# Patient Record
Sex: Male | Born: 1935 | Race: White | Hispanic: No | Smoking: Former smoker
Health system: Southern US, Community
[De-identification: ages and names within clinical notes are randomized; demographics above are authoritative.]

## PROBLEM LIST (undated history)

## (undated) DIAGNOSIS — F329 Major depressive disorder, single episode, unspecified: Secondary | ICD-10-CM

## (undated) DIAGNOSIS — I69328 Other speech and language deficits following cerebral infarction: Secondary | ICD-10-CM

## (undated) DIAGNOSIS — E785 Hyperlipidemia, unspecified: Secondary | ICD-10-CM

## (undated) DIAGNOSIS — R413 Other amnesia: Secondary | ICD-10-CM

## (undated) DIAGNOSIS — I639 Cerebral infarction, unspecified: Secondary | ICD-10-CM

## (undated) DIAGNOSIS — C44319 Basal cell carcinoma of skin of other parts of face: Secondary | ICD-10-CM

## (undated) DIAGNOSIS — M1990 Unspecified osteoarthritis, unspecified site: Secondary | ICD-10-CM

## (undated) DIAGNOSIS — R942 Abnormal results of pulmonary function studies: Secondary | ICD-10-CM

## (undated) DIAGNOSIS — L299 Pruritus, unspecified: Secondary | ICD-10-CM

## (undated) DIAGNOSIS — F4323 Adjustment disorder with mixed anxiety and depressed mood: Secondary | ICD-10-CM

## (undated) DIAGNOSIS — M4802 Spinal stenosis, cervical region: Secondary | ICD-10-CM

## (undated) DIAGNOSIS — I480 Paroxysmal atrial fibrillation: Secondary | ICD-10-CM

## (undated) DIAGNOSIS — I701 Atherosclerosis of renal artery: Secondary | ICD-10-CM

## (undated) DIAGNOSIS — N529 Male erectile dysfunction, unspecified: Secondary | ICD-10-CM

## (undated) DIAGNOSIS — M25569 Pain in unspecified knee: Secondary | ICD-10-CM

## (undated) DIAGNOSIS — E1122 Type 2 diabetes mellitus with diabetic chronic kidney disease: Secondary | ICD-10-CM

## (undated) DIAGNOSIS — Z9289 Personal history of other medical treatment: Secondary | ICD-10-CM

## (undated) DIAGNOSIS — I4510 Unspecified right bundle-branch block: Secondary | ICD-10-CM

## (undated) DIAGNOSIS — I4891 Unspecified atrial fibrillation: Secondary | ICD-10-CM

## (undated) DIAGNOSIS — I10 Essential (primary) hypertension: Secondary | ICD-10-CM

## (undated) DIAGNOSIS — M6281 Muscle weakness (generalized): Secondary | ICD-10-CM

## (undated) DIAGNOSIS — W19XXXA Unspecified fall, initial encounter: Secondary | ICD-10-CM

## (undated) DIAGNOSIS — N182 Chronic kidney disease, stage 2 (mild): Secondary | ICD-10-CM

## (undated) DIAGNOSIS — E871 Hypo-osmolality and hyponatremia: Secondary | ICD-10-CM

## (undated) DIAGNOSIS — E039 Hypothyroidism, unspecified: Secondary | ICD-10-CM

## (undated) DIAGNOSIS — M109 Gout, unspecified: Secondary | ICD-10-CM

## (undated) DIAGNOSIS — Z7901 Long term (current) use of anticoagulants: Secondary | ICD-10-CM

## (undated) DIAGNOSIS — S81809A Unspecified open wound, unspecified lower leg, initial encounter: Secondary | ICD-10-CM

## (undated) DIAGNOSIS — G459 Transient cerebral ischemic attack, unspecified: Secondary | ICD-10-CM

## (undated) DIAGNOSIS — F419 Anxiety disorder, unspecified: Secondary | ICD-10-CM

## (undated) DIAGNOSIS — E079 Disorder of thyroid, unspecified: Secondary | ICD-10-CM

## (undated) DIAGNOSIS — G4733 Obstructive sleep apnea (adult) (pediatric): Secondary | ICD-10-CM

## (undated) DIAGNOSIS — M170 Bilateral primary osteoarthritis of knee: Secondary | ICD-10-CM

## (undated) DIAGNOSIS — I495 Sick sinus syndrome: Secondary | ICD-10-CM

## (undated) DIAGNOSIS — R5383 Other fatigue: Secondary | ICD-10-CM

## (undated) DIAGNOSIS — E1129 Type 2 diabetes mellitus with other diabetic kidney complication: Secondary | ICD-10-CM

## (undated) DIAGNOSIS — R2689 Other abnormalities of gait and mobility: Secondary | ICD-10-CM

## (undated) DIAGNOSIS — R269 Unspecified abnormalities of gait and mobility: Secondary | ICD-10-CM

## (undated) DIAGNOSIS — K648 Other hemorrhoids: Secondary | ICD-10-CM

## (undated) DIAGNOSIS — I5023 Acute on chronic systolic (congestive) heart failure: Secondary | ICD-10-CM

## (undated) DIAGNOSIS — E559 Vitamin D deficiency, unspecified: Secondary | ICD-10-CM

## (undated) DIAGNOSIS — S91009A Unspecified open wound, unspecified ankle, initial encounter: Secondary | ICD-10-CM

## (undated) DIAGNOSIS — T7840XA Allergy, unspecified, initial encounter: Secondary | ICD-10-CM

## (undated) DIAGNOSIS — R011 Cardiac murmur, unspecified: Secondary | ICD-10-CM

## (undated) DIAGNOSIS — Z8679 Personal history of other diseases of the circulatory system: Secondary | ICD-10-CM

## (undated) DIAGNOSIS — Z950 Presence of cardiac pacemaker: Secondary | ICD-10-CM

## (undated) DIAGNOSIS — R351 Nocturia: Secondary | ICD-10-CM

## (undated) DIAGNOSIS — S81009A Unspecified open wound, unspecified knee, initial encounter: Secondary | ICD-10-CM

## (undated) DIAGNOSIS — N401 Enlarged prostate with lower urinary tract symptoms: Secondary | ICD-10-CM

## (undated) DIAGNOSIS — Z5189 Encounter for other specified aftercare: Secondary | ICD-10-CM

## (undated) DIAGNOSIS — E507 Other ocular manifestations of vitamin A deficiency: Secondary | ICD-10-CM

## (undated) DIAGNOSIS — R5381 Other malaise: Secondary | ICD-10-CM

## (undated) DIAGNOSIS — I719 Aortic aneurysm of unspecified site, without rupture: Secondary | ICD-10-CM

## (undated) DIAGNOSIS — I2510 Atherosclerotic heart disease of native coronary artery without angina pectoris: Secondary | ICD-10-CM

## (undated) DIAGNOSIS — E119 Type 2 diabetes mellitus without complications: Secondary | ICD-10-CM

## (undated) DIAGNOSIS — H269 Unspecified cataract: Secondary | ICD-10-CM

## (undated) HISTORY — DX: Cerebral infarction, unspecified: I63.9

## (undated) HISTORY — DX: Abnormal results of pulmonary function studies: R94.2

## (undated) HISTORY — DX: Unspecified fall, initial encounter: W19.XXXA

## (undated) HISTORY — DX: Unspecified right bundle-branch block: I45.10

## (undated) HISTORY — DX: Unspecified osteoarthritis, unspecified site: M19.90

## (undated) HISTORY — DX: Gout, unspecified: M10.9

## (undated) HISTORY — DX: Nocturia: R35.1

## (undated) HISTORY — DX: Male erectile dysfunction, unspecified: N52.9

## (undated) HISTORY — DX: Type 2 diabetes mellitus without complications: E11.9

## (undated) HISTORY — DX: Type 2 diabetes mellitus with other diabetic kidney complication: E11.29

## (undated) HISTORY — DX: Atherosclerosis of renal artery: I70.1

## (undated) HISTORY — DX: Unspecified open wound, unspecified knee, initial encounter: S81.009A

## (undated) HISTORY — DX: Disorder of thyroid, unspecified: E07.9

## (undated) HISTORY — DX: Pain in unspecified knee: M25.569

## (undated) HISTORY — DX: Obstructive sleep apnea (adult) (pediatric): G47.33

## (undated) HISTORY — DX: Other speech and language deficits following cerebral infarction: I69.328

## (undated) HISTORY — DX: Vitamin D deficiency, unspecified: E55.9

## (undated) HISTORY — DX: Acute on chronic systolic (congestive) heart failure: I50.23

## (undated) HISTORY — PX: JOINT REPLACEMENT: SHX530

## (undated) HISTORY — DX: Spinal stenosis, cervical region: M48.02

## (undated) HISTORY — DX: Other hemorrhoids: K64.8

## (undated) HISTORY — DX: Presence of cardiac pacemaker: Z95.0

## (undated) HISTORY — DX: Other fatigue: R53.83

## (undated) HISTORY — PX: INSERT / REPLACE / REMOVE PACEMAKER: SUR710

## (undated) HISTORY — DX: Long term (current) use of anticoagulants: Z79.01

## (undated) HISTORY — DX: Sick sinus syndrome: I49.5

## (undated) HISTORY — DX: Paroxysmal atrial fibrillation: I48.0

## (undated) HISTORY — DX: Anxiety disorder, unspecified: F41.9

## (undated) HISTORY — PX: EYE SURGERY: SHX253

## (undated) HISTORY — DX: Aortic aneurysm of unspecified site, without rupture: I71.9

## (undated) HISTORY — DX: Unspecified cataract: H26.9

## (undated) HISTORY — DX: Muscle weakness (generalized): M62.81

## (undated) HISTORY — DX: Personal history of other medical treatment: Z92.89

## (undated) HISTORY — PX: HERNIA REPAIR: SHX51

## (undated) HISTORY — DX: Hyperlipidemia, unspecified: E78.5

## (undated) HISTORY — DX: Major depressive disorder, single episode, unspecified: F32.9

## (undated) HISTORY — DX: Unspecified atrial fibrillation: I48.91

## (undated) HISTORY — DX: Unspecified open wound, unspecified lower leg, initial encounter: S81.809A

## (undated) HISTORY — DX: Unspecified open wound, unspecified ankle, initial encounter: S91.009A

## (undated) HISTORY — DX: Other amnesia: R41.3

## (undated) HISTORY — DX: Type 2 diabetes mellitus with diabetic chronic kidney disease: E11.22

## (undated) HISTORY — DX: Adjustment disorder with mixed anxiety and depressed mood: F43.23

## (undated) HISTORY — DX: Essential (primary) hypertension: I10

## (undated) HISTORY — DX: Cardiac murmur, unspecified: R01.1

## (undated) HISTORY — DX: Benign prostatic hyperplasia with lower urinary tract symptoms: N40.1

## (undated) HISTORY — DX: Pruritus, unspecified: L29.9

## (undated) HISTORY — DX: Hypothyroidism, unspecified: E03.9

## (undated) HISTORY — DX: Encounter for other specified aftercare: Z51.89

## (undated) HISTORY — DX: Personal history of other diseases of the circulatory system: Z86.79

## (undated) HISTORY — DX: Chronic kidney disease, stage 2 (mild): N18.2

## (undated) HISTORY — DX: Hypo-osmolality and hyponatremia: E87.1

## (undated) HISTORY — DX: Bilateral primary osteoarthritis of knee: M17.0

## (undated) HISTORY — DX: Other ocular manifestations of vitamin A deficiency: E50.7

## (undated) HISTORY — DX: Allergy, unspecified, initial encounter: T78.40XA

## (undated) HISTORY — DX: Other abnormalities of gait and mobility: R26.89

## (undated) HISTORY — DX: Transient cerebral ischemic attack, unspecified: G45.9

## (undated) HISTORY — DX: Unspecified abnormalities of gait and mobility: R26.9

## (undated) HISTORY — DX: Basal cell carcinoma of skin of other parts of face: C44.319

## (undated) HISTORY — DX: Other malaise: R53.81

---

## 1987-03-08 HISTORY — PX: CORONARY ARTERY BYPASS GRAFT: SHX141

## 1997-07-01 ENCOUNTER — Ambulatory Visit (HOSPITAL_COMMUNITY): Admission: RE | Admit: 1997-07-01 | Discharge: 1997-07-01 | Payer: Self-pay | Admitting: Cardiology

## 1998-08-18 ENCOUNTER — Encounter: Payer: Self-pay | Admitting: Oral Surgery

## 1998-08-18 ENCOUNTER — Ambulatory Visit (HOSPITAL_COMMUNITY): Admission: RE | Admit: 1998-08-18 | Discharge: 1998-08-18 | Payer: Self-pay | Admitting: Oral Surgery

## 1998-12-21 ENCOUNTER — Encounter: Admission: RE | Admit: 1998-12-21 | Discharge: 1998-12-21 | Payer: Self-pay | Admitting: Urology

## 1998-12-22 ENCOUNTER — Ambulatory Visit (HOSPITAL_BASED_OUTPATIENT_CLINIC_OR_DEPARTMENT_OTHER): Admission: RE | Admit: 1998-12-22 | Discharge: 1998-12-22 | Payer: Self-pay | Admitting: Urology

## 2000-07-27 ENCOUNTER — Ambulatory Visit (HOSPITAL_COMMUNITY): Admission: RE | Admit: 2000-07-27 | Discharge: 2000-07-27 | Payer: Self-pay | Admitting: Cardiology

## 2000-07-27 ENCOUNTER — Encounter: Payer: Self-pay | Admitting: Cardiology

## 2000-08-02 ENCOUNTER — Encounter: Payer: Self-pay | Admitting: Cardiology

## 2000-08-02 ENCOUNTER — Encounter: Admission: RE | Admit: 2000-08-02 | Discharge: 2000-08-02 | Payer: Self-pay | Admitting: Cardiology

## 2000-08-08 ENCOUNTER — Ambulatory Visit (HOSPITAL_COMMUNITY): Admission: RE | Admit: 2000-08-08 | Discharge: 2000-08-08 | Payer: Self-pay | Admitting: Cardiology

## 2000-09-04 ENCOUNTER — Inpatient Hospital Stay (HOSPITAL_COMMUNITY): Admission: RE | Admit: 2000-09-04 | Discharge: 2000-09-11 | Payer: Self-pay | Admitting: Orthopedic Surgery

## 2000-09-06 ENCOUNTER — Encounter: Payer: Self-pay | Admitting: Orthopedic Surgery

## 2000-09-10 ENCOUNTER — Encounter: Payer: Self-pay | Admitting: Orthopedic Surgery

## 2000-10-03 DIAGNOSIS — N529 Male erectile dysfunction, unspecified: Secondary | ICD-10-CM

## 2000-10-03 HISTORY — DX: Male erectile dysfunction, unspecified: N52.9

## 2001-05-07 ENCOUNTER — Encounter: Admission: RE | Admit: 2001-05-07 | Discharge: 2001-05-07 | Payer: Self-pay | Admitting: Cardiology

## 2001-05-07 ENCOUNTER — Encounter: Payer: Self-pay | Admitting: Cardiology

## 2001-07-26 ENCOUNTER — Emergency Department (HOSPITAL_COMMUNITY): Admission: EM | Admit: 2001-07-26 | Discharge: 2001-07-26 | Payer: Self-pay | Admitting: Emergency Medicine

## 2003-02-05 DIAGNOSIS — M1990 Unspecified osteoarthritis, unspecified site: Secondary | ICD-10-CM

## 2003-02-05 HISTORY — DX: Unspecified osteoarthritis, unspecified site: M19.90

## 2004-04-28 DIAGNOSIS — N401 Enlarged prostate with lower urinary tract symptoms: Secondary | ICD-10-CM | POA: Insufficient documentation

## 2004-04-28 DIAGNOSIS — N138 Other obstructive and reflux uropathy: Secondary | ICD-10-CM

## 2004-04-28 HISTORY — DX: Other obstructive and reflux uropathy: N13.8

## 2004-11-03 DIAGNOSIS — M109 Gout, unspecified: Secondary | ICD-10-CM

## 2004-11-03 DIAGNOSIS — E785 Hyperlipidemia, unspecified: Secondary | ICD-10-CM

## 2004-11-03 DIAGNOSIS — I2510 Atherosclerotic heart disease of native coronary artery without angina pectoris: Secondary | ICD-10-CM | POA: Insufficient documentation

## 2004-11-03 HISTORY — DX: Hyperlipidemia, unspecified: E78.5

## 2004-11-03 HISTORY — DX: Gout, unspecified: M10.9

## 2004-11-24 DIAGNOSIS — E119 Type 2 diabetes mellitus without complications: Secondary | ICD-10-CM

## 2004-11-24 DIAGNOSIS — E1159 Type 2 diabetes mellitus with other circulatory complications: Secondary | ICD-10-CM | POA: Insufficient documentation

## 2004-11-24 HISTORY — DX: Type 2 diabetes mellitus without complications: E11.9

## 2004-12-15 DIAGNOSIS — M4802 Spinal stenosis, cervical region: Secondary | ICD-10-CM

## 2004-12-15 HISTORY — DX: Spinal stenosis, cervical region: M48.02

## 2006-03-07 HISTORY — PX: CORONARY ANGIOPLASTY WITH STENT PLACEMENT: SHX49

## 2006-03-16 DIAGNOSIS — R350 Frequency of micturition: Secondary | ICD-10-CM

## 2006-03-16 DIAGNOSIS — I5023 Acute on chronic systolic (congestive) heart failure: Secondary | ICD-10-CM

## 2006-03-16 HISTORY — DX: Acute on chronic systolic (congestive) heart failure: I50.23

## 2006-04-19 DIAGNOSIS — I4510 Unspecified right bundle-branch block: Secondary | ICD-10-CM

## 2006-04-19 HISTORY — DX: Unspecified right bundle-branch block: I45.10

## 2006-09-12 ENCOUNTER — Encounter: Admission: RE | Admit: 2006-09-12 | Discharge: 2006-09-12 | Payer: Self-pay | Admitting: Cardiology

## 2006-09-19 ENCOUNTER — Ambulatory Visit (HOSPITAL_COMMUNITY): Admission: RE | Admit: 2006-09-19 | Discharge: 2006-09-20 | Payer: Self-pay | Admitting: Cardiology

## 2006-10-18 DIAGNOSIS — I701 Atherosclerosis of renal artery: Secondary | ICD-10-CM

## 2006-10-18 DIAGNOSIS — E559 Vitamin D deficiency, unspecified: Secondary | ICD-10-CM

## 2006-10-18 DIAGNOSIS — M25519 Pain in unspecified shoulder: Secondary | ICD-10-CM

## 2006-10-18 HISTORY — DX: Atherosclerosis of renal artery: I70.1

## 2006-10-18 HISTORY — DX: Vitamin D deficiency, unspecified: E55.9

## 2006-10-19 ENCOUNTER — Encounter (HOSPITAL_COMMUNITY): Admission: RE | Admit: 2006-10-19 | Discharge: 2007-01-17 | Payer: Self-pay | Admitting: Cardiology

## 2007-01-18 ENCOUNTER — Encounter (HOSPITAL_COMMUNITY): Admission: RE | Admit: 2007-01-18 | Discharge: 2007-03-07 | Payer: Self-pay | Admitting: Cardiology

## 2008-07-23 DIAGNOSIS — G4730 Sleep apnea, unspecified: Secondary | ICD-10-CM

## 2008-07-23 DIAGNOSIS — G4733 Obstructive sleep apnea (adult) (pediatric): Secondary | ICD-10-CM

## 2008-07-23 HISTORY — DX: Obstructive sleep apnea (adult) (pediatric): G47.33

## 2008-12-05 DIAGNOSIS — Z9289 Personal history of other medical treatment: Secondary | ICD-10-CM

## 2008-12-05 HISTORY — DX: Personal history of other medical treatment: Z92.89

## 2009-01-19 DIAGNOSIS — R55 Syncope and collapse: Secondary | ICD-10-CM

## 2009-01-26 ENCOUNTER — Encounter: Admission: RE | Admit: 2009-01-26 | Discharge: 2009-01-26 | Payer: Self-pay | Admitting: Neurology

## 2009-02-28 ENCOUNTER — Inpatient Hospital Stay (HOSPITAL_COMMUNITY): Admission: EM | Admit: 2009-02-28 | Discharge: 2009-03-02 | Payer: Self-pay | Admitting: Emergency Medicine

## 2009-03-05 DIAGNOSIS — F329 Major depressive disorder, single episode, unspecified: Secondary | ICD-10-CM

## 2009-03-05 DIAGNOSIS — F419 Anxiety disorder, unspecified: Secondary | ICD-10-CM | POA: Insufficient documentation

## 2009-03-05 HISTORY — DX: Major depressive disorder, single episode, unspecified: F32.9

## 2009-03-07 HISTORY — PX: PACEMAKER INSERTION: SHX728

## 2009-07-01 DIAGNOSIS — J329 Chronic sinusitis, unspecified: Secondary | ICD-10-CM | POA: Insufficient documentation

## 2009-12-24 ENCOUNTER — Inpatient Hospital Stay (HOSPITAL_COMMUNITY): Admission: EM | Admit: 2009-12-24 | Discharge: 2009-12-26 | Payer: Self-pay | Admitting: Emergency Medicine

## 2009-12-24 DIAGNOSIS — C44319 Basal cell carcinoma of skin of other parts of face: Secondary | ICD-10-CM

## 2009-12-24 HISTORY — DX: Basal cell carcinoma of skin of other parts of face: C44.319

## 2010-03-18 DIAGNOSIS — Z950 Presence of cardiac pacemaker: Secondary | ICD-10-CM | POA: Insufficient documentation

## 2010-03-18 HISTORY — DX: Presence of cardiac pacemaker: Z95.0

## 2010-05-19 LAB — CK TOTAL AND CKMB (NOT AT ARMC)
CK, MB: 2.6 ng/mL (ref 0.3–4.0)
CK, MB: 2.9 ng/mL (ref 0.3–4.0)
CK, MB: 3.2 ng/mL (ref 0.3–4.0)
Relative Index: 2.8 — ABNORMAL HIGH (ref 0.0–2.5)
Relative Index: INVALID (ref 0.0–2.5)
Total CK: 96 U/L (ref 7–232)

## 2010-05-19 LAB — DIFFERENTIAL
Basophils Absolute: 0 10*3/uL (ref 0.0–0.1)
Basophils Relative: 1 % (ref 0–1)
Eosinophils Absolute: 0.2 10*3/uL (ref 0.0–0.7)
Eosinophils Relative: 3 % (ref 0–5)
Monocytes Absolute: 0.9 10*3/uL (ref 0.1–1.0)
Monocytes Relative: 15 % — ABNORMAL HIGH (ref 3–12)

## 2010-05-19 LAB — CBC
HCT: 39.3 % (ref 39.0–52.0)
HCT: 41.7 % (ref 39.0–52.0)
Hemoglobin: 13.8 g/dL (ref 13.0–17.0)
MCH: 31.6 pg (ref 26.0–34.0)
MCH: 32 pg (ref 26.0–34.0)
MCHC: 35.1 g/dL (ref 30.0–36.0)
MCHC: 35.2 g/dL (ref 30.0–36.0)
MCHC: 35.3 g/dL (ref 30.0–36.0)
MCV: 89.7 fL (ref 78.0–100.0)
MCV: 90.9 fL (ref 78.0–100.0)
MCV: 91.2 fL (ref 78.0–100.0)
Platelets: 206 10*3/uL (ref 150–400)
RDW: 13.1 % (ref 11.5–15.5)
RDW: 13.4 % (ref 11.5–15.5)
RDW: 13.4 % (ref 11.5–15.5)
WBC: 6.7 10*3/uL (ref 4.0–10.5)

## 2010-05-19 LAB — PROTIME-INR
INR: 1.85 — ABNORMAL HIGH (ref 0.00–1.49)
INR: 1.95 — ABNORMAL HIGH (ref 0.00–1.49)
Prothrombin Time: 21.5 seconds — ABNORMAL HIGH (ref 11.6–15.2)
Prothrombin Time: 22.4 seconds — ABNORMAL HIGH (ref 11.6–15.2)

## 2010-05-19 LAB — URINALYSIS, ROUTINE W REFLEX MICROSCOPIC
Bilirubin Urine: NEGATIVE
Glucose, UA: NEGATIVE mg/dL
Hgb urine dipstick: NEGATIVE
Ketones, ur: NEGATIVE mg/dL
Protein, ur: NEGATIVE mg/dL
pH: 7 (ref 5.0–8.0)

## 2010-05-19 LAB — COMPREHENSIVE METABOLIC PANEL
Albumin: 3.5 g/dL (ref 3.5–5.2)
Alkaline Phosphatase: 57 U/L (ref 39–117)
BUN: 15 mg/dL (ref 6–23)
Chloride: 97 mEq/L (ref 96–112)
Creatinine, Ser: 1.03 mg/dL (ref 0.4–1.5)
Glucose, Bld: 92 mg/dL (ref 70–99)
Potassium: 4.4 mEq/L (ref 3.5–5.1)
Total Bilirubin: 0.7 mg/dL (ref 0.3–1.2)

## 2010-05-19 LAB — HEMOCCULT GUIAC POC 1CARD (OFFICE): Fecal Occult Bld: NEGATIVE

## 2010-05-19 LAB — T4, FREE: Free T4: 1.47 ng/dL (ref 0.80–1.80)

## 2010-05-19 LAB — MRSA PCR SCREENING: MRSA by PCR: NEGATIVE

## 2010-06-07 LAB — LIPID PANEL
HDL: 43 mg/dL (ref >39)
Total CHOL/HDL Ratio: 3.6 RATIO
Triglycerides: 111 mg/dL (ref <150)
VLDL: 22 mg/dL (ref 0–40)

## 2010-06-07 LAB — CK TOTAL AND CKMB (NOT AT ARMC)
CK, MB: 2.8 ng/mL (ref 0.3–4.0)
CK, MB: 5.2 ng/mL — ABNORMAL HIGH (ref 0.3–4.0)
Total CK: 119 U/L (ref 7–232)
Total CK: 139 U/L (ref 7–232)
Total CK: 92 U/L (ref 7–232)

## 2010-06-07 LAB — PROTIME-INR: Prothrombin Time: 24.5 seconds — ABNORMAL HIGH (ref 11.6–15.2)

## 2010-06-07 LAB — CBC
HCT: 45.5 % (ref 39.0–52.0)
MCHC: 34.4 g/dL (ref 30.0–36.0)
MCHC: 34.5 g/dL (ref 30.0–36.0)
MCHC: 35 g/dL (ref 30.0–36.0)
MCV: 93.4 fL (ref 78.0–100.0)
MCV: 93.7 fL (ref 78.0–100.0)
Platelets: 219 10*3/uL (ref 150–400)
Platelets: 245 10*3/uL (ref 150–400)
RBC: 4.42 MIL/uL (ref 4.22–5.81)
RBC: 4.74 MIL/uL (ref 4.22–5.81)
RDW: 12.4 % (ref 11.5–15.5)
RDW: 13 % (ref 11.5–15.5)
WBC: 5.8 10*3/uL (ref 4.0–10.5)

## 2010-06-07 LAB — BASIC METABOLIC PANEL
BUN: 15 mg/dL (ref 6–23)
CO2: 26 mEq/L (ref 19–32)
CO2: 28 mEq/L (ref 19–32)
Calcium: 8.9 mg/dL (ref 8.4–10.5)
Chloride: 103 mEq/L (ref 96–112)
Creatinine, Ser: 0.9 mg/dL (ref 0.4–1.5)
Creatinine, Ser: 1.03 mg/dL (ref 0.4–1.5)
GFR calc Af Amer: >60 mL/min (ref >60)
Glucose, Bld: 91 mg/dL (ref 70–99)

## 2010-06-07 LAB — POCT I-STAT, CHEM 8
Calcium, Ion: 1.21 mmol/L (ref 1.12–1.32)
Creatinine, Ser: 1.2 mg/dL (ref 0.4–1.5)
Glucose, Bld: 117 mg/dL — ABNORMAL HIGH (ref 70–99)
Hemoglobin: 15.6 g/dL (ref 13.0–17.0)
Potassium: 4.3 mEq/L (ref 3.5–5.1)

## 2010-06-07 LAB — POCT CARDIAC MARKERS: CKMB, poc: 1.9 ng/mL (ref 1.0–8.0)

## 2010-07-20 NOTE — Cardiovascular Report (Signed)
NAMEJAYVIN, Jon [name]                 ACCOUNT NO.:  [id number]   MEDICAL RECORD NO.:  [id number]          PATIENT TYPE:  OIB   LOCATION:  6531                         FACILITY:  [facility]   PHYSICIAN:  Thereasa Solo. Little, M.D. DATE OF BIRTH:  08/16/1921   DATE OF PROCEDURE:  09/19/2006  DATE OF DISCHARGE:                            CARDIAC CATHETERIZATION   INDICATIONS FOR TEST:  This 77 year old male had bypass surgery in 1989.  In 2005, he had a Cardiolite study that showed moderate ischemia but was  asymptomatic and at that time did not want any type of additional follow-  up.  He presented in May of 2008 with mild congestive heart failure.  This was aggressively treated and the failure has resolved.  He is back  to being asymptomatic; however, in view of the new onset of CHF and a  known ischemic nuclear study, the patient was brought in for left heart  catheterization and graft visualization.   PROCEDURE:  After obtaining informed consent, the patient was prepped  and draped in the usual sterile fashion exposing the right groin.  Following local anesthetic with 1% Xylocaine, the Seldinger technique  was employed and a 5-French introducer sheath was placed in the right  femoral artery.  Left and right coronary arteriography, graft  visualization, ventriculography in the RAO projection and aortic root  injection was performed.  A distal aortogram and percutaneous  intervention to the saphenous vein graft to the OM was performed.   COMPLICATIONS:  None   TOTAL CONTRAST USED:  180 cc.   After aspirating the 6-French guide catheter which was used for  intervention, a lot of atheromatous material was noted.   RESULTS:  1. Hemodynamic monitoring:  Central aortic pressure was 138/57.  Left      ventricular pressure was 132/4.  No significant gradient was noted      at the time of pullback.  2. Ventriculography in the RAO projection revealed the posterior      basilar segment to be  severely hypokinetic.  The remainder of the      segments appeared to be normal, and there was mild dilatation of      the LV cavity.  The ejection fraction was 40-45% with no      significant mitral regurgitation being seen.  The end-diastolic      pressure was 16.  3. Aortic root:  Aortic root injection showed no evidence of aortic      insufficiency and only a single vein graft was noted.  4. Distal aortogram:  A distal aortogram was performed because on      fluoroscopy a rim of calcification was noted just above the      bifurcation.  This revealed left proximal renal artery stenosis of      about 70%, no evidence of right renal artery stenosis, and there is      a small (about 3-3.5 cm) infrarenal aneurysm.  This will require a      Doppler study to delineate the exact size.  The iliacs had proximal  dilatation but no high-grade stenosis was noted.  5. Coronary arteriography.  There was dense calcification of the left      main, left anterior descending and proximal circumflex.   1. Left main small without high-grade stenosis.  2. LAD:  The LAD was 100% occluded after a small septal perforator.  3. Circumflex 100% occluded proximally.  4. Right coronary artery.  The right coronary artery was a nondominant      vessel, but there what appeared be an atrial branch that gave      collaterals to both the intermediate and the diagonal, and there      was retrograde filling of the saphenous vein graft to the diagonal,      and there was contrast hang up in the distal portion of this graft.   1. Saphenous vein graft sequentially to OM-1 and OM-2.  The proximal      graft had a 14 mm long 80-90% area of obstruction.  The remainder      of the graft was widely patent.  OM-1 and OM-2 were free of      disease.  OM-2 was the larger of the 2 vessels.  There was      collateral filling of the diagonals.  2. Saphenous vein graft sequentially to the intermediate diagonal was      noted to  be 100% occluded in 2002 and was not reevaluated.  3. Internal mammary artery to LAD.  The internal mammary artery was      widely patent.  It inserted to the midportion of the LAD.  The LAD      itself small with mid 40-50% areas of narrowing.  There was no high-      grade focal area that needed intervention.   Because of the high-grade stenosis noted in the saphenous vein graft to  the OM, arrangements were made for intervention.  The patient was given  Angiomax.  Once an appropriate ACT was reached, intervention was  undertaken.  A 6-French left coronary bypass guide catheter and a short  Luge wire was used.  The wire was easily placed down the distal vessel,  and a 3.5 x 20 Taxus stent was placed in such a manner that both the  proximal and distal portion were well overlapped with stent.  The  initial deployment was 12 atmospheres for 46 seconds with the final  inflation being 15 atmospheres for 47 seconds.   Post dilatation of the stent with a 4.0 x 15 Quantum balloon was  performed using 16 atmospheres for 53 seconds which took the stent to be  4.1 mm.  Both the proximal and distal margins appeared to be nicely  apposed, in fact were slightly overexpanded.  There were minimal  irregularities within the stent, however.   The patient was given 600 mg of oral Plavix.   There was no evidence of any distal embolization of thrombus formation  in the OM system.   The patient will be monitored overnight with careful attention to his  renal functions because of the contrast.   He will need outpatient Dopplers to evaluate his renal artery, but  particularly the abdominal aortic aneurysm.           ______________________________  Thereasa Solo Little, M.D.     ABL/MEDQ  D:  09/19/2006  T:  09/19/2006  Job:  [id number]   cc:   Lenon Curt. Chilton Si, M.D.  Cath Lab

## 2010-07-20 NOTE — Discharge Summary (Signed)
NAMELAWRNCE, REYEZ NO.:  [id number]   MEDICAL RECORD NO.:  [id number]          PATIENT TYPE:  OIB   LOCATION:  6531                         FACILITY:  [facility]   PHYSICIAN:  Lezlie Octave, N.P.     DATE OF BIRTH:  Aug 12, 1921   DATE OF ADMISSION:  09/19/2006  DATE OF DISCHARGE:                               DISCHARGE SUMMARY   Jon [name] is an 77 year old white male patient of Dr. Julieanne Manson who  came into the hospital on September 19, 2006 for elective cardiac  catheterization.  He apparently recently had an episode of congestive  heart failure.  He has had a history of coronary artery disease in 1989,  he underwent coronary bypass grafting.  In 2005, he had a positive  moderate ischemic Cardiolite test.  He had been treated and seen by Dr.  Clarene Duke on Aug 03, 2006 and August 30, 2006.  It was decided he should  undergo cardiac catheterization, thus he was brought in for cath.  This  was performed and it revealed that he had progressive disease in his SVG  to his OM1-2.  On proximal graft portion, he had 80-90%, 14 mm in  length.  The remainder of the graft was okay.  SVG to his IM and  diagonal was 100%.  This was old finding from 2002 cath.  His LIMA to  his LAD was patent.  His LV EF was 40-45%.  He subsequently went on to  have stenting with a 3.5 x 20 Taxus stent to his graft of his SVG to his  OM 1 and 2.  The following day, he was seen by Dr. Clarene Duke.  His labs  were appropriate without any elevation CK-MB or troponin.  He had no  bruise at the cath site.  His blood pressure was stable at 122/57.  He  was considered stable for discharge home.  Dr. Clarene Duke did recommend  cardiac rehab starting in mid August.  He also discussed with him  activity, diet and risk factor reduction.  The decision was made to  discontinue his Demadex.   LABORATORIES:  Hemoglobin was 3.7.  Sodium was 133, BUN was 13,  creatinine 0.88.  CK-MB and troponin were negative.   EKG showed right  bundle branch block which is a normal finding.   He apparently also on his cath was noted to have renal artery stenosis  and abdominal aortic aneurysm and 70% in his left and Dr. Clarene Duke  recommended outpatient abdominal and renal ultrasound.   DISCHARGE MEDICATIONS:  1. Aspirin 325 mg a day.  2. Plavix 75 mg a day.  He was found to stop it for 2 years.  3. Allopurinol 50 mg a day.  4. Synthroid 50 mcg a day.  5. Atenolol 25 mg a day.  6. Norvasc 2.5 mg a day.  7. Crestor 10 mg a day.  8. Lorazepam as needed as he took prior to his hospitalization or no      more than 0.5 mg every 6 hours.  9. CPAP as used before.  10.Nitroglycerin p.r.n. as  needed for chest pain one every 5 minutes      x3.   Followup includes abdominal and renal Dopplers July 31 at 1:00 p.m. and  follow up with Dr. Clarene Duke August 5 at 9:00 a.m.   He should be on a cardiac low-fat diet.   He could start cardiac rehab in mid August and he should increase is  activity slowly and no strenuous activity, pushing, pulling, lifting,  etc. x1 and no driving for 2 days.   DISCHARGE DIAGNOSES:  1. Unstable angina/episode of congestive heart failure.  2. Progressive coronary artery disease status post cardiac      catheterization with stenting to his saphenous vein graft to his      obtuse marginal 1 and 2 with a Taxus stent.  3. Ischemic cardiomyopathy with an ejection fraction of 40-45%.  4. Left atherosclerotic peripheral vascular disease with left renal      artery stenosis and abdominal aortic aneurysm.  5. Hypertension.  6. Hyperlipidemia.  7. Hyperthyroidism.  8. History of gout.      Lezlie Octave, N.P.     BB/MEDQ  D:  09/20/2006  T:  09/20/2006  Job:  [id number]   cc:   Lenon Curt. Chilton Si, M.D.

## 2010-07-23 NOTE — H&P (Signed)
[facility]  Patient:    Jon [name], Jon [name]                       MRN: [id number] Adm. Date:  09/04/00 Attending:  Ollen Gross, M.D. Dictator:   Alexzandrew L. Julien Girt, P.A.-C. CC:         Thereasa Solo. Little, M.D.   History and Physical  CHIEF COMPLAINT:  Bilateral knee pain, right more symptomatic than left.  HISTORY OF PRESENT ILLNESS:  Patient is a 77 year old male with bilateral knee pain who was initially seen and evaluated by Dr. Trellis Paganini.  Was referred over to the care of Dr. Ollen Gross for continued knee pain.  Right knee has been more symptomatic than the left for some time now.  He has been seen and treated conservatively for his knee pain for several years now.  The pain has become progressively worse to the point where it has interfered with his daily activities and also interfered with his golfing.  He presents now for evaluation and possible consideration of a total knee replacement.  Patient does have significant heart disease with coronary artery disease and also previous CABG.  He has been seen and evaluated by Dr. Caprice Kluver and has recently undergone a Persantine cardiac stress test.  His workup has been completed and patient has been cleared for his elective knee surgery by Dr. Clarene Duke.  Risks and benefits discussed and he has elected to proceed with surgery.  ALLERGIES:  No known drug allergies.  CURRENT MEDICATIONS:  1. Synthroid 50 mcg one p.o. q.d.  2. Allopurinol 100 mg q.d.  3. Atenolol 25 mg q.d.  4. Norvasc 5 mg q.d.  5. Zocor 20 mg q.d.  6. Celexa 20 mg one-half tablet q.d.  7. Lasix 40 mg q.d.  8. Mobic 7.5 mg one to two q.d.  9. Coated aspirin 325 mg q.d. 10. Ultracet one q.i.d. p.r.n. 11. Chloridia clidinium capsules one up to q.i.d. p.r.n. 12. Hydroxyzine 25 mg q.d. 13. Topical cream p.r.n.  PAST MEDICAL HISTORY:  Significant for hypertension, coronary arterial disease, carotid arterial disease.  PAST SURGICAL  HISTORY:  Tonsillectomy 1930, right elbow surgery 1944, abdominal hernia repair x 2 right side done in 1960 and the left side in 1984, carotid endarterectomy on the right in 1983, detached retinal procedure in 1994, left cataract surgery with lens in 1997, right cataract surgery with lens in 1999, dental implants x 3 in 2000, hydrocele removal in the scrotum in 2000, right knee arthroscopic procedure in 2000, five vessel heart bypass in 1989.  SOCIAL HISTORY:  He is married.  Has a daughter who is a physical therapist who accompanies him on the visit today.  FAMILY HISTORY:  Mother deceased age 55 with a history of diabetes, peripheral vascular disease.  Father deceased age 3 with history of aspiration pneumonia.  REVIEW OF SYSTEMS:  GENERAL:  No fevers, chills, night sweats.  NEUROLOGIC: No seizures, syncope, paralysis.  RESPIRATORY:  No shortness of breath, productive cough, hemoptysis.  CARDIOVASCULAR:  No chest pain, angina, orthopnea.  GASTROINTESTINAL:  No nausea, vomiting, diarrhea, constipation. GENITOURINARY:  No dysuria, hematuria, or discharge.  Patient does state he has some enlarged prostate or boggy prostate, however, does not give any complaints of urinary problems.  MUSCULOSKELETAL:  Pertinent to the right knee found in the history of present illness.  PHYSICAL EXAMINATION  VITAL SIGNS:  Pulse 56, respirations 16, blood pressure 138/74.  GENERAL:  Patient is a  77 year old white male well-nourished, well-developed, appears to be in no acute distress.  He is alert and oriented, cooperative at time of examination.  He is accompanied by his family.  Appears to be an excellent historian.  HEENT:  Normocephalic, atraumatic.  Pupils round and reactive.  Patient is noted to wear glasses.  EOMs are intact.  Oropharynx is clear.  NECK:  Supple.  Previous carotid endarterectomy scar on the right well healed. Good bilateral carotid upstrokes.  No bruits are  appreciated.  CHEST:  Clear to auscultation anterior and posterior.  HEART:  Regular rhythm.  No rubs, thrills, palpitations, or murmur are appreciated.  There is a split S1, normal S2.  ABDOMEN:  Soft, slightly round, nontender.  Bowel sounds are present.  No rebound or guarding.  RECTAL:  Not done.  Not pertinent to present illness.  BREASTS:  Not done.  Not pertinent to present illness.  GENITALIA:  Not done.  Not pertinent to present illness.  EXTREMITIES:  Right lower extremity:  He has range of motion of 10 degrees to 120 degrees of flexion.  No instability.  Palpable pulses noted.  He has previous vein harvesting bypass scars on the right leg.  He does walk with somewhat of an antalgic gait.  IMPRESSION: 1. Osteoarthritis, right knee. 2. Hypertension. 3. Coronary artery disease. 4. Carotid arterial disease. 5. Status post coronary artery bypass grafting five vessels. 6. Status post carotid endarterectomy. 7. History of gout. 8. Hypothyroidism.  PLAN:  Patient will be admitted to [facility] to undergo right total knee replacement arthroplasty.  Surgery will be performed by Dr. Ollen Gross.  Patient has been cleared by Dr. Caprice Kluver.  Dr. Clarene Duke will be notified of the room number and admission following surgery and will be consulted if we need any medical assistance with this patient throughout the hospital course. DD:  08/30/00 TD:  08/30/00 Job: [id number]

## 2010-07-23 NOTE — Op Note (Signed)
[facility]  Patient:    Jon [name], Jon [name]                        MRN: [id number] Proc. Date: 09/04/00 Adm. Date:  [id number] Disc. Date: [id number] Attending:  Loreli Dollar                           Operative Report  PREOPERATIVE DIAGNOSIS:  Osteoarthritis, right knee.  POSTOPERATIVE DIAGNOSIS:  Osteoarthritis, right knee.  PROCEDURE:  Right total knee arthroplasty.  SURGEON:  Ollen Gross, M.D.  ASSISTANT:  Alexzandrew L. Perkins, P.A.-C.  ANESTHESIA:  General.  ESTIMATED BLOOD LOSS:  Minimal.  DRAINS:  Hemovac x 1.  COMPLICATIONS:  None.  TOURNIQUET TIME:  52 minute at 350 mmHg.  CONDITION:  Stable to recovery.  BRIEF CLINICAL NOTE:  Jon [name] is a 77 year old male with severe osteoarthritis of both knees, right currently more symptomatic than the left. He has failed nonoperative management and presents now for right total knee arthroplasty.  DESCRIPTION OF PROCEDURE:  After the successful administration of general anesthetic, the tourniquet was placed high on the right thigh and the right lower extremity prepped and draped in the usual sterile fashion.  Extremity was wrapped in an Esmarch, the knee flexed, and the tourniquet inflated to 350 mmHg.  Standard midline incision was made, skin cut with a 10 blade, subcutaneous tissue to the level of the extensor mechanism.  A fresh blade was used to make a medial parapatellar arthrotomy.  Soft tissue of the proximal medial tibia subperiosteally elevated to the joint line with a knife into the semimembranosus bursa with a curved osteotome.  Soft tissue of the proximal lateral tibia was also elevated with attention being paid to avoid the patellar tendon on the tibial tubercle.  The lateral patellofemoral ligament is cute, patella everted, and knee flexed 90 degrees.  ACL and PCL were then removed.  A drill was used to create a starting hole in the distal femur, canal is irrigated, and a  5 degree right valgus alignment guide is placed.  Referencing off the posterior condyles, rotation is marked and the block pinned so as to remove 9 mm off the distal femur.  Distal femoral resection is made with an oscillating saw.  A sizing block is placed, and size 5 is most appropriate. Referencing off the posterior condyles, the rotation is marked, and this corresponds with the epicondylar axis.  The size 5 AP cutting block is placed, and the anterior cut and posterior cuts are made.  Tibia subluxed forward and the menisci removed.  The extramedullary tibial alignment guide is placed, referencing proximally at the medial third of the tibial tubercle and distally along the second metatarsal axis and tibial crest.  The block is then pinned to remove 10 mm off the nondeficient lateral side.  Tibial resection is then made with an oscillating saw.  Size 5 was the most appropriate-sized tibia.  Intercondylar and chamfer block is placed on the distal femur and the intercondylar and chamfer cuts made.  A trial size 5 posterior-stabilized femur, size 5 rotating platform tibial tray, and a size 5, 10 mm posterior-stabilized rotating platform insert are placed.  Full extension is achieved with excellent varus and valgus balance down past 100 degrees of flexion.  The patella was then everted, thickness measured to be 25.5, and free-hand resection taken down to 14 mm.  The 41 mm template is  placed on the patella, and the lug holes are drilled.  Trial patella is placed and tracks normally.  The proximal tibia is prepared with the keel punch and modular drill and then osteophytes removed on the posterior femur with a trial in place.  The cut bone surfaces are then prepared with pulsatile lavage and cement mixed.  Once ready for implantation, the size 5 tibial tray, size 5 posterior-stabilized femur, 41 mm patella are cemented into place and the patella is held with a clamp.  The trial 10 mm  posterior-stabilized rotating platform insert is placed, knee held in full extension, all extruded cement removed.  Once the cement is fully hardened, then the permanent posterior-stabilized 10 mm rotating platform is inserted, placed into the tibial tray.  The wound is then copiously irrigated with antibiotic solution and the extensor mechanism closed over a Hemovac drain with interrupted PDS.  Subcutaneous tissue is closed with interrupted 2-0 Vicryl, subcuticular with running 4-0 Monocryl.  The incision is clean and dry and a bulky sterile dressing applied.  The drain is hooked to suction.  The patient awakened and transported to recovery in stable condition. DD:  09/04/00 TD:  09/04/00 Job: [id number]

## 2010-07-23 NOTE — Discharge Summary (Signed)
[facility]  Patient:    Jon [name], Jon [name]                        MRN: [id number] Adm. Date:  [id number] Disc. Date: [id number] Attending:  Loanne Drilling Dictator:   Irena Cords, P.A.-C. CC:         Julieanne Manson, M.D.   Discharge Summary  PRIMARY DIAGNOSES: 1. Osteoarthritis, right knee. 2. Hyponatremia. 3. Mild congestive heart failure.  SECONDARY DIAGNOSES: 1. Hypertension. 2. Coronary artery disease. 3. Carotid arterial disease. 4. Status post coronary artery bypass grafting of five vessels. 5. Status post carotid endarterectomy. 6. History of gout. 7. Hypothyroidism.  SURGICAL PROCEDURES:  Right total knee arthroplasty by Ollen Gross, M.D., with the assistance of Alexzandrew L. Julien Girt, P.A.-C., on September 04, 2000. Please see the operative summary for further details.  CONSULTATIONS:  Cardiology and internal medicine.  LABORATORY DATA:  The preoperative white count was 6.8 with H&H of 14.8 and 42.7.  He stayed fairly stable postoperatively and was 10.6 and 30.0, respectively, on September 07, 2000, for his H&H.  The preoperative PT and INR were 13.7 and 1.1, respectively.  Prior to discharge, he was 23.4 and 2.7 on Coumadin.  BMET preoperatively showed a low sodium of 127, which improved to 131 prior to discharge.  His potassium stayed stable at 4.2, dropping to a low of 3.6.  The BUN and creatinine were also stable, ranging from 12-17 and a creatinine of 0.9-1.  Cardiac enzymes showed a troponin I of 2.46 and a relative index of 2.9 on September 07, 2000, and then a troponin I of 2.03 on September 08, 2000.  His CK-MB was within normal limits at all times, 3.6 and 2.7, respectively.  The TSH was 2.005.  The urinalysis preoperatively was within normal limits, all negative.  He had a venous ultrasound of the lower extremities on September 08, 2000, which showed no evidence of a lower extremity DVT.  The preoperative EKG showed a normal sinus rhythm and  nonspecific ST-T wave abnormalities and a prolonged QT on September 08, 2000.  The chest x-ray on September 06, 2000, showed mild CHF and right lower lobe atelectasis or infiltrate and some bilateral effusions.  On September 10, 2000, there was no evidence of edema and no active cardiopulmonary disease radiographically apparent.  CHIEF COMPLAINT:  Bilateral knee pain, right more symptomatic that left.  HISTORY OF PRESENT ILLNESS:  Jon [name] is a 77 year old male who has had bilateral knee pain for quite some time.  He was initially seen by Remi Deter A. Grant Ruts., M.D., and then referred to Ollen Gross, M.D., for continued care. The patient has had worsening symptoms such that they were starting to interfere with his daily activities.  He had tried conservative measures without improvement.  The patient was seen by Julieanne Manson, M.D., preoperatively and underwent a Persantine Cardiolite stress test which was negative.  The patient was cleared for surgery.  After discussion of the risks, benefits, and alternatives of surgery, the patient elected to undergo the surgical intervention.  Preoperative laboratory studies and signed surgical consent were obtained.  The patient was admitted on September 04, 2000, for surgical intervention.  HOSPITAL COURSE:  Following the surgical procedure, the patient was taken to the PACU in stable condition and transferred to the orthopedic floor in good condition.  He was on PCA morphine for pain management initially.  This was switched over to Percocet p.o. for which he  tolerated well and continued to control his pain well.  He received two days worth of Ancef 1 g IV postoperatively.  His incision was examined on postoperative day #2 and subsequently found to be clean, dry, and intact without signs or symptoms of infection.  Coumadin was started on DVT prophylaxis and dosed by pharmacy.  On postoperative day #1, he was noted to have hyponatremia.  He had some fluid restriction at  that time.  Also, a medicine consult was requested for concurrent medical management and for treatment of his hyponatremia.  Physical therapy was started.  The patient was weightbearing as tolerated with the assistance of a walker.  On postoperative day #2, the patient did develop some shortness of breath.  A chest x-ray was ordered.  It did appear that he had some degree of CHF.  He was transferred to a monitored floor for continued observation and medical management.  He was treated with Lasix for his mild CHF exacerbation and internal medicine adjusted fluids for possible SIADH. Cardiology also was consulted for concurrent cardiac management.  Cardiac enzymes were ordered.  The troponin level was slightly elevated, but his CK-MB was within normal limits.  Cardiology doubted that this was an infarct and felt that this increase in troponin was secondary to pulmonary edema.  He did have a Doppler ordered due to some calf tenderness and this showed no evidence of acute DVT.  The patient had been started on some Cipro for concern about possible infiltrate, but upon repeat chest x-ray there was no evidence of infiltrate and the Cipro was discontinued.  On September 11, 2000, the patient had stabilized, had no further shortness of breath, and was stable from an orthopedic, cardiac, and medicine standpoint.  He was ready for discharge home.  DISPOSITION:  The patient will be discharged home with [facility] for home health physical therapy and nursing.  DIET:  A cardiac, low-sodium diet.  ACTIVITY:  The patient may be weightbearing as tolerated per the total knee protocol.  [facility] physical therapy for total knee protocol.  WOUND CARE:  Once daily dressing changes.  May begin showering once daily.  FOLLOW-UP:  Pro times per protocol.  He is to follow up with Ollen Gross, M.D., the following Thursday or Friday.  He is to call [phone] for an appointment.  CONDITION ON  DISCHARGE:  Good and improved.  DISCHARGE MEDICATIONS:  1. Coumadin per pharmacy protocol.   2. Percocet 5/325 mg one to two p.o. q.4-6h. p.r.n. pain.  3. Robaxin 500 mg p.o. q.8h. p.r.n. spasm.  4. Trinsicon one p.o. t.i.d. p.c.  5. Synthroid 50 mcg p.o. q.d.  6. Allopurinol 100 mg p.o. q.d.  7. Atenolol 25 mg p.o. q.d.  8. Norvasc 5 mg p.o. q.d.  9. Zocor 20 mg p.o. q.d. 10. Celexa 20 mg one half tablet p.o. q.d. 11. Coated aspirin 325 mg p.o. q.d. 12. Hydroxyzine 25 mg q.d. 13. Topical cream p.r.n. 14. Chloridia clidinium capsules up to q.i.d. p.r.n. 15. Lasix 40 mg p.o. q.d. DD:  10/02/00 TD:  10/02/00 Job: [id number]

## 2010-07-23 NOTE — Cardiovascular Report (Signed)
[facility]  Patient:    Jon [name], Jon [name]                       MRN: [id number] Proc. Date: 08/08/00 Attending:  Thereasa Solo. Little, M.D. CC:         Cardiac Catheterization Laboratory  Antony Madura, M.D.  Ollen Gross, M.D.   Cardiac Catheterization  INDICATIONS FOR PROCEDURE:  The patient is a 77 year old male, who had bypass surgery in 1989.  He subsequently was evaluated for preoperative clearance for knee surgery.  A Persantine study showed anterior ischemia and he is brought in for elective outpatient cardiac catheterization.  He has had no anginal complaints.  PROCEDURES: 1. Left heart catheterization. 2. Selective right and left coronary arteriography. 3. Ventriculography. 4. Graft visualization including saphenous vein graft x2 and internal mammary    artery x1.  COMPLICATIONS:  None.  EQUIPMENT:  A 6 French Judkins configuration catheters.  Internal mammary artery was cannulated with right coronary catheter.  DESCRIPTION OF PROCEDURE:  The patient was prepped and draped in the usual sterile fashion exposing the right groin.  Following local anesthetic with 1% Xylocaine, the Seldinger technique was employed and a 6 [place] introducer sheath was placed in the right femoral artery.  Selective right and left coronary arteriography, graft visualization, and ventriculography in the RAO projection was performed.  The patient was premedicated with 50 mg of Benadryl and given 2 mg of IV Versed during the procedure.  RESULTS: 1. Hemodynamic monitoring:  Central aortic pressure 170/76, left    ventricular pressure 170/21 with no aortic valve gradient noted at the    time of pullback. 2. Ventriculography:  Ventriculography in the RAO projection using 25 cc of    contrast at 12 cc/sec. revealed slight dilatation of the left ventricle.    The anterior wall was mildly hypokinetic all the way to the apex.    Ejection fraction was  approximately 50%.  The end-diastolic pressure was    21.  CORONARY ARTERIOGRAPHY:  On fluoroscopy there was calcification noted in the left main, LAD and proximal circumflex. 1. Left main:  Normal. 2. LAD.  The LAD was 100% occluded very proximally to the left main. 3. Optional diagonal (intermediate):  This is 100% occluded proximally. 3. Circumflex:  The circumflex had an area of 90% proximal obstruction and    was total just distal to this. 4. Right coronary artery:  The right coronary artery was a nondominant vessel    supplying the right ventricle.  There was ostial 30-40% narrowing and there    was an area of 40% narrowing in the proximal portion of an RV branch.  A    6 French Judkins configuration catheter did dampen the ostium of the RCA,    but the vessel itself was very small in diameter at about 1.75 mm. 5. Saphenous vein graft sequentially to OM-1 and OM-2.  The graft itself was    widely patent.  There was brisk flow.  All the OMs were visualized and in    fact three OMs were seen.  There was retrograde filling from the last OM    all the way up into the native circumflex.  There was proximal narrowing    retrograde of the OM-3.  This area of about 80% obstruction is not amenable    to any type of intervention. 6. Saphenous vein graft to optional diagonal and diagonal.  This graft was  100% occluded proximally. 7. Internal mammary artery to the LAD:  The internal mammary artery itself was    widely patent.  The LAD itself was a small diameter vessel but was free of    significant narrowing.  From the OM system, there was a large left to left collateral bed supplying what appears to be the anterior and anterolateral wall to be consistent with collaterals to the diagonal, optional diagonal.  CONCLUSIONS: 1. Mild left ventricular dysfunction with 50% ejection fraction, slight    dilatation of the left ventricular cavity and mild anterior wall    hypokinesia. 2. Severe  native disease. 3. Loss of one of the bypass graft to the optional diagonal but left to left    collaterals supplied this area. 4. Mild to moderate disease in a nondominant right coronary artery.  At this point, the patient will require additional medical therapy to help increase the collateral flow, but there is nothing that would make him extremely high risk for any type of elective knee surgery, and I think that he will do well on medical therapy. DD:  08/08/00 TD:  08/08/00 Job: [id number]

## 2010-12-06 DIAGNOSIS — Z9289 Personal history of other medical treatment: Secondary | ICD-10-CM

## 2010-12-06 HISTORY — DX: Personal history of other medical treatment: Z92.89

## 2010-12-20 LAB — BASIC METABOLIC PANEL
BUN: 13
GFR calc non Af Amer: >60
Glucose, Bld: 107 — ABNORMAL HIGH
Potassium: 3.8

## 2010-12-20 LAB — CK TOTAL AND CKMB (NOT AT ARMC)
CK, MB: 2.6
Total CK: 103

## 2010-12-20 LAB — CBC
MCV: 93.1
Platelets: 199
RDW: 13.1
WBC: 6.3

## 2010-12-20 LAB — TROPONIN I: Troponin I: 0.01

## 2011-02-03 DIAGNOSIS — K648 Other hemorrhoids: Secondary | ICD-10-CM

## 2011-02-03 HISTORY — DX: Other hemorrhoids: K64.8

## 2011-02-27 ENCOUNTER — Ambulatory Visit (INDEPENDENT_AMBULATORY_CARE_PROVIDER_SITE_OTHER): Payer: Medicare Other

## 2011-02-27 DIAGNOSIS — J157 Pneumonia due to Mycoplasma pneumoniae: Secondary | ICD-10-CM

## 2011-02-27 DIAGNOSIS — R040 Epistaxis: Secondary | ICD-10-CM

## 2011-03-04 ENCOUNTER — Encounter: Payer: Self-pay | Admitting: Emergency Medicine

## 2011-03-04 ENCOUNTER — Other Ambulatory Visit: Payer: Self-pay

## 2011-03-04 ENCOUNTER — Inpatient Hospital Stay (HOSPITAL_COMMUNITY)
Admission: EM | Admit: 2011-03-04 | Discharge: 2011-03-08 | DRG: 194 | Disposition: A | Discharge status: Nursing Home (Includes ICF) | Payer: Medicare Other | Source: Ambulatory Visit | Attending: Internal Medicine | Admitting: Internal Medicine

## 2011-03-04 ENCOUNTER — Emergency Department (HOSPITAL_COMMUNITY): Payer: Medicare Other

## 2011-03-04 DIAGNOSIS — E039 Hypothyroidism, unspecified: Secondary | ICD-10-CM | POA: Diagnosis present

## 2011-03-04 DIAGNOSIS — J189 Pneumonia, unspecified organism: Secondary | ICD-10-CM | POA: Diagnosis present

## 2011-03-04 DIAGNOSIS — Z950 Presence of cardiac pacemaker: Secondary | ICD-10-CM | POA: Diagnosis not present

## 2011-03-04 DIAGNOSIS — W19XXXA Unspecified fall, initial encounter: Secondary | ICD-10-CM

## 2011-03-04 DIAGNOSIS — R059 Cough, unspecified: Secondary | ICD-10-CM

## 2011-03-04 DIAGNOSIS — Z888 Allergy status to other drugs, medicaments and biological substances status: Secondary | ICD-10-CM | POA: Diagnosis not present

## 2011-03-04 DIAGNOSIS — R531 Weakness: Secondary | ICD-10-CM

## 2011-03-04 DIAGNOSIS — I10 Essential (primary) hypertension: Secondary | ICD-10-CM | POA: Diagnosis present

## 2011-03-04 DIAGNOSIS — Z7901 Long term (current) use of anticoagulants: Secondary | ICD-10-CM

## 2011-03-04 DIAGNOSIS — Z7982 Long term (current) use of aspirin: Secondary | ICD-10-CM | POA: Diagnosis not present

## 2011-03-04 DIAGNOSIS — R000 Tachycardia, unspecified: Secondary | ICD-10-CM | POA: Diagnosis not present

## 2011-03-04 DIAGNOSIS — I4891 Unspecified atrial fibrillation: Secondary | ICD-10-CM

## 2011-03-04 DIAGNOSIS — Z79899 Other long term (current) drug therapy: Secondary | ICD-10-CM

## 2011-03-04 DIAGNOSIS — E871 Hypo-osmolality and hyponatremia: Secondary | ICD-10-CM | POA: Diagnosis present

## 2011-03-04 DIAGNOSIS — Z951 Presence of aortocoronary bypass graft: Secondary | ICD-10-CM | POA: Diagnosis not present

## 2011-03-04 DIAGNOSIS — D696 Thrombocytopenia, unspecified: Secondary | ICD-10-CM | POA: Diagnosis not present

## 2011-03-04 DIAGNOSIS — R52 Pain, unspecified: Secondary | ICD-10-CM

## 2011-03-04 DIAGNOSIS — I2510 Atherosclerotic heart disease of native coronary artery without angina pectoris: Secondary | ICD-10-CM | POA: Diagnosis present

## 2011-03-04 DIAGNOSIS — R05 Cough: Secondary | ICD-10-CM | POA: Diagnosis not present

## 2011-03-04 DIAGNOSIS — J690 Pneumonitis due to inhalation of food and vomit: Secondary | ICD-10-CM | POA: Diagnosis present

## 2011-03-04 HISTORY — DX: Hypo-osmolality and hyponatremia: E87.1

## 2011-03-04 HISTORY — DX: Unspecified atrial fibrillation: I48.91

## 2011-03-04 HISTORY — DX: Essential (primary) hypertension: I10

## 2011-03-04 HISTORY — DX: Unspecified fall, initial encounter: W19.XXXA

## 2011-03-04 HISTORY — DX: Atherosclerotic heart disease of native coronary artery without angina pectoris: I25.10

## 2011-03-04 HISTORY — DX: Hypothyroidism, unspecified: E03.9

## 2011-03-04 LAB — CBC
HCT: 40.9 % (ref 39.0–52.0)
Hemoglobin: 15.4 g/dL (ref 13.0–17.0)
MCH: 32.5 pg (ref 26.0–34.0)
MCHC: 37 g/dL — ABNORMAL HIGH (ref 30.0–36.0)
MCV: 86.3 fL (ref 78.0–100.0)
Platelets: 179 10*3/uL (ref 150–400)
RBC: 4.74 MIL/uL (ref 4.22–5.81)
RDW: 12.3 % (ref 11.5–15.5)
WBC: 11.3 10*3/uL — ABNORMAL HIGH (ref 4.0–10.5)

## 2011-03-04 LAB — SODIUM: Sodium: 112 mEq/L — CL (ref 135–145)

## 2011-03-04 LAB — BASIC METABOLIC PANEL
BUN: 18 mg/dL (ref 6–23)
CO2: 29 mEq/L (ref 19–32)
Calcium: 8.4 mg/dL (ref 8.4–10.5)
Chloride: 70 mEq/L — ABNORMAL LOW (ref 96–112)
Creatinine, Ser: 0.7 mg/dL (ref 0.50–1.35)
GFR calc Af Amer: >90 mL/min (ref >90)
GFR calc non Af Amer: 82 mL/min — ABNORMAL LOW (ref >90)
Glucose, Bld: 134 mg/dL — ABNORMAL HIGH (ref 70–99)
Potassium: 3.4 mEq/L — ABNORMAL LOW (ref 3.5–5.1)
Sodium: 109 mEq/L — CL (ref 135–145)

## 2011-03-04 LAB — URINALYSIS, ROUTINE W REFLEX MICROSCOPIC
Bilirubin Urine: NEGATIVE
Glucose, UA: NEGATIVE mg/dL
Hgb urine dipstick: NEGATIVE
Ketones, ur: NEGATIVE mg/dL
Leukocytes, UA: NEGATIVE
Nitrite: NEGATIVE
Protein, ur: 30 mg/dL — AB
Specific Gravity, Urine: 1.014 (ref 1.005–1.030)
Urobilinogen, UA: 1 mg/dL (ref 0.0–1.0)
pH: 7 (ref 5.0–8.0)

## 2011-03-04 LAB — STREP PNEUMONIAE URINARY ANTIGEN: Strep Pneumo Urinary Antigen: NEGATIVE

## 2011-03-04 LAB — URINE MICROSCOPIC-ADD ON

## 2011-03-04 LAB — RAPID HIV SCREEN (WH-MAU): Rapid HIV Screen: NONREACTIVE

## 2011-03-04 LAB — PROTIME-INR
INR: 1.78 — ABNORMAL HIGH (ref 0.00–1.49)
Prothrombin Time: 21 seconds — ABNORMAL HIGH (ref 11.6–15.2)

## 2011-03-04 LAB — POCT I-STAT TROPONIN I: Troponin i, poc: 0.01 ng/mL (ref 0.00–0.08)

## 2011-03-04 LAB — EXPECTORATED SPUTUM ASSESSMENT W GRAM STAIN, RFLX TO RESP C

## 2011-03-04 LAB — PRO B NATRIURETIC PEPTIDE: Pro B Natriuretic peptide (BNP): 4203 pg/mL — ABNORMAL HIGH (ref 0–450)

## 2011-03-04 LAB — TSH: TSH: 2.102 u[IU]/mL (ref 0.350–4.500)

## 2011-03-04 LAB — SODIUM, URINE, RANDOM: Sodium, Ur: <10 mEq/L

## 2011-03-04 MED ORDER — IPRATROPIUM BROMIDE 0.02 % IN SOLN: 0.5000 mg | Freq: Four times a day (QID) | RESPIRATORY_TRACT | Status: DC | PRN

## 2011-03-04 MED ORDER — NON FORMULARY: 20.0000 mg | Freq: Every day | Status: DC

## 2011-03-04 MED ORDER — LEVALBUTEROL HCL 0.63 MG/3ML IN NEBU
0.6300 mg | INHALATION_SOLUTION | RESPIRATORY_TRACT | Status: DC | PRN
  Filled 2011-03-04: qty 3

## 2011-03-04 MED ORDER — POLYVINYL ALCOHOL 1.4 % OP SOLN
1.0000 [drp] | OPHTHALMIC | Status: DC | PRN
  Administered 2011-03-06 – 2011-03-07 (×3): 1 [drp] via OPHTHALMIC
  Filled 2011-03-04: qty 15

## 2011-03-04 MED ORDER — AMIODARONE HCL 200 MG PO TABS
200.0000 mg | ORAL_TABLET | Freq: Every day | ORAL | Status: DC
  Administered 2011-03-04 – 2011-03-08 (×5): 200 mg via ORAL
  Filled 2011-03-04 (×5): qty 1

## 2011-03-04 MED ORDER — GUAIFENESIN ER 600 MG PO TB12
600.0000 mg | ORAL_TABLET | Freq: Two times a day (BID) | ORAL | Status: DC
  Administered 2011-03-04 – 2011-03-08 (×9): 600 mg via ORAL
  Filled 2011-03-04 (×10): qty 1

## 2011-03-04 MED ORDER — ONDANSETRON HCL 4 MG/2ML IJ SOLN
4.0000 mg | Freq: Three times a day (TID) | INTRAMUSCULAR | Status: DC | PRN
  Filled 2011-03-04: qty 2

## 2011-03-04 MED ORDER — ASPIRIN EC 81 MG PO TBEC
81.0000 mg | DELAYED_RELEASE_TABLET | Freq: Every day | ORAL | Status: DC
  Administered 2011-03-04 – 2011-03-08 (×5): 81 mg via ORAL
  Filled 2011-03-04 (×5): qty 1

## 2011-03-04 MED ORDER — DEXTROSE 5 % IV SOLN
1.0000 g | INTRAVENOUS | Status: DC
  Administered 2011-03-04 – 2011-03-05 (×2): 1 g via INTRAVENOUS
  Filled 2011-03-04 (×3): qty 10

## 2011-03-04 MED ORDER — DEXTROSE 5 % IV SOLN
500.0000 mg | Freq: Once | INTRAVENOUS | Status: DC
  Filled 2011-03-04: qty 500

## 2011-03-04 MED ORDER — ACETAMINOPHEN 325 MG PO TABS
650.0000 mg | ORAL_TABLET | Freq: Four times a day (QID) | ORAL | Status: DC | PRN
  Administered 2011-03-04: 650 mg via ORAL
  Filled 2011-03-04: qty 2

## 2011-03-04 MED ORDER — SODIUM CHLORIDE 0.9 % IV SOLN: INTRAVENOUS | Status: DC

## 2011-03-04 MED ORDER — POTASSIUM CHLORIDE CRYS ER 20 MEQ PO TBCR
40.0000 meq | EXTENDED_RELEASE_TABLET | Freq: Once | ORAL | Status: AC
  Administered 2011-03-04: 40 meq via ORAL
  Filled 2011-03-04: qty 2

## 2011-03-04 MED ORDER — TRIAMCINOLONE ACETONIDE 0.1 % EX CREA
1.0000 "application " | TOPICAL_CREAM | Freq: Every day | CUTANEOUS | Status: DC | PRN
  Filled 2011-03-04: qty 15

## 2011-03-04 MED ORDER — LORAZEPAM 0.5 MG PO TABS: 0.5000 mg | ORAL_TABLET | Freq: Every day | ORAL | Status: DC | PRN

## 2011-03-04 MED ORDER — SODIUM CHLORIDE 0.9 % IJ SOLN: 3.0000 mL | INTRAMUSCULAR | Status: DC | PRN

## 2011-03-04 MED ORDER — NITROGLYCERIN 0.4 MG SL SUBL: 0.4000 mg | SUBLINGUAL_TABLET | SUBLINGUAL | Status: DC | PRN

## 2011-03-04 MED ORDER — SODIUM CHLORIDE 0.9 % IV SOLN
250.0000 mL | INTRAVENOUS | Status: DC | PRN
  Administered 2011-03-04: 250 mL via INTRAVENOUS

## 2011-03-04 MED ORDER — DEXTROSE 5 % IV SOLN
500.0000 mg | INTRAVENOUS | Status: DC
  Administered 2011-03-04 – 2011-03-05 (×2): 500 mg via INTRAVENOUS
  Filled 2011-03-04 (×3): qty 500

## 2011-03-04 MED ORDER — TOLVAPTAN 15 MG PO TABS
15.0000 mg | ORAL_TABLET | Freq: Every day | ORAL | Status: DC
  Administered 2011-03-04: 15 mg via ORAL
  Filled 2011-03-04: qty 1

## 2011-03-04 MED ORDER — CETAPHIL MOISTURIZING EX LOTN
1.0000 "application " | TOPICAL_LOTION | Freq: Every day | CUTANEOUS | Status: DC | PRN
  Filled 2011-03-04: qty 118

## 2011-03-04 MED ORDER — WHITE PETROLATUM GEL
Status: AC
  Administered 2011-03-04: 21:00:00
  Filled 2011-03-04: qty 5

## 2011-03-04 MED ORDER — OLMESARTAN MEDOXOMIL 40 MG PO TABS
40.0000 mg | ORAL_TABLET | Freq: Every day | ORAL | Status: DC
  Administered 2011-03-04 – 2011-03-08 (×5): 40 mg via ORAL
  Filled 2011-03-04 (×5): qty 1

## 2011-03-04 MED ORDER — LEVOTHYROXINE SODIUM 75 MCG PO TABS
75.0000 ug | ORAL_TABLET | Freq: Every day | ORAL | Status: DC
  Administered 2011-03-04 – 2011-03-08 (×5): 75 ug via ORAL
  Filled 2011-03-04 (×5): qty 1

## 2011-03-04 MED ORDER — DEXTROSE 5 % IV SOLN
1.0000 g | Freq: Once | INTRAVENOUS | Status: DC
  Filled 2011-03-04: qty 10

## 2011-03-04 MED ORDER — DEXTROSE 5 % IV SOLN
1.0000 g | INTRAVENOUS | Status: DC
  Filled 2011-03-04: qty 10

## 2011-03-04 MED ORDER — ADULT MULTIVITAMIN W/MINERALS CH
1.0000 | ORAL_TABLET | Freq: Every day | ORAL | Status: DC
  Administered 2011-03-04 – 2011-03-08 (×5): 1 via ORAL
  Filled 2011-03-04 (×5): qty 1

## 2011-03-04 MED ORDER — WARFARIN SODIUM 2.5 MG PO TABS
2.5000 mg | ORAL_TABLET | Freq: Once | ORAL | Status: AC
  Administered 2011-03-04: 2.5 mg via ORAL
  Filled 2011-03-04: qty 1

## 2011-03-04 MED ORDER — METOPROLOL TARTRATE 25 MG PO TABS
75.0000 mg | ORAL_TABLET | Freq: Two times a day (BID) | ORAL | Status: DC
  Administered 2011-03-04 – 2011-03-08 (×9): 75 mg via ORAL
  Filled 2011-03-04 (×10): qty 1

## 2011-03-04 MED ORDER — PRAVASTATIN SODIUM 20 MG PO TABS
20.0000 mg | ORAL_TABLET | Freq: Every day | ORAL | Status: DC
  Administered 2011-03-04 – 2011-03-07 (×4): 20 mg via ORAL
  Filled 2011-03-04 (×5): qty 1

## 2011-03-04 MED ORDER — ONDANSETRON HCL 4 MG/2ML IJ SOLN: 4.0000 mg | Freq: Four times a day (QID) | INTRAMUSCULAR | Status: DC | PRN

## 2011-03-04 MED ORDER — ZOLPIDEM TARTRATE 5 MG PO TABS
5.0000 mg | ORAL_TABLET | Freq: Every evening | ORAL | Status: DC | PRN
  Administered 2011-03-06: 5 mg via ORAL
  Filled 2011-03-04: qty 1

## 2011-03-04 MED ORDER — SODIUM CHLORIDE 0.9 % IJ SOLN
3.0000 mL | Freq: Two times a day (BID) | INTRAMUSCULAR | Status: DC
  Administered 2011-03-04 – 2011-03-07 (×7): 3 mL via INTRAVENOUS

## 2011-03-04 MED ORDER — ALLOPURINOL 100 MG PO TABS
50.0000 mg | ORAL_TABLET | Freq: Every day | ORAL | Status: DC
  Administered 2011-03-04 – 2011-03-08 (×5): 50 mg via ORAL
  Filled 2011-03-04 (×5): qty 0.5

## 2011-03-04 MED ORDER — BROMFENAC SODIUM (ONCE-DAILY) 0.09 % OP SOLN: 1.0000 [drp] | Freq: Every day | OPHTHALMIC | Status: DC | PRN

## 2011-03-04 MED ORDER — SODIUM CHLORIDE 0.9 % IV BOLUS (SEPSIS)
500.0000 mL | Freq: Once | INTRAVENOUS | Status: AC
  Administered 2011-03-04: 500 mL via INTRAVENOUS

## 2011-03-04 NOTE — ED Notes (Signed)
Pt and family reports pt has flu like symptoms, decrease appetite and feels bloated. Pt seen at [facility]D and was told he had pneumonia. Pt reports then he was told he has bronchitis.

## 2011-03-04 NOTE — ED Notes (Signed)
Pt attempted to urinate, but unable to catch any in uninal.  Will try again later.  Urinal at bedside

## 2011-03-04 NOTE — Progress Notes (Signed)
Utilization Review Completed.Jon [name] T12/28/2012   

## 2011-03-04 NOTE — Progress Notes (Signed)
CRITICAL VALUE ALERT  Critical value received: NA 112  Date of notification: 03/04/2011  Time of notification:  2050  Critical value read back:yes  Nurse who received alert:  Timmie Foerster, RN  MD notified (1st page):  Burnadette Peter NP  Time of first page:  2050  MD notified (2nd page):  Time of second page:  Responding MD: Burnadette Peter NP  Time MD responded:  2055

## 2011-03-04 NOTE — ED Notes (Signed)
Explained to patient we need a urine sample.  Patient stated he would need a little time.  Gave Patient a urinal, call light is at bed side.

## 2011-03-04 NOTE — ED Notes (Signed)
Pt assisted in repositioning up in the bed. NAD, no verbal complaints at this time.  Pt deenies pain/discomfort.  Ice water and warm blanket given.  Family at [facility].

## 2011-03-04 NOTE — Progress Notes (Signed)
ANTICOAGULATION CONSULT NOTE - Initial Consult  Pharmacy Consult for Warfarin Indication: atrial fibrillation  Allergies  Allergen Reactions  . Caduet (Amlodipine-Atorvastatin)     Weakness   . Lipitor (Atorvastatin Calcium)     Muscle pain  . Simvastatin     Muscle pain    Patient Measurements: Height: 5\' 8"  (172.7 cm) Weight: 169 lb 1.5 oz (76.7 kg) IBW/kg (Calculated) : 68.4    Vital Signs: Temp: 98.1 F (36.7 C) (12/28 1025) Temp src: Oral (12/28 1310) BP: 151/63 mmHg (12/28 1310) Pulse Rate: 64  (12/28 1310)  Labs:  [facility] 03/04/11 0959  HGB 15.4  HCT 40.9  PLT 179  APTT --  LABPROT 21.0*  INR 1.78*  HEPARINUNFRC --  CREATININE 0.70  CKTOTAL --  CKMB --  TROPONINI --   Estimated Creatinine Clearance: 62.9 ml/min (by C-G formula based on Cr of 0.7).  Medical History: Past Medical History  Diagnosis Date  . Hypertension   . Coronary artery disease     Medications:  Prescriptions prior to admission  Medication Sig Dispense Refill  . allopurinol (ZYLOPRIM) 100 MG tablet Take 50 mg by mouth daily.        Marland Kitchen amiodarone (PACERONE) 200 MG tablet Take 200 mg by mouth daily.        Marland Kitchen aspirin EC 81 MG tablet Take 81 mg by mouth daily.        . Bromfenac Sodium (BROMDAY) 0.09 % SOLN Apply 1 drop to eye daily as needed. In left eye.  For dry eyes       . cetaphil (CETAPHIL) lotion Apply 1 application topically daily as needed. Compounded with triamcinolone cream 1% (1:3 ratio).  For rash       . doxycycline (VIBRA-TABS) 100 MG tablet Take 100 mg by mouth 2 (two) times daily.        . hydrochlorothiazide (HYDRODIURIL) 25 MG tablet Take 25 mg by mouth every morning.        Marland Kitchen levothyroxine (SYNTHROID, LEVOTHROID) 75 MCG tablet Take 75 mcg by mouth daily.        Marland Kitchen LORazepam (ATIVAN) 0.5 MG tablet Take 0.5 mg by mouth daily as needed. For nerves       . metoprolol (LOPRESSOR) 50 MG tablet Take 75 mg by mouth 2 (two) times daily.        . Multiple Vitamin  (MULITIVITAMIN WITH MINERALS) TABS Take 1 tablet by mouth daily.        Marland Kitchen OVER THE COUNTER MEDICATION Take 1 tablet by mouth daily as needed. qc allergy relief multi symptom.  For allergies       . pravastatin (PRAVACHOL) 20 MG tablet Take 10 mg by mouth daily.        Marland Kitchen triamcinolone cream (KENALOG) 0.1 % Apply 1 application topically daily as needed. Compounded with cetaphil lotion (3:1 ratio).  For rash       . valsartan (DIOVAN) 320 MG tablet Take 320 mg by mouth at bedtime.        Marland Kitchen warfarin (COUMADIN) 2.5 MG tablet Take 1.25-2.5 mg by mouth daily. Take 1 tablet daily except take 1/2 tablet on Monday and friday       . zolpidem (AMBIEN) 10 MG tablet Take 5-10 mg by mouth at bedtime as needed. For sleep       . nitroGLYCERIN (NITROSTAT) 0.4 MG SL tablet Place 0.4 mg under the tongue every 5 (five) minutes as needed. For chest pain  Assessment: 77 year old man admitted with shortness of breath to continue on chronic warfarin for AFib.  INR today 1.78 which is below goal. Goal of Therapy:  INR 2-3   Plan:  Warfarin 2.5mg  x 1 dose today. Daily Protimes.  Mickeal Skinner 03/04/2011,1:57 PM

## 2011-03-04 NOTE — ED Notes (Signed)
Family at bedside. Daughter reports, that she noticed weakness last night and change in speech. Daughter reported same information to the ER doctor as well.

## 2011-03-04 NOTE — ED Provider Notes (Addendum)
History    89yM with cough and sob. Onset about 5d ago. Progressively worsening. Nonproductive. No fever or chills. No CP or back pain. Seen as outpt and diagnosed with pneumonia. Took 1d of augmentin but switched to doxycycline because of GI upset. Has taken one dose doxy. No n/v/d. Generally weak and feels off balance. Had fall a few days ago when lost balance. Fell onto L side. Doesn't think hit head. Denies acute HA, neck or back pain. Per daughter pt has not been self. A week ago golfed, drove car and did own shopping. Per daughter seems like has L facial droop and off balance when walks. On coumadin for hx of afib. Denies recent med changes.  CSN: [id number]  Arrival date & time 03/04/11  0908   First MD Initiated Contact with Patient 03/04/11 [phone]      Chief Complaint  Patient presents with  . Cough    (Consider location/radiation/quality/duration/timing/severity/associated sxs/prior treatment) HPI  Past Medical History  Diagnosis Date  . Hypertension     Past Surgical History  Procedure Date  . Coronary artery bypass graft   . Pacemaker insertion     History reviewed. No pertinent family history.  History  Substance Use Topics  . Smoking status: Never Smoker   . Smokeless tobacco: Not on file  . Alcohol Use: No      Review of Systems   Review of symptoms negative unless otherwise noted in HPI.   Allergies  Caduet; Lipitor; and Simvastatin  Home Medications   Current Outpatient Rx  Name Route Sig Dispense Refill  . ALLOPURINOL 100 MG PO TABS Oral Take 50 mg by mouth daily.      . AMIODARONE HCL 200 MG PO TABS Oral Take 200 mg by mouth daily.      . ASPIRIN EC 81 MG PO TBEC Oral Take 81 mg by mouth daily.      Marland Kitchen BROMFENAC SODIUM (ONCE-DAILY) 0.09 % OP SOLN Ophthalmic Apply 1 drop to eye daily as needed. In left eye.  For dry eyes     . CETAPHIL MOISTURIZING EX LOTN Topical Apply 1 application topically daily as needed. Compounded with triamcinolone cream  1% (1:3 ratio).  For rash     . DOXYCYCLINE HYCLATE 100 MG PO TABS Oral Take 100 mg by mouth 2 (two) times daily.      Marland Kitchen HYDROCHLOROTHIAZIDE 25 MG PO TABS Oral Take 25 mg by mouth every morning.      Marland Kitchen LEVOTHYROXINE SODIUM 75 MCG PO TABS Oral Take 75 mcg by mouth daily.      Marland Kitchen LORAZEPAM 0.5 MG PO TABS Oral Take 0.5 mg by mouth daily as needed. For nerves     . METOPROLOL TARTRATE 50 MG PO TABS Oral Take 75 mg by mouth 2 (two) times daily.      . ADULT MULTIVITAMIN W/MINERALS CH Oral Take 1 tablet by mouth daily.      Marland Kitchen OVER THE COUNTER MEDICATION Oral Take 1 tablet by mouth daily as needed. qc allergy relief multi symptom.  For allergies     . PRAVASTATIN SODIUM 20 MG PO TABS Oral Take 10 mg by mouth daily.      . TRIAMCINOLONE ACETONIDE 0.1 % EX CREA Topical Apply 1 application topically daily as needed. Compounded with cetaphil lotion (3:1 ratio).  For rash     . VALSARTAN 320 MG PO TABS Oral Take 320 mg by mouth at bedtime.      . WARFARIN SODIUM 2.5  MG PO TABS Oral Take 1.25-2.5 mg by mouth daily. Take 1 tablet daily except take 1/2 tablet on Monday and friday     . ZOLPIDEM TARTRATE 10 MG PO TABS Oral Take 5-10 mg by mouth at bedtime as needed. For sleep     . NITROGLYCERIN 0.4 MG SL SUBL Sublingual Place 0.4 mg under the tongue every 5 (five) minutes as needed. For chest pain       BP 121/68  Pulse 59  Temp(Src) 98.1 F (36.7 C) (Oral)  Resp 59  SpO2 95%  Physical Exam  Nursing note and vitals reviewed. Constitutional: He appears well-developed. No distress.       Sitting up in bed in NAD.  HENT:  Head: Normocephalic and atraumatic.  Right Ear: External ear normal.  Left Ear: External ear normal.  Mouth/Throat: No oropharyngeal exudate.  Eyes: Conjunctivae are normal. Pupils are equal, round, and reactive to light. Right eye exhibits no discharge. Left eye exhibits no discharge.  Neck: Neck supple.  Cardiovascular: Normal rate.  Exam reveals no gallop and no friction rub.     Murmur heard.      irregular  Pulmonary/Chest: Effort normal. No respiratory distress.       Diffuse faint end expiratory wheezing. Faint L sided rhonchi.  Abdominal: Soft. He exhibits no distension. There is no tenderness.  Musculoskeletal: He exhibits no tenderness.       No midline spinal tenderness. Mild edema RLE as compared to L. Pt reports   Lymphadenopathy:    He has no cervical adenopathy.  Neurological: He is alert. No cranial nerve deficit. Coordination normal.       LLE strength 4/5. Motor exam otherwise nonfocal. Questionable loss of nasolabial fold L side.  Skin: Skin is warm and dry. He is not diaphoretic.  Psychiatric: He has a normal mood and affect. His behavior is normal. Thought content normal.    ED Course  Procedures (including critical care time)  Labs Reviewed  PROTIME-INR - Abnormal; Notable for the following:    Prothrombin Time 21.0 (*)    INR 1.78 (*)    All other components within normal limits  CBC - Abnormal; Notable for the following:    WBC 11.3 (*)    MCHC 37.0 (*)    All other components within normal limits  BASIC METABOLIC PANEL - Abnormal; Notable for the following:    Sodium 109 (*)    Potassium 3.4 (*)    Chloride 70 (*)    Glucose, Bld 134 (*)    GFR calc non Af Amer 82 (*)    All other components within normal limits  POCT I-STAT TROPONIN I  URINALYSIS, ROUTINE W REFLEX MICROSCOPIC  I-STAT TROPONIN I  LEGIONELLA ANTIGEN, URINE   Dg Chest 2 View  03/04/2011  *RADIOLOGY REPORT*  Clinical Data: Cough and wheezing.  CHEST - 2 VIEW  Comparison: Two-view chest 12/26/2009.  Findings: The patient is status post mediasternotomy for CABG. Bilateral perihilar airspace disease is evident within the left upper lobe and right lower lobe.  The lung volumes are low.  Small bilateral pleural effusions are present.  IMPRESSION:  1.  Bilateral perihilar airspace disease involving the left upper lobe right lower lobe is concerning for infection. 2.   Small bilateral pleural effusions.  Original Report Authenticated By: Jamesetta Orleans. MATTERN, M.D.   Ct Head Wo Contrast  03/04/2011  *RADIOLOGY REPORT*  Clinical Data: Cough.  Lethargy.  Loss of balance.  CT HEAD WITHOUT CONTRAST  Technique:  Contiguous axial images were obtained from the base of the skull through the vertex without contrast.  Comparison: CT head without contrast 01/26/2009.  Findings: A lacunar infarct in the right thalamus is likely remote although less well seen on the prior study.  Extensive periventricular and subcortical white matter hypoattenuation is evident bilaterally.  The ventricles are normal size.  Mild generalized atrophy is likely within normal limits for age.  No significant extra-axial fluid collection is present.  A tiny fluid level is present in the right maxillary sinus.  The paranasal sinuses and mastoid air cells are otherwise clear.  The atherosclerotic calcifications are present within the cavernous carotid arteries.  The osseous skull is intact.  IMPRESSION:  1.  Stable atrophy and moderate white matter disease.  This likely reflects the sequelae of chronic microvascular ischemia. 2.  Lacunar infarct of the right thalamus is likely remote. 3.  Minimal fluid in the right maxillary sinus. 4.  Atherosclerosis.  Original Report Authenticated By: Jamesetta Orleans. MATTERN, M.D.    EKG:  Rhythm: afib Rate: 61 Axis: Left Intervals: rbbb ST segments: NS ST changes   1. Pneumonia   2. Cough   3. Generalized weakness   4. Hyponatremia       MDM  89yM with cough, dyspnea and generalized weakness. Consider CVA, infectious, lytes, deconditioning, anemia. CXR with infiltrates. Clinically pneumonia with coughing and wheezing/rhonchi. Marked hyponatremia which suspect reason for mild confusion. No indication for hypertonic saline. 500 NS bolus given prior to labs results. Gentle correction. Will defer further management to admitting physicians. Etiology unclear at  this time, consider legionella with pulmonary findings. Urine antigen sent. Covered for CAP and atypicals.  CT negative for acute pathology. Will need admission for further tx and evaluation.        Raeford Razor, MD 03/04/11 1150  Raeford Razor, MD 03/04/11 [phone]

## 2011-03-04 NOTE — H&P (Addendum)
PCP:  No primary provider on file.   DOA:  03/04/2011  9:10 AM  Chief Complaint:  Cough and shortness of breath  HPI: 77 years old Caucasian pleasant man with multiple comorbidities was brought into the ER today chief complaint cough cough and shortness of breath for about 5 days. He described his cough as dry, associated with shortness of breath however denies any fever or chills. He was seen at urgent care facility and was prescribed amoxicillin, however he was unable to tolerate it because of nausea and his antibiotic was switched to Doxycycline  and by his BCP Dr. Chilton Si. As per family member he was also noted to be off balance and fell on his knees couple of days ago. His speech is noted to be slow and family had some concern about possible left facial droop. In the ED patient had chest x-ray done that showed finding concerning for pneumonia, CT head showed no acute findings and labs showed severe hyponatremia with sodium level of 109. He was given Zithromax and Rocephin and 500 mL of normal saline and we were asked to admit for further management.  Allergies: Allergies  Allergen Reactions  . Caduet (Amlodipine-Atorvastatin)     Weakness   . Lipitor (Atorvastatin Calcium)     Muscle pain  . Simvastatin     Muscle pain    Prior to Admission medications   Medication Sig Start Date End Date Taking? Authorizing Provider  allopurinol (ZYLOPRIM) 100 MG tablet Take 50 mg by mouth daily.     Yes Historical Provider, MD  amiodarone (PACERONE) 200 MG tablet Take 200 mg by mouth daily.     Yes Historical Provider, MD  aspirin EC 81 MG tablet Take 81 mg by mouth daily.     Yes Historical Provider, MD  Bromfenac Sodium (BROMDAY) 0.09 % SOLN Apply 1 drop to eye daily as needed. In left eye.  For dry eyes    Yes Historical Provider, MD  cetaphil (CETAPHIL) lotion Apply 1 application topically daily as needed. Compounded with triamcinolone cream 1% (1:3 ratio).  For rash    Yes Historical Provider,  MD  doxycycline (VIBRA-TABS) 100 MG tablet Take 100 mg by mouth 2 (two) times daily.   03/03/11 03/10/11 Yes Historical Provider, MD  hydrochlorothiazide (HYDRODIURIL) 25 MG tablet Take 25 mg by mouth every morning.     Yes Historical Provider, MD  levothyroxine (SYNTHROID, LEVOTHROID) 75 MCG tablet Take 75 mcg by mouth daily.     Yes Historical Provider, MD  LORazepam (ATIVAN) 0.5 MG tablet Take 0.5 mg by mouth daily as needed. For nerves    Yes Historical Provider, MD  metoprolol (LOPRESSOR) 50 MG tablet Take 75 mg by mouth 2 (two) times daily.     Yes Historical Provider, MD  Multiple Vitamin (MULITIVITAMIN WITH MINERALS) TABS Take 1 tablet by mouth daily.     Yes Historical Provider, MD  OVER THE COUNTER MEDICATION Take 1 tablet by mouth daily as needed. qc allergy relief multi symptom.  For allergies    Yes Historical Provider, MD  pravastatin (PRAVACHOL) 20 MG tablet Take 10 mg by mouth daily.     Yes Historical Provider, MD  triamcinolone cream (KENALOG) 0.1 % Apply 1 application topically daily as needed. Compounded with cetaphil lotion (3:1 ratio).  For rash    Yes Historical Provider, MD  valsartan (DIOVAN) 320 MG tablet Take 320 mg by mouth at bedtime.     Yes Historical Provider, MD  warfarin (COUMADIN) 2.5  MG tablet Take 1.25-2.5 mg by mouth daily. Take 1 tablet daily except take 1/2 tablet on Monday and friday    Yes Historical Provider, MD  zolpidem (AMBIEN) 10 MG tablet Take 5-10 mg by mouth at bedtime as needed. For sleep    Yes Historical Provider, MD  nitroGLYCERIN (NITROSTAT) 0.4 MG SL tablet Place 0.4 mg under the tongue every 5 (five) minutes as needed. For chest pain     Historical Provider, MD    Past Medical History  Diagnosis Date  . Hypertension     Past Surgical History  Procedure Date  . Coronary artery bypass graft   . Pacemaker insertion     Social History:  reports that he has never smoked. He does not have any smokeless tobacco history on file. He reports  that he does not drink alcohol or use illicit drugs.  History reviewed. No pertinent family history.  Review of Systems:  Constitutional: Denies fever, chills, diaphoresis, appetite change and fatigue.  Respiratory:C/O SOB, DOE, cough,   and wheezing.   Cardiovascular: Denies chest pain, palpitations Gastrointestinal: Denies nausea, vomiting, abdominal pain, diarrhea, constipation, blood in stool and abdominal distention.  Genitourinary: Denies dysuria, urgency, frequency, hematuria, flank pain and difficulty urinating.  Musculoskeletal: Denies myalgias, back pain, joint swelling, arthralgias C/Ogait problem.  Neurological: Denies dizziness, seizures, syncope, weakness, light-headedness, numbness and headaches.      Physical Exam:  Filed Vitals:   03/04/11 1025 03/04/11 1142 03/04/11 1145 03/04/11 1215  BP: 121/68 138/77 132/72 148/75  Pulse: 59 64 62 58  Temp: 98.1 F (36.7 C)     TempSrc: Oral     Resp: 59 14    SpO2: 95% 96% 96% 97%    Constitutional: Vital signs reviewed.  Patient is a in no acute distress and cooperative with exam. Alert and oriented x3.  Head: Normocephalic and atraumatic Neck: Supple, Trachea midline normal ROM Cardiovascular: Irregular irregular, S1 normal, S2   Pulmonary/Chest: Diffuse expiratory wheezes, and left base rales. Abdominal: Soft. Non-tender, non-distended, bowel sounds are normal, no masses, organomegaly, or guarding present.   CVA tendernessMusculoskeletal: No joint deformities, erythema, or stiffness, ROM full and no nontender Ext: Trace pedal edema and no cyanosis, pulses palpable bilaterally  Neurological: A&O x3, speech slow but not slurred .no dysarthria or dysphagia noted. Strenght is normal and symmetric , no focal motor deficit, sensory intact to light touch bilaterally.  questionable loss of left  nasolabial fold however was not significant to my assessment.    Labs on Admission:  Results for orders placed during the hospital  encounter of 03/04/11 (from the past 48 hour(s))  PROTIME-INR     Status: Abnormal   Collection Time   03/04/11  9:59 AM      Component Value Range Comment   Prothrombin Time 21.0 (*) 11.6 - 15.2 (seconds)    INR 1.78 (*) 0.00 - 1.49    CBC     Status: Abnormal   Collection Time   03/04/11  9:59 AM      Component Value Range Comment   WBC 11.3 (*) 4.0 - 10.5 (K/uL)    RBC 4.74  4.22 - 5.81 (MIL/uL)    Hemoglobin 15.4  13.0 - 17.0 (g/dL)    HCT 62.1  30.8 - 65.7 (%)    MCV 86.3  78.0 - 100.0 (fL)    MCH 32.5  26.0 - 34.0 (pg)    MCHC 37.0 (*) 30.0 - 36.0 (g/dL)    RDW 12.3  11.5 - 15.5 (%)    Platelets 179  150 - 400 (K/uL)   BASIC METABOLIC PANEL     Status: Abnormal   Collection Time   03/04/11  9:59 AM      Component Value Range Comment   Sodium 109 (*) 135 - 145 (mEq/L)    Potassium 3.4 (*) 3.5 - 5.1 (mEq/L)    Chloride 70 (*) 96 - 112 (mEq/L)    CO2 29  19 - 32 (mEq/L)    Glucose, Bld 134 (*) 70 - 99 (mg/dL)    BUN 18  6 - 23 (mg/dL)    Creatinine, Ser 1.61  0.50 - 1.35 (mg/dL)    Calcium 8.4  8.4 - 10.5 (mg/dL)    GFR calc non Af Amer 82 (*) >90 (mL/min)    GFR calc Af Amer >90  >90 (mL/min)   POCT I-STAT TROPONIN I     Status: Normal   Collection Time   03/04/11 10:22 AM      Component Value Range Comment   Troponin i, poc 0.01  0.00 - 0.08 (ng/mL)    Comment 3              Radiological Exams on Admission: Dg Chest 2 View  03/04/2011  *RADIOLOGY REPORT*  Clinical Data: Cough and wheezing.  CHEST - 2 VIEW  Comparison: Two-view chest 12/26/2009.  Findings: The patient is status post mediasternotomy for CABG. Bilateral perihilar airspace disease is evident within the left upper lobe and right lower lobe.  The lung volumes are low.  Small bilateral pleural effusions are present.  IMPRESSION:  1.  Bilateral perihilar airspace disease involving the left upper lobe right lower lobe is concerning for infection. 2.  Small bilateral pleural effusions.  Original Report  Authenticated By: Jamesetta Orleans. MATTERN, M.D.   Ct Head Wo Contrast  03/04/2011  *RADIOLOGY REPORT*  Clinical Data: Cough.  Lethargy.  Loss of balance.  CT HEAD WITHOUT CONTRAST  Technique:  Contiguous axial images were obtained from the base of the skull through the vertex without contrast.  Comparison: CT head without contrast 01/26/2009.  Findings: A lacunar infarct in the right thalamus is likely remote although less well seen on the prior study.  Extensive periventricular and subcortical white matter hypoattenuation is evident bilaterally.  The ventricles are normal size.  Mild generalized atrophy is likely within normal limits for age.  No significant extra-axial fluid collection is present.  A tiny fluid level is present in the right maxillary sinus.  The paranasal sinuses and mastoid air cells are otherwise clear.  The atherosclerotic calcifications are present within the cavernous carotid arteries.  The osseous skull is intact.  IMPRESSION:  1.  Stable atrophy and moderate white matter disease.  This likely reflects the sequelae of chronic microvascular ischemia. 2.  Lacunar infarct of the right thalamus is likely remote. 3.  Minimal fluid in the right maxillary sinus. 4.  Atherosclerosis.  Original Report Authenticated By: Jamesetta Orleans. MATTERN, M.D.  Assessment/Plan Principal Problem:  *Pneumonia Active Problems:  Hyponatremia  Atrial fibrillation  Fall  HTN (hypertension)  Hypothyroidism  Plan 1/community-acquired pneumonia Admit patient to step down unit for close monitoring We'll continue with Rocephin and Zithromax. Followup Legionella and streptococcal antigens. Continue nebulizer treatment and mucolytics. 2/ severe hyponatremia etiology is probably multifactorial. Patient was on hydrochlorothiazide ,as per family was not eating and drinking well. He could also has SIADH secondary to pneumonia, he also has history of hypothyroidism.He is obviously symptomatic manifested  by gait  imbalance and falls he also have slow speech and generalized weakness which I think is all related to his profound hyponatremia. As per family patient was unable to tolerate hypertonic saline in the past and developed pulmonary edema. Will send urine sodium, urine osmolality, serum osmolality, TSH and random cortisol level. And is hypervolemic manifested by lower extremity edema, increasing abdominal girth as per patient and family, shortness of breath and bilateral pleural effusion on chest x-ray. I will start him on Tolvaptan 15 mg by mouth daily, will insert Foley catheter and place on strict I&O . Check sodium level every 4 hours, goal of corrections not to exceed 10 meg  of sodium rise in 24 hours 3/Atrial fibrillation Continue metoprolol ,amiodarone, and Coumadin (will ask pharmacy to dose) 4/hypothyroidism Continue Synthroid, check TSH 5/Code status  Patient is full code.Patient and family informed with the above plan and in agreement. Time Spent on Admission: 50 minutes  Levada Bowersox 03/04/2011, 12:54 PM

## 2011-03-04 NOTE — ED Notes (Signed)
Called floor to give report and spoke with Bonita Quin, Charity fundraiser. VSS.  Pt prepared for transport.  Pt is A/O x3.

## 2011-03-04 NOTE — Progress Notes (Signed)
Pt transferred from ED via stretcher without problems.  Dr. Cleotis Lema notified of Serum osmo and NA results at 1830hrs.  Also notified pt flushed.  Orders to be entered by Dr. Cleotis Lema.

## 2011-03-04 NOTE — ED Notes (Signed)
[id number] ready bed- msg w/Johnetta NS/for Tia

## 2011-03-04 NOTE — ED Notes (Signed)
Family at bedside. 

## 2011-03-05 LAB — CBC
Hemoglobin: 15.1 g/dL (ref 13.0–17.0)
MCH: 32.2 pg (ref 26.0–34.0)
MCV: 86.6 fL (ref 78.0–100.0)
RBC: 4.69 MIL/uL (ref 4.22–5.81)

## 2011-03-05 LAB — SODIUM
Sodium: 116 mEq/L — CL (ref 135–145)
Sodium: 121 mEq/L — ABNORMAL LOW (ref 135–145)
Sodium: 120 mEq/L — ABNORMAL LOW (ref 135–145)
Sodium: 122 mEq/L — ABNORMAL LOW (ref 135–145)
Sodium: 123 mEq/L — ABNORMAL LOW (ref 135–145)

## 2011-03-05 LAB — BASIC METABOLIC PANEL
CO2: 26 mEq/L (ref 19–32)
Calcium: 8.3 mg/dL — ABNORMAL LOW (ref 8.4–10.5)
Creatinine, Ser: 0.74 mg/dL (ref 0.50–1.35)
Glucose, Bld: 119 mg/dL — ABNORMAL HIGH (ref 70–99)

## 2011-03-05 MED ORDER — DEXTROSE 5 % IV SOLN
INTRAVENOUS | Status: DC
  Administered 2011-03-05: 06:00:00 via INTRAVENOUS

## 2011-03-05 MED ORDER — WARFARIN SODIUM 2.5 MG PO TABS
2.5000 mg | ORAL_TABLET | Freq: Once | ORAL | Status: AC
  Administered 2011-03-05: 2.5 mg via ORAL
  Filled 2011-03-05: qty 1

## 2011-03-05 MED ORDER — MORPHINE SULFATE 2 MG/ML IJ SOLN
2.0000 mg | INTRAMUSCULAR | Status: DC | PRN
  Administered 2011-03-05: 2 mg via INTRAVENOUS
  Filled 2011-03-05: qty 1

## 2011-03-05 NOTE — Progress Notes (Signed)
CRITICAL VALUE ALERT  Critical value received:  Na- 118  Date of notification:  03/05/2011  Time of notification:  0500  Critical value read back:yes  Nurse who received alert:  Timmie Foerster, RN  MD notified (1st page):  Burnadette Peter, NP  Time of first page:  0509  MD notified (2nd page):  Time of second page:  Responding MD:  Burnadette Peter, NP  Time MD responded:  [phone]

## 2011-03-05 NOTE — Progress Notes (Signed)
CRITICAL VALUE ALERT  Critical value received:  NA 116  Date of notification: 03/05/2011  Time of notification:  0135  Critical value read back:yes  Nurse who received alert:  Timmie Foerster, RN  MD notified (1st page):  Burnadette Peter, NP  Time of first page:  0137  MD notified (2nd page):  Time of second page:  Responding MD:  Burnadette Peter, NP  Time MD responded:  [phone]

## 2011-03-05 NOTE — Progress Notes (Signed)
Subjective:  Patient seen and examined, feeling better today denies any shortness of breath or cough. He was complaining of left lower ribs last night.  Objective: Vital signs in last 24 hours: Temp:  [97.1 F (36.2 C)-98.7 F (37.1 C)] 97.1 F (36.2 C) (12/29 1230) Pulse Rate:  [58-74] 60  (12/29 1230) Resp:  [13-21] 18  (12/29 1230) BP: (81-160)/(47-114) 116/82 mmHg (12/29 1230) SpO2:  [94 %-97 %] 95 % (12/29 1230) Weight:  [75.7 kg (166 lb 14.2 oz)] 166 lb 14.2 oz (75.7 kg) (12/29 0047) Weight change:  Last BM Date: 03/03/11  Intake/Output from previous day: 12/28 0701 - 12/29 0700 In: 948 [P.O.:480; I.V.:218; IV Piggyback:250] Out: 3500 [Urine:3500] Total I/O In: -  Out: 820 [Urine:820]   Physical Exam: Alert and oriented  x3 Neck: Supple, Trachea midline normal ROM  Cardiovascular: Irregular irregular, S1 normal, S2  Pulmonary/Chest: Scattered rhonchi, left lower ribs tenderness on palpation. Abdominal: Soft. Non-tender, non-distended, bowel sounds are normal, no masses, organomegaly, or guarding present.  Ext: Trace pedal edema and no cyanosis, pulses palpable bilaterally  Neurological: A&O x3,no dysarthria or dysphagia noted. Strenght is normal and symmetric , no focal motor deficit, sensory intact to light touch bilaterally.     Lab Results: Results for orders placed during the hospital encounter of 03/04/11 (from the past 24 hour(s))  URINALYSIS, ROUTINE W REFLEX MICROSCOPIC     Status: Abnormal   Collection Time   03/04/11  2:17 PM      Component Value Range   Color, Urine YELLOW  YELLOW    APPearance CLEAR  CLEAR    Specific Gravity, Urine 1.014  1.005 - 1.030    pH 7.0  5.0 - 8.0    Glucose, UA NEGATIVE  NEGATIVE (mg/dL)   Hgb urine dipstick NEGATIVE  NEGATIVE    Bilirubin Urine NEGATIVE  NEGATIVE    Ketones, ur NEGATIVE  NEGATIVE (mg/dL)   Protein, ur 30 (*) NEGATIVE (mg/dL)   Urobilinogen, UA 1.0  0.0 - 1.0 (mg/dL)   Nitrite NEGATIVE  NEGATIVE    Leukocytes, UA NEGATIVE  NEGATIVE   LEGIONELLA ANTIGEN, URINE     Status: Normal   Collection Time   03/04/11  2:17 PM      Component Value Range   Specimen Description URINE, CLEAN CATCH     Special Requests NONE     Legionella Antigen, Urine Negative for Legionella pneumophilia serogroup 1     Report Status 03/05/2011 FINAL    STREP PNEUMONIAE URINARY ANTIGEN     Status: Normal   Collection Time   03/04/11  2:17 PM      Component Value Range   Strep Pneumo Urinary Antigen NEGATIVE  NEGATIVE   SODIUM, URINE, RANDOM     Status: Normal   Collection Time   03/04/11  2:17 PM      Component Value Range   Sodium, Ur <10    URINE MICROSCOPIC-ADD ON     Status: Normal   Collection Time   03/04/11  2:17 PM      Component Value Range   Squamous Epithelial / LPF RARE  RARE    WBC, UA 0-2  <3 (WBC/hpf)   RBC / HPF 0-2  <3 (RBC/hpf)   Bacteria, UA RARE  RARE   RAPID HIV SCREEN ([facility])     Status: Normal   Collection Time   03/04/11  3:07 PM      Component Value Range   SUDS Rapid HIV Screen NON REACTIVE  NON REACTIVE   SODIUM     Status: Abnormal   Collection Time   03/04/11  3:07 PM      Component Value Range   Sodium 113 (*) 135 - 145 (mEq/L)  OSMOLALITY     Status: Abnormal   Collection Time   03/04/11  3:07 PM      Component Value Range   Osmolality 233 (*) 275 - 300 (mOsm/kg)  TSH     Status: Normal   Collection Time   03/04/11  3:07 PM      Component Value Range   TSH 2.102  0.350 - 4.500 (uIU/mL)  SODIUM     Status: Abnormal   Collection Time   03/04/11  3:31 PM      Component Value Range   Sodium 112 (*) 135 - 145 (mEq/L)  CULTURE, SPUTUM-ASSESSMENT     Status: Normal   Collection Time   03/04/11  6:44 PM      Component Value Range   Specimen Description SPUTUM     Special Requests NONE     Sputum evaluation       Value: THIS SPECIMEN IS ACCEPTABLE. RESPIRATORY CULTURE REPORT TO FOLLOW.   Report Status 03/04/2011 FINAL    CULTURE, RESPIRATORY     Status:  Normal (Preliminary result)   Collection Time   03/04/11  6:44 PM      Component Value Range   Specimen Description SPUTUM     Special Requests NONE     Gram Stain       Value: RARE WBC PRESENT, PREDOMINANTLY PMN     NO SQUAMOUS EPITHELIAL CELLS SEEN     NO ORGANISMS SEEN   Culture PENDING     Report Status PENDING    SODIUM     Status: Abnormal   Collection Time   03/04/11  7:31 PM      Component Value Range   Sodium 112 (*) 135 - 145 (mEq/L)  PRO B NATRIURETIC PEPTIDE     Status: Abnormal   Collection Time   03/04/11  7:31 PM      Component Value Range   Pro B Natriuretic peptide (BNP) 4203.0 (*) 0 - 450 (pg/mL)  SODIUM     Status: Abnormal   Collection Time   03/04/11 11:29 PM      Component Value Range   Sodium 116 (*) 135 - 145 (mEq/L)  CBC     Status: Abnormal   Collection Time   03/05/11  3:45 AM      Component Value Range   WBC 9.5  4.0 - 10.5 (K/uL)   RBC 4.69  4.22 - 5.81 (MIL/uL)   Hemoglobin 15.1  13.0 - 17.0 (g/dL)   HCT 91.4  78.2 - 95.6 (%)   MCV 86.6  78.0 - 100.0 (fL)   MCH 32.2  26.0 - 34.0 (pg)   MCHC 37.2 (*) 30.0 - 36.0 (g/dL)   RDW 21.3  08.6 - 57.8 (%)   Platelets 181  150 - 400 (K/uL)  BASIC METABOLIC PANEL     Status: Abnormal   Collection Time   03/05/11  3:45 AM      Component Value Range   Sodium 118 (*) 135 - 145 (mEq/L)   Potassium 3.8  3.5 - 5.1 (mEq/L)   Chloride 83 (*) 96 - 112 (mEq/L)   CO2 26  19 - 32 (mEq/L)   Glucose, Bld 119 (*) 70 - 99 (mg/dL)   BUN 15  6 -  23 (mg/dL)   Creatinine, Ser 8.29  0.50 - 1.35 (mg/dL)   Calcium 8.3 (*) 8.4 - 10.5 (mg/dL)   GFR calc non Af Amer 81 (*) >90 (mL/min)   GFR calc Af Amer >90  >90 (mL/min)  MAGNESIUM     Status: Normal   Collection Time   03/05/11  3:45 AM      Component Value Range   Magnesium 2.2  1.5 - 2.5 (mg/dL)  PROTIME-INR     Status: Abnormal   Collection Time   03/05/11  3:45 AM      Component Value Range   Prothrombin Time 21.2 (*) 11.6 - 15.2 (seconds)   INR 1.80 (*)  0.00 - 1.49   CORTISOL     Status: Normal   Collection Time   03/05/11  3:46 AM      Component Value Range   Cortisol, Plasma 21.7    PRO B NATRIURETIC PEPTIDE     Status: Abnormal   Collection Time   03/05/11  3:47 AM      Component Value Range   Pro B Natriuretic peptide (BNP) 4086.0 (*) 0 - 450 (pg/mL)  SODIUM     Status: Abnormal   Collection Time   03/05/11  8:47 AM      Component Value Range   Sodium 121 (*) 135 - 145 (mEq/L)  SODIUM     Status: Abnormal   Collection Time   03/05/11 11:40 AM      Component Value Range   Sodium 120 (*) 135 - 145 (mEq/L)    Studies/Results: Dg Chest 2 View  03/04/2011  *RADIOLOGY REPORT*  Clinical Data: Cough and wheezing.  CHEST - 2 VIEW  Comparison: Two-view chest 12/26/2009.  Findings: The patient is status post mediasternotomy for t.  IMPRESSION:  1.  Bilateral perihilar airspace disease involving the left upper lobe right lower lobe is concerning for infection. 2.  Small bilateral pleural effusions.  Original Report Authenticated By: Jamesetta Orleans. MATTERN, M.D.   Ct Head Wo Contrast  03/04/2011  *RADIOLOGY REPORT*  Clinical Data: Cough.  Lethargy.  Loss of balance.  CT HEAD WITHOUT CONTRAST  Technique:  Contiguous axial images were obtained from the base of the  IMPRESSION:  1.  Stable atrophy and moderate white matter disease.  This likely reflects the sequelae of chronic microvascular ischemia. 2.  Lacunar infarct of the right thalamus is likely remote. 3.  Minimal fluid in the right maxillary sinus. 4.  Atherosclerosis.  Original Report Authenticated By: Jamesetta Orleans. MATTERN, M.D.    Medications:    . allopurinol  50 mg Oral Daily  . amiodarone  200 mg Oral Daily  . aspirin EC  81 mg Oral Daily  . azithromycin  500 mg Intravenous Q24H  . cefTRIAXone (ROCEPHIN) IVPB 1 gram/50 mL D5W  1 g Intravenous Q24H  . guaiFENesin  600 mg Oral BID  . levothyroxine  75 mcg Oral Daily  . metoprolol  75 mg Oral BID  . mulitivitamin with  minerals  1 tablet Oral Daily  . olmesartan  40 mg Oral Daily  . potassium chloride  40 mEq Oral Once  . pravastatin  20 mg Oral q1800  . sodium chloride  3 mL Intravenous Q12H  . warfarin  2.5 mg Oral ONCE-1800  . warfarin  2.5 mg Oral ONCE-1800  . white petrolatum      . DISCONTD: azithromycin (ZITHROMAX) 500 MG IVPB  500 mg Intravenous Once  . DISCONTD: cefTRIAXone (ROCEPHIN)  IV  1 g Intravenous Once  . DISCONTD: cefTRIAXone (ROCEPHIN) IVPB 1 gram/50 mL D5W  1 g Intravenous Q24H  . DISCONTD: NON FORMULARY 20 mg  20 mg Oral Daily  . DISCONTD: tolvaptan  15 mg Oral Daily    acetaminophen, cetaphil, ipratropium, levalbuterol, LORazepam, morphine injection, nitroGLYCERIN, ondansetron (ZOFRAN) IV, polyvinyl alcohol, sodium chloride, triamcinolone cream, zolpidem, DISCONTD: sodium chloride, DISCONTD: Bromfenac Sodium     . dextrose 40 mL/hr at 03/05/11 0550    Assessment/Plan:  Principal Problem:  *Pneumonia Improving, continue Rocephin, Zithromax and nebs when necessary Active Problems:  Hyponatremia Probably multifactorial, clinically patient is volume overloaded, pro BNP is elevated. Urine sodium less than 10 is more consistent with dehydration which does not correlate with clinical exam. Patient given one dose of Samsca 15 mg, serum sodium increased from 109 to 121 in a 24-hour period, slightly above the goal of 10 meq .D5W ,was used gently to slow the rise ,sodium level now stabilized at 120 ,will d/c D5W and continue to monitor Na level q 6 h   Atrial fibrillation  rate controlled, continue metoprolol, amiodarone and Coumadin as per pharmacy.  Status post Fall possibly secondary to hyponatremia PT consult  HTN (hypertension) Controlled continue meds  Hypothyroidism TSH within normal range, continue Synthroid.   LOS: 1 day   Kryslyn Helbig 03/05/2011, 1:20 PM

## 2011-03-05 NOTE — Progress Notes (Signed)
ANTICOAGULATION CONSULT NOTE - Follow Up Consult  Pharmacy Consult for Coumadin Indication: atrial fibrillation  Allergies  Allergen Reactions  . Caduet (Amlodipine-Atorvastatin)     Weakness   . Lipitor (Atorvastatin Calcium)     Muscle pain  . Simvastatin     Muscle pain    Patient Measurements: Height: 5\' 8"  (172.7 cm) Weight: 166 lb 14.2 oz (75.7 kg) IBW/kg (Calculated) : 68.4   Vital Signs: Temp: 98.7 F (37.1 C) (12/29 0800) Temp src: Oral (12/29 0800) BP: 134/93 mmHg (12/29 0942) Pulse Rate: 68  (12/29 0942)  Labs:  Basename 03/05/11 0345 03/04/11 0959  HGB 15.1 15.4  HCT 40.6 40.9  PLT 181 179  APTT -- --  LABPROT 21.2* 21.0*  INR 1.80* 1.78*  HEPARINUNFRC -- --  CREATININE 0.74 0.70  CKTOTAL -- --  CKMB -- --  TROPONINI -- --   Estimated Creatinine Clearance: 62.9 ml/min (by C-G formula based on Cr of 0.74).  Assessment:    Small increase in INR after 2.5 mg Coumadin 12/28 instead of usual Friday dose of 1.25 mg.  On home Amiodarone 200 mg daily & Aspirin 81 mg daily.     Day # 2 Ceftriaxone/Azithromycin for CAP coverage.                   No dose adjustments necessary for renal function.  Goal of Therapy:  INR 2-3   Plan:    Will repeat Coumadin 2.5 mg today.   Continue daily PT/INR.   Will consider increasing Coumadin dose 12/30 if INR not increasing.  Dennie Fetters Pager: [phone] 03/05/2011,10:19 AM

## 2011-03-06 LAB — BASIC METABOLIC PANEL
Calcium: 8.6 mg/dL (ref 8.4–10.5)
Chloride: 89 mEq/L — ABNORMAL LOW (ref 96–112)
Creatinine, Ser: 0.97 mg/dL (ref 0.50–1.35)
GFR calc Af Amer: 84 mL/min — ABNORMAL LOW (ref >90)
GFR calc non Af Amer: 72 mL/min — ABNORMAL LOW (ref >90)

## 2011-03-06 LAB — CBC
MCH: 32 pg (ref 26.0–34.0)
MCHC: 35.6 g/dL (ref 30.0–36.0)
MCV: 89.8 fL (ref 78.0–100.0)
Platelets: 183 10*3/uL (ref 150–400)
RDW: 13.2 % (ref 11.5–15.5)
WBC: 8.4 10*3/uL (ref 4.0–10.5)

## 2011-03-06 LAB — SODIUM
Sodium: 122 mEq/L — ABNORMAL LOW (ref 135–145)
Sodium: 126 mEq/L — ABNORMAL LOW (ref 135–145)

## 2011-03-06 MED ORDER — OXYMETAZOLINE HCL 0.05 % NA SOLN
1.0000 | Freq: Two times a day (BID) | NASAL | Status: DC
  Administered 2011-03-07 (×2): 1 via NASAL
  Filled 2011-03-06: qty 15

## 2011-03-06 MED ORDER — WARFARIN SODIUM 2.5 MG PO TABS
2.5000 mg | ORAL_TABLET | Freq: Once | ORAL | Status: AC
  Administered 2011-03-06: 2.5 mg via ORAL
  Filled 2011-03-06: qty 1

## 2011-03-06 MED ORDER — MOXIFLOXACIN HCL 400 MG PO TABS
400.0000 mg | ORAL_TABLET | Freq: Every day | ORAL | Status: DC
  Administered 2011-03-06 – 2011-03-07 (×2): 400 mg via ORAL
  Filled 2011-03-06 (×3): qty 1

## 2011-03-06 NOTE — Progress Notes (Signed)
Subjective: Patient seen and examined ,feeling better and denies any complaints   Objective: Vital signs in last 24 hours: Temp:  [97.1 F (36.2 C)-98.4 F (36.9 C)] 97.6 F (36.4 C) (12/30 0755) Pulse Rate:  [58-68] 66  (12/30 0755) Resp:  [11-20] 18  (12/30 0755) BP: (98-172)/(56-109) 161/66 mmHg (12/30 0755) SpO2:  [80 %-100 %] 100 % (12/30 0755) Weight:  [75 kg (165 lb 5.5 oz)] 165 lb 5.5 oz (75 kg) (12/30 0600) Weight change: -1.7 kg (-3 lb 12 oz) Last BM Date: 03/05/11  Intake/Output from previous day: 12/29 0701 - 12/30 0700 In: 1440 [P.O.:900; I.V.:240; IV Piggyback:300] Out: 3095 [Urine:3095]     Physical Exam:  Alert and oriented x3  Neck: Supple, Trachea midline normal ROM  Cardiovascular: Irregular irregular, S1 normal, S2  Pulmonary/Chest: Scattered rhonchi, left lower ribs tenderness on palpation.  Abdominal: Soft. Non-tender, non-distended, bowel sounds are normal, no masses, organomegaly, or guarding present.  Ext: Trace pedal edema and no cyanosis, pulses palpable bilaterally  Neurological: A&O x3,no dysarthria noted. Strenght is normal and symmetric , no focal motor deficit, sensory intact to light touch bilaterally    Lab Results: Results for orders placed during the hospital encounter of 03/04/11 (from the past 24 hour(s))  SODIUM     Status: Abnormal   Collection Time   03/05/11  8:47 AM      Component Value Range   Sodium 121 (*) 135 - 145 (mEq/L)  SODIUM     Status: Abnormal   Collection Time   03/05/11 11:40 AM      Component Value Range   Sodium 120 (*) 135 - 145 (mEq/L)  SODIUM, URINE, RANDOM     Status: Normal   Collection Time   03/05/11 11:55 AM      Component Value Range   Sodium, Ur <10    SODIUM     Status: Abnormal   Collection Time   03/05/11  3:19 PM      Component Value Range   Sodium 120 (*) 135 - 145 (mEq/L)  SODIUM     Status: Abnormal   Collection Time   03/05/11  6:17 PM      Component Value Range   Sodium 122 (*)  135 - 145 (mEq/L)  SODIUM     Status: Abnormal   Collection Time   03/05/11  8:01 PM      Component Value Range   Sodium 123 (*) 135 - 145 (mEq/L)  SODIUM     Status: Abnormal   Collection Time   03/05/11 11:40 PM      Component Value Range   Sodium 122 (*) 135 - 145 (mEq/L)  PROTIME-INR     Status: Abnormal   Collection Time   03/06/11  3:50 AM      Component Value Range   Prothrombin Time 22.7 (*) 11.6 - 15.2 (seconds)   INR 1.96 (*) 0.00 - 1.49   CBC     Status: Normal   Collection Time   03/06/11  3:50 AM      Component Value Range   WBC 8.4  4.0 - 10.5 (K/uL)   RBC 4.91  4.22 - 5.81 (MIL/uL)   Hemoglobin 15.7  13.0 - 17.0 (g/dL)   HCT 16.1  09.6 - 04.5 (%)   MCV 89.8  78.0 - 100.0 (fL)   MCH 32.0  26.0 - 34.0 (pg)   MCHC 35.6  30.0 - 36.0 (g/dL)   RDW 40.9  81.1 - 91.4 (%)  Platelets 183  150 - 400 (K/uL)  BASIC METABOLIC PANEL     Status: Abnormal   Collection Time   03/06/11  3:50 AM      Component Value Range   Sodium 126 (*) 135 - 145 (mEq/L)   Potassium 3.9  3.5 - 5.1 (mEq/L)   Chloride 89 (*) 96 - 112 (mEq/L)   CO2 32  19 - 32 (mEq/L)   Glucose, Bld 141 (*) 70 - 99 (mg/dL)   BUN 17  6 - 23 (mg/dL)   Creatinine, Ser 7.82  0.50 - 1.35 (mg/dL)   Calcium 8.6  8.4 - 95.6 (mg/dL)   GFR calc non Af Amer 72 (*) >90 (mL/min)   GFR calc Af Amer 84 (*) >90 (mL/min)  SODIUM     Status: Abnormal   Collection Time   03/06/11  5:57 AM      Component Value Range   Sodium 126 (*) 135 - 145 (mEq/L)    Studies/Results: Dg Chest 2 View  03/04/2011  *RADIOLOGY REPORT*  Clinical Data: Cough and wheezing.  CHEST - 2 VIEW  Comparison: Two-view chest 12/26/2009.  Findings: The patient is status post mediasternotomy for CABG. Bilateral perihilar airspace disease is evident within the left upper lobe and right lower lobe.  The lung volumes are low.  Small bilateral pleural effusions are present.  IMPRESSION:  1.  Bilateral perihilar airspace disease involving the left upper  lobe right lower lobe is concerning for infection. 2.  Small bilateral pleural effusions.  Original Report Authenticated By: Jamesetta Orleans. MATTERN, M.D.   Ct Head Wo Contrast  03/04/2011  *RADIOLOGY REPORT*  Clinical Data: Cough.  Lethargy.  Loss of balance.  CT HEAD WITHOUT CONTRAST  Technique:  Contiguous axial images were obtained from the base of the skull through the vertex without contrast.  Comparison: CT head without contrast 01/26/2009.  Findings: A lacunar infarct in the right thalamus is likely remote although less well seen on the prior study.  Extensive periventricular and subcortical white matter hypoattenuation is evident bilaterally.  The ventricles are normal size.  Mild generalized atrophy is likely within normal limits for age.  No significant extra-axial fluid collection is present.  A tiny fluid level is present in the right maxillary sinus.  The paranasal sinuses and mastoid air cells are otherwise clear.  The atherosclerotic calcifications are present within the cavernous carotid arteries.  The osseous skull is intact.  IMPRESSION:  1.  Stable atrophy and moderate white matter disease.  This likely reflects the sequelae of chronic microvascular ischemia. 2.  Lacunar infarct of the right thalamus is likely remote. 3.  Minimal fluid in the right maxillary sinus. 4.  Atherosclerosis.  Original Report Authenticated By: Jamesetta Orleans. MATTERN, M.D.    Medications:    . allopurinol  50 mg Oral Daily  . amiodarone  200 mg Oral Daily  . aspirin EC  81 mg Oral Daily  . azithromycin  500 mg Intravenous Q24H  . cefTRIAXone (ROCEPHIN) IVPB 1 gram/50 mL D5W  1 g Intravenous Q24H  . guaiFENesin  600 mg Oral BID  . levothyroxine  75 mcg Oral Daily  . metoprolol  75 mg Oral BID  . mulitivitamin with minerals  1 tablet Oral Daily  . olmesartan  40 mg Oral Daily  . pravastatin  20 mg Oral q1800  . sodium chloride  3 mL Intravenous Q12H  . warfarin  2.5 mg Oral ONCE-1800     acetaminophen, cetaphil, ipratropium, levalbuterol, LORazepam,  morphine injection, nitroGLYCERIN, ondansetron (ZOFRAN) IV, polyvinyl alcohol, sodium chloride, triamcinolone cream, zolpidem     . DISCONTD: dextrose 40 mL/hr at 03/05/11 0550    Assessment/Plan: Principal Problem:  *Pneumonia  Improving, change IV antibiotics to po Avelox  Active Problems:  Hyponatremia  Probably multifactorial, clinically patient is volume overloaded, pro BNP is elevated. Urine sodium less than 10 is more consistent with dehydration which does not correlate with clinical exam.  Patient given one dose of Samsca 15 mg on 12/28,serum sodium increased within appropriate target ,will continue to monitor  Atrial fibrillation  rate controlled, continue metoprolol, amiodarone and Coumadin as per pharmacy.  Status post Fall possibly secondary to hyponatremia  PT consult  HTN (hypertension)  Controlled continue meds  Hypothyroidism  TSH within normal range, continue Synthroid.  Transfer to telemetry floor      LOS: 2 days   Michel Hendon 03/06/2011, 8:20 AM

## 2011-03-06 NOTE — Progress Notes (Signed)
ANTICOAGULATION CONSULT NOTE - Follow Up Consult  Pharmacy Consult for Coumadin Indication: atrial fibrillation  Allergies  Allergen Reactions  . Caduet (Amlodipine-Atorvastatin)     Weakness   . Lipitor (Atorvastatin Calcium)     Muscle pain  . Simvastatin     Muscle pain    Patient Measurements: Height: 5\' 8"  (172.7 cm) Weight: 164 lb 11.2 oz (74.707 kg) (scale b) IBW/kg (Calculated) : 68.4   Vital Signs: Temp: 97.4 F (36.3 C) (12/30 1141) Temp src: Oral (12/30 1141) BP: 138/84 mmHg (12/30 1141) Pulse Rate: 62  (12/30 1141)  Labs:  Basename 03/06/11 0350 03/05/11 0345 03/04/11 0959  HGB 15.7 15.1 --  HCT 44.1 40.6 40.9  PLT 183 181 179  APTT -- -- --  LABPROT 22.7* 21.2* 21.0*  INR 1.96* 1.80* 1.78*  HEPARINUNFRC -- -- --  CREATININE 0.97 0.74 0.70  CKTOTAL -- -- --  CKMB -- -- --  TROPONINI -- -- --   Estimated Creatinine Clearance: 51.9 ml/min (by C-G formula based on Cr of 0.97).  Assessment:    INR almost therapeutic today.  On home Amiodarone 200 mg daily & Aspirin 81 mg daily.     Goal of Therapy:  INR 2-3   Plan:  Coumadin 2.5mg  PO x1 today Cont daily PT/INR  [place] Pager: [phone] 03/06/2011,1:20 PM

## 2011-03-07 LAB — BASIC METABOLIC PANEL
BUN: 18 mg/dL (ref 6–23)
CO2: 28 mEq/L (ref 19–32)
Calcium: 8.3 mg/dL — ABNORMAL LOW (ref 8.4–10.5)
Creatinine, Ser: 0.89 mg/dL (ref 0.50–1.35)
GFR calc non Af Amer: 75 mL/min — ABNORMAL LOW (ref >90)
Glucose, Bld: 102 mg/dL — ABNORMAL HIGH (ref 70–99)

## 2011-03-07 LAB — CULTURE, RESPIRATORY W GRAM STAIN

## 2011-03-07 MED ORDER — FUROSEMIDE 10 MG/ML IJ SOLN
40.0000 mg | Freq: Once | INTRAMUSCULAR | Status: AC
  Administered 2011-03-07: 40 mg via INTRAVENOUS
  Filled 2011-03-07: qty 4

## 2011-03-07 MED ORDER — WARFARIN 1.25 MG HALF TABLET
1.2500 mg | ORAL_TABLET | Freq: Once | ORAL | Status: AC
  Administered 2011-03-07: 1.25 mg via ORAL
  Filled 2011-03-07: qty 1

## 2011-03-07 NOTE — Progress Notes (Signed)
ANTICOAGULATION CONSULT NOTE - Follow Up Consult  Pharmacy Consult for Coumadin Indication: atrial fibrillation  Allergies  Allergen Reactions  . Caduet (Amlodipine-Atorvastatin)     Weakness   . Lipitor (Atorvastatin Calcium)     Muscle pain  . Simvastatin     Muscle pain    Patient Measurements: Height: 5\' 8"  (172.7 cm) Weight: 161 lb 14.4 oz (73.437 kg) IBW/kg (Calculated) : 68.4   Vital Signs: Temp: 97.5 F (36.4 C) (12/31 0549) BP: 114/56 mmHg (12/31 1028) Pulse Rate: 60  (12/31 1028)  Labs:  Basename 03/07/11 0500 03/06/11 0350 03/05/11 0345  HGB -- 15.7 15.1  HCT -- 44.1 40.6  PLT -- 183 181  APTT -- -- --  LABPROT 25.6* 22.7* 21.2*  INR 2.29* 1.96* 1.80*  HEPARINUNFRC -- -- --  CREATININE 0.89 0.97 0.74  CKTOTAL -- -- --  CKMB -- -- --  TROPONINI -- -- --   Estimated Creatinine Clearance: 56.6 ml/min (by C-G formula based on Cr of 0.89).  Assessment: INR is therapeutic today. Patient also on day 2 Avelox which may increase INR. On home Amiodarone 200 mg daily & Aspirin 81 mg daily.  Home Coumadin dosing: 2.5 mg daily except 1.25 mg Mon, Fri     Goal of Therapy:  INR 2-3   Plan:  1. Coumadin 1.25mg  PO x1 today 2. Cont daily PT/INR  [place], Jon [name] 03/07/2011,1:23 PM

## 2011-03-07 NOTE — Progress Notes (Signed)
Subjective: Patient seen and examined,denies any complaints and feeling better   Objective: Vital signs in last 24 hours: Temp:  [97.3 F (36.3 C)-98 F (36.7 C)] 97.5 F (36.4 C) (12/31 0549) Pulse Rate:  [61-83] 62  (12/31 0549) Resp:  [15-18] 18  (12/31 0549) BP: (121-157)/(57-84) 157/84 mmHg (12/31 0549) SpO2:  [94 %-96 %] 94 % (12/31 0549) Weight:  [73.437 kg (161 lb 14.4 oz)-74.707 kg (164 lb 11.2 oz)] 161 lb 14.4 oz (73.437 kg) (12/31 0549) Weight change: -0.293 kg (-10.3 oz) Last BM Date: 03/05/11  Intake/Output from previous day: 12/30 0701 - 12/31 0700 In: 480 [P.O.:480] Out: 1075 [Urine:1075] Total I/O In: 120 [P.O.:120] Out: -    Physical Exam: Alert and oriented x3  Neck: Supple, Trachea midline normal ROM  Cardiovascular: Irregular irregular, S1 normal, S2  Pulmonary/Chest: Scattered rhonchi, left lower ribs tenderness on palpation.  Abdominal: Soft. Non-tender, non-distended, bowel sounds are normal, no masses, organomegaly, or guarding present.  Ext: no edema no cyanosis, pulses palpable bilaterally  Neurological: A&O x3,no dysarthria noted. Strenght is normal and symmetric , no focal motor deficit, sensory intact to light touch bilaterally     Lab Results: Results for orders placed during the hospital encounter of 03/04/11 (from the past 24 hour(s))  PROTIME-INR     Status: Abnormal   Collection Time   03/07/11  5:00 AM      Component Value Range   Prothrombin Time 25.6 (*) 11.6 - 15.2 (seconds)   INR 2.29 (*) 0.00 - 1.49   BASIC METABOLIC PANEL     Status: Abnormal   Collection Time   03/07/11  5:00 AM      Component Value Range   Sodium 127 (*) 135 - 145 (mEq/L)   Potassium 3.6  3.5 - 5.1 (mEq/L)   Chloride 89 (*) 96 - 112 (mEq/L)   CO2 28  19 - 32 (mEq/L)   Glucose, Bld 102 (*) 70 - 99 (mg/dL)   BUN 18  6 - 23 (mg/dL)   Creatinine, Ser 1.47  0.50 - 1.35 (mg/dL)   Calcium 8.3 (*) 8.4 - 10.5 (mg/dL)   GFR calc non Af Amer 75 (*) >90 (mL/min)    GFR calc Af Amer 87 (*) >90 (mL/min)    Studies/Results: No results found.  Medications:    . allopurinol  50 mg Oral Daily  . amiodarone  200 mg Oral Daily  . aspirin EC  81 mg Oral Daily  . guaiFENesin  600 mg Oral BID  . levothyroxine  75 mcg Oral Daily  . metoprolol  75 mg Oral BID  . moxifloxacin  400 mg Oral q1800  . mulitivitamin with minerals  1 tablet Oral Daily  . olmesartan  40 mg Oral Daily  . oxymetazoline  1 spray Each Nare BID  . pravastatin  20 mg Oral q1800  . sodium chloride  3 mL Intravenous Q12H  . warfarin  2.5 mg Oral ONCE-1800    acetaminophen, cetaphil, ipratropium, levalbuterol, LORazepam, morphine injection, nitroGLYCERIN, ondansetron (ZOFRAN) IV, polyvinyl alcohol, sodium chloride, triamcinolone cream, zolpidem     Assessment/Plan: Principal Problem:  *Pneumonia  Improving, continue  po Avelox  Active Problems:  Hyponatremia  Probably multifactorial, clinically patient is volume overloaded, pro BNP is elevated. Urine sodium less than 10 is more consistent with dehydration which does not correlate with clinical exam.  Patient given one dose of Samsca 15 mg on 12/28,serum sodium increased within appropriate target ,will continue to monitor .will give one  dose of lasix today ,target Na by tomorrow 130  Atrial fibrillation  rate controlled, continue metoprolol, amiodarone and Coumadin as per pharmacy.  Status post Fall possibly secondary to hyponatremia  PT consulted ,eval pending  HTN (hypertension)  Controlled continue meds  Hypothyroidism  TSH within normal range, continue Synthroid.  Disposition: Pending PT eval,and Na level 130 or above .       LOS: 3 days   Jon [name] 03/07/2011, 10:29 AM

## 2011-03-07 NOTE — Progress Notes (Signed)
Physical Therapy Evaluation Patient Details Name: Jon [name] MRN: [id number] DOB: [birth year] Today's Date: 03/07/2011  Problem List:  Patient Active Problem List  Diagnoses  . Pneumonia  . Hyponatremia  . [place]ath-induced atrial fibrillation  . Fall  . HTN (hypertension)  . Hypothyroidism    Past Medical History:  Past Medical History  Diagnosis Date  . Hypertension   . Coronary artery disease    Past Surgical History:  Past Surgical History  Procedure Date  . Coronary artery bypass graft   . Pacemaker insertion   . Insert / replace / remove pacemaker     PT Assessment/Plan/Recommendation PT Assessment Clinical Impression Statement: Pt is a 77 y/o male admitted for SOB. Pt moving well but needed extra support with using RW.  Pt SaO2 maintained 93% on RA during ambulation.  Pt will benefir from acute PT services to improve overall mobility and endurance to prepare for safe d/c home with spouse and HHPT.  Pt's gait speed ~.820ft/sec increasing pt's fall risk. PT Recommendation/Assessment: Patient will need skilled PT in the acute care venue PT Problem List: Decreased mobility;Decreased balance;Decreased activity tolerance;Decreased knowledge of use of DME PT Therapy Diagnosis : Difficulty walking;Generalized weakness PT Plan PT Frequency: Min 3X/week PT Treatment/Interventions: DME instruction;Gait training;Functional mobility training;Therapeutic activities;Therapeutic exercise;Balance training;Patient/family education PT Recommendation Follow Up Recommendations: Home health PT Equipment Recommended: Rolling walker with 5" wheels PT Goals  Acute Rehab PT Goals PT Goal Formulation: With patient/family Time For Goal Achievement: 7 days Pt will go Sit to Stand: with modified independence PT Goal: Sit to Stand - Progress: Not met Pt will go Stand to Sit: with modified independence PT Goal: Stand to Sit - Progress: Not met Pt will Ambulate: >150 feet;with modified  independence;with least restrictive assistive device;with gait velocity >(comment) ft/second PT Goal: Ambulate - Progress: Not met  PT Evaluation Precautions/Restrictions    Prior Functioning  Home Living Lives With: Spouse Receives Help From: Family Type of Home: Apartment Home Layout: One level Home Access: Level entry Bathroom Shower/Tub: Engineer, manufacturing systems: Standard Bathroom Accessibility: Yes How Accessible: Accessible via walker Home Adaptive Equipment: Walker - rolling;Straight cane Prior Function Level of Independence: Independent with gait;Independent with transfers Able to Take Stairs?: No Driving: Yes Cognition Cognition Arousal/Alertness: Awake/alert Overall Cognitive Status: Appears within functional limits for tasks assessed Orientation Level: Oriented X4 Sensation/Coordination   Extremity Assessment RUE Assessment RUE Assessment: Within Functional Limits LUE Assessment LUE Assessment: Within Functional Limits RLE Assessment RLE Assessment: Within Functional Limits LLE Assessment LLE Assessment: Within Functional Limits Mobility (including Balance) Bed Mobility Bed Mobility: Yes Supine to Sit: 5: Supervision Supine to Sit Details (indicate cue type and reason): Supervision for safety. Transfers Transfers: Yes Sit to Stand: 4: Min assist;From bed (minguard for safety) Sit to Stand Details (indicate cue type and reason): Minguard for safety.  VCs for hand placement. Stand to Sit: 4: Min assist;To chair/3-in-1 (minguard for safety) Stand to Sit Details: minguard for safety with VCs for safety Ambulation/Gait Ambulation/Gait: Yes Ambulation/Gait Assistance: 5: Supervision Ambulation/Gait Assistance Details (indicate cue type and reason): Supervision for safety with cues to stand upright and proper technique with RW during turns Ambulation Distance (Feet): 200 Feet Assistive device: Rolling walker Gait Pattern: Trunk flexed Gait velocity:  .8333 Stairs: No  Balance Balance Assessed: Yes Static Sitting Balance Static Sitting - Balance Support: Feet supported Static Sitting - Level of Assistance: 7: Independent Static Sitting - Comment/# of Minutes: ~ 5 minutes to prepare for transfer Exercise  End of Session PT - End of Session Equipment Utilized During Treatment: Gait belt Activity Tolerance: Patient tolerated treatment well Patient left: in chair;with call bell in reach;with family/visitor present Nurse Communication: Mobility status for transfers;Mobility status for ambulation General Behavior During Session: [facility] for tasks performed  Novalyn Lajara 03/07/2011, 3:20 PM [phone]

## 2011-03-08 LAB — BASIC METABOLIC PANEL
BUN: 20 mg/dL (ref 6–23)
Creatinine, Ser: 1.01 mg/dL (ref 0.50–1.35)
GFR calc Af Amer: 75 mL/min — ABNORMAL LOW (ref >90)
GFR calc non Af Amer: 65 mL/min — ABNORMAL LOW (ref >90)
Glucose, Bld: 116 mg/dL — ABNORMAL HIGH (ref 70–99)

## 2011-03-08 LAB — CBC
HCT: 44.3 % (ref 39.0–52.0)
Hemoglobin: 16.2 g/dL (ref 13.0–17.0)
MCHC: 36.6 g/dL — ABNORMAL HIGH (ref 30.0–36.0)
MCV: 90.8 fL (ref 78.0–100.0)
RDW: 13.5 % (ref 11.5–15.5)

## 2011-03-08 MED ORDER — POTASSIUM CHLORIDE ER 10 MEQ PO TBCR: 10.0000 meq | EXTENDED_RELEASE_TABLET | Freq: Two times a day (BID) | ORAL | Status: DC

## 2011-03-08 MED ORDER — WARFARIN SODIUM 2.5 MG PO TABS
2.5000 mg | ORAL_TABLET | Freq: Once | ORAL | Status: DC
  Filled 2011-03-08: qty 1

## 2011-03-08 MED ORDER — MOXIFLOXACIN HCL 400 MG PO TABS: 400.0000 mg | ORAL_TABLET | Freq: Every day | ORAL | Status: AC

## 2011-03-08 MED ORDER — FUROSEMIDE 40 MG PO TABS: 40.0000 mg | ORAL_TABLET | Freq: Every day | ORAL | Status: DC

## 2011-03-08 NOTE — Progress Notes (Signed)
ANTICOAGULATION CONSULT NOTE - Follow Up Consult  Pharmacy Consult for Coumadin Indication: atrial fibrillation  Assessment: 78 yo male on coumadin PTA for afib. INR remains therapeutic today at 2.31. Patient also on day #3 Avelox which may increase INR. On home Amiodarone 200 mg daily & Aspirin 81 mg daily. No bleeding issues reported. Home Coumadin dosing: 2.5 mg daily except 1.25 mg Mon & Fri  Pharmacist System-Based Medication Review: Admit Complaint: Cough and shortness of breath  Infectious Disease: D#5 Abx, D#3 Avelox for CAP, afebrile, WBC 8.6, RCx unreliable Cardiovascular: Volume overloaded. Hx HLD,Afib, HTN on metoprolol, amio, warfarin, ASA, benicar, pravastatin (intol to other statins). Lasix x1 dose today. VSS Nephrology: Hyponatremia improved. Failed hypertonic saline PTA. SCr stable, UoP (0.2mL/kg/hr) Pulmonary: Admitted with CAP, 96% RA PTA Medication Issues - off home HCTZ (Na 109 on admit!) Best Practices: Coumadin, no GI prophylaxis (none pta)   Goal of Therapy:  INR 2-3   Plan:  Coumadin 2.5mg  PO x1 today. Check INR in AM.  Ival Bible 03/08/2011,11:02 AM     Allergies  Allergen Reactions  . Caduet (Amlodipine-Atorvastatin)     Weakness   . Lipitor (Atorvastatin Calcium)     Muscle pain  . Simvastatin     Muscle pain    Patient Measurements: Height: 5\' 8"  (172.7 cm) Weight: 161 lb 14.4 oz (73.437 kg) IBW/kg (Calculated) : 68.4   Vital Signs: Temp: 98.4 F (36.9 C) (01/01 0608) Temp src: Oral (01/01 0608) BP: 144/58 mmHg (01/01 0608) Pulse Rate: 84  (01/01 0608)  Labs:  Basename 03/08/11 0700 03/07/11 0500 03/06/11 0350  HGB 16.2 -- 15.7  HCT 44.3 -- 44.1  PLT 266 -- 183  APTT -- -- --  LABPROT 25.8* 25.6* 22.7*  INR 2.31* 2.29* 1.96*  HEPARINUNFRC -- -- --  CREATININE 1.01 0.89 0.97  CKTOTAL -- -- --  CKMB -- -- --  TROPONINI -- -- --   Estimated Creatinine Clearance: 49.9 ml/min (by C-G formula based on Cr of  1.01).

## 2011-03-08 NOTE — Progress Notes (Signed)
According to [facility], patient is fine to return to facility. They are aware of the order for PT and will have their therapist evaluate for treatment. Dr. Cleotis Lema has been made aware.

## 2011-03-08 NOTE — Discharge Summary (Signed)
Patient ID: Jon [name] MRN: [id number] DOB/AGE: 1921/10/16 78 y.o.  Admit date: 03/04/2011 Discharge date: 03/08/2011  Primary Care Physician:  No primary provider on file.   Discharge Diagnoses:    Present on Admission:  .Pneumonia .Hyponatremia .Campath-induced atrial fibrillation .HTN (hypertension) .Hypothyroidism  Current Discharge Medication List    START taking these medications   Details  furosemide (LASIX) 40 MG tablet Take 1 tablet (40 mg total) by mouth daily. Qty: 30 tablet, Refills: 0    moxifloxacin (AVELOX) 400 MG tablet Take 1 tablet (400 mg total) by mouth daily. Qty: 3 tablet, Refills: 0    potassium chloride (K-DUR) 10 MEQ tablet Take 1 tablet (10 mEq total) by mouth 2 (two) times daily. Qty: 60 tablet, Refills: 0      CONTINUE these medications which have NOT CHANGED   Details  allopurinol (ZYLOPRIM) 100 MG tablet Take 50 mg by mouth daily.      amiodarone (PACERONE) 200 MG tablet Take 200 mg by mouth daily.      aspirin EC 81 MG tablet Take 81 mg by mouth daily.      Bromfenac Sodium (BROMDAY) 0.09 % SOLN Apply 1 drop to eye daily as needed. In left eye.  For dry eyes     cetaphil (CETAPHIL) lotion Apply 1 application topically daily as needed. Compounded with triamcinolone cream 1% (1:3 ratio).  For rash     levothyroxine (SYNTHROID, LEVOTHROID) 75 MCG tablet Take 75 mcg by mouth daily.      LORazepam (ATIVAN) 0.5 MG tablet Take 0.5 mg by mouth daily as needed. For nerves     metoprolol (LOPRESSOR) 50 MG tablet Take 75 mg by mouth 2 (two) times daily.      Multiple Vitamin (MULITIVITAMIN WITH MINERALS) TABS Take 1 tablet by mouth daily.      OVER THE COUNTER MEDICATION Take 1 tablet by mouth daily as needed. qc allergy relief multi symptom.  For allergies     pravastatin (PRAVACHOL) 20 MG tablet Take 10 mg by mouth daily.      triamcinolone cream (KENALOG) 0.1 % Apply 1 application topically daily as needed. Compounded with cetaphil  lotion (3:1 ratio).  For rash     valsartan (DIOVAN) 320 MG tablet Take 320 mg by mouth at bedtime.      warfarin (COUMADIN) 2.5 MG tablet Take 1.25-2.5 mg by mouth daily. Take 1 tablet daily except take 1/2 tablet on Monday and friday     zolpidem (AMBIEN) 10 MG tablet Take 5-10 mg by mouth at bedtime as needed. For sleep     nitroGLYCERIN (NITROSTAT) 0.4 MG SL tablet Place 0.4 mg under the tongue every 5 (five) minutes as needed. For chest pain       STOP taking these medications     doxycycline (VIBRA-TABS) 100 MG tablet      hydrochlorothiazide (HYDRODIURIL) 25 MG tablet          Consults: None    Significant Diagnostic Studies:  Dg Chest 2 View  03/04/2011  *RADIOLOGY REPORT*  Clinical Data: Cough and wheezing.  CHEST - 2 VIEW  Comparison: Two-view chest 12/26/2009.  Findings: The patient is status post mediasternotomy for CABG. Bilateral perihilar airspace disease is evident within the left upper lobe and right lower lobe.  The lung volumes are low.  Small bilateral pleural effusions are present.  IMPRESSION:  1.  Bilateral perihilar airspace disease involving the left upper lobe right lower lobe is concerning for infection. 2.  Small  bilateral pleural effusions.  Original Report Authenticated By: Jamesetta Orleans. MATTERN, M.D.   Ct Head Wo Contrast  03/04/2011  *RADIOLOGY REPORT*  Clinical Data: Cough.  Lethargy.  Loss of balance.  CT HEAD WITHOUT CONTRAST  Technique:  Contiguous axial images were obtained from the base of the skull through the vertex without contrast.  Comparison: CT head without contrast 01/26/2009.  Findings: A lacunar infarct in the right thalamus is likely remote although less well seen on the prior study.  Extensive periventricular and subcortical white matter hypoattenuation is evident bilaterally.  The ventricles are normal size.  Mild generalized atrophy is likely within normal limits for age.  No significant extra-axial fluid collection is present.  A  tiny fluid level is present in the right maxillary sinus.  The paranasal sinuses and mastoid air cells are otherwise clear.  The atherosclerotic calcifications are present within the cavernous carotid arteries.  The osseous skull is intact.  IMPRESSION:  1.  Stable atrophy and moderate white matter disease.  This likely reflects the sequelae of chronic microvascular ischemia. 2.  Lacunar infarct of the right thalamus is likely remote. 3.  Minimal fluid in the right maxillary sinus. 4.  Atherosclerosis.  Original Report Authenticated By: Jamesetta Orleans. MATTERN, M.D.    Brief H and P: For complete details please refer to admission H and P, but in brief  78 years old Caucasian pleasant man with multiple comorbidities was brought into the ER today chief complaint cough cough and shortness of breath for about 5 days. He described his cough as dry, associated with shortness of breath however denies any fever or chills. He was seen at urgent care facility and was prescribed amoxicillin, however he was unable to tolerate it because of nausea and his antibiotic was switched to Doxycycline and by his BCP Dr. Chilton Si. As per family member he was also noted to be off balance and fell on his knees couple of days ago. His speech is noted to be slow and family had some concern about possible left facial droop. In the ED patient had chest x-ray done that showed finding concerning for pneumonia, CT head showed no acute findings and labs showed severe hyponatremia with sodium level of 109. He was given Zithromax and Rocephin and 500 mL of normal saline and we were asked to admit for further management. Patient was also complaining of increasing abdominal girth and mild lower extremity edema.  Hospital Course:  Principal Problem:  *Pneumonia  Improved, was initially treated with Zithromax and Rocephin then transitioned to by mouth Avelox  continue po Avelox for 3 more days to complete 7 days course. Active Problems:    Hyponatremia  Probably multifactorial, clinically patient is volume overloaded, pro BNP is elevated. Urine sodium less than 10 is more consistent with dehydration but clearly does not correlate with clinical exam and I personally think it was inaccurate. Patient given one dose of Samsca 15 mg on 12/28,serum sodium increased within appropriate target , then given one dose of IV Lasix 40 mg yesterday, sodium level of130 today,  on review of his records sodium level was previously ranging between 131 -134. Patient instructed to restrict his fluid intake to 1.5 L a day. Obviously he should never be on hydrochlorothiazide again, I will discharge him on Lasix. Patient and family advised to follow with PCP and cardiologist ASAP, repeat labs 2 days to reassess his sodium level and also to check on PT/INR. They verbalized understanding, patient has a very supportive and  reliable family . Atrial fibrillation  rate controlled, continue metoprolol, amiodarone and Coumadin was managed by  pharmacy. His INR is currently therapeutic, will DC on Coumadin outpatient dose, patient to follow with PCP/cardiologist for repeat PT/INR as above. Status post Fall possibly secondary to hyponatremia  PT consulted , they recommended home PT, I spoke to case manager who [facility] and [facility] will be switched to the assisted  side of the facility. RX for PT was given to patient. HTN (hypertension)  Controlled continue meds  Hypothyroidism  TSH within normal range, continue Synthroid.   Subjective Patient seen and examined., Denies any complaints and feeling much better.    Filed Vitals:   03/08/11 0608  BP: 144/58  Pulse: 84  Temp: 98.4 F (36.9 C)  Resp: 18     Alert and oriented x3  Neck: Supple, Trachea midline normal ROM  Cardiovascular: Irregular irregular, S1 normal, S2  Pulmonary/Chest: Clear to auscultation bilaterally Abdominal: Soft. Non-tender, non-distended, bowel sounds are normal, no  masses, organomegaly, or guarding present.  Ext: no edema no cyanosis, pulses palpable bilaterally  Neurological: A&O x3,no dysarthria noted. Strenght is normal and symmetric , no focal motor deficit, sensory intact to light touch bilaterally   Disposition and Follow-up:  [facility] Repeat BMP, PT/INR 2 days Follow with PCP/cardiologist ASAP Restrict fluid intake to 1.5 L a day   Time spent on Discharge: 45 minutes   Signed: Kamali Nephew 03/08/2011, 11:15 AM

## 2011-03-08 NOTE — Progress Notes (Signed)
03/08/11 1210 Nursing Note: Pt discharged per MD's order. Pt has received all discharge information and verbalizes understanding. Pt's IV and cardiac monitor has been discontinued per MD's order. Pt has been educated on discharge medications, follow ups. Will escort pt to car safely. Zaveon Gillen Scientist, clinical (histocompatibility and immunogenetics).

## 2011-03-10 DIAGNOSIS — I4891 Unspecified atrial fibrillation: Secondary | ICD-10-CM | POA: Diagnosis not present

## 2011-03-10 DIAGNOSIS — I5022 Chronic systolic (congestive) heart failure: Secondary | ICD-10-CM | POA: Diagnosis not present

## 2011-03-10 DIAGNOSIS — M6281 Muscle weakness (generalized): Secondary | ICD-10-CM

## 2011-03-10 DIAGNOSIS — I2510 Atherosclerotic heart disease of native coronary artery without angina pectoris: Secondary | ICD-10-CM | POA: Diagnosis not present

## 2011-03-10 HISTORY — DX: Muscle weakness (generalized): M62.81

## 2011-03-11 DIAGNOSIS — D649 Anemia, unspecified: Secondary | ICD-10-CM | POA: Diagnosis not present

## 2011-03-11 DIAGNOSIS — J189 Pneumonia, unspecified organism: Secondary | ICD-10-CM | POA: Diagnosis not present

## 2011-03-11 DIAGNOSIS — I10 Essential (primary) hypertension: Secondary | ICD-10-CM | POA: Diagnosis not present

## 2011-03-11 LAB — CULTURE, BLOOD (ROUTINE X 2)
Culture  Setup Time: 201212290137
Culture: NO GROWTH

## 2011-03-15 DIAGNOSIS — M6281 Muscle weakness (generalized): Secondary | ICD-10-CM | POA: Diagnosis not present

## 2011-03-15 DIAGNOSIS — R279 Unspecified lack of coordination: Secondary | ICD-10-CM | POA: Diagnosis not present

## 2011-03-15 DIAGNOSIS — R269 Unspecified abnormalities of gait and mobility: Secondary | ICD-10-CM | POA: Diagnosis not present

## 2011-03-15 DIAGNOSIS — Z9181 History of falling: Secondary | ICD-10-CM | POA: Diagnosis not present

## 2011-03-15 DIAGNOSIS — J13 Pneumonia due to Streptococcus pneumoniae: Secondary | ICD-10-CM | POA: Diagnosis not present

## 2011-03-16 DIAGNOSIS — M6281 Muscle weakness (generalized): Secondary | ICD-10-CM | POA: Diagnosis not present

## 2011-03-16 DIAGNOSIS — J13 Pneumonia due to Streptococcus pneumoniae: Secondary | ICD-10-CM | POA: Diagnosis not present

## 2011-03-16 DIAGNOSIS — R269 Unspecified abnormalities of gait and mobility: Secondary | ICD-10-CM | POA: Diagnosis not present

## 2011-03-16 DIAGNOSIS — R279 Unspecified lack of coordination: Secondary | ICD-10-CM | POA: Diagnosis not present

## 2011-03-16 DIAGNOSIS — Z9181 History of falling: Secondary | ICD-10-CM | POA: Diagnosis not present

## 2011-03-17 DIAGNOSIS — J189 Pneumonia, unspecified organism: Secondary | ICD-10-CM | POA: Diagnosis not present

## 2011-03-17 DIAGNOSIS — R040 Epistaxis: Secondary | ICD-10-CM | POA: Diagnosis not present

## 2011-03-17 DIAGNOSIS — E871 Hypo-osmolality and hyponatremia: Secondary | ICD-10-CM | POA: Diagnosis not present

## 2011-03-17 DIAGNOSIS — Z7901 Long term (current) use of anticoagulants: Secondary | ICD-10-CM

## 2011-03-17 HISTORY — DX: Long term (current) use of anticoagulants: Z79.01

## 2011-03-18 DIAGNOSIS — J13 Pneumonia due to Streptococcus pneumoniae: Secondary | ICD-10-CM | POA: Diagnosis not present

## 2011-03-18 DIAGNOSIS — Z9181 History of falling: Secondary | ICD-10-CM | POA: Diagnosis not present

## 2011-03-18 DIAGNOSIS — R279 Unspecified lack of coordination: Secondary | ICD-10-CM | POA: Diagnosis not present

## 2011-03-18 DIAGNOSIS — R269 Unspecified abnormalities of gait and mobility: Secondary | ICD-10-CM | POA: Diagnosis not present

## 2011-03-18 DIAGNOSIS — M6281 Muscle weakness (generalized): Secondary | ICD-10-CM | POA: Diagnosis not present

## 2011-03-21 DIAGNOSIS — R269 Unspecified abnormalities of gait and mobility: Secondary | ICD-10-CM | POA: Diagnosis not present

## 2011-03-21 DIAGNOSIS — R279 Unspecified lack of coordination: Secondary | ICD-10-CM | POA: Diagnosis not present

## 2011-03-21 DIAGNOSIS — J13 Pneumonia due to Streptococcus pneumoniae: Secondary | ICD-10-CM | POA: Diagnosis not present

## 2011-03-21 DIAGNOSIS — Z9181 History of falling: Secondary | ICD-10-CM | POA: Diagnosis not present

## 2011-03-21 DIAGNOSIS — M6281 Muscle weakness (generalized): Secondary | ICD-10-CM | POA: Diagnosis not present

## 2011-03-24 DIAGNOSIS — Z9181 History of falling: Secondary | ICD-10-CM | POA: Diagnosis not present

## 2011-03-24 DIAGNOSIS — R279 Unspecified lack of coordination: Secondary | ICD-10-CM | POA: Diagnosis not present

## 2011-03-24 DIAGNOSIS — R269 Unspecified abnormalities of gait and mobility: Secondary | ICD-10-CM | POA: Diagnosis not present

## 2011-03-24 DIAGNOSIS — J13 Pneumonia due to Streptococcus pneumoniae: Secondary | ICD-10-CM | POA: Diagnosis not present

## 2011-03-24 DIAGNOSIS — M6281 Muscle weakness (generalized): Secondary | ICD-10-CM | POA: Diagnosis not present

## 2011-03-25 DIAGNOSIS — J13 Pneumonia due to Streptococcus pneumoniae: Secondary | ICD-10-CM | POA: Diagnosis not present

## 2011-03-25 DIAGNOSIS — Z9181 History of falling: Secondary | ICD-10-CM | POA: Diagnosis not present

## 2011-03-25 DIAGNOSIS — I4891 Unspecified atrial fibrillation: Secondary | ICD-10-CM | POA: Diagnosis not present

## 2011-03-25 DIAGNOSIS — M6281 Muscle weakness (generalized): Secondary | ICD-10-CM | POA: Diagnosis not present

## 2011-03-25 DIAGNOSIS — I10 Essential (primary) hypertension: Secondary | ICD-10-CM | POA: Diagnosis not present

## 2011-03-25 DIAGNOSIS — R279 Unspecified lack of coordination: Secondary | ICD-10-CM | POA: Diagnosis not present

## 2011-03-25 DIAGNOSIS — I5022 Chronic systolic (congestive) heart failure: Secondary | ICD-10-CM | POA: Diagnosis not present

## 2011-03-25 DIAGNOSIS — I2510 Atherosclerotic heart disease of native coronary artery without angina pectoris: Secondary | ICD-10-CM | POA: Diagnosis not present

## 2011-03-25 DIAGNOSIS — Z7901 Long term (current) use of anticoagulants: Secondary | ICD-10-CM | POA: Diagnosis not present

## 2011-03-25 DIAGNOSIS — R269 Unspecified abnormalities of gait and mobility: Secondary | ICD-10-CM | POA: Diagnosis not present

## 2011-03-28 DIAGNOSIS — R279 Unspecified lack of coordination: Secondary | ICD-10-CM | POA: Diagnosis not present

## 2011-03-28 DIAGNOSIS — R269 Unspecified abnormalities of gait and mobility: Secondary | ICD-10-CM | POA: Diagnosis not present

## 2011-03-28 DIAGNOSIS — J13 Pneumonia due to Streptococcus pneumoniae: Secondary | ICD-10-CM | POA: Diagnosis not present

## 2011-03-28 DIAGNOSIS — Z9181 History of falling: Secondary | ICD-10-CM | POA: Diagnosis not present

## 2011-03-28 DIAGNOSIS — M6281 Muscle weakness (generalized): Secondary | ICD-10-CM | POA: Diagnosis not present

## 2011-03-30 DIAGNOSIS — M6281 Muscle weakness (generalized): Secondary | ICD-10-CM | POA: Diagnosis not present

## 2011-03-30 DIAGNOSIS — Z9181 History of falling: Secondary | ICD-10-CM | POA: Diagnosis not present

## 2011-03-30 DIAGNOSIS — R279 Unspecified lack of coordination: Secondary | ICD-10-CM | POA: Diagnosis not present

## 2011-03-30 DIAGNOSIS — J13 Pneumonia due to Streptococcus pneumoniae: Secondary | ICD-10-CM | POA: Diagnosis not present

## 2011-03-30 DIAGNOSIS — R269 Unspecified abnormalities of gait and mobility: Secondary | ICD-10-CM | POA: Diagnosis not present

## 2011-03-31 DIAGNOSIS — Z961 Presence of intraocular lens: Secondary | ICD-10-CM | POA: Diagnosis not present

## 2011-04-01 DIAGNOSIS — R269 Unspecified abnormalities of gait and mobility: Secondary | ICD-10-CM | POA: Diagnosis not present

## 2011-04-01 DIAGNOSIS — R279 Unspecified lack of coordination: Secondary | ICD-10-CM | POA: Diagnosis not present

## 2011-04-01 DIAGNOSIS — M6281 Muscle weakness (generalized): Secondary | ICD-10-CM | POA: Diagnosis not present

## 2011-04-01 DIAGNOSIS — Z9181 History of falling: Secondary | ICD-10-CM | POA: Diagnosis not present

## 2011-04-01 DIAGNOSIS — J13 Pneumonia due to Streptococcus pneumoniae: Secondary | ICD-10-CM | POA: Diagnosis not present

## 2011-04-04 DIAGNOSIS — Z9181 History of falling: Secondary | ICD-10-CM | POA: Diagnosis not present

## 2011-04-04 DIAGNOSIS — I701 Atherosclerosis of renal artery: Secondary | ICD-10-CM | POA: Diagnosis not present

## 2011-04-04 DIAGNOSIS — R269 Unspecified abnormalities of gait and mobility: Secondary | ICD-10-CM | POA: Diagnosis not present

## 2011-04-04 DIAGNOSIS — M6281 Muscle weakness (generalized): Secondary | ICD-10-CM | POA: Diagnosis not present

## 2011-04-04 DIAGNOSIS — H11429 Conjunctival edema, unspecified eye: Secondary | ICD-10-CM | POA: Diagnosis not present

## 2011-04-04 DIAGNOSIS — J13 Pneumonia due to Streptococcus pneumoniae: Secondary | ICD-10-CM | POA: Diagnosis not present

## 2011-04-04 DIAGNOSIS — I10 Essential (primary) hypertension: Secondary | ICD-10-CM | POA: Diagnosis not present

## 2011-04-04 DIAGNOSIS — R279 Unspecified lack of coordination: Secondary | ICD-10-CM | POA: Diagnosis not present

## 2011-04-06 DIAGNOSIS — R269 Unspecified abnormalities of gait and mobility: Secondary | ICD-10-CM | POA: Diagnosis not present

## 2011-04-06 DIAGNOSIS — Z9181 History of falling: Secondary | ICD-10-CM | POA: Diagnosis not present

## 2011-04-06 DIAGNOSIS — M6281 Muscle weakness (generalized): Secondary | ICD-10-CM | POA: Diagnosis not present

## 2011-04-06 DIAGNOSIS — R279 Unspecified lack of coordination: Secondary | ICD-10-CM | POA: Diagnosis not present

## 2011-04-06 DIAGNOSIS — J13 Pneumonia due to Streptococcus pneumoniae: Secondary | ICD-10-CM | POA: Diagnosis not present

## 2011-04-19 DIAGNOSIS — E119 Type 2 diabetes mellitus without complications: Secondary | ICD-10-CM | POA: Diagnosis not present

## 2011-04-19 DIAGNOSIS — I10 Essential (primary) hypertension: Secondary | ICD-10-CM | POA: Diagnosis not present

## 2011-04-19 DIAGNOSIS — I4891 Unspecified atrial fibrillation: Secondary | ICD-10-CM | POA: Diagnosis not present

## 2011-04-19 DIAGNOSIS — E039 Hypothyroidism, unspecified: Secondary | ICD-10-CM | POA: Diagnosis not present

## 2011-04-19 DIAGNOSIS — Z7901 Long term (current) use of anticoagulants: Secondary | ICD-10-CM | POA: Diagnosis not present

## 2011-04-22 DIAGNOSIS — I4891 Unspecified atrial fibrillation: Secondary | ICD-10-CM | POA: Diagnosis not present

## 2011-04-22 DIAGNOSIS — Z7901 Long term (current) use of anticoagulants: Secondary | ICD-10-CM | POA: Diagnosis not present

## 2011-04-28 DIAGNOSIS — I509 Heart failure, unspecified: Secondary | ICD-10-CM | POA: Diagnosis not present

## 2011-04-28 DIAGNOSIS — R040 Epistaxis: Secondary | ICD-10-CM | POA: Diagnosis not present

## 2011-04-28 DIAGNOSIS — R350 Frequency of micturition: Secondary | ICD-10-CM | POA: Diagnosis not present

## 2011-04-28 DIAGNOSIS — Z7901 Long term (current) use of anticoagulants: Secondary | ICD-10-CM | POA: Diagnosis not present

## 2011-04-28 DIAGNOSIS — J189 Pneumonia, unspecified organism: Secondary | ICD-10-CM | POA: Diagnosis not present

## 2011-05-11 ENCOUNTER — Encounter (INDEPENDENT_AMBULATORY_CARE_PROVIDER_SITE_OTHER): Payer: Medicare Other | Admitting: Ophthalmology

## 2011-05-11 DIAGNOSIS — H35039 Hypertensive retinopathy, unspecified eye: Secondary | ICD-10-CM

## 2011-05-11 DIAGNOSIS — H43819 Vitreous degeneration, unspecified eye: Secondary | ICD-10-CM

## 2011-05-11 DIAGNOSIS — H33009 Unspecified retinal detachment with retinal break, unspecified eye: Secondary | ICD-10-CM

## 2011-05-19 DIAGNOSIS — Z7901 Long term (current) use of anticoagulants: Secondary | ICD-10-CM | POA: Diagnosis not present

## 2011-05-19 DIAGNOSIS — I4891 Unspecified atrial fibrillation: Secondary | ICD-10-CM | POA: Diagnosis not present

## 2011-06-16 DIAGNOSIS — I4891 Unspecified atrial fibrillation: Secondary | ICD-10-CM | POA: Diagnosis not present

## 2011-06-16 DIAGNOSIS — I10 Essential (primary) hypertension: Secondary | ICD-10-CM | POA: Diagnosis not present

## 2011-06-16 DIAGNOSIS — I2510 Atherosclerotic heart disease of native coronary artery without angina pectoris: Secondary | ICD-10-CM | POA: Diagnosis not present

## 2011-07-07 DIAGNOSIS — Z85828 Personal history of other malignant neoplasm of skin: Secondary | ICD-10-CM | POA: Diagnosis not present

## 2011-07-07 DIAGNOSIS — L570 Actinic keratosis: Secondary | ICD-10-CM | POA: Diagnosis not present

## 2011-07-07 DIAGNOSIS — L821 Other seborrheic keratosis: Secondary | ICD-10-CM | POA: Diagnosis not present

## 2011-07-21 DIAGNOSIS — Z7901 Long term (current) use of anticoagulants: Secondary | ICD-10-CM | POA: Diagnosis not present

## 2011-07-21 DIAGNOSIS — I4891 Unspecified atrial fibrillation: Secondary | ICD-10-CM | POA: Diagnosis not present

## 2011-07-26 DIAGNOSIS — E785 Hyperlipidemia, unspecified: Secondary | ICD-10-CM | POA: Diagnosis not present

## 2011-07-26 DIAGNOSIS — E119 Type 2 diabetes mellitus without complications: Secondary | ICD-10-CM | POA: Diagnosis not present

## 2011-07-26 DIAGNOSIS — E039 Hypothyroidism, unspecified: Secondary | ICD-10-CM | POA: Diagnosis not present

## 2011-08-04 DIAGNOSIS — I10 Essential (primary) hypertension: Secondary | ICD-10-CM | POA: Diagnosis not present

## 2011-08-04 DIAGNOSIS — M25569 Pain in unspecified knee: Secondary | ICD-10-CM

## 2011-08-04 DIAGNOSIS — I4891 Unspecified atrial fibrillation: Secondary | ICD-10-CM | POA: Diagnosis not present

## 2011-08-04 DIAGNOSIS — E785 Hyperlipidemia, unspecified: Secondary | ICD-10-CM | POA: Diagnosis not present

## 2011-08-04 DIAGNOSIS — E039 Hypothyroidism, unspecified: Secondary | ICD-10-CM | POA: Diagnosis not present

## 2011-08-04 HISTORY — DX: Pain in unspecified knee: M25.569

## 2011-08-16 DIAGNOSIS — I4891 Unspecified atrial fibrillation: Secondary | ICD-10-CM | POA: Diagnosis not present

## 2011-08-16 DIAGNOSIS — Z7901 Long term (current) use of anticoagulants: Secondary | ICD-10-CM | POA: Diagnosis not present

## 2011-08-23 DIAGNOSIS — I4891 Unspecified atrial fibrillation: Secondary | ICD-10-CM | POA: Diagnosis not present

## 2011-08-23 DIAGNOSIS — M1710 Unilateral primary osteoarthritis, unspecified knee: Secondary | ICD-10-CM | POA: Diagnosis not present

## 2011-09-13 DIAGNOSIS — Z7901 Long term (current) use of anticoagulants: Secondary | ICD-10-CM | POA: Diagnosis not present

## 2011-09-13 DIAGNOSIS — I4891 Unspecified atrial fibrillation: Secondary | ICD-10-CM | POA: Diagnosis not present

## 2011-10-05 DIAGNOSIS — I4891 Unspecified atrial fibrillation: Secondary | ICD-10-CM | POA: Diagnosis not present

## 2011-10-05 DIAGNOSIS — I509 Heart failure, unspecified: Secondary | ICD-10-CM | POA: Diagnosis not present

## 2011-10-05 DIAGNOSIS — I10 Essential (primary) hypertension: Secondary | ICD-10-CM | POA: Diagnosis not present

## 2011-10-05 DIAGNOSIS — I2510 Atherosclerotic heart disease of native coronary artery without angina pectoris: Secondary | ICD-10-CM | POA: Diagnosis not present

## 2011-10-07 DIAGNOSIS — M1710 Unilateral primary osteoarthritis, unspecified knee: Secondary | ICD-10-CM | POA: Diagnosis not present

## 2011-10-11 DIAGNOSIS — I4891 Unspecified atrial fibrillation: Secondary | ICD-10-CM | POA: Diagnosis not present

## 2011-10-11 DIAGNOSIS — Z7901 Long term (current) use of anticoagulants: Secondary | ICD-10-CM | POA: Diagnosis not present

## 2011-10-14 DIAGNOSIS — IMO0002 Reserved for concepts with insufficient information to code with codable children: Secondary | ICD-10-CM | POA: Diagnosis not present

## 2011-10-14 DIAGNOSIS — M1710 Unilateral primary osteoarthritis, unspecified knee: Secondary | ICD-10-CM | POA: Diagnosis not present

## 2011-10-20 DIAGNOSIS — M1710 Unilateral primary osteoarthritis, unspecified knee: Secondary | ICD-10-CM | POA: Diagnosis not present

## 2011-10-27 DIAGNOSIS — I714 Abdominal aortic aneurysm, without rupture: Secondary | ICD-10-CM | POA: Diagnosis not present

## 2011-10-27 DIAGNOSIS — I719 Aortic aneurysm of unspecified site, without rupture: Secondary | ICD-10-CM

## 2011-10-27 DIAGNOSIS — R351 Nocturia: Secondary | ICD-10-CM | POA: Insufficient documentation

## 2011-10-27 DIAGNOSIS — I10 Essential (primary) hypertension: Secondary | ICD-10-CM | POA: Diagnosis not present

## 2011-10-27 DIAGNOSIS — M2550 Pain in unspecified joint: Secondary | ICD-10-CM | POA: Diagnosis not present

## 2011-10-27 DIAGNOSIS — E785 Hyperlipidemia, unspecified: Secondary | ICD-10-CM | POA: Diagnosis not present

## 2011-10-27 DIAGNOSIS — I4891 Unspecified atrial fibrillation: Secondary | ICD-10-CM | POA: Diagnosis not present

## 2011-10-27 HISTORY — DX: Aortic aneurysm of unspecified site, without rupture: I71.9

## 2011-10-27 HISTORY — DX: Nocturia: R35.1

## 2011-11-01 DIAGNOSIS — I10 Essential (primary) hypertension: Secondary | ICD-10-CM | POA: Diagnosis not present

## 2011-11-01 DIAGNOSIS — E785 Hyperlipidemia, unspecified: Secondary | ICD-10-CM | POA: Diagnosis not present

## 2011-11-01 DIAGNOSIS — E039 Hypothyroidism, unspecified: Secondary | ICD-10-CM | POA: Diagnosis not present

## 2011-11-09 DIAGNOSIS — I4891 Unspecified atrial fibrillation: Secondary | ICD-10-CM | POA: Diagnosis not present

## 2011-11-09 DIAGNOSIS — Z7901 Long term (current) use of anticoagulants: Secondary | ICD-10-CM | POA: Diagnosis not present

## 2011-12-01 DIAGNOSIS — Z7901 Long term (current) use of anticoagulants: Secondary | ICD-10-CM | POA: Diagnosis not present

## 2011-12-01 DIAGNOSIS — I4891 Unspecified atrial fibrillation: Secondary | ICD-10-CM | POA: Diagnosis not present

## 2011-12-06 DIAGNOSIS — M1710 Unilateral primary osteoarthritis, unspecified knee: Secondary | ICD-10-CM | POA: Diagnosis not present

## 2011-12-15 DIAGNOSIS — G4700 Insomnia, unspecified: Secondary | ICD-10-CM | POA: Diagnosis not present

## 2011-12-15 DIAGNOSIS — I10 Essential (primary) hypertension: Secondary | ICD-10-CM | POA: Diagnosis not present

## 2011-12-15 DIAGNOSIS — M818 Other osteoporosis without current pathological fracture: Secondary | ICD-10-CM | POA: Diagnosis not present

## 2011-12-22 DIAGNOSIS — I10 Essential (primary) hypertension: Secondary | ICD-10-CM | POA: Diagnosis not present

## 2011-12-22 DIAGNOSIS — F329 Major depressive disorder, single episode, unspecified: Secondary | ICD-10-CM | POA: Diagnosis not present

## 2011-12-22 DIAGNOSIS — S01502A Unspecified open wound of oral cavity, initial encounter: Secondary | ICD-10-CM | POA: Insufficient documentation

## 2011-12-29 DIAGNOSIS — I4891 Unspecified atrial fibrillation: Secondary | ICD-10-CM | POA: Diagnosis not present

## 2011-12-29 DIAGNOSIS — Z7901 Long term (current) use of anticoagulants: Secondary | ICD-10-CM | POA: Diagnosis not present

## 2012-01-10 DIAGNOSIS — E119 Type 2 diabetes mellitus without complications: Secondary | ICD-10-CM | POA: Diagnosis not present

## 2012-01-10 DIAGNOSIS — E039 Hypothyroidism, unspecified: Secondary | ICD-10-CM | POA: Diagnosis not present

## 2012-01-10 DIAGNOSIS — E785 Hyperlipidemia, unspecified: Secondary | ICD-10-CM | POA: Diagnosis not present

## 2012-01-10 DIAGNOSIS — I10 Essential (primary) hypertension: Secondary | ICD-10-CM | POA: Diagnosis not present

## 2012-01-16 DIAGNOSIS — Z23 Encounter for immunization: Secondary | ICD-10-CM | POA: Diagnosis not present

## 2012-01-19 DIAGNOSIS — E119 Type 2 diabetes mellitus without complications: Secondary | ICD-10-CM | POA: Diagnosis not present

## 2012-01-19 DIAGNOSIS — I10 Essential (primary) hypertension: Secondary | ICD-10-CM | POA: Diagnosis not present

## 2012-01-19 DIAGNOSIS — F329 Major depressive disorder, single episode, unspecified: Secondary | ICD-10-CM | POA: Diagnosis not present

## 2012-01-19 DIAGNOSIS — E039 Hypothyroidism, unspecified: Secondary | ICD-10-CM | POA: Diagnosis not present

## 2012-01-23 DIAGNOSIS — Z7901 Long term (current) use of anticoagulants: Secondary | ICD-10-CM | POA: Diagnosis not present

## 2012-01-23 DIAGNOSIS — I4891 Unspecified atrial fibrillation: Secondary | ICD-10-CM | POA: Diagnosis not present

## 2012-02-10 ENCOUNTER — Ambulatory Visit (INDEPENDENT_AMBULATORY_CARE_PROVIDER_SITE_OTHER): Payer: Medicare Other | Admitting: Ophthalmology

## 2012-02-10 DIAGNOSIS — H43819 Vitreous degeneration, unspecified eye: Secondary | ICD-10-CM

## 2012-02-10 DIAGNOSIS — I10 Essential (primary) hypertension: Secondary | ICD-10-CM

## 2012-02-10 DIAGNOSIS — H35039 Hypertensive retinopathy, unspecified eye: Secondary | ICD-10-CM

## 2012-02-10 DIAGNOSIS — H33009 Unspecified retinal detachment with retinal break, unspecified eye: Secondary | ICD-10-CM

## 2012-02-17 ENCOUNTER — Ambulatory Visit (INDEPENDENT_AMBULATORY_CARE_PROVIDER_SITE_OTHER): Payer: Medicare Other | Admitting: Family Medicine

## 2012-02-17 ENCOUNTER — Ambulatory Visit: Payer: Medicare Other

## 2012-02-17 VITALS — BP 151/77 | HR 71 | Temp 97.7°F | Resp 16 | Ht 68.5 in | Wt 162.0 lb

## 2012-02-17 DIAGNOSIS — M25511 Pain in right shoulder: Secondary | ICD-10-CM

## 2012-02-17 DIAGNOSIS — M7500 Adhesive capsulitis of unspecified shoulder: Secondary | ICD-10-CM

## 2012-02-17 DIAGNOSIS — M25519 Pain in unspecified shoulder: Secondary | ICD-10-CM | POA: Diagnosis not present

## 2012-02-17 DIAGNOSIS — I2510 Atherosclerotic heart disease of native coronary artery without angina pectoris: Secondary | ICD-10-CM

## 2012-02-17 DIAGNOSIS — M19019 Primary osteoarthritis, unspecified shoulder: Secondary | ICD-10-CM | POA: Diagnosis not present

## 2012-02-17 NOTE — Patient Instructions (Signed)
Tylenol as discussed  If pain persists return and I can inject it we continued to an orthopedist if you prefer.

## 2012-02-17 NOTE — Progress Notes (Signed)
Subjective: Patient lives with his son at the office today. He was leaning on his cane some, and developed right shoulder pain. It stayed pretty intense. He has a history of coronary artery disease and a CABG. He took his nitroglycerin on several times since he has did not have any nausea or diaphoresis. The pain was just deep-seated in the right shoulder. He has a history of right shoulder decreased range of motion of the shoulder. 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  Objective: Pleasant alert elderly gentleman in some disc stress, with his shoulder hurting him. Neck is supple. He has almost frozen shoulder on the right. Cannot abduct it. He lifts his scapula 20 raises arm. He has some anterior movement. He has seen an orthopedist in the past. Chest is clear. Heart regular without murmurs. EKG is in a sinus rhythm with first degree AV block and a bundle branch block.  Assessment: Right shoulder pain Frozen shoulder right with adhesive capsulitis probably History of coronary disease  Plan:  [facility] reading (PRIMARY) by  Dr. Alwyn Ren Mild arthritic changes  Arthritis adhesive capsulitis right shoulder with shoulder pain Offered to inject it, but he elected just to take Tylenol as he is due for a little while longer him in the office and help him.  We'll see him back when necessary.Marland Kitchen

## 2012-02-18 ENCOUNTER — Encounter: Payer: Self-pay | Admitting: Family Medicine

## 2012-02-21 DIAGNOSIS — Z7901 Long term (current) use of anticoagulants: Secondary | ICD-10-CM | POA: Diagnosis not present

## 2012-02-21 DIAGNOSIS — I4891 Unspecified atrial fibrillation: Secondary | ICD-10-CM | POA: Diagnosis not present

## 2012-03-06 ENCOUNTER — Encounter: Payer: Self-pay | Admitting: Internal Medicine

## 2012-03-08 ENCOUNTER — Encounter: Payer: Self-pay | Admitting: Internal Medicine

## 2012-03-08 DIAGNOSIS — H903 Sensorineural hearing loss, bilateral: Secondary | ICD-10-CM | POA: Diagnosis not present

## 2012-03-20 ENCOUNTER — Encounter: Payer: Self-pay | Admitting: Internal Medicine

## 2012-03-20 DIAGNOSIS — I4891 Unspecified atrial fibrillation: Secondary | ICD-10-CM | POA: Diagnosis not present

## 2012-03-20 DIAGNOSIS — Z7901 Long term (current) use of anticoagulants: Secondary | ICD-10-CM | POA: Diagnosis not present

## 2012-03-23 ENCOUNTER — Encounter: Payer: Self-pay | Admitting: Geriatric Medicine

## 2012-03-28 ENCOUNTER — Other Ambulatory Visit: Payer: Self-pay | Admitting: Dermatology

## 2012-03-28 DIAGNOSIS — D485 Neoplasm of uncertain behavior of skin: Secondary | ICD-10-CM | POA: Diagnosis not present

## 2012-03-28 DIAGNOSIS — L570 Actinic keratosis: Secondary | ICD-10-CM | POA: Diagnosis not present

## 2012-03-28 DIAGNOSIS — D0460 Carcinoma in situ of skin of unspecified upper limb, including shoulder: Secondary | ICD-10-CM | POA: Diagnosis not present

## 2012-04-02 DIAGNOSIS — Z961 Presence of intraocular lens: Secondary | ICD-10-CM | POA: Diagnosis not present

## 2012-04-17 DIAGNOSIS — I4891 Unspecified atrial fibrillation: Secondary | ICD-10-CM | POA: Diagnosis not present

## 2012-04-17 DIAGNOSIS — Z7901 Long term (current) use of anticoagulants: Secondary | ICD-10-CM | POA: Diagnosis not present

## 2012-04-26 ENCOUNTER — Ambulatory Visit (HOSPITAL_COMMUNITY)
Admission: RE | Admit: 2012-04-26 | Discharge: 2012-04-26 | Disposition: A | Discharge status: Home / Self Care | Payer: Medicare Other | Source: Ambulatory Visit | Attending: Cardiovascular Disease | Admitting: Cardiovascular Disease

## 2012-04-26 DIAGNOSIS — I82409 Acute embolism and thrombosis of unspecified deep veins of unspecified lower extremity: Secondary | ICD-10-CM | POA: Diagnosis not present

## 2012-04-26 DIAGNOSIS — I4891 Unspecified atrial fibrillation: Secondary | ICD-10-CM | POA: Diagnosis not present

## 2012-04-26 DIAGNOSIS — Z951 Presence of aortocoronary bypass graft: Secondary | ICD-10-CM | POA: Diagnosis not present

## 2012-04-26 DIAGNOSIS — M7989 Other specified soft tissue disorders: Secondary | ICD-10-CM | POA: Insufficient documentation

## 2012-04-26 NOTE — Progress Notes (Signed)
Left Lower Ext. Venous Duplex Completed. Negative for DVT. Chauncy Lean

## 2012-04-27 ENCOUNTER — Other Ambulatory Visit (HOSPITAL_COMMUNITY): Payer: Self-pay | Admitting: Cardiology

## 2012-04-27 DIAGNOSIS — I82409 Acute embolism and thrombosis of unspecified deep veins of unspecified lower extremity: Secondary | ICD-10-CM

## 2012-04-27 DIAGNOSIS — M7989 Other specified soft tissue disorders: Secondary | ICD-10-CM

## 2012-05-02 ENCOUNTER — Other Ambulatory Visit (HOSPITAL_COMMUNITY): Payer: Self-pay | Admitting: Cardiology

## 2012-05-03 DIAGNOSIS — I10 Essential (primary) hypertension: Secondary | ICD-10-CM | POA: Diagnosis not present

## 2012-05-09 ENCOUNTER — Ambulatory Visit (INDEPENDENT_AMBULATORY_CARE_PROVIDER_SITE_OTHER): Payer: Medicare Other | Admitting: Emergency Medicine

## 2012-05-09 ENCOUNTER — Ambulatory Visit: Payer: Medicare Other

## 2012-05-09 VITALS — BP 148/80 | HR 77 | Temp 97.7°F | Resp 16 | Ht 69.0 in | Wt 166.2 lb

## 2012-05-09 DIAGNOSIS — R05 Cough: Secondary | ICD-10-CM

## 2012-05-09 MED ORDER — CEFDINIR 300 MG PO CAPS: 300.0000 mg | ORAL_CAPSULE | Freq: Two times a day (BID) | ORAL | Status: DC

## 2012-05-09 NOTE — Progress Notes (Signed)
  Subjective:    Patient ID: Jon [name], male    DOB: [birth year], 79 y.o.   MRN: [id number]  HPI Sinus drainage x 1-2 weeks, progressed to  congestion in chest, denies core throat, fever.  Blowing clear sputum.dec/jan of lat year had pneumonia.    Review of Systems patient has Jon history of cardiac disease atrial fibrillation currently on Coumadin . He is followed at [facility].     Objective:   Physical Exam HEENT exam TMs have bilateral hearing aids nose has Jon clear rhinorrhea. The posterior pharynx is clear. Chest exam is clear to both auscultation and percussion anteriorly but there are Jon few rhonchi basilar bilaterally.  [facility] reading (PRIMARY) by  Dr.Daub is Jon pacemaker in place. There is mild cardiomegaly. No pneumonic infiltrates are seen        Assessment & Plan:  Because of Jon history of community-acquired pneumonia we'll check Jon chest x-ray today. I do not see definite pneumonia. We'll treat with Mucinex and Omnicef because I feel this would have the least effect on his Coumadin. He is to notify the Coumadin clinic we put him on this medication the

## 2012-05-15 DIAGNOSIS — E039 Hypothyroidism, unspecified: Secondary | ICD-10-CM | POA: Diagnosis not present

## 2012-05-15 DIAGNOSIS — E119 Type 2 diabetes mellitus without complications: Secondary | ICD-10-CM | POA: Diagnosis not present

## 2012-05-22 ENCOUNTER — Ambulatory Visit (HOSPITAL_COMMUNITY)
Admission: RE | Admit: 2012-05-22 | Discharge: 2012-05-22 | Disposition: A | Discharge status: Home / Self Care | Payer: Medicare Other | Source: Ambulatory Visit | Attending: Internal Medicine | Admitting: Internal Medicine

## 2012-05-22 DIAGNOSIS — I70219 Atherosclerosis of native arteries of extremities with intermittent claudication, unspecified extremity: Secondary | ICD-10-CM | POA: Insufficient documentation

## 2012-05-22 DIAGNOSIS — Z9289 Personal history of other medical treatment: Secondary | ICD-10-CM

## 2012-05-22 HISTORY — DX: Personal history of other medical treatment: Z92.89

## 2012-05-22 NOTE — Progress Notes (Signed)
Lower Ext Arterial Duplex Completed. Chauncy Lean

## 2012-05-22 NOTE — Progress Notes (Signed)
Aorta Duplex Completed. Jon [name] D  

## 2012-05-29 DIAGNOSIS — I739 Peripheral vascular disease, unspecified: Secondary | ICD-10-CM | POA: Diagnosis not present

## 2012-05-29 DIAGNOSIS — I719 Aortic aneurysm of unspecified site, without rupture: Secondary | ICD-10-CM | POA: Diagnosis not present

## 2012-05-29 DIAGNOSIS — Z7901 Long term (current) use of anticoagulants: Secondary | ICD-10-CM | POA: Diagnosis not present

## 2012-05-29 DIAGNOSIS — I4891 Unspecified atrial fibrillation: Secondary | ICD-10-CM | POA: Diagnosis not present

## 2012-05-31 ENCOUNTER — Non-Acute Institutional Stay: Payer: Medicare Other | Admitting: Nurse Practitioner

## 2012-05-31 ENCOUNTER — Encounter: Payer: Self-pay | Admitting: Nurse Practitioner

## 2012-05-31 VITALS — BP 144/78 | HR 64 | Ht 68.0 in | Wt 162.0 lb

## 2012-05-31 DIAGNOSIS — I10 Essential (primary) hypertension: Secondary | ICD-10-CM | POA: Diagnosis not present

## 2012-05-31 DIAGNOSIS — E039 Hypothyroidism, unspecified: Secondary | ICD-10-CM

## 2012-05-31 DIAGNOSIS — E871 Hypo-osmolality and hyponatremia: Secondary | ICD-10-CM

## 2012-05-31 DIAGNOSIS — I4891 Unspecified atrial fibrillation: Secondary | ICD-10-CM

## 2012-05-31 MED ORDER — LEVOTHYROXINE SODIUM 100 MCG PO TABS: 100.0000 ug | ORAL_TABLET | Freq: Every day | ORAL | Status: DC

## 2012-05-31 NOTE — Progress Notes (Signed)
  Subjective:    Patient ID: Jon [name], male    DOB: 1921-09-29, 79 y.o.   MRN: [id number]  HPI    Hyponatremia: serum Na 130 05/15/12  TSH 6.156 05/15/12  244.9-HYPOTHYROIDISM  takes Synthroid 401.9-HTN UNSPECIFIED  controlled on Metoprolol 75mg  bid and HCTZ 25mg  daily.   Review of Systems  Constitutional: Negative.   HENT: Negative for hearing loss, ear pain, congestion, rhinorrhea, neck pain, neck stiffness, sinus pressure, tinnitus and ear discharge.   Eyes: Negative.   Respiratory: Positive for cough (recently has been treated for acute bronchitis with ABT for 10 days, still has some hacking ocugh  occationslly ). Negative for choking, chest tightness, shortness of breath and wheezing.   Cardiovascular: Negative for palpitations. Leg swelling: sometimes, not apparent today.   Gastrointestinal: Negative.   Endocrine: Negative.        Has been treated for hypothyroidism with Levothyroxine  Musculoskeletal: Positive for arthralgias and gait problem.  Skin: Negative.   Allergic/Immunologic: Negative.   Neurological: Negative.   Hematological: Negative.   Psychiatric/Behavioral: Negative.        Objective:   Physical Exam  Constitutional: He is oriented to person, place, and time.  Cardiovascular: Normal rate, regular rhythm and normal heart sounds.   No murmur heard. Pulmonary/Chest: Effort normal and breath sounds normal. He has no wheezes. He has no rales.  Abdominal: Soft. Bowel sounds are normal.  Musculoskeletal: Normal range of motion. He exhibits no edema and no tenderness.  Neurological: He is alert and oriented to person, place, and time. He displays normal reflexes. No cranial nerve deficit. He exhibits normal muscle tone. Coordination normal.  Skin: Skin is warm and dry.  Psychiatric: He has Jon normal mood and affect. His behavior is normal. Judgment and thought content normal.          Assessment & Plan:   Marland Kitchen Hyponatremia Encourage moderation use  of table salt, none presently, HCTZ also contributed to the problem, will f/u BMP  Hyperlipidemia: the patient has stopped taking cholesterol medication on his own. He desires to check lipid panel to evaluate.   . Atrial fibrillation Rate controlled.  Marland Kitchen HTN (hypertension) Controlled.   . Hypothyroidism Elevated TSH 6.156, increase Levothyroxine to , f/u TSH in 12weeks.   . Long term (current) use of anticoagulation At Cardiology, last checked was therapeutic 2s.  Cardiac pacemaker in situ  . Unspecified venous (peripheral) insufficiency Swelling is not apparent today. Recently has Doppler done at Cardiology and he said it was [facility].   . Osteoarthrosis, unspecified whether generalized or localized, lower leg   . Atherosclerosis of renal artery Blood pressure is consider controlled, 144/78  . Congestive heart failure, unspecified Compensated, sometimes lower leg edema, takes HCTZ  . Urinary frequency No change  . Type II or unspecified type diabetes mellitus without mention of complication, not stated as uncontrolled Hgb A1c 6.1 05/15/12

## 2012-06-07 DIAGNOSIS — E785 Hyperlipidemia, unspecified: Secondary | ICD-10-CM | POA: Diagnosis not present

## 2012-06-07 DIAGNOSIS — I10 Essential (primary) hypertension: Secondary | ICD-10-CM | POA: Diagnosis not present

## 2012-06-08 ENCOUNTER — Encounter: Payer: Self-pay | Admitting: Pharmacist Clinician (PhC)/ Clinical Pharmacy Specialist

## 2012-06-08 DIAGNOSIS — Z7901 Long term (current) use of anticoagulants: Secondary | ICD-10-CM

## 2012-06-08 DIAGNOSIS — I4891 Unspecified atrial fibrillation: Secondary | ICD-10-CM

## 2012-06-26 DIAGNOSIS — Z7901 Long term (current) use of anticoagulants: Secondary | ICD-10-CM | POA: Diagnosis not present

## 2012-06-26 DIAGNOSIS — I4891 Unspecified atrial fibrillation: Secondary | ICD-10-CM | POA: Diagnosis not present

## 2012-07-04 ENCOUNTER — Encounter: Payer: Self-pay | Admitting: *Deleted

## 2012-07-04 ENCOUNTER — Encounter: Payer: Self-pay | Admitting: Internal Medicine

## 2012-07-09 DIAGNOSIS — L820 Inflamed seborrheic keratosis: Secondary | ICD-10-CM | POA: Diagnosis not present

## 2012-07-09 DIAGNOSIS — Z85828 Personal history of other malignant neoplasm of skin: Secondary | ICD-10-CM | POA: Diagnosis not present

## 2012-07-09 DIAGNOSIS — L570 Actinic keratosis: Secondary | ICD-10-CM | POA: Diagnosis not present

## 2012-07-09 DIAGNOSIS — L738 Other specified follicular disorders: Secondary | ICD-10-CM | POA: Diagnosis not present

## 2012-07-09 DIAGNOSIS — L821 Other seborrheic keratosis: Secondary | ICD-10-CM | POA: Diagnosis not present

## 2012-07-17 ENCOUNTER — Other Ambulatory Visit: Payer: Self-pay | Admitting: Geriatric Medicine

## 2012-07-17 ENCOUNTER — Other Ambulatory Visit: Payer: Self-pay | Admitting: *Deleted

## 2012-07-17 MED ORDER — LORAZEPAM 0.5 MG PO TABS: ORAL_TABLET | ORAL | Status: DC

## 2012-07-17 NOTE — Telephone Encounter (Signed)
Notified patient need to contact PCP for refill on Lorazepam.

## 2012-07-25 ENCOUNTER — Ambulatory Visit: Payer: Medicare Other | Admitting: Pharmacist Clinician (PhC)/ Clinical Pharmacy Specialist

## 2012-07-26 ENCOUNTER — Ambulatory Visit (INDEPENDENT_AMBULATORY_CARE_PROVIDER_SITE_OTHER): Payer: Medicare Other | Admitting: Pharmacist Clinician (PhC)/ Clinical Pharmacy Specialist

## 2012-07-26 VITALS — BP 160/80 | HR 72

## 2012-07-26 DIAGNOSIS — I4891 Unspecified atrial fibrillation: Secondary | ICD-10-CM | POA: Diagnosis not present

## 2012-07-26 DIAGNOSIS — Z7901 Long term (current) use of anticoagulants: Secondary | ICD-10-CM | POA: Diagnosis not present

## 2012-08-15 ENCOUNTER — Other Ambulatory Visit: Payer: Self-pay | Admitting: *Deleted

## 2012-08-15 MED ORDER — PRAVASTATIN SODIUM 20 MG PO TABS: 20.0000 mg | ORAL_TABLET | Freq: Every evening | ORAL | Status: DC

## 2012-08-21 ENCOUNTER — Encounter: Payer: Self-pay | Admitting: Cardiovascular Disease

## 2012-08-22 ENCOUNTER — Ambulatory Visit (INDEPENDENT_AMBULATORY_CARE_PROVIDER_SITE_OTHER): Payer: Medicare Other | Admitting: Pharmacist Clinician (PhC)/ Clinical Pharmacy Specialist

## 2012-08-22 VITALS — BP 140/66 | HR 72

## 2012-08-22 DIAGNOSIS — Z7901 Long term (current) use of anticoagulants: Secondary | ICD-10-CM

## 2012-08-22 DIAGNOSIS — I4891 Unspecified atrial fibrillation: Secondary | ICD-10-CM

## 2012-08-23 ENCOUNTER — Ambulatory Visit: Payer: Medicare Other | Admitting: Pharmacist Clinician (PhC)/ Clinical Pharmacy Specialist

## 2012-08-23 DIAGNOSIS — E039 Hypothyroidism, unspecified: Secondary | ICD-10-CM | POA: Diagnosis not present

## 2012-09-03 ENCOUNTER — Other Ambulatory Visit: Payer: Self-pay | Admitting: *Deleted

## 2012-09-03 DIAGNOSIS — E039 Hypothyroidism, unspecified: Secondary | ICD-10-CM

## 2012-09-03 MED ORDER — LEVOTHYROXINE SODIUM 100 MCG PO TABS: ORAL_TABLET | ORAL | Status: DC

## 2012-09-11 ENCOUNTER — Other Ambulatory Visit: Payer: Self-pay | Admitting: *Deleted

## 2012-09-12 ENCOUNTER — Other Ambulatory Visit: Payer: Self-pay | Admitting: Pharmacist Clinician (PhC)/ Clinical Pharmacy Specialist

## 2012-09-12 MED ORDER — WARFARIN SODIUM 2.5 MG PO TABS: ORAL_TABLET | ORAL | Status: DC

## 2012-09-13 ENCOUNTER — Ambulatory Visit: Payer: Medicare Other | Admitting: Internal Medicine

## 2012-09-13 ENCOUNTER — Encounter: Payer: Self-pay | Admitting: Internal Medicine

## 2012-09-13 VITALS — BP 132/68 | HR 68 | Ht 68.0 in | Wt 163.0 lb

## 2012-09-13 DIAGNOSIS — E039 Hypothyroidism, unspecified: Secondary | ICD-10-CM

## 2012-09-13 DIAGNOSIS — M25569 Pain in unspecified knee: Secondary | ICD-10-CM

## 2012-09-13 DIAGNOSIS — E785 Hyperlipidemia, unspecified: Secondary | ICD-10-CM | POA: Diagnosis not present

## 2012-09-13 DIAGNOSIS — M25562 Pain in left knee: Secondary | ICD-10-CM

## 2012-09-13 DIAGNOSIS — I509 Heart failure, unspecified: Secondary | ICD-10-CM

## 2012-09-13 DIAGNOSIS — L299 Pruritus, unspecified: Secondary | ICD-10-CM | POA: Insufficient documentation

## 2012-09-13 DIAGNOSIS — I4891 Unspecified atrial fibrillation: Secondary | ICD-10-CM

## 2012-09-13 DIAGNOSIS — I10 Essential (primary) hypertension: Secondary | ICD-10-CM | POA: Diagnosis not present

## 2012-09-13 DIAGNOSIS — J309 Allergic rhinitis, unspecified: Secondary | ICD-10-CM

## 2012-09-13 DIAGNOSIS — E119 Type 2 diabetes mellitus without complications: Secondary | ICD-10-CM

## 2012-09-13 NOTE — Progress Notes (Signed)
Subjective:    Patient ID: Jon [name], male    DOB: [birth year], 79 y.o.   MRN: [id number]  HPI Atrial fibrillation: stable  HTN (hypertension) : controlled  Hypothyroidism: increased levothyroxine last visit.Marland Kitchen Recent TSH  Was improved and in the normal range.  Osteoarthrosis; left knee. Causes balance to be off. Has been told the knee is worn out. No swelling. Does not want surgery.  Other and unspecified hyperlipidemia: needs recheck  Type II or unspecified type diabetes mellitus without mention of complication, not stated as uncontrolled : controlled  Congestive heart failure, unspecified: controlled  Urinary frequency; unchanged.  Pruritus: mild and generalized. Likely due to dry skin.  Allergic rhinitis: frequently dripping nose.  Unspecified hearing loss: chronic, unchanged  Current Outpatient Prescriptions on File Prior to Visit  Medication Sig Dispense Refill  . allopurinol (ZYLOPRIM) 100 MG tablet Take 50 mg by mouth daily.        Marland Kitchen amiodarone (PACERONE) 200 MG tablet Take 200 mg by mouth daily.        Marland Kitchen amoxicillin (AMOXIL) 500 MG tablet Take 500 mg by mouth. Take four tablets one hour prior to dental procedures.      Marland Kitchen aspirin EC 81 MG tablet Take 81 mg by mouth daily.        . cetaphil (CETAPHIL) lotion Apply 1 application topically daily as needed. Compounded with triamcinolone cream 1% (1:3 ratio).  For rash       . Cholecalciferol (VITAMIN D) 400 UNITS capsule Take 400 Units by mouth daily. Take one capsule twice daily      . hydrochlorothiazide (HYDRODIURIL) 25 MG tablet Take 25 mg by mouth daily.      . irbesartan (AVAPRO) 300 MG tablet Take 300 mg by mouth at bedtime. Take one tablet daily for blood pressure      . levothyroxine (SYNTHROID, LEVOTHROID) 100 MCG tablet Take one tablet by mouth once daily for thryoid  90 tablet  3  . LORazepam (ATIVAN) 0.5 MG tablet Take one tablet twice Jon day as needed.  60 tablet  3  . metoprolol (LOPRESSOR) 50 MG tablet Take  100 mg by mouth 2 (two) times daily.       . nitroGLYCERIN (NITROSTAT) 0.4 MG SL tablet Place 0.4 mg under the tongue every 5 (five) minutes as needed. For chest pain       . OVER THE COUNTER MEDICATION Take 1 tablet by mouth daily as needed. qc allergy relief multi symptom.  For allergies       . potassium chloride (MICRO-K) 10 MEQ CR capsule Take 10 mEq by mouth. Take one capsule twice daily      . pravastatin (PRAVACHOL) 20 MG tablet Take 20 mg by mouth every evening. Patient states he's taking half of 40 mg      . triamcinolone cream (KENALOG) 0.1 % Apply 1 application topically daily as needed. Compounded with cetaphil lotion (3:1 ratio).  For rash       . warfarin (COUMADIN) 2.5 MG tablet Take 1 tablet daily or as directed  30 tablet  6  . zolpidem (AMBIEN) 10 MG tablet Take 5-10 mg by mouth at bedtime as needed. For sleep              Review of Systems  Constitutional: Negative.   HENT: Positive for hearing loss. Negative for ear pain, congestion, rhinorrhea, neck pain, neck stiffness, sinus pressure, tinnitus and ear discharge.   Eyes: Negative.   Respiratory: Negative for cough,  choking, chest tightness, shortness of breath and wheezing.   Cardiovascular: Negative for palpitations and leg swelling (sometimes, not apparent today. ).  Gastrointestinal: Negative.  Negative for abdominal pain, diarrhea and abdominal distention.  Endocrine:       Hypothyroid  Genitourinary: Positive for frequency.  Musculoskeletal: Positive for arthralgias and gait problem.  Skin: Negative.  Negative for pallor and rash.  Allergic/Immunologic: Negative.   Neurological: Negative.   Hematological: Negative.   Psychiatric/Behavioral: Negative.        Objective:   Physical Exam  Nursing note and vitals reviewed. Constitutional: He is oriented to person, place, and time. He appears well-developed and well-nourished. No distress.  HENT:  Severe hearing loss. Bilateral aides.  Eyes:  Corrective  lenses  Neck: No JVD present. No tracheal deviation present. No thyromegaly present.  Cardiovascular: Normal rate, regular rhythm and normal heart sounds.   No murmur heard. Pulmonary/Chest: Effort normal and breath sounds normal. He has no wheezes. He has no rales.  Abdominal: Soft. Bowel sounds are normal. He exhibits no distension and no mass. There is no tenderness.  Musculoskeletal: Normal range of motion. He exhibits no edema and no tenderness.  Unstable gait. Using 4 wheel walker.  Lymphadenopathy:    He has no cervical adenopathy.  Neurological: He is alert and oriented to person, place, and time. He displays normal reflexes. No cranial nerve deficit. He exhibits normal muscle tone. Coordination normal.  Skin: Skin is warm and dry. No rash noted. No erythema. No pallor.  Left great toenail is growing out and is loose at the dista end. Had prior trauma.  Psychiatric: He has Jon normal mood and affect. His behavior is normal. Judgment and thought content normal.   Lab reports 08/23/12 TSH 3.550      Assessment & Plan:   Atrial fibrillation: rate controlled  HTN (hypertension): stable/ controlled   - Plan: CMP  Hypothyroidism: improved   - Plan: TSH  Other and unspecified hyperlipidemia: needs recheck   - Plan: Lipid panel  Type II or unspecified type diabetes mellitus without mention of complication, not stated as uncontrolled: controlled   - Plan: Hemoglobin A1c  Congestive heart failure, unspecified; controlled  Pain in joint, lower leg, left: chronic  Pruritus: mild  Allergic rhinitis: discussed Atrovent nasal spray, but he does not want this at this time.

## 2012-09-13 NOTE — Patient Instructions (Signed)
Continue current medications. 

## 2012-09-19 ENCOUNTER — Ambulatory Visit (INDEPENDENT_AMBULATORY_CARE_PROVIDER_SITE_OTHER): Payer: Medicare Other | Admitting: Pharmacist Clinician (PhC)/ Clinical Pharmacy Specialist

## 2012-09-19 VITALS — BP 140/68 | HR 68

## 2012-09-19 DIAGNOSIS — Z7901 Long term (current) use of anticoagulants: Secondary | ICD-10-CM | POA: Diagnosis not present

## 2012-09-19 DIAGNOSIS — I4891 Unspecified atrial fibrillation: Secondary | ICD-10-CM | POA: Diagnosis not present

## 2012-09-19 LAB — POCT INR: INR: 2.3

## 2012-09-28 ENCOUNTER — Encounter: Payer: Self-pay | Admitting: *Deleted

## 2012-10-01 ENCOUNTER — Ambulatory Visit (INDEPENDENT_AMBULATORY_CARE_PROVIDER_SITE_OTHER): Payer: Medicare Other | Admitting: Internal Medicine

## 2012-10-01 ENCOUNTER — Encounter: Payer: Self-pay | Admitting: Internal Medicine

## 2012-10-01 ENCOUNTER — Other Ambulatory Visit: Payer: Self-pay | Admitting: Cardiovascular Disease

## 2012-10-01 VITALS — BP 130/88 | HR 63 | Ht 68.5 in | Wt 164.6 lb

## 2012-10-01 DIAGNOSIS — I2510 Atherosclerotic heart disease of native coronary artery without angina pectoris: Secondary | ICD-10-CM | POA: Diagnosis not present

## 2012-10-01 DIAGNOSIS — I10 Essential (primary) hypertension: Secondary | ICD-10-CM

## 2012-10-01 DIAGNOSIS — I4891 Unspecified atrial fibrillation: Secondary | ICD-10-CM | POA: Diagnosis not present

## 2012-10-01 DIAGNOSIS — Z950 Presence of cardiac pacemaker: Secondary | ICD-10-CM

## 2012-10-01 DIAGNOSIS — I509 Heart failure, unspecified: Secondary | ICD-10-CM | POA: Diagnosis not present

## 2012-10-01 DIAGNOSIS — Z79899 Other long term (current) drug therapy: Secondary | ICD-10-CM

## 2012-10-01 LAB — PACEMAKER DEVICE OBSERVATION
AL AMPLITUDE: 2.5 mv
AL THRESHOLD: 0.5 V
BAMS-0001: 171 {beats}/min
RV LEAD AMPLITUDE: 11.5 mv
RV LEAD IMPEDENCE PM: 472 Ohm
RV LEAD THRESHOLD: 1 V

## 2012-10-01 NOTE — Progress Notes (Signed)
OFFICE NOTE  Chief Complaint:  Routine followup  Primary Care Physician: Jon Relic, MD  HPI:  [name]  is an 79 yo male formerly followed by Dr. Clarene [name] and recently seen by Jon Boozer, NP, for left lower extremity edema with discoloration of his toes, was beginning to be uncomfortable for him to walk. We did venous Dopplers. He was quite concerned about arterial blood supply. His mother ended up with bilateral amputations. Venous Dopplers showed a ruptured baker cyst, no DVT. He was instructed on this and since that time his symptoms have resolved completely. He is on Coumadin for paroxysmal A-fib which led to the discoloration in his toes. Additionally, he has a history of abdominal aortic aneurysm and because he was concerned about his arterial disease, if he had it, we did Dopplers.  He is here for the results of the Dopplers. Bilateral ABIs were 1.0, though he does have appearance of an occluded right anterior tibial artery and a left posterior tib demonstrated a short segment of occlusive disease with reconstitution at the ankle. His pulses have been 2+. He has no claudication symptoms. Additionally, the duplex of his abdominal aorta was slightly, minimally changed from his last one. Previously it was 3.95 x 3.57, now he is 3.7 x 4.0, essentially the same, very slight increase. He has no abdominal pain and no other complaints. He has really no complaints today, feels quite well. He is not aware of any tachycardias or palpitations. He has a Medtronic pacemaker which was interrogated today. This demonstrates a battery voltage of 3.0V.  His a-fib burden is 1.6% and he is on amiodarone.  Other history includes bypass grafting in 1989; vein graft was stented in 2008. Last stress test was 2012, low risk study. EF was 49%. Pacemaker was placed for paroxysmal A-fib and bradycardia, sick sinus syndrome in 2011. He is also on warfarin.   PMHx:  Past Medical History  Diagnosis Date  .  Hypertension   . Coronary artery disease   . Arthritis   . Cataract   . Anxiety   . Blood transfusion without reported diagnosis   . Hyperlipidemia   . Heart murmur   . Allergy   . Thyroid disease   . PAF (paroxysmal atrial fibrillation)     coumadin  . History of abdominal aortic aneurysm   . History of echocardiogram 12/2008    EF >55%; mild concentric LVH; mild MR; mild-mod TR; mild AV regurg;   . History of nuclear stress test 12/2010    lexiscan; low risk; compared to prior study, perfusion improved  . History of Doppler ultrasound 05/22/2012    LEAs; R anterior tibial artery appeared occluded; L posterior tibial shows short segment of occlusive ds  . History of Doppler ultrasound 05/22/2012    Abdominal Aortic Doppler; slight increase in fusiform aneurysm     Past Surgical History  Procedure Laterality Date  . Coronary artery bypass graft  1989  . Pacemaker insertion  2011  . Insert / replace / remove pacemaker    . Eye surgery    . Joint replacement    . Hernia repair    . Coronary angioplasty with stent placement  2008    stent to SVG to OM    FAMHx:  Family History  Problem Relation Age of Onset  . Diabetes type II Father   . Other Mother     PAD with amputations    SOCHx:   reports that he quit smoking  about 45 years ago. His smoking use included Cigarettes. He smoked 0.00 packs per day for 15 years. He has never used smokeless tobacco. He reports that he does not drink alcohol or use illicit drugs.  ALLERGIES:  Allergies  Allergen Reactions  . Caduet (Amlodipine-Atorvastatin)     Weakness   . Crestor (Rosuvastatin)   . Cymbalta (Duloxetine Hcl) Other (See Comments)    Excessive sedation  . Lipitor (Atorvastatin Calcium)     Muscle pain  . Lisinopril   . Simvastatin     Muscle pain    ROS: A comprehensive review of systems was negative except for: Constitutional: positive for fatigue Respiratory: positive for dyspnea on exertion Cardiovascular:  positive for irregular heart beat  HOME MEDS: Current Outpatient Prescriptions  Medication Sig Dispense Refill  . allopurinol (ZYLOPRIM) 100 MG tablet Take 50 mg by mouth daily.        Marland Kitchen amiodarone (PACERONE) 200 MG tablet Take 200 mg by mouth daily.        Marland Kitchen amoxicillin (AMOXIL) 500 MG tablet Take 500 mg by mouth. Take four tablets one hour prior to dental procedures.      Marland Kitchen aspirin EC 81 MG tablet Take 81 mg by mouth daily.        . cetaphil (CETAPHIL) lotion Apply 1 application topically daily as needed. Compounded with triamcinolone cream 1% (1:3 ratio).  For rash       . Cholecalciferol (VITAMIN D) 400 UNITS capsule Take 400 Units by mouth daily. Take one capsule twice daily      . hydrochlorothiazide (HYDRODIURIL) 25 MG tablet Take 25 mg by mouth daily.      . irbesartan (AVAPRO) 300 MG tablet Take 300 mg by mouth at bedtime. Take one tablet daily for blood pressure      . levothyroxine (SYNTHROID, LEVOTHROID) 100 MCG tablet Take one tablet by mouth once daily for thryoid  90 tablet  3  . LORazepam (ATIVAN) 0.5 MG tablet Take one tablet twice a day as needed.  60 tablet  3  . metoprolol (LOPRESSOR) 50 MG tablet Take 100 mg by mouth 2 (two) times daily.       . Multiple Vitamins-Minerals (MULTIVITAMIN PO) Take by mouth daily.      . nitroGLYCERIN (NITROSTAT) 0.4 MG SL tablet Place 0.4 mg under the tongue every 5 (five) minutes as needed. For chest pain       . OVER THE COUNTER MEDICATION Take 1 tablet by mouth daily as needed. qc allergy relief multi symptom.  For allergies       . potassium chloride (MICRO-K) 10 MEQ CR capsule Take 10 mEq by mouth. Take one capsule twice daily      . pravastatin (PRAVACHOL) 20 MG tablet Take 20 mg by mouth every evening. Patient states he's taking half of 40 mg      . triamcinolone cream (KENALOG) 0.1 % Apply 1 application topically daily as needed. Compounded with cetaphil lotion (3:1 ratio).  For rash       . warfarin (COUMADIN) 2.5 MG tablet Take 1  tablet daily or as directed  30 tablet  6  . zolpidem (AMBIEN) 10 MG tablet Take 5-10 mg by mouth at bedtime as needed. For sleep        No current facility-administered medications for this visit.    LABS/IMAGING: No results found for this or any previous visit (from the past 48 hour(s)). No results found.  VITALS: BP 130/88  Pulse 63  Ht 5' 8.5" (1.74 m)  Wt 164 lb 9.6 oz (74.662 kg)  BMI 24.66 kg/m2  EXAM: General appearance: alert and no distress Neck: no adenopathy, no carotid bruit, no JVD, supple, symmetrical, trachea midline and thyroid not enlarged, symmetric, no tenderness/mass/nodules Lungs: clear to auscultation bilaterally Heart: irregularly irregular rhythm Abdomen: soft, non-tender; bowel sounds normal; no masses,  no organomegaly Extremities: extremities normal, atraumatic, no cyanosis or edema and venous stasis dermatitis noted Pulses: 2+ and symmetric Skin: Skin color, texture, turgor normal. No rashes or lesions Neurologic: Grossly normal  EKG: Normal sinus rhythm at 60  Pacemaker interrogation (Medtronic REVO MRI): Normal device function. 13 monitored ACT/AF episodes, burden of A. fib 1.6%. 99% V. paced and 98% a paced. DDDR. Mode switch at 171. Upper tract sensing at 1:30 beats per minute. Paced AV delay 300 ms. PMT detection on.  ASSESSMENT: 1. Chronic atrial fibrillation 2. Sick sinus syndrome status post pacemaker placement with normal function 3. Chronic amiodarone use 4. History of abdominal aortic aneurysm with mild enlargement 5. Coronary artery disease status post CABG in 1989 with stent to the vein graft in 2008 6. Minimal PAD with normal ABIs bilaterally  PLAN: 1.   Mr. Jon [name] is doing fairly well. His pacemaker check today shows normal function with appropriate battery life. He is due for screening labs regarding his amiodarone use. I see that Dr. Chilton Si has ordered thyroid function and liver function. We'll go ahead and obtain PFTs to look  for any changes there. Would like to keep him on amiodarone, even low-dose to do is very low burden of atrial fibrillation. He is therapeutic on warfarin and not having any issues related to that except for some nosebleeds mostly in the winter. Plan to see him back in 6 months.  Chrystie Nose, MD, [facility] Attending Cardiologist [facility]  Shraga Custard C 10/01/2012, 2:37 PM

## 2012-10-01 NOTE — Progress Notes (Signed)
In office pacemaker interrogation. Normal device function. Minimal changes made this session. 

## 2012-10-01 NOTE — Patient Instructions (Addendum)
Dr. Rennis Golden has ordered a pulmonary function test.  Lung function tests measure:      How much air you can take into your lungs. This amount is compared with that of other people your age, height, and sex. This allows your doctor to see whether you're in the normal range.     How much air you can blow out of your lungs and how fast you can do it.     How well your lungs deliver oxygen to your blood.     The strength of your breathing muscles.  Your physician wants you to follow-up in: 1 year. You will receive a reminder letter in the mail two months in advance. If you don't receive a letter, please call our office to schedule the follow-up appointment.

## 2012-10-12 ENCOUNTER — Encounter: Payer: Self-pay | Admitting: Internal Medicine

## 2012-10-17 ENCOUNTER — Ambulatory Visit (INDEPENDENT_AMBULATORY_CARE_PROVIDER_SITE_OTHER): Payer: Medicare Other | Admitting: Pharmacist Clinician (PhC)/ Clinical Pharmacy Specialist

## 2012-10-17 VITALS — BP 122/78 | HR 80

## 2012-10-17 DIAGNOSIS — Z7901 Long term (current) use of anticoagulants: Secondary | ICD-10-CM

## 2012-10-17 DIAGNOSIS — I4891 Unspecified atrial fibrillation: Secondary | ICD-10-CM

## 2012-10-22 ENCOUNTER — Encounter (INDEPENDENT_AMBULATORY_CARE_PROVIDER_SITE_OTHER): Payer: Medicare Other

## 2012-10-22 DIAGNOSIS — I509 Heart failure, unspecified: Secondary | ICD-10-CM | POA: Diagnosis not present

## 2012-10-22 DIAGNOSIS — I4891 Unspecified atrial fibrillation: Secondary | ICD-10-CM

## 2012-10-22 DIAGNOSIS — R0602 Shortness of breath: Secondary | ICD-10-CM

## 2012-10-22 DIAGNOSIS — Z79899 Other long term (current) drug therapy: Secondary | ICD-10-CM

## 2012-10-24 ENCOUNTER — Telehealth: Payer: Self-pay | Admitting: *Deleted

## 2012-10-24 DIAGNOSIS — R942 Abnormal results of pulmonary function studies: Secondary | ICD-10-CM

## 2012-10-24 NOTE — Telephone Encounter (Signed)
LM with wife for patient to call office regarding test results

## 2012-10-24 NOTE — Telephone Encounter (Signed)
Message copied by Lindell Spar on Wed Oct 24, 2012  3:12 PM ------      Message from: Chrystie Nose      Created: Wed Oct 24, 2012  1:45 PM       Please notify patient that the lung function tests are abnormal, possibly related to amiodarone. I would like him to stop taking amiodarone. He will need a referral to pulmonary for further work-up of his abnormal PFT's.            -Dr. Rennis Golden       ------

## 2012-10-24 NOTE — Telephone Encounter (Signed)
Called patient with PFT results. Informed of results per Dr. Rennis Golden and requested patient stop amiodarone. Patient verbalized understanding. Informed patient that a referral to [facility] will be made related to the test results. Patient agreed with plan. Reminded of OV on 10/30/12

## 2012-10-24 NOTE — Telephone Encounter (Signed)
Message copied by Lindell Spar on Wed Oct 24, 2012  2:57 PM ------      Message from: Chrystie Nose      Created: Wed Oct 24, 2012  1:45 PM       Please notify patient that the lung function tests are abnormal, possibly related to amiodarone. I would like him to stop taking amiodarone. He will need a referral to pulmonary for further work-up of his abnormal PFT's.            -Dr. Rennis Golden       ------

## 2012-10-30 ENCOUNTER — Ambulatory Visit (INDEPENDENT_AMBULATORY_CARE_PROVIDER_SITE_OTHER): Payer: Medicare Other | Admitting: Internal Medicine

## 2012-10-30 ENCOUNTER — Encounter: Payer: Self-pay | Admitting: Internal Medicine

## 2012-10-30 VITALS — BP 150/88 | HR 62 | Ht 68.5 in | Wt 165.6 lb

## 2012-10-30 DIAGNOSIS — R942 Abnormal results of pulmonary function studies: Secondary | ICD-10-CM | POA: Diagnosis not present

## 2012-10-30 DIAGNOSIS — I4891 Unspecified atrial fibrillation: Secondary | ICD-10-CM

## 2012-10-30 HISTORY — DX: Abnormal results of pulmonary function studies: R94.2

## 2012-10-30 NOTE — Patient Instructions (Addendum)
Your physician wants you to follow-up in:  6 months. You will receive a reminder letter in the mail two months in advance. If you don't receive a letter, please call our office to schedule the follow-up appointment.   

## 2012-10-30 NOTE — Progress Notes (Signed)
OFFICE NOTE  Chief Complaint:  Routine followup  Primary Care Physician: Kimber Relic, MD  HPI:  [name]  is an 79 yo male formerly followed by Dr. Clarene Duke and recently seen by Nada Boozer, NP, for left lower extremity edema with discoloration of his toes, was beginning to be uncomfortable for him to walk. We did venous Dopplers. He was quite concerned about arterial blood supply. His mother ended up with bilateral amputations. Venous Dopplers showed a ruptured baker cyst, no DVT. He was instructed on this and since that time his symptoms have resolved completely. He is on Coumadin for paroxysmal A-fib which led to the discoloration in his toes. Additionally, he has a history of abdominal aortic aneurysm and because he was concerned about his arterial disease, if he had it, we did Dopplers.  He is here for the results of the Dopplers. Bilateral ABIs were 1.0, though he does have appearance of an occluded right anterior tibial artery and a left posterior tib demonstrated a short segment of occlusive disease with reconstitution at the ankle. His pulses have been 2+. He has no claudication symptoms. Additionally, the duplex of his abdominal aorta was slightly, minimally changed from his last one. Previously it was 3.95 x 3.57, now he is 3.7 x 4.0, essentially the same, very slight increase. He has no abdominal pain and no other complaints. He has really no complaints today, feels quite well. He is not aware of any tachycardias or palpitations. He has a Medtronic pacemaker which was interrogated today. This demonstrates a battery voltage of 3.0V.  His a-fib burden is 1.6% and he is on amiodarone.  Other history includes bypass grafting in 1989; vein graft was stented in 2008. Last stress test was 2012, low risk study. EF was 49%. Pacemaker was placed for paroxysmal A-fib and bradycardia, sick sinus syndrome in 2011. He is also on warfarin.   [name] underwent PFTs on 10/22/2012. This showed a  moderately severe restrictive limitation as well as moderately reduced diffusion capacity. Based on these findings I recommended discontinuing his amiodarone and he also stopped his pravastatin. Since that time he's noted that he feels quite a bit better including some minor improvement in his knee pain. He still feels like his left knee is bothering him and he is status post right TKR. 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 I did refer him to the [facility], but has not been contacted for appointment so far.  PMHx:  Past Medical History  Diagnosis Date  . Hypertension   . Coronary artery disease   . Arthritis   . Cataract   . Anxiety   . Blood transfusion without reported diagnosis   . Hyperlipidemia   . Heart murmur   . Allergy   . Thyroid disease   . PAF (paroxysmal atrial fibrillation)     coumadin  . History of abdominal aortic aneurysm   . History of echocardiogram 12/2008    EF >55%; mild concentric LVH; mild MR; mild-mod TR; mild AV regurg;   . History of nuclear stress test 12/2010    lexiscan; low risk; compared to prior study, perfusion improved  . History of Doppler ultrasound 05/22/2012    LEAs; R anterior tibial artery appeared occluded; L posterior tibial shows short segment of occlusive ds  . History of Doppler ultrasound 05/22/2012    Abdominal Aortic Doppler; slight increase in fusiform aneurysm     Past Surgical History  Procedure  Laterality Date  . Coronary artery bypass graft  1989  . Pacemaker insertion  2011  . Insert / replace / remove pacemaker    . Eye surgery    . Joint replacement    . Hernia repair    . Coronary angioplasty with stent placement  2008    stent to SVG to OM    FAMHx:  Family History  Problem Relation Age of Onset  . Diabetes type II Father   . Other Mother     PAD with amputations    SOCHx:   reports that he quit smoking about 45 years ago. His smoking use included Cigarettes. He smoked 0.00  packs per day for 15 years. He has never used smokeless tobacco. He reports that he does not drink alcohol or use illicit drugs.  ALLERGIES:  Allergies  Allergen Reactions  . Caduet [Amlodipine-Atorvastatin]     Weakness   . Crestor [Rosuvastatin]   . Cymbalta [Duloxetine Hcl] Other (See Comments)    Excessive sedation  . Lipitor [Atorvastatin Calcium]     Muscle pain  . Lisinopril   . Simvastatin     Muscle pain    ROS: A comprehensive review of systems was negative except for: Constitutional: positive for fatigue Respiratory: positive for dyspnea on exertion Cardiovascular: positive for irregular heart beat  HOME MEDS: Current Outpatient Prescriptions  Medication Sig Dispense Refill  . allopurinol (ZYLOPRIM) 100 MG tablet Take 50 mg by mouth daily.        Marland Kitchen amoxicillin (AMOXIL) 500 MG tablet Take 500 mg by mouth. Take four tablets one hour prior to dental procedures.      Marland Kitchen aspirin EC 81 MG tablet Take 81 mg by mouth daily.        . cetaphil (CETAPHIL) lotion Apply 1 application topically daily as needed. Compounded with triamcinolone cream 1% (1:3 ratio).  For rash       . Cholecalciferol (VITAMIN D) 400 UNITS capsule Take 400 Units by mouth daily. Take one capsule twice daily      . hydrochlorothiazide (HYDRODIURIL) 25 MG tablet Take 25 mg by mouth daily.      . irbesartan (AVAPRO) 300 MG tablet Take 300 mg by mouth at bedtime. Take one tablet daily for blood pressure      . levothyroxine (SYNTHROID, LEVOTHROID) 100 MCG tablet Take one tablet by mouth once daily for thryoid  90 tablet  3  . LORazepam (ATIVAN) 0.5 MG tablet Take one tablet twice a day as needed.  60 tablet  3  . metoprolol (LOPRESSOR) 50 MG tablet Take 100 mg by mouth 2 (two) times daily.       . Multiple Vitamins-Minerals (MULTIVITAMIN PO) Take by mouth daily.      . nitroGLYCERIN (NITROSTAT) 0.4 MG SL tablet Place 0.4 mg under the tongue every 5 (five) minutes as needed. For chest pain       . OVER THE  COUNTER MEDICATION Take 1 tablet by mouth daily as needed. qc allergy relief multi symptom.  For allergies       . potassium chloride (MICRO-K) 10 MEQ CR capsule Take 10 mEq by mouth. Take one capsule twice daily      . triamcinolone cream (KENALOG) 0.1 % Apply 1 application topically daily as needed. Compounded with cetaphil lotion (3:1 ratio).  For rash       . warfarin (COUMADIN) 2.5 MG tablet Take 1 tablet daily or as directed  30 tablet  6  . zolpidem (  AMBIEN) 10 MG tablet Take 5-10 mg by mouth at bedtime as needed. For sleep        No current facility-administered medications for this visit.    LABS/IMAGING: No results found for this or any previous visit (from the past 48 hour(s)). No results found.  VITALS: BP 150/88  Pulse 62  Ht 5' 8.5" (1.74 m)  Wt 165 lb 9.6 oz (75.116 kg)  BMI 24.81 kg/m2  EXAM: deferred  EKG: Paced rhythm  ASSESSMENT: 1. Chronic atrial fibrillation 2. Sick sinus syndrome status post pacemaker placement with normal function 3. Abnormal PFT's - off of amiodarone 4. History of abdominal aortic aneurysm with mild enlargement 5. Coronary artery disease status post CABG in 1989 with stent to the vein graft in 2008 6. Minimal PAD with normal ABIs bilaterally  PLAN: 1.   Mr. Deis had abnormal PFTs and has been taken off of amiodarone. I looked back and could not find clear records of prior pulmonary function testing while he was on amiodarone under the care of Dr. Clarene Duke. He tells me that he has only been on the drug for 2 or 3 years, which would decrease the likelihood of him having significant amiodarone lung, but I would still like to have him be evaluated by pulmonary. Based on the findings we can consider his risk for knee surgery a little better. He will likely need repeat stress testing as his last stress test was in 2012 and he is a history of bypass grafts dating back to 1989.  Chrystie Nose, MD, [facility] Attending Cardiologist [facility]  Arcola Freshour C 10/30/2012, 3:41 PM

## 2012-10-31 ENCOUNTER — Other Ambulatory Visit: Payer: Self-pay | Admitting: *Deleted

## 2012-10-31 MED ORDER — POTASSIUM CHLORIDE ER 10 MEQ PO CPCR: ORAL_CAPSULE | ORAL | Status: DC

## 2012-11-08 ENCOUNTER — Encounter: Payer: Self-pay | Admitting: Internal Medicine

## 2012-11-08 ENCOUNTER — Other Ambulatory Visit (INDEPENDENT_AMBULATORY_CARE_PROVIDER_SITE_OTHER): Payer: Medicare Other

## 2012-11-08 ENCOUNTER — Ambulatory Visit (INDEPENDENT_AMBULATORY_CARE_PROVIDER_SITE_OTHER)
Admission: RE | Admit: 2012-11-08 | Discharge: 2012-11-08 | Disposition: A | Discharge status: Home / Self Care | Payer: Medicare Other | Source: Ambulatory Visit | Attending: Internal Medicine | Admitting: Internal Medicine

## 2012-11-08 ENCOUNTER — Ambulatory Visit (INDEPENDENT_AMBULATORY_CARE_PROVIDER_SITE_OTHER): Payer: Medicare Other | Admitting: Internal Medicine

## 2012-11-08 VITALS — BP 150/66 | HR 62 | Temp 97.8°F | Ht 67.25 in | Wt 161.4 lb

## 2012-11-08 DIAGNOSIS — J309 Allergic rhinitis, unspecified: Secondary | ICD-10-CM

## 2012-11-08 DIAGNOSIS — R0602 Shortness of breath: Secondary | ICD-10-CM | POA: Diagnosis not present

## 2012-11-08 DIAGNOSIS — R942 Abnormal results of pulmonary function studies: Secondary | ICD-10-CM | POA: Diagnosis not present

## 2012-11-08 LAB — CBC WITH DIFFERENTIAL/PLATELET
Basophils Relative: 0.3 % (ref 0.0–3.0)
Eosinophils Absolute: 0.1 10*3/uL (ref 0.0–0.7)
Eosinophils Relative: 1 % (ref 0.0–5.0)
HCT: 47.4 % (ref 39.0–52.0)
Hemoglobin: 16 g/dL (ref 13.0–17.0)
MCHC: 33.9 g/dL (ref 30.0–36.0)
MCV: 95.4 fl (ref 78.0–100.0)
Monocytes Absolute: 0.9 10*3/uL (ref 0.1–1.0)
Neutro Abs: 4.7 10*3/uL (ref 1.4–7.7)
Neutrophils Relative %: 65.6 % (ref 43.0–77.0)
RBC: 4.97 Mil/uL (ref 4.22–5.81)
WBC: 7.2 10*3/uL (ref 4.5–10.5)

## 2012-11-08 NOTE — Progress Notes (Signed)
  Subjective:    Patient ID: Jon [name], male    DOB: July 01, 1921  MRN: [id number]  HPI   24 yowm quit smoking in 1968 referred by Dr Rennis Golden for ? Amiodarone toxicity (on it for AFib)  11/08/2012 1st [place] [facility] office visit/ Fransico Sciandra cc doe heavy exertion like riding Jon stepper and even then not really noting Jon decline in tolerance since starting amiodarone 2 years ago, stopped last week in Aug 2014 due to abn pfts  Min cough attributed to sinus drainage not worse on amiodarone. Worse p choked on avapro.   No obvious daytime variabilty or assoc   cp or chest tightness, subjective wheeze overt sinus or hb symptoms. No unusual exp hx or h/o childhood pna/ asthma or knowledge of premature birth. No h/o significant connective tissue dz or chemo exposure.   Sleeping ok without nocturnal  or early am exacerbation  of respiratory  c/o's or need for noct saba. Also denies any obvious fluctuation of symptoms with weather or environmental changes or other aggravating or alleviating factors except as outlined above   Current Medications, Allergies, Past Medical History, Past Surgical History, Family History, and Social History were reviewed in [employer] record.   .         Review of Systems  Constitutional: Negative for fever, chills, activity change, appetite change and unexpected weight change.  HENT: Positive for sneezing and dental problem. Negative for congestion, sore throat, rhinorrhea, trouble swallowing, voice change and postnasal drip.   Eyes: Negative for visual disturbance.  Respiratory: Positive for shortness of breath. Negative for cough and choking.   Cardiovascular: Negative for chest pain and leg swelling.  Gastrointestinal: Negative for nausea, vomiting and abdominal pain.  Genitourinary: Negative for difficulty urinating.  Musculoskeletal: Positive for arthralgias.  Skin: Negative for rash.  Psychiatric/Behavioral: Negative for behavioral problems  and confusion.       Objective:   Physical Exam  Wt Readings from Last 3 Encounters:  11/08/12 161 lb 6.4 oz (73.211 kg)  10/30/12 165 lb 9.6 oz (75.116 kg)  10/01/12 164 lb 9.6 oz (74.662 kg)    HEENT: nl dentition, turbinates, and orophanx. Nl external ear canals without cough reflex   NECK :  without JVD/Nodes/TM/ nl carotid upstrokes bilaterally   LUNGS: no acc muscle use, clear to Jon and P bilaterally without cough on insp or exp maneuvers   CV:  RRR  no s3 or murmur or increase in P2, no edema   ABD:  soft and nontender with nl excursion in the supine position. No bruits or organomegaly, bowel sounds nl  MS:  warm without deformities, calf tenderness, cyanosis or clubbing  SKIN: warm and dry without lesions    NEURO:  alert, approp, no deficits    cxr 05/09/12  No acute cardiopulmonary disease.  Aortic atherosclerosis.  CXR  11/08/2012 :  No active disease.  ESR 10      Assessment & Plan:

## 2012-11-08 NOTE — Patient Instructions (Addendum)
Please remember to go to the lab and x-ray department downstairs for your tests - we will call you with the results when they are available.  Please schedule a follow up office visit in 6 weeks, call sooner if needed with pfts

## 2012-11-09 ENCOUNTER — Encounter: Payer: Self-pay | Admitting: Internal Medicine

## 2012-11-09 LAB — ALLERGY PROFILE REGION II-DC, DE, MD, ~~LOC~~, VA
Allergen, D pternoyssinus,d7: <0.1 kU/L
Alternaria Alternata: <0.1 kU/L
Aspergillus fumigatus, m3: <0.1 kU/L
Bermuda Grass: <0.1 kU/L
Box Elder IgE: <0.1 kU/L
Cladosporium Herbarum: <0.1 kU/L
Cockroach: <0.1 kU/L
Common Ragweed: <0.1 kU/L
D. farinae: <0.1 kU/L
Oak: <0.1 kU/L

## 2012-11-09 NOTE — Progress Notes (Signed)
Quick Note:  Spoke with pt and notified of results per Dr. Wert. Pt verbalized understanding and denied any questions.  ______ 

## 2012-11-09 NOTE — Assessment & Plan Note (Signed)
rec otc's not to include any sudafed due to tendency to afib

## 2012-11-09 NOTE — Assessment & Plan Note (Signed)
-   PFT's 10/22/12 VC  55% no obst, DLCO 50%  -11/08/2012  Walked RA x 3 laps @ 185 ft each stopped due to  End of study, not desat -11/08/12 esr 10   No definite amio toxicity ? If we have a baseline pft available prior to [facility] but he certainly could have early subclinical effects and the risk benefit for rx of afib is not as defined as vfib in this setting so agree with stop rx and repeat pft's in 3 months  See instructions for specific recommendations which were reviewed directly with the patient who was given a copy with highlighter outlining the key components.

## 2012-11-14 ENCOUNTER — Ambulatory Visit (INDEPENDENT_AMBULATORY_CARE_PROVIDER_SITE_OTHER): Payer: Medicare Other | Admitting: Pharmacist Clinician (PhC)/ Clinical Pharmacy Specialist

## 2012-11-14 VITALS — BP 140/76 | HR 68

## 2012-11-14 DIAGNOSIS — I4891 Unspecified atrial fibrillation: Secondary | ICD-10-CM | POA: Diagnosis not present

## 2012-11-14 DIAGNOSIS — Z7901 Long term (current) use of anticoagulants: Secondary | ICD-10-CM

## 2012-11-14 LAB — POCT INR: INR: 2.3

## 2012-11-26 ENCOUNTER — Other Ambulatory Visit: Payer: Self-pay

## 2012-11-26 MED ORDER — ALLOPURINOL 100 MG PO TABS: 50.0000 mg | ORAL_TABLET | Freq: Every day | ORAL | Status: DC

## 2012-11-26 MED ORDER — ZOLPIDEM TARTRATE 10 MG PO TABS: 5.0000 mg | ORAL_TABLET | Freq: Every evening | ORAL | Status: DC | PRN

## 2012-11-26 NOTE — Telephone Encounter (Signed)
Per Pharmacy patient taking 100 mg once daily, our medication list states 100 mg 1/2 tab daily

## 2012-12-05 ENCOUNTER — Ambulatory Visit (INDEPENDENT_AMBULATORY_CARE_PROVIDER_SITE_OTHER): Payer: Medicare Other | Admitting: Pharmacist Clinician (PhC)/ Clinical Pharmacy Specialist

## 2012-12-05 VITALS — BP 150/76 | HR 72

## 2012-12-05 DIAGNOSIS — I4891 Unspecified atrial fibrillation: Secondary | ICD-10-CM | POA: Diagnosis not present

## 2012-12-05 DIAGNOSIS — Z7901 Long term (current) use of anticoagulants: Secondary | ICD-10-CM

## 2012-12-19 DIAGNOSIS — Z23 Encounter for immunization: Secondary | ICD-10-CM | POA: Diagnosis not present

## 2012-12-20 ENCOUNTER — Ambulatory Visit (INDEPENDENT_AMBULATORY_CARE_PROVIDER_SITE_OTHER): Payer: Medicare Other | Admitting: Internal Medicine

## 2012-12-20 ENCOUNTER — Encounter: Payer: Self-pay | Admitting: Internal Medicine

## 2012-12-20 VITALS — BP 150/70 | HR 67 | Temp 97.5°F | Ht 68.0 in | Wt 162.0 lb

## 2012-12-20 DIAGNOSIS — R942 Abnormal results of pulmonary function studies: Secondary | ICD-10-CM

## 2012-12-20 DIAGNOSIS — I4891 Unspecified atrial fibrillation: Secondary | ICD-10-CM | POA: Diagnosis not present

## 2012-12-20 LAB — PULMONARY FUNCTION TEST

## 2012-12-20 NOTE — Patient Instructions (Signed)
No evidence of significant amiodarone toxicity but if you need to start it back up you need to return 6 weeks later for repeat  pfts

## 2012-12-20 NOTE — Progress Notes (Signed)
  Subjective:    Patient ID: [name], male    DOB: [birth year]  MRN: [id number]     Brief patient profile:  89 yowm quit smoking in 1968 referred by Dr Jon [name] for ? Amiodarone toxicity (on it for AFib)   History of Present Illness  11/08/2012 1st [facility] office visit/ Wert cc doe heavy exertion like riding a stepper and even then not really noting a decline in tolerance since starting amiodarone 2 years ago, stopped last week in Aug 2014 due to abn pfts  Min cough attributed to sinus drainage not worse on amiodarone. Worse p choked on avapro.  rec No change rx   12/20/2012 f/u ov/Wert re: ? amio effect  Chief Complaint  Patient presents with  . Follow-up    PFT done today.    Not limited by breathing but by knees and that was   True before  and after stopping  the amio  No obvious day to day or daytime variabilty or assoc chronic cough or cp or chest tightness, subjective wheeze overt sinus or hb symptoms. No unusual exp hx or h/o childhood pna/ asthma or knowledge of premature birth.  Sleeping ok without nocturnal  or early am exacerbation  of respiratory  c/o's or need for noct saba. Also denies any obvious fluctuation of symptoms with weather or environmental changes or other aggravating or alleviating factors except as outlined above   Current Medications, Allergies, Complete Past Medical History, Past Surgical History, Family History, and Social History were reviewed in [employer] record.  ROS  The following are not active complaints unless bolded sore throat, dysphagia, dental problems, itching, sneezing,  nasal congestion or excess/ purulent secretions, ear ache,   fever, chills, sweats, unintended wt loss, pleuritic or exertional cp, hemoptysis,  orthopnea pnd or leg swelling, presyncope, palpitations, heartburn, abdominal pain, anorexia, nausea, vomiting, diarrhea  or change in bowel or urinary habits, change in stools or urine,  dysuria,hematuria,  rash, arthralgias, visual complaints, headache, numbness weakness or ataxia or problems with walking or coordination,  change in mood/affect or memory.     .   .               Objective:   Physical Exam   Wt Readings from Last 3 Encounters:  12/20/12 162 lb (73.483 kg)  11/08/12 161 lb 6.4 oz (73.211 kg)  10/30/12 165 lb 9.6 oz (75.116 kg)       HEENT: nl dentition, turbinates, and orophanx. Nl external ear canals without cough reflex   NECK :  without JVD/Nodes/TM/ nl carotid upstrokes bilaterally   LUNGS: no acc muscle use, clear to A and P bilaterally without cough on insp or exp maneuvers   CV:  RRR  no s3 or murmur or increase in P2, no edema   ABD:  soft and nontender with nl excursion in the supine position. No bruits or organomegaly, bowel sounds nl  MS:  warm without deformities, calf tenderness, cyanosis or clubbing  SKIN: warm and dry without lesions    NEURO:  alert, approp, no deficits    cxr 05/09/12  No acute cardiopulmonary disease.  Aortic atherosclerosis.  CXR  11/08/2012 :  No active disease.  ESR 10      Assessment & Plan:

## 2012-12-20 NOTE — Progress Notes (Signed)
PFT done today. 

## 2012-12-21 NOTE — Assessment & Plan Note (Signed)
-   PFT's 10/22/12 VC  55% no obst, DLCO 50%  - PFT's 12/20/2012 VC 83% and no obst, dLCO 55%  -11/08/2012  Walked RA x 3 laps @ 185 ft each stopped due to  End of study, not desat -11/08/12 esr 10    I had an extended summary final  discussion with the patient today lasting 15 to 20 minutes of a 25 minute visit on the following issues:  PFt's reviewed from our machine vs the other - his pft's are nl x min decrease in DLCO Doubt he ever had significant amiodarone toxicity though hard to be completely sure so if he needed it in future would restart then return here in 6-8 weeks on it for repeat baseline (the more points on the curve on the same machine the better).  Pulmonary f/u is prn

## 2013-01-02 ENCOUNTER — Ambulatory Visit (INDEPENDENT_AMBULATORY_CARE_PROVIDER_SITE_OTHER): Payer: Medicare Other | Admitting: Pharmacist Clinician (PhC)/ Clinical Pharmacy Specialist

## 2013-01-02 VITALS — BP 166/80 | HR 72

## 2013-01-02 DIAGNOSIS — Z7901 Long term (current) use of anticoagulants: Secondary | ICD-10-CM

## 2013-01-02 DIAGNOSIS — I4891 Unspecified atrial fibrillation: Secondary | ICD-10-CM

## 2013-01-07 ENCOUNTER — Encounter: Payer: Self-pay | Admitting: Cardiovascular Disease

## 2013-01-07 ENCOUNTER — Ambulatory Visit (INDEPENDENT_AMBULATORY_CARE_PROVIDER_SITE_OTHER): Payer: Medicare Other | Admitting: Cardiovascular Disease

## 2013-01-07 VITALS — BP 153/72 | HR 61 | Resp 20 | Ht 68.5 in | Wt 164.9 lb

## 2013-01-07 DIAGNOSIS — Z950 Presence of cardiac pacemaker: Secondary | ICD-10-CM | POA: Diagnosis not present

## 2013-01-07 DIAGNOSIS — I4891 Unspecified atrial fibrillation: Secondary | ICD-10-CM

## 2013-01-07 LAB — PACEMAKER DEVICE OBSERVATION

## 2013-01-07 NOTE — Patient Instructions (Signed)
Remote monitoring is used to monitor your pacemaker  from home. This monitoring reduces the number of office visits required to check your device to one time per year. It allows [name] to keep an eye on the functioning of your device to ensure it is working properly. You are scheduled for a device check from home on 04-10-2013. You may send your transmission at any time that day. If you have a wireless device, the transmission will be sent automatically. After your physician reviews your transmission, you will receive a postcard with your next transmission date.  Your physician recommends that you schedule a follow-up appointment in: 12 months

## 2013-01-08 DIAGNOSIS — E079 Disorder of thyroid, unspecified: Secondary | ICD-10-CM | POA: Diagnosis not present

## 2013-01-08 DIAGNOSIS — E119 Type 2 diabetes mellitus without complications: Secondary | ICD-10-CM | POA: Diagnosis not present

## 2013-01-08 DIAGNOSIS — E785 Hyperlipidemia, unspecified: Secondary | ICD-10-CM | POA: Diagnosis not present

## 2013-01-08 LAB — BASIC METABOLIC PANEL
BUN: 15 mg/dL (ref 4–21)
Creatinine: 1 mg/dL (ref 0.6–1.3)
Glucose: 119 mg/dL
Potassium: 4.3 mmol/L (ref 3.4–5.3)
Sodium: 133 mmol/L — AB (ref 137–147)

## 2013-01-08 LAB — PACEMAKER DEVICE OBSERVATION
AL IMPEDENCE PM: 528 Ohm
AL THRESHOLD: 1 V
ATRIAL PACING PM: 99.1
RV LEAD AMPLITUDE: 12.2 mv
RV LEAD THRESHOLD: 1 V
VENTRICULAR PACING PM: 99.3

## 2013-01-08 LAB — LIPID PANEL
Cholesterol: 212 mg/dL — AB (ref 0–200)
HDL: 43 mg/dL (ref 35–70)
LDL Cholesterol: 141 mg/dL

## 2013-01-08 LAB — HEPATIC FUNCTION PANEL
ALT: 21 U/L (ref 10–40)
AST: 26 U/L (ref 14–40)
Alkaline Phosphatase: 89 U/L (ref 25–125)

## 2013-01-14 ENCOUNTER — Encounter: Payer: Self-pay | Admitting: Internal Medicine

## 2013-01-16 NOTE — Assessment & Plan Note (Signed)
Normal battery and lead parameters. Overall burden of atrial correlation is low. Although not truly pacemaker dependent he has an extremely slow underlying rhythm at about 30 beats per minutes and paces both the atrium and the ventricle almost 100% of the time. No permanent changes made device settings. Continue remote monitoring every 3 months in followup in the office in one year.

## 2013-01-16 NOTE — Progress Notes (Signed)
Patient ID: Jon [name], male   DOB: 12-29-1921, 79 y.o.   MRN: [id number]      Reason for office visit Pacemaker followup  Jon [name] returns for his yearly pacemaker followup. This 79 year old gentleman has Jon history of coronary disease and underwent bypass surgery in 1989 with Jon stent to his saphenous vein graft in 2008. He has borderline left ventricular systolic function with an ejection fraction of 49%. In 2011 he received Jon dual-chamber permanent pacemaker for tachycardia-bradycardia syndrome with paroxysmal atrial fibrillation. He is on warfarin anticoagulation therapy. He has not had any new focal neurological events or bleeding complications. He has not had falls with syncope or palpitations. He has treated hypertension hyperlipidemia and Jon moderate size abdominal aortic aneurysm.   Amiodarone has been stopped for abnormal pulmonary function tests. On pacemaker check today his overall burden of atrial fibrillation is only 0.3%, mostly consisting of 15 hour episode in August.   Allergies  Allergen Reactions  . Caduet [Amlodipine-Atorvastatin]     Weakness   . Crestor [Rosuvastatin]   . Cymbalta [Duloxetine Hcl] Other (See Comments)    Excessive sedation  . Lipitor [Atorvastatin Calcium]     Muscle pain  . Lisinopril   . Simvastatin     Muscle pain    Current Outpatient Prescriptions  Medication Sig Dispense Refill  . allopurinol (ZYLOPRIM) 100 MG tablet Take 0.5 tablets (50 mg total) by mouth daily.  30 tablet  5  . amoxicillin (AMOXIL) 500 MG tablet Take 500 mg by mouth. Take four tablets one hour prior to dental procedures.      Marland Kitchen aspirin EC 81 MG tablet Take 81 mg by mouth daily.        . cetaphil (CETAPHIL) lotion Apply 1 application topically daily as needed. Compounded with triamcinolone cream 1% (1:3 ratio).  For rash       . Cholecalciferol (VITAMIN D) 400 UNITS capsule Take 400 Units by mouth daily. Take one capsule twice daily      . diphenhydrAMINE (BENADRYL) 25  MG tablet Take 25 mg by mouth every 6 (six) hours as needed for itching.      . hydrochlorothiazide (HYDRODIURIL) 25 MG tablet Take 25 mg by mouth daily.      . irbesartan (AVAPRO) 300 MG tablet Take 300 mg by mouth at bedtime. Take one tablet daily for blood pressure      . levothyroxine (SYNTHROID, LEVOTHROID) 100 MCG tablet Take one tablet by mouth once daily for thryoid  90 tablet  3  . LORazepam (ATIVAN) 0.5 MG tablet Take one tablet twice Jon day as needed.  60 tablet  3  . metoprolol (LOPRESSOR) 50 MG tablet Take 100 mg by mouth 2 (two) times daily.       . nitroGLYCERIN (NITROSTAT) 0.4 MG SL tablet Place 0.4 mg under the tongue every 5 (five) minutes as needed. For chest pain       . OVER THE COUNTER MEDICATION Take 1 tablet by mouth daily as needed. qc allergy relief multi symptom.  For allergies       . potassium chloride (MICRO-K) 10 MEQ CR capsule Take one capsule twice daily  60 capsule  6  . warfarin (COUMADIN) 2.5 MG tablet Take 1 tablet daily or as directed  30 tablet  6  . zolpidem (AMBIEN) 10 MG tablet Take 0.5-1 tablets (5-10 mg total) by mouth at bedtime as needed. For sleep  30 tablet  5   No current facility-administered medications for  this visit.    Past Medical History  Diagnosis Date  . Hypertension   . Coronary artery disease   . Arthritis   . Cataract   . Anxiety   . Blood transfusion without reported diagnosis   . Hyperlipidemia   . Heart murmur   . Allergy   . Thyroid disease   . PAF (paroxysmal atrial fibrillation)     coumadin  . History of abdominal aortic aneurysm   . History of echocardiogram 12/2008    EF >55%; mild concentric LVH; mild MR; mild-mod TR; mild AV regurg;   . History of nuclear stress test 12/2010    lexiscan; low risk; compared to prior study, perfusion improved  . History of Doppler ultrasound 05/22/2012    LEAs; R anterior tibial artery appeared occluded; L posterior tibial shows short segment of occlusive ds  . History of Doppler  ultrasound 05/22/2012    Abdominal Aortic Doppler; slight increase in fusiform aneurysm     Past Surgical History  Procedure Laterality Date  . Coronary artery bypass graft  1989  . Pacemaker insertion  2011  . Insert / replace / remove pacemaker    . Eye surgery    . Joint replacement    . Hernia repair    . Coronary angioplasty with stent placement  2008    stent to SVG to OM    Family History  Problem Relation Age of Onset  . Diabetes type II Father   . Other Mother     PAD with amputations    History   Social History  . Marital Status: Married    Spouse Name: N/Jon    Number of Children: 3  . Years of Education: N/Jon   Occupational History  . Retired Psychologist, sport and exercise    Social History Main Topics  . Smoking status: Former Smoker -- 0.75 packs/day for 15 years    Types: Cigarettes    Quit date: 03/04/1967  . Smokeless tobacco: Never Used  . Alcohol Use: No  . Drug Use: No  . Sexual Activity: No   Other Topics Concern  . Not on file   Social History Narrative  . No narrative on file    Review of systems: The patient specifically denies any chest pain at rest or with exertion, dyspnea at rest or with exertion, orthopnea, paroxysmal nocturnal dyspnea, syncope, palpitations, focal neurological deficits, intermittent claudication, lower extremity edema, unexplained weight gain, cough, hemoptysis or wheezing.  The patient also denies abdominal pain, nausea, vomiting, dysphagia, diarrhea, constipation, polyuria, polydipsia, dysuria, hematuria, frequency, urgency, abnormal bleeding or bruising, fever, chills, unexpected weight changes, mood swings, change in skin or hair texture, change in voice quality, auditory or visual problems, allergic reactions or rashes, new musculoskeletal complaints other than usual "aches and pains".   PHYSICAL EXAM BP 153/72  Pulse 61  Resp 20  Ht 5' 8.5" (1.74 m)  Wt 164 lb 14.4 oz (74.798 kg)  BMI 24.71 kg/m2  General: Alert,  oriented x3, no distress Head: no evidence of trauma, PERRL, EOMI, no exophtalmos or lid lag, no myxedema, no xanthelasma; normal ears, nose and oropharynx Neck: normal jugular venous pulsations and no hepatojugular reflux; brisk carotid pulses without delay and no carotid bruits Chest: clear to auscultation, no signs of consolidation by percussion or palpation, normal fremitus, symmetrical and full respiratory excursions Cardiovascular: normal position and quality of the apical impulse, regular rhythm, normal first and second heart sounds, no murmurs, rubs or gallops Abdomen: no tenderness or  distention, no masses by palpation, no abnormal pulsatility or arterial bruits, normal bowel sounds, no hepatosplenomegaly Extremities: no clubbing, cyanosis or edema; 2+ radial, ulnar and brachial pulses bilaterally; 2+ right femoral, posterior tibial and dorsalis pedis pulses; 2+ left femoral, posterior tibial and dorsalis pedis pulses; no subclavian or femoral bruits Neurological: grossly nonfocal   EKG:   Pacemaker check in clinic. Normal device function. Thresholds, sensing, impedances consistent with previous measurements. Device programmed to maximize longevity. 3 AT/AF episodes(0.3%)---max dur. >5 hours (8-11)---Max Jon 238----AFL + Warfarin. No high ventricular rates noted. Device programmed at appropriate safety margins. Histogram distribution appropriate for patient activity level. Device programmed to optimize intrinsic conduction. Patient will follow up remotely on 04-10-2013   Lipid Panel     Component Value Date/Time   CHOL  Value: 153        ATP III CLASSIFICATION:  <200     mg/dL   Desirable  161-096  mg/dL   Borderline High  >=045    mg/dL   High        40/98/1191 0125   TRIG 111 03/01/2009 0125   HDL 43 03/01/2009 0125   CHOLHDL 3.6 03/01/2009 0125   VLDL 22 03/01/2009 0125   LDLCALC  Value: 88        Total Cholesterol/HDL:CHD Risk Coronary Heart Disease Risk Table                     Men    Women  1/2 Average Risk   3.4   3.3  Average Risk       5.0   4.4  2 X Average Risk   9.6   7.1  3 X Average Risk  23.4   11.0        Use the calculated Patient Ratio above and the CHD Risk Table to determine the patient's CHD Risk.        ATP III CLASSIFICATION (LDL):  <100     mg/dL   Optimal  478-295  mg/dL   Near or Above                    Optimal  130-159  mg/dL   Borderline  621-308  mg/dL   High  >657     mg/dL   Very High 84/69/6295 0125    BMET    Component Value Date/Time   NA 130* 03/08/2011 0700   K 3.7 03/08/2011 0700   CL 91* 03/08/2011 0700   CO2 31 03/08/2011 0700   GLUCOSE 116* 03/08/2011 0700   BUN 20 03/08/2011 0700   CREATININE 1.01 03/08/2011 0700   CALCIUM 8.7 03/08/2011 0700   GFRNONAA 65* 03/08/2011 0700   GFRAA 75* 03/08/2011 0700     ASSESSMENT AND PLAN Atrial fibrillation Infrequent, well rate controlled and asymptomatic spells of atrial fibrillation recorded by his pacemaker. The risk of antiarrhythmic therapy does not appear to be justified at this time.  Cardiac pacemaker in situ Normal battery and lead parameters. Overall burden of atrial correlation is low. Although not truly pacemaker dependent he has an extremely slow underlying rhythm at about 30 beats per minutes and paces both the atrium and the ventricle almost 100% of the time. No permanent changes made device settings. Continue remote monitoring every 3 months in followup in the office in one year.   Orders Placed This Encounter  Procedures  . Pacemaker Device Observation   Patient Instructions  Remote monitoring is used to monitor your pacemaker  from home. This monitoring reduces the number of office visits required to check your device to one time per year. It allows [name] to keep an eye on the functioning of your device to ensure it is working properly. You are scheduled for Jon device check from home on 04-10-2013. You may send your transmission at any time that day. If you have Jon wireless device, the transmission  will be sent automatically. After your physician reviews your transmission, you will receive Jon postcard with your next transmission date.  Your physician recommends that you schedule Jon follow-up appointment in: 12 months        Lando Alcalde  Thurmon Fair, MD, [facility] [phone] office [phone] pager

## 2013-01-16 NOTE — Assessment & Plan Note (Signed)
Infrequent, well rate controlled and asymptomatic spells of atrial fibrillation recorded by his pacemaker. The risk of antiarrhythmic therapy does not appear to be justified at this time.

## 2013-01-17 ENCOUNTER — Encounter: Payer: Self-pay | Admitting: Internal Medicine

## 2013-01-24 ENCOUNTER — Encounter: Payer: Self-pay | Admitting: Nurse Practitioner

## 2013-01-24 ENCOUNTER — Non-Acute Institutional Stay: Payer: Medicare Other | Admitting: Nurse Practitioner

## 2013-01-24 ENCOUNTER — Ambulatory Visit (INDEPENDENT_AMBULATORY_CARE_PROVIDER_SITE_OTHER): Payer: Medicare Other | Admitting: Pharmacist Clinician (PhC)/ Clinical Pharmacy Specialist

## 2013-01-24 VITALS — BP 126/80 | HR 72 | Ht 68.5 in | Wt 164.0 lb

## 2013-01-24 VITALS — BP 164/80 | HR 84

## 2013-01-24 DIAGNOSIS — I4891 Unspecified atrial fibrillation: Secondary | ICD-10-CM

## 2013-01-24 DIAGNOSIS — R942 Abnormal results of pulmonary function studies: Secondary | ICD-10-CM | POA: Diagnosis not present

## 2013-01-24 DIAGNOSIS — L299 Pruritus, unspecified: Secondary | ICD-10-CM

## 2013-01-24 DIAGNOSIS — Z7901 Long term (current) use of anticoagulants: Secondary | ICD-10-CM | POA: Diagnosis not present

## 2013-01-24 DIAGNOSIS — M109 Gout, unspecified: Secondary | ICD-10-CM

## 2013-01-24 DIAGNOSIS — E871 Hypo-osmolality and hyponatremia: Secondary | ICD-10-CM | POA: Diagnosis not present

## 2013-01-24 DIAGNOSIS — R413 Other amnesia: Secondary | ICD-10-CM

## 2013-01-24 DIAGNOSIS — E039 Hypothyroidism, unspecified: Secondary | ICD-10-CM

## 2013-01-24 DIAGNOSIS — E785 Hyperlipidemia, unspecified: Secondary | ICD-10-CM

## 2013-01-24 DIAGNOSIS — E119 Type 2 diabetes mellitus without complications: Secondary | ICD-10-CM

## 2013-01-24 DIAGNOSIS — I509 Heart failure, unspecified: Secondary | ICD-10-CM | POA: Diagnosis not present

## 2013-01-24 DIAGNOSIS — I10 Essential (primary) hypertension: Secondary | ICD-10-CM

## 2013-01-24 DIAGNOSIS — F329 Major depressive disorder, single episode, unspecified: Secondary | ICD-10-CM

## 2013-01-24 DIAGNOSIS — R350 Frequency of micturition: Secondary | ICD-10-CM

## 2013-01-24 DIAGNOSIS — Z950 Presence of cardiac pacemaker: Secondary | ICD-10-CM

## 2013-01-24 HISTORY — DX: Other amnesia: R41.3

## 2013-01-24 HISTORY — DX: Pruritus, unspecified: L29.9

## 2013-01-24 LAB — POCT INR: INR: 1.8

## 2013-01-24 NOTE — Assessment & Plan Note (Signed)
Diet controlled, Hgb A1c6.0 01/08/13   

## 2013-01-24 NOTE — Assessment & Plan Note (Signed)
Stated since he was a little kid--seeing Dermatology and takes Benadryl.

## 2013-01-24 NOTE — Assessment & Plan Note (Signed)
Takes Levothyroxine 100mcg, TSH 3.597 01/08/13   

## 2013-01-24 NOTE — Assessment & Plan Note (Signed)
Not new, takes HCTZ last serum Na 133 01/08/13

## 2013-01-24 NOTE — Assessment & Plan Note (Signed)
LDL 141, continue diet control.

## 2013-01-24 NOTE — Assessment & Plan Note (Signed)
Stated he forgets peoples's names which he was used to be good at.   

## 2013-01-24 NOTE — Assessment & Plan Note (Signed)
Rate controlled, long term anticoagulation with Coumadin.

## 2013-01-24 NOTE — Assessment & Plan Note (Signed)
The patient is aware of the result.

## 2013-01-24 NOTE — Progress Notes (Signed)
Patient ID: [name], male   DOB: [birth year], 79 y.o.   MRN: [id number]  Allergies  Allergen Reactions  . Caduet [Amlodipine-Atorvastatin]     Weakness   . Crestor [Rosuvastatin]   . Cymbalta [Duloxetine Hcl] Other (See Comments)    Excessive sedation  . Lipitor [Atorvastatin Calcium]     Muscle pain  . Lisinopril   . Simvastatin     Muscle pain    Chief Complaint  Patient presents with  . Medical Managment of Chronic Issues    blood sugar, blood pressure, thyroid, cholesterol    HPI: Patient is a 79 y.o. male seen in the clinic at [facility] today for urinary leakage/frequency, anxiety/insomnia, pruritus, hypothyroidism, HTN, gout, memory,  and other chronic medical conditions.  Problem List Items Addressed This Visit   Abnormal PFTs     The patient is aware of the result.     Atrial fibrillation (Chronic)     Rate controlled, long term anticoagulation with Coumadin.     Relevant Medications      warfarin (COUMADIN) 2.5 MG tablet   Cardiac pacemaker in situ     F/u device clinic.     Congestive heart failure, unspecified     Compensated, takes HCTZ.     Gout, unspecified     No flare ups. Takes Allopurinol 50mg  daily.     HTN (hypertension) (Chronic)     Controlled on Avapro 300mg , Metoprolol 100mg  bid    Hyponatremia - Primary     Not new, takes HCTZ last serum Na 133 01/08/13    Hypothyroidism (Chronic)     Takes Levothyroxine , TSH 3.597 01/08/13    Major depressive disorder, single episode, unspecified     Difficulty falling asleep--takes Ativan almost routinely-occasional takes Ambien.     Memory change     Stated he forgets peoples's names which he was used to be good at.     Other and unspecified hyperlipidemia (Chronic)     LDL 141, continue diet control.     Pruritic condition   Pruritus     Stated since he was a little kid--seeing Dermatology and takes Benadryl.     Type II or unspecified type diabetes mellitus without  mention of complication, not stated as uncontrolled (Chronic)     Diet controlled, Hgb A1c6.0 01/08/13    Urinary frequency     2-3x/night bathroom trips with urgency and leakage--plan to participate Kegel exercise.         Review of Systems:  Review of Systems  Constitutional: Negative for fever, chills, weight loss, malaise/fatigue and diaphoresis.  HENT: Positive for hearing loss. Negative for congestion, ear discharge, ear pain, nosebleeds, sore throat and tinnitus.   Eyes: Negative for blurred vision, double vision, photophobia, pain, discharge and redness.  Respiratory: Positive for cough. Negative for hemoptysis, sputum production, shortness of breath, wheezing and stridor.   Cardiovascular: Positive for leg swelling and PND. Negative for chest pain, palpitations, orthopnea and claudication.  Gastrointestinal: Negative for heartburn, nausea, abdominal pain, diarrhea, constipation and blood in stool.  Genitourinary: Positive for urgency and frequency. Negative for dysuria, hematuria and flank pain.  Musculoskeletal: Negative for back pain, falls, joint pain, myalgias and neck pain.  Skin: Positive for itching and rash.       Red spots on face.   Neurological: Negative for dizziness, tingling, tremors, sensory change, speech change, focal weakness, seizures, loss of consciousness, weakness and headaches.  Endo/Heme/Allergies: Positive for environmental allergies. Negative for polydipsia.  Does not bruise/bleed easily.  Psychiatric/Behavioral: Positive for depression and memory loss. Negative for suicidal ideas, hallucinations and substance abuse. The patient is nervous/anxious and has insomnia.      Past Medical History  Diagnosis Date  . Hypertension   . Coronary artery disease   . Arthritis   . Cataract   . Anxiety   . Blood transfusion without reported diagnosis   . Hyperlipidemia   . Heart murmur   . Allergy   . Thyroid disease   . PAF (paroxysmal atrial fibrillation)      coumadin  . History of abdominal aortic aneurysm   . History of echocardiogram 12/2008    EF >55%; mild concentric LVH; mild MR; mild-mod TR; mild AV regurg;   . History of nuclear stress test 12/2010    lexiscan; low risk; compared to prior study, perfusion improved  . History of Doppler ultrasound 05/22/2012    LEAs; R anterior tibial artery appeared occluded; L posterior tibial shows short segment of occlusive ds  . History of Doppler ultrasound 05/22/2012    Abdominal Aortic Doppler; slight increase in fusiform aneurysm    Past Surgical History  Procedure Laterality Date  . Coronary artery bypass graft  1989  . Pacemaker insertion  2011  . Insert / replace / remove pacemaker    . Eye surgery    . Joint replacement    . Hernia repair    . Coronary angioplasty with stent placement  2008    stent to SVG to OM   Social History:   reports that he quit smoking about 45 years ago. His smoking use included Cigarettes. He has a 11.25 pack-year smoking history. He has never used smokeless tobacco. He reports that he does not drink alcohol or use illicit drugs.  Family History  Problem Relation Age of Onset  . Diabetes type II Father   . Other Mother     PAD with amputations    Medications: Patient's Medications  New Prescriptions   No medications on file  Previous Medications   ALLOPURINOL (ZYLOPRIM) 100 MG TABLET    Take 0.5 tablets (50 mg total) by mouth daily.   AMOXICILLIN (AMOXIL) 500 MG TABLET    Take 500 mg by mouth. Take four tablets one hour prior to dental procedures.   ASPIRIN EC 81 MG TABLET    Take 81 mg by mouth daily.     CETAPHIL (CETAPHIL) LOTION    Apply 1 application topically daily as needed. Compounded with triamcinolone cream 1% (1:3 ratio).  For rash    CHOLECALCIFEROL (VITAMIN D) 400 UNITS CAPSULE    Take 400 Units by mouth daily. Take one capsule twice daily   DIPHENHYDRAMINE (BENADRYL) 25 MG TABLET    Take 25 mg by mouth every 6 (six) hours as needed  for itching.   HYDROCHLOROTHIAZIDE (HYDRODIURIL) 25 MG TABLET    Take 25 mg by mouth daily.   IRBESARTAN (AVAPRO) 300 MG TABLET    Take 300 mg by mouth at bedtime. Take one tablet daily for blood pressure   LEVOTHYROXINE (SYNTHROID, LEVOTHROID) 100 MCG TABLET    Take one tablet by mouth once daily for thryoid   LORAZEPAM (ATIVAN) 0.5 MG TABLET    Take one tablet twice a day as needed.   METOPROLOL (LOPRESSOR) 50 MG TABLET    Take 100 mg by mouth 2 (two) times daily.    NITROGLYCERIN (NITROSTAT) 0.4 MG SL TABLET    Place 0.4 mg under the tongue every  5 (five) minutes as needed. For chest pain    OVER THE COUNTER MEDICATION    Take 1 tablet by mouth daily as needed. qc allergy relief multi symptom.  For allergies    POTASSIUM CHLORIDE (MICRO-K) 10 MEQ CR CAPSULE    Take one capsule twice daily   ZOLPIDEM (AMBIEN) 10 MG TABLET    Take 0.5-1 tablets (5-10 mg total) by mouth at bedtime as needed. For sleep  Modified Medications   Modified Medication Previous Medication   WARFARIN (COUMADIN) 2.5 MG TABLET warfarin (COUMADIN) 2.5 MG tablet      2.5 mg. Take 1 tablet daily except Monday take 1/2 tablet, or as directed    Take 1 tablet daily or as directed  Discontinued Medications   No medications on file     Physical Exam: Physical Exam  Constitutional: He is oriented to person, place, and time. He appears well-developed and well-nourished. No distress.  HENT:  Head: Normocephalic and atraumatic.  Right Ear: External ear normal.  Left Ear: External ear normal.  Nose: Nose normal.  Mouth/Throat: Oropharynx is clear and moist. No oropharyngeal exudate.  Eyes: Conjunctivae and EOM are normal. Pupils are equal, round, and reactive to light. Right eye exhibits no discharge. Left eye exhibits no discharge. No scleral icterus.  Neck: Normal range of motion. Neck supple. No JVD present. No tracheal deviation present. No thyromegaly present.  Cardiovascular: Normal rate, regular rhythm and normal heart  sounds.   No murmur heard. Pulmonary/Chest: Effort normal and breath sounds normal. No stridor. No respiratory distress. He has no wheezes. He has no rales.  Abdominal: Soft. Bowel sounds are normal. He exhibits no distension. There is no tenderness. There is no rebound and no guarding.  Musculoskeletal: Normal range of motion. He exhibits no edema and no tenderness.  Lymphadenopathy:    He has no cervical adenopathy.  Neurological: He is alert and oriented to person, place, and time. He has normal reflexes. He displays normal reflexes. No cranial nerve deficit. He exhibits normal muscle tone. Coordination normal.  Skin: Skin is warm and dry. Rash noted. He is not diaphoretic. No erythema.  Psychiatric: His mood appears anxious. His affect is not angry, not blunt, not labile and not inappropriate. His speech is not delayed, not tangential and not slurred. He is not agitated, not aggressive, not hyperactive, not slowed, not withdrawn, not actively hallucinating and not combative. Thought content is not paranoid and not delusional. Cognition and memory are impaired. He does not express impulsivity or inappropriate judgment. He exhibits a depressed mood. He expresses no homicidal and no suicidal ideation. He expresses no suicidal plans and no homicidal plans. He is communicative. He exhibits abnormal recent memory. He is attentive.    Filed Vitals:   01/24/13 1644  BP: 126/80  Pulse: 72  Height: 5' 8.5" (1.74 m)  Weight: 164 lb (74.39 kg)      Labs reviewed: Basic Metabolic Panel:  Recent Labs  09/81/19  NA 133*  K 4.3  BUN 15  CREATININE 1.0  TSH 3.60   Liver Function Tests:  Recent Labs  01/08/13  AST 26  ALT 21  ALKPHOS 89   CBC:  Recent Labs  11/08/12 1611  WBC 7.2  NEUTROABS 4.7  HGB 16.0  HCT 47.4  MCV 95.4  PLT 260.0   Lipid Panel:  Recent Labs  01/08/13  CHOL 212*  HDL 43  LDLCALC 141  TRIG 147   Past Procedures:  11/08/12 CXR IMPRESSION:  No  active disease     Assessment/Plan Hyponatremia Not new, takes HCTZ last serum Na 133 01/08/13  Abnormal PFTs The patient is aware of the result.   Congestive heart failure, unspecified Compensated, takes HCTZ.   Cardiac pacemaker in situ F/u device clinic.   Atrial fibrillation Rate controlled, long term anticoagulation with Coumadin.   Gout, unspecified No flare ups. Takes Allopurinol 50mg  daily.   Hypothyroidism Takes Levothyroxine , TSH 3.597 01/08/13  HTN (hypertension) Controlled on Avapro 300mg , Metoprolol 100mg  bid  Type II or unspecified type diabetes mellitus without mention of complication, not stated as uncontrolled Diet controlled, Hgb A1c6.0 01/08/13  Urinary frequency 2-3x/night bathroom trips with urgency and leakage--plan to participate Kegel exercise.    Major depressive disorder, single episode, unspecified Difficulty falling asleep--takes Ativan almost routinely-occasional takes Ambien.   Memory change Stated he forgets peoples's names which he was used to be good at.   Pruritus Stated since he was a little kid--seeing Dermatology and takes Benadryl.   Other and unspecified hyperlipidemia LDL 141, continue diet control.     Family/ Staff Communication: observe the patient.   Goals of Care: IL  Labs/tests ordered: none

## 2013-01-24 NOTE — Assessment & Plan Note (Signed)
Controlled on Avapro 300mg, Metoprolol 100mg bid     

## 2013-01-24 NOTE — Assessment & Plan Note (Signed)
No flare ups. Takes Allopurinol 50mg daily.   

## 2013-01-24 NOTE — Assessment & Plan Note (Signed)
2-3x/night bathroom trips with urgency and leakage--plan to participate Kegel exercise.

## 2013-01-24 NOTE — Assessment & Plan Note (Signed)
Difficulty falling asleep--takes Ativan almost routinely-occasional takes Ambien.

## 2013-01-24 NOTE — Assessment & Plan Note (Signed)
Compensated, takes HCTZ.

## 2013-01-24 NOTE — Assessment & Plan Note (Signed)
F/u device clinic.

## 2013-02-11 ENCOUNTER — Ambulatory Visit (INDEPENDENT_AMBULATORY_CARE_PROVIDER_SITE_OTHER): Payer: Medicare Other | Admitting: Ophthalmology

## 2013-02-11 DIAGNOSIS — I10 Essential (primary) hypertension: Secondary | ICD-10-CM | POA: Diagnosis not present

## 2013-02-11 DIAGNOSIS — H33009 Unspecified retinal detachment with retinal break, unspecified eye: Secondary | ICD-10-CM

## 2013-02-11 DIAGNOSIS — H43819 Vitreous degeneration, unspecified eye: Secondary | ICD-10-CM

## 2013-02-11 DIAGNOSIS — H35039 Hypertensive retinopathy, unspecified eye: Secondary | ICD-10-CM

## 2013-02-15 ENCOUNTER — Ambulatory Visit (INDEPENDENT_AMBULATORY_CARE_PROVIDER_SITE_OTHER): Payer: Medicare Other | Admitting: Pharmacist Clinician (PhC)/ Clinical Pharmacy Specialist

## 2013-02-15 VITALS — BP 140/66 | HR 72

## 2013-02-15 DIAGNOSIS — I4891 Unspecified atrial fibrillation: Secondary | ICD-10-CM

## 2013-02-15 DIAGNOSIS — Z7901 Long term (current) use of anticoagulants: Secondary | ICD-10-CM

## 2013-02-18 ENCOUNTER — Encounter (HOSPITAL_COMMUNITY): Payer: Self-pay | Admitting: Emergency Medicine

## 2013-02-18 ENCOUNTER — Inpatient Hospital Stay (HOSPITAL_COMMUNITY)
Admission: EM | Admit: 2013-02-18 | Discharge: 2013-02-22 | DRG: 065 | Disposition: A | Discharge status: Skilled Nursing Facility | Payer: Medicare Other | Attending: Internal Medicine | Admitting: Internal Medicine

## 2013-02-18 ENCOUNTER — Emergency Department (HOSPITAL_COMMUNITY): Payer: Medicare Other

## 2013-02-18 DIAGNOSIS — I701 Atherosclerosis of renal artery: Secondary | ICD-10-CM

## 2013-02-18 DIAGNOSIS — I10 Essential (primary) hypertension: Secondary | ICD-10-CM

## 2013-02-18 DIAGNOSIS — Z7901 Long term (current) use of anticoagulants: Secondary | ICD-10-CM

## 2013-02-18 DIAGNOSIS — J309 Allergic rhinitis, unspecified: Secondary | ICD-10-CM

## 2013-02-18 DIAGNOSIS — E785 Hyperlipidemia, unspecified: Secondary | ICD-10-CM | POA: Diagnosis present

## 2013-02-18 DIAGNOSIS — R0989 Other specified symptoms and signs involving the circulatory and respiratory systems: Secondary | ICD-10-CM | POA: Diagnosis not present

## 2013-02-18 DIAGNOSIS — Z823 Family history of stroke: Secondary | ICD-10-CM | POA: Diagnosis not present

## 2013-02-18 DIAGNOSIS — R29818 Other symptoms and signs involving the nervous system: Secondary | ICD-10-CM | POA: Diagnosis not present

## 2013-02-18 DIAGNOSIS — Z87891 Personal history of nicotine dependence: Secondary | ICD-10-CM

## 2013-02-18 DIAGNOSIS — F411 Generalized anxiety disorder: Secondary | ICD-10-CM | POA: Diagnosis present

## 2013-02-18 DIAGNOSIS — I639 Cerebral infarction, unspecified: Secondary | ICD-10-CM

## 2013-02-18 DIAGNOSIS — Z950 Presence of cardiac pacemaker: Secondary | ICD-10-CM | POA: Diagnosis not present

## 2013-02-18 DIAGNOSIS — I2510 Atherosclerotic heart disease of native coronary artery without angina pectoris: Secondary | ICD-10-CM | POA: Diagnosis present

## 2013-02-18 DIAGNOSIS — Z7982 Long term (current) use of aspirin: Secondary | ICD-10-CM | POA: Diagnosis not present

## 2013-02-18 DIAGNOSIS — I6340 Cerebral infarction due to embolism of unspecified cerebral artery: Principal | ICD-10-CM | POA: Diagnosis present

## 2013-02-18 DIAGNOSIS — M129 Arthropathy, unspecified: Secondary | ICD-10-CM | POA: Diagnosis present

## 2013-02-18 DIAGNOSIS — R1312 Dysphagia, oropharyngeal phase: Secondary | ICD-10-CM | POA: Diagnosis not present

## 2013-02-18 DIAGNOSIS — I5022 Chronic systolic (congestive) heart failure: Secondary | ICD-10-CM | POA: Diagnosis present

## 2013-02-18 DIAGNOSIS — Z951 Presence of aortocoronary bypass graft: Secondary | ICD-10-CM | POA: Diagnosis not present

## 2013-02-18 DIAGNOSIS — I509 Heart failure, unspecified: Secondary | ICD-10-CM | POA: Diagnosis present

## 2013-02-18 DIAGNOSIS — I69959 Hemiplegia and hemiparesis following unspecified cerebrovascular disease affecting unspecified side: Secondary | ICD-10-CM | POA: Diagnosis not present

## 2013-02-18 DIAGNOSIS — E871 Hypo-osmolality and hyponatremia: Secondary | ICD-10-CM | POA: Diagnosis not present

## 2013-02-18 DIAGNOSIS — E1159 Type 2 diabetes mellitus with other circulatory complications: Secondary | ICD-10-CM | POA: Diagnosis present

## 2013-02-18 DIAGNOSIS — E119 Type 2 diabetes mellitus without complications: Secondary | ICD-10-CM | POA: Diagnosis present

## 2013-02-18 DIAGNOSIS — R942 Abnormal results of pulmonary function studies: Secondary | ICD-10-CM

## 2013-02-18 DIAGNOSIS — I4891 Unspecified atrial fibrillation: Secondary | ICD-10-CM | POA: Diagnosis present

## 2013-02-18 DIAGNOSIS — E039 Hypothyroidism, unspecified: Secondary | ICD-10-CM

## 2013-02-18 DIAGNOSIS — Z9861 Coronary angioplasty status: Secondary | ICD-10-CM

## 2013-02-18 DIAGNOSIS — R488 Other symbolic dysfunctions: Secondary | ICD-10-CM | POA: Diagnosis not present

## 2013-02-18 DIAGNOSIS — R471 Dysarthria and anarthria: Secondary | ICD-10-CM | POA: Diagnosis present

## 2013-02-18 DIAGNOSIS — I719 Aortic aneurysm of unspecified site, without rupture: Secondary | ICD-10-CM

## 2013-02-18 DIAGNOSIS — I5023 Acute on chronic systolic (congestive) heart failure: Secondary | ICD-10-CM | POA: Diagnosis present

## 2013-02-18 DIAGNOSIS — M6281 Muscle weakness (generalized): Secondary | ICD-10-CM | POA: Diagnosis not present

## 2013-02-18 DIAGNOSIS — I359 Nonrheumatic aortic valve disorder, unspecified: Secondary | ICD-10-CM | POA: Diagnosis not present

## 2013-02-18 DIAGNOSIS — R269 Unspecified abnormalities of gait and mobility: Secondary | ICD-10-CM | POA: Diagnosis not present

## 2013-02-18 DIAGNOSIS — I6350 Cerebral infarction due to unspecified occlusion or stenosis of unspecified cerebral artery: Secondary | ICD-10-CM | POA: Diagnosis not present

## 2013-02-18 HISTORY — DX: Cerebral infarction, unspecified: I63.9

## 2013-02-18 LAB — COMPREHENSIVE METABOLIC PANEL
ALT: 16 U/L (ref 0–53)
AST: 23 U/L (ref 0–37)
Albumin: 3.6 g/dL (ref 3.5–5.2)
Alkaline Phosphatase: 90 U/L (ref 39–117)
BUN: 19 mg/dL (ref 6–23)
CO2: 26 mEq/L (ref 19–32)
Chloride: 91 mEq/L — ABNORMAL LOW (ref 96–112)
Creatinine, Ser: 1.12 mg/dL (ref 0.50–1.35)
GFR calc non Af Amer: 56 mL/min — ABNORMAL LOW (ref >90)
Potassium: 3.5 mEq/L (ref 3.5–5.1)
Sodium: 128 mEq/L — ABNORMAL LOW (ref 135–145)
Total Bilirubin: 0.7 mg/dL (ref 0.3–1.2)
Total Protein: 6.7 g/dL (ref 6.0–8.3)

## 2013-02-18 LAB — URINALYSIS, ROUTINE W REFLEX MICROSCOPIC
Bilirubin Urine: NEGATIVE
Leukocytes, UA: NEGATIVE
Protein, ur: NEGATIVE mg/dL
Specific Gravity, Urine: 1.01 (ref 1.005–1.030)
Urobilinogen, UA: 0.2 mg/dL (ref 0.0–1.0)

## 2013-02-18 LAB — CBC
HCT: 43.1 % (ref 39.0–52.0)
Hemoglobin: 15.5 g/dL (ref 13.0–17.0)
RBC: 4.67 MIL/uL (ref 4.22–5.81)
WBC: 6.9 10*3/uL (ref 4.0–10.5)

## 2013-02-18 LAB — POCT I-STAT, CHEM 8
Creatinine, Ser: 1.2 mg/dL (ref 0.50–1.35)
Glucose, Bld: 127 mg/dL — ABNORMAL HIGH (ref 70–99)
HCT: 48 % (ref 39.0–52.0)
Hemoglobin: 16.3 g/dL (ref 13.0–17.0)
Potassium: 3.4 mEq/L — ABNORMAL LOW (ref 3.5–5.1)
Sodium: 129 mEq/L — ABNORMAL LOW (ref 135–145)
TCO2: 26 mmol/L (ref 0–100)

## 2013-02-18 LAB — DIFFERENTIAL
Basophils Relative: 0 % (ref 0–1)
Eosinophils Absolute: 0.2 10*3/uL (ref 0.0–0.7)
Lymphocytes Relative: 31 % (ref 12–46)
Monocytes Absolute: 0.9 10*3/uL (ref 0.1–1.0)
Monocytes Relative: 13 % — ABNORMAL HIGH (ref 3–12)
Neutro Abs: 3.7 10*3/uL (ref 1.7–7.7)
Neutrophils Relative %: 54 % (ref 43–77)

## 2013-02-18 LAB — GLUCOSE, CAPILLARY: Glucose-Capillary: 110 mg/dL — ABNORMAL HIGH (ref 70–99)

## 2013-02-18 LAB — URINE MICROSCOPIC-ADD ON

## 2013-02-18 LAB — POCT I-STAT TROPONIN I: Troponin i, poc: 0.01 ng/mL (ref 0.00–0.08)

## 2013-02-18 LAB — TROPONIN I: Troponin I: <0.3 ng/mL (ref <0.30)

## 2013-02-18 LAB — RAPID URINE DRUG SCREEN, HOSP PERFORMED
Barbiturates: NOT DETECTED
Cocaine: NOT DETECTED
Opiates: NOT DETECTED
Tetrahydrocannabinol: NOT DETECTED

## 2013-02-18 LAB — APTT: aPTT: 42 seconds — ABNORMAL HIGH (ref 24–37)

## 2013-02-18 NOTE — ED Notes (Signed)
To CT via wheelchair

## 2013-02-18 NOTE — Consult Note (Addendum)
Neurology Consultation Reason for Consult: Dysarthria Referring Physician: Abran Duke  CC: Dysarthria  History is obtained from: Patient, daughter  HPI: Jon [name] is Jon 79 y.o. male with Jon history of atrial fibrillation who is on Coumadin who presents with right-sided incoordination and dysarthria that started around 3 PM. He states that he is hoping things or any get better, but instead they got worse and therefore he is now seeking medical attention. He has not missed any doses of his Coumadin. His daughter confirms that the right-sided discoordination as well as the slurred speech are new.   LKW: 3 PM tpa given?: no, outside of what    ROS: Jon 14 point ROS was performed and is negative except as noted in the HPI.  Past Medical History  Diagnosis Date  . Hypertension   . Coronary artery disease   . Arthritis   . Cataract   . Anxiety   . Blood transfusion without reported diagnosis   . Hyperlipidemia   . Heart murmur   . Allergy   . Thyroid disease   . PAF (paroxysmal atrial fibrillation)     coumadin  . History of abdominal aortic aneurysm   . History of echocardiogram 12/2008    EF >55%; mild concentric LVH; mild MR; mild-mod TR; mild AV regurg;   . History of nuclear stress test 12/2010    lexiscan; low risk; compared to prior study, perfusion improved  . History of Doppler ultrasound 05/22/2012    LEAs; R anterior tibial artery appeared occluded; L posterior tibial shows short segment of occlusive ds  . History of Doppler ultrasound 05/22/2012    Abdominal Aortic Doppler; slight increase in fusiform aneurysm     Family History: Mother-stroke  Social History: Tob: None  Exam: Current vital signs: BP 165/81  Pulse 61  Temp(Src) 97.7 F (36.5 C) (Oral)  Resp 11  Ht 5\' 9"  (1.753 m)  Wt 82.464 kg (181 lb 12.8 oz)  BMI 26.83 kg/m2  SpO2 96% Vital signs in last 24 hours: Temp:  [97.4 F (36.3 C)-97.7 F (36.5 C)] 97.7 F (36.5 C) (12/15 2030) Pulse Rate:   [61-63] 61 (12/15 2100) Resp:  [11-22] 11 (12/15 2100) BP: (165-188)/(73-115) 165/81 mmHg (12/15 2100) SpO2:  [96 %-98 %] 96 % (12/15 2100) Weight:  [82.464 kg (181 lb 12.8 oz)] 82.464 kg (181 lb 12.8 oz) (12/15 2008)  General: In bed, NAD CV: Irregular Mental Status: Patient is awake, alert, oriented to person, place, month, year, and situation. Immediate and remote memory are intact. Patient is able to give Jon clear and coherent history. No signs of aphasia or neglect He is dysarthric Cranial Nerves: II: Visual Fields are full. Pupils are equal, round, and reactive to light.  Discs are difficult to visualize. III,IV, VI: EOMI without ptosis or diploplia.  V: Facial sensation is symmetric to temperature VII: Facial movement is notable for lag in right smile VIII: hearing is intact to voice X: Uvula elevates symmetrically XI: Shoulder shrug is symmetric. XII: tongue is midline without atrophy or fasciculations.  Motor: Tone is normal. Bulk is normal. 5/5 strength was present in all four extremities.  Sensory: Sensation is symmetric to light touch and temperature in the arms and legs. Deep Tendon Reflexes: 2+ and symmetric in the biceps and patellae.  Cerebellar: FNF and HKS are intact on left, dysmetria on right Gait: Not tested due to patient safety         I have reviewed labs in epic and  the results pertinent to this consultation are: INR-2.02 Chem-8-mild hyponatremia CBC-unremarkable  I have reviewed the images obtained: CT head-no clear acute finding  Impression: 79 year old male with dysarthria and right-sided dysmetria. This likely represents Jon small subcortical infarct, possibly cerebellar. Given that cerebellar lesions can sometimes be larger than seen on exam, I would favor holding off on continuing anticoagulation for now  Recommendations: 1. HgbA1c, fasting lipid panel 2. repeat head CT in 24-48 hours 3. Frequent neuro checks 4. Echocardiogram 5. CT  angiogram head and neck, therefore no need for Dopplers 6. Prophylactic therapy-ASA-325 mg 7. Risk factor modification 8. Telemetry monitoring 9. PT consult, OT consult, Speech consult    Ritta Slot, MD [facility] [phone]  If 7pm- 7am, please page neurology on call at [phone].

## 2013-02-18 NOTE — ED Notes (Signed)
Transporting patient to new room assignment. 

## 2013-02-18 NOTE — ED Notes (Signed)
Collected Urine

## 2013-02-18 NOTE — ED Notes (Signed)
Patient presents with stroke like symptoms  Speech slurred at times, right side facial droop

## 2013-02-18 NOTE — ED Notes (Signed)
Neurologist at bedside. 

## 2013-02-19 ENCOUNTER — Inpatient Hospital Stay (HOSPITAL_COMMUNITY): Payer: Medicare Other

## 2013-02-19 ENCOUNTER — Encounter (HOSPITAL_COMMUNITY): Payer: Self-pay | Admitting: Internal Medicine

## 2013-02-19 DIAGNOSIS — J309 Allergic rhinitis, unspecified: Secondary | ICD-10-CM

## 2013-02-19 DIAGNOSIS — I10 Essential (primary) hypertension: Secondary | ICD-10-CM | POA: Diagnosis not present

## 2013-02-19 DIAGNOSIS — I701 Atherosclerosis of renal artery: Secondary | ICD-10-CM

## 2013-02-19 DIAGNOSIS — E871 Hypo-osmolality and hyponatremia: Secondary | ICD-10-CM | POA: Diagnosis not present

## 2013-02-19 DIAGNOSIS — R942 Abnormal results of pulmonary function studies: Secondary | ICD-10-CM

## 2013-02-19 DIAGNOSIS — R0989 Other specified symptoms and signs involving the circulatory and respiratory systems: Secondary | ICD-10-CM | POA: Diagnosis not present

## 2013-02-19 DIAGNOSIS — I4891 Unspecified atrial fibrillation: Secondary | ICD-10-CM

## 2013-02-19 DIAGNOSIS — I6350 Cerebral infarction due to unspecified occlusion or stenosis of unspecified cerebral artery: Secondary | ICD-10-CM | POA: Diagnosis not present

## 2013-02-19 LAB — PROTIME-INR
INR: 1.98 — ABNORMAL HIGH (ref 0.00–1.49)
Prothrombin Time: 21.9 seconds — ABNORMAL HIGH (ref 11.6–15.2)

## 2013-02-19 LAB — HEMOGLOBIN A1C
Hgb A1c MFr Bld: 6 % — ABNORMAL HIGH (ref <5.7)
Mean Plasma Glucose: 126 mg/dL — ABNORMAL HIGH (ref <117)

## 2013-02-19 LAB — CREATININE, URINE, RANDOM: Creatinine, Urine: 19.81 mg/dL

## 2013-02-19 LAB — SODIUM, URINE, RANDOM: Sodium, Ur: 78 mEq/L

## 2013-02-19 LAB — LIPID PANEL
Cholesterol: 187 mg/dL (ref 0–200)
HDL: 34 mg/dL — ABNORMAL LOW (ref >39)
LDL Cholesterol: 117 mg/dL — ABNORMAL HIGH (ref 0–99)

## 2013-02-19 MED ORDER — ASPIRIN EC 81 MG PO TBEC: 81.0000 mg | DELAYED_RELEASE_TABLET | Freq: Every day | ORAL | Status: DC

## 2013-02-19 MED ORDER — WARFARIN SODIUM 2.5 MG PO TABS
2.5000 mg | ORAL_TABLET | Freq: Once | ORAL | Status: DC
  Filled 2013-02-19: qty 1

## 2013-02-19 MED ORDER — SENNOSIDES-DOCUSATE SODIUM 8.6-50 MG PO TABS: 1.0000 | ORAL_TABLET | Freq: Every evening | ORAL | Status: DC | PRN

## 2013-02-19 MED ORDER — POTASSIUM CHLORIDE CRYS ER 10 MEQ PO TBCR
10.0000 meq | EXTENDED_RELEASE_TABLET | Freq: Two times a day (BID) | ORAL | Status: DC
  Administered 2013-02-19 – 2013-02-22 (×7): 10 meq via ORAL
  Filled 2013-02-19 (×8): qty 1

## 2013-02-19 MED ORDER — LEVOTHYROXINE SODIUM 75 MCG PO TABS
75.0000 ug | ORAL_TABLET | Freq: Every day | ORAL | Status: DC
  Administered 2013-02-20 – 2013-02-22 (×3): 75 ug via ORAL
  Filled 2013-02-19 (×6): qty 1

## 2013-02-19 MED ORDER — APIXABAN 2.5 MG PO TABS
2.5000 mg | ORAL_TABLET | Freq: Two times a day (BID) | ORAL | Status: DC
  Administered 2013-02-19 – 2013-02-22 (×6): 2.5 mg via ORAL
  Filled 2013-02-19 (×7): qty 1

## 2013-02-19 MED ORDER — ACETAMINOPHEN 650 MG RE SUPP: 650.0000 mg | RECTAL | Status: DC | PRN

## 2013-02-19 MED ORDER — LORAZEPAM 0.5 MG PO TABS
0.5000 mg | ORAL_TABLET | Freq: Every evening | ORAL | Status: DC | PRN
  Administered 2013-02-19 – 2013-02-21 (×3): 0.5 mg via ORAL
  Filled 2013-02-19 (×3): qty 1

## 2013-02-19 MED ORDER — ACETAMINOPHEN 325 MG PO TABS
650.0000 mg | ORAL_TABLET | ORAL | Status: DC | PRN
  Administered 2013-02-20: 650 mg via ORAL
  Filled 2013-02-19: qty 2

## 2013-02-19 MED ORDER — SODIUM CHLORIDE 0.9 % IV SOLN
INTRAVENOUS | Status: DC
  Administered 2013-02-19: 02:00:00 via INTRAVENOUS

## 2013-02-19 MED ORDER — IRBESARTAN 300 MG PO TABS
300.0000 mg | ORAL_TABLET | Freq: Every day | ORAL | Status: DC
Start: 2013-02-19 — End: 2013-02-22
  Administered 2013-02-19 – 2013-02-21 (×3): 300 mg via ORAL
  Filled 2013-02-19 (×5): qty 1

## 2013-02-19 MED ORDER — IOHEXOL 350 MG/ML SOLN
80.0000 mL | Freq: Once | INTRAVENOUS | Status: AC | PRN
  Administered 2013-02-19: 80 mL via INTRAVENOUS

## 2013-02-19 MED ORDER — METOPROLOL TARTRATE 100 MG PO TABS
100.0000 mg | ORAL_TABLET | Freq: Two times a day (BID) | ORAL | Status: DC
  Administered 2013-02-19 – 2013-02-22 (×7): 100 mg via ORAL
  Filled 2013-02-19 (×9): qty 1

## 2013-02-19 MED ORDER — WARFARIN - PHARMACIST DOSING INPATIENT: Freq: Every day | Status: DC

## 2013-02-19 MED ORDER — ALLOPURINOL 100 MG PO TABS
50.0000 mg | ORAL_TABLET | Freq: Every morning | ORAL | Status: DC
Start: 2013-02-19 — End: 2013-02-22
  Administered 2013-02-19 – 2013-02-22 (×4): 50 mg via ORAL
  Filled 2013-02-19 (×5): qty 0.5

## 2013-02-19 MED ORDER — ASPIRIN 300 MG RE SUPP
300.0000 mg | Freq: Every day | RECTAL | Status: DC
  Filled 2013-02-19 (×2): qty 1

## 2013-02-19 MED ORDER — ASPIRIN 325 MG PO TABS
325.0000 mg | ORAL_TABLET | Freq: Every day | ORAL | Status: DC
  Administered 2013-02-19 – 2013-02-20 (×2): 325 mg via ORAL
  Filled 2013-02-19 (×2): qty 1

## 2013-02-19 MED ORDER — ZOLPIDEM TARTRATE 5 MG PO TABS: 2.5000 mg | ORAL_TABLET | Freq: Every evening | ORAL | Status: DC | PRN

## 2013-02-19 MED ORDER — SODIUM CHLORIDE 0.9 % IV BOLUS (SEPSIS)
500.0000 mL | Freq: Once | INTRAVENOUS | Status: AC
  Administered 2013-02-19: 15:00:00 via INTRAVENOUS

## 2013-02-19 NOTE — Progress Notes (Signed)
Physical Therapy Evaluation Patient Details Name: Jon [name] MRN: [id number]   Attempting therapy evaluation patient reports speech and facial expression has worsened compared to this am. Nsg notified, doctor notified by nursing, reaching out to speech pathologist for baseline. Will continue to follow for evaluation.

## 2013-02-19 NOTE — Progress Notes (Signed)
   CARE MANAGEMENT NOTE 02/19/2013  Patient:  ALMUS, WOODHAM   Account Number:  [id number]  Date Initiated:  02/19/2013  Documentation initiated by:  Jiles Crocker  Subjective/Objective Assessment:   ADMITTED WITH CVA     Action/Plan:   CM FOLLOWING FOR DCP   Anticipated DC Date:  02/23/2013   Anticipated DC Plan:  AWAITING ON PT/OT EVALS FOR DISPOSITION     DC Planning Services  CM consult          Status of service:  In process, will continue to follow Medicare Important Message given?  NA - LOS <3 / Initial given by admissions (If response is "NO", the following Medicare IM given date fields will be blank)  Per UR Regulation:  Reviewed for med. necessity/level of care/duration of stay  Comments:  12/16/2014Abelino Derrick RN,BSN,MHA [phone]

## 2013-02-19 NOTE — Progress Notes (Signed)
[facility] PROGRESS NOTE  Jon [name] ZOX:[id number] DOB: 01/19/1922 DOA: 02/18/2013 PCP: Kimber Relic, MD  Assessment/Plan: CVA  -Neurology consulted - unable to obtain MRA/MRI DUE to pacemaker,  - Echo pending - PT/OT evaluation.  - On coumadin - CTA head and neck pending  Atrial fibrillation - continue Coumadin currently rate controlled    Congestive heart failure, unspecified - stable, remains euvolemic    HTN (hypertension) - continue home medication    Hyponatremia - obtain urine electrolytes this is chronic and stable    Coronary atherosclerosis of unspecified type of vessel, native or graft - currently no chest pain continue home medications   Type II or unspecified type diabetes mellitus without mention of complication, not stated as uncontrolled - sensitive scale hold by mouth meds  Code Status: Full Family Communication: Pt in room (indicate person spoken with, relationship, and if by phone, the number) Disposition Plan: Pending  Consultants:  neurology  HPI/Subjective: No acute events noted.  Objective: Filed Vitals:   02/19/13 0100 02/19/13 0204 02/19/13 0437 02/19/13 0635  BP: 179/61 184/59 180/62 175/57  Pulse: 59 60 59 63  Temp:  97.2 F (36.2 C) 97.2 F (36.2 C) 97.8 F (36.6 C)  TempSrc:  Oral Oral Oral  Resp: 20 18 20 18   Height:  5\' 8"  (1.727 m)    Weight:  72.712 kg (160 lb 4.8 oz)    SpO2: 97% 99% 93% 96%    Intake/Output Summary (Last 24 hours) at 02/19/13 0810 Last data filed at 02/19/13 0131  Gross per 24 hour  Intake      0 ml  Output    500 ml  Net   -500 ml   Filed Weights   02/18/13 2008 02/19/13 0204  Weight: 82.464 kg (181 lb 12.8 oz) 72.712 kg (160 lb 4.8 oz)    Exam:  General:  Awake,in nad  Cardiovascular: regular, s1,s2  Respiratory: norma resp effort, no wheezing  Abdomen: soft, nondistended  Musculoskeletal: perfused, no clubbing   Data Reviewed: Basic Metabolic Panel:  Recent  Labs Lab 02/18/13 2025 02/18/13 2032  NA 128* 129*  K 3.5 3.4*  CL 91* 92*  CO2 26  --   GLUCOSE 121* 127*  BUN 19 19  CREATININE 1.12 1.20  CALCIUM 9.0  --    Liver Function Tests:  Recent Labs Lab 02/18/13 2025  AST 23  ALT 16  ALKPHOS 90  BILITOT 0.7  PROT 6.7  ALBUMIN 3.6   No results found for this basename: LIPASE, AMYLASE,  in the last 168 hours No results found for this basename: AMMONIA,  in the last 168 hours CBC:  Recent Labs Lab 02/18/13 2025 02/18/13 2032  WBC 6.9  --   NEUTROABS 3.7  --   HGB 15.5 16.3  HCT 43.1 48.0  MCV 92.3  --   PLT 217  --    Cardiac Enzymes:  Recent Labs Lab 02/18/13 2025  TROPONINI <0.30   BNP (last 3 results) No results found for this basename: PROBNP,  in the last 8760 hours CBG:  Recent Labs Lab 02/18/13 2043  GLUCAP 110*    No results found for this or any previous visit (from the past 240 hour(s)).   Studies: Ct Head (brain) Wo Contrast  02/18/2013   CLINICAL DATA:  Last seen normal at 3 p.m., right-sided weakness, right facial droop, slurred speech, history hypertension, coronary artery disease  EXAM: CT HEAD WITHOUT CONTRAST  TECHNIQUE: Contiguous axial images were  obtained from the base of the skull through the vertex without intravenous contrast.  COMPARISON:  03/04/2011  FINDINGS: Generalized atrophy.  Normal ventricular morphology.  No midline shift or mass effect.  Small vessel chronic ischemic changes of deep cerebral white matter.  Small focus of low attenuation identified right thalamus likely representing a small lacunar infarct, question slightly larger than on previous exam.  No intracranial hemorrhage, mass lesion or evidence of acute cortical infarction.  No extra-axial fluid collections.  Atherosclerotic calcifications of internal carotid arteries and vertebral arteries at skullbase.  No acute bone or sinus abnormalities.  IMPRESSION: Atrophy with small vessel chronic ischemic changes of deep  cerebral white matter.  Small lacunar infarct at right thalamus, question slightly larger than on previous exam.  No evidence of cortical infarction or intracranial hemorrhage.  Critical Value/emergent results were called by telephone at the time of interpretation on 02/18/2013 at 8:34 PM to Dr. Amada Jupiter, who verbally acknowledged these results.   Electronically Signed   By: Ulyses Southward M.D.   On: 02/18/2013 20:34    Scheduled Meds: . allopurinol  50 mg Oral q morning - 10a  . aspirin  300 mg Rectal Daily   Or  . aspirin  325 mg Oral Daily  . irbesartan  300 mg Oral QHS  . levothyroxine  75 mcg Oral QAC breakfast  . metoprolol  100 mg Oral BID  . potassium chloride  10 mEq Oral BID  . Warfarin - Pharmacist Dosing Inpatient   Does not apply q1800   Continuous Infusions: . sodium chloride 75 mL/hr at 02/19/13 0212    Active Problems:   Hyponatremia   Atrial fibrillation   HTN (hypertension)   Congestive heart failure, unspecified   Type II or unspecified type diabetes mellitus without mention of complication, not stated as uncontrolled   Coronary atherosclerosis of unspecified type of vessel, native or graft   CVA (cerebral infarction)  Time spent:  CHIU, STEPHEN K  [facility] Pager [phone]. If 7PM-7AM, please contact night-coverage at [email or web address], password [facility] 02/19/2013, 8:10 AM  LOS: 1 day

## 2013-02-19 NOTE — Plan of Care (Signed)
Dr Clydie Braun schooler is in the room assessing pt.

## 2013-02-19 NOTE — Progress Notes (Signed)
ANTICOAGULATION CONSULT NOTE - Initial Consult  Pharmacy Consult for Apixaban Indication: atrial fibrillation  Allergies  Allergen Reactions  . Caduet [Amlodipine-Atorvastatin]     Weakness   . Crestor [Rosuvastatin]   . Cymbalta [Duloxetine Hcl] Other (See Comments)    Excessive sedation  . Lipitor [Atorvastatin Calcium]     Muscle pain  . Lisinopril   . Simvastatin     Muscle pain    Patient Measurements: Height: 5\' 8"  (172.7 cm) Weight: 160 lb 4.8 oz (72.712 kg) IBW/kg (Calculated) : 68.4  Vital Signs: Temp: 97.6 F (36.4 C) (12/16 1400) Temp src: Oral (12/16 1400) BP: 170/64 mmHg (12/16 1400) Pulse Rate: 55 (12/16 1400)  Labs:  Recent Labs  02/18/13 2025 02/18/13 2032 02/19/13 0355  HGB 15.5 16.3  --   HCT 43.1 48.0  --   PLT 217  --   --   APTT 42*  --   --   LABPROT 22.2*  --  21.9*  INR 2.02*  --  1.98*  CREATININE 1.12 1.20  --   TROPONINI <0.30  --   --     Estimated Creatinine Clearance: 40.4 ml/min (by C-G formula based on Cr of 1.2).   Medical History: Past Medical History  Diagnosis Date  . Hypertension   . Coronary artery disease   . Arthritis   . Cataract   . Anxiety   . Blood transfusion without reported diagnosis   . Hyperlipidemia   . Heart murmur   . Allergy   . Thyroid disease   . PAF (paroxysmal atrial fibrillation)     coumadin  . History of abdominal aortic aneurysm   . History of echocardiogram 12/2008    EF >55%; mild concentric LVH; mild MR; mild-mod TR; mild AV regurg;   . History of nuclear stress test 12/2010    lexiscan; low risk; compared to prior study, perfusion improved  . History of Doppler ultrasound 05/22/2012    LEAs; R anterior tibial artery appeared occluded; L posterior tibial shows short segment of occlusive ds  . History of Doppler ultrasound 05/22/2012    Abdominal Aortic Doppler; slight increase in fusiform aneurysm     Medications:  Scheduled:  . allopurinol  50 mg Oral q morning - 10a  .  aspirin  300 mg Rectal Daily   Or  . aspirin  325 mg Oral Daily  . irbesartan  300 mg Oral QHS  . levothyroxine  75 mcg Oral QAC breakfast  . metoprolol  100 mg Oral BID  . potassium chloride  10 mEq Oral BID  . sodium chloride  500 mL Intravenous Once  . warfarin  2.5 mg Oral ONCE-1800  . Warfarin - Pharmacist Dosing Inpatient   Does not apply q1800    Assessment: 44 yoM with PMH of HTN, CAD, arthritis, CHF (EF>55%, 2010), PAF on coum, s/p CABG s/p PCI, hx AAA, T2DM, presents to ED with RLE weakness during family dinner. Code stroke called. Not a candidate for TPA  On coumadin PTA, pharmacy consulted to start apixaban 2.5mg  BID (per Dr. Marge Duncans note) INR is currently 1.98, last dose 12/14. Will d/c coumadin and start apixaban tonight to avoid INR > 2  Goal of Therapy:   INR 2-3 Monitor platelets by anticoagulation protocol: Yes   Plan:  Start apixaban 2.5 mg BID Monitor Renal function and signs of bleeding  Suriah Peragine B. Artelia Laroche, PharmD Clinical Pharmacist - Resident Pager: [phone] Phone: [phone] 02/19/2013 3:02 PM

## 2013-02-19 NOTE — Evaluation (Signed)
Physical Therapy Evaluation Patient Details Name: Jon [name] MRN: [id number] DOB: 1921-09-11 Today's Date: 02/19/2013 Time: 1410-1453 PT Time Calculation (min): 43 min  PT Assessment / Plan / Recommendation History of Present Illness   Jon [name] is a 79 y.o. male with a history of atrial fibrillation who is on Coumadin who presents with right-sided incoordination and dysarthria that started around 3 PM. He states that he is hoping things or any get better, but instead they got worse and therefore he is now seeking medical attention. He has not missed any doses of his Coumadin. His daughter confirms that the right-sided discoordination as well as the slurred speech are new.  Clinical Impression  Patient demonstrates deficits in functional mobility as indicated below. Patient will benefit from continued skilled PT to address deficits and maximize function. Will continue to see as indicated and progress activity as tolerated. Recommend HHPT for balance upon discharge.  See nsg notes for events related to worsening of patients symptoms during session. Pt assisted back to bed per MD orders for bedrest.     PT Assessment  Patient needs continued PT services    Follow Up Recommendations  Home health PT          Equipment Recommendations  None recommended by PT       Frequency Min 4X/week    Precautions / Restrictions     Pertinent Vitals/Pain No pain at this time      Mobility  Bed Mobility Bed Mobility: Supine to Sit;Sitting - Scoot to Edge of Bed;Sit to Supine Supine to Sit: 5: Supervision Sitting - Scoot to Edge of Bed: 5: Supervision Sit to Supine: 5: Supervision Transfers Transfers: Sit to Stand;Stand to Sit Sit to Stand: 4: Min assist Stand to Sit: 4: Min assist Ambulation/Gait Ambulation/Gait Assistance: 4: Min Environmental consultant (Feet): 16 Feet Assistive device: 1 person hand held assist Ambulation/Gait Assistance Details: instability noted, will  further assess next session    Exercises     PT Diagnosis: Difficulty walking;Abnormality of gait  PT Problem List: Decreased balance;Decreased mobility;Decreased coordination PT Treatment Interventions: DME instruction;Gait training;Functional mobility training;Therapeutic activities;Therapeutic exercise;Balance training;Patient/family education     PT Goals(Current goals can be found in the care plan section) Acute Rehab PT Goals Patient Stated Goal: to be able to get moving PT Goal Formulation: With patient Time For Goal Achievement: 03/05/13 Potential to Achieve Goals: Good  Visit Information  Last PT Received On: 02/19/13 Assistance Needed: +1 History of Present Illness:  Jon [name] is a 79 y.o. male with a history of atrial fibrillation who is on Coumadin who presents with right-sided incoordination and dysarthria that started around 3 PM. He states that he is hoping things or any get better, but instead they got worse and therefore he is now seeking medical attention. He has not missed any doses of his Coumadin. His daughter confirms that the right-sided discoordination as well as the slurred speech are new.       Prior Functioning  Home Living Family/patient expects to be discharged to:: Other (Comment) (Independent Living - [facility]) Living Arrangements: Spouse/significant other Available Help at Discharge: Family;Available PRN/intermittently Type of Home: Independent living facility Home Access: Level entry Home Layout: One level Home Equipment: Walker - 4 wheels;Cane - quad;Bedside commode;Grab bars - toilet;Grab bars - tub/shower  Lives With: Spouse Prior Function Level of Independence: Independent with assistive device(s) Comments: usually cane for community, rollator in ILF Communication Communication:  (slurred speech) Dominant Hand: Right  Cognition  Cognition Arousal/Alertness: Awake/alert Behavior During Therapy: WFL for tasks  assessed/performed Overall Cognitive Status: Within Functional Limits for tasks assessed    Extremity/Trunk Assessment Upper Extremity Assessment Upper Extremity Assessment: Overall WFL for tasks assessed Lower Extremity Assessment Lower Extremity Assessment: Overall WFL for tasks assessed      End of Session PT - End of Session Equipment Utilized During Treatment: Gait belt Activity Tolerance: Patient tolerated treatment well Patient left: in bed;with call bell/phone within reach;with family/visitor present;Other (comment) (flat bedrest post evaluation) Nurse Communication: Mobility status;Other (comment) (change in status, worsening of speech)  GP     Fabio Asa 02/19/2013, 4:21 PM Charlotte Crumb, PT DPT  [phone]

## 2013-02-19 NOTE — H&P (Signed)
PCP: Kimber Relic, MD  Cardiology: [facility]  Chief Complaint:  Right leg weakness  HPI: Jon [name] is a 79 y.o. male   has a past medical history of Hypertension; Coronary artery disease; Arthritis; Cataract; Anxiety; Blood transfusion without reported diagnosis; Hyperlipidemia; Heart murmur; Allergy; Thyroid disease; PAF (paroxysmal atrial fibrillation); History of abdominal aortic aneurysm; History of echocardiogram (12/2008); History of nuclear stress test (12/2010); History of Doppler ultrasound (05/22/2012); and History of Doppler ultrasound (05/22/2012).   Presented with  Around 3 Pm he felt weak with some trouble moving right leg. His family wanted to take him out to dinner and during that time he started to have slurred speech. He was brought to ER and was found to have small right thalamic CVA on CT scan Patient is on coumadin for hx of A.fib. He was not a candidate for tPA. Hospitaltist called for admission   Review of Systems:    Pertinent positives include: fatigue, shortness of breath at rest. Weakness,  slurred speech,  Constitutional:  No weight loss, night sweats, Fevers, chills, weight loss  HEENT:  No headaches, Difficulty swallowing,Tooth/dental problems,Sore throat,  No sneezing, itching, ear ache, nasal congestion, post nasal drip,  Cardio-vascular:  No chest pain, Orthopnea, PND, anasarca, dizziness, palpitations.no Bilateral lower extremity swelling  GI:  No heartburn, indigestion, abdominal pain, nausea, vomiting, diarrhea, change in bowel habits, loss of appetite, melena, blood in stool, hematemesis Resp:  no No dyspnea on exertion, No excess mucus, no productive cough, No non-productive cough, No coughing up of blood.No change in color of mucus. No wheezing. Skin:  no rash or lesions. No jaundice GU:  no dysuria, change in color of urine, no urgency or frequency. No straining to urinate.  No flank pain.  Musculoskeletal:  No joint pain or no joint  swelling. No decreased range of motion. No back pain.  Psych:  No change in mood or affect. No depression or anxiety. No memory loss.  Neuro: no localizing neurological complaints, no tingling,    no double vision, no gait abnormality, no no confusion  Otherwise ROS are negative except for above, 10 systems were reviewed  Past Medical History: Past Medical History  Diagnosis Date  . Hypertension   . Coronary artery disease   . Arthritis   . Cataract   . Anxiety   . Blood transfusion without reported diagnosis   . Hyperlipidemia   . Heart murmur   . Allergy   . Thyroid disease   . PAF (paroxysmal atrial fibrillation)     coumadin  . History of abdominal aortic aneurysm   . History of echocardiogram 12/2008    EF >55%; mild concentric LVH; mild MR; mild-mod TR; mild AV regurg;   . History of nuclear stress test 12/2010    lexiscan; low risk; compared to prior study, perfusion improved  . History of Doppler ultrasound 05/22/2012    LEAs; R anterior tibial artery appeared occluded; L posterior tibial shows short segment of occlusive ds  . History of Doppler ultrasound 05/22/2012    Abdominal Aortic Doppler; slight increase in fusiform aneurysm    Past Surgical History  Procedure Laterality Date  . Coronary artery bypass graft  1989  . Pacemaker insertion  2011  . Insert / replace / remove pacemaker    . Eye surgery    . Joint replacement    . Hernia repair    . Coronary angioplasty with stent placement  2008    stent to SVG to OM  Medications: Prior to Admission medications   Medication Sig Start Date End Date Taking? Authorizing Provider  allopurinol (ZYLOPRIM) 100 MG tablet Take 50 mg by mouth every morning.   Yes Historical Provider, MD  amoxicillin (AMOXIL) 500 MG tablet Take 500 mg by mouth. Take four tablets one hour prior to dental procedures.   Yes Historical Provider, MD  aspirin EC 81 MG tablet Take 81 mg by mouth daily.     Yes Historical Provider, MD   cetaphil (CETAPHIL) lotion Apply 1 application topically daily as needed. Compounded with triamcinolone cream 1% (1:3 ratio).  For rash    Yes Historical Provider, MD  Cholecalciferol (VITAMIN D) 400 UNITS capsule Take 400 Units by mouth 2 (two) times daily.    Yes Historical Provider, MD  diphenhydrAMINE (BENADRYL) 25 MG tablet Take 25 mg by mouth every 6 (six) hours as needed for itching.   Yes Historical Provider, MD  hydrochlorothiazide (HYDRODIURIL) 25 MG tablet Take 25 mg by mouth daily.   Yes Historical Provider, MD  irbesartan (AVAPRO) 300 MG tablet Take 300 mg by mouth at bedtime. Take one tablet daily for blood pressure   Yes Historical Provider, MD  levothyroxine (SYNTHROID, LEVOTHROID) 75 MCG tablet Take 75 mcg by mouth daily before breakfast.   Yes Historical Provider, MD  LORazepam (ATIVAN) 0.5 MG tablet Take 0.5 mg by mouth at bedtime as needed for anxiety or sleep.   Yes Historical Provider, MD  metoprolol (LOPRESSOR) 100 MG tablet Take 100 mg by mouth 2 (two) times daily.    Yes Historical Provider, MD  nitroGLYCERIN (NITROSTAT) 0.4 MG SL tablet Place 0.4 mg under the tongue every 5 (five) minutes as needed. For chest pain    Yes Historical Provider, MD  potassium chloride (MICRO-K) 10 MEQ CR capsule Take 10 mEq by mouth 2 (two) times daily.   Yes Historical Provider, MD  warfarin (COUMADIN) 2.5 MG tablet Take 2.5 mg by mouth daily.  09/12/12  Yes Jon [name], RPH-CPP  zolpidem (AMBIEN) 10 MG tablet Take 2.5-5 mg by mouth at bedtime as needed for sleep.   Yes Historical Provider, MD    Allergies:   Allergies  Allergen Reactions  . Caduet [Amlodipine-Atorvastatin]     Weakness   . Crestor [Rosuvastatin]   . Cymbalta [Duloxetine Hcl] Other (See Comments)    Excessive sedation  . Lipitor [Atorvastatin Calcium]     Muscle pain  . Lisinopril   . Simvastatin     Muscle pain    Social History:  Ambulatory  cane Lives at [facility] Independent living     reports  that he quit smoking about 45 years ago. His smoking use included Cigarettes. He has a 11.25 pack-year smoking history. He has never used smokeless tobacco. He reports that he does not drink alcohol or use illicit drugs.   Family History: family history includes Diabetes type II in his father; Other in his mother.    Physical Exam: Patient Vitals for the past 24 hrs:  BP Temp Temp src Pulse Resp SpO2 Height Weight  02/18/13 2300 152/66 mmHg - - 60 15 95 % - -  02/18/13 2230 144/79 mmHg - - 60 14 95 % - -  02/18/13 2222 - - - 60 15 97 % - -  02/18/13 2215 - - - 59 15 98 % - -  02/18/13 2145 - - - 60 14 95 % - -  02/18/13 2100 165/81 mmHg - - 61 11 96 % - -  02/18/13 2045 - - - 61 22 97 % - -  02/18/13 2030 188/115 mmHg 97.7 F (36.5 C) - 63 12 98 % - -  02/18/13 2008 187/73 mmHg 97.4 F (36.3 C) Oral 61 20 98 % 5\' 9"  (1.753 m) 82.464 kg (181 lb 12.8 oz)    1. General:  in No Acute distress 2. Psychological: Alert and   Oriented 3. Head/ENT:   Moist  Mucous Membranes                          Head Non traumatic, neck supple                          Normal  Dentition 4. SKIN:  decreased Skin turgor,  Skin clean Dry and intact no rash 5. Heart: Regular rate and rhythm systolic Murmur, Rub or gallop 6. Lungs: Clear to auscultation bilaterally, no wheezes or crackles   7. Abdomen: Soft, non-tender, Non distended 8. Lower extremities: no clubbing, cyanosis, or edema 9. Neurologically  Pronounced right facial droop, strength in the upper and lower extremities appear to be intact no pronator drift noted. Slurred speech 10. MSK: Normal range of motion  body mass index is 26.83 kg/(m^2).   Labs on Admission:   Recent Labs  02/18/13 2025 02/18/13 2032  NA 128* 129*  K 3.5 3.4*  CL 91* 92*  CO2 26  --   GLUCOSE 121* 127*  BUN 19 19  CREATININE 1.12 1.20  CALCIUM 9.0  --     Recent Labs  02/18/13 2025  AST 23  ALT 16  ALKPHOS 90  BILITOT 0.7  PROT 6.7  ALBUMIN 3.6    No results found for this basename: LIPASE, AMYLASE,  in the last 72 hours  Recent Labs  02/18/13 2025 02/18/13 2032  WBC 6.9  --   NEUTROABS 3.7  --   HGB 15.5 16.3  HCT 43.1 48.0  MCV 92.3  --   PLT 217  --     Recent Labs  02/18/13 2025  TROPONINI <0.30   No results found for this basename: TSH, T4TOTAL, FREET3, T3FREE, THYROIDAB,  in the last 72 hours No results found for this basename: VITAMINB12, FOLATE, FERRITIN, TIBC, IRON, RETICCTPCT,  in the last 72 hours Lab Results  Component Value Date   HGBA1C 6.0 01/08/2013    Estimated Creatinine Clearance: 41.7 ml/min (by C-G formula based on Cr of 1.2). ABG    Component Value Date/Time   TCO2 26 02/18/2013 2032     No results found for this basename: DDIMER     Other results:  I have pearsonaly reviewed this: ECG REPORT  Rate: 61  Rhythm: Paced   UA no evidence of infection   Cultures:    Component Value Date/Time   SDES SPUTUM 03/04/2011 1844   SDES SPUTUM 03/04/2011 1844   SPECREQUEST NONE 03/04/2011 1844   SPECREQUEST NONE 03/04/2011 1844   CULT NORMAL OROPHARYNGEAL FLORA 03/04/2011 1844   REPTSTATUS 03/04/2011 FINAL 03/04/2011 1844   REPTSTATUS 03/07/2011 FINAL 03/04/2011 1844       Radiological Exams on Admission: Ct Head (brain) Wo Contrast  02/18/2013   CLINICAL DATA:  Last seen normal at 3 p.m., right-sided weakness, right facial droop, slurred speech, history hypertension, coronary artery disease  EXAM: CT HEAD WITHOUT CONTRAST  TECHNIQUE: Contiguous axial images were obtained from the base of the skull through the vertex without intravenous  contrast.  COMPARISON:  03/04/2011  FINDINGS: Generalized atrophy.  Normal ventricular morphology.  No midline shift or mass effect.  Small vessel chronic ischemic changes of deep cerebral white matter.  Small focus of low attenuation identified right thalamus likely representing a small lacunar infarct, question slightly larger than on previous exam.   No intracranial hemorrhage, mass lesion or evidence of acute cortical infarction.  No extra-axial fluid collections.  Atherosclerotic calcifications of internal carotid arteries and vertebral arteries at skullbase.  No acute bone or sinus abnormalities.  IMPRESSION: Atrophy with small vessel chronic ischemic changes of deep cerebral white matter.  Small lacunar infarct at right thalamus, question slightly larger than on previous exam.  No evidence of cortical infarction or intracranial hemorrhage.  Critical Value/emergent results were called by telephone at the time of interpretation on 02/18/2013 at 8:34 PM to Dr. Amada Jupiter, who verbally acknowledged these results.   Electronically Signed   By: Ulyses Southward M.D.   On: 02/18/2013 20:34    Chart has been reviewed  Assessment/Plan  79 year old gentleman with history of atrial fibrillation on Coumadin presents with slurred speech with CT scan worrisome for right thalamic infarct  Present on Admission:  CVA  - will admit based on CVA protocol, unable to obtain MRA/MRI DUE to pacemaker, will get Carotid Doppler and Echo, obtain cardiac enzymes,  ECG,  Lipid panel, TSH. Order PT/OT evaluation. Will make sure patient is on antiplatelet agent.  Neurology consult has been call.     . Atrial fibrillation - continue Coumadin currently rate controlled  . Congestive heart failure, unspecified - stable  euvolemic  . HTN (hypertension) - continue home medication  . Hyponatremia - obtain urine electrolytes this is chronic and stable  . Coronary atherosclerosis of unspecified type of vessel, native or graft - currently no chest pain continue home medications  . Type II or unspecified type diabetes mellitus without mention of complication, not stated as uncontrolled - sensitive scale hold by mouth meds   Prophylaxis: SCD, Protonix  CODE STATUS: full code  Other plan as per orders.  I have spent a total of 55 min on this  admission  Raylyn Carton 02/19/2013, 12:13 AM

## 2013-02-19 NOTE — Progress Notes (Signed)
First call to ED for report at 0054 but no response. Second call at 0105.

## 2013-02-19 NOTE — Progress Notes (Addendum)
ANTICOAGULATION CONSULT NOTE - Initial Consult  Pharmacy Consult for Coumadin Indication: atrial fibrillation  Allergies  Allergen Reactions  . Caduet [Amlodipine-Atorvastatin]     Weakness   . Crestor [Rosuvastatin]   . Cymbalta [Duloxetine Hcl] Other (See Comments)    Excessive sedation  . Lipitor [Atorvastatin Calcium]     Muscle pain  . Lisinopril   . Simvastatin     Muscle pain   Patient Measurements: Height: 5\' 8"  (172.7 cm) Weight: 160 lb 4.8 oz (72.712 kg) IBW/kg (Calculated) : 68.4  Vital Signs: Temp: 97.2 F (36.2 C) (12/16 0204) Temp src: Oral (12/16 0204) BP: 184/59 mmHg (12/16 0204) Pulse Rate: 60 (12/16 0204)  Labs:  Recent Labs  02/18/13 2025 02/18/13 2032  HGB 15.5 16.3  HCT 43.1 48.0  PLT 217  --   APTT 42*  --   LABPROT 22.2*  --   INR 2.02*  --   CREATININE 1.12 1.20  TROPONINI <0.30  --     Estimated Creatinine Clearance: 40.4 ml/min (by C-G formula based on Cr of 1.2).   Medical History: Past Medical History  Diagnosis Date  . Hypertension   . Coronary artery disease   . Arthritis   . Cataract   . Anxiety   . Blood transfusion without reported diagnosis   . Hyperlipidemia   . Heart murmur   . Allergy   . Thyroid disease   . PAF (paroxysmal atrial fibrillation)     coumadin  . History of abdominal aortic aneurysm   . History of echocardiogram 12/2008    EF >55%; mild concentric LVH; mild MR; mild-mod TR; mild AV regurg;   . History of nuclear stress test 12/2010    lexiscan; low risk; compared to prior study, perfusion improved  . History of Doppler ultrasound 05/22/2012    LEAs; R anterior tibial artery appeared occluded; L posterior tibial shows short segment of occlusive ds  . History of Doppler ultrasound 05/22/2012    Abdominal Aortic Doppler; slight increase in fusiform aneurysm     Medications:  Prescriptions prior to admission  Medication Sig Dispense Refill  . allopurinol (ZYLOPRIM) 100 MG tablet Take 50 mg by  mouth every morning.      Marland Kitchen amoxicillin (AMOXIL) 500 MG tablet Take 500 mg by mouth. Take four tablets one hour prior to dental procedures.      Marland Kitchen aspirin EC 81 MG tablet Take 81 mg by mouth daily.        . cetaphil (CETAPHIL) lotion Apply 1 application topically daily as needed. Compounded with triamcinolone cream 1% (1:3 ratio).  For rash       . Cholecalciferol (VITAMIN D) 400 UNITS capsule Take 400 Units by mouth 2 (two) times daily.       . diphenhydrAMINE (BENADRYL) 25 MG tablet Take 25 mg by mouth every 6 (six) hours as needed for itching.      . hydrochlorothiazide (HYDRODIURIL) 25 MG tablet Take 25 mg by mouth daily.      . irbesartan (AVAPRO) 300 MG tablet Take 300 mg by mouth at bedtime. Take one tablet daily for blood pressure      . levothyroxine (SYNTHROID, LEVOTHROID) 75 MCG tablet Take 75 mcg by mouth daily before breakfast.      . LORazepam (ATIVAN) 0.5 MG tablet Take 0.5 mg by mouth at bedtime as needed for anxiety or sleep.      . metoprolol (LOPRESSOR) 100 MG tablet Take 100 mg by mouth 2 (two) times daily.       Marland Kitchen  nitroGLYCERIN (NITROSTAT) 0.4 MG SL tablet Place 0.4 mg under the tongue every 5 (five) minutes as needed. For chest pain       . potassium chloride (MICRO-K) 10 MEQ CR capsule Take 10 mEq by mouth 2 (two) times daily.      Marland Kitchen warfarin (COUMADIN) 2.5 MG tablet Take 2.5 mg by mouth daily.       Marland Kitchen zolpidem (AMBIEN) 10 MG tablet Take 2.5-5 mg by mouth at bedtime as needed for sleep.        Assessment: 79 yo male with h/o Afib, new CVA, for Coumadin  Goal of Therapy:  INR 2-3 Monitor platelets by anticoagulation protocol: Yes   Plan:  Daily INR  Abbott, Gary Fleet 02/19/2013,2:49 AM  I have reviewed patient's meds. INR this am 1.98, CBC, plts wnl Will give coumadin 2.5mg  x 1 tonight  Daily INR, monitor signs of bleeding  Zaccheus Edmister B. Artelia Laroche, PharmD Clinical Pharmacist - Resident Pager: [phone] Phone: [phone] 02/19/2013 9:04 AM

## 2013-02-19 NOTE — Evaluation (Addendum)
Clinical/Bedside Swallow Evaluation Patient Details  Name: Jon [name] MRN: [id number] Date of Birth: [birth year]  Today's Date: 02/19/2013 Time: 4098-1191 SLP Time Calculation (min): 15 min  Past Medical History:  Past Medical History  Diagnosis Date  . Hypertension   . Coronary artery disease   . Arthritis   . Cataract   . Anxiety   . Blood transfusion without reported diagnosis   . Hyperlipidemia   . Heart murmur   . Allergy   . Thyroid disease   . PAF (paroxysmal atrial fibrillation)     coumadin  . History of abdominal aortic aneurysm   . History of echocardiogram 12/2008    EF >55%; mild concentric LVH; mild MR; mild-mod TR; mild AV regurg;   . History of nuclear stress test 12/2010    lexiscan; low risk; compared to prior study, perfusion improved  . History of Doppler ultrasound 05/22/2012    LEAs; R anterior tibial artery appeared occluded; L posterior tibial shows short segment of occlusive ds  . History of Doppler ultrasound 05/22/2012    Abdominal Aortic Doppler; slight increase in fusiform aneurysm    Past Surgical History:  Past Surgical History  Procedure Laterality Date  . Coronary artery bypass graft  1989  . Pacemaker insertion  2011  . Insert / replace / remove pacemaker    . Eye surgery    . Joint replacement    . Hernia repair    . Coronary angioplasty with stent placement  2008    stent to SVG to OM   HPI:  Jon Jon [name] is Jon 79 y.o. male with Jon history of atrial fibrillation who is on Coumadin who presents with right-sided incoordination and dysarthria that started around 3 PM. He states that he is hoping things or any get better, but instead they got worse and therefore he is now seeking medical attention. He has not missed any doses of his Coumadin. His daughter confirms that the right-sided discoordination as well as the slurred speech are new. Pt had an esophagram in 2003 showing hiatal hernia, normal swallowing function, prominent  cricopharyngeus, small left hypopharyngeal diverticulum   Assessment / Plan / Recommendation Clinical Impression  Pt demonstrates adequate swallow function. Suggested precautions regarding mild right labial weakness. No SLP f/u needed. Pt may initiate Jon regular diet and thin liquids.     Aspiration Risk       Diet Recommendation          Other  Recommendations     Follow Up Recommendations       Frequency and Duration        Pertinent Vitals/Pain NA    SLP Swallow Goals     Swallow Study Prior Functional Status       General HPI: Jon [name] is Jon 78 y.o. male with Jon history of atrial fibrillation who is on Coumadin who presents with right-sided incoordination and dysarthria that started around 3 PM. He states that he is hoping things or any get better, but instead they got worse and therefore he is now seeking medical attention. He has not missed any doses of his Coumadin. His daughter confirms that the right-sided discoordination as well as the slurred speech are new. Pt had an esophagram in 2003 showing hiatal hernia, normal swallowing function, prominent cricopharyngeus, small left hypopharyngeal diverticulum Type of Study: Bedside swallow evaluation Previous Swallow Assessment: Esophagram 2003 see HPI Diet Prior to this Study: NPO Temperature Spikes Noted: No Respiratory Status: Room air History  of Recent Intubation: No Behavior/Cognition: Alert;Cooperative;Pleasant mood Oral Cavity - Dentition: Dentures, top;Dentures, bottom Self-Feeding Abilities: Able to feed self Patient Positioning: Upright in bed Baseline Vocal Quality: Clear Volitional Cough: Strong Volitional Swallow: Able to elicit    Oral/Motor/Sensory Function Overall Oral Motor/Sensory Function: Right labial droop, weakness, lingual weakness.   Ice Chips     Thin Liquid Thin Liquid: Within functional limits Presentation: Cup;Straw    Nectar Thick Nectar Thick Liquid: Not tested   Honey Thick Honey  Thick Liquid: Not tested   Puree Puree: Within functional limits   Solid   GO    Solid: Within functional limits      [facility], [place] CCC-SLP [phone]  Edmund Holcomb, Riley Nearing 02/19/2013,9:37 AM

## 2013-02-19 NOTE — Progress Notes (Signed)
Daughter concerned about patient's increased slurred speech and facial droop. MD notified and ordered stat CT. Order carried out.

## 2013-02-19 NOTE — Plan of Care (Signed)
Physical therapy was getting ready to work with pt. Pt just woke up from a nap. Pt noticed that his speech is getting worse. More slurred than this am. Pt is alert and oriented x 3. Visitor at bedside. Denies headache, dizziness or lightheadedness. Vs obtained. 95% on RA. BP is 159/61. Heart rate is 60. Speech therapist Kendal Hymen came to reassess pt. Assessed and confirmed that speech is more slurred and weak than this am. States cont to let him eat regular diet unless pt gets worse. Dr Red Christians and updated MD. Md states to call the Neuro stroke team. Annie Main called and states to call Kristie Cowman. Kristie Cowman paged and waiting for her to call back.

## 2013-02-19 NOTE — ED Provider Notes (Signed)
CSN: [id number]     Arrival date & time 02/18/13  2007 History   First MD Initiated Contact with Patient 02/18/13 2017     Chief Complaint  Patient presents with  . Code Stroke   (Consider location/radiation/quality/duration/timing/severity/associated sxs/prior Treatment) HPI Jon [name] is a 79 y.o. male who presents to the emergency department for emergency department for concern of a stroke.  Patient reports that at around 3 today he felt like he was havving slurred speech and could not annunciate as well.  Then he noticed that his R leg felt funny.  Went out to dinner with family with these symptoms shortly afterwards and was having continued trouble talking and was now not able to swallow food as well.  Family concerned that this could be a stroke and called EMS.  Code stroke activated prehospital.    Past Medical History  Diagnosis Date  . Hypertension   . Coronary artery disease   . Arthritis   . Cataract   . Anxiety   . Blood transfusion without reported diagnosis   . Hyperlipidemia   . Heart murmur   . Allergy   . Thyroid disease   . PAF (paroxysmal atrial fibrillation)     coumadin  . History of abdominal aortic aneurysm   . History of echocardiogram 12/2008    EF >55%; mild concentric LVH; mild MR; mild-mod TR; mild AV regurg;   . History of nuclear stress test 12/2010    lexiscan; low risk; compared to prior study, perfusion improved  . History of Doppler ultrasound 05/22/2012    LEAs; R anterior tibial artery appeared occluded; L posterior tibial shows short segment of occlusive ds  . History of Doppler ultrasound 05/22/2012    Abdominal Aortic Doppler; slight increase in fusiform aneurysm    Past Surgical History  Procedure Laterality Date  . Coronary artery bypass graft  1989  . Pacemaker insertion  2011  . Insert / replace / remove pacemaker    . Eye surgery    . Joint replacement    . Hernia repair    . Coronary angioplasty with stent placement  2008     stent to SVG to OM   Family History  Problem Relation Age of Onset  . Diabetes type II Father   . Other Mother     PAD with amputations   History  Substance Use Topics  . Smoking status: Former Smoker -- 0.75 packs/day for 15 years    Types: Cigarettes    Quit date: 03/04/1967  . Smokeless tobacco: Never Used  . Alcohol Use: No    Review of Systems  Constitutional: Negative for fever and chills.  HENT: Negative for congestion and sore throat.   Respiratory: Negative for cough.   Gastrointestinal: Negative for nausea, vomiting, abdominal pain, diarrhea and constipation.  Endocrine: Negative for polyuria.  Genitourinary: Negative for dysuria and hematuria.  Musculoskeletal: Negative for neck pain.  Skin: Negative for rash.  Neurological: Positive for speech difficulty. Negative for headaches.  Psychiatric/Behavioral: Negative.   All other systems reviewed and are negative.    Allergies  Caduet; Crestor; Cymbalta; Lipitor; Lisinopril; and Simvastatin  Home Medications   Current Outpatient Rx  Name  Route  Sig  Dispense  Refill  . allopurinol (ZYLOPRIM) 100 MG tablet   Oral   Take 50 mg by mouth every morning.         Marland Kitchen amoxicillin (AMOXIL) 500 MG tablet   Oral   Take 500 mg by  mouth. Take four tablets one hour prior to dental procedures.         Marland Kitchen aspirin EC 81 MG tablet   Oral   Take 81 mg by mouth daily.           . cetaphil (CETAPHIL) lotion   Topical   Apply 1 application topically daily as needed. Compounded with triamcinolone cream 1% (1:3 ratio).  For rash          . Cholecalciferol (VITAMIN D) 400 UNITS capsule   Oral   Take 400 Units by mouth 2 (two) times daily.          . diphenhydrAMINE (BENADRYL) 25 MG tablet   Oral   Take 25 mg by mouth every 6 (six) hours as needed for itching.         . hydrochlorothiazide (HYDRODIURIL) 25 MG tablet   Oral   Take 25 mg by mouth daily.         . irbesartan (AVAPRO) 300 MG tablet    Oral   Take 300 mg by mouth at bedtime. Take one tablet daily for blood pressure         . levothyroxine (SYNTHROID, LEVOTHROID) 75 MCG tablet   Oral   Take 75 mcg by mouth daily before breakfast.         . LORazepam (ATIVAN) 0.5 MG tablet   Oral   Take 0.5 mg by mouth at bedtime as needed for anxiety or sleep.         . metoprolol (LOPRESSOR) 100 MG tablet   Oral   Take 100 mg by mouth 2 (two) times daily.          . nitroGLYCERIN (NITROSTAT) 0.4 MG SL tablet   Sublingual   Place 0.4 mg under the tongue every 5 (five) minutes as needed. For chest pain          . potassium chloride (MICRO-K) 10 MEQ CR capsule   Oral   Take 10 mEq by mouth 2 (two) times daily.         Marland Kitchen warfarin (COUMADIN) 2.5 MG tablet   Oral   Take 2.5 mg by mouth daily.          Marland Kitchen zolpidem (AMBIEN) 10 MG tablet   Oral   Take 2.5-5 mg by mouth at bedtime as needed for sleep.          BP 165/61  Pulse 66  Temp(Src) 97.7 F (36.5 C) (Oral)  Resp 13  Ht 5\' 9"  (1.753 m)  Wt 181 lb 12.8 oz (82.464 kg)  BMI 26.83 kg/m2  SpO2 94% Physical Exam  Nursing note and vitals reviewed. Constitutional: He is oriented to person, place, and time. He appears well-developed and well-nourished. No distress.  HENT:  Head: Normocephalic and atraumatic.  Right Ear: External ear normal.  Left Ear: External ear normal.  Mouth/Throat: Oropharynx is clear and moist. No oropharyngeal exudate.  Eyes: Conjunctivae are normal. Pupils are equal, round, and reactive to light. Right eye exhibits no discharge.  Neck: Normal range of motion. Neck supple. No tracheal deviation present.  Cardiovascular: Normal rate and intact distal pulses.  An irregularly irregular rhythm present.  Pulmonary/Chest: Effort normal. No respiratory distress. He has no wheezes. He has no rales.  Abdominal: Soft. He exhibits no distension. There is no tenderness. There is no rebound and no guarding.  Musculoskeletal: Normal range of  motion.  Neurological: He is alert and oriented to person, place, and time. He has  normal strength. No cranial nerve deficit or sensory deficit. He exhibits normal muscle tone. GCS eye subscore is 4. GCS verbal subscore is 5. GCS motor subscore is 6.  Dysmetria with FTN and HTS on R.  Mild slurred speech.  Skin: Skin is warm and dry. No rash noted. He is not diaphoretic.  Psychiatric: He has a normal mood and affect.    ED Course  Procedures (including critical care time) Labs Review Labs Reviewed  PROTIME-INR - Abnormal; Notable for the following:    Prothrombin Time 22.2 (*)    INR 2.02 (*)    All other components within normal limits  APTT - Abnormal; Notable for the following:    aPTT 42 (*)    All other components within normal limits  DIFFERENTIAL - Abnormal; Notable for the following:    Monocytes Relative 13 (*)    All other components within normal limits  COMPREHENSIVE METABOLIC PANEL - Abnormal; Notable for the following:    Sodium 128 (*)    Chloride 91 (*)    Glucose, Bld 121 (*)    GFR calc non Af Amer 56 (*)    GFR calc Af Amer 65 (*)    All other components within normal limits  URINALYSIS, ROUTINE W REFLEX MICROSCOPIC - Abnormal; Notable for the following:    Hgb urine dipstick TRACE (*)    All other components within normal limits  GLUCOSE, CAPILLARY - Abnormal; Notable for the following:    Glucose-Capillary 110 (*)    All other components within normal limits  POCT I-STAT, CHEM 8 - Abnormal; Notable for the following:    Sodium 129 (*)    Potassium 3.4 (*)    Chloride 92 (*)    Glucose, Bld 127 (*)    All other components within normal limits  CBC  TROPONIN I  URINE RAPID DRUG SCREEN (HOSP PERFORMED)  URINE MICROSCOPIC-ADD ON  POCT I-STAT TROPONIN I   Imaging Review Ct Head (brain) Wo Contrast  02/18/2013   CLINICAL DATA:  Last seen normal at 3 p.m., right-sided weakness, right facial droop, slurred speech, history hypertension, coronary artery  disease  EXAM: CT HEAD WITHOUT CONTRAST  TECHNIQUE: Contiguous axial images were obtained from the base of the skull through the vertex without intravenous contrast.  COMPARISON:  03/04/2011  FINDINGS: Generalized atrophy.  Normal ventricular morphology.  No midline shift or mass effect.  Small vessel chronic ischemic changes of deep cerebral white matter.  Small focus of low attenuation identified right thalamus likely representing a small lacunar infarct, question slightly larger than on previous exam.  No intracranial hemorrhage, mass lesion or evidence of acute cortical infarction.  No extra-axial fluid collections.  Atherosclerotic calcifications of internal carotid arteries and vertebral arteries at skullbase.  No acute bone or sinus abnormalities.  IMPRESSION: Atrophy with small vessel chronic ischemic changes of deep cerebral white matter.  Small lacunar infarct at right thalamus, question slightly larger than on previous exam.  No evidence of cortical infarction or intracranial hemorrhage.  Critical Value/emergent results were called by telephone at the time of interpretation on 02/18/2013 at 8:34 PM to Dr. Amada Jupiter, who verbally acknowledged these results.   Electronically Signed   By: Ulyses Southward M.D.   On: 02/18/2013 20:34    EKG Interpretation    Date/Time:  Monday February 18 2013 20:10:48 EST Ventricular Rate:  61 PR Interval:  280 QRS Duration: 128 QT Interval:  432 QTC Calculation: 434 R Axis:   -27 Text Interpretation:  AV dual-paced rhythm with prolonged AV conduction Abnormal ECG ST depression/ T wave inversions Confirmed by ZAVITZ  MD, JOSHUA ([id number]) on 02/18/2013 8:22:21 PM            MDM   1. CVA (cerebral infarction)   2. Atrial fibrillation   3. HTN (hypertension)   4. Hyponatremia    Jon [name] is a 79 y.o. male with history of atrial fibrillation on coumadin who presented to the emergency department as a code stroke.  Stroke team took to scanner on  arrival.  Patient with small cerebellar stroke.  Not a TPA candidate per neurology.  Patient safe for floor admission.  Internal medicine consulted.  No other concurrent acute process.  Patient admitted.    Arloa Koh, MD 02/19/13 [phone]

## 2013-02-19 NOTE — Progress Notes (Signed)
Stroke Team Progress Note  HISTORY  Jon [name] is Jon 79 y.o. male with Jon history of atrial fibrillation who is on Coumadin who presents with right-sided incoordination and dysarthria that started around 3 PM. He states that he is hoping things or any get better, but instead they got worse and therefore he is now seeking medical attention. He has not missed any doses of his Coumadin. His daughter confirms that the right-sided discoordination as well as the slurred speech are new.  Patient was not Jon TPA candidate secondary to outside window. He was admitted to the neuro for further evaluation and treatment.  SUBJECTIVE His daughter is at the bedside.  Overall he feels his condition is completely improved.   OBJECTIVE Most recent Vital Signs: Filed Vitals:   02/19/13 0437 02/19/13 0635 02/19/13 0845 02/19/13 1000  BP: 180/62 175/57 190/75 188/65  Pulse: 59 63 61 62  Temp: 97.2 F (36.2 C) 97.8 F (36.6 C) 97.4 F (36.3 C) 97.2 F (36.2 C)  TempSrc: Oral Oral Oral Oral  Resp: 20 18 18 20   Height:      Weight:      SpO2: 93% 96% 97% 99%   CBG (last 3)   Recent Labs  02/18/13 2043  GLUCAP 110*    IV Fluid Intake:   . sodium chloride 75 mL/hr at 02/19/13 0212    MEDICATIONS  . allopurinol  50 mg Oral q morning - 10a  . aspirin  300 mg Rectal Daily   Or  . aspirin  325 mg Oral Daily  . irbesartan  300 mg Oral QHS  . levothyroxine  75 mcg Oral QAC breakfast  . metoprolol  100 mg Oral BID  . potassium chloride  10 mEq Oral BID  . warfarin  2.5 mg Oral ONCE-1800  . Warfarin - Pharmacist Dosing Inpatient   Does not apply q1800   PRN:  acetaminophen, acetaminophen, LORazepam, senna-docusate, zolpidem  Diet:  Cardiac Thin liquids Activity:  Up with assistance DVT Prophylaxis:  On warfarin  CLINICALLY SIGNIFICANT STUDIES Basic Metabolic Panel:  Recent Labs Lab 02/18/13 2025 02/18/13 2032  NA 128* 129*  K 3.5 3.4*  CL 91* 92*  CO2 26  --   GLUCOSE 121* 127*  BUN  19 19  CREATININE 1.12 1.20  CALCIUM 9.0  --    Liver Function Tests:  Recent Labs Lab 02/18/13 2025  AST 23  ALT 16  ALKPHOS 90  BILITOT 0.7  PROT 6.7  ALBUMIN 3.6   CBC:  Recent Labs Lab 02/18/13 2025 02/18/13 2032  WBC 6.9  --   NEUTROABS 3.7  --   HGB 15.5 16.3  HCT 43.1 48.0  MCV 92.3  --   PLT 217  --    Coagulation:  Recent Labs Lab 02/15/13 1120 02/18/13 2025 02/19/13 0355  LABPROT  --  22.2* 21.9*  INR 1.9 2.02* 1.98*   Cardiac Enzymes:  Recent Labs Lab 02/18/13 2025  TROPONINI <0.30   Urinalysis:  Recent Labs Lab 02/18/13 2046  COLORURINE YELLOW  LABSPEC 1.010  PHURINE 6.5  GLUCOSEU NEGATIVE  HGBUR TRACE*  BILIRUBINUR NEGATIVE  KETONESUR NEGATIVE  PROTEINUR NEGATIVE  UROBILINOGEN 0.2  NITRITE NEGATIVE  LEUKOCYTESUR NEGATIVE   Lipid Panel    Component Value Date/Time   CHOL 187 02/19/2013 0355   TRIG 181* 02/19/2013 0355   HDL 34* 02/19/2013 0355   CHOLHDL 5.5 02/19/2013 0355   VLDL 36 02/19/2013 0355   LDLCALC 117* 02/19/2013 0355  HgbA1C  Lab Results  Component Value Date   HGBA1C 6.0 01/08/2013    Urine Drug Screen:     Component Value Date/Time   LABOPIA NONE DETECTED 02/18/2013 2046   COCAINSCRNUR NONE DETECTED 02/18/2013 2046   LABBENZ NONE DETECTED 02/18/2013 2046   AMPHETMU NONE DETECTED 02/18/2013 2046   THCU NONE DETECTED 02/18/2013 2046   LABBARB NONE DETECTED 02/18/2013 2046    Alcohol Level: No results found for this basename: ETH,  in the last 168 hours  Ct Angio Head W/cm &/or Wo Cm  02/19/2013   CLINICAL DATA:  Slurred speech.  Stroke.  EXAM: CT ANGIOGRAPHY HEAD AND NECK  TECHNIQUE: Multidetector CT imaging of the head and neck was performed using the standard protocol during bolus administration of intravenous contrast. Multiplanar CT image reconstructions including MIPs were obtained to evaluate the vascular anatomy. Carotid stenosis measurements (when applicable) are obtained utilizing NASCET criteria,  using the distal internal carotid diameter as the denominator.  CONTRAST:  80mL OMNIPAQUE IOHEXOL 350 MG/ML SOLN  COMPARISON:  CT head 02/18/2013  FINDINGS: CTA HEAD FINDINGS  Mild to moderate atrophy. Mild to moderate chronic microvascular ischemic change in the white matter. Small well-defined hypodensity right medial thalamus unchanged from 2012 and consistent with chronic infarct. Negative for acute infarct. Negative for hemorrhage or mass lesion.  Extensive intracranial atherosclerotic disease with calcified cavernous carotid and vertebral arteries.  Moderate stenosis right cavernous carotid with extensive atherosclerotic calcification. Fusiform dilatation of the supra clinoid internal carotid artery on the right measuring 5.4 mm in diameter. This is most likely due to atherosclerotic disease. Right anterior and right middle cerebral arteries are patent without significant stenosis.  Diffuse atherosclerotic disease left cavernous carotid which is calcified and mildly dilated diffusely due to atherosclerotic disease. Mild stenosis. Left anterior and middle cerebral arteries are patent bilaterally.  Atherosclerotic calcification in the distal vertebral arteries. The basilar is widely patent. Posterior inferior cerebellar artery is patent bilaterally. Superior cerebellar and posterior cerebral arteries are patent bilaterally. Fetal origin of the right posterior cerebral artery with hypoplastic right P1 segment.  Review of the MIP images confirms the above findings.  CTA NECK FINDINGS  Negative for mass or adenopathy in the neck.  Lung apices are clear.  Extensive atherosclerotic disease. Atherosclerotic aorta. Three vessel aortic arch. Proximal great vessels are patent.  Right common carotid artery is patent. There is atherosclerotic calcified and noncalcified plaque the distal common carotid artery as well as the proximal internal and external carotid arteries. No significant stenosis is identified. The plaque is  irregular and plaque ulceration as Jon source of emboli cannot be excluded.  Left carotid: Left common carotid artery is widely patent. Atherosclerotic thickening of the carotid bifurcation with small areas of calcification. No significant left carotid stenosis. Negative for dissection.  Vertebral arteries: Both vertebral arteries are patent to the basilar. Atherosclerotic calcification and mild to moderate stenosis of the distal vertebral artery bilaterally.  Review of the MIP images confirms the above findings.  IMPRESSION: Diffuse atherosclerotic disease  Moderate stenosis of the right cavernous carotid with heavily calcified plaque. There is fusiform dilatation of the right supra clinoid internal carotid artery compatible with atherosclerotic disease.  Mild to moderate atherosclerotic stenosis of the left cavernous carotid.  Calcified plaque with mild to moderate stenosis distal vertebral artery bilaterally  Diffuse atherosclerotic irregularity and plaque formation of the distal right common carotid artery and proximal internal carotid artery without significant stenosis. Plaque ulceration is present and is Jon source of  emboli.  Atherosclerotic disease in the left carotid bifurcation without hemodynamically significant stenosis.   Electronically Signed   By: Marlan Palau M.D.   On: 02/19/2013 08:40   Dg Chest 2 View  02/19/2013   CLINICAL DATA:  History CVA  EXAM: CHEST  2 VIEW  COMPARISON:  11/08/2012  FINDINGS: Slightly low lung volumes. There is mild elevation right hemidiaphragm. Patient is status post median sternotomy and coronary artery bypass grafting. Jon left-sided chest wall pacing unit is appreciated with lead tips projecting in the region the right atrium and right ventricle. No focal regions of consolidation or focal infiltrates are appreciated. Degenerative changes identified involving the right and left shoulders.  IMPRESSION: Low lung volumes without evidence of acute cardiopulmonary disease.    Electronically Signed   By: Salome Holmes M.D.   On: 02/19/2013 08:30   Ct Head (brain) Wo Contrast  02/18/2013   CLINICAL DATA:  Last seen normal at 3 p.m., right-sided weakness, right facial droop, slurred speech, history hypertension, coronary artery disease  EXAM: CT HEAD WITHOUT CONTRAST  TECHNIQUE: Contiguous axial images were obtained from the base of the skull through the vertex without intravenous contrast.  COMPARISON:  03/04/2011  FINDINGS: Generalized atrophy.  Normal ventricular morphology.  No midline shift or mass effect.  Small vessel chronic ischemic changes of deep cerebral white matter.  Small focus of low attenuation identified right thalamus likely representing Jon small lacunar infarct, question slightly larger than on previous exam.  No intracranial hemorrhage, mass lesion or evidence of acute cortical infarction.  No extra-axial fluid collections.  Atherosclerotic calcifications of internal carotid arteries and vertebral arteries at skullbase.  No acute bone or sinus abnormalities.  IMPRESSION: Atrophy with small vessel chronic ischemic changes of deep cerebral white matter.  Small lacunar infarct at right thalamus, question slightly larger than on previous exam.  No evidence of cortical infarction or intracranial hemorrhage.  Critical Value/emergent results were called by telephone at the time of interpretation on 02/18/2013 at 8:34 PM to Dr. Amada Jupiter, who verbally acknowledged these results.   Electronically Signed   By: Ulyses Southward M.D.   On: 02/18/2013 20:34   Ct Angio Neck W/cm &/or Wo/cm  02/19/2013   CLINICAL DATA:  Slurred speech.  Stroke.  EXAM: CT ANGIOGRAPHY HEAD AND NECK  TECHNIQUE: Multidetector CT imaging of the head and neck was performed using the standard protocol during bolus administration of intravenous contrast. Multiplanar CT image reconstructions including MIPs were obtained to evaluate the vascular anatomy. Carotid stenosis measurements (when applicable) are  obtained utilizing NASCET criteria, using the distal internal carotid diameter as the denominator.  CONTRAST:  80mL OMNIPAQUE IOHEXOL 350 MG/ML SOLN  COMPARISON:  CT head 02/18/2013  FINDINGS: CTA HEAD FINDINGS  Mild to moderate atrophy. Mild to moderate chronic microvascular ischemic change in the white matter. Small well-defined hypodensity right medial thalamus unchanged from 2012 and consistent with chronic infarct. Negative for acute infarct. Negative for hemorrhage or mass lesion.  Extensive intracranial atherosclerotic disease with calcified cavernous carotid and vertebral arteries.  Moderate stenosis right cavernous carotid with extensive atherosclerotic calcification. Fusiform dilatation of the supra clinoid internal carotid artery on the right measuring 5.4 mm in diameter. This is most likely due to atherosclerotic disease. Right anterior and right middle cerebral arteries are patent without significant stenosis.  Diffuse atherosclerotic disease left cavernous carotid which is calcified and mildly dilated diffusely due to atherosclerotic disease. Mild stenosis. Left anterior and middle cerebral arteries are patent bilaterally.  Atherosclerotic  calcification in the distal vertebral arteries. The basilar is widely patent. Posterior inferior cerebellar artery is patent bilaterally. Superior cerebellar and posterior cerebral arteries are patent bilaterally. Fetal origin of the right posterior cerebral artery with hypoplastic right P1 segment.  Review of the MIP images confirms the above findings.  CTA NECK FINDINGS  Negative for mass or adenopathy in the neck.  Lung apices are clear.  Extensive atherosclerotic disease. Atherosclerotic aorta. Three vessel aortic arch. Proximal great vessels are patent.  Right common carotid artery is patent. There is atherosclerotic calcified and noncalcified plaque the distal common carotid artery as well as the proximal internal and external carotid arteries. No significant  stenosis is identified. The plaque is irregular and plaque ulceration as Jon source of emboli cannot be excluded.  Left carotid: Left common carotid artery is widely patent. Atherosclerotic thickening of the carotid bifurcation with small areas of calcification. No significant left carotid stenosis. Negative for dissection.  Vertebral arteries: Both vertebral arteries are patent to the basilar. Atherosclerotic calcification and mild to moderate stenosis of the distal vertebral artery bilaterally.  Review of the MIP images confirms the above findings.  IMPRESSION: Diffuse atherosclerotic disease  Moderate stenosis of the right cavernous carotid with heavily calcified plaque. There is fusiform dilatation of the right supra clinoid internal carotid artery compatible with atherosclerotic disease.  Mild to moderate atherosclerotic stenosis of the left cavernous carotid.  Calcified plaque with mild to moderate stenosis distal vertebral artery bilaterally  Diffuse atherosclerotic irregularity and plaque formation of the distal right common carotid artery and proximal internal carotid artery without significant stenosis. Plaque ulceration is present and is Jon source of emboli.  Atherosclerotic disease in the left carotid bifurcation without hemodynamically significant stenosis.   Electronically Signed   By: Marlan Palau M.D.   On: 02/19/2013 08:40    CT of the brain  02/18/2013 Atrophy with small vessel chronic ischemic changes of deep cerebral  white matter. Small lacunar infarct at right thalamus, question slightly larger  than on previous exam. No evidence of cortical infarction or intracranial hemorrhage.  CTA: 02/19/2013: Diffuse atherosclerotic disease Moderate stenosis of the right cavernous carotid with heavily  calcified plaque. There is fusiform dilatation of the right supra clinoid internal carotid artery compatible with atherosclerotic disease. Mild to moderate atherosclerotic stenosis of the left  cavernous  carotid. Calcified plaque with mild to moderate stenosis distal vertebral artery bilaterally Diffuse atherosclerotic irregularity and plaque formation of the distal right common carotid artery and proximal internal carotid  artery without significant stenosis. Plaque ulceration is present and is Jon source of emboli. Atherosclerotic disease in the left carotid bifurcation without hemodynamically significant stenosis.   MRI of the brain  NA (pacer)  MRA of the brain  NA (pacer)  2D Echocardiogram  pending  Carotid Doppler  pending  CXR    EKG   dual paced.   Therapy Recommendations pending Filed Vitals:   02/19/13 1400  BP: 170/64  Pulse: 55  Temp: 97.6 F (36.4 C)  Resp: 18    Physical Exam   General: Well-developed, well-nourished, elderly White male, in no acute distress; Head: Normocephalic, atraumatic. Eyes: PERRLA, EOMI Lungs: Normal respiratory effort Heart: paced Abdomen: benign Extremities: No pretibial edema Neurologic: alert and oriented x3, appropriate and cooperative throughout examination. Mental Status: Alert, oriented, thought content appropriate.  Speech fluent without evidence of aphasia.  Able to follow 3 step commands without difficulty. Cranial Nerves: II: Discs flat bilaterally; Visual fields grossly normal, pupils equal, round,  reactive to light and accommodation III,IV, VI: ptosis not present, extra-ocular motions intact bilaterally V,VII: mild right facial droop, facial light touch sensation normal bilaterally VIII: hearing grossly normal bilaterally, normally wears hearing aids XII: midline tongue extension without atrophy or fasciculations Motor: Right : Upper extremity   5/5    Left:     Upper extremity   5/5  Lower extremity   5/5     Lower extremity   5/5 Tone and bulk:normal tone throughout; no atrophy noted Sensory: light touch intact throughout, bilaterally Cerebellar:normal finger-to-nose    ASSESSMENT Mr. Jon [name] is Jon 79 y.o. male presenting with dysmetria and dysarthria. Imaging without acute infarct. MRI not obtained secondary to pacemaker. TIA vs brainstem Infarct felt to be embolic secondary to atrial fibrillation.  On warfarin prior to admission. Now on aspirin 325 mg orally every day for secondary stroke prevention. Patient with resolved symptoms for the most part. Work up underway.   TIA vs acute CVA likely embolic from atrial fibrillation  Atrial fibrillation  S/p pace maker  Hypertension  CHF  DM Type 2, well controlled HgbA1c 6  CAD   Hospital day # 1  TREATMENT/PLAN  Add apixiban 2.5 mg bid for secondary stroke prevention.  2D ECHO pending  Cont risk factor modifications, prior muscle pain secondary to statin therapy; LDL above goal <70 at 117 on admission  Kristie Cowman, MD  Internal medicine Resident, PGY 3  02/19/2013 10:54 AM  I have personally obtained Jon history, examined the patient, evaluated imaging results, and formulated the assessment and plan of care. I agree with the above. Delia Heady, MD

## 2013-02-19 NOTE — Progress Notes (Addendum)
RN paged this NP secondary to pt showing more facial droop and worsening slurred speech. Pt here with known CVA and has been seen by neurology and placed on appropriate medications. Pt can not have MRI due to PM. Had CTA head and neck today reviewed by neuro. Due to change, will get a stat CT head to r/o extension of stroke, hemorrhagic conversion of stroke, and/or new stroke. This NP spoke to daughter on phone (in pt's room) and updated her on plan of action. Will follow for results of CT. Neuro is aware of change as well and agrees with CT head.  Jimmye Norman, NP [facility]  CT head neg for new finding or hemorrhage. Spoke to daughter and relayed results. Will continue same plan for now. Will text page neuro to look at CT.  Jimmye Norman, NP

## 2013-02-19 NOTE — Evaluation (Signed)
Speech Language Pathology Evaluation Patient Details Name: Jon [name] MRN: [id number] DOB: [birth year] Today's Date: 02/19/2013 Time: 4098-1191 SLP Time Calculation (min): 44 min  Problem List:  Patient Active Problem List   Diagnosis Date Noted  . CVA (cerebral infarction) 02/18/2013  . Memory change 01/24/2013  . Pruritic condition 01/24/2013  . Abnormal PFTs 10/30/2012  . Pruritus 09/13/2012  . Allergic rhinitis 09/13/2012  . Open wound of tongue and floor of mouth, without mention of complication 12/22/2011  . Unspecified venous (peripheral) insufficiency 12/15/2011  . Aortic aneurysm of unspecified site without mention of rupture 10/27/2011  . Nocturia 10/27/2011  . Pain in joint, lower leg 08/04/2011  . Long term (current) use of anticoagulants 03/17/2011  . Unspecified constipation 03/10/2011  . Muscle weakness (generalized) 03/10/2011  . Pneumonia 03/04/2011  . Hyponatremia 03/04/2011  . Atrial fibrillation 03/04/2011  . Fall 03/04/2011  . HTN (hypertension) 03/04/2011  . Hypothyroidism 03/04/2011  . Internal hemorrhoids without mention of complication 02/03/2011  . Cardiac pacemaker in situ 03/18/2010  . Basal cell carcinoma of skin of other and unspecified parts of face 12/24/2009  . Other specified cardiac dysrhythmias 12/24/2009  . Unspecified sinusitis (chronic) 07/01/2009  . Major depressive disorder, single episode, unspecified 03/05/2009  . Osteoarthrosis, unspecified whether generalized or localized, lower leg 01/19/2009  . Syncope and collapse 01/19/2009  . Obstructive sleep apnea (adult) (pediatric) 07/23/2008  . Disorders of bursae and tendons in shoulder region, unspecified 01/24/2007  . Unspecified vitamin D deficiency 10/18/2006  . Atherosclerosis of renal artery 10/18/2006  . Pain in joint, shoulder region 10/18/2006  . Right bundle branch block 04/19/2006  . Congestive heart failure, unspecified 03/16/2006  . Urinary frequency 03/16/2006  .  Spinal stenosis in cervical region 12/15/2004  . Type II or unspecified type diabetes mellitus without mention of complication, not stated as uncontrolled 11/24/2004  . Other and unspecified hyperlipidemia 11/03/2004  . Gout, unspecified 11/03/2004  . Coronary atherosclerosis of unspecified type of vessel, native or graft 11/03/2004  . Hypertrophy of prostate with urinary obstruction and other lower urinary tract symptoms (LUTS) 04/28/2004  . Osteoarthrosis, unspecified whether generalized or localized, unspecified site 02/05/2003  . Unspecified hearing loss 10/03/2000  . Impotence of organic origin 10/03/2000   Past Medical History:  Past Medical History  Diagnosis Date  . Hypertension   . Coronary artery disease   . Arthritis   . Cataract   . Anxiety   . Blood transfusion without reported diagnosis   . Hyperlipidemia   . Heart murmur   . Allergy   . Thyroid disease   . PAF (paroxysmal atrial fibrillation)     coumadin  . History of abdominal aortic aneurysm   . History of echocardiogram 12/2008    EF >55%; mild concentric LVH; mild MR; mild-mod TR; mild AV regurg;   . History of nuclear stress test 12/2010    lexiscan; low risk; compared to prior study, perfusion improved  . History of Doppler ultrasound 05/22/2012    LEAs; R anterior tibial artery appeared occluded; L posterior tibial shows short segment of occlusive ds  . History of Doppler ultrasound 05/22/2012    Abdominal Aortic Doppler; slight increase in fusiform aneurysm    Past Surgical History:  Past Surgical History  Procedure Laterality Date  . Coronary artery bypass graft  1989  . Pacemaker insertion  2011  . Insert / replace / remove pacemaker    . Eye surgery    . Joint replacement    .  Hernia repair    . Coronary angioplasty with stent placement  2008    stent to SVG to OM   HPI:  [name] is a 79 y.o. male with a history of atrial fibrillation who is on Coumadin who presents with right-sided  incoordination and dysarthria that started around 3 PM. He states that he is hoping things or any get better, but instead they got worse and therefore he is now seeking medical attention. He has not missed any doses of his Coumadin. His daughter confirms that the right-sided discoordination as well as the slurred speech are new. Pt had an esophagram in 2003 showing hiatal hernia, normal swallowing function, prominent cricopharyngeus, small left hypopharyngeal diverticulum   Assessment / Plan / Recommendation Clinical Impression  Pt presents with mild dysarthria due to right labial weakness and decreased speed of lingual movement for articulation. SLP reviewed speech strategies which pt was able to return demonstrate. Pt would benefit from f/u with home health SLP which his facility already provides.     SLP Assessment  All further Speech Lanaguage Pathology  needs can be addressed in the next venue of care    Follow Up Recommendations  Home health SLP    Frequency and Duration        Pertinent Vitals/Pain NA   SLP Goals     SLP Evaluation Prior Functioning  Cognitive/Linguistic Baseline: Within functional limits Type of Home: Independent living facility  Lives With: Spouse Vocation: Retired   IT consultant  Overall Cognitive Status: Within Functional Limits for tasks assessed Orientation Level: Oriented X4    Comprehension  Auditory Comprehension Overall Auditory Comprehension: Appears within functional limits for tasks assessed    Expression Verbal Expression Overall Verbal Expression: Appears within functional limits for tasks assessed   Oral / Motor Oral Motor/Sensory Function Overall Oral Motor/Sensory Function: Impaired Labial ROM: Reduced right Labial Symmetry: Abnormal symmetry right Labial Strength: Reduced Labial Sensation: Within Functional Limits Lingual ROM: Within Functional Limits Lingual Symmetry: Within Functional Limits Lingual Strength: Reduced Lingual  Sensation: Within Functional Limits Facial ROM: Within Functional Limits Facial Symmetry: Within Functional Limits Facial Strength: Within Functional Limits Facial Sensation: Within Functional Limits Velum: Within Functional Limits Mandible: Within Functional Limits Motor Speech Overall Motor Speech: Impaired Respiration: Within functional limits Phonation: Normal Resonance: Within functional limits Articulation: Impaired Level of Impairment: Phrase Intelligibility: Intelligible Motor Planning: Witnin functional limits   GO    [employer], MA CCC-SLP [phone]  Claudine Mouton 02/19/2013, 9:46 AM

## 2013-02-19 NOTE — Progress Notes (Signed)
*  PRELIMINARY RESULTS* Vascular Ultrasound Carotid Duplex (Doppler) has been completed.  Preliminary findings: Bilateral:  1-39% ICA stenosis.  Left vertebral artery flow is antegrade.  Right vertebral flow is demonstrating atypical waveforms. Possible proximal obstruction.    Farrel Demark, RDMS, RVT  02/19/2013, 10:25 AM

## 2013-02-19 NOTE — ED Provider Notes (Signed)
Medical screening examination/treatment/procedure(s) were conducted as a shared visit with non-physician practitioner(s) or resident and myself. I personally evaluated the patient during the encounter and agree with the findings and plan unless otherwise indicated.  I have personally reviewed any xrays and/ or EKG's with the provider and I agree with interpretation.  Stroke like sxs since 3 pm. Speech changes and facial droop with heavy right leg "dragging". Pt examined on arrival with neurology.  Slurred speech mild, mild dysmetria, mild right facial droop, moves all extremities equal bilateral, gross sensation intact, RRR. BP elevated. Pt not TPA candidate based on time and low NIH and coumadin use.  Pt admitted to medicine for further evaluation of likely stroke. No stroke hx. Pt on asa at home. CAD hx.  Stroke, Speech changes   Enid Skeens, MD 02/19/13 [phone]

## 2013-02-20 DIAGNOSIS — I509 Heart failure, unspecified: Secondary | ICD-10-CM

## 2013-02-20 DIAGNOSIS — I6350 Cerebral infarction due to unspecified occlusion or stenosis of unspecified cerebral artery: Secondary | ICD-10-CM | POA: Diagnosis not present

## 2013-02-20 DIAGNOSIS — I719 Aortic aneurysm of unspecified site, without rupture: Secondary | ICD-10-CM | POA: Diagnosis not present

## 2013-02-20 DIAGNOSIS — I359 Nonrheumatic aortic valve disorder, unspecified: Secondary | ICD-10-CM

## 2013-02-20 DIAGNOSIS — R942 Abnormal results of pulmonary function studies: Secondary | ICD-10-CM | POA: Diagnosis not present

## 2013-02-20 DIAGNOSIS — I2510 Atherosclerotic heart disease of native coronary artery without angina pectoris: Secondary | ICD-10-CM

## 2013-02-20 DIAGNOSIS — I701 Atherosclerosis of renal artery: Secondary | ICD-10-CM | POA: Diagnosis not present

## 2013-02-20 MED ORDER — EZETIMIBE 10 MG PO TABS
10.0000 mg | ORAL_TABLET | Freq: Every day | ORAL | Status: DC
  Administered 2013-02-20 – 2013-02-22 (×3): 10 mg via ORAL
  Filled 2013-02-20 (×4): qty 1

## 2013-02-20 NOTE — Progress Notes (Addendum)
Physical Therapy Treatment Patient Details Name: Jon [name] MRN: [id number] DOB: 1921-05-22 Today's Date: 02/20/2013 Time: 2725-3664 PT Time Calculation (min): 45 min  PT Assessment / Plan / Recommendation  History of Present Illness  Jon [name] is a 79 y.o. male with a history of atrial fibrillation who is on Coumadin who presents with right-sided incoordination and dysarthria that started around 3 PM. He states that he is hoping things or any get better, but instead they got worse and therefore he is now seeking medical attention. He has not missed any doses of his Coumadin. His daughter confirms that the right-sided discoordination as well as the slurred speech are new.   PT Comments   Patient continues to demonstrate deficits in functional mobility and balance this session. Patient use of rw helped to improve stability, however, patient continued to occasionally run into objects on right side. patiet also complained of knee pain (left) possibly arthritic in nature. Spoke with patient and daughter at length regarding discharge recommendations. Given patients high risk of fall in conjunction with blood thinners recommend patient discharge with 24/7 supervision and assist as needed in addition to continued PT. Also recommended use of rolling walker instead of Rolator until home PT has cleared him for use of rollator. Will continue to see as indicated and progress activity as tolerated.  Follow Up Recommendations  SNF;Supervision/Assistance - 24 hour           Equipment Recommendations  None recommended by PT    Recommendations for Other Services    Frequency Min 4X/week   Progress towards PT Goals Progress towards PT goals: Progressing toward goals  Plan Current plan remains appropriate    Precautions / Restrictions Precautions Precautions: Fall   Pertinent Vitals/Pain 5/10 left knee pain, some initial dizziness upon standing which resolved quickly    Mobility  Bed  Mobility Bed Mobility: Supine to Sit;Sitting - Scoot to Edge of Bed;Sit to Supine Supine to Sit: 5: Supervision Sitting - Scoot to Edge of Bed: 5: Supervision Sit to Supine: 5: Supervision Transfers Transfers: Sit to Stand;Stand to Sit Sit to Stand: 4: Min assist Stand to Sit: 4: Min assist Ambulation/Gait Ambulation/Gait Assistance: 4: Min Environmental consultant (Feet): 180 Feet Assistive device: Rolling walker (attempted cane, patient unable to use safely ) Ambulation/Gait Assistance Details: increased instability during ambulation, pt running into things on right side often, no significant LOB, increased fall risk Gait Pattern: Step-through pattern;Antalgic;Trunk flexed;Narrow base of support Gait velocity: varying General Gait Details: patient high fall risk secondary to instability    Exercises Other Exercises Other Exercises: patient performed morning routine of ther-ex and stretching to assist with arthritic pain  Other Exercises: mini squats, standing knee flexion, heel raises, weight shifts, side lunges (performed with assist for balance) Other Exercises: educated patient on BEFAST stroke symptoms   PT Diagnosis:    PT Problem List:   PT Treatment Interventions:     PT Goals (current goals can now be found in the care plan section) Acute Rehab PT Goals Patient Stated Goal: to be able to get moving PT Goal Formulation: With patient Time For Goal Achievement: 03/05/13 Potential to Achieve Goals: Good  Visit Information  Last PT Received On: 02/20/13 Assistance Needed: +1 History of Present Illness:  Jon [name] is a 79 y.o. male with a history of atrial fibrillation who is on Coumadin who presents with right-sided incoordination and dysarthria that started around 3 PM. He states that he is hoping things or any get better,  but instead they got worse and therefore he is now seeking medical attention. He has not missed any doses of his Coumadin. His daughter confirms that  the right-sided discoordination as well as the slurred speech are new.    Subjective Data  Subjective: my knee is very sore Patient Stated Goal: to be able to get moving   Cognition  Cognition Arousal/Alertness: Awake/alert Behavior During Therapy: WFL for tasks assessed/performed Overall Cognitive Status: Within Functional Limits for tasks assessed    Balance  Balance Balance Assessed: Yes High Level Balance High Level Balance Activites: Side stepping;Backward walking;Direction changes;Turns;Head turns High Level Balance Comments: instability noted, min guard to min assist  End of Session PT - End of Session Equipment Utilized During Treatment: Gait belt Activity Tolerance: Patient tolerated treatment well Patient left: in chair;with call bell/phone within reach;with family/visitor present Nurse Communication: Mobility status;Other (comment)   GP     Fabio Asa 02/20/2013, 11:29 AM Charlotte Crumb, PT DPT  [phone]

## 2013-02-20 NOTE — Progress Notes (Signed)
[facility] PROGRESS NOTE  Jon [name] RUE:[id number] DOB: Mar 19, 1921 DOA: 02/18/2013 PCP: Kimber Relic, MD  Assessment/Plan: CVA  -Neurology consulted  - unable to obtain MRA/MRI DUE to pacemaker,  - Echo pending  - PT/OT evaluation.  - On apixiban 2.5mg  bid   Atrial fibrillation - continue apixiban, currently rate controlled   Congestive heart failure, unspecified - stable, remains euvolemic   HTN (hypertension) - continue home medication   Hyponatremia - obtain urine electrolytes this is chronic and stable   Coronary atherosclerosis of unspecified type of vessel, native or graft - currently no chest pain continue home medications   Type II or unspecified type diabetes mellitus without mention of complication, not stated as uncontrolled - sensitive scale hold by mouth meds  Code Status: Full Family Communication: Pt in room (indicate person spoken with, relationship, and if by phone, the number) Disposition Plan: Pending  Consultants:  neurology  HPI/Subjective: No acute events noted.  Objective: Filed Vitals:   02/19/13 2230 02/20/13 0147 02/20/13 0635 02/20/13 1104  BP: 170/65 179/63 171/61 178/58  Pulse: 61 62 61 60  Temp: 97.5 F (36.4 C) 97.3 F (36.3 C) 97.4 F (36.3 C) 98 F (36.7 C)  TempSrc: Oral Oral Oral Oral  Resp: 20 18 18 18   Height:      Weight:      SpO2: 95% 96% 98% 100%    Intake/Output Summary (Last 24 hours) at 02/20/13 1153 Last data filed at 02/20/13 0700  Gross per 24 hour  Intake    480 ml  Output   1700 ml  Net  -1220 ml   Filed Weights   02/18/13 2008 02/19/13 0204  Weight: 82.464 kg (181 lb 12.8 oz) 72.712 kg (160 lb 4.8 oz)    Exam:  General:  Awake,in nad  Cardiovascular: regular, s1,s2  Respiratory: norma resp effort, no wheezing  Abdomen: soft, nondistended  Musculoskeletal: perfused, no clubbing   Data Reviewed: Basic Metabolic Panel:  Recent Labs Lab 02/18/13 2025 02/18/13 2032  NA  128* 129*  K 3.5 3.4*  CL 91* 92*  CO2 26  --   GLUCOSE 121* 127*  BUN 19 19  CREATININE 1.12 1.20  CALCIUM 9.0  --    Liver Function Tests:  Recent Labs Lab 02/18/13 2025  AST 23  ALT 16  ALKPHOS 90  BILITOT 0.7  PROT 6.7  ALBUMIN 3.6   No results found for this basename: LIPASE, AMYLASE,  in the last 168 hours No results found for this basename: AMMONIA,  in the last 168 hours CBC:  Recent Labs Lab 02/18/13 2025 02/18/13 2032  WBC 6.9  --   NEUTROABS 3.7  --   HGB 15.5 16.3  HCT 43.1 48.0  MCV 92.3  --   PLT 217  --    Cardiac Enzymes:  Recent Labs Lab 02/18/13 2025  TROPONINI <0.30   BNP (last 3 results) No results found for this basename: PROBNP,  in the last 8760 hours CBG:  Recent Labs Lab 02/18/13 2043  GLUCAP 110*    No results found for this or any previous visit (from the past 240 hour(s)).   Studies: Ct Angio Head W/cm &/or Wo Cm  02/19/2013   CLINICAL DATA:  Slurred speech.  Stroke.  EXAM: CT ANGIOGRAPHY HEAD AND NECK  TECHNIQUE: Multidetector CT imaging of the head and neck was performed using the standard protocol during bolus administration of intravenous contrast. Multiplanar CT image reconstructions including MIPs were obtained to  evaluate the vascular anatomy. Carotid stenosis measurements (when applicable) are obtained utilizing NASCET criteria, using the distal internal carotid diameter as the denominator.  CONTRAST:  80mL OMNIPAQUE IOHEXOL 350 MG/ML SOLN  COMPARISON:  CT head 02/18/2013  FINDINGS: CTA HEAD FINDINGS  Mild to moderate atrophy. Mild to moderate chronic microvascular ischemic change in the white matter. Small well-defined hypodensity right medial thalamus unchanged from 2012 and consistent with chronic infarct. Negative for acute infarct. Negative for hemorrhage or mass lesion.  Extensive intracranial atherosclerotic disease with calcified cavernous carotid and vertebral arteries.  Moderate stenosis right cavernous carotid  with extensive atherosclerotic calcification. Fusiform dilatation of the supra clinoid internal carotid artery on the right measuring 5.4 mm in diameter. This is most likely due to atherosclerotic disease. Right anterior and right middle cerebral arteries are patent without significant stenosis.  Diffuse atherosclerotic disease left cavernous carotid which is calcified and mildly dilated diffusely due to atherosclerotic disease. Mild stenosis. Left anterior and middle cerebral arteries are patent bilaterally.  Atherosclerotic calcification in the distal vertebral arteries. The basilar is widely patent. Posterior inferior cerebellar artery is patent bilaterally. Superior cerebellar and posterior cerebral arteries are patent bilaterally. Fetal origin of the right posterior cerebral artery with hypoplastic right P1 segment.  Review of the MIP images confirms the above findings.  CTA NECK FINDINGS  Negative for mass or adenopathy in the neck.  Lung apices are clear.  Extensive atherosclerotic disease. Atherosclerotic aorta. Three vessel aortic arch. Proximal great vessels are patent.  Right common carotid artery is patent. There is atherosclerotic calcified and noncalcified plaque the distal common carotid artery as well as the proximal internal and external carotid arteries. No significant stenosis is identified. The plaque is irregular and plaque ulceration as a source of emboli cannot be excluded.  Left carotid: Left common carotid artery is widely patent. Atherosclerotic thickening of the carotid bifurcation with small areas of calcification. No significant left carotid stenosis. Negative for dissection.  Vertebral arteries: Both vertebral arteries are patent to the basilar. Atherosclerotic calcification and mild to moderate stenosis of the distal vertebral artery bilaterally.  Review of the MIP images confirms the above findings.  IMPRESSION: Diffuse atherosclerotic disease  Moderate stenosis of the right cavernous  carotid with heavily calcified plaque. There is fusiform dilatation of the right supra clinoid internal carotid artery compatible with atherosclerotic disease.  Mild to moderate atherosclerotic stenosis of the left cavernous carotid.  Calcified plaque with mild to moderate stenosis distal vertebral artery bilaterally  Diffuse atherosclerotic irregularity and plaque formation of the distal right common carotid artery and proximal internal carotid artery without significant stenosis. Plaque ulceration is present and is a source of emboli.  Atherosclerotic disease in the left carotid bifurcation without hemodynamically significant stenosis.   Electronically Signed   By: Marlan Palau M.D.   On: 02/19/2013 08:40   Dg Chest 2 View  02/19/2013   CLINICAL DATA:  History CVA  EXAM: CHEST  2 VIEW  COMPARISON:  11/08/2012  FINDINGS: Slightly low lung volumes. There is mild elevation right hemidiaphragm. Patient is status post median sternotomy and coronary artery bypass grafting. A left-sided chest wall pacing unit is appreciated with lead tips projecting in the region the right atrium and right ventricle. No focal regions of consolidation or focal infiltrates are appreciated. Degenerative changes identified involving the right and left shoulders.  IMPRESSION: Low lung volumes without evidence of acute cardiopulmonary disease.   Electronically Signed   By: Salome Holmes M.D.   On: 02/19/2013  08:30   Ct Head Wo Contrast  02/19/2013   CLINICAL DATA:  Worsening facial droop and slurred speech.  EXAM: CT HEAD WITHOUT CONTRAST  TECHNIQUE: Contiguous axial images were obtained from the base of the skull through the vertex without intravenous contrast.  COMPARISON:  02/18/2013  FINDINGS: There is no evidence of intracranial hemorrhage, brain edema, or other signs of acute infarction. There is no evidence of intracranial mass lesion or mass effect. No abnormal extraaxial fluid collections are identified.  Mild to moderate  chronic small vessel disease is unchanged in appearance. Old right thalamic lacunar infarct is stable in appearance. Small old left posterior cerebellar infarct also unchanged. Ventricles remain normal in size. No skull abnormality identified.  IMPRESSION: No acute intracranial findings.  Stable chronic small vessel disease, old right thalamic lacune, and old left cerebellar infarct.   Electronically Signed   By: Myles Rosenthal M.D.   On: 02/19/2013 21:12   Ct Head (brain) Wo Contrast  02/18/2013   CLINICAL DATA:  Last seen normal at 3 p.m., right-sided weakness, right facial droop, slurred speech, history hypertension, coronary artery disease  EXAM: CT HEAD WITHOUT CONTRAST  TECHNIQUE: Contiguous axial images were obtained from the base of the skull through the vertex without intravenous contrast.  COMPARISON:  03/04/2011  FINDINGS: Generalized atrophy.  Normal ventricular morphology.  No midline shift or mass effect.  Small vessel chronic ischemic changes of deep cerebral white matter.  Small focus of low attenuation identified right thalamus likely representing a small lacunar infarct, question slightly larger than on previous exam.  No intracranial hemorrhage, mass lesion or evidence of acute cortical infarction.  No extra-axial fluid collections.  Atherosclerotic calcifications of internal carotid arteries and vertebral arteries at skullbase.  No acute bone or sinus abnormalities.  IMPRESSION: Atrophy with small vessel chronic ischemic changes of deep cerebral white matter.  Small lacunar infarct at right thalamus, question slightly larger than on previous exam.  No evidence of cortical infarction or intracranial hemorrhage.  Critical Value/emergent results were called by telephone at the time of interpretation on 02/18/2013 at 8:34 PM to Dr. Amada Jupiter, who verbally acknowledged these results.   Electronically Signed   By: Ulyses Southward M.D.   On: 02/18/2013 20:34   Ct Angio Neck W/cm &/or  Wo/cm  02/19/2013   CLINICAL DATA:  Slurred speech.  Stroke.  EXAM: CT ANGIOGRAPHY HEAD AND NECK  TECHNIQUE: Multidetector CT imaging of the head and neck was performed using the standard protocol during bolus administration of intravenous contrast. Multiplanar CT image reconstructions including MIPs were obtained to evaluate the vascular anatomy. Carotid stenosis measurements (when applicable) are obtained utilizing NASCET criteria, using the distal internal carotid diameter as the denominator.  CONTRAST:  80mL OMNIPAQUE IOHEXOL 350 MG/ML SOLN  COMPARISON:  CT head 02/18/2013  FINDINGS: CTA HEAD FINDINGS  Mild to moderate atrophy. Mild to moderate chronic microvascular ischemic change in the white matter. Small well-defined hypodensity right medial thalamus unchanged from 2012 and consistent with chronic infarct. Negative for acute infarct. Negative for hemorrhage or mass lesion.  Extensive intracranial atherosclerotic disease with calcified cavernous carotid and vertebral arteries.  Moderate stenosis right cavernous carotid with extensive atherosclerotic calcification. Fusiform dilatation of the supra clinoid internal carotid artery on the right measuring 5.4 mm in diameter. This is most likely due to atherosclerotic disease. Right anterior and right middle cerebral arteries are patent without significant stenosis.  Diffuse atherosclerotic disease left cavernous carotid which is calcified and mildly dilated diffusely due to  atherosclerotic disease. Mild stenosis. Left anterior and middle cerebral arteries are patent bilaterally.  Atherosclerotic calcification in the distal vertebral arteries. The basilar is widely patent. Posterior inferior cerebellar artery is patent bilaterally. Superior cerebellar and posterior cerebral arteries are patent bilaterally. Fetal origin of the right posterior cerebral artery with hypoplastic right P1 segment.  Review of the MIP images confirms the above findings.  CTA NECK  FINDINGS  Negative for mass or adenopathy in the neck.  Lung apices are clear.  Extensive atherosclerotic disease. Atherosclerotic aorta. Three vessel aortic arch. Proximal great vessels are patent.  Right common carotid artery is patent. There is atherosclerotic calcified and noncalcified plaque the distal common carotid artery as well as the proximal internal and external carotid arteries. No significant stenosis is identified. The plaque is irregular and plaque ulceration as a source of emboli cannot be excluded.  Left carotid: Left common carotid artery is widely patent. Atherosclerotic thickening of the carotid bifurcation with small areas of calcification. No significant left carotid stenosis. Negative for dissection.  Vertebral arteries: Both vertebral arteries are patent to the basilar. Atherosclerotic calcification and mild to moderate stenosis of the distal vertebral artery bilaterally.  Review of the MIP images confirms the above findings.  IMPRESSION: Diffuse atherosclerotic disease  Moderate stenosis of the right cavernous carotid with heavily calcified plaque. There is fusiform dilatation of the right supra clinoid internal carotid artery compatible with atherosclerotic disease.  Mild to moderate atherosclerotic stenosis of the left cavernous carotid.  Calcified plaque with mild to moderate stenosis distal vertebral artery bilaterally  Diffuse atherosclerotic irregularity and plaque formation of the distal right common carotid artery and proximal internal carotid artery without significant stenosis. Plaque ulceration is present and is a source of emboli.  Atherosclerotic disease in the left carotid bifurcation without hemodynamically significant stenosis.   Electronically Signed   By: Marlan Palau M.D.   On: 02/19/2013 08:40    Scheduled Meds: . allopurinol  50 mg Oral q morning - 10a  . apixaban  2.5 mg Oral BID  . ezetimibe  10 mg Oral Daily  . irbesartan  300 mg Oral QHS  . levothyroxine   75 mcg Oral QAC breakfast  . metoprolol  100 mg Oral BID  . potassium chloride  10 mEq Oral BID   Continuous Infusions: . sodium chloride 75 mL/hr at 02/19/13 0212    Active Problems:   Hyponatremia   Atrial fibrillation   HTN (hypertension)   Congestive heart failure, unspecified   Type II or unspecified type diabetes mellitus without mention of complication, not stated as uncontrolled   Coronary atherosclerosis of unspecified type of vessel, native or graft   CVA (cerebral infarction)  Time spent:  CHIU, STEPHEN K  [facility] Pager [phone]. If 7PM-7AM, please contact night-coverage at [email or web address], password [facility] 02/20/2013, 11:53 AM  LOS: 2 days

## 2013-02-20 NOTE — Clinical Social Work Psychosocial (Signed)
Clinical Social Work Department BRIEF PSYCHOSOCIAL ASSESSMENT 02/20/2013  Patient:  Jon [name], Jon [name]     Account Number:  [id number]     Admit date:  02/18/2013  Clinical Social Worker:  Sherre Lain  Date/Time:  02/20/2013 04:03 PM  Referred by:  Physician  Date Referred:  02/20/2013 Referred for  SNF Placement   Other Referral:   none.   Interview type:  Family Other interview type:   CSW spoke to pt's daughter and admissions coordinator via phone regarding pt's care.    PSYCHOSOCIAL DATA Living Status:  FACILITY Admitted from facility:  [facility] Level of care:  Assisted Living Primary support name:  Cranston Neighbor Primary support relationship to patient:  CHILD, ADULT Degree of support available:   Strong support system.    CURRENT CONCERNS Current Concerns  Post-Acute Placement   Other Concerns:   none.    SOCIAL WORK ASSESSMENT / PLAN CSW received call from pt's daughter and admissions coordinator at [facility], stating that pt and his wife are current tenants at their ALF. Pt's daughter informed CSW that her and her family would like for pt to be discharged to the SNF portion of [facility] on 02/21/2013. Admissions coordinator stated that [facility] would have a SNF bed available for pt on 02/21/2013. CSW to send clinical information to [facility] and assist with discharge planning needs.   Assessment/plan status:  Psychosocial Support/Ongoing Assessment of Needs Other assessment/ plan:   none.   Information/referral to community resources:   Pt returning to [facility].    PATIENTS/FAMILYS RESPONSE TO PLAN OF CARE: Pt's family is understanding and agreeable to CSW plan of care.       Darlyn Chamber, LCSWA Clinical Social Worker [phone]

## 2013-02-20 NOTE — Progress Notes (Signed)
Echocardiogram 2D Echocardiogram has been performed.  Jon [name] 02/20/2013, 4:02 PM

## 2013-02-20 NOTE — Evaluation (Signed)
Occupational Therapy Evaluation Patient Details Name: Jon [name] MRN: [id number] DOB: 19-May-1921 Today's Date: 02/20/2013 Time: 4098-1191 OT Time Calculation (min): 33 min  OT Assessment / Plan / Recommendation History of present illness  [name] is a 79 y.o. male with a history of atrial fibrillation who is on Coumadin who presents with right-sided incoordination and dysarthria that started around 3 PM. He states that he is hoping things or any get better, but instead they got worse and therefore he is now seeking medical attention. He has not missed any doses of his Coumadin. His daughter confirms that the right-sided discoordination as well as the slurred speech are new.   Clinical Impression   Pt presents with below problem list. Pt will benefit from acute OT to increase independence prior to d/c. Daughter present during session. Educated to have MD check vision and discuss driving prior to d/c.     OT Assessment  Patient needs continued OT Services    Follow Up Recommendations  SNF;Supervision/Assistance - 24 hour    Barriers to Discharge      Equipment Recommendations  Other (comment) (defer to next venue)    Recommendations for Other Services    Frequency  Min 2X/week    Precautions / Restrictions Precautions Precautions: Fall   Pertinent Vitals/Pain No pain reported.     ADL  Eating/Feeding: Set up;Supervision/safety Where Assessed - Eating/Feeding: Chair Upper Body Dressing: Minimal assistance Where Assessed - Upper Body Dressing: Supported sitting Lower Body Dressing: Minimal assistance Where Assessed - Lower Body Dressing: Supported sit to stand Toilet Transfer: Hydrographic surveyor Method: Sit to Barista: Regular height toilet;Grab bars Tub/Shower Transfer Method: Not assessed Equipment Used: Gait belt;Rolling walker;Other (comment) (theraputty) Transfers/Ambulation Related to ADLs: Min guard for ambulation and Min  guard/Min A for transfers. ADL Comments: Educated to sit to bathe and sit to get LB clothing over feet. Educated to stand in front of bed/chair with walker in front when pulling up LB clothing with walker in front. Educated on fine motor coordination activities as well as theraputty activities for Rt hand.  Pt reports difficulty with buttons. Educated to be using Rt hand for activities.     OT Diagnosis: Generalized weakness;Disturbance of vision  OT Problem List: Decreased strength;Decreased activity tolerance;Decreased coordination;Impaired balance (sitting and/or standing);Impaired vision/perception;Decreased knowledge of use of DME or AE;Decreased knowledge of precautions;Impaired UE functional use OT Treatment Interventions: Self-care/ADL training;DME and/or AE instruction;Therapeutic activities;Visual/perceptual remediation/compensation;Patient/family education;Balance training;Therapeutic exercise;Neuromuscular education   OT Goals(Current goals can be found in the care plan section) Acute Rehab OT Goals Patient Stated Goal: not stated OT Goal Formulation: With patient Time For Goal Achievement: 02/27/13 Potential to Achieve Goals: Good ADL Goals Pt Will Perform Grooming: with modified independence;standing Pt Will Perform Lower Body Bathing: with modified independence;sit to/from stand Pt Will Perform Lower Body Dressing: with modified independence;sit to/from stand Pt Will Transfer to Toilet: with modified independence;ambulating;regular height toilet;grab bars Pt Will Perform Toileting - Clothing Manipulation and hygiene: with modified independence;sit to/from stand Additional ADL Goal #1: Pt will be independent in HEP for RUE to increase strength and coordination.   Visit Information  Last OT Received On: 02/20/13 Assistance Needed: +1 History of Present Illness:  Jon [name] is a 79 y.o. male with a history of atrial fibrillation who is on Coumadin who presents with right-sided  incoordination and dysarthria that started around 3 PM. He states that he is hoping things or any get better, but instead they got worse  and therefore he is now seeking medical attention. He has not missed any doses of his Coumadin. His daughter confirms that the right-sided discoordination as well as the slurred speech are new.       Prior Functioning     Home Living Family/patient expects to be discharged to:: Other (Comment) (Independent Living-[facility]) Living Arrangements: Spouse/significant other Available Help at Discharge: Family;Available PRN/intermittently Type of Home: Independent living facility Home Access: Level entry Home Layout: One level Home Equipment: Walker - 4 wheels;Cane - quad;Bedside commode;Grab bars - toilet;Grab bars - tub/shower  Lives With: Spouse Prior Function Level of Independence: Independent with assistive device(s) Comments: usually cane for community, rollator in ILF Communication Communication: Other (comment) (slurred speech) Dominant Hand: Right         Vision/Perception Vision - History Baseline Vision: Wears glasses all the time Vision - Assessment Vision Assessment: Vision tested Tracking/Visual Pursuits: Other (comment) (diffculty tracking to both Right and Left) Visual Fields: No apparent deficits   Cognition  Cognition Arousal/Alertness: Awake/alert Behavior During Therapy: WFL for tasks assessed/performed Overall Cognitive Status: Within Functional Limits for tasks assessed    Extremity/Trunk Assessment Upper Extremity Assessment Upper Extremity Assessment: RUE deficits/detail RUE Deficits / Details: weakness in RUE RUE Coordination: decreased fine motor Lower Extremity Assessment Lower Extremity Assessment: Defer to PT evaluation     Mobility Bed Mobility Bed Mobility: Not assessed Transfers Transfers: Sit to Stand;Stand to Sit Sit to Stand: 4: Min guard; With upper extremity assist;From chair/3-in-1;From  toilet Stand to Sit: 4: Min assist;4: Min guard;With upper extremity assist;To chair/3-in-1;To toilet Details for Transfer Assistance: Min A to help control descent to recliner chair. Cues for hand placement. Recommended using grab bar in bathroom to help control descent.            End of Session OT - End of Session Equipment Utilized During Treatment: Gait belt;Rolling walker Activity Tolerance: Patient tolerated treatment well Patient left: in chair;with call bell/phone within reach;with family/visitor present  GO     Earlie Raveling OTR/L [phone] 02/20/2013, 1:19 PM

## 2013-02-20 NOTE — Discharge Summary (Addendum)
Physician Discharge Summary  Jon [name] ZOX:[id number] DOB: [birth year] DOA: 02/18/2013  PCP: Kimber Relic, MD  Admit date: 02/18/2013 Discharge date: 02/21/13  Time spent: 35 minutes  Recommendations for Outpatient Follow-up:  1. Follow up with Dr. Pearlean Brownie in 2 months 2. Follow up with PCP in 1-2 weeks  Discharge Diagnoses:  Active Problems:   Hyponatremia   Atrial fibrillation   HTN (hypertension)   Congestive heart failure, unspecified   Type II or unspecified type diabetes mellitus without mention of complication, not stated as uncontrolled   Coronary atherosclerosis of unspecified type of vessel, native or graft   CVA (cerebral infarction)   Discharge Condition: Stable  Diet recommendation: heart healthy  Filed Weights   02/18/13 2008 02/19/13 0204  Weight: 82.464 kg (181 lb 12.8 oz) 72.712 kg (160 lb 4.8 oz)    History of present illness:  Jon [name] is a 79 y.o. male  has a past medical history of Hypertension; Coronary artery disease; Arthritis; Cataract; Anxiety; Blood transfusion without reported diagnosis; Hyperlipidemia; Heart murmur; Allergy; Thyroid disease; PAF (paroxysmal atrial fibrillation); History of abdominal aortic aneurysm; History of echocardiogram (12/2008); History of nuclear stress test (12/2010); History of Doppler ultrasound (05/22/2012); and History of Doppler ultrasound (05/22/2012).  Presented with  Around 3 Pm he felt weak with some trouble moving right leg. His family wanted to take him out to dinner and during that time he started to have slurred speech. He was brought to ER and was found to have small right thalamic CVA on CT scan Patient is on coumadin for hx of A.fib. He was not a candidate for tPA.  Hospitaltist called for admission   Hospital Course:  CVA  -Neurology consulted  - unable to obtain MRA/MRI DUE to pacemaker,  - Echo pending  - PT/OT evaluation.  - On apixiban 2.5mg  bid  - Cleared for discharge by  Neurology, however, pt will continue with hospital stay because medicare requires patient to remain admitted for 3 days even though patient is medically stable for discharge  Atrial fibrillation - continue apixiban, currently rate controlled   Congestive heart failure, unspecified - stable, remains euvolemic   HTN (hypertension) - continue home medication   Hyponatremia - obtain urine electrolytes this is chronic and stable   Coronary atherosclerosis of unspecified type of vessel, native or graft - currently no chest pain continue home medications   Type II or unspecified type diabetes mellitus without mention of complication, not stated as uncontrolled - sensitive scale hold by mouth meds  Procedures:  2D echo   Consultations:  Neurology  Discharge Exam: Filed Vitals:   02/19/13 2230 02/20/13 0147 02/20/13 0635 02/20/13 1104  BP: 170/65 179/63 171/61 178/58  Pulse: 61 62 61 60  Temp: 97.5 F (36.4 C) 97.3 F (36.3 C) 97.4 F (36.3 C) 98 F (36.7 C)  TempSrc: Oral Oral Oral Oral  Resp: 20 18 18 18   Height:      Weight:      SpO2: 95% 96% 98% 100%    General: Awake, in nad Cardiovascular: regular, s1, s2 Respiratory: normal resp effort, no wheezing  Discharge Instructions   Future Appointments Provider Department Dept Phone   03/15/2013 11:00 AM Phillips Hay, RPH-CPP [facility] [phone]   04/10/2013 9:15 AM [facility] [place] Office [phone]   04/11/2013 4:00 PM Kimber Relic, MD [facility] [phone]   02/26/2014 12:45 PM Sherrie George, MD [facility] [phone]       Medication List    ASK your doctor about these medications       allopurinol 100 MG tablet  Commonly known as:  ZYLOPRIM  Take 50 mg by mouth every morning.     amoxicillin 500 MG tablet  Commonly known as:  AMOXIL  Take 500 mg by mouth. Take four tablets one hour prior to dental procedures.      aspirin EC 81 MG tablet  Take 81 mg by mouth daily.     cetaphil lotion  Apply 1 application topically daily as needed. Compounded with triamcinolone cream 1% (1:3 ratio).  For rash     diphenhydrAMINE 25 MG tablet  Commonly known as:  BENADRYL  Take 25 mg by mouth every 6 (six) hours as needed for itching.     hydrochlorothiazide 25 MG tablet  Commonly known as:  HYDRODIURIL  Take 25 mg by mouth daily.     irbesartan 300 MG tablet  Commonly known as:  AVAPRO  Take 300 mg by mouth at bedtime. Take one tablet daily for blood pressure     levothyroxine 75 MCG tablet  Commonly known as:  SYNTHROID, LEVOTHROID  Take 75 mcg by mouth daily before breakfast.     LORazepam 0.5 MG tablet  Commonly known as:  ATIVAN  Take 0.5 mg by mouth at bedtime as needed for anxiety or sleep.     metoprolol 100 MG tablet  Commonly known as:  LOPRESSOR  Take 100 mg by mouth 2 (two) times daily.     nitroGLYCERIN 0.4 MG SL tablet  Commonly known as:  NITROSTAT  Place 0.4 mg under the tongue every 5 (five) minutes as needed. For chest pain     potassium chloride 10 MEQ CR capsule  Commonly known as:  MICRO-K  Take 10 mEq by mouth 2 (two) times daily.     Vitamin D 400 UNITS capsule  Take 400 Units by mouth 2 (two) times daily.     warfarin 2.5 MG tablet  Commonly known as:  COUMADIN  Take 2.5 mg by mouth daily.     zolpidem 10 MG tablet  Commonly known as:  AMBIEN  Take 2.5-5 mg by mouth at bedtime as needed for sleep.       Allergies  Allergen Reactions  . Caduet [Amlodipine-Atorvastatin]     Weakness   . Crestor [Rosuvastatin]   . Cymbalta [Duloxetine Hcl] Other (See Comments)    Excessive sedation  . Lipitor [Atorvastatin Calcium]     Muscle pain  . Lisinopril   . Simvastatin     Muscle pain       Follow-up Information   Follow up with Gates Rigg, MD. Schedule an appointment as soon as possible for a visit in 2 months. (stroke clinic)    Specialties:  Neurology,  Radiology   Contact information:   [address] [place] [address] [phone]        The results of significant diagnostics from this hospitalization (including imaging, microbiology, ancillary and laboratory) are listed below for reference.    Significant Diagnostic Studies: Ct Angio Head W/cm &/or Wo Cm  02/19/2013   CLINICAL DATA:  Slurred speech.  Stroke.  EXAM: CT ANGIOGRAPHY HEAD AND NECK  TECHNIQUE: Multidetector CT imaging of the head and neck was performed using the standard protocol during bolus administration of intravenous contrast. Multiplanar CT image reconstructions including MIPs were obtained to evaluate the vascular anatomy. Carotid stenosis  measurements (when applicable) are obtained utilizing NASCET criteria, using the distal internal carotid diameter as the denominator.  CONTRAST:  80mL OMNIPAQUE IOHEXOL 350 MG/ML SOLN  COMPARISON:  CT head 02/18/2013  FINDINGS: CTA HEAD FINDINGS  Mild to moderate atrophy. Mild to moderate chronic microvascular ischemic change in the white matter. Small well-defined hypodensity right medial thalamus unchanged from 2012 and consistent with chronic infarct. Negative for acute infarct. Negative for hemorrhage or mass lesion.  Extensive intracranial atherosclerotic disease with calcified cavernous carotid and vertebral arteries.  Moderate stenosis right cavernous carotid with extensive atherosclerotic calcification. Fusiform dilatation of the supra clinoid internal carotid artery on the right measuring 5.4 mm in diameter. This is most likely due to atherosclerotic disease. Right anterior and right middle cerebral arteries are patent without significant stenosis.  Diffuse atherosclerotic disease left cavernous carotid which is calcified and mildly dilated diffusely due to atherosclerotic disease. Mild stenosis. Left anterior and middle cerebral arteries are patent bilaterally.  Atherosclerotic calcification in the distal vertebral  arteries. The basilar is widely patent. Posterior inferior cerebellar artery is patent bilaterally. Superior cerebellar and posterior cerebral arteries are patent bilaterally. Fetal origin of the right posterior cerebral artery with hypoplastic right P1 segment.  Review of the MIP images confirms the above findings.  CTA NECK FINDINGS  Negative for mass or adenopathy in the neck.  Lung apices are clear.  Extensive atherosclerotic disease. Atherosclerotic aorta. Three vessel aortic arch. Proximal great vessels are patent.  Right common carotid artery is patent. There is atherosclerotic calcified and noncalcified plaque the distal common carotid artery as well as the proximal internal and external carotid arteries. No significant stenosis is identified. The plaque is irregular and plaque ulceration as a source of emboli cannot be excluded.  Left carotid: Left common carotid artery is widely patent. Atherosclerotic thickening of the carotid bifurcation with small areas of calcification. No significant left carotid stenosis. Negative for dissection.  Vertebral arteries: Both vertebral arteries are patent to the basilar. Atherosclerotic calcification and mild to moderate stenosis of the distal vertebral artery bilaterally.  Review of the MIP images confirms the above findings.  IMPRESSION: Diffuse atherosclerotic disease  Moderate stenosis of the right cavernous carotid with heavily calcified plaque. There is fusiform dilatation of the right supra clinoid internal carotid artery compatible with atherosclerotic disease.  Mild to moderate atherosclerotic stenosis of the left cavernous carotid.  Calcified plaque with mild to moderate stenosis distal vertebral artery bilaterally  Diffuse atherosclerotic irregularity and plaque formation of the distal right common carotid artery and proximal internal carotid artery without significant stenosis. Plaque ulceration is present and is a source of emboli.  Atherosclerotic disease  in the left carotid bifurcation without hemodynamically significant stenosis.   Electronically Signed   By: Marlan Palau M.D.   On: 02/19/2013 08:40   Dg Chest 2 View  02/19/2013   CLINICAL DATA:  History CVA  EXAM: CHEST  2 VIEW  COMPARISON:  11/08/2012  FINDINGS: Slightly low lung volumes. There is mild elevation right hemidiaphragm. Patient is status post median sternotomy and coronary artery bypass grafting. A left-sided chest wall pacing unit is appreciated with lead tips projecting in the region the right atrium and right ventricle. No focal regions of consolidation or focal infiltrates are appreciated. Degenerative changes identified involving the right and left shoulders.  IMPRESSION: Low lung volumes without evidence of acute cardiopulmonary disease.   Electronically Signed   By: Salome Holmes M.D.   On: 02/19/2013 08:30   Ct Head Wo  Contrast  02/19/2013   CLINICAL DATA:  Worsening facial droop and slurred speech.  EXAM: CT HEAD WITHOUT CONTRAST  TECHNIQUE: Contiguous axial images were obtained from the base of the skull through the vertex without intravenous contrast.  COMPARISON:  02/18/2013  FINDINGS: There is no evidence of intracranial hemorrhage, brain edema, or other signs of acute infarction. There is no evidence of intracranial mass lesion or mass effect. No abnormal extraaxial fluid collections are identified.  Mild to moderate chronic small vessel disease is unchanged in appearance. Old right thalamic lacunar infarct is stable in appearance. Small old left posterior cerebellar infarct also unchanged. Ventricles remain normal in size. No skull abnormality identified.  IMPRESSION: No acute intracranial findings.  Stable chronic small vessel disease, old right thalamic lacune, and old left cerebellar infarct.   Electronically Signed   By: Myles Rosenthal M.D.   On: 02/19/2013 21:12   Ct Head (brain) Wo Contrast  02/18/2013   CLINICAL DATA:  Last seen normal at 3 p.m., right-sided  weakness, right facial droop, slurred speech, history hypertension, coronary artery disease  EXAM: CT HEAD WITHOUT CONTRAST  TECHNIQUE: Contiguous axial images were obtained from the base of the skull through the vertex without intravenous contrast.  COMPARISON:  03/04/2011  FINDINGS: Generalized atrophy.  Normal ventricular morphology.  No midline shift or mass effect.  Small vessel chronic ischemic changes of deep cerebral white matter.  Small focus of low attenuation identified right thalamus likely representing a small lacunar infarct, question slightly larger than on previous exam.  No intracranial hemorrhage, mass lesion or evidence of acute cortical infarction.  No extra-axial fluid collections.  Atherosclerotic calcifications of internal carotid arteries and vertebral arteries at skullbase.  No acute bone or sinus abnormalities.  IMPRESSION: Atrophy with small vessel chronic ischemic changes of deep cerebral white matter.  Small lacunar infarct at right thalamus, question slightly larger than on previous exam.  No evidence of cortical infarction or intracranial hemorrhage.  Critical Value/emergent results were called by telephone at the time of interpretation on 02/18/2013 at 8:34 PM to Dr. Amada Jupiter, who verbally acknowledged these results.   Electronically Signed   By: Ulyses Southward M.D.   On: 02/18/2013 20:34   Ct Angio Neck W/cm &/or Wo/cm  02/19/2013   CLINICAL DATA:  Slurred speech.  Stroke.  EXAM: CT ANGIOGRAPHY HEAD AND NECK  TECHNIQUE: Multidetector CT imaging of the head and neck was performed using the standard protocol during bolus administration of intravenous contrast. Multiplanar CT image reconstructions including MIPs were obtained to evaluate the vascular anatomy. Carotid stenosis measurements (when applicable) are obtained utilizing NASCET criteria, using the distal internal carotid diameter as the denominator.  CONTRAST:  80mL OMNIPAQUE IOHEXOL 350 MG/ML SOLN  COMPARISON:  CT head  02/18/2013  FINDINGS: CTA HEAD FINDINGS  Mild to moderate atrophy. Mild to moderate chronic microvascular ischemic change in the white matter. Small well-defined hypodensity right medial thalamus unchanged from 2012 and consistent with chronic infarct. Negative for acute infarct. Negative for hemorrhage or mass lesion.  Extensive intracranial atherosclerotic disease with calcified cavernous carotid and vertebral arteries.  Moderate stenosis right cavernous carotid with extensive atherosclerotic calcification. Fusiform dilatation of the supra clinoid internal carotid artery on the right measuring 5.4 mm in diameter. This is most likely due to atherosclerotic disease. Right anterior and right middle cerebral arteries are patent without significant stenosis.  Diffuse atherosclerotic disease left cavernous carotid which is calcified and mildly dilated diffusely due to atherosclerotic disease. Mild stenosis. Left anterior  and middle cerebral arteries are patent bilaterally.  Atherosclerotic calcification in the distal vertebral arteries. The basilar is widely patent. Posterior inferior cerebellar artery is patent bilaterally. Superior cerebellar and posterior cerebral arteries are patent bilaterally. Fetal origin of the right posterior cerebral artery with hypoplastic right P1 segment.  Review of the MIP images confirms the above findings.  CTA NECK FINDINGS  Negative for mass or adenopathy in the neck.  Lung apices are clear.  Extensive atherosclerotic disease. Atherosclerotic aorta. Three vessel aortic arch. Proximal great vessels are patent.  Right common carotid artery is patent. There is atherosclerotic calcified and noncalcified plaque the distal common carotid artery as well as the proximal internal and external carotid arteries. No significant stenosis is identified. The plaque is irregular and plaque ulceration as a source of emboli cannot be excluded.  Left carotid: Left common carotid artery is widely patent.  Atherosclerotic thickening of the carotid bifurcation with small areas of calcification. No significant left carotid stenosis. Negative for dissection.  Vertebral arteries: Both vertebral arteries are patent to the basilar. Atherosclerotic calcification and mild to moderate stenosis of the distal vertebral artery bilaterally.  Review of the MIP images confirms the above findings.  IMPRESSION: Diffuse atherosclerotic disease  Moderate stenosis of the right cavernous carotid with heavily calcified plaque. There is fusiform dilatation of the right supra clinoid internal carotid artery compatible with atherosclerotic disease.  Mild to moderate atherosclerotic stenosis of the left cavernous carotid.  Calcified plaque with mild to moderate stenosis distal vertebral artery bilaterally  Diffuse atherosclerotic irregularity and plaque formation of the distal right common carotid artery and proximal internal carotid artery without significant stenosis. Plaque ulceration is present and is a source of emboli.  Atherosclerotic disease in the left carotid bifurcation without hemodynamically significant stenosis.   Electronically Signed   By: Marlan Palau M.D.   On: 02/19/2013 08:40    Microbiology: No results found for this or any previous visit (from the past 240 hour(s)).   Labs: Basic Metabolic Panel:  Recent Labs Lab 02/18/13 2025 02/18/13 2032  NA 128* 129*  K 3.5 3.4*  CL 91* 92*  CO2 26  --   GLUCOSE 121* 127*  BUN 19 19  CREATININE 1.12 1.20  CALCIUM 9.0  --    Liver Function Tests:  Recent Labs Lab 02/18/13 2025  AST 23  ALT 16  ALKPHOS 90  BILITOT 0.7  PROT 6.7  ALBUMIN 3.6   No results found for this basename: LIPASE, AMYLASE,  in the last 168 hours No results found for this basename: AMMONIA,  in the last 168 hours CBC:  Recent Labs Lab 02/18/13 2025 02/18/13 2032  WBC 6.9  --   NEUTROABS 3.7  --   HGB 15.5 16.3  HCT 43.1 48.0  MCV 92.3  --   PLT 217  --    Cardiac  Enzymes:  Recent Labs Lab 02/18/13 2025  TROPONINI <0.30   BNP: BNP (last 3 results) No results found for this basename: PROBNP,  in the last 8760 hours CBG:  Recent Labs Lab 02/18/13 2043  GLUCAP 110*   Signed:  Mychelle Kendra K  [facility] 02/20/2013, 11:47 AM

## 2013-02-20 NOTE — Progress Notes (Signed)
Stroke Team Progress Note  HISTORY  [name] is a 79 y.o. male with a history of atrial fibrillation who is on Coumadin who presents with right-sided incoordination and dysarthria that started around 3 PM. He states that he is hoping things or any get better, but instead they got worse and therefore he is now seeking medical attention. He has not missed any doses of his Coumadin. His daughter confirms that the right-sided discoordination as well as the slurred speech are new.  Patient was not a TPA candidate secondary to outside window. He was admitted to the neuro for further evaluation and treatment.  SUBJECTIVE His daughter is at the bedside.  Patient sitting up in gerichair.  OBJECTIVE Most recent Vital Signs: Filed Vitals:   02/19/13 2230 02/20/13 0147 02/20/13 0635 02/20/13 1104  BP: 170/65 179/63 171/61 178/58  Pulse: 61 62 61 60  Temp: 97.5 F (36.4 C) 97.3 F (36.3 C) 97.4 F (36.3 C) 98 F (36.7 C)  TempSrc: Oral Oral Oral Oral  Resp: 20 18 18 18   Height:      Weight:      SpO2: 95% 96% 98% 100%   CBG (last 3)   Recent Labs  02/18/13 2043  GLUCAP 110*    IV Fluid Intake:   . sodium chloride 75 mL/hr at 02/19/13 0212    MEDICATIONS  . allopurinol  50 mg Oral q morning - 10a  . apixaban  2.5 mg Oral BID  . aspirin  300 mg Rectal Daily   Or  . aspirin  325 mg Oral Daily  . irbesartan  300 mg Oral QHS  . levothyroxine  75 mcg Oral QAC breakfast  . metoprolol  100 mg Oral BID  . potassium chloride  10 mEq Oral BID   PRN:  acetaminophen, acetaminophen, LORazepam, senna-docusate  Diet:  Cardiac Thin liquids Activity:  Up with assistance DVT Prophylaxis:  On apixaban  CLINICALLY SIGNIFICANT STUDIES Basic Metabolic Panel:   Recent Labs Lab 02/18/13 2025 02/18/13 2032  NA 128* 129*  K 3.5 3.4*  CL 91* 92*  CO2 26  --   GLUCOSE 121* 127*  BUN 19 19  CREATININE 1.12 1.20  CALCIUM 9.0  --    Liver Function Tests:   Recent Labs Lab  02/18/13 2025  AST 23  ALT 16  ALKPHOS 90  BILITOT 0.7  PROT 6.7  ALBUMIN 3.6   CBC:   Recent Labs Lab 02/18/13 2025 02/18/13 2032  WBC 6.9  --   NEUTROABS 3.7  --   HGB 15.5 16.3  HCT 43.1 48.0  MCV 92.3  --   PLT 217  --    Coagulation:   Recent Labs Lab 02/15/13 1120 02/18/13 2025 02/19/13 0355  LABPROT  --  22.2* 21.9*  INR 1.9 2.02* 1.98*   Cardiac Enzymes:   Recent Labs Lab 02/18/13 2025  TROPONINI <0.30   Urinalysis:   Recent Labs Lab 02/18/13 2046  COLORURINE YELLOW  LABSPEC 1.010  PHURINE 6.5  GLUCOSEU NEGATIVE  HGBUR TRACE*  BILIRUBINUR NEGATIVE  KETONESUR NEGATIVE  PROTEINUR NEGATIVE  UROBILINOGEN 0.2  NITRITE NEGATIVE  LEUKOCYTESUR NEGATIVE   Lipid Panel    Component Value Date/Time   CHOL 187 02/19/2013 0355   TRIG 181* 02/19/2013 0355   HDL 34* 02/19/2013 0355   CHOLHDL 5.5 02/19/2013 0355   VLDL 36 02/19/2013 0355   LDLCALC 117* 02/19/2013 0355   HgbA1C  Lab Results  Component Value Date   HGBA1C 6.0*  02/19/2013    Urine Drug Screen:     Component Value Date/Time   LABOPIA NONE DETECTED 02/18/2013 2046   COCAINSCRNUR NONE DETECTED 02/18/2013 2046   LABBENZ NONE DETECTED 02/18/2013 2046   AMPHETMU NONE DETECTED 02/18/2013 2046   THCU NONE DETECTED 02/18/2013 2046   LABBARB NONE DETECTED 02/18/2013 2046    Alcohol Level: No results found for this basename: ETH,  in the last 168 hours   CT of the brain   02/19/2013  No acute intracranial findings.  Stable chronic small vessel disease, old right thalamic lacune, and old left cerebellar infarct.    02/18/2013    Atrophy with small vessel chronic ischemic changes of deep cerebral white matter.  Small lacunar infarct at right thalamus, question slightly larger than on previous exam.  No evidence of cortical infarction or intracranial hemorrhage.   CT Angio of Head and Neck  02/19/2013    Diffuse atherosclerotic disease  Moderate stenosis of the right cavernous carotid  with heavily calcified plaque. There is fusiform dilatation of the right supra clinoid internal carotid artery compatible with atherosclerotic disease.  Mild to moderate atherosclerotic stenosis of the left cavernous carotid.  Calcified plaque with mild to moderate stenosis distal vertebral artery bilaterally  Diffuse atherosclerotic irregularity and plaque formation of the distal right common carotid artery and proximal internal carotid artery without significant stenosis. Plaque ulceration is present and is a source of emboli.  Atherosclerotic disease in the left carotid bifurcation without hemodynamically significant stenosis.     MRI/A of the brain  NA (pacer)  2D Echocardiogram  pending  Carotid Doppler  Bilateral: 1-39% ICA stenosis. Left vertebral artery flow is antegrade. Right vertebral flow is demonstrating atypical waveforms. Possible proximal obstruction.  CXR  02/19/2013   Low lung volumes without evidence of acute cardiopulmonary disease.    EKG   dual paced.   Therapy Recommendations pending Filed Vitals:   02/20/13 1104  BP: 178/58  Pulse: 60  Temp: 98 F (36.7 C)  Resp: 18    Physical Exam   General: Well-developed, well-nourished, elderly White male, in no acute distress; Head: Normocephalic, atraumatic. Eyes: PERRLA, EOMI Lungs: Normal respiratory effort Heart: paced Abdomen: benign Extremities: No pretibial edema Neurologic: alert and oriented x3, appropriate and cooperative throughout examination. Mental Status: Alert, oriented, thought content appropriate.  Speech fluent without evidence of aphasia.  Able to follow 3 step commands without difficulty. Cranial Nerves: II: Discs flat bilaterally; Visual fields grossly normal, pupils equal, round, reactive to light and accommodation III,IV, VI: ptosis not present, extra-ocular motions intact bilaterally V,VII: mild right facial droop, facial light touch sensation normal bilaterally VIII: hearing grossly normal  bilaterally, normally wears hearing aids XII: midline tongue extension without atrophy or fasciculations Motor: Right : Upper extremity   5/5    Left:     Upper extremity   5/5  Lower extremity   5/5     Lower extremity   5/5 Tone and bulk:normal tone throughout; no atrophy noted Sensory: light touch intact throughout, bilaterally Cerebellar:normal finger-to-nose    ASSESSMENT Mr. Jon [name] is a 79 y.o. male presenting with dysmetria and dysarthria. Imaging without acute infarct. MRI not obtained secondary to pacemaker. Dx:  Brainstem too small to be seen on CT; old right thalamic lacune and old left cerebellar infarct.  Infarct felt to be embolic secondary to atrial fibrillation.  On warfarin prior to admission. Now on apixiban for secondary stroke prevention. Patient with resolved symptoms for the most part.  Work up completed.   TIA vs acute CVA likely embolic from atrial fibrillation  Atrial fibrillation  S/p pace maker  Hypertension  CHF  DM Type 2, well controlled HgbA1c 6  CAD  Hyperlipidemia, LDL 117, on no statin PTA due to muscle pain/joint pain (has tried crestor, lipitior, and additional ones that pt cannot remember), now on no statin, goal LDL < 100 (< 70 for diabetics). Not a statin candidate due to intolerance. Will try zetia.  Hospital day # 2  TREATMENT/PLAN  Continue  apixiban 2.5 mg bid for secondary stroke prevention.  F/u 2D ECHO. No need to stay in the hospital until resulted from neuro standpoint.  zetia 10 mg daily No further stroke workup indicated. Patient has a 10-15% risk of having another stroke over the next year, the highest risk is within 2 weeks of the most recent stroke/TIA (risk of having a stroke following a stroke or TIA is the same). Ongoing risk factor control by Primary Care Physician Stroke Service will sign off. Please call should any needs arise. Follow up with Dr. Pearlean Brownie, Stroke Clinic, in 2 months.   Annie Main, MSN,  RN, ANVP-BC, ANP-BC, [facility] Pager: [phone] 02/20/2013 11:15 AM  I have personally obtained a history, examined the patient, evaluated imaging results, and formulated the assessment and plan of care. I agree with the above. Delia Heady, MD

## 2013-02-20 NOTE — Clinical Social Work Placement (Signed)
Clinical Social Work Department CLINICAL SOCIAL WORK PLACEMENT NOTE 02/20/2013  Patient:  Jon [name], Jon [name]  Account Number:  [id number] Admit date:  02/18/2013  Clinical Social Worker:  Irving Burton [place], LCSWA  Date/time:  02/20/2013 04:20 PM  Clinical Social Work is seeking post-discharge placement for this patient at the following level of care:   SKILLED NURSING   (*CSW will update this form in Epic as items are completed)   02/20/2013  Patient/family provided with [facility] Department of Clinical Social Works list of facilities offering this level of care within the geographic area requested by the patient (or if unable, by the patients family).  02/20/2013  Patient/family informed of their freedom to choose among providers that offer the needed level of care, that participate in Medicare, Medicaid or managed care program needed by the patient, have an available bed and are willing to accept the patient.  02/20/2013  Patient/family informed of [facility] ownership interest in [facility], as well as of the fact that they are under no obligation to receive care at this facility.  PASARR submitted to EDS on 02/20/2013 PASARR number received from EDS on 02/20/2013  FL2 transmitted to all facilities in geographic area requested by pt/family on  02/20/2013 FL2 transmitted to all facilities within larger geographic area on   Patient informed that his/her managed care company has contracts with or will negotiate with  certain facilities, including the following:     Patient/family informed of bed offers received:  02/20/2013 Patient chooses bed at [facility] Physician recommends and patient chooses bed at  [facility] AT [place]  Patient to be transferred to [facility] AT [place] on  02/21/2013 Patient to be transferred to facility by [facility]  The following physician request were entered in Epic:   Additional Comments:  Darlyn Chamber,  Theresia Majors Clinical Social Worker [phone]

## 2013-02-21 DIAGNOSIS — J309 Allergic rhinitis, unspecified: Secondary | ICD-10-CM | POA: Diagnosis not present

## 2013-02-21 DIAGNOSIS — I719 Aortic aneurysm of unspecified site, without rupture: Secondary | ICD-10-CM | POA: Diagnosis not present

## 2013-02-21 DIAGNOSIS — I4891 Unspecified atrial fibrillation: Secondary | ICD-10-CM | POA: Diagnosis not present

## 2013-02-21 DIAGNOSIS — I6350 Cerebral infarction due to unspecified occlusion or stenosis of unspecified cerebral artery: Secondary | ICD-10-CM | POA: Diagnosis not present

## 2013-02-21 NOTE — Clinical Social Work Note (Signed)
Per [facility] admission coordinator Marliss Czar), pt is able to be admitted to [facility] once pt is medically stable for discharge. Pt's family requesting ambulance transportation for pt. CSW to continue to follow and assist with discharge planning needs.  Darlyn Chamber, LCSWA Clinical Social Worker [phone]

## 2013-02-22 DIAGNOSIS — I509 Heart failure, unspecified: Secondary | ICD-10-CM | POA: Diagnosis not present

## 2013-02-22 DIAGNOSIS — R1312 Dysphagia, oropharyngeal phase: Secondary | ICD-10-CM | POA: Diagnosis not present

## 2013-02-22 DIAGNOSIS — I629 Nontraumatic intracranial hemorrhage, unspecified: Secondary | ICD-10-CM | POA: Diagnosis not present

## 2013-02-22 DIAGNOSIS — I6350 Cerebral infarction due to unspecified occlusion or stenosis of unspecified cerebral artery: Secondary | ICD-10-CM | POA: Diagnosis not present

## 2013-02-22 DIAGNOSIS — M6281 Muscle weakness (generalized): Secondary | ICD-10-CM | POA: Diagnosis not present

## 2013-02-22 DIAGNOSIS — I4891 Unspecified atrial fibrillation: Secondary | ICD-10-CM | POA: Diagnosis not present

## 2013-02-22 DIAGNOSIS — E039 Hypothyroidism, unspecified: Secondary | ICD-10-CM

## 2013-02-22 DIAGNOSIS — R269 Unspecified abnormalities of gait and mobility: Secondary | ICD-10-CM | POA: Diagnosis not present

## 2013-02-22 DIAGNOSIS — I10 Essential (primary) hypertension: Secondary | ICD-10-CM | POA: Diagnosis not present

## 2013-02-22 DIAGNOSIS — E871 Hypo-osmolality and hyponatremia: Secondary | ICD-10-CM | POA: Diagnosis not present

## 2013-02-22 DIAGNOSIS — R488 Other symbolic dysfunctions: Secondary | ICD-10-CM | POA: Diagnosis not present

## 2013-02-22 DIAGNOSIS — I69959 Hemiplegia and hemiparesis following unspecified cerebrovascular disease affecting unspecified side: Secondary | ICD-10-CM | POA: Diagnosis not present

## 2013-02-22 DIAGNOSIS — M109 Gout, unspecified: Secondary | ICD-10-CM | POA: Diagnosis not present

## 2013-02-22 DIAGNOSIS — J309 Allergic rhinitis, unspecified: Secondary | ICD-10-CM | POA: Diagnosis not present

## 2013-02-22 DIAGNOSIS — I2510 Atherosclerotic heart disease of native coronary artery without angina pectoris: Secondary | ICD-10-CM | POA: Diagnosis not present

## 2013-02-22 DIAGNOSIS — R29818 Other symptoms and signs involving the nervous system: Secondary | ICD-10-CM | POA: Diagnosis not present

## 2013-02-22 LAB — BASIC METABOLIC PANEL
BUN: 14 mg/dL (ref 6–23)
CO2: 23 mEq/L (ref 19–32)
Chloride: 97 mEq/L (ref 96–112)
Creatinine, Ser: 0.74 mg/dL (ref 0.50–1.35)
GFR calc Af Amer: >90 mL/min (ref >90)
Potassium: 4 mEq/L (ref 3.5–5.1)

## 2013-02-22 MED ORDER — LORAZEPAM 0.5 MG PO TABS: 0.5000 mg | ORAL_TABLET | Freq: Every evening | ORAL | Status: DC | PRN

## 2013-02-22 MED ORDER — APIXABAN 2.5 MG PO TABS: 2.5000 mg | ORAL_TABLET | Freq: Two times a day (BID) | ORAL | Status: DC

## 2013-02-22 MED ORDER — EZETIMIBE 10 MG PO TABS: 10.0000 mg | ORAL_TABLET | Freq: Every day | ORAL | Status: DC

## 2013-02-22 MED ORDER — HYDROCHLOROTHIAZIDE 25 MG PO TABS
25.0000 mg | ORAL_TABLET | Freq: Every day | ORAL | Status: DC
  Administered 2013-02-22: 25 mg via ORAL
  Filled 2013-02-22: qty 1

## 2013-02-22 NOTE — Progress Notes (Signed)
Physical Therapy Treatment Patient Details Name: Jerritt Cardoza MRN: [id number] DOB: 09/29/1921 Today's Date: 02/22/2013 Time: 1034-1100 PT Time Calculation (min): 26 min  PT Assessment / Plan / Recommendation  History of Present Illness  Danuel Felicetti is a 79 y.o. male with a history of atrial fibrillation who is on Coumadin who presents with right-sided incoordination and dysarthria that started around 3 PM. He states that he is hoping things or any get better, but instead they got worse and therefore he is now seeking medical attention. He has not missed any doses of his Coumadin. His daughter confirms that the right-sided discoordination as well as the slurred speech are new.   PT Comments   Patient very agreeable to ambulation this morning. Stated his knee was feeling better and he has no complaints of lightheadedness at this time. Patient stated that he had walked the unit 3 times yesterday with nursing staff and he is eager to DC home today.   Follow Up Recommendations  Home health PT;Supervision/Assistance - 24 hour     Does the patient have the potential to tolerate intense rehabilitation     Barriers to Discharge        Equipment Recommendations  None recommended by PT    Recommendations for Other Services    Frequency Min 4X/week   Progress towards PT Goals Progress towards PT goals: Progressing toward goals  Plan Current plan remains appropriate    Precautions / Restrictions Precautions Precautions: Fall   Pertinent Vitals/Pain Slight pain in knee but improved from previous session    Mobility  Bed Mobility Supine to Sit: 6: Modified independent (Device/Increase time) Sitting - Scoot to Edge of Bed: 6: Modified independent (Device/Increase time) Transfers Sit to Stand: With upper extremity assist;5: Supervision;From bed Stand to Sit: 4: Min assist;To chair/3-in-1;To toilet;5: Supervision Details for Transfer Assistance: Cues for hand  placement. Ambulation/Gait Ambulation/Gait Assistance: 4: Min guard Ambulation Distance (Feet): 400 Feet Assistive device: Rolling walker Ambulation/Gait Assistance Details: No LOB, better control of RW and objects to the right Gait Pattern: Step-through pattern Gait velocity: varying    Exercises     PT Diagnosis:    PT Problem List:   PT Treatment Interventions:     PT Goals (current goals can now be found in the care plan section)    Visit Information  Last PT Received On: 02/22/13 Assistance Needed: +1 History of Present Illness:  Arjay Jaskiewicz is a 79 y.o. male with a history of atrial fibrillation who is on Coumadin who presents with right-sided incoordination and dysarthria that started around 3 PM. He states that he is hoping things or any get better, but instead they got worse and therefore he is now seeking medical attention. He has not missed any doses of his Coumadin. His daughter confirms that the right-sided discoordination as well as the slurred speech are new.    Subjective Data      Cognition  Cognition Arousal/Alertness: Awake/alert Behavior During Therapy: WFL for tasks assessed/performed Overall Cognitive Status: Within Functional Limits for tasks assessed    Balance     End of Session PT - End of Session Equipment Utilized During Treatment: Gait belt Activity Tolerance: Patient tolerated treatment well Patient left: in chair;with call bell/phone within reach;with family/visitor present   GP     Robinette, Adline Potter 02/22/2013, 11:05 AM  02/22/2013 Fredrich Birks PTA [phone] pager [phone] office

## 2013-02-22 NOTE — Progress Notes (Signed)
Report given to [facility] nurse Stevan Born, RN.)

## 2013-02-22 NOTE — Progress Notes (Addendum)
ANTICOAGULATION CONSULT NOTE - Follow Up Consult  Pharmacy Consult for apixaban Indication: atrial fibrillation  Allergies  Allergen Reactions  . Caduet [Amlodipine-Atorvastatin]     Weakness   . Crestor [Rosuvastatin]   . Cymbalta [Duloxetine Hcl] Other (See Comments)    Excessive sedation  . Lipitor [Atorvastatin Calcium]     Muscle pain  . Lisinopril   . Simvastatin     Muscle pain    Patient Measurements: Height: 5\' 8"  (172.7 cm) Weight: 160 lb 4.8 oz (72.712 kg) IBW/kg (Calculated) : 68.4  Vital Signs: Temp: 97.8 F (36.6 C) (12/19 0300) Temp src: Oral (12/19 0300) BP: 170/79 mmHg (12/19 0300) Pulse Rate: 61 (12/19 0300)  Labs: No results found for this basename: HGB, HCT, PLT, APTT, LABPROT, INR, HEPARINUNFRC, CREATININE, CKTOTAL, CKMB, TROPONINI,  in the last 72 hours  Estimated Creatinine Clearance: 40.4 ml/min (by C-G formula based on Cr of 1.2).   Medical History: Past Medical History  Diagnosis Date  . Hypertension   . Coronary artery disease   . Arthritis   . Cataract   . Anxiety   . Blood transfusion without reported diagnosis   . Hyperlipidemia   . Heart murmur   . Allergy   . Thyroid disease   . PAF (paroxysmal atrial fibrillation)     coumadin  . History of abdominal aortic aneurysm   . History of echocardiogram 12/2008    EF >55%; mild concentric LVH; mild MR; mild-mod TR; mild AV regurg;   . History of nuclear stress test 12/2010    lexiscan; low risk; compared to prior study, perfusion improved  . History of Doppler ultrasound 05/22/2012    LEAs; R anterior tibial artery appeared occluded; L posterior tibial shows short segment of occlusive ds  . History of Doppler ultrasound 05/22/2012    Abdominal Aortic Doppler; slight increase in fusiform aneurysm     Medications:  Scheduled:  . allopurinol  50 mg Oral q morning - 10a  . apixaban  2.5 mg Oral BID  . ezetimibe  10 mg Oral Daily  . hydrochlorothiazide  25 mg Oral Daily  .  irbesartan  300 mg Oral QHS  . levothyroxine  75 mcg Oral QAC breakfast  . metoprolol  100 mg Oral BID  . potassium chloride  10 mEq Oral BID    Assessment: 79 yo M presented with RLE weakness during family dinner on 12/16.  Code stroke initiated and imaging revealed small right thalamic CVA. Patient was not a candidate for TPA.  Patient was on Coumadin PTA for afib.  Pharmacy was consulted for initiation and transition to apixaban. Apixaban was started when INR was 1.98 and CrCl ~40 on 12/16.  No new labs have been drawn since. MD requested that patient be on lower dose d/t age despite qualifying for 5mg  BID dosing (>60kg, SCr <1.5, but age is >80).   Goal of Therapy:   INR 2-3 Monitor platelets by anticoagulation protocol: Yes   Plan:  - continue apixaban 2.5 mg BID - if patient not discharged, will schedule BMT in AM to f/u renal fx - monitor for s/s of bleeding  Harrold Donath E. Achilles Dunk, PharmD Clinical Pharmacist - Resident Pager: [phone] Pharmacy: [phone] 02/22/2013 10:10 AM    Addendum ============================================ Confirmed per Anthony Sar, PharmD - Discussion took place with physician regarding want for lower dose apixaban despite qualifying for higher dose.  Shelba Flake Achilles Dunk, PharmD Clinical Pharmacist - Resident Pager: [phone] Pharmacy: [phone] 02/22/2013 1:41 PM

## 2013-02-22 NOTE — Progress Notes (Signed)
[facility] PROGRESS NOTE  Jon [name] YQM:[id number] DOB: May 18, 1921 DOA: 02/18/2013 PCP: Kimber Relic, MD  Assessment/Plan: #1 probable acute CVA likely embolic secondary to A. fib versus TIA Patient with some clinical improvement. Patient ambulating in the hallway with physical therapy. Patient was seen by neurology. Patient was unable to get the MRI MRA of the head secondary to pacemaker. CT angiogram of the head and neck was done and it was felt by neurology that patient likely had a small CVA which was too small to be seen on CT. It was felt likely embolic in nature secondary to atrial fibrillation. Patient was on Coumadin prior to admission which has subsequently been changed and patient is now on apixaban for secondary stroke prevention. 2-D echo was done which was negative for PFO with an EF of 40-45%. Patient is to be continued on zetia daily. Outpatient followup with neurology.  #2 atrial fibrillation Rate control on Lopressor. Apixaban for anticoagulation.  #3 hypertension Continue Avapro and Lopressor. Will resume home regimen of hydrochlorothiazide.  #4 history of chronic systolic CHF 2-D echo with EF of 40-45%. Patient is compensated. Continue beta blocker, ARB, diuretic. Followup as outpatient.  #5 hyponatremia Noted to be chronic and stable. Patient asymptomatic. Follow.  #6 coronary artery disease Stable. Continue beta blocker, ARB, diuretic.  #7 well-controlled diabetes mellitus type 2 A1c was 6.0. CBG of 110. Sliding scale insulin.  #8 prophylaxis Patient on apixaban for anticoagulation.  Code Status: Full. Family Communication: Updated patient and daughter at bedside. Disposition Plan: Back to skilled nursing facility.   Consultants: Neurology: Dr Amada Jupiter 02/18/13   Procedures:  CT head 02/18/2013  CT angiogram of the head and neck 02/19/2013  2-D echo 02/20/2013  Carotid Dopplers 02/19/2013  Chest x-ray  02/19/2013  Antibiotics:  None  HPI/Subjective: Patient with dysarthria which per daughter is slowly improving. Patient states strength is also improving.   Objective: Filed Vitals:   02/22/13 0300  BP: 170/79  Pulse: 61  Temp: 97.8 F (36.6 C)  Resp: 16   No intake or output data in the 24 hours ending 02/22/13 1043 Filed Weights   02/18/13 2008 02/19/13 0204  Weight: 82.464 kg (181 lb 12.8 oz) 72.712 kg (160 lb 4.8 oz)    Exam:   General:  NAD  Cardiovascular: Regular rate rhythm no murmurs rubs or gallops   Respiratory: Clear to auscultation bilaterally   Abdomen: Soft, nontender, nondistended, positive bowel sounds   Musculoskeletal: No clubbing cyanosis or edema.  Data Reviewed: Basic Metabolic Panel:  Recent Labs Lab 02/18/13 2025 02/18/13 2032  NA 128* 129*  K 3.5 3.4*  CL 91* 92*  CO2 26  --   GLUCOSE 121* 127*  BUN 19 19  CREATININE 1.12 1.20  CALCIUM 9.0  --    Liver Function Tests:  Recent Labs Lab 02/18/13 2025  AST 23  ALT 16  ALKPHOS 90  BILITOT 0.7  PROT 6.7  ALBUMIN 3.6   No results found for this basename: LIPASE, AMYLASE,  in the last 168 hours No results found for this basename: AMMONIA,  in the last 168 hours CBC:  Recent Labs Lab 02/18/13 2025 02/18/13 2032  WBC 6.9  --   NEUTROABS 3.7  --   HGB 15.5 16.3  HCT 43.1 48.0  MCV 92.3  --   PLT 217  --    Cardiac Enzymes:  Recent Labs Lab 02/18/13 2025  TROPONINI <0.30   BNP (last 3 results) No results found for  this basename: PROBNP,  in the last 8760 hours CBG:  Recent Labs Lab 02/18/13 2043  GLUCAP 110*    No results found for this or any previous visit (from the past 240 hour(s)).   Studies: No results found.  Scheduled Meds: . allopurinol  50 mg Oral q morning - 10a  . apixaban  2.5 mg Oral BID  . ezetimibe  10 mg Oral Daily  . hydrochlorothiazide  25 mg Oral Daily  . irbesartan  300 mg Oral QHS  . levothyroxine  75 mcg Oral QAC breakfast   . metoprolol  100 mg Oral BID  . potassium chloride  10 mEq Oral BID   Continuous Infusions:   Principal Problem:   CVA (cerebral infarction) Active Problems:   Hyponatremia   Atrial fibrillation   HTN (hypertension)   Congestive heart failure, unspecified   Type II or unspecified type diabetes mellitus without mention of complication, not stated as uncontrolled   Coronary atherosclerosis of unspecified type of vessel, native or graft    Time spent: 35 mins    [facility] MD [facility] Pager [phone]. If 7PM-7AM, please contact night-coverage at [email or web address], password [facility] 02/22/2013, 10:43 AM  LOS: 4 days

## 2013-02-25 ENCOUNTER — Non-Acute Institutional Stay (SKILLED_NURSING_FACILITY): Payer: Medicare Other | Admitting: Nurse Practitioner

## 2013-02-25 ENCOUNTER — Encounter: Payer: Self-pay | Admitting: Nurse Practitioner

## 2013-02-25 DIAGNOSIS — I2510 Atherosclerotic heart disease of native coronary artery without angina pectoris: Secondary | ICD-10-CM

## 2013-02-25 DIAGNOSIS — I4891 Unspecified atrial fibrillation: Secondary | ICD-10-CM | POA: Diagnosis not present

## 2013-02-25 DIAGNOSIS — E507 Other ocular manifestations of vitamin A deficiency: Secondary | ICD-10-CM

## 2013-02-25 DIAGNOSIS — I10 Essential (primary) hypertension: Secondary | ICD-10-CM | POA: Diagnosis not present

## 2013-02-25 DIAGNOSIS — H11149 Conjunctival xerosis, unspecified, unspecified eye: Secondary | ICD-10-CM

## 2013-02-25 DIAGNOSIS — E039 Hypothyroidism, unspecified: Secondary | ICD-10-CM

## 2013-02-25 DIAGNOSIS — I509 Heart failure, unspecified: Secondary | ICD-10-CM | POA: Diagnosis not present

## 2013-02-25 DIAGNOSIS — I6350 Cerebral infarction due to unspecified occlusion or stenosis of unspecified cerebral artery: Secondary | ICD-10-CM | POA: Diagnosis not present

## 2013-02-25 DIAGNOSIS — E871 Hypo-osmolality and hyponatremia: Secondary | ICD-10-CM

## 2013-02-25 DIAGNOSIS — M109 Gout, unspecified: Secondary | ICD-10-CM

## 2013-02-25 DIAGNOSIS — F329 Major depressive disorder, single episode, unspecified: Secondary | ICD-10-CM

## 2013-02-25 DIAGNOSIS — I639 Cerebral infarction, unspecified: Secondary | ICD-10-CM

## 2013-02-25 HISTORY — DX: Other ocular manifestations of vitamin A deficiency: E50.7

## 2013-02-25 NOTE — Assessment & Plan Note (Addendum)
stable, remains eu volemic, takes HCTZ 25mg daily.     

## 2013-02-25 NOTE — Assessment & Plan Note (Signed)
Artificial tears I gtt OU qid while awake.

## 2013-02-25 NOTE — Assessment & Plan Note (Addendum)
Difficulty falling asleep--takes Ativan almost routinely-occasional takes Ambien. Has more antsy and episodes of dizziness associated with hyperventilation. Will have Lorazepam 0.5mg  hs prn and q6hr prn available to him.

## 2013-02-25 NOTE — Assessment & Plan Note (Signed)
No flare ups. Takes Allopurinol 50mg daily.   

## 2013-02-25 NOTE — Assessment & Plan Note (Signed)
continue apixiban, currently rate controlled    

## 2013-02-25 NOTE — Assessment & Plan Note (Signed)
currently no chest pain continue home medications    

## 2013-02-25 NOTE — Assessment & Plan Note (Addendum)
Takes Levothyroxine , TSH 3.597 01/08/13

## 2013-02-25 NOTE — Assessment & Plan Note (Signed)
Controlled on Avapro 300mg, Metoprolol 100mg bid     

## 2013-02-25 NOTE — Assessment & Plan Note (Addendum)
chronic and stable, update BMP

## 2013-02-25 NOTE — Assessment & Plan Note (Addendum)
Diffuse atherosclerotic irregularity and plaque formation of the distal right common carotid artery and proximal internal carotid artery without significant stenosis. Plaque ulceration is present and is a source of emboli-02/19/13 angiography brain 02/19/13. Mild facial paralysis and unsteady gait presents, otherwise he is at his baseline mentation and physical condition. Here for Rehab--[place] or AL is goal of Rehab.

## 2013-02-25 NOTE — Progress Notes (Signed)
Patient ID: Jon [name], male   DOB: 01/31/1922, 79 y.o.   MRN: [id number]   Code Status: DNR  Allergies  Allergen Reactions  . Caduet [Amlodipine-Atorvastatin]     Weakness   . Crestor [Rosuvastatin]   . Cymbalta [Duloxetine Hcl] Other (See Comments)    Excessive sedation  . Lipitor [Atorvastatin Calcium]     Muscle pain  . Lisinopril   . Simvastatin     Muscle pain    Chief Complaint  Patient presents with  . Acute Visit    acute CVA  . Hospitalization Follow-up    HPI: Patient is a 79 y.o. male seen in the SNF at [facility] today for evaluation of s/p hospitalization for acute CVA and other chronic medical conditions. Hospital stay 02/18/2013 -02/21/13 has a past medical history of Hypertension; Coronary artery disease; Arthritis; Cataract; Anxiety; Blood transfusion without reported diagnosis; Hyperlipidemia; Heart murmur; Allergy; Thyroid disease; PAF (paroxysmal atrial fibrillation); History of abdominal aortic aneurysm; History of echocardiogram (12/2008); History of nuclear stress test (12/2010); History of Doppler ultrasound (05/22/2012); and History of Doppler ultrasound (05/22/2012). Presented to ED with feeling weak with some trouble moving right leg, slurred speech. He was brought to ER and was found to have small right thalamic CVA on CT scan Patient is on coumadin for hx of A.fib. He was not a candidate for TPA  Problem List Items Addressed This Visit   Atrial fibrillation - Primary (Chronic)     continue apixiban, currently rate controlled     HTN (hypertension) (Chronic)     Controlled on Avapro 300mg , Metoprolol 100mg  bid      Hypothyroidism (Chronic)     Takes Levothyroxine , TSH 3.597 01/08/13      Hyponatremia     chronic and stable, update BMP    Major depressive disorder, single episode, unspecified     Difficulty falling asleep--takes Ativan almost routinely-occasional takes Ambien. Has more antsy and episodes of dizziness  associated with hyperventilation. Will have Lorazepam 0.5mg  hs prn and q6hr prn available to him.       Congestive heart failure, unspecified     stable, remains eu volemic, takes HCTZ 25mg  daily.      Gout, unspecified     No flare ups. Takes Allopurinol 50mg  daily.       Coronary atherosclerosis of unspecified type of vessel, native or graft     currently no chest pain continue home medications     CVA (cerebral infarction)     Diffuse atherosclerotic irregularity and plaque formation of the distal right common carotid artery and proximal internal carotid artery without significant stenosis. Plaque ulceration is present and is a source of emboli-02/19/13 angiography brain 02/19/13. Mild facial paralysis and unsteady gait presents, otherwise he is at his baseline mentation and physical condition. Here for Rehab--[place] or [place] is goal of Rehab.     Xerophthalmia     Artificial tears I gtt OU qid while awake.        Review of Systems:  Review of Systems  Constitutional: Negative for fever, chills, weight loss, malaise/fatigue and diaphoresis.  HENT: Positive for hearing loss. Negative for congestion, ear discharge, ear pain, nosebleeds, sore throat and tinnitus.   Eyes: Negative for blurred vision, double vision, photophobia, pain, discharge and redness.       Dry eyes  Respiratory: Positive for cough. Negative for hemoptysis, sputum production, shortness of breath, wheezing and stridor.   Cardiovascular: Positive for leg swelling and PND. Negative for  chest pain, palpitations, orthopnea and claudication.  Gastrointestinal: Negative for heartburn, nausea, abdominal pain, diarrhea, constipation and blood in stool.  Genitourinary: Positive for urgency and frequency. Negative for dysuria, hematuria and flank pain.  Musculoskeletal: Negative for back pain, falls, joint pain, myalgias and neck pain.  Skin: Positive for itching and rash.       Red spots on face.   Neurological: Positive for  speech change and focal weakness. Negative for dizziness, tingling, tremors, sensory change, seizures, loss of consciousness, weakness and headaches.       Mild right facial droop and right sided weakness. Grip strength 5/5-but weaker than the left. Unsteady gait.   Endo/Heme/Allergies: Positive for environmental allergies. Negative for polydipsia. Does not bruise/bleed easily.  Psychiatric/Behavioral: Positive for depression and memory loss. Negative for suicidal ideas, hallucinations and substance abuse. The patient is nervous/anxious and has insomnia.      Past Medical History  Diagnosis Date  . Hypertension   . Coronary artery disease   . Arthritis   . Cataract   . Anxiety   . Blood transfusion without reported diagnosis   . Hyperlipidemia   . Heart murmur   . Allergy   . Thyroid disease   . PAF (paroxysmal atrial fibrillation)     coumadin  . History of abdominal aortic aneurysm   . History of echocardiogram 12/2008    EF >55%; mild concentric LVH; mild MR; mild-mod TR; mild AV regurg;   . History of nuclear stress test 12/2010    lexiscan; low risk; compared to prior study, perfusion improved  . History of Doppler ultrasound 05/22/2012    LEAs; R anterior tibial artery appeared occluded; L posterior tibial shows short segment of occlusive ds  . History of Doppler ultrasound 05/22/2012    Abdominal Aortic Doppler; slight increase in fusiform aneurysm    Past Surgical History  Procedure Laterality Date  . Coronary artery bypass graft  1989  . Pacemaker insertion  2011  . Insert / replace / remove pacemaker    . Eye surgery    . Joint replacement    . Hernia repair    . Coronary angioplasty with stent placement  2008    stent to SVG to OM   Social History:   reports that he quit smoking about 46 years ago. His smoking use included Cigarettes. He has a 11.25 pack-year smoking history. He has never used smokeless tobacco. He reports that he does not drink alcohol or use  illicit drugs.  Family History  Problem Relation Age of Onset  . Diabetes type II Father   . Other Mother     PAD with amputations    Medications: Patient's Medications  New Prescriptions   No medications on file  Previous Medications   ALLOPURINOL (ZYLOPRIM) 100 MG TABLET    Take 50 mg by mouth every morning.   AMOXICILLIN (AMOXIL) 500 MG TABLET    Take 500 mg by mouth. Take four tablets one hour prior to dental procedures.   APIXABAN (ELIQUIS) 2.5 MG TABS TABLET    Take 1 tablet (2.5 mg total) by mouth 2 (two) times daily.   ASPIRIN EC 81 MG TABLET    Take 81 mg by mouth daily.     CETAPHIL (CETAPHIL) LOTION    Apply 1 application topically daily as needed. Compounded with triamcinolone cream 1% (1:3 ratio).  For rash    CHOLECALCIFEROL (VITAMIN D) 400 UNITS CAPSULE    Take 400 Units by mouth 2 (two) times  daily.    DIPHENHYDRAMINE (BENADRYL) 25 MG TABLET    Take 25 mg by mouth every 6 (six) hours as needed for itching.   EZETIMIBE (ZETIA) 10 MG TABLET    Take 1 tablet (10 mg total) by mouth daily.   HYDROCHLOROTHIAZIDE (HYDRODIURIL) 25 MG TABLET    Take 25 mg by mouth daily.   IRBESARTAN (AVAPRO) 300 MG TABLET    Take 300 mg by mouth at bedtime. Take one tablet daily for blood pressure   LEVOTHYROXINE (SYNTHROID, LEVOTHROID) 75 MCG TABLET    Take 75 mcg by mouth daily before breakfast.   LORAZEPAM (ATIVAN) 0.5 MG TABLET    Take 1 tablet (0.5 mg total) by mouth at bedtime as needed for anxiety or sleep.   METOPROLOL (LOPRESSOR) 100 MG TABLET    Take 100 mg by mouth 2 (two) times daily.    NITROGLYCERIN (NITROSTAT) 0.4 MG SL TABLET    Place 0.4 mg under the tongue every 5 (five) minutes as needed. For chest pain    POTASSIUM CHLORIDE (MICRO-K) 10 MEQ CR CAPSULE    Take 10 mEq by mouth 2 (two) times daily.   ZOLPIDEM (AMBIEN) 10 MG TABLET    Take 2.5-5 mg by mouth at bedtime as needed for sleep.  Modified Medications   No medications on file  Discontinued Medications   No medications  on file     Physical Exam: Physical Exam  Constitutional: He is oriented to person, place, and time. He appears well-developed and well-nourished. No distress.  HENT:  Head: Normocephalic and atraumatic.  Right Ear: External ear normal.  Left Ear: External ear normal.  Nose: Nose normal.  Mouth/Throat: Oropharynx is clear and moist. No oropharyngeal exudate.  Eyes: Conjunctivae and EOM are normal. Pupils are equal, round, and reactive to light. Right eye exhibits no discharge. Left eye exhibits no discharge. No scleral icterus.  Neck: Normal range of motion. Neck supple. No JVD present. No tracheal deviation present. No thyromegaly present.  Cardiovascular: Normal rate, regular rhythm and normal heart sounds.   No murmur heard. Pulmonary/Chest: Effort normal and breath sounds normal. No stridor. No respiratory distress. He has no wheezes. He has no rales.  Abdominal: Soft. Bowel sounds are normal. He exhibits no distension. There is no tenderness. There is no rebound and no guarding.  Musculoskeletal: Normal range of motion. He exhibits no edema and no tenderness.  Lymphadenopathy:    He has no cervical adenopathy.  Neurological: He is alert and oriented to person, place, and time. He displays abnormal reflex. A cranial nerve deficit is present. He exhibits abnormal muscle tone. Coordination abnormal.  Mild right facial droop, unsteady gait, muscle strength 5/5 of the right limbs but still weaker the the left.   Skin: Skin is warm and dry. Rash noted. He is not diaphoretic. No erythema.  Psychiatric: His mood appears anxious. His affect is not angry, not blunt, not labile and not inappropriate. His speech is not delayed, not tangential and not slurred. He is not agitated, not aggressive, not hyperactive, not slowed, not withdrawn, not actively hallucinating and not combative. Thought content is not paranoid and not delusional. Cognition and memory are impaired. He does not express  impulsivity or inappropriate judgment. He exhibits a depressed mood. He expresses no homicidal and no suicidal ideation. He expresses no suicidal plans and no homicidal plans. He is communicative. He exhibits abnormal recent memory. He is attentive.    Filed Vitals:   02/25/13 1214  BP: 106/64  Pulse: 60  Temp: 97.5 F (36.4 C)  TempSrc: Tympanic  Resp: 20      Labs reviewed: Basic Metabolic Panel:  Recent Labs  16/10/96 02/18/13 2025 02/18/13 2032 02/22/13 0943  NA 133* 128* 129* 130*  K 4.3 3.5 3.4* 4.0  CL  --  91* 92* 97  CO2  --  26  --  23  GLUCOSE  --  121* 127* 131*  BUN 15 19 19 14   CREATININE 1.0 1.12 1.20 0.74  CALCIUM  --  9.0  --  8.7  TSH 3.60  --   --   --    Liver Function Tests:  Recent Labs  01/08/13 02/18/13 2025  AST 26 23  ALT 21 16  ALKPHOS 89 90  BILITOT  --  0.7  PROT  --  6.7  ALBUMIN  --  3.6   CBC:  Recent Labs  11/08/12 1611 02/18/13 2025 02/18/13 2032  WBC 7.2 6.9  --   NEUTROABS 4.7 3.7  --   HGB 16.0 15.5 16.3  HCT 47.4 43.1 48.0  MCV 95.4 92.3  --   PLT 260.0 217  --    Lipid Panel:  Recent Labs  01/08/13 02/19/13 0355  CHOL 212* 187  HDL 43 34*  LDLCALC 141 117*  TRIG 142 181*  CHOLHDL  --  5.5    Past Procedures:  Ct Angio Head W/cm &/or Wo Cm  02/19/2013 CLINICAL DATA: Slurred speech. Stroke. EXAM: CT ANGIOGRAPHY HEAD AND NEC IMPRESSION: Diffuse atherosclerotic disease Moderate stenosis of the right cavernous carotid with heavily calcified plaque. There is fusiform dilatation of the right supra clinoid internal carotid artery compatible with atherosclerotic disease. Mild to moderate atherosclerotic stenosis of the left cavernous carotid. Calcified plaque with mild to moderate stenosis distal vertebral artery bilaterally Diffuse atherosclerotic irregularity and plaque formation of the distal right common carotid artery and proximal internal carotid artery without significant stenosis. Plaque ulceration is  present and is a source of emboli. Atherosclerotic disease in the left carotid bifurcation without hemodynamically significant stenosis.   Dg Chest 2 View  02/19/2013 CLINICAL DATA: History CVA EXAM: CHEST 2 VIEW IMPRESSION: Low lung volumes without evidence of acute cardiopulmonary disease.   Ct Head Wo Contrast  02/19/2013 CLINICAL DATA: Worsening facial droop and slurred speech. EXAM: CT HEAD WITHOUT CONTRAST IMPRESSION: No acute intracranial findings. Stable chronic small vessel disease, old right thalamic lacune, and old left cerebellar infarct.   Ct Head (brain) Wo Contrast  02/18/2013 CLINICAL DATA: Last seen normal at 3 p.m., right-sided weakness, right facial droop, slurred speech, history hypertension, coronary artery disease EXAM: CT HEAD WITHOUT CONTRAST:IMPRESSION: Atrophy with small vessel chronic ischemic changes of deep cerebral white matter. Small lacunar infarct at right thalamus, question slightly larger than on previous exam. No evidence of cortical infarction or intracranial hemorrhage.   Ct Angio Neck W/cm &/or Wo/cm 02/19/2013 CLINICAL DATA: Slurred speech. Stroke. EXAM: CT ANGIOGRAPHY HEAD AND NECK IMPRESSION: Diffuse atherosclerotic disease Moderate stenosis of the right cavernous carotid with heavily calcified plaque. There is fusiform dilatation of the right supra clinoid internal carotid artery compatible with atherosclerotic disease. Mild to moderate atherosclerotic stenosis of the left cavernous carotid. Calcified plaque with mild to moderate stenosis distal vertebral artery bilaterally Diffuse atherosclerotic irregularity and plaque formation of the distal right common carotid artery and proximal internal carotid artery without significant stenosis. Plaque ulceration is present and is a source of emboli. Atherosclerotic disease in the left carotid bifurcation without hemodynamically significant  stenosis.   Assessment/Plan CVA (cerebral infarction) Diffuse  atherosclerotic irregularity and plaque formation of the distal right common carotid artery and proximal internal carotid artery without significant stenosis. Plaque ulceration is present and is a source of emboli-02/19/13 angiography brain 02/19/13. Mild facial paralysis and unsteady gait presents, otherwise he is at his baseline mentation and physical condition. Here for Rehab--[place] or [place] is goal of Rehab.   Atrial fibrillation continue apixiban, currently rate controlled   Congestive heart failure, unspecified stable, remains eu volemic, takes HCTZ 25mg  daily.    HTN (hypertension) Controlled on Avapro 300mg , Metoprolol 100mg  bid    Hyponatremia chronic and stable, update BMP  Coronary atherosclerosis of unspecified type of vessel, native or graft currently no chest pain continue home medications   Hypothyroidism Takes Levothyroxine , TSH 3.597 01/08/13    Gout, unspecified No flare ups. Takes Allopurinol 50mg  daily.     Major depressive disorder, single episode, unspecified Difficulty falling asleep--takes Ativan almost routinely-occasional takes Ambien. Has more antsy and episodes of dizziness associated with hyperventilation. Will have Lorazepam 0.5mg  hs prn and q6hr prn available to him.     Xerophthalmia Artificial tears I gtt OU qid while awake.     Family/ Staff Communication: observe the patient  Goals of Care: [place] or [place]  Labs/tests ordered: BMP

## 2013-03-11 ENCOUNTER — Encounter: Payer: Self-pay | Admitting: Nurse Practitioner

## 2013-03-11 ENCOUNTER — Non-Acute Institutional Stay (SKILLED_NURSING_FACILITY): Payer: Medicare Other | Admitting: Nurse Practitioner

## 2013-03-11 DIAGNOSIS — K5900 Constipation, unspecified: Secondary | ICD-10-CM

## 2013-03-11 DIAGNOSIS — N138 Other obstructive and reflux uropathy: Secondary | ICD-10-CM

## 2013-03-11 DIAGNOSIS — I6350 Cerebral infarction due to unspecified occlusion or stenosis of unspecified cerebral artery: Secondary | ICD-10-CM

## 2013-03-11 DIAGNOSIS — I10 Essential (primary) hypertension: Secondary | ICD-10-CM | POA: Diagnosis not present

## 2013-03-11 DIAGNOSIS — R350 Frequency of micturition: Secondary | ICD-10-CM

## 2013-03-11 DIAGNOSIS — F329 Major depressive disorder, single episode, unspecified: Secondary | ICD-10-CM

## 2013-03-11 DIAGNOSIS — L299 Pruritus, unspecified: Secondary | ICD-10-CM

## 2013-03-11 DIAGNOSIS — J309 Allergic rhinitis, unspecified: Secondary | ICD-10-CM | POA: Diagnosis not present

## 2013-03-11 DIAGNOSIS — I4891 Unspecified atrial fibrillation: Secondary | ICD-10-CM | POA: Diagnosis not present

## 2013-03-11 DIAGNOSIS — N401 Enlarged prostate with lower urinary tract symptoms: Secondary | ICD-10-CM

## 2013-03-11 DIAGNOSIS — E119 Type 2 diabetes mellitus without complications: Secondary | ICD-10-CM

## 2013-03-11 DIAGNOSIS — E871 Hypo-osmolality and hyponatremia: Secondary | ICD-10-CM

## 2013-03-11 DIAGNOSIS — I2510 Atherosclerotic heart disease of native coronary artery without angina pectoris: Secondary | ICD-10-CM

## 2013-03-11 DIAGNOSIS — E039 Hypothyroidism, unspecified: Secondary | ICD-10-CM

## 2013-03-11 DIAGNOSIS — I639 Cerebral infarction, unspecified: Secondary | ICD-10-CM

## 2013-03-11 DIAGNOSIS — I509 Heart failure, unspecified: Secondary | ICD-10-CM

## 2013-03-11 DIAGNOSIS — M109 Gout, unspecified: Secondary | ICD-10-CM

## 2013-03-11 NOTE — Assessment & Plan Note (Signed)
Stable

## 2013-03-11 NOTE — Assessment & Plan Note (Signed)
Vs vasomotor rhinitis since clear nasal drainage worsens at meals: will try Atovent nasal spray I spray each nostril ac meals.

## 2013-03-11 NOTE — Progress Notes (Signed)
Patient ID: Jon [name], male   DOB: 09-Nov-1921, 80 y.o.   MRN: [id number]   Code Status: DNR  Allergies  Allergen Reactions  . Caduet [Amlodipine-Atorvastatin]     Weakness   . Crestor [Rosuvastatin]   . Cymbalta [Duloxetine Hcl] Other (See Comments)    Excessive sedation  . Lipitor [Atorvastatin Calcium]     Muscle pain  . Lisinopril   . Simvastatin     Muscle pain    Chief Complaint  Patient presents with  . Medical Managment of Chronic Issues    itching skin, incontinent urine x1 03/07/13, running nose  . Acute Visit    HPI: Patient is a 80 y.o. male seen in the SNF at [facility] today for evaluation of s/p itching skin, clear running nose-worsens at meals, urinary incontinent x1 03/07/13 night,  and other chronic medical conditions. Hospital stay 02/18/2013 -02/21/13 has a past medical history of Hypertension; Coronary artery disease; Arthritis; Cataract; Anxiety; Blood transfusion without reported diagnosis; Hyperlipidemia; Heart murmur; Allergy; Thyroid disease; PAF (paroxysmal atrial fibrillation); History of abdominal aortic aneurysm; History of echocardiogram (12/2008); History of nuclear stress test (12/2010), and History of Doppler ultrasound (05/22/2012). Presented to ED with feeling weak with some trouble moving right leg, slurred speech. He was brought to ER and was found to have small right thalamic CVA on CT scan Patient is on coumadin for hx of A.fib. He was not a candidate for TPA  Problem List Items Addressed This Visit   Atrial fibrillation - Primary (Chronic)     continue apixiban, currently rate controlled       HTN (hypertension) (Chronic)     Controlled on Avapro 300mg , Metoprolol 100mg  bid        Hypothyroidism (Chronic)     Takes Levothyroxine 112mcg, TSH 3.597 01/08/13      Type II or unspecified type diabetes mellitus without mention of complication, not stated as uncontrolled (Chronic)     Diet controlled, Hgb A1c6.0 01/08/13      Hyponatremia     chronic and stable Na 130s      Unspecified constipation     Stable.     Major depressive disorder, single episode, unspecified     Difficulty falling asleep--takes Ativan almost routinely-occasional takes Ambien. Has more antsy and episodes of dizziness associated with hyperventilation. Lorazepam 0.5mg  hs prn and q6hr prn available to him.         Congestive heart failure, unspecified     stable, remains eu volemic, takes HCTZ 25mg  daily.        Urinary frequency     2-3x/night bathroom trips with urgency and leakage--participated Kegel exercise. Urinary incontinence x1 03/07/13 night w/o dysuria. No further occurrence. Afebrile, denied suprapubic or CVA or lower back tenderness or discomfort. May consider Urology consultation if recurs. Last Urology visit was about 4-5 years ago @ [facility].       Gout, unspecified     No flare ups. Takes Allopurinol 50mg  daily.         Coronary atherosclerosis of unspecified type of vessel, native or graft     currently no chest pain continue home medications       Hypertrophy of prostate with urinary obstruction and other lower urinary tract symptoms (LUTS)     Nocturnal urinary frequency with urgency.     Pruritus     Itching skin w/o apparent rash. Will have Hydrocortisone cream to affected area nightly for symptomatic relief.     Allergic  rhinitis     Vs vasomotor rhinitis since clear nasal drainage worsens at meals: will try Atovent nasal spray I spray each nostril ac meals.     Relevant Medications      ipratropium (ATROVENT) 0.06 % nasal spray      ipratropium (ATROVENT) 0.03 % nasal spray   CVA (cerebral infarction)     Diffuse atherosclerotic irregularity and plaque formation of the distal right common carotid artery and proximal internal carotid artery without significant stenosis. Plaque ulceration is present and is a source of emboli-02/19/13 angiography brain 02/19/13. Mild right acial  paralysis and unsteady gait presents, otherwise he is at his baseline mentation and physical condition. Here for Rehab--[place] or [place] is goal of Rehab.          Review of Systems:  Review of Systems  Constitutional: Negative for fever, chills, weight loss, malaise/fatigue and diaphoresis.  HENT: Positive for hearing loss. Negative for congestion, ear discharge, ear pain, nosebleeds, sore throat and tinnitus.        Clear running nose-worsens with meals.   Eyes: Negative for blurred vision, double vision, photophobia, pain, discharge and redness.       Dry eyes  Respiratory: Positive for cough. Negative for hemoptysis, sputum production, shortness of breath, wheezing and stridor.   Cardiovascular: Positive for leg swelling and PND. Negative for chest pain, palpitations, orthopnea and claudication.  Gastrointestinal: Negative for heartburn, nausea, abdominal pain, diarrhea, constipation and blood in stool.  Genitourinary: Positive for urgency and frequency. Negative for dysuria, hematuria and flank pain.  Musculoskeletal: Negative for back pain, falls, joint pain, myalgias and neck pain.  Skin: Positive for itching and rash.       Red spots on face.   Neurological: Positive for speech change and focal weakness. Negative for dizziness, tingling, tremors, sensory change, seizures, loss of consciousness, weakness and headaches.       Mild right facial droop and right sided weakness. Grip strength 5/5-but weaker than the left. Unsteady gait.   Endo/Heme/Allergies: Positive for environmental allergies. Negative for polydipsia. Does not bruise/bleed easily.  Psychiatric/Behavioral: Positive for depression and memory loss. Negative for suicidal ideas, hallucinations and substance abuse. The patient is nervous/anxious and has insomnia.      Past Medical History  Diagnosis Date  . Hypertension   . Coronary artery disease   . Arthritis   . Cataract   . Anxiety   . Blood transfusion without reported  diagnosis   . Hyperlipidemia   . Heart murmur   . Allergy   . Thyroid disease   . PAF (paroxysmal atrial fibrillation)     coumadin  . History of abdominal aortic aneurysm   . History of echocardiogram 12/2008    EF >55%; mild concentric LVH; mild MR; mild-mod TR; mild AV regurg;   . History of nuclear stress test 12/2010    lexiscan; low risk; compared to prior study, perfusion improved  . History of Doppler ultrasound 05/22/2012    LEAs; R anterior tibial artery appeared occluded; L posterior tibial shows short segment of occlusive ds  . History of Doppler ultrasound 05/22/2012    Abdominal Aortic Doppler; slight increase in fusiform aneurysm    Past Surgical History  Procedure Laterality Date  . Coronary artery bypass graft  1989  . Pacemaker insertion  2011  . Insert / replace / remove pacemaker    . Eye surgery    . Joint replacement    . Hernia repair    . Coronary angioplasty  with stent placement  2008    stent to SVG to OM   Social History:   reports that he quit smoking about 46 years ago. His smoking use included Cigarettes. He has a 11.25 pack-year smoking history. He has never used smokeless tobacco. He reports that he does not drink alcohol or use illicit drugs.  Family History  Problem Relation Age of Onset  . Diabetes type II Father   . Other Mother     PAD with amputations    Medications: Patient's Medications  New Prescriptions   No medications on file  Previous Medications   ALLOPURINOL (ZYLOPRIM) 100 MG TABLET    Take 50 mg by mouth every morning.   AMOXICILLIN (AMOXIL) 500 MG TABLET    Take 500 mg by mouth. Take four tablets one hour prior to dental procedures.   APIXABAN (ELIQUIS) 2.5 MG TABS TABLET    Take 1 tablet (2.5 mg total) by mouth 2 (two) times daily.   ASPIRIN EC 81 MG TABLET    Take 81 mg by mouth daily.     CETAPHIL (CETAPHIL) LOTION    Apply 1 application topically daily as needed. Compounded with triamcinolone cream 1% (1:3 ratio).  For  rash    CHOLECALCIFEROL (VITAMIN D) 400 UNITS CAPSULE    Take 400 Units by mouth 2 (two) times daily.    DIPHENHYDRAMINE (BENADRYL) 25 MG TABLET    Take 25 mg by mouth every 6 (six) hours as needed for itching.   EZETIMIBE (ZETIA) 10 MG TABLET    Take 1 tablet (10 mg total) by mouth daily.   HYDROCHLOROTHIAZIDE (HYDRODIURIL) 25 MG TABLET    Take 25 mg by mouth daily.   IPRATROPIUM (ATROVENT) 0.03 % NASAL SPRAY    Place 2 sprays into both nostrils 3 (three) times daily.   IPRATROPIUM (ATROVENT) 0.06 % NASAL SPRAY    Place 2 sprays into both nostrils 4 (four) times daily.   IRBESARTAN (AVAPRO) 300 MG TABLET    Take 300 mg by mouth at bedtime. Take one tablet daily for blood pressure   LEVOTHYROXINE (SYNTHROID, LEVOTHROID) 75 MCG TABLET    Take 75 mcg by mouth daily before breakfast.   LORAZEPAM (ATIVAN) 0.5 MG TABLET    Take 1 tablet (0.5 mg total) by mouth at bedtime as needed for anxiety or sleep.   METOPROLOL (LOPRESSOR) 100 MG TABLET    Take 100 mg by mouth 2 (two) times daily.    NITROGLYCERIN (NITROSTAT) 0.4 MG SL TABLET    Place 0.4 mg under the tongue every 5 (five) minutes as needed. For chest pain    POTASSIUM CHLORIDE (MICRO-K) 10 MEQ CR CAPSULE    Take 10 mEq by mouth 2 (two) times daily.   ZOLPIDEM (AMBIEN) 10 MG TABLET    Take 2.5-5 mg by mouth at bedtime as needed for sleep.  Modified Medications   No medications on file  Discontinued Medications   No medications on file     Physical Exam: Physical Exam  Constitutional: He is oriented to person, place, and time. He appears well-developed and well-nourished. No distress.  HENT:  Head: Normocephalic and atraumatic.  Right Ear: External ear normal.  Left Ear: External ear normal.  Nose: Nose normal.  Mouth/Throat: Oropharynx is clear and moist. No oropharyngeal exudate.  Eyes: Conjunctivae and EOM are normal. Pupils are equal, round, and reactive to light. Right eye exhibits no discharge. Left eye exhibits no discharge. No  scleral icterus.  Neck: Normal range  of motion. Neck supple. No JVD present. No tracheal deviation present. No thyromegaly present.  Cardiovascular: Normal rate, regular rhythm and normal heart sounds.   No murmur heard. Pulmonary/Chest: Effort normal and breath sounds normal. No stridor. No respiratory distress. He has no wheezes. He has no rales.  Abdominal: Soft. Bowel sounds are normal. He exhibits no distension. There is no tenderness. There is no rebound and no guarding.  Musculoskeletal: Normal range of motion. He exhibits no edema and no tenderness.  Lymphadenopathy:    He has no cervical adenopathy.  Neurological: He is alert and oriented to person, place, and time. He displays abnormal reflex. A cranial nerve deficit is present. He exhibits abnormal muscle tone. Coordination abnormal.  Mild right facial droop, unsteady gait, muscle strength 5/5 of the right limbs but still weaker the the left.   Skin: Skin is warm and dry. Rash noted. He is not diaphoretic. No erythema.  Psychiatric: His mood appears anxious. His affect is not angry, not blunt, not labile and not inappropriate. His speech is not delayed, not tangential and not slurred. He is not agitated, not aggressive, not hyperactive, not slowed, not withdrawn, not actively hallucinating and not combative. Thought content is not paranoid and not delusional. Cognition and memory are impaired. He does not express impulsivity or inappropriate judgment. He exhibits a depressed mood. He expresses no homicidal and no suicidal ideation. He expresses no suicidal plans and no homicidal plans. He is communicative. He exhibits abnormal recent memory. He is attentive.    Filed Vitals:   03/11/13 1611  BP: 146/75  Pulse: 80  Temp: 97.8 F (36.6 C)  TempSrc: Tympanic  Resp: 22      Labs reviewed: Basic Metabolic Panel:  Recent Labs  01/08/13 02/18/13 2025 02/18/13 2032 02/22/13 0943  NA 133* 128* 129* 130*  K 4.3 3.5 3.4* 4.0  CL   --  91* 92* 97  CO2  --  26  --  23  GLUCOSE  --  121* 127* 131*  BUN 15 19 19 14   CREATININE 1.0 1.12 1.20 0.74  CALCIUM  --  9.0  --  8.7  TSH 3.60  --   --   --    Liver Function Tests:  Recent Labs  01/08/13 02/18/13 2025  AST 26 23  ALT 21 16  ALKPHOS 89 90  BILITOT  --  0.7  PROT  --  6.7  ALBUMIN  --  3.6   CBC:  Recent Labs  11/08/12 1611 02/18/13 2025 02/18/13 2032  WBC 7.2 6.9  --   NEUTROABS 4.7 3.7  --   HGB 16.0 15.5 16.3  HCT 47.4 43.1 48.0  MCV 95.4 92.3  --   PLT 260.0 217  --    Lipid Panel:  Recent Labs  01/08/13 02/19/13 0355  CHOL 212* 187  HDL 43 34*  LDLCALC 141 117*  TRIG 142 181*  CHOLHDL  --  5.5    Past Procedures:  Ct Angio Head W/cm &/or Wo Cm  02/19/2013 CLINICAL DATA: Slurred speech. Stroke. EXAM: CT ANGIOGRAPHY HEAD AND NEC IMPRESSION: Diffuse atherosclerotic disease Moderate stenosis of the right cavernous carotid with heavily calcified plaque. There is fusiform dilatation of the right supra clinoid internal carotid artery compatible with atherosclerotic disease. Mild to moderate atherosclerotic stenosis of the left cavernous carotid. Calcified plaque with mild to moderate stenosis distal vertebral artery bilaterally Diffuse atherosclerotic irregularity and plaque formation of the distal right common carotid artery and proximal internal  carotid artery without significant stenosis. Plaque ulceration is present and is a source of emboli. Atherosclerotic disease in the left carotid bifurcation without hemodynamically significant stenosis.   Dg Chest 2 View  02/19/2013 CLINICAL DATA: History CVA EXAM: CHEST 2 VIEW IMPRESSION: Low lung volumes without evidence of acute cardiopulmonary disease.   Ct Head Wo Contrast  02/19/2013 CLINICAL DATA: Worsening facial droop and slurred speech. EXAM: CT HEAD WITHOUT CONTRAST IMPRESSION: No acute intracranial findings. Stable chronic small vessel disease, old right thalamic lacune, and old left  cerebellar infarct.   Ct Head (brain) Wo Contrast  02/18/2013 CLINICAL DATA: Last seen normal at 3 p.m., right-sided weakness, right facial droop, slurred speech, history hypertension, coronary artery disease EXAM: CT HEAD WITHOUT CONTRAST:IMPRESSION: Atrophy with small vessel chronic ischemic changes of deep cerebral white matter. Small lacunar infarct at right thalamus, question slightly larger than on previous exam. No evidence of cortical infarction or intracranial hemorrhage.   Ct Angio Neck W/cm &/or Wo/cm 02/19/2013 CLINICAL DATA: Slurred speech. Stroke. EXAM: CT ANGIOGRAPHY HEAD AND NECK IMPRESSION: Diffuse atherosclerotic disease Moderate stenosis of the right cavernous carotid with heavily calcified plaque. There is fusiform dilatation of the right supra clinoid internal carotid artery compatible with atherosclerotic disease. Mild to moderate atherosclerotic stenosis of the left cavernous carotid. Calcified plaque with mild to moderate stenosis distal vertebral artery bilaterally Diffuse atherosclerotic irregularity and plaque formation of the distal right common carotid artery and proximal internal carotid artery without significant stenosis. Plaque ulceration is present and is a source of emboli. Atherosclerotic disease in the left carotid bifurcation without hemodynamically significant stenosis.   Assessment/Plan Atrial fibrillation continue apixiban, currently rate controlled     HTN (hypertension) Controlled on Avapro 300mg , Metoprolol 100mg  bid      Hypothyroidism Takes Levothyroxine 173mcg, TSH 3.597 01/08/13    Type II or unspecified type diabetes mellitus without mention of complication, not stated as uncontrolled Diet controlled, Hgb A1c6.0 01/08/13    Hyponatremia chronic and stable Na 130s    Unspecified constipation Stable.   Major depressive disorder, single episode, unspecified Difficulty falling asleep--takes Ativan almost routinely-occasional takes  Ambien. Has more antsy and episodes of dizziness associated with hyperventilation. Lorazepam 0.5mg  hs prn and q6hr prn available to him.       Congestive heart failure, unspecified stable, remains eu volemic, takes HCTZ 25mg  daily.      Urinary frequency 2-3x/night bathroom trips with urgency and leakage--participated Kegel exercise. Urinary incontinence x1 03/07/13 night w/o dysuria. No further occurrence. Afebrile, denied suprapubic or CVA or lower back tenderness or discomfort. May consider Urology consultation if recurs. Last Urology visit was about 4-5 years ago @ [facility].     Gout, unspecified No flare ups. Takes Allopurinol 50mg  daily.       Coronary atherosclerosis of unspecified type of vessel, native or graft currently no chest pain continue home medications     Hypertrophy of prostate with urinary obstruction and other lower urinary tract symptoms (LUTS) Nocturnal urinary frequency with urgency.   Pruritus Itching skin w/o apparent rash. Will have Hydrocortisone cream to affected area nightly for symptomatic relief.   Allergic rhinitis Vs vasomotor rhinitis since clear nasal drainage worsens at meals: will try Atovent nasal spray I spray each nostril ac meals.   CVA (cerebral infarction) Diffuse atherosclerotic irregularity and plaque formation of the distal right common carotid artery and proximal internal carotid artery without significant stenosis. Plaque ulceration is present and is a source of emboli-02/19/13 angiography brain 02/19/13. Mild right acial  paralysis and unsteady gait presents, otherwise he is at his baseline mentation and physical condition. Here for Rehab--[place] or [place] is goal of Rehab.       Family/ Staff Communication: observe the patient  Goals of Care: [place] or [place]  Labs/tests ordered: none

## 2013-03-11 NOTE — Assessment & Plan Note (Signed)
Controlled on Avapro 300mg , Metoprolol 100mg  bid

## 2013-03-11 NOTE — Assessment & Plan Note (Signed)
No flare ups. Takes Allopurinol 50mg daily.   

## 2013-03-11 NOTE — Assessment & Plan Note (Signed)
chronic and stable Na 130s

## 2013-03-11 NOTE — Assessment & Plan Note (Signed)
Diet controlled, Hgb A1c6.0 01/08/13

## 2013-03-11 NOTE — Assessment & Plan Note (Signed)
Takes Levothyroxine 178mcg, TSH 3.597 01/08/13

## 2013-03-11 NOTE — Assessment & Plan Note (Signed)
currently no chest pain continue home medications

## 2013-03-11 NOTE — Assessment & Plan Note (Signed)
Difficulty falling asleep--takes Ativan almost routinely-occasional takes Ambien. Has more antsy and episodes of dizziness associated with hyperventilation. Lorazepam 0.5mg hs prn and q6hr prn available to him.   

## 2013-03-11 NOTE — Assessment & Plan Note (Signed)
Nocturnal urinary frequency with urgency.

## 2013-03-11 NOTE — Assessment & Plan Note (Signed)
Diffuse atherosclerotic irregularity and plaque formation of the distal right common carotid artery and proximal internal carotid artery without significant stenosis. Plaque ulceration is present and is a source of emboli-02/19/13 angiography brain 02/19/13. Mild right acial paralysis and unsteady gait presents, otherwise he is at his baseline mentation and physical condition. Here for Rehab--[place] or [place] is goal of Rehab.

## 2013-03-11 NOTE — Assessment & Plan Note (Signed)
Itching skin w/o apparent rash. Will have Hydrocortisone cream to affected area nightly for symptomatic relief.

## 2013-03-11 NOTE — Assessment & Plan Note (Signed)
2-3x/night bathroom trips with urgency and leakage--participated Kegel exercise. Urinary incontinence x1 03/07/13 night w/o dysuria. No further occurrence. Afebrile, denied suprapubic or CVA or lower back tenderness or discomfort. May consider Urology consultation if recurs. Last Urology visit was about 4-5 years ago @ [facility].

## 2013-03-11 NOTE — Assessment & Plan Note (Signed)
continue apixiban, currently rate controlled

## 2013-03-11 NOTE — Assessment & Plan Note (Signed)
stable, remains eu volemic, takes HCTZ 25mg  daily.

## 2013-03-15 ENCOUNTER — Ambulatory Visit: Payer: Medicare Other | Admitting: Pharmacist Clinician (PhC)/ Clinical Pharmacy Specialist

## 2013-03-20 ENCOUNTER — Encounter: Payer: Self-pay | Admitting: Internal Medicine

## 2013-03-20 ENCOUNTER — Ambulatory Visit (INDEPENDENT_AMBULATORY_CARE_PROVIDER_SITE_OTHER): Payer: Medicare Other | Admitting: Internal Medicine

## 2013-03-20 VITALS — BP 126/84 | Ht 68.0 in | Wt 167.8 lb

## 2013-03-20 DIAGNOSIS — I4891 Unspecified atrial fibrillation: Secondary | ICD-10-CM

## 2013-03-20 DIAGNOSIS — Z79899 Other long term (current) drug therapy: Secondary | ICD-10-CM

## 2013-03-20 DIAGNOSIS — E785 Hyperlipidemia, unspecified: Secondary | ICD-10-CM | POA: Diagnosis not present

## 2013-03-20 DIAGNOSIS — R0602 Shortness of breath: Secondary | ICD-10-CM | POA: Diagnosis not present

## 2013-03-20 DIAGNOSIS — I10 Essential (primary) hypertension: Secondary | ICD-10-CM

## 2013-03-20 DIAGNOSIS — I719 Aortic aneurysm of unspecified site, without rupture: Secondary | ICD-10-CM

## 2013-03-20 DIAGNOSIS — I639 Cerebral infarction, unspecified: Secondary | ICD-10-CM

## 2013-03-20 DIAGNOSIS — I2510 Atherosclerotic heart disease of native coronary artery without angina pectoris: Secondary | ICD-10-CM | POA: Diagnosis not present

## 2013-03-20 DIAGNOSIS — I6350 Cerebral infarction due to unspecified occlusion or stenosis of unspecified cerebral artery: Secondary | ICD-10-CM

## 2013-03-20 DIAGNOSIS — Z950 Presence of cardiac pacemaker: Secondary | ICD-10-CM

## 2013-03-20 MED ORDER — FUROSEMIDE 20 MG PO TABS: 20.0000 mg | ORAL_TABLET | Freq: Every day | ORAL | Status: DC

## 2013-03-20 NOTE — Progress Notes (Signed)
OFFICE NOTE  Chief Complaint:  Routine followup  Primary Care Physician: [name], MD  HPI:  Jon [name]  is an 80 yo male formerly followed by Dr. Rex Kras and recently seen by Cecilie Kicks, NP, for left lower extremity edema with discoloration of his toes, was beginning to be uncomfortable for him to walk. We did venous Dopplers. He was quite concerned about arterial blood supply. His mother ended up with bilateral amputations. Venous Dopplers showed a ruptured baker cyst, no DVT. He was instructed on this and since that time his symptoms have resolved completely. He is on Coumadin for paroxysmal A-fib which led to the discoloration in his toes. Additionally, he has a history of abdominal aortic aneurysm and because he was concerned about his arterial disease, if he had it, we did Dopplers.  He is here for the results of the Dopplers. Bilateral ABIs were 1.0, though he does have appearance of an occluded right anterior tibial artery and a left posterior tib demonstrated a short segment of occlusive disease with reconstitution at the ankle. His pulses have been 2+. He has no claudication symptoms. Additionally, the duplex of his abdominal aorta was slightly, minimally changed from his last one. Previously it was 3.95 x 3.57, now he is 3.7 x 4.0, essentially the same, very slight increase. He has no abdominal pain and no other complaints. He has really no complaints today, feels quite well. He is not aware of any tachycardias or palpitations. He has a Medtronic pacemaker which was interrogated today. This demonstrates a battery voltage of 3.0V.  His a-fib burden is 1.6% and he is on amiodarone.  Other history includes bypass grafting in 1989; vein graft was stented in 2008. Last stress test was 2012, low risk study. EF was 49%. Pacemaker was placed for paroxysmal A-fib and bradycardia, sick sinus syndrome in 2011. He is also on warfarin.   Jon [name] underwent PFTs on 10/22/2012. This  showed a moderately severe restrictive limitation as well as moderately reduced diffusion capacity. Based on these findings I recommended discontinuing his amiodarone and he also stopped his pravastatin. Since that time he's noted that he feels quite a bit better including some minor improvement in his knee pain. He still feels like his left knee is bothering him and he is status post right TKR. 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 I did refer him to the [facility], but has not been contacted for appointment so far.  Since his last followup, Jon [name] was seen by Dr. Sallyanne Kuster for a device check in his device appeared to be working properly. He was having atrial fibrillation, but a little burden. He continued on warfarin which was generally therapeutic. Unfortunately, in December he had a thalamic stroke which resulted in some right facial droop and hemiparesis. That is improving , but slowly. He was then changed to Eliquis for anticoagulation. He was also started on Zetia, as he has a history of statin intolerance in the past.  An echo performed in the hospital demonstrated an EF of 40-45%, which is mildly reduced from previous studies. In addition, he has had a small amount of weight gain and lower extremity swelling which is noted over the past several weeks while in recovery.  PMHx:  Past Medical History  Diagnosis Date  . Hypertension   . Coronary artery disease   . Arthritis   . Cataract   . Anxiety   . Blood transfusion without  reported diagnosis   . Hyperlipidemia   . Heart murmur   . Allergy   . Thyroid disease   . PAF (paroxysmal atrial fibrillation)     coumadin  . History of abdominal aortic aneurysm   . History of echocardiogram 12/2008    EF >55%; mild concentric LVH; mild MR; mild-mod TR; mild AV regurg;   . History of nuclear stress test 12/2010    lexiscan; low risk; compared to prior study, perfusion improved  . History of Doppler ultrasound  05/22/2012    LEAs; R anterior tibial artery appeared occluded; L posterior tibial shows short segment of occlusive ds  . History of Doppler ultrasound 05/22/2012    Abdominal Aortic Doppler; slight increase in fusiform aneurysm     Past Surgical History  Procedure Laterality Date  . Coronary artery bypass graft  1989  . Pacemaker insertion  2011  . Insert / replace / remove pacemaker    . Eye surgery    . Joint replacement    . Hernia repair    . Coronary angioplasty with stent placement  2008    stent to SVG to OM    FAMHx:  Family History  Problem Relation Age of Onset  . Diabetes type II Father   . Other Mother     PAD with amputations    SOCHx:   reports that he quit smoking about 46 years ago. His smoking use included Cigarettes. He has a 11.25 pack-year smoking history. He has never used smokeless tobacco. He reports that he does not drink alcohol or use illicit drugs.  ALLERGIES:  Allergies  Allergen Reactions  . Caduet [Amlodipine-Atorvastatin]     Weakness   . Crestor [Rosuvastatin]   . Cymbalta [Duloxetine Hcl] Other (See Comments)    Excessive sedation  . Lipitor [Atorvastatin Calcium]     Muscle pain  . Lisinopril   . Simvastatin     Muscle pain    ROS: A comprehensive review of systems was negative except for: Constitutional: positive for fatigue Respiratory: positive for dyspnea on exertion Cardiovascular: positive for irregular heart beat and lower extremity edema  HOME MEDS: Current Outpatient Prescriptions  Medication Sig Dispense Refill  . allopurinol (ZYLOPRIM) 100 MG tablet Take 50 mg by mouth every morning.      Marland Kitchen amoxicillin (AMOXIL) 500 MG tablet Take 500 mg by mouth. Take four tablets one hour prior to dental procedures.      Marland Kitchen apixaban (ELIQUIS) 2.5 MG TABS tablet Take 1 tablet (2.5 mg total) by mouth 2 (two) times daily.  62 tablet  0  . aspirin EC 81 MG tablet Take 81 mg by mouth daily.        . Cholecalciferol (VITAMIN D) 400 UNITS  capsule Take 400 Units by mouth 2 (two) times daily.       . diphenhydrAMINE (BENADRYL) 25 MG tablet Take 25 mg by mouth every 6 (six) hours as needed for itching.      . ezetimibe (ZETIA) 10 MG tablet Take 1 tablet (10 mg total) by mouth daily.  30 tablet  0  . ipratropium (ATROVENT) 0.03 % nasal spray Place 2 sprays into both nostrils 3 (three) times daily.      Marland Kitchen levothyroxine (SYNTHROID, LEVOTHROID) 75 MCG tablet Take 75 mcg by mouth daily before breakfast.      . LORazepam (ATIVAN) 0.5 MG tablet Take 1 tablet (0.5 mg total) by mouth at bedtime as needed for anxiety or sleep.  15 tablet  0  . losartan (COZAAR) 100 MG tablet Take 100 mg by mouth daily.      . metoprolol (LOPRESSOR) 100 MG tablet Take 100 mg by mouth 2 (two) times daily.       . nitroGLYCERIN (NITROSTAT) 0.4 MG SL tablet Place 0.4 mg under the tongue every 5 (five) minutes as needed. For chest pain       . polyvinyl alcohol (LIQUIFILM TEARS) 1.4 % ophthalmic solution Place 1 drop into both eyes 4 (four) times daily as needed for dry eyes.      . potassium chloride (MICRO-K) 10 MEQ CR capsule Take 10 mEq by mouth 2 (two) times daily.      Marland Kitchen zolpidem (AMBIEN) 10 MG tablet Take 2.5-5 mg by mouth at bedtime as needed for sleep.      . furosemide (LASIX) 20 MG tablet Take 1 tablet (20 mg total) by mouth daily.  90 tablet  3   No current facility-administered medications for this visit.    LABS/IMAGING: No results found for this or any previous visit (from the past 48 hour(s)). No results found.  VITALS: BP 126/84  Ht 5\' 8"  (1.727 m)  Wt 167 lb 12.8 oz (76.114 kg)  BMI 25.52 kg/m2  EXAM: GEN: Awake, NAD HEENT: PERRLA, right lid appears retracted, right eye proptotic PULM: lungs with decreased BS at bases CV: Regular rate and rhythm, s1/s2 GI: Abdomen soft, non-tender, +BS VASCULAR: 2+ pulses, right CEA incision noted NEURO: right facial droop, slightly slurred speech PSYCH: Mood, affect normal, pleasant MSK: good ROM,  strength 5/5 LE bilaterally, right handgrip 4/5 DERM: chronic LE venous stasis changes, bilateral 1+ LE Edema  EKG: Paced rhythm at 92  ASSESSMENT: 1. Chronic atrial fibrillation 2. Sick sinus syndrome status post pacemaker placement with normal function 3. Abnormal PFT's - off of amiodarone 4. History of abdominal aortic aneurysm with mild enlargement 5. Coronary artery disease status post CABG in 1989 with stent to the vein graft in 2008 6. Minimal PAD with normal ABIs bilaterally 7. Recent thalamic stroke - now on Eliquis 8. Acute on chronic, systolic congestive heart failure - EF 40-45%  PLAN: 1.   Jon [name] has a number of active medical problems, including what appears to be some mild acute on chronic systolic congestive heart failure. His EF has worsened slightly to 40-45%. He was on HCTZ, and I would like to discontinue that today and placed him on Lasix 20 mg daily. He is pacemaker appears to be functioning normally. Unfortunately he recently had a stroke and is now on Eliquis instead of warfarin. He seems to tolerate that without any difficulty and has been maintained on aspirin, which I agree with given his history of prior coronary artery disease and bypass surgery. He has denied any further chest pain. Unfortunately, he is intolerant to statins and is only on Zetia at this time. I would like to go ahead and recheck a lipid profile to see if there is still work to be done to lower his LDL cholesterol to a goal less than 70. We may need to try him on Pitavastatin.  Finally will recheck a BMP and BNP next week on Lasix to make sure he is tolerating that.  He is asked to follow his weights at home and we will inquire about those next week.  Plan to see him back in 6 months or sooner as necessary.  Greater than 40 minutes was spent reviewing hospital discharge records personally, along with making medication  adjustments and reviewing his recent device interrogation data.  Pixie Casino, MD, [facility] Attending Cardiologist [facility] 03/20/2013, 12:14 PM

## 2013-03-20 NOTE — Patient Instructions (Signed)
Your physician has recommended you make the following change in your medication: STOP hydrochlorothiazide. START furosemide (Lasix) 20mg  once daily.  Your physician recommends that you return for lab work on Monday or Tuesday - at [place] will need to be fasting (nothing to eat/drink after midnight) Lipid, BMET, BNP  We will call you with the results.   Please monitor your daily weights and let us know what those numbers are when we call you with the results of your blood work.   Your physician wants you to follow-up in: 6 months with Dr. Debara Pickett. You will receive a reminder letter in the mail two months in advance. If you don't receive a letter, please call our office to schedule the follow-up appointment.

## 2013-03-21 ENCOUNTER — Encounter: Payer: Self-pay | Admitting: Internal Medicine

## 2013-03-25 DIAGNOSIS — R488 Other symbolic dysfunctions: Secondary | ICD-10-CM | POA: Diagnosis not present

## 2013-03-25 DIAGNOSIS — R1312 Dysphagia, oropharyngeal phase: Secondary | ICD-10-CM | POA: Diagnosis not present

## 2013-03-26 ENCOUNTER — Other Ambulatory Visit: Payer: Self-pay | Admitting: *Deleted

## 2013-03-26 DIAGNOSIS — Z79899 Other long term (current) drug therapy: Secondary | ICD-10-CM | POA: Diagnosis not present

## 2013-03-26 DIAGNOSIS — R0602 Shortness of breath: Secondary | ICD-10-CM | POA: Diagnosis not present

## 2013-03-26 DIAGNOSIS — E785 Hyperlipidemia, unspecified: Secondary | ICD-10-CM | POA: Diagnosis not present

## 2013-03-26 LAB — LIPID PANEL
Cholesterol: 89 mg/dL (ref 0–200)
HDL: 31 mg/dL — AB (ref 35–70)
LDL Cholesterol: 47 mg/dL
Triglycerides: 54 mg/dL (ref 40–160)

## 2013-03-26 LAB — BASIC METABOLIC PANEL
BUN: 23 mg/dL — AB (ref 4–21)
Creatinine: 1.3 mg/dL (ref 0.6–1.3)
Glucose: 105 mg/dL
Potassium: 4.5 mmol/L (ref 3.4–5.3)
SODIUM: 137 mmol/L (ref 137–147)

## 2013-03-26 MED ORDER — APIXABAN 2.5 MG PO TABS: 2.5000 mg | ORAL_TABLET | Freq: Two times a day (BID) | ORAL | Status: DC

## 2013-03-26 MED ORDER — LOSARTAN POTASSIUM 100 MG PO TABS: 100.0000 mg | ORAL_TABLET | Freq: Every day | ORAL | Status: DC

## 2013-03-26 MED ORDER — EZETIMIBE 10 MG PO TABS: 10.0000 mg | ORAL_TABLET | Freq: Every day | ORAL | Status: DC

## 2013-03-27 ENCOUNTER — Encounter: Payer: Self-pay | Admitting: Cardiology

## 2013-03-27 ENCOUNTER — Ambulatory Visit (INDEPENDENT_AMBULATORY_CARE_PROVIDER_SITE_OTHER): Payer: Medicare Other | Admitting: Cardiology

## 2013-03-27 VITALS — BP 124/82 | HR 67 | Ht 68.0 in | Wt 167.4 lb

## 2013-03-27 DIAGNOSIS — I4891 Unspecified atrial fibrillation: Secondary | ICD-10-CM

## 2013-03-27 DIAGNOSIS — I2510 Atherosclerotic heart disease of native coronary artery without angina pectoris: Secondary | ICD-10-CM

## 2013-03-27 DIAGNOSIS — R5381 Other malaise: Secondary | ICD-10-CM | POA: Diagnosis not present

## 2013-03-27 DIAGNOSIS — G4733 Obstructive sleep apnea (adult) (pediatric): Secondary | ICD-10-CM

## 2013-03-27 DIAGNOSIS — Z79899 Other long term (current) drug therapy: Secondary | ICD-10-CM | POA: Diagnosis not present

## 2013-03-27 DIAGNOSIS — I6350 Cerebral infarction due to unspecified occlusion or stenosis of unspecified cerebral artery: Secondary | ICD-10-CM | POA: Diagnosis not present

## 2013-03-27 DIAGNOSIS — Z950 Presence of cardiac pacemaker: Secondary | ICD-10-CM

## 2013-03-27 DIAGNOSIS — I639 Cerebral infarction, unspecified: Secondary | ICD-10-CM

## 2013-03-27 DIAGNOSIS — R1312 Dysphagia, oropharyngeal phase: Secondary | ICD-10-CM | POA: Diagnosis not present

## 2013-03-27 DIAGNOSIS — Z7901 Long term (current) use of anticoagulants: Secondary | ICD-10-CM

## 2013-03-27 DIAGNOSIS — R5383 Other fatigue: Secondary | ICD-10-CM

## 2013-03-27 DIAGNOSIS — R488 Other symbolic dysfunctions: Secondary | ICD-10-CM | POA: Diagnosis not present

## 2013-03-27 DIAGNOSIS — I480 Paroxysmal atrial fibrillation: Secondary | ICD-10-CM

## 2013-03-27 LAB — CBC
HCT: 47.2 % (ref 39.0–52.0)
Hemoglobin: 16.5 g/dL (ref 13.0–17.0)
MCH: 32.7 pg (ref 26.0–34.0)
MCHC: 35 g/dL (ref 30.0–36.0)
MCV: 93.7 fL (ref 78.0–100.0)
Platelets: 241 10*3/uL (ref 150–400)
RBC: 5.04 MIL/uL (ref 4.22–5.81)
RDW: 13.8 % (ref 11.5–15.5)
WBC: 9 10*3/uL (ref 4.0–10.5)

## 2013-03-27 MED ORDER — METOPROLOL TARTRATE 100 MG PO TABS: 50.0000 mg | ORAL_TABLET | Freq: Two times a day (BID) | ORAL | Status: DC

## 2013-03-27 NOTE — Patient Instructions (Signed)
Decrease Metoprolol to 50 mg twice a day. Check labs today. See Dr Sallyanne Kuster in 4 weeks.

## 2013-03-27 NOTE — Assessment & Plan Note (Signed)
Recurrent. He is in AF today. Amiodarone stopped Aug 2014 secondary to abnormal PFTs.

## 2013-03-27 NOTE — Assessment & Plan Note (Signed)
Main complaint today is fatigue and DOE since discharge in December after CVA

## 2013-03-27 NOTE — Assessment & Plan Note (Addendum)
Coumadin changed to Eloquis after his recent CVA

## 2013-03-27 NOTE — Assessment & Plan Note (Signed)
Medtronic revo implant in October 2011

## 2013-03-27 NOTE — Assessment & Plan Note (Signed)
Rt facial droop and Rt arm and Rt leg weakness 02/19/13 in setting of INR of 2.0

## 2013-03-27 NOTE — Assessment & Plan Note (Signed)
C-pap intol 

## 2013-03-27 NOTE — Assessment & Plan Note (Signed)
No angina 

## 2013-03-27 NOTE — Progress Notes (Signed)
03/27/2013 Jon [name]   12/28/1921  [id number]  Primary Physicia GREEN, Viviann Spare, MD Primary Cardiologist: Dr Hilty/ [facility]  HPI:  80 y/o previously followed by Dr Rex Kras with a history of CAD. He had CABG in '89. In '08 he had a DES to the SVG-OM1/OM2. Myoview in 2012 was low risk. He has PAF and had been in NSR. In Aug 2014 his PFTs came back abnormal with decreased DLCO and it was felt it would be best to take him off Amiodarone. He was in NSR when he saw Dr Sallyanne Kuster in November. He was admitted to the hospital 02/18/13 with a stroke. He had Rt facial droop as well as weakness in his Rt arm and leg. CT was unremarkable for a new stroke but showed chronic thalamic stroke. MRI could not be done secondary to his pacemaker. His INR on admission was 2.0. Echo showed an EF of 40-45%.              Since discharge he has been fatigued. He says he has dyspnea with any exertion. He is not having angina or orthopnea. His EKG shows AF with pacing. Apparently in the hospital his Metoprolol was increased to 100 mg BID.    Current Outpatient Prescriptions  Medication Sig Dispense Refill  . allopurinol (ZYLOPRIM) 100 MG tablet Take 50 mg by mouth every morning.      Marland Kitchen amoxicillin (AMOXIL) 500 MG tablet Take 500 mg by mouth. Take four tablets one hour prior to dental procedures.      Marland Kitchen apixaban (ELIQUIS) 2.5 MG TABS tablet Take 1 tablet (2.5 mg total) by mouth 2 (two) times daily.  62 tablet  5  . aspirin EC 81 MG tablet Take 81 mg by mouth daily.        . Cholecalciferol (VITAMIN D) 400 UNITS capsule Take 400 Units by mouth 2 (two) times daily.       . diphenhydrAMINE (BENADRYL) 25 MG tablet Take 25 mg by mouth every 6 (six) hours as needed for itching.      . ezetimibe (ZETIA) 10 MG tablet Take 1 tablet (10 mg total) by mouth daily.  30 tablet  5  . furosemide (LASIX) 20 MG tablet Take 1 tablet (20 mg total) by mouth daily.  90 tablet  3  . ipratropium (ATROVENT) 0.03 % nasal spray Place 2 sprays  into both nostrils 3 (three) times daily.      Marland Kitchen levothyroxine (SYNTHROID, LEVOTHROID) 75 MCG tablet Take 75 mcg by mouth daily before breakfast.      . LORazepam (ATIVAN) 0.5 MG tablet Take 1 tablet (0.5 mg total) by mouth at bedtime as needed for anxiety or sleep.  15 tablet  0  . losartan (COZAAR) 100 MG tablet Take 1 tablet (100 mg total) by mouth daily.  30 tablet  5  . metoprolol (LOPRESSOR) 100 MG tablet Take 0.5 tablets (50 mg total) by mouth 2 (two) times daily.      . nitroGLYCERIN (NITROSTAT) 0.4 MG SL tablet Place 0.4 mg under the tongue every 5 (five) minutes as needed. For chest pain       . polyvinyl alcohol (LIQUIFILM TEARS) 1.4 % ophthalmic solution Place 1 drop into both eyes 4 (four) times daily as needed for dry eyes.      . potassium chloride (MICRO-K) 10 MEQ CR capsule Take 10 mEq by mouth 2 (two) times daily.      Marland Kitchen zolpidem (AMBIEN) 10 MG tablet Take 2.5-5 mg by  mouth at bedtime as needed for sleep.       No current facility-administered medications for this visit.    Allergies  Allergen Reactions  . Caduet [Amlodipine-Atorvastatin]     Weakness   . Crestor [Rosuvastatin]   . Cymbalta [Duloxetine Hcl] Other (See Comments)    Excessive sedation  . Lipitor [Atorvastatin Calcium]     Muscle pain  . Lisinopril   . Simvastatin     Muscle pain    History   Social History  . Marital Status: Married    Spouse Name: [name]    Number of Children: 3  . Years of Education: [name]   Occupational History  . Retired Armed forces operational officer    Social History Main Topics  . Smoking status: Former Smoker -- 0.75 packs/day for 15 years    Types: Cigarettes    Quit date: 03/04/1967  . Smokeless tobacco: Never Used  . Alcohol Use: No  . Drug Use: No  . Sexual Activity: No   Other Topics Concern  . Not on file   Social History Narrative  . No narrative on file     Review of Systems: General: negative for chills, fever, night sweats or weight changes.  Cardiovascular:  negative for chest pain, dyspnea on exertion, edema, orthopnea, palpitations, paroxysmal nocturnal dyspnea or shortness of breath Dermatological: negative for rash Respiratory: negative for cough or wheezing Urologic: negative for hematuria Abdominal: negative for nausea, vomiting, diarrhea, bright red blood per rectum, melena, or hematemesis Neurologic: negative for visual changes, syncope, or dizziness All other systems reviewed and are otherwise negative except as noted above.    Blood pressure 124/82, pulse 67, height 5\' 8"  (1.727 m), weight 167 lb 6.4 oz (75.932 kg).  General appearance: alert, cooperative, no distress and Rt facial droop and slurred speech. Neck: no carotid bruit and no JVD Lungs: clear to auscultation bilaterally Heart: regular rate and rhythm and soft MR murmur Extremities: no edema  EKG AF, V-paced  ASSESSMENT AND PLAN:   Fatigue Main complaint today is fatigue and DOE since discharge in December after CVA  CVA (cerebral infarction) Rt facial droop and Rt arm and Rt leg weakness 02/19/13 in setting of INR of 2.0  CABG '89, SVG-OM DES '08, low risk Nuc 2012 No angina  Cardiac pacemaker in situ Medtronic revo implant in October 2011  Obstructive sleep apnea (adult) (pediatric) C-pap intol  Atrial fibrillation Recurrent. He is in AF today. Amiodarone stopped Aug 2014 secondary to abnormal PFTs.   Long term (current) use of anticoagulants Coumadin changed to Eloquis after his recent CVA   PLAN  I discussed Mr Licciardi case with Dr Sallyanne Kuster. We offered admission for Sotalol loading and cardioversion, (with TEE), but the pt declined. I did cut his Metoprolol back to 50 mg BID and checked labs today. He will see Dr Sallyanne Kuster in a few weeks.   [facility] 03/27/2013 5:32 PM

## 2013-03-28 LAB — MAGNESIUM: Magnesium: 2.3 mg/dL (ref 1.5–2.5)

## 2013-03-28 LAB — COMPREHENSIVE METABOLIC PANEL
ALT: 19 U/L (ref 0–53)
AST: 22 U/L (ref 0–37)
Albumin: 4.3 g/dL (ref 3.5–5.2)
Alkaline Phosphatase: 81 U/L (ref 39–117)
BUN: 25 mg/dL — ABNORMAL HIGH (ref 6–23)
CO2: 25 mEq/L (ref 19–32)
Calcium: 9.7 mg/dL (ref 8.4–10.5)
Chloride: 99 mEq/L (ref 96–112)
Creat: 1.18 mg/dL (ref 0.50–1.35)
Glucose, Bld: 107 mg/dL — ABNORMAL HIGH (ref 70–99)
Potassium: 4.7 mEq/L (ref 3.5–5.3)
Sodium: 134 mEq/L — ABNORMAL LOW (ref 135–145)
Total Bilirubin: 1.1 mg/dL (ref 0.3–1.2)
Total Protein: 6.8 g/dL (ref 6.0–8.3)

## 2013-03-28 LAB — TSH: TSH: 6.812 u[IU]/mL — ABNORMAL HIGH (ref 0.350–4.500)

## 2013-04-01 DIAGNOSIS — R488 Other symbolic dysfunctions: Secondary | ICD-10-CM | POA: Diagnosis not present

## 2013-04-01 DIAGNOSIS — R1312 Dysphagia, oropharyngeal phase: Secondary | ICD-10-CM | POA: Diagnosis not present

## 2013-04-03 ENCOUNTER — Telehealth: Payer: Self-pay | Admitting: Internal Medicine

## 2013-04-03 NOTE — Telephone Encounter (Signed)
Message forwarded to Medical Records to process ROI

## 2013-04-03 NOTE — Telephone Encounter (Signed)
She wants to know if you could fax his lab results to Dr Jeanmarie Hubert please. [phone]

## 2013-04-03 NOTE — Progress Notes (Signed)
Pt. Informed of lab results.

## 2013-04-04 ENCOUNTER — Telehealth: Payer: Self-pay

## 2013-04-04 DIAGNOSIS — R488 Other symbolic dysfunctions: Secondary | ICD-10-CM | POA: Diagnosis not present

## 2013-04-04 DIAGNOSIS — R1312 Dysphagia, oropharyngeal phase: Secondary | ICD-10-CM | POA: Diagnosis not present

## 2013-04-04 NOTE — Telephone Encounter (Signed)
Patient called asking which Synthroid to take, prior to the hospital he was on 170mcg, after discharged from [facility] they gave him 14mcg. Dr. Lysbeth Penner office called him with TSH results 6.812 done 04/03/13.  Per [facility] Mast take the 157mcg synthroid, repeat TSH in 6 weeks (05/16/13). Called patient back with information. Lab sheet filled out for [facility] clinic nurse to do TSH on 05/16/13.

## 2013-04-10 ENCOUNTER — Encounter: Payer: Medicare Other | Admitting: *Deleted

## 2013-04-10 DIAGNOSIS — R488 Other symbolic dysfunctions: Secondary | ICD-10-CM | POA: Diagnosis not present

## 2013-04-10 DIAGNOSIS — R1312 Dysphagia, oropharyngeal phase: Secondary | ICD-10-CM | POA: Diagnosis not present

## 2013-04-11 ENCOUNTER — Encounter: Payer: Self-pay | Admitting: Internal Medicine

## 2013-04-11 ENCOUNTER — Non-Acute Institutional Stay: Payer: Medicare Other | Admitting: Internal Medicine

## 2013-04-11 VITALS — BP 130/70 | HR 68 | Wt 160.0 lb

## 2013-04-11 DIAGNOSIS — F3289 Other specified depressive episodes: Secondary | ICD-10-CM

## 2013-04-11 DIAGNOSIS — I2510 Atherosclerotic heart disease of native coronary artery without angina pectoris: Secondary | ICD-10-CM | POA: Diagnosis not present

## 2013-04-11 DIAGNOSIS — M6281 Muscle weakness (generalized): Secondary | ICD-10-CM

## 2013-04-11 DIAGNOSIS — IMO0002 Reserved for concepts with insufficient information to code with codable children: Secondary | ICD-10-CM

## 2013-04-11 DIAGNOSIS — I69328 Other speech and language deficits following cerebral infarction: Secondary | ICD-10-CM

## 2013-04-11 DIAGNOSIS — H11149 Conjunctival xerosis, unspecified, unspecified eye: Secondary | ICD-10-CM

## 2013-04-11 DIAGNOSIS — E507 Other ocular manifestations of vitamin A deficiency: Secondary | ICD-10-CM

## 2013-04-11 DIAGNOSIS — I639 Cerebral infarction, unspecified: Secondary | ICD-10-CM

## 2013-04-11 DIAGNOSIS — E119 Type 2 diabetes mellitus without complications: Secondary | ICD-10-CM

## 2013-04-11 DIAGNOSIS — M1710 Unilateral primary osteoarthritis, unspecified knee: Secondary | ICD-10-CM

## 2013-04-11 DIAGNOSIS — L299 Pruritus, unspecified: Secondary | ICD-10-CM

## 2013-04-11 DIAGNOSIS — I6350 Cerebral infarction due to unspecified occlusion or stenosis of unspecified cerebral artery: Secondary | ICD-10-CM

## 2013-04-11 DIAGNOSIS — I4891 Unspecified atrial fibrillation: Secondary | ICD-10-CM | POA: Diagnosis not present

## 2013-04-11 DIAGNOSIS — F330 Major depressive disorder, recurrent, mild: Secondary | ICD-10-CM | POA: Insufficient documentation

## 2013-04-11 DIAGNOSIS — R5381 Other malaise: Secondary | ICD-10-CM

## 2013-04-11 DIAGNOSIS — F419 Anxiety disorder, unspecified: Secondary | ICD-10-CM | POA: Insufficient documentation

## 2013-04-11 DIAGNOSIS — E559 Vitamin D deficiency, unspecified: Secondary | ICD-10-CM

## 2013-04-11 DIAGNOSIS — E039 Hypothyroidism, unspecified: Secondary | ICD-10-CM | POA: Diagnosis not present

## 2013-04-11 DIAGNOSIS — R269 Unspecified abnormalities of gait and mobility: Secondary | ICD-10-CM

## 2013-04-11 DIAGNOSIS — F4323 Adjustment disorder with mixed anxiety and depressed mood: Secondary | ICD-10-CM

## 2013-04-11 DIAGNOSIS — I69928 Other speech and language deficits following unspecified cerebrovascular disease: Secondary | ICD-10-CM

## 2013-04-11 DIAGNOSIS — E785 Hyperlipidemia, unspecified: Secondary | ICD-10-CM

## 2013-04-11 DIAGNOSIS — R413 Other amnesia: Secondary | ICD-10-CM

## 2013-04-11 DIAGNOSIS — I10 Essential (primary) hypertension: Secondary | ICD-10-CM | POA: Diagnosis not present

## 2013-04-11 DIAGNOSIS — F329 Major depressive disorder, single episode, unspecified: Secondary | ICD-10-CM

## 2013-04-11 DIAGNOSIS — R5383 Other fatigue: Secondary | ICD-10-CM

## 2013-04-11 NOTE — Progress Notes (Signed)
Patient ID: Jon [name], male   DOB: 28-Dec-1921, 80 y.o.   MRN: [id number]    Location:  [facility]   Place of Service: Clinic (12)    Allergies  Allergen Reactions  . Caduet [Amlodipine-Atorvastatin]     Weakness   . Crestor [Rosuvastatin]   . Cymbalta [Duloxetine Hcl] Other (See Comments)    Excessive sedation  . Lipitor [Atorvastatin Calcium]     Muscle pain  . Lisinopril   . Simvastatin     Muscle pain    Chief Complaint  Patient presents with  . Medical Managment of Chronic Issues    blood pressure, thyroid, blood sugar  . Cerebrovascular Accident    was in hospital 02/18/13 to 02/21/13, small right thalamic CVA, hponatremia  . Abnormal Lab    TSH done 04/01/13 6.812 done at Dr. Debara Pickett office, patient was confused on which dose of Synthroid to take, $Remove'75mg'[id number]$  or $R'100mg'kN$ . Repeat TSH on 05/16/13    HPI:  HTN (hypertension) -controlled  Atrial fibrillation: chronic. Rate controlled.  Hypothyroidism: compensated  Type II or unspecified type diabetes mellitus without mention of complication, not stated as uncontrolled: controlled  Other and unspecified hyperlipidemia: controlled  CVA (cerebral infarction): Hospitalized 02/18/13 to 02/21/13 for small right thalamic CVA. Continues with residual speech deficit, right side weakness, increased gait instability, depression.  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 Continues to work with [employer].  Abnormality of gait: using walker. Weak and uncomfortable at the right knee. Uses an external brace.  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   AMOXICILLIN (AMOXIL) 500 MG TABLET    Take 500 mg by mouth. Take four tablets one hour prior to dental procedures.   APIXABAN (ELIQUIS) 2.5 MG TABS TABLET    Take 1 tablet (2.5 mg total) by mouth 2 (two) times daily.   ASPIRIN EC 81 MG TABLET    Take 81 mg by mouth daily.     CHOLECALCIFEROL (VITAMIN D) 400 UNITS CAPSULE    Take 400 Units by mouth 2 (two) times daily.    DIPHENHYDRAMINE (BENADRYL) 25 MG TABLET    Take 25 mg by mouth every 6 (six) hours as needed for itching.   FUROSEMIDE (LASIX) 20 MG TABLET    Take 1 tablet (20 mg total) by mouth daily.   IPRATROPIUM (ATROVENT) 0.03 % NASAL SPRAY    Place 2 sprays into both nostrils 3 (three) times daily.   LEVOTHYROXINE (SYNTHROID, LEVOTHROID) 100 MCG TABLET    Take 100 mcg by mouth daily before breakfast.   LORAZEPAM (ATIVAN) 0.5 MG TABLET    Take 1 tablet (0.5 mg total) by mouth at bedtime as needed for anxiety or sleep.   LOSARTAN (COZAAR) 100 MG TABLET    Take 1 tablet (  100 mg total) by mouth daily.   METOPROLOL (LOPRESSOR) 100 MG TABLET    Take 0.5 tablets (50 mg total) by mouth 2 (two) times daily.   NITROGLYCERIN (NITROSTAT) 0.4 MG SL TABLET    Place 0.4 mg under the tongue every 5 (five) minutes as needed. For chest pain    POLYVINYL ALCOHOL (LIQUIFILM TEARS) 1.4 % OPHTHALMIC SOLUTION    Place 1 drop into both eyes 4 (four) times daily as needed for dry eyes.   POTASSIUM CHLORIDE (MICRO-K) 10 MEQ CR CAPSULE    Take 10 mEq by mouth 2 (two) times daily.   ZOLPIDEM (AMBIEN) 10 MG TABLET    Take 2.5-5 mg by mouth at  bedtime as needed for sleep.  Modified Medications   No medications on file  Discontinued Medications   EZETIMIBE (ZETIA) 10 MG TABLET    Take 1 tablet (10 mg total) by mouth daily.   LEVOTHYROXINE (SYNTHROID, LEVOTHROID) 75 MCG TABLET    Take 75 mcg by mouth daily before breakfast.     Review of Systems  Constitutional: Negative.   HENT: Positive for hearing loss. Negative for congestion, ear discharge, ear pain, rhinorrhea, sinus pressure and tinnitus.   Eyes: Negative.   Respiratory: Negative for cough, choking, chest tightness, shortness of breath and wheezing.   Cardiovascular: Negative for palpitations and leg swelling (sometimes, not apparent today. ).  Gastrointestinal: Negative.  Negative for abdominal pain, diarrhea and abdominal distention.  Endocrine:       Hypothyroid  Genitourinary: Positive for frequency.  Musculoskeletal: Positive for arthralgias and gait problem. Negative for neck pain and neck stiffness.  Skin: Negative.  Negative for pallor and rash.  Allergic/Immunologic: Negative.   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  Hematological: Negative.   Psychiatric/Behavioral: Negative.     Filed Vitals:   04/11/13 1641  BP: 130/70  Pulse: 68  Weight: 160 lb (72.576 kg)   Physical Exam  Nursing note and vitals reviewed. Constitutional: He is oriented to person, place, and time. He appears well-developed and well-nourished. No distress.  HENT:  Severe hearing loss. Bilateral aides. Right facial droop.  Eyes:  Corrective lenses. Increased exposure of the right cornea due to post CVA facial droop.  Neck: No JVD present. No tracheal deviation present. No thyromegaly present.  Cardiovascular: Normal rate, regular rhythm and normal heart sounds.   No murmur heard. Pulmonary/Chest: Effort normal and breath sounds normal. He has no wheezes. He has no rales.  Abdominal: Soft. Bowel sounds are  normal. He exhibits no distension and no mass. There is no tenderness.  Musculoskeletal: Normal range of motion. He exhibits no edema and no tenderness.  Unstable gait. Using 4 wheel walker. Weaker on the right side.  Lymphadenopathy:    He has no cervical adenopathy.  Neurological: He is alert and oriented to person, place, and time. He displays normal reflexes. No cranial nerve deficit. He exhibits normal muscle tone. Coordination normal.  Slurred speech. Right side weakness.  Skin: Skin is warm and dry. No rash noted. No erythema. No pallor.  Left great toenail is growing out and is loose at the dista end. Had prior trauma.  Psychiatric: He has a normal mood and affect. His behavior is normal. Judgment and thought content normal.     Labs reviewed: Nursing Home on 04/11/2013  Component Date Value Range Status  . Glucose 03/26/2013 105   Final  . BUN 03/26/2013 23*  4 - 21 mg/dL Final  . Creatinine 03/26/2013 1.3  0.6 - 1.3 mg/dL Final  . Potassium 03/26/2013 4.5  3.4 - 5.3 mmol/L Final  . Sodium 03/26/2013 137  137 - 147 mmol/L Final  . Triglycerides 03/26/2013 54  40 - 160 mg/dL Final  . Cholesterol 03/26/2013 89  0 - 200 mg/dL Final  . HDL 03/26/2013 31* 35 - 70 mg/dL Final  . LDL Cholesterol 03/26/2013 47   Final  Office Visit on 03/27/2013  Component Date Value Range Status  . TSH 03/27/2013 6.812* 0.350 - 4.500 uIU/mL Final  . WBC 03/27/2013 9.0  4.0 - 10.5 K/uL Final  . RBC 03/27/2013 5.04  4.22 - 5.81 MIL/uL Final  . Hemoglobin 03/27/2013 16.5  13.0 - 17.0 g/dL Final  . HCT 03/27/2013 47.2  39.0 - 52.0 % Final  . MCV 03/27/2013 93.7  78.0 - 100.0 fL Final  . MCH 03/27/2013 32.7  26.0 - 34.0 pg Final  . MCHC 03/27/2013 35.0  30.0 - 36.0 g/dL Final  . RDW 03/27/2013 13.8  11.5 - 15.5 % Final  . Platelets 03/27/2013 241  150 - 400 K/uL Final  . Sodium 03/27/2013 134* 135 - 145 mEq/L Final  . Potassium 03/27/2013 4.7  3.5 - 5.3 mEq/L Final  . Chloride 03/27/2013 99  96 -  112 mEq/L Final  . CO2 03/27/2013 25  19 - 32 mEq/L Final  . Glucose, Bld 03/27/2013 107* 70 - 99 mg/dL Final  . BUN 03/27/2013 25* 6 - 23 mg/dL Final  . Creat 03/27/2013 1.18  0.50 - 1.35 mg/dL Final  . Total Bilirubin 03/27/2013 1.1  0.3 - 1.2 mg/dL Final  . Alkaline Phosphatase 03/27/2013 81  39 - 117 U/L Final  . AST 03/27/2013 22  0 - 37 U/L Final  . ALT 03/27/2013 19  0 - 53 U/L Final  . Total Protein 03/27/2013 6.8  6.0 - 8.3 g/dL Final  . Albumin 03/27/2013 4.3  3.5 - 5.2 g/dL Final  . Calcium 03/27/2013 9.7  8.4 - 10.5 mg/dL Final  . Magnesium 03/27/2013 2.3  1.5 - 2.5 mg/dL Final  Admission on 02/18/2013, Discharged on 02/22/2013  Component Date Value Range Status  . Prothrombin Time 02/18/2013 22.2* 11.6 - 15.2 seconds Final  . INR 02/18/2013 2.02* 0.00 - 1.49 Final  . aPTT 02/18/2013 42* 24 - 37 seconds Final   Comment:                                 IF BASELINE aPTT IS ELEVATED,                          SUGGEST PATIENT RISK ASSESSMENT                          BE USED TO DETERMINE APPROPRIATE                          ANTICOAGULANT THERAPY.  . WBC 02/18/2013 6.9  4.0 - 10.5 K/uL Final  . RBC 02/18/2013 4.67  4.22 - 5.81 MIL/uL Final  . Hemoglobin 02/18/2013 15.5  13.0 - 17.0 g/dL Final  . HCT 02/18/2013 43.1  39.0 - 52.0 % Final  . MCV 02/18/2013 92.3  78.0 - 100.0 fL Final  . MCH 02/18/2013 33.2  26.0 - 34.0 pg  Final  . MCHC 02/18/2013 36.0  30.0 - 36.0 g/dL Final  . RDW 02/18/2013 13.1  11.5 - 15.5 % Final  . Platelets 02/18/2013 217  150 - 400 K/uL Final  . Neutrophils Relative % 02/18/2013 54  43 - 77 % Final  . Neutro Abs 02/18/2013 3.7  1.7 - 7.7 K/uL Final  . Lymphocytes Relative 02/18/2013 31  12 - 46 % Final  . Lymphs Abs 02/18/2013 2.1  0.7 - 4.0 K/uL Final  . Monocytes Relative 02/18/2013 13* 3 - 12 % Final  . Monocytes Absolute 02/18/2013 0.9  0.1 - 1.0 K/uL Final  . Eosinophils Relative 02/18/2013 3  0 - 5 % Final  . Eosinophils Absolute 02/18/2013 0.2   0.0 - 0.7 K/uL Final  . Basophils Relative 02/18/2013 0  0 - 1 % Final  . Basophils Absolute 02/18/2013 0.0  0.0 - 0.1 K/uL Final  . Sodium 02/18/2013 128* 135 - 145 mEq/L Final  . Potassium 02/18/2013 3.5  3.5 - 5.1 mEq/L Final  . Chloride 02/18/2013 91* 96 - 112 mEq/L Final  . CO2 02/18/2013 26  19 - 32 mEq/L Final  . Glucose, Bld 02/18/2013 121* 70 - 99 mg/dL Final  . BUN 02/18/2013 19  6 - 23 mg/dL Final  . Creatinine, Ser 02/18/2013 1.12  0.50 - 1.35 mg/dL Final  . Calcium 02/18/2013 9.0  8.4 - 10.5 mg/dL Final  . Total Protein 02/18/2013 6.7  6.0 - 8.3 g/dL Final  . Albumin 02/18/2013 3.6  3.5 - 5.2 g/dL Final  . AST 02/18/2013 23  0 - 37 U/L Final  . ALT 02/18/2013 16  0 - 53 U/L Final  . Alkaline Phosphatase 02/18/2013 90  39 - 117 U/L Final  . Total Bilirubin 02/18/2013 0.7  0.3 - 1.2 mg/dL Final  . GFR calc non Af Amer 02/18/2013 56* >90 mL/min Final  . GFR calc Af Amer 02/18/2013 65* >90 mL/min Final   Comment: (NOTE)                          The eGFR has been calculated using the CKD EPI equation.                          This calculation has not been validated in all clinical situations.                          eGFR's persistently <90 mL/min signify possible Chronic Kidney                          Disease.  . Troponin I 02/18/2013 <0.30  <0.30 ng/mL Final   Comment:                                 Due to the release kinetics of cTnI,                          a negative result within the first hours                          of the onset of symptoms does not rule out  myocardial infarction with certainty.                          If myocardial infarction is still suspected,                          repeat the test at appropriate intervals.  . Opiates 02/18/2013 NONE DETECTED  NONE DETECTED Final  . Cocaine 02/18/2013 NONE DETECTED  NONE DETECTED Final  . Benzodiazepines 02/18/2013 NONE DETECTED  NONE DETECTED Final  . Amphetamines 02/18/2013 NONE  DETECTED  NONE DETECTED Final  . Tetrahydrocannabinol 02/18/2013 NONE DETECTED  NONE DETECTED Final  . Barbiturates 02/18/2013 NONE DETECTED  NONE DETECTED Final   Comment:                                 DRUG SCREEN FOR MEDICAL PURPOSES                          ONLY.  IF CONFIRMATION IS NEEDED                          FOR ANY PURPOSE, NOTIFY LAB                          WITHIN 5 DAYS.                                                          LOWEST DETECTABLE LIMITS                          FOR URINE DRUG SCREEN                          Drug Class       Cutoff (ng/mL)                          Amphetamine      1000                          Barbiturate      200                          Benzodiazepine   200                          Tricyclics       106                          Opiates          300                          Cocaine          300  THC              50  . Color, Urine 02/18/2013 YELLOW  YELLOW Final  . APPearance 02/18/2013 CLEAR  CLEAR Final  . Specific Gravity, Urine 02/18/2013 1.010  1.005 - 1.030 Final  . pH 02/18/2013 6.5  5.0 - 8.0 Final  . Glucose, UA 02/18/2013 NEGATIVE  NEGATIVE mg/dL Final  . Hgb urine dipstick 02/18/2013 TRACE* NEGATIVE Final  . Bilirubin Urine 02/18/2013 NEGATIVE  NEGATIVE Final  . Ketones, ur 02/18/2013 NEGATIVE  NEGATIVE mg/dL Final  . Protein, ur 02/18/2013 NEGATIVE  NEGATIVE mg/dL Final  . Urobilinogen, UA 02/18/2013 0.2  0.0 - 1.0 mg/dL Final  . Nitrite 02/18/2013 NEGATIVE  NEGATIVE Final  . Leukocytes, UA 02/18/2013 NEGATIVE  NEGATIVE Final  . Sodium 02/18/2013 129* 135 - 145 mEq/L Final  . Potassium 02/18/2013 3.4* 3.5 - 5.1 mEq/L Final  . Chloride 02/18/2013 92* 96 - 112 mEq/L Final  . BUN 02/18/2013 19  6 - 23 mg/dL Final  . Creatinine, Ser 02/18/2013 1.20  0.50 - 1.35 mg/dL Final  . Glucose, Bld 02/18/2013 127* 70 - 99 mg/dL Final  . Calcium, Ion 02/18/2013 1.14  1.13 - 1.30 mmol/L Final  . TCO2 02/18/2013 26   0 - 100 mmol/L Final  . Hemoglobin 02/18/2013 16.3  13.0 - 17.0 g/dL Final  . HCT 02/18/2013 48.0  39.0 - 52.0 % Final  . Troponin i, poc 02/18/2013 0.01  0.00 - 0.08 ng/mL Final  . Comment 3 02/18/2013          Final   Comment: Due to the release kinetics of cTnI,                          a negative result within the first hours                          of the onset of symptoms does not rule out                          myocardial infarction with certainty.                          If myocardial infarction is still suspected,                          repeat the test at appropriate intervals.  . Glucose-Capillary 02/18/2013 110* 70 - 99 mg/dL Final  . WBC, UA 02/18/2013 0-2  <3 WBC/hpf Final  . RBC / HPF 02/18/2013 0-2  <3 RBC/hpf Final  . Hemoglobin A1C 02/19/2013 6.0* <5.7 % Final   Comment: (NOTE)  According to the ADA Clinical Practice Recommendations for 2011, when                          HbA1c is used as a screening test:                           >=6.5%   Diagnostic of Diabetes Mellitus                                    (if abnormal result is confirmed)                          5.7-6.4%   Increased risk of developing Diabetes Mellitus                          References:Diagnosis and Classification of Diabetes Mellitus,Diabetes                          [id number](QQVZD 1):S62-S69 and Standards of Medical Care in                                  Diabetes - 2011,Diabetes GLOV,5643,32 (Suppl 1):S11-S61.  . Mean Plasma Glucose 02/19/2013 126* <117 mg/dL Final   Performed at [facility]  . Cholesterol 02/19/2013 187  0 - 200 mg/dL Final  . Triglycerides 02/19/2013 181* <150 mg/dL Final  . HDL 02/19/2013 34* >39 mg/dL Final  . Total CHOL/HDL Ratio 02/19/2013 5.5   Final  . VLDL 02/19/2013 36  0 - 40 mg/dL Final  . LDL Cholesterol 02/19/2013 117* 0 - 99  mg/dL Final   Comment:                                 Total Cholesterol/HDL:CHD Risk                          Coronary Heart Disease Risk Table                                              Men   Women                           1/2 Average Risk   3.4   3.3                           Average Risk       5.0   4.4                           2 X Average Risk   9.6   7.1                           3 X Average Risk  23.4   11.0  Use the calculated Patient Ratio                          above and the CHD Risk Table                          to determine the patient's CHD Risk.                                                          ATP III CLASSIFICATION (LDL):                           <100     mg/dL   Optimal                           100-129  mg/dL   Near or Above                                             Optimal                           130-159  mg/dL   Borderline                           160-189  mg/dL   High                           >190     mg/dL   Very High  . Sodium, Ur 02/19/2013 78   Final  . Creatinine, Urine 02/19/2013 19.81   Final  . Osmolality, Ur 02/19/2013 249* 390 - 1090 mOsm/kg Final   Performed at [facility]  . Prothrombin Time 02/19/2013 21.9* 11.6 - 15.2 seconds Final  . INR 02/19/2013 1.98* 0.00 - 1.49 Final  . Sodium 02/22/2013 130* 135 - 145 mEq/L Final  . Potassium 02/22/2013 4.0  3.5 - 5.1 mEq/L Final  . Chloride 02/22/2013 97  96 - 112 mEq/L Final  . CO2 02/22/2013 23  19 - 32 mEq/L Final  . Glucose, Bld 02/22/2013 131* 70 - 99 mg/dL Final  . BUN 02/22/2013 14  6 - 23 mg/dL Final  . Creatinine, Ser 02/22/2013 0.74  0.50 - 1.35 mg/dL Final  . Calcium 02/22/2013 8.7  8.4 - 10.5 mg/dL Final  . GFR calc non Af Amer 02/22/2013 79* >90 mL/min Final  . GFR calc Af Amer 02/22/2013 >90  >90 mL/min Final   Comment: (NOTE)                          The eGFR has been calculated using the CKD EPI  equation.                          This calculation has not been validated in all clinical situations.  eGFR's persistently <90 mL/min signify possible Chronic Kidney                          Disease.  Anti-coag visit on 02/15/2013  Component Date Value Range Status  . INR 02/15/2013 1.9   Final  Nursing Home on 01/24/2013  Component Date Value Range Status  . Glucose 01/08/2013 119   Final  . BUN 01/08/2013 15  4 - 21 mg/dL Final  . Creatinine 01/08/2013 1.0  0.6 - 1.3 mg/dL Final  . Potassium 01/08/2013 4.3  3.4 - 5.3 mmol/L Final  . Sodium 01/08/2013 133* 137 - 147 mmol/L Final  . Triglycerides 01/08/2013 142  40 - 160 mg/dL Final  . Cholesterol 01/08/2013 212* 0 - 200 mg/dL Final  . HDL 01/08/2013 43  35 - 70 mg/dL Final  . LDL Cholesterol 01/08/2013 141   Final  . Alkaline Phosphatase 01/08/2013 89  25 - 125 U/L Final  . ALT 01/08/2013 21  10 - 40 U/L Final  . AST 01/08/2013 26  14 - 40 U/L Final  . Bilirubin, Total 01/08/2013 1.0   Final  . Hemoglobin A1C 01/08/2013 6.0  4.0 - 6.0 % Final  . TSH 01/08/2013 3.60  0.41 - 5.90 uIU/mL Final  Anti-coag visit on 01/24/2013  Component Date Value Range Status  . INR 01/24/2013 1.8   Final      Assessment/Plan  1. HTN (hypertension) controlled - CMP; Future  2. Atrial fibrillation chronic  3. Hypothyroidism compensated  4. Type II or unspecified type diabetes mellitus without mention of complication, not stated as uncontrolled controlled  5. Other and unspecified hyperlipidemia controlled  6. CVA (cerebral infarction) recovering  7. Pruritus Added hydroxyzine  8. Memory change Continue to follow. MMse next visit  9. Pruritic condition - hydrOXYzine (ATARAX/VISTARIL) 10 MG tablet; One with meals and at bed to help itching  Dispense: 120 tablet; Refill: 5 - CMP; Future  10. Osteoarthrosis, unspecified whether generalized or localized, lower leg See ortho as needed. Continue  brace.  11. Muscle weakness (generalized) Continue exeercises  12. Unspecified vitamin D deficiency Continue suplement  13. Depression due to stroke He does not want antidepressant yeet  14. 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 Abnormality of gait Continue walker  16. Adjustment disorder with mixed anxiety and depressed mood Consider antidepressant when he desires  33. Xerophthalmia To see ophthalmologist in the next couple of weeks  18. Fatigue Multifactorial. No new orders.   Given the overall deterioration and risk for falls, I have recommended that he move to Assisted Living.

## 2013-04-12 DIAGNOSIS — R1312 Dysphagia, oropharyngeal phase: Secondary | ICD-10-CM | POA: Diagnosis not present

## 2013-04-12 DIAGNOSIS — R488 Other symbolic dysfunctions: Secondary | ICD-10-CM | POA: Diagnosis not present

## 2013-04-17 ENCOUNTER — Other Ambulatory Visit: Payer: Self-pay | Admitting: *Deleted

## 2013-04-17 DIAGNOSIS — Z961 Presence of intraocular lens: Secondary | ICD-10-CM | POA: Diagnosis not present

## 2013-04-17 MED ORDER — HYDROXYZINE HCL 10 MG PO TABS: ORAL_TABLET | ORAL | Status: DC

## 2013-04-18 DIAGNOSIS — R5381 Other malaise: Secondary | ICD-10-CM

## 2013-04-18 DIAGNOSIS — R1312 Dysphagia, oropharyngeal phase: Secondary | ICD-10-CM | POA: Diagnosis not present

## 2013-04-18 DIAGNOSIS — R5383 Other fatigue: Secondary | ICD-10-CM

## 2013-04-18 DIAGNOSIS — R269 Unspecified abnormalities of gait and mobility: Secondary | ICD-10-CM | POA: Insufficient documentation

## 2013-04-18 DIAGNOSIS — I69328 Other speech and language deficits following cerebral infarction: Secondary | ICD-10-CM

## 2013-04-18 DIAGNOSIS — F4323 Adjustment disorder with mixed anxiety and depressed mood: Secondary | ICD-10-CM

## 2013-04-18 DIAGNOSIS — R488 Other symbolic dysfunctions: Secondary | ICD-10-CM | POA: Diagnosis not present

## 2013-04-18 HISTORY — DX: Adjustment disorder with mixed anxiety and depressed mood: F43.23

## 2013-04-18 HISTORY — DX: Unspecified abnormalities of gait and mobility: R26.9

## 2013-04-18 HISTORY — DX: Other malaise: R53.81

## 2013-04-18 HISTORY — DX: Other speech and language deficits following cerebral infarction: I69.328

## 2013-04-18 HISTORY — DX: Other fatigue: R53.83

## 2013-04-18 MED ORDER — HYDROXYZINE HCL 10 MG PO TABS: ORAL_TABLET | ORAL | Status: DC

## 2013-04-22 DIAGNOSIS — R488 Other symbolic dysfunctions: Secondary | ICD-10-CM | POA: Diagnosis not present

## 2013-04-22 DIAGNOSIS — R1312 Dysphagia, oropharyngeal phase: Secondary | ICD-10-CM | POA: Diagnosis not present

## 2013-04-24 ENCOUNTER — Encounter: Payer: Self-pay | Admitting: *Deleted

## 2013-04-24 DIAGNOSIS — R1312 Dysphagia, oropharyngeal phase: Secondary | ICD-10-CM | POA: Diagnosis not present

## 2013-04-24 DIAGNOSIS — R488 Other symbolic dysfunctions: Secondary | ICD-10-CM | POA: Diagnosis not present

## 2013-04-26 ENCOUNTER — Encounter: Payer: Self-pay | Admitting: Cardiovascular Disease

## 2013-04-26 ENCOUNTER — Ambulatory Visit (INDEPENDENT_AMBULATORY_CARE_PROVIDER_SITE_OTHER): Payer: Medicare Other | Admitting: Cardiovascular Disease

## 2013-04-26 VITALS — BP 132/88 | HR 65 | Resp 20 | Ht 68.0 in | Wt 157.0 lb

## 2013-04-26 DIAGNOSIS — I4891 Unspecified atrial fibrillation: Secondary | ICD-10-CM | POA: Diagnosis not present

## 2013-04-26 DIAGNOSIS — I2510 Atherosclerotic heart disease of native coronary artery without angina pectoris: Secondary | ICD-10-CM

## 2013-04-26 LAB — MDC_IDC_ENUM_SESS_TYPE_INCLINIC
Battery Voltage: 2.99 V
Brady Statistic AP VP Percent: 42.35 %
Brady Statistic AS VS Percent: 25.48 %
Date Time Interrogation Session: 20150220173528
Lead Channel Impedance Value: 432 Ohm
Lead Channel Pacing Threshold Pulse Width: 0.4 ms
Lead Channel Sensing Intrinsic Amplitude: 0.572 mV
Lead Channel Sensing Intrinsic Amplitude: 11.511 mV
Lead Channel Setting Pacing Amplitude: 2 V
Lead Channel Setting Pacing Amplitude: 2.5 V
Lead Channel Setting Pacing Pulse Width: 0.4 ms
Lead Channel Setting Sensing Sensitivity: 0.9 mV
MDC IDC MSMT LEADCHNL RA IMPEDANCE VALUE: 480 Ohm
MDC IDC MSMT LEADCHNL RV PACING THRESHOLD AMPLITUDE: 1 V
MDC IDC STAT BRADY AP VS PERCENT: 0.22 %
MDC IDC STAT BRADY AS VP PERCENT: 31.94 %
MDC IDC STAT BRADY RA PERCENT PACED: 42.58 %
MDC IDC STAT BRADY RV PERCENT PACED: 74.3 %
Zone Setting Detection Interval: 350 ms
Zone Setting Detection Interval: 400 ms

## 2013-04-26 LAB — PACEMAKER DEVICE OBSERVATION

## 2013-04-26 NOTE — Patient Instructions (Addendum)
You will be notified early March about going into the hospital probably 05/13/13 for the starting of a new drug Tikosyn.  Your physician wants you to follow-up in: 12 months with Dr. Sallyanne Kuster.  You will receive a reminder letter in the mail two months in advance. If you don't receive a letter, please call our office to schedule the follow-up appointment.  Remote monitoring is used to monitor your Pacemaker of ICD from home. This monitoring reduces the number of office visits required to check your device to one time per year. It allows [name] to keep an eye on the functioning of your device to ensure it is working properly. You are scheduled for a device check from home on Jul 26, 2013. You may send your transmission at any time that day. If you have a wireless device, the transmission will be sent automatically. After your physician reviews your transmission, you will receive a postcard with your next transmission date.

## 2013-04-27 ENCOUNTER — Encounter: Payer: Self-pay | Admitting: Cardiovascular Disease

## 2013-04-27 NOTE — Progress Notes (Signed)
Patient ID: Jon [name], male   DOB: 12/21/1921, 80 y.o.   MRN: [id number]     Reason for office visit Pacemaker check, atrial fibrillation  Jon [name] is an 80 year old patient of Dr. Debara Pickett who has a long-standing history of sinus node dysfunction and paroxysmal atrial fibrillation. His pacemaker is a [employer] device implanted in October 2011. It is an MRI conditional device. Does have a history of coronary disease and underwent bypass surgery 1989 and placement of stents in the saphenous vein graft to the oblique marginal artery in 2008. He has mild ischemic cardiomyopathy with a left ventricular ejection fraction of roughly 40-45%. His left atrium is moderately dilated.  He suffered an right thalamic ischemic stroke on December 15, manifesting as slurred speech and weakness in his right leg. He has very mild residual facial asymmetry and slurring of speech but has recovered well.  At the time of presentation with stroke he was warfarin anticoagulated with a therapeutic INR. Warfarin was stopped and he was discharged from the hospital on Eliquis, which he is tolerating well.    Interrogation of his pacemaker shows a roughly 24-hour episode of atrial fibrillation that occurred on December 9-10, a few days before his stroke. He did not have atrial fibrillation throughout his hospitalization but went back into atrial fibrillation on December 20. With the exception of a couple of days in late January he has been in persistent atrial fibrillation ever since. Prior to the vas couple of months he had infrequent episodes of paroxysmal atrial fibrillation, generally lasting less than 24 hours.. Ventricular pacing occurs roughly 80% of the time with clear increased frequency of ventricular pacing while in atrial fibrillation.  There has been a marked reduction in his energy level and activity since the stroke. This overlaps with the presence of atrial fibrillation.    Allergies    Allergen Reactions  . Caduet [Amlodipine-Atorvastatin]     Weakness   . Crestor [Rosuvastatin]   . Cymbalta [Duloxetine Hcl] Other (See Comments)    Excessive sedation  . Lipitor [Atorvastatin Calcium]     Muscle pain  . Lisinopril   . Simvastatin     Muscle pain    Current Outpatient Prescriptions  Medication Sig Dispense Refill  . allopurinol (ZYLOPRIM) 100 MG tablet Take 50 mg by mouth every morning.      Marland Kitchen amoxicillin (AMOXIL) 500 MG tablet Take 500 mg by mouth. Take four tablets one hour prior to dental procedures.      Marland Kitchen apixaban (ELIQUIS) 2.5 MG TABS tablet Take 1 tablet (2.5 mg total) by mouth 2 (two) times daily.  62 tablet  5  . aspirin EC 81 MG tablet Take 81 mg by mouth daily.        . Cholecalciferol (VITAMIN D) 400 UNITS capsule Take 400 Units by mouth 2 (two) times daily.       . furosemide (LASIX) 20 MG tablet Take 1 tablet (20 mg total) by mouth daily.  90 tablet  3  . hydrOXYzine (ATARAX/VISTARIL) 10 MG tablet One with meals and at bed to help itching  120 tablet  5  . ipratropium (ATROVENT) 0.03 % nasal spray Place 2 sprays into both nostrils 3 (three) times daily.      Marland Kitchen LORazepam (ATIVAN) 0.5 MG tablet Take 1 tablet (0.5 mg total) by mouth at bedtime as needed for anxiety or sleep.  15 tablet  0  . losartan (COZAAR) 100 MG tablet Take 1 tablet (100 mg total)  by mouth daily.  30 tablet  5  . metoprolol (LOPRESSOR) 100 MG tablet Take 0.5 tablets (50 mg total) by mouth 2 (two) times daily.      . nitroGLYCERIN (NITROSTAT) 0.4 MG SL tablet Place 0.4 mg under the tongue every 5 (five) minutes as needed. For chest pain       . polyvinyl alcohol (LIQUIFILM TEARS) 1.4 % ophthalmic solution Place 1 drop into both eyes 4 (four) times daily as needed for dry eyes.      . potassium chloride (MICRO-K) 10 MEQ CR capsule Take 10 mEq by mouth 2 (two) times daily.      Marland Kitchen zolpidem (AMBIEN) 10 MG tablet Take 2.5-5 mg by mouth at bedtime as needed for sleep.      Marland Kitchen levothyroxine  (SYNTHROID, LEVOTHROID) 100 MCG tablet Take 100 mcg by mouth daily before breakfast.       No current facility-administered medications for this visit.    Past Medical History  Diagnosis Date  . Hypertension   . Coronary artery disease   . Arthritis   . Cataract   . Anxiety   . Blood transfusion without reported diagnosis   . Hyperlipidemia   . Heart murmur   . Allergy   . Thyroid disease   . PAF (paroxysmal atrial fibrillation)     coumadin  . History of abdominal aortic aneurysm   . History of echocardiogram 12/2008    EF >55%; mild concentric LVH; mild MR; mild-mod TR; mild AV regurg;   . History of nuclear stress test 12/2010    lexiscan; low risk; compared to prior study, perfusion improved  . History of Doppler ultrasound 05/22/2012    LEAs; R anterior tibial artery appeared occluded; L posterior tibial shows short segment of occlusive ds  . History of Doppler ultrasound 05/22/2012    Abdominal Aortic Doppler; slight increase in fusiform aneurysm     Past Surgical History  Procedure Laterality Date  . Coronary artery bypass graft  1989  . Pacemaker insertion  2011  . Insert / replace / remove pacemaker    . Eye surgery    . Joint replacement    . Hernia repair    . Coronary angioplasty with stent placement  2008    stent to SVG to OM    Family History  Problem Relation Age of Onset  . Diabetes type II Father   . Other Mother     PAD with amputations    History   Social History  . Marital Status: Married    Spouse Name: [name]    Number of Children: 3  . Years of Education: [name]   Occupational History  . Retired Armed forces operational officer    Social History Main Topics  . Smoking status: Former Smoker -- 0.75 packs/day for 15 years    Types: Cigarettes    Quit date: 03/04/1967  . Smokeless tobacco: Never Used  . Alcohol Use: No  . Drug Use: No  . Sexual Activity: No   Other Topics Concern  . Not on file   Social History Narrative  . No narrative on file     Review of systems: Mild residual slurred speech and facial asymmetry. He complains of lack of energy, lethargy, denies dyspnea or chest pain. The patient specifically denies orthopnea, paroxysmal nocturnal dyspnea, syncope, palpitations, new focal neurological deficits after the stroke in December, intermittent claudication, lower extremity edema, unexplained weight gain, cough, hemoptysis or wheezing.  The patient also denies abdominal pain,  nausea, vomiting, dysphagia, diarrhea, constipation, polyuria, polydipsia, dysuria, hematuria, frequency, urgency, abnormal bleeding or bruising, fever, chills, unexpected weight changes, mood swings, change in skin or hair texture, change in voice quality, auditory or visual problems, allergic reactions or rashes, new musculoskeletal complaints other than usual "aches and pains".   PHYSICAL EXAM BP 132/88  Pulse 65  Resp 20  Ht 5\' 8"  (1.727 m)  Wt 71.215 kg (157 lb)  BMI 23.88 kg/m2  General: Alert, oriented x3, no distress Head: no evidence of trauma, PERRL, EOMI, no exophtalmos or lid lag, no myxedema, no xanthelasma; normal ears, nose and oropharynx Neck: normal jugular venous pulsations and no hepatojugular reflux; brisk carotid pulses without delay and no carotid bruits Chest: clear to auscultation, no signs of consolidation by percussion or palpation, normal fremitus, symmetrical and full respiratory excursions Cardiovascular: normal position and quality of the apical impulse, regular rhythm, normal first and second heart sounds, no murmurs, rubs or gallops Abdomen: no tenderness or distention, no masses by palpation, no abnormal pulsatility or arterial bruits, normal bowel sounds, no hepatosplenomegaly Extremities: no clubbing, cyanosis or edema; 2+ radial, ulnar and brachial pulses bilaterally; 2+ right femoral, posterior tibial and dorsalis pedis pulses; 2+ left femoral, posterior tibial and dorsalis pedis pulses; no subclavian or femoral  bruits Neurological: Mild dysarthria, subtle facial droop on the left   EKG: Atrial fibrillation with intermittent ventricular pacing in a couple of fusion beats; right bundle branch block with a very tall R waves in leads V1 and V2, T-wave inversion throughout the anterior precordium   Lipid Panel     Component Value Date/Time   CHOL 89 03/26/2013   TRIG 54 03/26/2013   HDL 31* 03/26/2013   CHOLHDL 5.5 02/19/2013 0355   VLDL 36 02/19/2013 0355   LDLCALC 47 03/26/2013    BMET    Component Value Date/Time   NA 134* 03/27/2013 1636   NA 137 03/26/2013   K 4.7 03/27/2013 1636   CL 99 03/27/2013 1636   CO2 25 03/27/2013 1636   GLUCOSE 107* 03/27/2013 1636   BUN 25* 03/27/2013 1636   BUN 23* 03/26/2013   CREATININE 1.18 03/27/2013 1636   CREATININE 1.3 03/26/2013   CREATININE 0.74 02/22/2013 0943   CALCIUM 9.7 03/27/2013 1636   GFRNONAA 79* 02/22/2013 0943   GFRAA >90 02/22/2013 0943     ASSESSMENT AND PLAN  Pacemaker function is normal. No permanent changes are made to device settings today. Notes that this is an MRI condition with advice that does allow MRI scans of the brain with certain precautions. During his hospitalization it was felt that an MRI was contraindicated due to the presence of his pacemaker. He can have most MRI scans  Symptomatic atrial fibrillation. His activity level has gone down since his stroke but he has mostly recovered his neurological deficits. He is lethargic. I am not sure whether this can be blamed entirely on the atrial fibrillation, but this is likely. It is also likely that the increased frequency of ventricular pacing, associated with the atrial fibrillation, is contributing to his deteriorating functional status.  He would benefit from return to normal sinus rhythms or atrial paced rhythm. Since his atrial fibrillation has become quite persistent, it may be wise to start antiarrhythmic therapy before the cardioversion. He is therapeutically anticoagulated  with Eliquis and has been very compliant with his medication. Discussed options with Dr. Debara Pickett who will coordinate hospitalization for antiarrhythmic therapy and cardioversion.   Orders Placed This Encounter  Procedures  .  Implantable device check  . EKG 12-Lead   No orders of the defined types were placed in this encounter.    Holli Humbles, MD, [facility] [phone] office [phone] pager

## 2013-04-29 DIAGNOSIS — R1312 Dysphagia, oropharyngeal phase: Secondary | ICD-10-CM | POA: Diagnosis not present

## 2013-04-29 DIAGNOSIS — R488 Other symbolic dysfunctions: Secondary | ICD-10-CM | POA: Diagnosis not present

## 2013-04-30 ENCOUNTER — Telehealth: Payer: Self-pay | Admitting: Internal Medicine

## 2013-04-30 ENCOUNTER — Inpatient Hospital Stay (HOSPITAL_COMMUNITY): Payer: Medicare Other

## 2013-04-30 ENCOUNTER — Emergency Department (HOSPITAL_COMMUNITY): Payer: Medicare Other

## 2013-04-30 ENCOUNTER — Inpatient Hospital Stay (HOSPITAL_COMMUNITY)
Admission: EM | Admit: 2013-04-30 | Discharge: 2013-05-03 | DRG: 066 | Disposition: A | Discharge status: Skilled Nursing Facility | Payer: Medicare Other | Attending: Internal Medicine | Admitting: Internal Medicine

## 2013-04-30 ENCOUNTER — Encounter (HOSPITAL_COMMUNITY): Payer: Self-pay | Admitting: Emergency Medicine

## 2013-04-30 DIAGNOSIS — I714 Abdominal aortic aneurysm, without rupture, unspecified: Secondary | ICD-10-CM | POA: Diagnosis present

## 2013-04-30 DIAGNOSIS — J9819 Other pulmonary collapse: Secondary | ICD-10-CM | POA: Diagnosis not present

## 2013-04-30 DIAGNOSIS — I10 Essential (primary) hypertension: Secondary | ICD-10-CM | POA: Diagnosis not present

## 2013-04-30 DIAGNOSIS — Z951 Presence of aortocoronary bypass graft: Secondary | ICD-10-CM

## 2013-04-30 DIAGNOSIS — M25519 Pain in unspecified shoulder: Secondary | ICD-10-CM

## 2013-04-30 DIAGNOSIS — Z833 Family history of diabetes mellitus: Secondary | ICD-10-CM

## 2013-04-30 DIAGNOSIS — R942 Abnormal results of pulmonary function studies: Secondary | ICD-10-CM | POA: Diagnosis not present

## 2013-04-30 DIAGNOSIS — I5023 Acute on chronic systolic (congestive) heart failure: Secondary | ICD-10-CM

## 2013-04-30 DIAGNOSIS — F329 Major depressive disorder, single episode, unspecified: Secondary | ICD-10-CM

## 2013-04-30 DIAGNOSIS — Z950 Presence of cardiac pacemaker: Secondary | ICD-10-CM | POA: Diagnosis present

## 2013-04-30 DIAGNOSIS — G4733 Obstructive sleep apnea (adult) (pediatric): Secondary | ICD-10-CM

## 2013-04-30 DIAGNOSIS — I498 Other specified cardiac arrhythmias: Secondary | ICD-10-CM

## 2013-04-30 DIAGNOSIS — R55 Syncope and collapse: Secondary | ICD-10-CM

## 2013-04-30 DIAGNOSIS — R269 Unspecified abnormalities of gait and mobility: Secondary | ICD-10-CM

## 2013-04-30 DIAGNOSIS — Z79899 Other long term (current) drug therapy: Secondary | ICD-10-CM

## 2013-04-30 DIAGNOSIS — Z7901 Long term (current) use of anticoagulants: Secondary | ICD-10-CM | POA: Diagnosis not present

## 2013-04-30 DIAGNOSIS — I6350 Cerebral infarction due to unspecified occlusion or stenosis of unspecified cerebral artery: Principal | ICD-10-CM | POA: Diagnosis present

## 2013-04-30 DIAGNOSIS — E119 Type 2 diabetes mellitus without complications: Secondary | ICD-10-CM

## 2013-04-30 DIAGNOSIS — Z87891 Personal history of nicotine dependence: Secondary | ICD-10-CM | POA: Diagnosis not present

## 2013-04-30 DIAGNOSIS — M6281 Muscle weakness (generalized): Secondary | ICD-10-CM

## 2013-04-30 DIAGNOSIS — I719 Aortic aneurysm of unspecified site, without rupture: Secondary | ICD-10-CM

## 2013-04-30 DIAGNOSIS — E871 Hypo-osmolality and hyponatremia: Secondary | ICD-10-CM

## 2013-04-30 DIAGNOSIS — Z888 Allergy status to other drugs, medicaments and biological substances status: Secondary | ICD-10-CM

## 2013-04-30 DIAGNOSIS — L299 Pruritus, unspecified: Secondary | ICD-10-CM

## 2013-04-30 DIAGNOSIS — R413 Other amnesia: Secondary | ICD-10-CM

## 2013-04-30 DIAGNOSIS — N401 Enlarged prostate with lower urinary tract symptoms: Secondary | ICD-10-CM

## 2013-04-30 DIAGNOSIS — N138 Other obstructive and reflux uropathy: Secondary | ICD-10-CM

## 2013-04-30 DIAGNOSIS — F4323 Adjustment disorder with mixed anxiety and depressed mood: Secondary | ICD-10-CM

## 2013-04-30 DIAGNOSIS — G459 Transient cerebral ischemic attack, unspecified: Secondary | ICD-10-CM | POA: Diagnosis not present

## 2013-04-30 DIAGNOSIS — I701 Atherosclerosis of renal artery: Secondary | ICD-10-CM

## 2013-04-30 DIAGNOSIS — Z8673 Personal history of transient ischemic attack (TIA), and cerebral infarction without residual deficits: Secondary | ICD-10-CM | POA: Insufficient documentation

## 2013-04-30 DIAGNOSIS — I4891 Unspecified atrial fibrillation: Secondary | ICD-10-CM | POA: Diagnosis present

## 2013-04-30 DIAGNOSIS — I639 Cerebral infarction, unspecified: Secondary | ICD-10-CM | POA: Diagnosis present

## 2013-04-30 DIAGNOSIS — I69992 Facial weakness following unspecified cerebrovascular disease: Secondary | ICD-10-CM | POA: Diagnosis not present

## 2013-04-30 DIAGNOSIS — I4510 Unspecified right bundle-branch block: Secondary | ICD-10-CM | POA: Diagnosis present

## 2013-04-30 DIAGNOSIS — I69922 Dysarthria following unspecified cerebrovascular disease: Secondary | ICD-10-CM

## 2013-04-30 DIAGNOSIS — I509 Heart failure, unspecified: Secondary | ICD-10-CM | POA: Diagnosis not present

## 2013-04-30 DIAGNOSIS — M25569 Pain in unspecified knee: Secondary | ICD-10-CM

## 2013-04-30 DIAGNOSIS — K648 Other hemorrhoids: Secondary | ICD-10-CM

## 2013-04-30 DIAGNOSIS — C44319 Basal cell carcinoma of skin of other parts of face: Secondary | ICD-10-CM

## 2013-04-30 DIAGNOSIS — M1710 Unilateral primary osteoarthritis, unspecified knee: Secondary | ICD-10-CM

## 2013-04-30 DIAGNOSIS — W19XXXA Unspecified fall, initial encounter: Secondary | ICD-10-CM

## 2013-04-30 DIAGNOSIS — I2510 Atherosclerotic heart disease of native coronary artery without angina pectoris: Secondary | ICD-10-CM | POA: Diagnosis present

## 2013-04-30 DIAGNOSIS — F411 Generalized anxiety disorder: Secondary | ICD-10-CM | POA: Diagnosis present

## 2013-04-30 DIAGNOSIS — N529 Male erectile dysfunction, unspecified: Secondary | ICD-10-CM

## 2013-04-30 DIAGNOSIS — R0602 Shortness of breath: Secondary | ICD-10-CM | POA: Diagnosis not present

## 2013-04-30 DIAGNOSIS — E507 Other ocular manifestations of vitamin A deficiency: Secondary | ICD-10-CM

## 2013-04-30 DIAGNOSIS — R350 Frequency of micturition: Secondary | ICD-10-CM

## 2013-04-30 DIAGNOSIS — R351 Nocturia: Secondary | ICD-10-CM

## 2013-04-30 DIAGNOSIS — IMO0002 Reserved for concepts with insufficient information to code with codable children: Secondary | ICD-10-CM

## 2013-04-30 DIAGNOSIS — I672 Cerebral atherosclerosis: Secondary | ICD-10-CM | POA: Diagnosis present

## 2013-04-30 DIAGNOSIS — E785 Hyperlipidemia, unspecified: Secondary | ICD-10-CM | POA: Diagnosis present

## 2013-04-30 DIAGNOSIS — J329 Chronic sinusitis, unspecified: Secondary | ICD-10-CM

## 2013-04-30 DIAGNOSIS — M4802 Spinal stenosis, cervical region: Secondary | ICD-10-CM

## 2013-04-30 DIAGNOSIS — I69328 Other speech and language deficits following cerebral infarction: Secondary | ICD-10-CM

## 2013-04-30 DIAGNOSIS — Z9861 Coronary angioplasty status: Secondary | ICD-10-CM | POA: Diagnosis not present

## 2013-04-30 DIAGNOSIS — E039 Hypothyroidism, unspecified: Secondary | ICD-10-CM

## 2013-04-30 DIAGNOSIS — R5383 Other fatigue: Secondary | ICD-10-CM

## 2013-04-30 DIAGNOSIS — J309 Allergic rhinitis, unspecified: Secondary | ICD-10-CM

## 2013-04-30 DIAGNOSIS — R29898 Other symptoms and signs involving the musculoskeletal system: Secondary | ICD-10-CM | POA: Diagnosis not present

## 2013-04-30 DIAGNOSIS — M109 Gout, unspecified: Secondary | ICD-10-CM

## 2013-04-30 DIAGNOSIS — M1990 Unspecified osteoarthritis, unspecified site: Secondary | ICD-10-CM

## 2013-04-30 DIAGNOSIS — E559 Vitamin D deficiency, unspecified: Secondary | ICD-10-CM

## 2013-04-30 DIAGNOSIS — R5381 Other malaise: Secondary | ICD-10-CM

## 2013-04-30 DIAGNOSIS — E1159 Type 2 diabetes mellitus with other circulatory complications: Secondary | ICD-10-CM | POA: Diagnosis present

## 2013-04-30 HISTORY — DX: Cerebral infarction, unspecified: I63.9

## 2013-04-30 HISTORY — DX: Transient cerebral ischemic attack, unspecified: G45.9

## 2013-04-30 LAB — URINALYSIS, ROUTINE W REFLEX MICROSCOPIC
BILIRUBIN URINE: NEGATIVE
Glucose, UA: NEGATIVE mg/dL
Hgb urine dipstick: NEGATIVE
KETONES UR: NEGATIVE mg/dL
Leukocytes, UA: NEGATIVE
NITRITE: NEGATIVE
Protein, ur: NEGATIVE mg/dL
Specific Gravity, Urine: 1.013 (ref 1.005–1.030)
Urobilinogen, UA: 0.2 mg/dL (ref 0.0–1.0)
pH: 6.5 (ref 5.0–8.0)

## 2013-04-30 LAB — DIFFERENTIAL
Basophils Absolute: 0 10*3/uL (ref 0.0–0.1)
Basophils Relative: 0 % (ref 0–1)
Eosinophils Absolute: 0.1 10*3/uL (ref 0.0–0.7)
Eosinophils Relative: 1 % (ref 0–5)
LYMPHS ABS: 1.4 10*3/uL (ref 0.7–4.0)
LYMPHS PCT: 20 % (ref 12–46)
MONOS PCT: 13 % — AB (ref 3–12)
Monocytes Absolute: 0.9 10*3/uL (ref 0.1–1.0)
NEUTROS ABS: 4.5 10*3/uL (ref 1.7–7.7)
NEUTROS PCT: 65 % (ref 43–77)

## 2013-04-30 LAB — COMPREHENSIVE METABOLIC PANEL
ALK PHOS: 81 U/L (ref 39–117)
ALT: 16 U/L (ref 0–53)
AST: 21 U/L (ref 0–37)
Albumin: 3.3 g/dL — ABNORMAL LOW (ref 3.5–5.2)
BILIRUBIN TOTAL: 0.7 mg/dL (ref 0.3–1.2)
BUN: 28 mg/dL — AB (ref 6–23)
CHLORIDE: 104 meq/L (ref 96–112)
CO2: 24 meq/L (ref 19–32)
Calcium: 9 mg/dL (ref 8.4–10.5)
Creatinine, Ser: 1.11 mg/dL (ref 0.50–1.35)
GFR calc non Af Amer: 57 mL/min — ABNORMAL LOW (ref >90)
GFR, EST AFRICAN AMERICAN: 66 mL/min — AB (ref >90)
GLUCOSE: 111 mg/dL — AB (ref 70–99)
Potassium: 4.2 mEq/L (ref 3.7–5.3)
SODIUM: 141 meq/L (ref 137–147)
Total Protein: 6.4 g/dL (ref 6.0–8.3)

## 2013-04-30 LAB — APTT: APTT: 37 s (ref 24–37)

## 2013-04-30 LAB — CBC
HCT: 44.6 % (ref 39.0–52.0)
HEMOGLOBIN: 15.7 g/dL (ref 13.0–17.0)
MCH: 32.7 pg (ref 26.0–34.0)
MCHC: 35.2 g/dL (ref 30.0–36.0)
MCV: 92.9 fL (ref 78.0–100.0)
Platelets: 207 10*3/uL (ref 150–400)
RBC: 4.8 MIL/uL (ref 4.22–5.81)
RDW: 13.4 % (ref 11.5–15.5)
WBC: 6.9 10*3/uL (ref 4.0–10.5)

## 2013-04-30 LAB — RAPID URINE DRUG SCREEN, HOSP PERFORMED
AMPHETAMINES: NOT DETECTED
Barbiturates: NOT DETECTED
Benzodiazepines: NOT DETECTED
COCAINE: NOT DETECTED
Opiates: NOT DETECTED
TETRAHYDROCANNABINOL: NOT DETECTED

## 2013-04-30 LAB — PROTIME-INR
INR: 1.44 (ref 0.00–1.49)
Prothrombin Time: 17.2 seconds — ABNORMAL HIGH (ref 11.6–15.2)

## 2013-04-30 LAB — ETHANOL: Alcohol, Ethyl (B): <11 mg/dL (ref 0–11)

## 2013-04-30 LAB — TROPONIN I: Troponin I: <0.3 ng/mL (ref <0.30)

## 2013-04-30 LAB — MAGNESIUM: Magnesium: 2.2 mg/dL (ref 1.5–2.5)

## 2013-04-30 MED ORDER — LOSARTAN POTASSIUM 50 MG PO TABS
100.0000 mg | ORAL_TABLET | Freq: Every day | ORAL | Status: DC
  Administered 2013-05-01 – 2013-05-03 (×3): 100 mg via ORAL
  Filled 2013-04-30 (×3): qty 2

## 2013-04-30 MED ORDER — POLYVINYL ALCOHOL 1.4 % OP SOLN
1.0000 [drp] | Freq: Four times a day (QID) | OPHTHALMIC | Status: DC | PRN
  Administered 2013-05-01: 1 [drp] via OPHTHALMIC
  Filled 2013-04-30: qty 15

## 2013-04-30 MED ORDER — FUROSEMIDE 20 MG PO TABS
20.0000 mg | ORAL_TABLET | Freq: Every day | ORAL | Status: DC
  Administered 2013-05-01 – 2013-05-03 (×3): 20 mg via ORAL
  Filled 2013-04-30 (×3): qty 1

## 2013-04-30 MED ORDER — HYDROXYZINE HCL 10 MG PO TABS
10.0000 mg | ORAL_TABLET | Freq: Three times a day (TID) | ORAL | Status: DC
  Administered 2013-04-30 – 2013-05-03 (×9): 10 mg via ORAL
  Filled 2013-04-30 (×14): qty 1

## 2013-04-30 MED ORDER — ASPIRIN EC 81 MG PO TBEC
81.0000 mg | DELAYED_RELEASE_TABLET | Freq: Every day | ORAL | Status: DC
  Administered 2013-05-01: 81 mg via ORAL
  Filled 2013-04-30: qty 1

## 2013-04-30 MED ORDER — IPRATROPIUM BROMIDE 0.03 % NA SOLN
1.0000 | Freq: Three times a day (TID) | NASAL | Status: DC | PRN
  Filled 2013-04-30: qty 30

## 2013-04-30 MED ORDER — METOPROLOL TARTRATE 50 MG PO TABS
50.0000 mg | ORAL_TABLET | Freq: Two times a day (BID) | ORAL | Status: DC
  Administered 2013-04-30 – 2013-05-02 (×4): 50 mg via ORAL
  Filled 2013-04-30 (×5): qty 1

## 2013-04-30 MED ORDER — APIXABAN 5 MG PO TABS
5.0000 mg | ORAL_TABLET | Freq: Two times a day (BID) | ORAL | Status: DC
  Administered 2013-04-30 – 2013-05-03 (×6): 5 mg via ORAL
  Filled 2013-04-30 (×7): qty 1

## 2013-04-30 MED ORDER — LEVOTHYROXINE SODIUM 100 MCG PO TABS
100.0000 ug | ORAL_TABLET | Freq: Every day | ORAL | Status: DC
  Administered 2013-05-01 – 2013-05-03 (×3): 100 ug via ORAL
  Filled 2013-04-30 (×4): qty 1

## 2013-04-30 MED ORDER — ALLOPURINOL 100 MG PO TABS
50.0000 mg | ORAL_TABLET | Freq: Every day | ORAL | Status: DC
  Administered 2013-05-01 – 2013-05-03 (×3): 50 mg via ORAL
  Filled 2013-04-30 (×3): qty 0.5

## 2013-04-30 MED ORDER — ZOLPIDEM TARTRATE 5 MG PO TABS: 2.5000 mg | ORAL_TABLET | Freq: Every evening | ORAL | Status: DC | PRN

## 2013-04-30 NOTE — H&P (Signed)
[facility] History and Physical  Jon [name] EGB:[id number] DOB: 1921/05/13 DOA: 04/30/2013  Referring physician: Dr. Karle [name] PCP: Jon Dooms, MD   Chief Complaint: Right leg weakness Presented from home, he has plans to move to ALF on this coming Friday  HPI: Jon [name] is a 80 y.o. male has a list of atrial fibrillation and previous stroke in to the hospital because of right lower leg weakness. Patient woke up this morning about 7 he went to the bathroom about 7:30 he developed right lower leg weakness which lasted for about 30 minutes. He denies any fainting, syncope, chest pain or shortness of breath. Denies any headache. Patient has atrial fibrillation which was being well controlled with amiodarone, he was taken off of a meal in August of 2014 secondary to abnormal PFTs. Cardiology consider starting Tikosyn. In the ED CT scan of the head was done and showed subacute to chronic lacunar infarction of the L MCA distribution patient admitted to the hospital for further evaluation.   Review of Systems:  Constitutional: Generalized weakness Eyes: negative for irritation, redness and visual disturbance Ears, nose, mouth, throat, and face: negative for earaches, epistaxis, nasal congestion and sore throat Respiratory: negative for cough, dyspnea on exertion, sputum and wheezing Cardiovascular: negative for chest pain, dyspnea, lower extremity edema, orthopnea, palpitations and syncope Gastrointestinal: negative for abdominal pain, constipation, diarrhea, melena, nausea and vomiting Genitourinary:negative for dysuria, frequency and hematuria Hematologic/lymphatic: negative for bleeding, easy bruising and lymphadenopathy Musculoskeletal:negative for arthralgias, muscle weakness and stiff joints Neurological: Right lower extremity weakness. Endocrine: negative for diabetic symptoms including polydipsia, polyuria and weight loss Allergic/Immunologic: negative for  anaphylaxis, hay fever and urticaria   Past Medical History  Diagnosis Date  . Hypertension   . Coronary artery disease   . Arthritis   . Cataract   . Anxiety   . Blood transfusion without reported diagnosis   . Hyperlipidemia   . Heart murmur   . Allergy   . Thyroid disease   . PAF (paroxysmal atrial fibrillation)     coumadin  . History of abdominal aortic aneurysm   . History of echocardiogram 12/2008    EF >55%; mild concentric LVH; mild MR; mild-mod TR; mild AV regurg;   . History of nuclear stress test 12/2010    lexiscan; low risk; compared to prior study, perfusion improved  . History of Doppler ultrasound 05/22/2012    LEAs; R anterior tibial artery appeared occluded; L posterior tibial shows short segment of occlusive ds  . History of Doppler ultrasound 05/22/2012    Abdominal Aortic Doppler; slight increase in fusiform aneurysm   . Stroke    Past Surgical History  Procedure Laterality Date  . Coronary artery bypass graft  1989  . Pacemaker insertion  2011  . Insert / replace / remove pacemaker    . Eye surgery    . Joint replacement    . Hernia repair    . Coronary angioplasty with stent placement  2008    stent to SVG to OM   Social History:  reports that he quit smoking about 46 years ago. His smoking use included Cigarettes. He has a 11.25 pack-year smoking history. He has never used smokeless tobacco. He reports that he does not drink alcohol or use illicit drugs.  Allergies  Allergen Reactions  . Caduet [Amlodipine-Atorvastatin] Other (See Comments)    Weakness   . Crestor [Rosuvastatin] Other (See Comments)    Muscle pain/weakness  . Cymbalta [Duloxetine Hcl] Other (  See Comments)    Excessive sedation  . Lipitor [Atorvastatin Calcium] Other (See Comments)    Muscle pain/weakness  . Lisinopril Other (See Comments)    Doesn't remember reaction  . Simvastatin Other (See Comments)    Muscle pain/weakness    Family History  Problem Relation Age of  Onset  . Diabetes Father   . Other Mother     PAD with amputations     Prior to Admission medications   Medication Sig Start Date End Date Taking? Authorizing Provider  allopurinol (ZYLOPRIM) 100 MG tablet Take 50 mg by mouth daily.    Yes Historical Provider, MD  amoxicillin (AMOXIL) 500 MG tablet Take 1,000 mg by mouth See admin instructions. Take four tablets one hour prior to dental procedures.   Yes Historical Provider, MD  apixaban (ELIQUIS) 2.5 MG TABS tablet Take 1 tablet (2.5 mg total) by mouth 2 (two) times daily. 03/26/13  Yes Jon Bubba Camp, MD  aspirin EC 81 MG tablet Take 81 mg by mouth daily.     Yes Historical Provider, MD  Cholecalciferol (VITAMIN D) 400 UNITS capsule Take 400 Units by mouth 2 (two) times daily.    Yes Historical Provider, MD  Emollient (CERAVE EX) Apply 1 application topically daily as needed (itching/ dry skin).   Yes Historical Provider, MD  furosemide (LASIX) 20 MG tablet Take 1 tablet (20 mg total) by mouth daily. 03/20/13  Yes Jon Casino, MD  hydrOXYzine (ATARAX/VISTARIL) 10 MG tablet Take 10 mg by mouth 4 (four) times daily -  with meals and at bedtime.   Yes Historical Provider, MD  ipratropium (ATROVENT) 0.03 % nasal spray Place 1 spray into both nostrils 3 (three) times daily as needed (post nasal drip).    Yes Historical Provider, MD  levothyroxine (SYNTHROID, LEVOTHROID) 100 MCG tablet Take 100 mcg by mouth daily before breakfast.   Yes Historical Provider, MD  LORazepam (ATIVAN) 0.5 MG tablet Take 0.5 mg by mouth at bedtime.   Yes Historical Provider, MD  losartan (COZAAR) 100 MG tablet Take 1 tablet (100 mg total) by mouth daily. 03/26/13  Yes Jon Bubba Camp, MD  metoprolol (LOPRESSOR) 100 MG tablet Take 0.5 tablets (50 mg total) by mouth 2 (two) times daily. 03/27/13  Yes Jon K Kilroy, PA-C  nitroGLYCERIN (NITROSTAT) 0.4 MG SL tablet Place 0.4 mg under the tongue every 5 (five) minutes as needed for chest pain.    Yes Historical Provider, MD    polyvinyl alcohol (LIQUIFILM TEARS) 1.4 % ophthalmic solution Place 1 drop into both eyes 4 (four) times daily as needed for dry eyes.   Yes Historical Provider, MD  potassium chloride (MICRO-K) 10 MEQ CR capsule Take 10 mEq by mouth 2 (two) times daily.   Yes Historical Provider, MD  Skin Protectants, Misc. (EUCERIN) cream Apply 1 application topically daily as needed for dry skin (itching).   Yes Historical Provider, MD  zolpidem (AMBIEN) 10 MG tablet Take 2.5-5 mg by mouth at bedtime as needed for sleep.   Yes Historical Provider, MD   Physical Exam: Filed Vitals:   04/30/13 1615  BP: 142/84  Pulse: 64  Temp:   Resp: 21    BP 142/84  Pulse 64  Temp(Src) 97.7 F (36.5 C) (Oral)  Resp 21  SpO2 94%  General:  Appears calm and comfortable Eyes: PERRL, normal lids, irises & conjunctiva ENT: grossly normal hearing, lips & tongue Neck: no LAD, masses or thyromegaly Cardiovascular: RRR, no m/r/g. No LE edema. Telemetry: SR,  no arrhythmias  Respiratory: CTA bilaterally, no w/r/r. Normal respiratory effort. Abdomen: soft, ntnd Skin: no rash or induration seen on limited exam Musculoskeletal: grossly normal tone BUE/BLE Psychiatric: grossly normal mood and affect, speech fluent and appropriate Neurologic: grossly non-focal.          Labs on Admission:  Basic Metabolic Panel:  Recent Labs Lab 04/30/13 1424  NA 141  K 4.2  CL 104  CO2 24  GLUCOSE 111*  BUN 28*  CREATININE 1.11  CALCIUM 9.0   Liver Function Tests:  Recent Labs Lab 04/30/13 1424  AST 21  ALT 16  ALKPHOS 81  BILITOT 0.7  PROT 6.4  ALBUMIN 3.3*   No results found for this basename: LIPASE, AMYLASE,  in the last 168 hours No results found for this basename: AMMONIA,  in the last 168 hours CBC:  Recent Labs Lab 04/30/13 1424  WBC 6.9  NEUTROABS 4.5  HGB 15.7  HCT 44.6  MCV 92.9  PLT 207   Cardiac Enzymes:  Recent Labs Lab 04/30/13 1424  TROPONINI <0.30    BNP (last 3 results) No  results found for this basename: PROBNP,  in the last 8760 hours CBG: No results found for this basename: GLUCAP,  in the last 168 hours  Radiological Exams on Admission: Ct Head Wo Contrast  04/30/2013   CLINICAL DATA:  TRANSIENT ISCHEMIC ATTACK  EXAM: CT HEAD WITHOUT CONTRAST  TECHNIQUE: Contiguous axial images were obtained from the base of the skull through the vertex without intravenous contrast.  COMPARISON:  CT HEAD W/O CM dated 02/19/2013; CT ANGIO HEAD W/CM &/OR WO/CM dated 02/19/2013  FINDINGS: Focal area of low attenuation projects adjacent to the apex of the left lateral ventricle. This finding has a central well defined focus of low attenuation and has appearance of region of subacute to chronic lacunar infarction. Global atrophy is appreciated. Diffuse areas of low attenuation project within the subcortical, deep, and periventricular white matter regions. There is no evidence of a depressed skull fracture. Visualized paranasal sinuses and mastoid air cells patent. There is no evidence of an extra-axial fluid collection nor acute hemorrhage. There is no evidence mass effect. The visualized paranasal sinuses and mastoid air cells are patent. There is no evidence of a depressed skull fracture.  IMPRESSION: Findings consistent with an area of subacute to chronic lacunar infarction left MCA distribution. Involutional and chronic changes also appreciated. There is no evidence of acute abnormalities.   Electronically Signed   By: Margaree Mackintosh M.D.   On: 04/30/2013 15:06    EKG: Independently reviewed.   Assessment/Plan Principal Problem:   TIA (transient ischemic attack) Active Problems:   Atrial fibrillation   HTN (hypertension)   Long term (current) use of anticoagulants   Cardiac pacemaker in situ- MDT 10/11   Right bundle branch block   Type II or unspecified type diabetes mellitus without mention of complication, not stated as uncontrolled   Dyslipidemia   CABG '89, SVG-OM DES  '08, low risk Nuc 2012   CVA - Rt brain stroke 12/14   Speech and language deficit due to old cerebral infarction   Fatigue    TIA -TIA symptoms with right lower extremity weakness/numbness. -CT scan of the brain showed subacute to chronic lacunar infarct. -MRI to be done, if positive neurology will be consulted. -This is discussed with Dr. Haroldine Laws of cardiology, Eliquis increased to 5 mg twice a day. -Patient is still on low-dose aspirin.  Atrial fibrillation -As mentioned above patient  follows with cardiology, already had plans for cardioversion. -Check MRI if patient has recent stroke cardiology recommended to wait for at least a month. -Recommended to switch Eliquis to 5 mg twice a day. -Continue rate control with metoprolol. Cardiology following.  Type 2 diabetes mellitus -Patient not on hypoglycemic agents, check A1c.  Hypertension -Is on metoprolol and losartan continue medications.  Permanent pacemaker -Patient has permanent pacemaker, reportedly in its MRI compatible. -Pulse Gen Model  [id number] Revo MRI -Pulse Gen Serial Number [id number]   Code Status: Full code Family Communication: Pan discussed with patient in presence of his daughter at bedside. Disposition Plan: Inpatient, neuro telemetry  Time spent: 70 minutes  [facility] Pager [phone]

## 2013-04-30 NOTE — Telephone Encounter (Signed)
Returned call to patient's daughter. She reports that patient appears to be getting worse with atrial fibrillation becoming symptomatic (SOB, fatigue, weakness in right leg, balance issues). Wanted to inform us and ask advice if should go on to ED. Stated that patient was supposed to be set up for tikosyn and possible cardioversion in 2 weeks with Dr. Debara Pickett but that family did not feel comfortable waiting that long, given the patient's presentation. Advised that our office will be closed after noon and will not be open til 10am tomorrow. Daughter reported that they are going to take patient to ED.

## 2013-04-30 NOTE — ED Notes (Signed)
Admitting physician at bedside

## 2013-04-30 NOTE — ED Provider Notes (Signed)
CSN: [id number]     Arrival date & time 04/30/13  1401 History   First MD Initiated Contact with Patient 04/30/13 1416     Chief Complaint  Patient presents with  . Numbness     (Consider location/radiation/quality/duration/timing/severity/associated sxs/prior Treatment) HPI Pt with history of PAF with resultant stroke in Dec 2014 who has residual facial droop and slurred speech has been back in afib for the last several days. Reports general weakness but no CP, SOB. He was seen by cardiology earlier this week and scheduled for Tikosyn/cardioversion in about 2 weeks. He woke up this morning with R leg 'feeling fuzzy'. States he was having trouble walking and leg did not want to do what he wanted it do to but no report of numbness or weakness in arm. The symptoms improved several hours ago and he feels back to baseline now. Family called Cards office who recommended he come to the ED for further eval of ?TIA.   Past Medical History  Diagnosis Date  . Hypertension   . Coronary artery disease   . Arthritis   . Cataract   . Anxiety   . Blood transfusion without reported diagnosis   . Hyperlipidemia   . Heart murmur   . Allergy   . Thyroid disease   . PAF (paroxysmal atrial fibrillation)     coumadin  . History of abdominal aortic aneurysm   . History of echocardiogram 12/2008    EF >55%; mild concentric LVH; mild MR; mild-mod TR; mild AV regurg;   . History of nuclear stress test 12/2010    lexiscan; low risk; compared to prior study, perfusion improved  . History of Doppler ultrasound 05/22/2012    LEAs; R anterior tibial artery appeared occluded; L posterior tibial shows short segment of occlusive ds  . History of Doppler ultrasound 05/22/2012    Abdominal Aortic Doppler; slight increase in fusiform aneurysm    Past Surgical History  Procedure Laterality Date  . Coronary artery bypass graft  1989  . Pacemaker insertion  2011  . Insert / replace / remove pacemaker    . Eye  surgery    . Joint replacement    . Hernia repair    . Coronary angioplasty with stent placement  2008    stent to SVG to OM   Family History  Problem Relation Age of Onset  . Diabetes type II Father   . Other Mother     PAD with amputations   History  Substance Use Topics  . Smoking status: Former Smoker -- 0.75 packs/day for 15 years    Types: Cigarettes    Quit date: 03/04/1967  . Smokeless tobacco: Never Used  . Alcohol Use: No    Review of Systems All other systems reviewed and are negative except as noted in HPI.     Allergies  Caduet; Crestor; Cymbalta; Lipitor; Lisinopril; and Simvastatin  Home Medications   Current Outpatient Rx  Name  Route  Sig  Dispense  Refill  . allopurinol (ZYLOPRIM) 100 MG tablet   Oral   Take 50 mg by mouth every morning.         Marland Kitchen amoxicillin (AMOXIL) 500 MG tablet   Oral   Take 500 mg by mouth. Take four tablets one hour prior to dental procedures.         Marland Kitchen apixaban (ELIQUIS) 2.5 MG TABS tablet   Oral   Take 1 tablet (2.5 mg total) by mouth 2 (two) times daily.  62 tablet   5   . aspirin EC 81 MG tablet   Oral   Take 81 mg by mouth daily.           . Cholecalciferol (VITAMIN D) 400 UNITS capsule   Oral   Take 400 Units by mouth 2 (two) times daily.          . furosemide (LASIX) 20 MG tablet   Oral   Take 1 tablet (20 mg total) by mouth daily.   90 tablet   3   . hydrOXYzine (ATARAX/VISTARIL) 10 MG tablet      One with meals and at bed to help itching   120 tablet   5   . ipratropium (ATROVENT) 0.03 % nasal spray   Each Nare   Place 2 sprays into both nostrils 3 (three) times daily.         Marland Kitchen levothyroxine (SYNTHROID, LEVOTHROID) 100 MCG tablet   Oral   Take 100 mcg by mouth daily before breakfast.         . LORazepam (ATIVAN) 0.5 MG tablet   Oral   Take 1 tablet (0.5 mg total) by mouth at bedtime as needed for anxiety or sleep.   15 tablet   0   . losartan (COZAAR) 100 MG tablet    Oral   Take 1 tablet (100 mg total) by mouth daily.   30 tablet   5   . metoprolol (LOPRESSOR) 100 MG tablet   Oral   Take 0.5 tablets (50 mg total) by mouth 2 (two) times daily.         . nitroGLYCERIN (NITROSTAT) 0.4 MG SL tablet   Sublingual   Place 0.4 mg under the tongue every 5 (five) minutes as needed. For chest pain          . polyvinyl alcohol (LIQUIFILM TEARS) 1.4 % ophthalmic solution   Both Eyes   Place 1 drop into both eyes 4 (four) times daily as needed for dry eyes.         . potassium chloride (MICRO-K) 10 MEQ CR capsule   Oral   Take 10 mEq by mouth 2 (two) times daily.         Marland Kitchen zolpidem (AMBIEN) 10 MG tablet   Oral   Take 2.5-5 mg by mouth at bedtime as needed for sleep.          BP 146/75  Pulse 73  Temp(Src) 97.7 F (36.5 C) (Oral)  Resp 17  SpO2 96% Physical Exam  Nursing note and vitals reviewed. Constitutional: He is oriented to person, place, and time. He appears well-developed and well-nourished.  HENT:  Head: Normocephalic and atraumatic.  Eyes: EOM are normal. Pupils are equal, round, and reactive to light.  Neck: Normal range of motion. Neck supple.  Cardiovascular: Normal rate, normal heart sounds and intact distal pulses.   Pulmonary/Chest: Effort normal and breath sounds normal.  Abdominal: Bowel sounds are normal. He exhibits no distension. There is no tenderness.  Musculoskeletal: Normal range of motion. He exhibits no edema and no tenderness.  Neurological: He is alert and oriented to person, place, and time. He has normal strength. A cranial nerve deficit (L facial droop from prior stroke) is present. No sensory deficit. Coordination normal.  Mild dysarthria from prior stroke  Skin: Skin is warm and dry. No rash noted.  Psychiatric: He has a normal mood and affect.    ED Course  Procedures (including critical care time) Labs Review Labs  Reviewed  PROTIME-INR - Abnormal; Notable for the following:    Prothrombin Time  17.2 (*)    All other components within normal limits  DIFFERENTIAL - Abnormal; Notable for the following:    Monocytes Relative 13 (*)    All other components within normal limits  COMPREHENSIVE METABOLIC PANEL - Abnormal; Notable for the following:    Glucose, Bld 111 (*)    BUN 28 (*)    Albumin 3.3 (*)    GFR calc non Af Amer 57 (*)    GFR calc Af Amer 66 (*)    All other components within normal limits  APTT  CBC  URINALYSIS, ROUTINE W REFLEX MICROSCOPIC  TROPONIN I  ETHANOL  URINE RAPID DRUG SCREEN (HOSP PERFORMED)  MAGNESIUM  HEMOGLOBIN A1C  LIPID PANEL   Imaging Review Ct Head Wo Contrast  04/30/2013   CLINICAL DATA:  TRANSIENT ISCHEMIC ATTACK  EXAM: CT HEAD WITHOUT CONTRAST  TECHNIQUE: Contiguous axial images were obtained from the base of the skull through the vertex without intravenous contrast.  COMPARISON:  CT HEAD W/O CM dated 02/19/2013; CT ANGIO HEAD W/CM &/OR WO/CM dated 02/19/2013  FINDINGS: Focal area of low attenuation projects adjacent to the apex of the left lateral ventricle. This finding has a central well defined focus of low attenuation and has appearance of region of subacute to chronic lacunar infarction. Global atrophy is appreciated. Diffuse areas of low attenuation project within the subcortical, deep, and periventricular white matter regions. There is no evidence of a depressed skull fracture. Visualized paranasal sinuses and mastoid air cells patent. There is no evidence of an extra-axial fluid collection nor acute hemorrhage. There is no evidence mass effect. The visualized paranasal sinuses and mastoid air cells are patent. There is no evidence of a depressed skull fracture.  IMPRESSION: Findings consistent with an area of subacute to chronic lacunar infarction left MCA distribution. Involutional and chronic changes also appreciated. There is no evidence of acute abnormalities.   Electronically Signed   By: Margaree Mackintosh M.D.   On: 04/30/2013 15:06     EKG in MUSE.   MDM   Final diagnoses:  A-fib  TIA (transient ischemic attack)    Screening neuro exam neg for acute symptoms.Pt is in afib. Will check labs and CT Head. ?TIA earlier today. Not a candidate for tPA due to minimal symptoms and unclear time of onset.   Discussed with Cards and PCP. Admit for TIA eval and further management of afib.  Miniya Miguez B. Karle Starch, MD 04/30/13 Pauline Aus

## 2013-04-30 NOTE — Telephone Encounter (Signed)
Pt seems to be getting worse,they thinks he needs to probably go to the hospital. Please call asap.

## 2013-04-30 NOTE — Consult Note (Signed)
Reason for Consult: TIA by history  Requesting Physician: ER  HPI:  80 y/o previously followed by Dr Rex Kras with a history of CAD. He had CABG in '89. In '08 he had a DES to the SVG-OM1/OM2. Myoview in 2012 was low risk. He has PAF and had been in NSR. In Aug 2014 his PFTs came back abnormal with decreased DLCO and it was felt it would be best to take him off Amiodarone. He was in NSR when he saw Dr Sallyanne Kuster in November 2014. He was admitted to the hospital 02/18/13 with a stroke. He had Rt facial droop as well as weakness in his Rt arm and leg. CT was unremarkable for a new stroke but showed chronic thalamic stroke. It was thought at the time that an MRI could not be done secondary to his pacemaker. His INR on admission was 2.0. Echo showed an EF of 40-45%. He was changed to Eliquis.             He was seen in the office on 1/21/5 and had been complaining of fatigue. He was on 1oo mg of Metoprolol BID and this was decreased to 50 mg BID. He was noted to be in AF. We offered the pt admission for Sotalol but he declined.             He followed up with Dr Sallyanne Kuster 04/27/13 continuing to complain of progressive fatigue. Interrogation of his pacemaker showed a roughly 24-hour episode of atrial fibrillation that occurred on December 9-10, a few days before his stroke. He did not have atrial fibrillation throughout his hospitalization but went back into atrial fibrillation on December 20 and was in AF on his Jan 21st office visit.. With the exception of a couple of days in late January he has been in persistent atrial fibrillation ever since. Prior to the last couple of months he had infrequent episodes of paroxysmal atrial fibrillation, generally lasting less than 24 hours, while on Amiodarone. Dr Sallyanne Kuster felt he would benefit from return to NSR and he was supposed to be admitted for Tikosyn in a week or so. He also notes that his pacemaker is an MRI conditional device. His family brought him to the ER today  after he had some tarnsient Rt leg weakness last 30 minutes. The pt's daughter accompanied him and feels he needs "to go ahead and get this atrial fibrillation fixed now".                 PMHx:  Past Medical History  Diagnosis Date  . Hypertension   . Coronary artery disease   . Arthritis   . Cataract   . Anxiety   . Blood transfusion without reported diagnosis   . Hyperlipidemia   . Heart murmur   . Allergy   . Thyroid disease   . PAF (paroxysmal atrial fibrillation)     coumadin  . History of abdominal aortic aneurysm   . History of echocardiogram 12/2008    EF >55%; mild concentric LVH; mild MR; mild-mod TR; mild AV regurg;   . History of nuclear stress test 12/2010    lexiscan; low risk; compared to prior study, perfusion improved  . History of Doppler ultrasound 05/22/2012    LEAs; R anterior tibial artery appeared occluded; L posterior tibial shows short segment of occlusive ds  . History of Doppler ultrasound 05/22/2012    Abdominal Aortic Doppler; slight increase in fusiform aneurysm   . Stroke    Past Surgical History  Procedure Laterality Date  . Coronary artery bypass graft  1989  . Pacemaker insertion  2011  . Insert / replace / remove pacemaker    . Eye surgery    . Joint replacement    . Hernia repair    . Coronary angioplasty with stent placement  2008    stent to SVG to OM    FAMHx: Diabetes.   SOCHx:  reports that he quit smoking about 46 years ago. His smoking use included Cigarettes. He has a 11.25 pack-year smoking history. He has never used smokeless tobacco. He reports that he does not drink alcohol or use illicit drugs. He lives at [facility] in an apartment but was to move to assisted living Friday.  ALLERGIES: Allergies  Allergen Reactions  . Caduet [Amlodipine-Atorvastatin] Other (See Comments)    Weakness   . Crestor [Rosuvastatin] Other (See Comments)    Muscle pain/weakness  . Cymbalta [Duloxetine Hcl] Other (See Comments)     Excessive sedation  . Lipitor [Atorvastatin Calcium] Other (See Comments)    Muscle pain/weakness  . Lisinopril Other (See Comments)    Doesn't remember reaction  . Simvastatin Other (See Comments)    Muscle pain/weakness    ROS: Pertinent items are noted in HPI. he denies chest pain. No unusual SOB, just progressive generalzed fatigue.   HOME MEDICATIONS: See Med Rec  HOSPITAL MEDICATIONS: I have reviewed the patient's current medications.  VITALS: Blood pressure 148/85, pulse 64, temperature 97.7 F (36.5 C), temperature source Oral, resp. rate 20, SpO2 96.00%.  PHYSICAL EXAM: General appearance: alert, cooperative, appears stated age, no distress and walks with a cane, slight rt facial droop  LABS: Results for orders placed during the hospital encounter of 04/30/13 (from the past 48 hour(s))  PROTIME-INR     Status: Abnormal   Collection Time    04/30/13  2:24 PM      Result Value Ref Range   Prothrombin Time 17.2 (*) 11.6 - 15.2 seconds   INR 1.44  0.00 - 1.49  APTT     Status: None   Collection Time    04/30/13  2:24 PM      Result Value Ref Range   aPTT 37  24 - 37 seconds   Comment:            IF BASELINE aPTT IS ELEVATED,     SUGGEST PATIENT RISK ASSESSMENT     BE USED TO DETERMINE APPROPRIATE     ANTICOAGULANT THERAPY.  CBC     Status: None   Collection Time    04/30/13  2:24 PM      Result Value Ref Range   WBC 6.9  4.0 - 10.5 K/uL   RBC 4.80  4.22 - 5.81 MIL/uL   Hemoglobin 15.7  13.0 - 17.0 g/dL   HCT 44.6  39.0 - 52.0 %   MCV 92.9  78.0 - 100.0 fL   MCH 32.7  26.0 - 34.0 pg   MCHC 35.2  30.0 - 36.0 g/dL   RDW 13.4  11.5 - 15.5 %   Platelets 207  150 - 400 K/uL  DIFFERENTIAL     Status: Abnormal   Collection Time    04/30/13  2:24 PM      Result Value Ref Range   Neutrophils Relative % 65  43 - 77 %   Neutro Abs 4.5  1.7 - 7.7 K/uL   Lymphocytes Relative 20  12 - 46 %   Lymphs Abs 1.4  0.7 - 4.0 K/uL   Monocytes Relative 13 (*) 3 - 12 %    Monocytes Absolute 0.9  0.1 - 1.0 K/uL   Eosinophils Relative 1  0 - 5 %   Eosinophils Absolute 0.1  0.0 - 0.7 K/uL   Basophils Relative 0  0 - 1 %   Basophils Absolute 0.0  0.0 - 0.1 K/uL  COMPREHENSIVE METABOLIC PANEL     Status: Abnormal   Collection Time    04/30/13  2:24 PM      Result Value Ref Range   Sodium 141  137 - 147 mEq/L   Potassium 4.2  3.7 - 5.3 mEq/L   Chloride 104  96 - 112 mEq/L   CO2 24  19 - 32 mEq/L   Glucose, Bld 111 (*) 70 - 99 mg/dL   BUN 28 (*) 6 - 23 mg/dL   Creatinine, Ser 1.11  0.50 - 1.35 mg/dL   Calcium 9.0  8.4 - 10.5 mg/dL   Total Protein 6.4  6.0 - 8.3 g/dL   Albumin 3.3 (*) 3.5 - 5.2 g/dL   AST 21  0 - 37 U/L   ALT 16  0 - 53 U/L   Alkaline Phosphatase 81  39 - 117 U/L   Total Bilirubin 0.7  0.3 - 1.2 mg/dL   GFR calc non Af Amer 57 (*) >90 mL/min   GFR calc Af Amer 66 (*) >90 mL/min   Comment: (NOTE)     The eGFR has been calculated using the CKD EPI equation.     This calculation has not been validated in all clinical situations.     eGFR's persistently <90 mL/min signify possible Chronic Kidney     Disease.  TROPONIN I     Status: None   Collection Time    04/30/13  2:24 PM      Result Value Ref Range   Troponin I <0.30  <0.30 ng/mL   Comment:            Due to the release kinetics of cTnI,     a negative result within the first hours     of the onset of symptoms does not rule out     myocardial infarction with certainty.     If myocardial infarction is still suspected,     repeat the test at appropriate intervals.  ETHANOL     Status: None   Collection Time    04/30/13  2:24 PM      Result Value Ref Range   Alcohol, Ethyl (B) <11  0 - 11 mg/dL   Comment:            LOWEST DETECTABLE LIMIT FOR     SERUM ALCOHOL IS 11 mg/dL     FOR MEDICAL PURPOSES ONLY  URINALYSIS, ROUTINE W REFLEX MICROSCOPIC     Status: None   Collection Time    04/30/13  3:07 PM      Result Value Ref Range   Color, Urine YELLOW  YELLOW   APPearance  CLEAR  CLEAR   Specific Gravity, Urine 1.013  1.005 - 1.030   pH 6.5  5.0 - 8.0   Glucose, UA NEGATIVE  NEGATIVE mg/dL   Hgb urine dipstick NEGATIVE  NEGATIVE   Bilirubin Urine NEGATIVE  NEGATIVE   Ketones, ur NEGATIVE  NEGATIVE mg/dL   Protein, ur NEGATIVE  NEGATIVE mg/dL   Urobilinogen, UA 0.2  0.0 - 1.0 mg/dL   Nitrite NEGATIVE  NEGATIVE  Leukocytes, UA NEGATIVE  NEGATIVE   Comment: MICROSCOPIC NOT DONE ON URINES WITH NEGATIVE PROTEIN, BLOOD, LEUKOCYTES, NITRITE, OR GLUCOSE <1000 mg/dL.  URINE RAPID DRUG SCREEN (HOSP PERFORMED)     Status: None   Collection Time    04/30/13  3:07 PM      Result Value Ref Range   Opiates NONE DETECTED  NONE DETECTED   Cocaine NONE DETECTED  NONE DETECTED   Benzodiazepines NONE DETECTED  NONE DETECTED   Amphetamines NONE DETECTED  NONE DETECTED   Tetrahydrocannabinol NONE DETECTED  NONE DETECTED   Barbiturates NONE DETECTED  NONE DETECTED   Comment:            DRUG SCREEN FOR MEDICAL PURPOSES     ONLY.  IF CONFIRMATION IS NEEDED     FOR ANY PURPOSE, NOTIFY LAB     WITHIN 5 DAYS.                LOWEST DETECTABLE LIMITS     FOR URINE DRUG SCREEN     Drug Class       Cutoff (ng/mL)     Amphetamine      1000     Barbiturate      200     Benzodiazepine   888     Tricyclics       280     Opiates          300     Cocaine          300     THC              50    EKG:   IMAGING: Ct Head Wo Contrast  04/30/2013   CLINICAL DATA:  TRANSIENT ISCHEMIC ATTACK  EXAM: CT HEAD WITHOUT CONTRAST  TECHNIQUE: Contiguous axial images were obtained from the base of the skull through the vertex without intravenous contrast.  COMPARISON:  CT HEAD W/O CM dated 02/19/2013; CT ANGIO HEAD W/CM &/OR WO/CM dated 02/19/2013  FINDINGS: Focal area of low attenuation projects adjacent to the apex of the left lateral ventricle. This finding has a central well defined focus of low attenuation and has appearance of region of subacute to chronic lacunar infarction. Global  atrophy is appreciated. Diffuse areas of low attenuation project within the subcortical, deep, and periventricular white matter regions. There is no evidence of a depressed skull fracture. Visualized paranasal sinuses and mastoid air cells patent. There is no evidence of an extra-axial fluid collection nor acute hemorrhage. There is no evidence mass effect. The visualized paranasal sinuses and mastoid air cells are patent. There is no evidence of a depressed skull fracture.  IMPRESSION: Findings consistent with an area of subacute to chronic lacunar infarction left MCA distribution. Involutional and chronic changes also appreciated. There is no evidence of acute abnormalities.   Electronically Signed   By: Margaree Mackintosh M.D.   On: 04/30/2013 15:06    IMPRESSION: Principal Problem:   TIA (transient ischemic attack) Active Problems:   Atrial fibrillation   Type II or unspecified type diabetes mellitus without mention of complication, not stated as uncontrolled   CABG '89, SVG-OM DES '08, low risk Nuc 2012   CVA - Rt brain stroke 12/14   Fatigue   HTN (hypertension)   Long term (current) use of anticoagulants   Cardiac pacemaker in situ- MDT 10/11   Right bundle branch block   Dyslipidemia   Speech and language deficit due to old cerebral infarction  RECOMMENDATION: MD to see- ? Start Tikosyn 250 BID loading with plans for eventual DCCV,  check Mg++.   Time Spent Directly with Patient: 45 minutes  Erlene Quan [phone] beeper 04/30/2013, 4:45 PM   Patient seen and examined with Kerin Ransom, PA-C. We discussed all aspects of the encounter. I agree with the assessment and plan as stated above.   Difficult situation. 80 y/o with recent CVA (12/14) in setting of PAF. Previously on amio but stopped due to ? Lung toxicity with decreased DLCO. On coumadin when he had CVA in 12/14 so switched to Eliquis 2.5 bid. Has had progressive fatigue thought due to AF. Presented today with TIA symptoms -  now resolved. Being admitted to IM. Will get MRI brain (has MRI compatible PM).   In speaking with him and his family, it does seem like he doesn't tolerate AF well due to profound fatigue. Tikosyn has been discussed with him previously. However in the setting of current TIA, I would be very hesitant to proceed with Tikosyn load or DC-CV even with a TEE. I would recommend increasing Eliquis to 5 bid (they are aware of bleeding risks) and then bringing him back in 4 weeks or so for Tikosyn load and potential DC-CV. They agree with this strategy. Volume status seems mildly elevated. Can diurese gently as tolerated.   Benay Spice 5:54 PM

## 2013-04-30 NOTE — ED Notes (Addendum)
Pt arrives via EMS from home with right sided leg weakness that began about 730 this AM. Later in the morning symptoms completely resolved. Stroke scale neg for EMS. Pt scheduled for outpt cardioversion for Afib on March 9th. Sent over by cardiology office for further workup. Pt with 18g IV LAC. Pt awake, alert, oriented x4, VSS.

## 2013-05-01 ENCOUNTER — Encounter (HOSPITAL_COMMUNITY): Payer: Self-pay | Admitting: *Deleted

## 2013-05-01 ENCOUNTER — Inpatient Hospital Stay (HOSPITAL_COMMUNITY): Payer: Medicare Other

## 2013-05-01 DIAGNOSIS — I5023 Acute on chronic systolic (congestive) heart failure: Secondary | ICD-10-CM | POA: Diagnosis not present

## 2013-05-01 DIAGNOSIS — Z7901 Long term (current) use of anticoagulants: Secondary | ICD-10-CM

## 2013-05-01 DIAGNOSIS — I10 Essential (primary) hypertension: Secondary | ICD-10-CM | POA: Diagnosis not present

## 2013-05-01 DIAGNOSIS — E119 Type 2 diabetes mellitus without complications: Secondary | ICD-10-CM | POA: Diagnosis not present

## 2013-05-01 DIAGNOSIS — I6350 Cerebral infarction due to unspecified occlusion or stenosis of unspecified cerebral artery: Principal | ICD-10-CM

## 2013-05-01 DIAGNOSIS — I4891 Unspecified atrial fibrillation: Secondary | ICD-10-CM | POA: Diagnosis not present

## 2013-05-01 DIAGNOSIS — G459 Transient cerebral ischemic attack, unspecified: Secondary | ICD-10-CM | POA: Diagnosis not present

## 2013-05-01 LAB — LIPID PANEL
Cholesterol: 117 mg/dL (ref 0–200)
HDL: 30 mg/dL — ABNORMAL LOW (ref >39)
LDL CALC: 71 mg/dL (ref 0–99)
Total CHOL/HDL Ratio: 3.9 RATIO
Triglycerides: 79 mg/dL (ref <150)
VLDL: 16 mg/dL (ref 0–40)

## 2013-05-01 LAB — HEMOGLOBIN A1C
Hgb A1c MFr Bld: 6.3 % — ABNORMAL HIGH
Mean Plasma Glucose: 134 mg/dL — ABNORMAL HIGH

## 2013-05-01 NOTE — Progress Notes (Signed)
[facility] PROGRESS NOTE  Jon [name] WCH:[id number] DOB: 09-16-1921 DOA: 04/30/2013 PCP: [name], MD  Assessment/Plan: TIA  -TIA symptoms with right lower extremity weakness/numbness.  -CT scan of the brain showed subacute to chronic lacunar infarct.  -MRI to be done, if positive neurology will be consulted.  - Eliquis increased to 5 mg twice a day. Per dr bensimon -Patient is still on low-dose aspirin.   Atrial fibrillation  -As mentioned above patient follows with cardiology, already had plans for cardioversion.  -Check MRI if patient has recent stroke cardiology recommended to wait for at least a month.  -Recommended to switch Eliquis to 5 mg twice a day.  -Continue rate control with metoprolol. Cardiology following.   Type 2 diabetes mellitus  -Patient not on any agents, check A1c.   Hypertension  -Is on metoprolol and losartan continue medications.   Permanent pacemaker  -Patient has permanent pacemaker, reportedly in its MRI compatible.  -Pulse Gen Model [id number] Revo MRI  -Pulse Gen Serial Number [id number]      Code Status: full Family Communication: patient Disposition Plan:    Consultants:  cards  Procedures:    Antibiotics:    HPI/Subjective: Feeling better awaiting card so he can have MRI  Objective: Filed Vitals:   05/01/13 1115  BP: 154/63  Pulse: 72  Temp: 97.8 F (36.6 C)  Resp: 18   No intake or output data in the 24 hours ending 05/01/13 1129 There were no vitals filed for this visit.  Exam:  General: Appears calm and comfortable  Cardiovascular: RRR, no m/r/g. No LE edema.  Respiratory: CTA bilaterally, no w/r/r. Normal respiratory effort.  Abdomen: soft, ntnd  Skin: no rash or induration seen on limited exam  Musculoskeletal: grossly normal tone BUE/BLE  Psychiatric: grossly normal mood and affect, speech fluent and appropriate  Neurologic: grossly non-focal.    Data Reviewed: Basic Metabolic  Panel:  Recent Labs Lab 04/30/13 1424 04/30/13 1425  NA 141  --   K 4.2  --   CL 104  --   CO2 24  --   GLUCOSE 111*  --   BUN 28*  --   CREATININE 1.11  --   CALCIUM 9.0  --   MG  --  2.2   Liver Function Tests:  Recent Labs Lab 04/30/13 1424  AST 21  ALT 16  ALKPHOS 81  BILITOT 0.7  PROT 6.4  ALBUMIN 3.3*   No results found for this basename: LIPASE, AMYLASE,  in the last 168 hours No results found for this basename: AMMONIA,  in the last 168 hours CBC:  Recent Labs Lab 04/30/13 1424  WBC 6.9  NEUTROABS 4.5  HGB 15.7  HCT 44.6  MCV 92.9  PLT 207   Cardiac Enzymes:  Recent Labs Lab 04/30/13 1424  TROPONINI <0.30   BNP (last 3 results) No results found for this basename: PROBNP,  in the last 8760 hours CBG: No results found for this basename: GLUCAP,  in the last 168 hours  No results found for this or any previous visit (from the past 240 hour(s)).   Studies: Dg Chest 2 View  04/30/2013   CLINICAL DATA:  Stroke.  Ex-smoker.  EXAM: CHEST  2 VIEW  COMPARISON:  02/19/2013.  FINDINGS: The cardiac silhouette remains borderline enlarged. Stable post CABG changes and left subclavian pacemaker leads. Clear lungs with normal vascularity. Minimal linear atelectasis at both lung bases. Diffuse osteopenia.  IMPRESSION: Minimal bibasilar linear atelectasis.  Electronically Signed   By: Enrique Sack M.D.   On: 04/30/2013 21:46   Ct Head Wo Contrast  04/30/2013   CLINICAL DATA:  TRANSIENT ISCHEMIC ATTACK  EXAM: CT HEAD WITHOUT CONTRAST  TECHNIQUE: Contiguous axial images were obtained from the base of the skull through the vertex without intravenous contrast.  COMPARISON:  CT HEAD W/O CM dated 02/19/2013; CT ANGIO HEAD W/CM &/OR WO/CM dated 02/19/2013  FINDINGS: Focal area of low attenuation projects adjacent to the apex of the left lateral ventricle. This finding has a central well defined focus of low attenuation and has appearance of region of subacute to chronic  lacunar infarction. Global atrophy is appreciated. Diffuse areas of low attenuation project within the subcortical, deep, and periventricular white matter regions. There is no evidence of a depressed skull fracture. Visualized paranasal sinuses and mastoid air cells patent. There is no evidence of an extra-axial fluid collection nor acute hemorrhage. There is no evidence mass effect. The visualized paranasal sinuses and mastoid air cells are patent. There is no evidence of a depressed skull fracture.  IMPRESSION: Findings consistent with an area of subacute to chronic lacunar infarction left MCA distribution. Involutional and chronic changes also appreciated. There is no evidence of acute abnormalities.   Electronically Signed   By: Margaree Mackintosh M.D.   On: 04/30/2013 15:06    Scheduled Meds: . allopurinol  50 mg Oral Daily  . apixaban  5 mg Oral BID  . aspirin EC  81 mg Oral Daily  . furosemide  20 mg Oral Daily  . hydrOXYzine  10 mg Oral TID WC & HS  . levothyroxine  100 mcg Oral QAC breakfast  . losartan  100 mg Oral Daily  . 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 If 7PM-7AM, please contact night-coverage at [email or web address], password [facility] 05/01/2013, 11:29 AM  LOS: 1 day

## 2013-05-01 NOTE — Progress Notes (Signed)
OT Cancellation Note  Patient Details Name: Jon [name] MRN: [id number] DOB: Mar 15, 1921   Cancelled Treatment:    Reason Eval/Treat Not Completed: Patient at procedure or test/ unavailable - Pt gone for MRI.  Will reattempt  Walker Kehr [phone] 05/01/2013, 12:50 PM

## 2013-05-01 NOTE — Progress Notes (Signed)
    Subjective:  No SOB, his Rt leg weakness resolved in 30 minutes yesterday.  Objective:  Vital Signs in the last 24 hours: Temp:  [97.2 F (36.2 C)-98.9 F (37.2 C)] 98.9 F (37.2 C) (02/25 0559) Pulse Rate:  [63-85] 74 (02/25 0559) Resp:  [13-23] 18 (02/25 0559) BP: (136-175)/(58-95) 162/88 mmHg (02/25 0559) SpO2:  [94 %-98 %] 95 % (02/25 0559)  Intake/Output from previous day: No intake or output data in the 24 hours ending 05/01/13 0909  Physical Exam: General appearance: alert, cooperative and no distress Lungs: clear to auscultation bilaterally Heart: irregularly irregular rhythm   Rate: 74  Rhythm: atrial fibrillation  Lab Results:  Recent Labs  04/30/13 1424  WBC 6.9  HGB 15.7  PLT 207    Recent Labs  04/30/13 1424  NA 141  K 4.2  CL 104  CO2 24  GLUCOSE 111*  BUN 28*  CREATININE 1.11    Recent Labs  04/30/13 1424  TROPONINI <0.30    Recent Labs  04/30/13 1424  INR 1.44    Imaging: Imaging results have been reviewed  Cardiac Studies:  Assessment/Plan:   Principal Problem:   TIA (transient ischemic attack) Active Problems:   Atrial fibrillation   CABG '89, SVG-OM DES '08, low risk Nuc 2012   CVA - Rt brain stroke 12/14   Fatigue   HTN (hypertension)   Long term (current) use of anticoagulants   Cardiac pacemaker in situ- MDT 10/11   Right bundle branch block   Type II or unspecified type diabetes mellitus without mention of complication, not stated as uncontrolled   Dyslipidemia   Speech and language deficit 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 MRI has been ordered (he has an MRI compatible pacemaker).   Kerin Ransom PA-C [facility] [phone] 05/01/2013, 9:09 AM

## 2013-05-01 NOTE — Progress Notes (Signed)
UR complete.  Braxton Vantrease RN, MSN 

## 2013-05-01 NOTE — Progress Notes (Addendum)
We should stop aspirin with him on full dose anticoagulation to decrease risk of bleeding. No weights or I/O recorded.

## 2013-05-01 NOTE — Evaluation (Signed)
Physical Therapy Evaluation Patient Details Name: Jon [name] MRN: [id number] DOB: 10-08-1921 Today's Date: 05/01/2013 Time: 5643-3295 PT Time Calculation (min): 22 min  PT Assessment / Plan / Recommendation History of Present Illness  pt presents with generalized weakness and recent hx of CVA.    Clinical Impression  Pt is at baseline level of mobility and demos good safety with RW.  Pt indicates plan to move to ALF level at [facility] upon D/C from acute.  Pt does not need acute therapy services at this time.  Will sign off.      PT Assessment  Patent does not need any further PT services    Follow Up Recommendations  No PT follow up    Does the patient have the potential to tolerate intense rehabilitation      Barriers to Discharge        Equipment Recommendations  None recommended by PT    Recommendations for Other Services     Frequency      Precautions / Restrictions Precautions Precautions: None Restrictions Weight Bearing Restrictions: No   Pertinent Vitals/Pain Denied pain.        Mobility  Transfers Overall transfer level: Modified independent Equipment used: Rolling walker (2 wheeled) General transfer comment: Demos good technique.   Ambulation/Gait Ambulation/Gait assistance: Modified independent (Device/Increase time) Ambulation Distance (Feet): 300 Feet Assistive device: Rolling walker (2 wheeled) Gait Pattern/deviations: Step-through pattern;Decreased stride length;Trunk flexed General Gait Details: pt demos good use of RW and good safety.   Modified Rankin (Stroke Patients Only) Pre-Morbid Rankin Score: Moderate disability Modified Rankin: Moderate disability    Exercises     PT Diagnosis:    PT Problem List:   PT Treatment Interventions:       PT Goals(Current goals can be found in the care plan section) Acute Rehab PT Goals PT Goal Formulation: No goals set, d/c therapy  Visit Information  Last PT Received On:  05/01/13 Assistance Needed: +1 History of Present Illness: pt presents with generalized weakness and recent hx of CVA.         Prior Functioning  Home Living Family/patient expects to be discharged to:: Assisted living Home Equipment: Walker - 4 wheels;Cane - quad;Bedside commode;Grab bars - toilet;Grab bars - tub/shower;Adaptive equipment Adaptive Equipment: Reacher;Sock aid;Long-handled shoe horn Prior Function Level of Independence: Independent with assistive device(s) Communication Communication: No difficulties Dominant Hand: Right    Cognition  Cognition Arousal/Alertness: Awake/alert Behavior During Therapy: WFL for tasks assessed/performed Overall Cognitive Status: Within Functional Limits for tasks assessed    Extremity/Trunk Assessment Upper Extremity Assessment Upper Extremity Assessment: Overall WFL for tasks assessed Lower Extremity Assessment Lower Extremity Assessment: Overall WFL for tasks assessed Cervical / Trunk Assessment Cervical / Trunk Assessment: Kyphotic   Balance Balance Overall balance assessment: Modified Independent  End of Session PT - End of Session Activity Tolerance: Patient tolerated treatment well Patient left: in chair;with call bell/phone within reach Nurse Communication: Mobility status  GP     Catarina Hartshorn, F[place] 05/01/2013, 3:29 PM

## 2013-05-01 NOTE — Progress Notes (Signed)
OT Cancellation Note  Patient Details Name: Jon [name] MRN: [id number] DOB: Aug 01, 1921   Cancelled Treatment:    Reason Eval/Treat Not Completed: OT screened, no needs identified, will sign off - Per PT, pt and dtrs feels he is at baseline.  He lives at [facility], has AE for LB ADLs and all DME.  He does not drive.  Will sign off.   Lucille Passy M [phone] 05/01/2013, 3:21 PM

## 2013-05-01 NOTE — Evaluation (Signed)
SLP Cancellation Note  Patient Details Name: Jon [name] MRN: [id number] DOB: 04-09-1921   Cancelled treatment:       Reason Eval/Treat Not Completed: Patient at procedure or test/unavailable   Claudie Fisherman, [place] [facility] SLP [phone]

## 2013-05-01 NOTE — Clinical Social Work Psychosocial (Signed)
Clinical Social Work Department BRIEF PSYCHOSOCIAL ASSESSMENT 05/01/2013  Patient:  Jon [name], Jon [name]     Account Number:  [id number]     Admit date:  04/30/2013  Clinical Social Worker:  Daiva Huge  Date/Time:  05/01/2013 02:56 PM  Referred by:  CSW  Date Referred:  05/01/2013 Referred for  ALF Placement   Other Referral:   Interview type:  Patient Other interview type:    PSYCHOSOCIAL DATA Living Status:  FACILITY Admitted from facility:  [facility] Level of care:  Independent Living Primary support name:  WIFE, 3 ADULT CHILDREN Primary support relationship to patient:  FAMILY Degree of support available:   GOOD    CURRENT CONCERNS Current Concerns  Post-Acute Placement   Other Concerns:    SOCIAL WORK ASSESSMENT / PLAN Patient admitted from [place] where he has resided for 4 years- his wife of 86 years  has moved to the ALF unit already and he had plans to move there this Friday prior to this admission- PT is working with him now and feels he will be able to go to ALF at d/c- I will coordiante this with the ALF staff and complete FL2   Assessment/plan status:  Other - See comment Other assessment/ plan:   FL2 and [place] for ALF to be completed and sent to [facility]   Information/referral to community resources:    PATIENT'S/FAMILY'S RESPONSE TO PLAN OF CARE: Patient is looking forward to his move to the ALF unit where he will be 2 doors down from his wife-  Will await callback from ALF staff to confirm this [place], MSW, [place]

## 2013-05-02 DIAGNOSIS — I4891 Unspecified atrial fibrillation: Secondary | ICD-10-CM | POA: Diagnosis not present

## 2013-05-02 DIAGNOSIS — I5023 Acute on chronic systolic (congestive) heart failure: Secondary | ICD-10-CM | POA: Diagnosis not present

## 2013-05-02 DIAGNOSIS — E119 Type 2 diabetes mellitus without complications: Secondary | ICD-10-CM | POA: Diagnosis not present

## 2013-05-02 DIAGNOSIS — I6350 Cerebral infarction due to unspecified occlusion or stenosis of unspecified cerebral artery: Secondary | ICD-10-CM | POA: Diagnosis not present

## 2013-05-02 DIAGNOSIS — I10 Essential (primary) hypertension: Secondary | ICD-10-CM | POA: Diagnosis not present

## 2013-05-02 DIAGNOSIS — I509 Heart failure, unspecified: Secondary | ICD-10-CM | POA: Diagnosis not present

## 2013-05-02 MED ORDER — METOPROLOL TARTRATE 50 MG PO TABS
75.0000 mg | ORAL_TABLET | Freq: Two times a day (BID) | ORAL | Status: DC
  Administered 2013-05-02 – 2013-05-03 (×2): 75 mg via ORAL
  Filled 2013-05-02 (×4): qty 1

## 2013-05-02 NOTE — Progress Notes (Signed)
Subjective: Doing well.  Objective: Vital signs in last 24 hours: Temp:  [97.3 F (36.3 C)-97.8 F (36.6 C)] 97.5 F (36.4 C) (02/26 0833) Pulse Rate:  [64-74] 71 (02/26 0833) Resp:  [18-20] 20 (02/26 0833) BP: (123-190)/(56-96) 186/72 mmHg (02/26 0834) SpO2:  [93 %-99 %] 93 % (02/26 0833) Last BM Date: 05/01/13  Intake/Output from previous day: 02/25 0701 - 02/26 0700 In: 240 [P.O.:240] Out: -  Intake/Output this shift:    Medications Current Facility-Administered Medications  Medication Dose Route Frequency Provider Last Rate Last Dose  . allopurinol (ZYLOPRIM) tablet 50 mg  50 mg Oral Daily Verlee Monte, MD   50 mg at 05/02/13 1015  . apixaban (ELIQUIS) tablet 5 mg  5 mg Oral BID Verlee Monte, MD   5 mg at 05/02/13 1014  . furosemide (LASIX) tablet 20 mg  20 mg Oral Daily Verlee Monte, MD   20 mg at 05/02/13 1014  . hydrOXYzine (ATARAX/VISTARIL) tablet 10 mg  10 mg Oral TID WC & HS Verlee Monte, MD   10 mg at 05/02/13 0751  . ipratropium (ATROVENT) 0.03 % nasal spray 1 spray  1 spray Each Nare TID PRN Verlee Monte, MD      . levothyroxine (SYNTHROID, LEVOTHROID) tablet 100 mcg  100 mcg Oral QAC breakfast Verlee Monte, MD   100 mcg at 05/02/13 0751  . losartan (COZAAR) tablet 100 mg  100 mg Oral Daily Verlee Monte, MD   100 mg at 05/02/13 1014  . metoprolol (LOPRESSOR) tablet 50 mg  50 mg Oral BID Verlee Monte, MD   50 mg at 05/02/13 1014  . polyvinyl alcohol (LIQUIFILM TEARS) 1.4 % ophthalmic solution 1 drop  1 drop Both Eyes QID PRN Verlee Monte, MD   1 drop at 05/01/13 2141  . zolpidem (AMBIEN) tablet 2.5-5 mg  2.5-5 mg Oral QHS PRN Verlee Monte, MD        PE: General appearance: alert, cooperative and no distress Lungs: clear to auscultation bilaterally Heart: irregularly irregular rhythm Extremities: No LEE Pulses: 2+ and symmetric Skin: Warm and dry  Lab Results:   Recent Labs  04/30/13 1424  WBC 6.9  HGB 15.7  HCT 44.6  PLT 207   BMET  Recent  Labs  04/30/13 1424  NA 141  K 4.2  CL 104  CO2 24  GLUCOSE 111*  BUN 28*  CREATININE 1.11  CALCIUM 9.0   PT/INR  Recent Labs  04/30/13 1424  LABPROT 17.2*  INR 1.44   Cholesterol  Recent Labs  05/01/13 0528  CHOL 117   Lipid Panel     Component Value Date/Time   CHOL 117 05/01/2013 0528   TRIG 79 05/01/2013 0528   HDL 30* 05/01/2013 0528   CHOLHDL 3.9 05/01/2013 0528   VLDL 16 05/01/2013 0528   LDLCALC 71 05/01/2013 0528   MRA Head  IMPRESSION: 1. Small acute/subacute infarct left corona radiata. No mass effect or hemorrhage. 2. Three superimposed punctate acute lacunar infarcts (right caudate nucleus, left occipital pole, left left parietal lobe). These could reflect synchronous small vessel ischemia rather than a recent embolic event. 3. Underlying chronic small vessel ischemia. 4. Intracranial atherosclerosis. Fusiform aneurysmal enlargement of the left ICA (cavernous segment) and right ICA terminus. No anterior circulation major branch occlusion. 5. Atherosclerosis in stenosis of the distal right vertebral artery which appears hemodynamically significant. Other posterior circulation atherosclerosis without stenosis.   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 Previously on amio but stopped due to ? Lung toxicity with decreased DLCO. On coumadin when he had CVA in 12/14 so switched to Eliquis 2.5 bid which was increased this  adm to 5mg . Has had progressive fatigue thought due to AF. Presented with TIA symptoms - now resolved.  Has MRI compatible PM.     Per Dr. Clayborne Dana plan:  Load tikosyn in 4 weeks then attempt DCCV.  Rate is currently controlled. BP is running high.  On lopressor 50mg  bid, cozaar 100mg  daily.  Lasix 20mg  daily.  Consider increasing lopressor.   MRA head:  Small acute/subacute infarct left corona radiata. No mass effect or hemorrhage. Three superimposed punctate acute lacunar infarcts (right caudate nucleus, left occipital pole, left left parietal lobe). These could reflect synchronous small vessel ischemia rather than a recent embolic event.  Underlying chronic small vessel ischemia.   LOS: 2 days    Jon [name] 05/02/2013 10:39 AM

## 2013-05-02 NOTE — Progress Notes (Addendum)
[facility] PROGRESS NOTE  Jon [name] [id number] DOB: 1922/01/12 DOA: 04/30/2013 PCP: [name], MD  Assessment/Plan: CVA - right lower extremity weakness/numbness.  -CT scan of the brain showed subacute to chronic lacunar infarct.  -MRI to be done, positive: consult neuro  - Eliquis increased to 5 mg twice a day. Per dr bensimon -Patient is still on low-dose aspirin.   Atrial fibrillation  -As mentioned above patient follows with cardiology, already had plans for cardioversion.  -Check MRI if patient has recent stroke cardiology recommended to wait for at least a month.  -Recommended to switch Eliquis to 5 mg twice a day.  -Continue rate control with metoprolol. Cardiology following.   Type 2 diabetes mellitus  -Patient not on any agents, check A1c.   Hypertension  -Is on metoprolol and losartan continue medications.   Permanent pacemaker  -Patient has permanent pacemaker, reportedly in its MRI compatible.  -Pulse Gen Model [id number] Revo MRI  -Pulse Gen Serial Number [id number]      Code Status: full Family Communication: patient Disposition Plan: Friend's Home   Consultants:  cards  Procedures:    Antibiotics:    HPI/Subjective: +MRI Wanting to go home   Objective: Filed Vitals:   05/02/13 0834  BP: 186/72  Pulse:   Temp:   Resp:    No intake or output data in the 24 hours ending 05/02/13 0906 There were no vitals filed for this visit.  Exam:  General: Appears calm and comfortable  Cardiovascular: RRR, no m/r/g. No LE edema.  Respiratory: CTA bilaterally, no w/r/r. Normal respiratory effort.  Abdomen: soft, ntnd  Skin: no rash or induration seen on limited exam  Musculoskeletal: grossly normal tone BUE/BLE  Psychiatric: grossly normal mood and affect, speech fluent and appropriate  Neurologic: grossly non-focal.    Data Reviewed: Basic Metabolic Panel:  Recent Labs Lab 04/30/13 1424 04/30/13 1425  NA 141  --    K 4.2  --   CL 104  --   CO2 24  --   GLUCOSE 111*  --   BUN 28*  --   CREATININE 1.11  --   CALCIUM 9.0  --   MG  --  2.2   Liver Function Tests:  Recent Labs Lab 04/30/13 1424  AST 21  ALT 16  ALKPHOS 81  BILITOT 0.7  PROT 6.4  ALBUMIN 3.3*   No results found for this basename: LIPASE, AMYLASE,  in the last 168 hours No results found for this basename: AMMONIA,  in the last 168 hours CBC:  Recent Labs Lab 04/30/13 1424  WBC 6.9  NEUTROABS 4.5  HGB 15.7  HCT 44.6  MCV 92.9  PLT 207   Cardiac Enzymes:  Recent Labs Lab 04/30/13 1424  TROPONINI <0.30   BNP (last 3 results) No results found for this basename: PROBNP,  in the last 8760 hours CBG: No results found for this basename: GLUCAP,  in the last 168 hours  No results found for this or any previous visit (from the past 240 hour(s)).   Studies: Dg Chest 2 View  04/30/2013   CLINICAL DATA:  Stroke.  Ex-smoker.  EXAM: CHEST  2 VIEW  COMPARISON:  02/19/2013.  FINDINGS: The cardiac silhouette remains borderline enlarged. Stable post CABG changes and left subclavian pacemaker leads. Clear lungs with normal vascularity. Minimal linear atelectasis at both lung bases. Diffuse osteopenia.  IMPRESSION: Minimal bibasilar linear atelectasis.   Electronically Signed   By: Georg Ruddle.D.  On: 04/30/2013 21:46   Ct Head Wo Contrast  04/30/2013   CLINICAL DATA:  TRANSIENT ISCHEMIC ATTACK  EXAM: CT HEAD WITHOUT CONTRAST  TECHNIQUE: Contiguous axial images were obtained from the base of the skull through the vertex without intravenous contrast.  COMPARISON:  CT HEAD W/O CM dated 02/19/2013; CT ANGIO HEAD W/CM &/OR WO/CM dated 02/19/2013  FINDINGS: Focal area of low attenuation projects adjacent to the apex of the left lateral ventricle. This finding has a central well defined focus of low attenuation and has appearance of region of subacute to chronic lacunar infarction. Global atrophy is appreciated. Diffuse areas of low  attenuation project within the subcortical, deep, and periventricular white matter regions. There is no evidence of a depressed skull fracture. Visualized paranasal sinuses and mastoid air cells patent. There is no evidence of an extra-axial fluid collection nor acute hemorrhage. There is no evidence mass effect. The visualized paranasal sinuses and mastoid air cells are patent. There is no evidence of a depressed skull fracture.  IMPRESSION: Findings consistent with an area of subacute to chronic lacunar infarction left MCA distribution. Involutional and chronic changes also appreciated. There is no evidence of acute abnormalities.   Electronically Signed   By: Margaree Mackintosh M.D.   On: 04/30/2013 15:06   Mr Brain Wo Contrast  05/01/2013   CLINICAL DATA:  80 year old male with previous stroke, right lower extremity weakness. Awoke with increased weakness today. History of atrial fibrillation, with [employer] pacemaker (monitored by [employer] representative and nursing staff for this exam with no complications observed). Initial encounter.  EXAM: MRI HEAD WITHOUT CONTRAST  MRA HEAD WITHOUT CONTRAST  TECHNIQUE: Multiplanar, multiecho pulse sequences of the brain and surrounding structures were obtained without intravenous contrast. Angiographic images of the head were obtained using MRA technique without contrast.  COMPARISON:  Head CTs 04/30/2013 and earlier.  FINDINGS: MRI HEAD FINDINGS  Round/oval 15 mm area of diffusion signal abnormality in the left posterior corona radiata extending toward the insula (series 4, image 16, series 3, image 21) does show mild decreased diffusion on ADC. Associated T2 and FLAIR hyperintensity without mass effect or hemorrhage.  Additionally there are 3 other punctate areas of restricted diffusion identified, 1 in the posterior left parietal lobe, 1 in the left occipital pole, and 1 in the contralateral right caudate nucleus (series 3 images 17 and).  Major intracranial vascular  flow voids are preserved.  Patchy and confluent cerebral white matter T2 and FLAIR hyperintensity superimposed on the above findings. Chronic lacunar infarcts in the deep gray matter nuclei, most notably of the right thalamus. Chronic lacunar infarcts in the left cerebellar hemisphere. Mild T2 and FLAIR hyperintensity in the pons. Occasional chronic micro hemorrhages in the brain (right thalamus series 10, image 13).  Visible internal auditory structures appear normal. Negative pituitary, cervicomedullary junction and visualized cervical spine. Normal bone marrow signal. No ventriculomegaly. No midline shift, mass effect, or evidence of intracranial mass lesion. Stable cerebral volume.  Postoperative changes to the globes. Visualized paranasal sinuses and mastoids are clear. Visualized scalp soft tissues are within normal limits.  MRA HEAD FINDINGS  Antegrade flow in the posterior circulation. Dominant distal left vertebral artery. Irregularity of the distal right vertebral artery suggesting tandem stenosis. Still, the right PICA origin remains patent. Left vertebral primarily supplies the basilar. Patent left PICA origin. Basilar artery irregularity but no definite stenosis. SCA and left PCA origins are normal. Fetal type right PCA origin. Normal left posterior communicating artery. Bilateral PCA branches within  normal limits.  Antegrade flow in both carotid termini. Fusiform enlargement of the cavernous left ICA up to 9 mm diameter. There is also fusiform enlargement of the right ICA terminus, up to 6 mm diameter. Evidence of calcified ICA plaque bilaterally, but no definite ICA stenosis.  Patent carotid termini. Ophthalmic and posterior communicating artery origins are within normal limits. MCA and ACA origins are patent. Dominant left ACA A1 segment. Anterior communicating artery and visualized ACA branches are within normal limits. Left MCA branches are within normal limits.  Irregularity suggesting calcified  plaque at the right ICA origin, but mild if any stenosis. Right MCA M1 segment remains patent. Visualized right MCA branches grossly normal.  IMPRESSION: 1. Small acute/subacute infarct left corona radiata. No mass effect or hemorrhage. 2. Three superimposed punctate acute lacunar infarcts (right caudate nucleus, left occipital pole, left left parietal lobe). These could reflect synchronous small vessel ischemia rather than a recent embolic event. 3. Underlying chronic small vessel ischemia. 4. Intracranial atherosclerosis. Fusiform aneurysmal enlargement of the left ICA (cavernous segment) and right ICA terminus. No anterior circulation major branch occlusion. 5. Atherosclerosis in stenosis of the distal right vertebral artery which appears hemodynamically significant. Other posterior circulation atherosclerosis without stenosis.   Electronically Signed   By: Lars Pinks M.D.   On: 05/01/2013 13:59   Mr Jodene Nam Head/brain Wo Cm  05/01/2013   CLINICAL DATA:  80 year old male with previous stroke, right lower extremity weakness. Awoke with increased weakness today. History of atrial fibrillation, with [employer] pacemaker (monitored by [employer] representative and nursing staff for this exam with no complications observed). Initial encounter.  EXAM: MRI HEAD WITHOUT CONTRAST  MRA HEAD WITHOUT CONTRAST  TECHNIQUE: Multiplanar, multiecho pulse sequences of the brain and surrounding structures were obtained without intravenous contrast. Angiographic images of the head were obtained using MRA technique without contrast.  COMPARISON:  Head CTs 04/30/2013 and earlier.  FINDINGS: MRI HEAD FINDINGS  Round/oval 15 mm area of diffusion signal abnormality in the left posterior corona radiata extending toward the insula (series 4, image 16, series 3, image 21) does show mild decreased diffusion on ADC. Associated T2 and FLAIR hyperintensity without mass effect or hemorrhage.  Additionally there are 3 other punctate areas of  restricted diffusion identified, 1 in the posterior left parietal lobe, 1 in the left occipital pole, and 1 in the contralateral right caudate nucleus (series 3 images 17 and).  Major intracranial vascular flow voids are preserved.  Patchy and confluent cerebral white matter T2 and FLAIR hyperintensity superimposed on the above findings. Chronic lacunar infarcts in the deep gray matter nuclei, most notably of the right thalamus. Chronic lacunar infarcts in the left cerebellar hemisphere. Mild T2 and FLAIR hyperintensity in the pons. Occasional chronic micro hemorrhages in the brain (right thalamus series 10, image 13).  Visible internal auditory structures appear normal. Negative pituitary, cervicomedullary junction and visualized cervical spine. Normal bone marrow signal. No ventriculomegaly. No midline shift, mass effect, or evidence of intracranial mass lesion. Stable cerebral volume.  Postoperative changes to the globes. Visualized paranasal sinuses and mastoids are clear. Visualized scalp soft tissues are within normal limits.  MRA HEAD FINDINGS  Antegrade flow in the posterior circulation. Dominant distal left vertebral artery. Irregularity of the distal right vertebral artery suggesting tandem stenosis. Still, the right PICA origin remains patent. Left vertebral primarily supplies the basilar. Patent left PICA origin. Basilar artery irregularity but no definite stenosis. SCA and left PCA origins are normal. Fetal type right PCA origin. Normal  left posterior communicating artery. Bilateral PCA branches within normal limits.  Antegrade flow in both carotid termini. Fusiform enlargement of the cavernous left ICA up to 9 mm diameter. There is also fusiform enlargement of the right ICA terminus, up to 6 mm diameter. Evidence of calcified ICA plaque bilaterally, but no definite ICA stenosis.  Patent carotid termini. Ophthalmic and posterior communicating artery origins are within normal limits. MCA and ACA origins  are patent. Dominant left ACA A1 segment. Anterior communicating artery and visualized ACA branches are within normal limits. Left MCA branches are within normal limits.  Irregularity suggesting calcified plaque at the right ICA origin, but mild if any stenosis. Right MCA M1 segment remains patent. Visualized right MCA branches grossly normal.  IMPRESSION: 1. Small acute/subacute infarct left corona radiata. No mass effect or hemorrhage. 2. Three superimposed punctate acute lacunar infarcts (right caudate nucleus, left occipital pole, left left parietal lobe). These could reflect synchronous small vessel ischemia rather than a recent embolic event. 3. Underlying chronic small vessel ischemia. 4. Intracranial atherosclerosis. Fusiform aneurysmal enlargement of the left ICA (cavernous segment) and right ICA terminus. No anterior circulation major branch occlusion. 5. Atherosclerosis in stenosis of the distal right vertebral artery which appears hemodynamically significant. Other posterior circulation atherosclerosis without stenosis.   Electronically Signed   By: Lars Pinks M.D.   On: 05/01/2013 13:59    Scheduled Meds: . allopurinol  50 mg Oral Daily  . apixaban  5 mg Oral BID  . furosemide  20 mg Oral Daily  . hydrOXYzine  10 mg Oral TID WC & HS  . levothyroxine  100 mcg Oral QAC breakfast  . losartan  100 mg Oral Daily  . 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 If 7PM-7AM, please contact night-coverage at [email or web address], password [facility] 05/02/2013, 9:06 AM  LOS: 2 days

## 2013-05-02 NOTE — Progress Notes (Signed)
Face asymmetry has improved. BP is high Increase the Metoprolol to 75 mg BID

## 2013-05-02 NOTE — Social Work (Signed)
Updated [facility] that patient will not be ready today but we anticipate tomorrow- this will be fine as they are planning moving his furniture in for admission tomorrow-   Eduard Clos, MSW, [place]

## 2013-05-02 NOTE — Evaluation (Signed)
Speech Language Pathology Evaluation Patient Details Name: Jon [name] MRN: [id number] DOB: 01/18/1922 Today's Date: 05/02/2013 Time: 5732-2025 SLP Time Calculation (min): 21 min  Problem List:  Patient Active Problem List   Diagnosis Date Noted  . TIA (transient ischemic attack) 04/30/2013  . 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 Abnormality of gait 04/18/2013  . Adjustment disorder with mixed anxiety and depressed mood 04/18/2013  . Fatigue 04/18/2013  . 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 CVA - Rt brain stroke 12/14 02/18/2013  . Memory change 01/24/2013  . Pruritic condition 01/24/2013  . Abnormal PFTs 10/30/2012  . Allergic rhinitis 09/13/2012  . Aortic aneurysm of unspecified site without mention of rupture 10/27/2011  . Nocturia 10/27/2011  . Pain in joint, lower leg 08/04/2011  . Long term (current) use of anticoagulants 03/17/2011  . Muscle weakness (generalized) 03/10/2011  . Hyponatremia 03/04/2011  . Atrial fibrillation 03/04/2011  . Fall 03/04/2011  . HTN (hypertension) 03/04/2011  . Hypothyroidism 03/04/2011  . Internal hemorrhoids without mention of complication 42/70/6237  . Cardiac pacemaker in situ- MDT 10/11 03/18/2010  . Basal cell carcinoma of skin of other and unspecified parts of face 12/24/2009  . Other specified cardiac dysrhythmias 12/24/2009  . Unspecified sinusitis (chronic) 07/01/2009  . Major depressive disorder, single episode, unspecified 03/05/2009  . Osteoarthrosis, unspecified whether generalized or localized, lower leg 01/19/2009  . Syncope and collapse 01/19/2009  . Obstructive sleep apnea (adult) (pediatric) 07/23/2008  . Unspecified vitamin D deficiency 10/18/2006  . Atherosclerosis of renal artery 10/18/2006  . Pain in joint, shoulder region 10/18/2006  . Right bundle branch block 04/19/2006  . Acute on chronic systolic congestive heart failure, NYHA class 2  03/16/2006  . Urinary frequency 03/16/2006  . Spinal stenosis in cervical region 12/15/2004  . Type II or unspecified type diabetes mellitus without mention of complication, not stated as uncontrolled 11/24/2004  . Dyslipidemia 11/03/2004  . Gout, unspecified 11/03/2004  . CABG '89, SVG-OM DES '08, low risk Nuc 2012 11/03/2004  . Hypertrophy of prostate with urinary obstruction and other lower urinary tract symptoms (LUTS) 04/28/2004  . Osteoarthrosis, unspecified whether generalized or localized, unspecified site 02/05/2003  . Impotence of organic origin 10/03/2000   Past Medical History:  Past Medical History  Diagnosis Date  . Hypertension   . Coronary artery disease   . Arthritis   . Cataract   . Anxiety   . Blood transfusion without reported diagnosis   . Hyperlipidemia   . Heart murmur   . Allergy   . Thyroid disease   . PAF (paroxysmal atrial fibrillation)     coumadin  . History of abdominal aortic aneurysm   . History of echocardiogram 12/2008    EF >55%; mild concentric LVH; mild MR; mild-mod TR; mild AV regurg;   . History of nuclear stress test 12/2010    lexiscan; low risk; compared to prior study, perfusion improved  . History of Doppler ultrasound 05/22/2012    LEAs; R anterior tibial artery appeared occluded; L posterior tibial shows short segment of occlusive ds  . History of Doppler ultrasound 05/22/2012    Abdominal Aortic Doppler; slight increase in fusiform aneurysm   . Stroke    Past Surgical History:  Past Surgical History  Procedure Laterality Date  . Coronary artery bypass graft  1989  . Pacemaker insertion  2011  . Insert / replace / remove pacemaker    . Eye surgery    .  Joint replacement    . Hernia repair    . Coronary angioplasty with stent placement  2008    stent to SVG to OM   HPI:  80 y.o. male has a list of atrial fibrillation and previous stroke in to the hospital because of right lower leg weakness. Patient woke up this morning  about 7 he went to the bathroom about 7:30 he developed right lower leg weakness which lasted for about 30 minutes. He denies any fainting, syncope, chest pain or shortness of breath. Denies any headache. Patient has atrial fibrillation which was being well controlled with amiodarone, he was taken off of a meal in August of 2014 secondary to abnormal PFTs. Cardiology consider starting Tikosyn.   Assessment / Plan / Recommendation Clinical Impression  Pleasant pt. exhibiting mild baseline dysarthria originating from CVA 02/2013.  He is presently receiving speech therapy at [facility] and feels he is almost at baseline regarding speech intelligibility.  Cognition appears within functional limits at this time.  Recommend he continue ST at SNF and no follow up while in acute care (possible discharge tomorrow).     SLP Assessment  All further Speech Lanaguage Pathology  needs can be addressed in the next venue of care    Follow Up Recommendations  Home health SLP    Frequency and Duration        Pertinent Vitals/Pain DL       SLP Evaluation Prior Functioning  Cognitive/Linguistic Baseline: Within functional limits   Cognition  Overall Cognitive Status: Within Functional Limits for tasks assessed    Comprehension  Auditory Comprehension Overall Auditory Comprehension: Appears within functional limits for tasks assessed Visual Recognition/Discrimination Discrimination: Not tested Reading Comprehension Reading Status: Not tested    Expression Expression Primary Mode of Expression: Verbal Verbal Expression Overall Verbal Expression: Appears within functional limits for tasks assessed Written Expression Dominant Hand: Right   Oral / Motor Oral Motor/Sensory Function Overall Oral Motor/Sensory Function: Impaired at baseline Labial ROM: Reduced right Labial Symmetry: Abnormal symmetry right Labial Strength: Reduced Lingual ROM: Within Functional Limits Lingual Symmetry:  Within Functional Limits Lingual Strength: Reduced Facial ROM: Reduced right Facial Symmetry: Right droop Velum: Within Functional Limits Mandible: Within Functional Limits Motor Speech Overall Motor Speech: Impaired Respiration: Within functional limits Phonation: Normal Resonance:  (questionable trace hyponasality?) Articulation: Within functional limitis Intelligibility: Intelligible (minimal distortion) Motor Planning: Witnin functional limits        Orbie Pyo [employer] [phone] 05/02/2013

## 2013-05-02 NOTE — Consult Note (Addendum)
Referring Physician: Dr. Eliseo Squires    Chief Complaint: transient right LE weakness.  HPI:                                                                                                                                         Jon [name] is an 80 y.o. male, right handed, with a past medical history significant for HTN, hyperlipidemia, CAD s/p CABG, paroxysmal atrial fibrillation s/p pacemaker insertion, s/p right CEA,ischemic stroke Dec 2014 with residual dysarthria and left face droop, and AAA, brought to [facility] 2/24 due to new onset right LE weakness. He is currently living by himself and said that he went to bed the night before feeling well, woke up the next morning doing great and knows that he was well until around 730 am 2/24 when he was preparing breakfast and suddenly noticed that he was not having control of his right leg. He said that he lost control of that leg for approximately 30 minutes. Denies weakness of the right arm, HA, vertigo, double vision, difficulty swallowing, confusion, or visual disturbances. Mr. Cork said that he talked to his daughters and was advised to come to the ED.  He was on coumadin until his stroke last December and was switched to apixaban 5 mg daily which he has been taking faithfully without reported side effects. Of note, he was also taking aspirin and this was discontinued during his current admission. CT brain upon arrival to the ED showed findings consistent with an area of subacute to chronic lacunar infarction left MCA distribution and a subsequent MRI-DWI demonstrated: " small acute/subacute infarct left corona radiata. No mass effect or hemorrhage. 2. Three superimposed punctate acute lacunar infarcts (right caudate  nucleus, left occipital pole, left left parietal lobe). These could reflect synchronous small vessel ischemia rather than a recent embolic event". MRA brain showed intracranial atherosclerosis and fusiform enlargement of the cavernous left ICA  up to 9 mm diameter as well as fusiform enlargement of the right ICA terminus, up to 6 mm diameter. Total cholesterol 117, triglycerides 79, HDL 30, LDL 71 At this moment, he offers no neurological complains.  Date last known well: 04/30/13 Time last known well: 730 am tPA Given: no, symptoms had resolved, patient on apixaban NIHSS: 2 ( but due to residual dysarthria and left face droopiness) MRS: 1  Past Medical History  Diagnosis Date  . Hypertension   . Coronary artery disease   . Arthritis   . Cataract   . Anxiety   . Blood transfusion without reported diagnosis   . Hyperlipidemia   . Heart murmur   . Allergy   . Thyroid disease   . PAF (paroxysmal atrial fibrillation)     coumadin  . History of abdominal aortic aneurysm   . History of echocardiogram 12/2008    EF >55%; mild concentric LVH; mild MR; mild-mod TR; mild AV regurg;   Marland Kitchen  History of nuclear stress test 12/2010    lexiscan; low risk; compared to prior study, perfusion improved  . History of Doppler ultrasound 05/22/2012    LEAs; R anterior tibial artery appeared occluded; L posterior tibial shows short segment of occlusive ds  . History of Doppler ultrasound 05/22/2012    Abdominal Aortic Doppler; slight increase in fusiform aneurysm   . Stroke     Past Surgical History  Procedure Laterality Date  . Coronary artery bypass graft  1989  . Pacemaker insertion  2011  . Insert / replace / remove pacemaker    . Eye surgery    . Joint replacement    . Hernia repair    . Coronary angioplasty with stent placement  2008    stent to SVG to OM    Family History  Problem Relation Age of Onset  . Diabetes Father   . Other Mother     PAD with amputations   Social History:  reports that he quit smoking about 46 years ago. His smoking use included Cigarettes. He has a 11.25 pack-year smoking history. He has never used smokeless tobacco. He reports that he does not drink alcohol or use illicit drugs.  Allergies:   Allergies  Allergen Reactions  . Caduet [Amlodipine-Atorvastatin] Other (See Comments)    Weakness   . Crestor [Rosuvastatin] Other (See Comments)    Muscle pain/weakness  . Cymbalta [Duloxetine Hcl] Other (See Comments)    Excessive sedation  . Lipitor [Atorvastatin Calcium] Other (See Comments)    Muscle pain/weakness  . Lisinopril Other (See Comments)    Doesn't remember reaction  . Simvastatin Other (See Comments)    Muscle pain/weakness    Medications:                                                                                                                           I have reviewed the patient's current medications.  ROS:                                                                                                                                       History obtained from the patient and chart review.  General ROS: negative for - chills, fatigue, fever, night sweats, weight gain or weight loss Psychological ROS: negative for - behavioral disorder, hallucinations, memory difficulties, mood swings or suicidal ideation Ophthalmic ROS: negative for -  blurry vision, double vision, or eye pain ENT ROS: negative for - epistaxis, nasal discharge, oral lesions, sore throat, tinnitus or vertigo Allergy and Immunology ROS: negative for - hives or itchy/watery eyes Hematological and Lymphatic ROS: negative for - bleeding problems, bruising or swollen lymph nodes Endocrine ROS: negative for - galactorrhea, hair pattern changes, polydipsia/polyuria or temperature intolerance Respiratory ROS: negative for - cough, hemoptysis, shortness of breath or wheezing Cardiovascular ROS: negative for - chest pain, dyspnea on exertion, edema Gastrointestinal ROS: negative for - abdominal pain, diarrhea, hematemesis, nausea/vomiting or stool incontinence Genito-Urinary ROS: negative for - dysuria, hematuria, incontinence or urinary frequency/urgency Musculoskeletal ROS: negative for - joint  swelling or muscular weakness Neurological ROS: as noted in HPI Dermatological ROS: negative for rash and skin lesion changes  Physical exam: pleasant male in no apparent distress. Blood pressure 186/72, pulse 71, temperature 97.5 F (36.4 C), temperature source Oral, resp. rate 20, SpO2 93.00%. Head: normocephalic. Neck: supple, no bruits, no JVD. Cardiac: no murmurs. Lungs: clear. Abdomen: soft, no tender, no mass. Extremities: no edema.  Neurologic Examination:                                                                                                      Mental Status: Alert, oriented, thought content appropriate.  Mild dysarthria without evidence of aphasia.  Able to follow 3 step commands without difficulty. Cranial Nerves: II: Discs flat bilaterally; Visual fields grossly normal, pupils asymmetric s/p surgery left eye, reactive to light  III,IV, VI: ptosis not present, extra-ocular motions intact bilaterally V,VII: smile symmetric, facial light touch sensation normal bilaterally VIII: hearing normal bilaterally IX,X: gag reflex present XI: bilateral shoulder shrug XII: tongue slightly deviated to the right without atrophy or fasciculations  Motor: Right : Upper extremity   5/5    Left:     Upper extremity   5/5  Lower extremity   5/5     Lower extremity   5/5 Tone and bulk:normal tone throughout; no atrophy noted Sensory: Pinprick and light touch intact throughout, bilaterally Deep Tendon Reflexes:  Right: Upper Extremity   Left: Upper extremity   biceps (C-5 to C-6) 2/4   biceps (C-5 to C-6) 2/4 tricep (C7) 2/4    triceps (C7) 2/4 Brachioradialis (C6) 2/4  Brachioradialis (C6) 2/4  Lower Extremity Lower Extremity  quadriceps (L-2 to L-4) 2/4   quadriceps (L-2 to L-4) 2/4 Achilles (S1) 2/4   Achilles (S1) 2/4  Plantars: Right: downgoing   Left: downgoing Cerebellar: normal finger-to-nose,  normal heel-to-shin test Gait:  No tested. CV: pulses palpable  throughout    Results for orders placed during the hospital encounter of 04/30/13 (from the past 48 hour(s))  PROTIME-INR     Status: Abnormal   Collection Time    04/30/13  2:24 PM      Result Value Ref Range   Prothrombin Time 17.2 (*) 11.6 - 15.2 seconds   INR 1.44  0.00 - 1.49  APTT     Status: None   Collection Time    04/30/13  2:24 PM  Result Value Ref Range   aPTT 37  24 - 37 seconds   Comment:            IF BASELINE aPTT IS ELEVATED,     SUGGEST PATIENT RISK ASSESSMENT     BE USED TO DETERMINE APPROPRIATE     ANTICOAGULANT THERAPY.  CBC     Status: None   Collection Time    04/30/13  2:24 PM      Result Value Ref Range   WBC 6.9  4.0 - 10.5 K/uL   RBC 4.80  4.22 - 5.81 MIL/uL   Hemoglobin 15.7  13.0 - 17.0 g/dL   HCT 44.6  39.0 - 52.0 %   MCV 92.9  78.0 - 100.0 fL   MCH 32.7  26.0 - 34.0 pg   MCHC 35.2  30.0 - 36.0 g/dL   RDW 13.4  11.5 - 15.5 %   Platelets 207  150 - 400 K/uL  DIFFERENTIAL     Status: Abnormal   Collection Time    04/30/13  2:24 PM      Result Value Ref Range   Neutrophils Relative % 65  43 - 77 %   Neutro Abs 4.5  1.7 - 7.7 K/uL   Lymphocytes Relative 20  12 - 46 %   Lymphs Abs 1.4  0.7 - 4.0 K/uL   Monocytes Relative 13 (*) 3 - 12 %   Monocytes Absolute 0.9  0.1 - 1.0 K/uL   Eosinophils Relative 1  0 - 5 %   Eosinophils Absolute 0.1  0.0 - 0.7 K/uL   Basophils Relative 0  0 - 1 %   Basophils Absolute 0.0  0.0 - 0.1 K/uL  COMPREHENSIVE METABOLIC PANEL     Status: Abnormal   Collection Time    04/30/13  2:24 PM      Result Value Ref Range   Sodium 141  137 - 147 mEq/L   Potassium 4.2  3.7 - 5.3 mEq/L   Chloride 104  96 - 112 mEq/L   CO2 24  19 - 32 mEq/L   Glucose, Bld 111 (*) 70 - 99 mg/dL   BUN 28 (*) 6 - 23 mg/dL   Creatinine, Ser 1.11  0.50 - 1.35 mg/dL   Calcium 9.0  8.4 - 10.5 mg/dL   Total Protein 6.4  6.0 - 8.3 g/dL   Albumin 3.3 (*) 3.5 - 5.2 g/dL   AST 21  0 - 37 U/L   ALT 16  0 - 53 U/L   Alkaline Phosphatase 81   39 - 117 U/L   Total Bilirubin 0.7  0.3 - 1.2 mg/dL   GFR calc non Af Amer 57 (*) >90 mL/min   GFR calc Af Amer 66 (*) >90 mL/min   Comment: (NOTE)     The eGFR has been calculated using the CKD EPI equation.     This calculation has not been validated in all clinical situations.     eGFR's persistently <90 mL/min signify possible Chronic Kidney     Disease.  TROPONIN I     Status: None   Collection Time    04/30/13  2:24 PM      Result Value Ref Range   Troponin I <0.30  <0.30 ng/mL   Comment:            Due to the release kinetics of cTnI,     a negative result within the first hours     of the  onset of symptoms does not rule out     myocardial infarction with certainty.     If myocardial infarction is still suspected,     repeat the test at appropriate intervals.  ETHANOL     Status: None   Collection Time    04/30/13  2:24 PM      Result Value Ref Range   Alcohol, Ethyl (B) <11  0 - 11 mg/dL   Comment:            LOWEST DETECTABLE LIMIT FOR     SERUM ALCOHOL IS 11 mg/dL     FOR MEDICAL PURPOSES ONLY  MAGNESIUM     Status: None   Collection Time    04/30/13  2:25 PM      Result Value Ref Range   Magnesium 2.2  1.5 - 2.5 mg/dL  URINALYSIS, ROUTINE W REFLEX MICROSCOPIC     Status: None   Collection Time    04/30/13  3:07 PM      Result Value Ref Range   Color, Urine YELLOW  YELLOW   APPearance CLEAR  CLEAR   Specific Gravity, Urine 1.013  1.005 - 1.030   pH 6.5  5.0 - 8.0   Glucose, UA NEGATIVE  NEGATIVE mg/dL   Hgb urine dipstick NEGATIVE  NEGATIVE   Bilirubin Urine NEGATIVE  NEGATIVE   Ketones, ur NEGATIVE  NEGATIVE mg/dL   Protein, ur NEGATIVE  NEGATIVE mg/dL   Urobilinogen, UA 0.2  0.0 - 1.0 mg/dL   Nitrite NEGATIVE  NEGATIVE   Leukocytes, UA NEGATIVE  NEGATIVE   Comment: MICROSCOPIC NOT DONE ON URINES WITH NEGATIVE PROTEIN, BLOOD, LEUKOCYTES, NITRITE, OR GLUCOSE <1000 mg/dL.  URINE RAPID DRUG SCREEN (HOSP PERFORMED)     Status: None   Collection Time     04/30/13  3:07 PM      Result Value Ref Range   Opiates NONE DETECTED  NONE DETECTED   Cocaine NONE DETECTED  NONE DETECTED   Benzodiazepines NONE DETECTED  NONE DETECTED   Amphetamines NONE DETECTED  NONE DETECTED   Tetrahydrocannabinol NONE DETECTED  NONE DETECTED   Barbiturates NONE DETECTED  NONE DETECTED   Comment:            DRUG SCREEN FOR MEDICAL PURPOSES     ONLY.  IF CONFIRMATION IS NEEDED     FOR ANY PURPOSE, NOTIFY LAB     WITHIN 5 DAYS.                LOWEST DETECTABLE LIMITS     FOR URINE DRUG SCREEN     Drug Class       Cutoff (ng/mL)     Amphetamine      1000     Barbiturate      200     Benzodiazepine   412     Tricyclics       878     Opiates          300     Cocaine          300     THC              50  HEMOGLOBIN A1C     Status: Abnormal   Collection Time    05/01/13  5:28 AM      Result Value Ref Range   Hemoglobin A1C 6.3 (*) <5.7 %   Comment: (NOTE)  According to the ADA Clinical Practice Recommendations for 2011, when     HbA1c is used as a screening test:      >=6.5%   Diagnostic of Diabetes Mellitus               (if abnormal result is confirmed)     5.7-6.4%   Increased risk of developing Diabetes Mellitus     References:Diagnosis and Classification of Diabetes Mellitus,Diabetes     ZDGU,4403,47(QQVZD 1):S62-S69 and Standards of Medical Care in             Diabetes - 2011,Diabetes GLOV,5643,32 (Suppl 1):S11-S61.   Mean Plasma Glucose 134 (*) <117 mg/dL   Comment: Performed at [facility]     Status: Abnormal   Collection Time    05/01/13  5:28 AM      Result Value Ref Range   Cholesterol 117  0 - 200 mg/dL   Triglycerides 79  <150 mg/dL   HDL 30 (*) >39 mg/dL   Total CHOL/HDL Ratio 3.9     VLDL 16  0 - 40 mg/dL   LDL Cholesterol 71  0 - 99 mg/dL   Comment:            Total Cholesterol/HDL:CHD Risk     Coronary Heart Disease Risk Table                          Men   Women      1/2 Average Risk   3.4   3.3      Average Risk       5.0   4.4      2 X Average Risk   9.6   7.1      3 X Average Risk  23.4   11.0                Use the calculated Patient Ratio     above and the CHD Risk Table     to determine the patient's CHD Risk.                ATP III CLASSIFICATION (LDL):      <100     mg/dL   Optimal      100-129  mg/dL   Near or Above                        Optimal      130-159  mg/dL   Borderline      160-189  mg/dL   High      >190     mg/dL   Very High   Dg Chest 2 View  04/30/2013   CLINICAL DATA:  Stroke.  Ex-smoker.  EXAM: CHEST  2 VIEW  COMPARISON:  02/19/2013.  FINDINGS: The cardiac silhouette remains borderline enlarged. Stable post CABG changes and left subclavian pacemaker leads. Clear lungs with normal vascularity. Minimal linear atelectasis at both lung bases. Diffuse osteopenia.  IMPRESSION: Minimal bibasilar linear atelectasis.   Electronically Signed   By: Enrique Sack M.D.   On: 04/30/2013 21:46   Ct Head Wo Contrast  04/30/2013   CLINICAL DATA:  TRANSIENT ISCHEMIC ATTACK  EXAM: CT HEAD WITHOUT CONTRAST  TECHNIQUE: Contiguous axial images were obtained from the base of the skull through the vertex without intravenous contrast.  COMPARISON:  CT HEAD W/O CM dated 02/19/2013; CT ANGIO HEAD W/CM &/OR WO/CM dated 02/19/2013  FINDINGS: Focal area of low attenuation projects adjacent to the apex of the left lateral ventricle. This finding has a central well defined focus of low attenuation and has appearance of region of subacute to chronic lacunar infarction. Global atrophy is appreciated. Diffuse areas of low attenuation project within the subcortical, deep, and periventricular white matter regions. There is no evidence of a depressed skull fracture. Visualized paranasal sinuses and mastoid air cells patent. There is no evidence of an extra-axial fluid collection nor acute hemorrhage. There is no evidence mass effect.  The visualized paranasal sinuses and mastoid air cells are patent. There is no evidence of a depressed skull fracture.  IMPRESSION: Findings consistent with an area of subacute to chronic lacunar infarction left MCA distribution. Involutional and chronic changes also appreciated. There is no evidence of acute abnormalities.   Electronically Signed   By: Margaree Mackintosh M.D.   On: 04/30/2013 15:06   Mr Brain Wo Contrast  05/01/2013   CLINICAL DATA:  80 year old male with previous stroke, right lower extremity weakness. Awoke with increased weakness today. History of atrial fibrillation, with [employer] pacemaker (monitored by [employer] representative and nursing staff for this exam with no complications observed). Initial encounter.  EXAM: MRI HEAD WITHOUT CONTRAST  MRA HEAD WITHOUT CONTRAST  TECHNIQUE: Multiplanar, multiecho pulse sequences of the brain and surrounding structures were obtained without intravenous contrast. Angiographic images of the head were obtained using MRA technique without contrast.  COMPARISON:  Head CTs 04/30/2013 and earlier.  FINDINGS: MRI HEAD FINDINGS  Round/oval 15 mm area of diffusion signal abnormality in the left posterior corona radiata extending toward the insula (series 4, image 16, series 3, image 21) does show mild decreased diffusion on ADC. Associated T2 and FLAIR hyperintensity without mass effect or hemorrhage.  Additionally there are 3 other punctate areas of restricted diffusion identified, 1 in the posterior left parietal lobe, 1 in the left occipital pole, and 1 in the contralateral right caudate nucleus (series 3 images 17 and).  Major intracranial vascular flow voids are preserved.  Patchy and confluent cerebral white matter T2 and FLAIR hyperintensity superimposed on the above findings. Chronic lacunar infarcts in the deep gray matter nuclei, most notably of the right thalamus. Chronic lacunar infarcts in the left cerebellar hemisphere. Mild T2 and FLAIR  hyperintensity in the pons. Occasional chronic micro hemorrhages in the brain (right thalamus series 10, image 13).  Visible internal auditory structures appear normal. Negative pituitary, cervicomedullary junction and visualized cervical spine. Normal bone marrow signal. No ventriculomegaly. No midline shift, mass effect, or evidence of intracranial mass lesion. Stable cerebral volume.  Postoperative changes to the globes. Visualized paranasal sinuses and mastoids are clear. Visualized scalp soft tissues are within normal limits.  MRA HEAD FINDINGS  Antegrade flow in the posterior circulation. Dominant distal left vertebral artery. Irregularity of the distal right vertebral artery suggesting tandem stenosis. Still, the right PICA origin remains patent. Left vertebral primarily supplies the basilar. Patent left PICA origin. Basilar artery irregularity but no definite stenosis. SCA and left PCA origins are normal. Fetal type right PCA origin. Normal left posterior communicating artery. Bilateral PCA branches within normal limits.  Antegrade flow in both carotid termini. Fusiform enlargement of the cavernous left ICA up to 9 mm diameter. There is also fusiform enlargement of the right ICA terminus, up to 6 mm diameter. Evidence of calcified ICA plaque bilaterally, but no definite ICA stenosis.  Patent carotid termini. Ophthalmic and posterior communicating artery origins are within normal limits. MCA  and ACA origins are patent. Dominant left ACA A1 segment. Anterior communicating artery and visualized ACA branches are within normal limits. Left MCA branches are within normal limits.  Irregularity suggesting calcified plaque at the right ICA origin, but mild if any stenosis. Right MCA M1 segment remains patent. Visualized right MCA branches grossly normal.  IMPRESSION: 1. Small acute/subacute infarct left corona radiata. No mass effect or hemorrhage. 2. Three superimposed punctate acute lacunar infarcts (right caudate  nucleus, left occipital pole, left left parietal lobe). These could reflect synchronous small vessel ischemia rather than a recent embolic event. 3. Underlying chronic small vessel ischemia. 4. Intracranial atherosclerosis. Fusiform aneurysmal enlargement of the left ICA (cavernous segment) and right ICA terminus. No anterior circulation major branch occlusion. 5. Atherosclerosis in stenosis of the distal right vertebral artery which appears hemodynamically significant. Other posterior circulation atherosclerosis without stenosis.   Electronically Signed   By: Lars Pinks M.D.   On: 05/01/2013 13:59   Mr Jodene Nam Head/brain Wo Cm  05/01/2013   CLINICAL DATA:  80 year old male with previous stroke, right lower extremity weakness. Awoke with increased weakness today. History of atrial fibrillation, with [employer] pacemaker (monitored by [employer] representative and nursing staff for this exam with no complications observed). Initial encounter.  EXAM: MRI HEAD WITHOUT CONTRAST  MRA HEAD WITHOUT CONTRAST  TECHNIQUE: Multiplanar, multiecho pulse sequences of the brain and surrounding structures were obtained without intravenous contrast. Angiographic images of the head were obtained using MRA technique without contrast.  COMPARISON:  Head CTs 04/30/2013 and earlier.  FINDINGS: MRI HEAD FINDINGS  Round/oval 15 mm area of diffusion signal abnormality in the left posterior corona radiata extending toward the insula (series 4, image 16, series 3, image 21) does show mild decreased diffusion on ADC. Associated T2 and FLAIR hyperintensity without mass effect or hemorrhage.  Additionally there are 3 other punctate areas of restricted diffusion identified, 1 in the posterior left parietal lobe, 1 in the left occipital pole, and 1 in the contralateral right caudate nucleus (series 3 images 17 and).  Major intracranial vascular flow voids are preserved.  Patchy and confluent cerebral white matter T2 and FLAIR hyperintensity  superimposed on the above findings. Chronic lacunar infarcts in the deep gray matter nuclei, most notably of the right thalamus. Chronic lacunar infarcts in the left cerebellar hemisphere. Mild T2 and FLAIR hyperintensity in the pons. Occasional chronic micro hemorrhages in the brain (right thalamus series 10, image 13).  Visible internal auditory structures appear normal. Negative pituitary, cervicomedullary junction and visualized cervical spine. Normal bone marrow signal. No ventriculomegaly. No midline shift, mass effect, or evidence of intracranial mass lesion. Stable cerebral volume.  Postoperative changes to the globes. Visualized paranasal sinuses and mastoids are clear. Visualized scalp soft tissues are within normal limits.  MRA HEAD FINDINGS  Antegrade flow in the posterior circulation. Dominant distal left vertebral artery. Irregularity of the distal right vertebral artery suggesting tandem stenosis. Still, the right PICA origin remains patent. Left vertebral primarily supplies the basilar. Patent left PICA origin. Basilar artery irregularity but no definite stenosis. SCA and left PCA origins are normal. Fetal type right PCA origin. Normal left posterior communicating artery. Bilateral PCA branches within normal limits.  Antegrade flow in both carotid termini. Fusiform enlargement of the cavernous left ICA up to 9 mm diameter. There is also fusiform enlargement of the right ICA terminus, up to 6 mm diameter. Evidence of calcified ICA plaque bilaterally, but no definite ICA stenosis.  Patent carotid termini. Ophthalmic and posterior  communicating artery origins are within normal limits. MCA and ACA origins are patent. Dominant left ACA A1 segment. Anterior communicating artery and visualized ACA branches are within normal limits. Left MCA branches are within normal limits.  Irregularity suggesting calcified plaque at the right ICA origin, but mild if any stenosis. Right MCA M1 segment remains patent.  Visualized right MCA branches grossly normal.  IMPRESSION: 1. Small acute/subacute infarct left corona radiata. No mass effect or hemorrhage. 2. Three superimposed punctate acute lacunar infarcts (right caudate nucleus, left occipital pole, left left parietal lobe). These could reflect synchronous small vessel ischemia rather than a recent embolic event. 3. Underlying chronic small vessel ischemia. 4. Intracranial atherosclerosis. Fusiform aneurysmal enlargement of the left ICA (cavernous segment) and right ICA terminus. No anterior circulation major branch occlusion. 5. Atherosclerosis in stenosis of the distal right vertebral artery which appears hemodynamically significant. Other posterior circulation atherosclerosis without stenosis.   Electronically Signed   By: Lars Pinks M.D.   On: 05/01/2013 13:59     Assessment: 80 y.o. male with several risk factors for stroke, admitted with new onset transient right LE weakness. MRI brain: small acute/subacute infarct left corona radiata and possible punctuate acute lacunar infarcts (right caudate  nucleus, left occipital pole, left left parietal lobe). No a candidate for thrombolysis due to resolution of his symptoms and being on apixaban. Patient cerebrovascular insult occurred while on low dose 5 mg apixaban and this dose was already increased to 5 mg BID at the time of admission to the hospital. He had comprehensive stroke work up Dec/2014. Suspect this stroke is most likely due to cardio-embolism in the context of atrial fibrillation. Continue apixaban 5 mg BID. Stroke team will resume care in the morning and make further recommendations.     Stroke Risk Factors - age, HTN, hyperlipidemia, CAD, paroxysmal atrial fibrillation, prior stroke   Plan: 1. HgbA1c, fasting lipid panel (done) 2. MRI, MRA  of the brain without contrast (done) 3. Echocardiogram (done 12/14) 4. Carotid dopplers (done 12/14) 5. Prophylactic therapy-apixaban 5 mg BID 6. Risk  factor modification 7. Telemetry monitoring 8. Frequent neuro checks 9. PT/OT SLP (no need at this moment)   Dorian Pod, MD [facility] [phone]  05/02/2013, 11:19 AM

## 2013-05-03 DIAGNOSIS — I6350 Cerebral infarction due to unspecified occlusion or stenosis of unspecified cerebral artery: Secondary | ICD-10-CM | POA: Diagnosis not present

## 2013-05-03 DIAGNOSIS — I4891 Unspecified atrial fibrillation: Secondary | ICD-10-CM | POA: Diagnosis not present

## 2013-05-03 DIAGNOSIS — I509 Heart failure, unspecified: Secondary | ICD-10-CM | POA: Diagnosis not present

## 2013-05-03 DIAGNOSIS — I10 Essential (primary) hypertension: Secondary | ICD-10-CM | POA: Diagnosis not present

## 2013-05-03 DIAGNOSIS — Z7901 Long term (current) use of anticoagulants: Secondary | ICD-10-CM | POA: Diagnosis not present

## 2013-05-03 DIAGNOSIS — I5023 Acute on chronic systolic (congestive) heart failure: Secondary | ICD-10-CM | POA: Diagnosis not present

## 2013-05-03 DIAGNOSIS — R269 Unspecified abnormalities of gait and mobility: Secondary | ICD-10-CM | POA: Diagnosis not present

## 2013-05-03 MED ORDER — LORAZEPAM 0.5 MG PO TABS: ORAL_TABLET | ORAL | Status: DC

## 2013-05-03 MED ORDER — METOPROLOL TARTRATE 25 MG PO TABS: 75.0000 mg | ORAL_TABLET | Freq: Two times a day (BID) | ORAL | Status: DC

## 2013-05-03 MED ORDER — APIXABAN 5 MG PO TABS: 5.0000 mg | ORAL_TABLET | Freq: Two times a day (BID) | ORAL | Status: DC

## 2013-05-03 NOTE — Social Work (Signed)
Patient for d/c today to ALF bed at [facility]. Family and patient agreeable to this plan- son to transport.Eduard Clos, MSW, [place]

## 2013-05-03 NOTE — Discharge Summary (Addendum)
Physician Discharge Summary  Jon [name] [id number] DOB: April 14, 1921 DOA: 04/30/2013  PCP: [name], MD  Admit date: 04/30/2013 Discharge date: 05/03/2013  Time spent: greater than 30 minutes  Discharge Diagnoses:  Principal Problem:   Acute ischemic stroke Active Problems:   Atrial fibrillation   HTN (hypertension)   Long term (current) use of anticoagulants   Cardiac pacemaker in situ- MDT 10/11   Right bundle branch block   Type II or unspecified type diabetes mellitus without mention of complication, not stated as uncontrolled   Dyslipidemia   CABG '89, SVG-OM DES '08, low risk Nuc 2012   CVA - Rt brain stroke 12/14   Discharge Condition: stable  History of present illness/hospital course: 80 y.o. male, right handed, with a past medical history significant for HTN, hyperlipidemia, CAD s/p CABG, paroxysmal atrial fibrillation s/p pacemaker insertion, s/p right CEA,ischemic stroke Dec 2014 with residual dysarthria and left face droop, and AAA, brought to [facility] 2/24 due to new onset right LE weakness.  He is currently living by himself and said that he went to bed the night before feeling well, woke up the next morning doing great and knows that he was well until around 730 am 2/24 when he was preparing breakfast and suddenly noticed that he was not having control of his right leg. He said that he lost control of that leg for approximately 30 minutes. Denies weakness of the right arm, HA, vertigo, double vision, difficulty swallowing, confusion, or visual disturbances.  [name] said that he talked to his daughters and was advised to come to the ED.  He was on coumadin until his stroke last December and was switched to apixaban 2.5 mg daily which he has been taking faithfully without reported side effects. CT brain upon arrival to the ED showed findings consistent with an area of subacute to chronic lacunar infarction left MCA distribution and a subsequent MRI-DWI  demonstrated: " small acute/subacute infarct left corona radiata. Jon mass effect or hemorrhage. 2. Three superimposed punctate acute lacunar infarcts (right caudate  nucleus, left occipital pole, left left parietal lobe). These could reflect synchronous small vessel ischemia rather than a recent embolic event".  MRA brain showed intracranial atherosclerosis and fusiform enlargement of the cavernous left ICA up to 9 mm diameter as well as fusiform enlargement of the right ICA terminus, up to 6 mm diameter.  Total cholesterol 117, triglycerides 79, HDL 30, LDL 71  Echo and carotids done at last hospitalization.  Neurology and cardiology consulted. Liquids was increased to 5 mg twice daily. Aspirin was stopped. Metoprolol was increased from 50 mg twice a day to 75 mg twice a day for better blood pressure control. Patient had resolution of his symptoms. Cardiology plans to admit patient in 4-6 weeks for a Tikosyn load. He is being transferred to assisted-living facility.  Procedures:  None  Consultations:  Neurology  Cardiology  Discharge Exam: Filed Vitals:   05/03/13 0953  BP: 150/80  Pulse: 67  Temp: 97.8 F (36.6 C)  Resp: 20    General: Alert, oriented. Walking around the unit with a walker on his and Jon difficulty. Cardiovascular: Irregularly irregular Respiratory: Clear to auscultation bilaterally without wheezes rhonchi or rales extremities: Jon clubbing cyanosis or edema Neurologic: Cranial nerves motor strength intact  Discharge Instructions  Discharge Orders   Future Appointments Provider Department Dept Phone   05/07/2013 2:15 PM Garvin Fila, MD [facility] [phone]   07/26/2013 8:00 AM [facility]  [place] Office [phone]   08/15/2013 3:45 PM [name], MD [place] [phone]   02/26/2014 12:45 PM [name], MD [place] [phone]   Future Orders Complete By  Expires   Diet - low sodium heart healthy  As directed    Walker   As directed        Medication List    STOP taking these medications       amoxicillin 500 MG tablet  Commonly known as:  AMOXIL     aspirin EC 81 MG tablet     CERAVE EX      TAKE these medications       allopurinol 100 MG tablet  Commonly known as:  ZYLOPRIM  Take 50 mg by mouth daily.     apixaban 5 MG Tabs tablet  Commonly known as:  ELIQUIS  Take 1 tablet (5 mg total) by mouth 2 (two) times daily.     eucerin cream  Apply 1 application topically daily as needed for dry skin (itching).     furosemide 20 MG tablet  Commonly known as:  LASIX  Take 1 tablet (20 mg total) by mouth daily.     hydrOXYzine 10 MG tablet  Commonly known as:  ATARAX/VISTARIL  Take 10 mg by mouth 4 (four) times daily -  with meals and at bedtime.     ipratropium 0.03 % nasal spray  Commonly known as:  ATROVENT  Place 1 spray into both nostrils 3 (three) times daily as needed (post nasal drip).     levothyroxine 100 MCG tablet  Commonly known as:  SYNTHROID, LEVOTHROID  Take 100 mcg by mouth daily before breakfast.     LORazepam 0.5 MG tablet  Commonly known as:  ATIVAN  qhs prn sleep     losartan 100 MG tablet  Commonly known as:  COZAAR  Take 1 tablet (100 mg total) by mouth daily.     metoprolol tartrate 25 MG tablet  Commonly known as:  LOPRESSOR  Take 3 tablets (75 mg total) by mouth 2 (two) times daily.     nitroGLYCERIN 0.4 MG SL tablet  Commonly known as:  NITROSTAT  Place 0.4 mg under the tongue every 5 (five) minutes as needed for chest pain.     polyvinyl alcohol 1.4 % ophthalmic solution  Commonly known as:  LIQUIFILM TEARS  Place 1 drop into both eyes 4 (four) times daily as needed for dry eyes.     potassium chloride 10 MEQ CR capsule  Commonly known as:  MICRO-K  Take 10 mEq by mouth 2 (two) times daily.     Vitamin D 400 UNITS capsule  Take 400 Units by mouth 2 (two) times daily.      zolpidem 10 MG tablet  Commonly known as:  AMBIEN  Take 2.5-5 mg by mouth at bedtime as needed for sleep.       Allergies  Allergen Reactions  . Caduet [Amlodipine-Atorvastatin] Other (See Comments)    Weakness   . Crestor [Rosuvastatin] Other (See Comments)    Muscle pain/weakness  . Cymbalta [Duloxetine Hcl] Other (See Comments)    Excessive sedation  . Lipitor [Atorvastatin Calcium] Other (See Comments)    Muscle pain/weakness  . Lisinopril Other (See Comments)    Doesn't remember reaction  . Simvastatin Other (See Comments)    Muscle pain/weakness       Follow-up Information   Follow up with Sanda Klein, MD.  Specialty:  Cardiology   Contact information:   [address] [place] [address] [phone]       Follow up with Pixie Casino, MD. (office will call you)    Specialty:  Cardiology   Contact information:   [place] [address] [phone]       Follow up with GREEN, Viviann Spare, MD In 1 week.   Specialty:  Internal Medicine   Contact information:   [place] [address] [phone]       Follow up with Forbes Cellar, MD In 2 months.   Specialties:  Neurology, Radiology   Contact information:   [address] [place] [address] [phone]        The results of significant diagnostics from this hospitalization (including imaging, microbiology, ancillary and laboratory) are listed below for reference.    Significant Diagnostic Studies: Dg Chest 2 View  04/30/2013   CLINICAL DATA:  Stroke.  Ex-smoker.  EXAM: CHEST  2 VIEW  COMPARISON:  02/19/2013.  FINDINGS: The cardiac silhouette remains borderline enlarged. Stable post CABG changes and left subclavian pacemaker leads. Clear lungs with normal vascularity. Minimal linear atelectasis at both lung bases. Diffuse osteopenia.  IMPRESSION: Minimal bibasilar linear atelectasis.   Electronically Signed   By: Enrique Sack M.D.   On: 04/30/2013 21:46   Ct Head Wo Contrast  04/30/2013   CLINICAL DATA:  TRANSIENT ISCHEMIC ATTACK  EXAM: CT HEAD WITHOUT CONTRAST  TECHNIQUE: Contiguous axial images were obtained from the base of the skull through the vertex without intravenous contrast.  COMPARISON:  CT HEAD W/O CM dated 02/19/2013; CT ANGIO HEAD W/CM &/OR WO/CM dated 02/19/2013  FINDINGS: Focal area of low attenuation projects adjacent to the apex of the left lateral ventricle. This finding has a central well defined focus of low attenuation and has appearance of region of subacute to chronic lacunar infarction. Global atrophy is appreciated. Diffuse areas of low attenuation project within the subcortical, deep, and periventricular white matter regions. There is Jon evidence of a depressed skull fracture. Visualized paranasal sinuses and mastoid air cells patent. There is Jon evidence of an extra-axial fluid collection nor acute hemorrhage. There is Jon evidence mass effect. The visualized paranasal sinuses and mastoid air cells are patent. There is Jon evidence of a depressed skull fracture.  IMPRESSION: Findings consistent with an area of subacute to chronic lacunar infarction left MCA distribution. Involutional and chronic changes also appreciated. There is Jon evidence of acute abnormalities.   Electronically Signed   By: Margaree Mackintosh M.D.   On: 04/30/2013 15:06   Mr Brain Wo Contrast  05/01/2013   CLINICAL DATA:  80 year old male with previous stroke, right lower extremity weakness. Awoke with increased weakness today. History of atrial fibrillation, with [employer] pacemaker (monitored by [employer] representative and nursing staff for this exam with Jon complications observed). Initial encounter.  EXAM: MRI HEAD WITHOUT CONTRAST  MRA HEAD WITHOUT CONTRAST  TECHNIQUE: Multiplanar, multiecho pulse sequences of the brain and surrounding structures were obtained without intravenous contrast. Angiographic images of the head were  obtained using MRA technique without contrast.  COMPARISON:  Head CTs 04/30/2013 and earlier.  FINDINGS: MRI HEAD FINDINGS  Round/oval 15 mm area of diffusion signal abnormality in the left posterior corona radiata extending toward the insula (series 4, image 16, series 3, image 21) does show mild decreased diffusion on ADC. Associated T2 and FLAIR hyperintensity without mass effect or hemorrhage.  Additionally there are 3 other punctate areas of restricted diffusion identified, 1 in the posterior left parietal lobe, 1 in the left occipital pole, and 1 in the contralateral right caudate nucleus (series 3 images 17 and).  Major intracranial vascular flow voids are preserved.  Patchy and confluent cerebral white matter T2 and FLAIR hyperintensity superimposed on the above findings. Chronic lacunar infarcts in the deep gray matter nuclei, most notably of the right thalamus. Chronic lacunar infarcts in the left cerebellar hemisphere. Mild T2 and FLAIR hyperintensity in the pons. Occasional chronic micro hemorrhages in the brain (right thalamus series 10, image 13).  Visible internal auditory structures appear normal. Negative pituitary, cervicomedullary junction and visualized cervical spine. Normal bone marrow signal. Jon ventriculomegaly. Jon midline shift, mass effect, or evidence of intracranial mass lesion. Stable cerebral volume.  Postoperative changes to the globes. Visualized paranasal sinuses and mastoids are clear. Visualized scalp soft tissues are within normal limits.  MRA HEAD FINDINGS  Antegrade flow in the posterior circulation. Dominant distal left vertebral artery. Irregularity of the distal right vertebral artery suggesting tandem stenosis. Still, the right PICA origin remains patent. Left vertebral primarily supplies the basilar. Patent left PICA origin. Basilar artery irregularity but Jon definite stenosis. SCA and left PCA origins are normal. Fetal type right PCA origin. Normal left posterior  communicating artery. Bilateral PCA branches within normal limits.  Antegrade flow in both carotid termini. Fusiform enlargement of the cavernous left ICA up to 9 mm diameter. There is also fusiform enlargement of the right ICA terminus, up to 6 mm diameter. Evidence of calcified ICA plaque bilaterally, but Jon definite ICA stenosis.  Patent carotid termini. Ophthalmic and posterior communicating artery origins are within normal limits. MCA and ACA origins are patent. Dominant left ACA A1 segment. Anterior communicating artery and visualized ACA branches are within normal limits. Left MCA branches are within normal limits.  Irregularity suggesting calcified plaque at the right ICA origin, but mild if any stenosis. Right MCA M1 segment remains patent. Visualized right MCA branches grossly normal.  IMPRESSION: 1. Small acute/subacute infarct left corona radiata. Jon mass effect or hemorrhage. 2. Three superimposed punctate acute lacunar infarcts (right caudate nucleus, left occipital pole, left left parietal lobe). These could reflect synchronous small vessel ischemia rather than a recent embolic event. 3. Underlying chronic small vessel ischemia. 4. Intracranial atherosclerosis. Fusiform aneurysmal enlargement of the left ICA (cavernous segment) and right ICA terminus. Jon anterior circulation major branch occlusion. 5. Atherosclerosis in stenosis of the distal right vertebral artery which appears hemodynamically significant. Other posterior circulation atherosclerosis without stenosis.   Electronically Signed   By: Lars Pinks M.D.   On: 05/01/2013 13:59   Mr Jodene Nam Head/brain Wo Cm  05/01/2013   CLINICAL DATA:  80 year old male with previous stroke, right lower extremity weakness. Awoke with increased weakness today. History of atrial fibrillation, with [employer] pacemaker (monitored by [employer] representative and nursing staff for this exam with Jon complications observed). Initial encounter.  EXAM: MRI HEAD WITHOUT  CONTRAST  MRA HEAD WITHOUT CONTRAST  TECHNIQUE: Multiplanar, multiecho pulse sequences of the brain and surrounding structures were obtained without intravenous contrast. Angiographic images of the head were obtained using MRA technique without contrast.  COMPARISON:  Head CTs 04/30/2013 and earlier.  FINDINGS: MRI HEAD FINDINGS  Round/oval 15 mm area of diffusion signal abnormality in the left posterior corona radiata extending toward the insula (series 4, image 16, series 3, image 21) does show mild decreased diffusion on ADC. Associated T2 and  FLAIR hyperintensity without mass effect or hemorrhage.  Additionally there are 3 other punctate areas of restricted diffusion identified, 1 in the posterior left parietal lobe, 1 in the left occipital pole, and 1 in the contralateral right caudate nucleus (series 3 images 17 and).  Major intracranial vascular flow voids are preserved.  Patchy and confluent cerebral white matter T2 and FLAIR hyperintensity superimposed on the above findings. Chronic lacunar infarcts in the deep gray matter nuclei, most notably of the right thalamus. Chronic lacunar infarcts in the left cerebellar hemisphere. Mild T2 and FLAIR hyperintensity in the pons. Occasional chronic micro hemorrhages in the brain (right thalamus series 10, image 13).  Visible internal auditory structures appear normal. Negative pituitary, cervicomedullary junction and visualized cervical spine. Normal bone marrow signal. Jon ventriculomegaly. Jon midline shift, mass effect, or evidence of intracranial mass lesion. Stable cerebral volume.  Postoperative changes to the globes. Visualized paranasal sinuses and mastoids are clear. Visualized scalp soft tissues are within normal limits.  MRA HEAD FINDINGS  Antegrade flow in the posterior circulation. Dominant distal left vertebral artery. Irregularity of the distal right vertebral artery suggesting tandem stenosis. Still, the right PICA origin remains patent. Left vertebral  primarily supplies the basilar. Patent left PICA origin. Basilar artery irregularity but Jon definite stenosis. SCA and left PCA origins are normal. Fetal type right PCA origin. Normal left posterior communicating artery. Bilateral PCA branches within normal limits.  Antegrade flow in both carotid termini. Fusiform enlargement of the cavernous left ICA up to 9 mm diameter. There is also fusiform enlargement of the right ICA terminus, up to 6 mm diameter. Evidence of calcified ICA plaque bilaterally, but Jon definite ICA stenosis.  Patent carotid termini. Ophthalmic and posterior communicating artery origins are within normal limits. MCA and ACA origins are patent. Dominant left ACA A1 segment. Anterior communicating artery and visualized ACA branches are within normal limits. Left MCA branches are within normal limits.  Irregularity suggesting calcified plaque at the right ICA origin, but mild if any stenosis. Right MCA M1 segment remains patent. Visualized right MCA branches grossly normal.  IMPRESSION: 1. Small acute/subacute infarct left corona radiata. Jon mass effect or hemorrhage. 2. Three superimposed punctate acute lacunar infarcts (right caudate nucleus, left occipital pole, left left parietal lobe). These could reflect synchronous small vessel ischemia rather than a recent embolic event. 3. Underlying chronic small vessel ischemia. 4. Intracranial atherosclerosis. Fusiform aneurysmal enlargement of the left ICA (cavernous segment) and right ICA terminus. Jon anterior circulation major branch occlusion. 5. Atherosclerosis in stenosis of the distal right vertebral artery which appears hemodynamically significant. Other posterior circulation atherosclerosis without stenosis.   Electronically Signed   By: Lars Pinks M.D.   On: 05/01/2013 13:59   EKG Atrial fibrillation Right bundle branch block ST depr, consider ischemia, anterolateral lds Baseline wander in lead(s) II III aVR aVL aVF V3  Microbiology: Jon  results found for this or any previous visit (from the past 240 hour(s)).   Labs: Basic Metabolic Panel:  Recent Labs Lab 04/30/13 1424 04/30/13 1425  NA 141  --   K 4.2  --   CL 104  --   CO2 24  --   GLUCOSE 111*  --   BUN 28*  --   CREATININE 1.11  --   CALCIUM 9.0  --   MG  --  2.2   Liver Function Tests:  Recent Labs Lab 04/30/13 1424  AST 21  ALT 16  ALKPHOS 81  BILITOT 0.7  PROT 6.4  ALBUMIN 3.3*   Jon results found for this basename: LIPASE, AMYLASE,  in the last 168 hours Jon results found for this basename: AMMONIA,  in the last 168 hours CBC:  Recent Labs Lab 04/30/13 1424  WBC 6.9  NEUTROABS 4.5  HGB 15.7  HCT 44.6  MCV 92.9  PLT 207   Cardiac Enzymes:  Recent Labs Lab 04/30/13 1424  TROPONINI <0.30   BNP: BNP (last 3 results) Jon results found for this basename: PROBNP,  in the last 8760 hours CBG: Jon results found for this basename: GLUCAP,  in the last 168 hours     Signed:  Malin L  [facility] 05/03/2013, 12:04 PM

## 2013-05-03 NOTE — Progress Notes (Signed)
D/C orders received. Pt and family notified. Verbalized understanding. Pt taken downstairs by staff via wheelchair. Pt being transported to [facility] by son.

## 2013-05-03 NOTE — Progress Notes (Signed)
    Subjective:  No complaints  Objective:  Vital Signs in the last 24 hours: Temp:  [97.5 F (36.4 C)-98.4 F (36.9 C)] 97.7 F (36.5 C) (02/27 0603) Pulse Rate:  [64-71] 64 (02/27 0603) Resp:  [18-20] 20 (02/27 0603) BP: (125-190)/(65-96) 149/89 mmHg (02/27 0603) SpO2:  [93 %-100 %] 97 % (02/27 0603)  Intake/Output from previous day:  Intake/Output Summary (Last 24 hours) at 05/03/13 0830 Last data filed at 05/02/13 1800  Gross per 24 hour  Intake    240 ml  Output      0 ml  Net    240 ml    Physical Exam: General appearance: alert, cooperative and no distress Lungs: few crackles Rt base Heart: irregularly irregular rhythm   Rate: 65  Rhythm: atrial fibrillation  Lab Results:  Recent Labs  04/30/13 1424  WBC 6.9  HGB 15.7  PLT 207    Recent Labs  04/30/13 1424  NA 141  K 4.2  CL 104  CO2 24  GLUCOSE 111*  BUN 28*  CREATININE 1.11    Recent Labs  04/30/13 1424  TROPONINI <0.30    Recent Labs  04/30/13 1424  INR 1.44    Imaging: Imaging results have been reviewed  Cardiac Studies:  Assessment/Plan:   Principal Problem:   TIA (transient ischemic attack) Active Problems:   Atrial fibrillation   CABG '89, SVG-OM DES '08, low risk Nuc 2012   CVA - Rt brain stroke 12/14   Fatigue   HTN (hypertension)   Long term (current) use of anticoagulants   Cardiac pacemaker in situ- MDT 10/11   Right bundle branch block   Type II or unspecified type diabetes mellitus without mention of complication, not stated as uncontrolled   Dyslipidemia   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 Plan is for admission for Tikosyn load in 4 weeks, then DCCV, His Lopressor was increased secondary to HTN and his B/P is better this am. He is now in the assisted living section of [facility]. I will arrange follow up with Dr Debara Pickett as an OP.   Kerin Ransom PA-C [facility]  [phone] 05/03/2013, 8:30 AM

## 2013-05-03 NOTE — Progress Notes (Signed)
Stroke Team Progress Note  HISTORY Jon [name] is an 80 y.o. male, right handed, with a past medical history significant for HTN, hyperlipidemia, CAD s/p CABG, paroxysmal atrial fibrillation s/p pacemaker insertion, s/p right CEA,ischemic stroke Dec 2014 with residual dysarthria and left face droop, and AAA, brought to [facility] 2/24 due to new onset right LE weakness.   He is currently living by himself and said that he went to bed the night before feeling well, woke up the next morning doing great and knows that he was well until around 730 am 2/24 when he was preparing breakfast and suddenly noticed that he was not having control of his right leg. He said that he lost control of that leg for approximately 30 minutes. Denies weakness of the right arm, HA, vertigo, double vision, difficulty swallowing, confusion, or visual disturbances.  [name] said that he talked to his daughters and was advised to come to the ED.   He was on coumadin until his stroke last December and was switched to apixaban 5 mg daily which he has been taking faithfully without reported side effects. Of note, he was also taking aspirin and this was discontinued during his current admission.   CT brain upon arrival to the ED showed findings consistent with an area of subacute to chronic lacunar infarction left MCA distribution and a subsequent MRI-DWI demonstrated: " small acute/subacute infarct left corona radiata. No mass effect or hemorrhage. 2. Three superimposed punctate acute lacunar infarcts (right caudate  nucleus, left occipital pole, left left parietal lobe). These could reflect synchronous small vessel ischemia rather than a recent embolic event".  MRA brain showed intracranial atherosclerosis and fusiform enlargement of the cavernous left ICA up to 9 mm diameter as well as fusiform enlargement of the right ICA terminus, up to 6 mm diameter.  Total cholesterol 117, triglycerides 79, HDL 30, LDL 71  At this moment, he  offers no neurological complains.   NIHSS: 2 ( but due to residual dysarthria and left face droopiness)   Patient was not administerd TPA secondary to symptoms had resolved, patient on apixaban. He was admitted to the neuro floor for further evaluation and treatment.  SUBJECTIVE His family is not  at the bedside.  Overall he feels his condition is rapidly improving.    OBJECTIVE Most recent Vital Signs: Filed Vitals:   05/02/13 1404 05/02/13 2100 05/03/13 0223 05/03/13 0603  BP: 125/65 134/76 173/76 149/89  Pulse: 66 66 65 64  Temp: 98 F (36.7 C) 98.4 F (36.9 C) 98.3 F (36.8 C) 97.7 F (36.5 C)  TempSrc: Oral Oral Oral Oral  Resp: 18 20 20 20   SpO2: 100% 94% 94% 97%   CBG (last 3)  No results found for this basename: GLUCAP,  in the last 72 hours  IV Fluid Intake:     MEDICATIONS  . allopurinol  50 mg Oral Daily  . apixaban  5 mg Oral BID  . furosemide  20 mg Oral Daily  . hydrOXYzine  10 mg Oral TID WC & HS  . levothyroxine  100 mcg Oral QAC breakfast  . losartan  100 mg Oral Daily  . metoprolol  75 mg Oral BID   PRN:  ipratropium, polyvinyl alcohol, zolpidem  Diet:  Cardiac thin liquids Activity:   DVT Prophylaxis:  apixaban  CLINICALLY SIGNIFICANT STUDIES Basic Metabolic Panel:  Recent Labs Lab 04/30/13 1424 04/30/13 1425  NA 141  --   K 4.2  --   CL 104  --  CO2 24  --   GLUCOSE 111*  --   BUN 28*  --   CREATININE 1.11  --   CALCIUM 9.0  --   MG  --  2.2   Liver Function Tests:  Recent Labs Lab 04/30/13 1424  AST 21  ALT 16  ALKPHOS 81  BILITOT 0.7  PROT 6.4  ALBUMIN 3.3*   CBC:  Recent Labs Lab 04/30/13 1424  WBC 6.9  NEUTROABS 4.5  HGB 15.7  HCT 44.6  MCV 92.9  PLT 207   Coagulation:  Recent Labs Lab 04/30/13 1424  LABPROT 17.2*  INR 1.44   Cardiac Enzymes:  Recent Labs Lab 04/30/13 1424  TROPONINI <0.30   Urinalysis:  Recent Labs Lab 04/30/13 1507  COLORURINE YELLOW  LABSPEC 1.013  PHURINE 6.5  GLUCOSEU  NEGATIVE  HGBUR NEGATIVE  BILIRUBINUR NEGATIVE  KETONESUR NEGATIVE  PROTEINUR NEGATIVE  UROBILINOGEN 0.2  NITRITE NEGATIVE  LEUKOCYTESUR NEGATIVE   Lipid Panel    Component Value Date/Time   CHOL 117 05/01/2013 0528   TRIG 79 05/01/2013 0528   HDL 30* 05/01/2013 0528   CHOLHDL 3.9 05/01/2013 0528   VLDL 16 05/01/2013 0528   LDLCALC 71 05/01/2013 0528   HgbA1C  Lab Results  Component Value Date   HGBA1C 6.3* 05/01/2013    Urine Drug Screen:     Component Value Date/Time   LABOPIA NONE DETECTED 04/30/2013 1507   COCAINSCRNUR NONE DETECTED 04/30/2013 1507   LABBENZ NONE DETECTED 04/30/2013 1507   AMPHETMU NONE DETECTED 04/30/2013 1507   THCU NONE DETECTED 04/30/2013 1507   LABBARB NONE DETECTED 04/30/2013 1507    Alcohol Level:  Recent Labs Lab 04/30/13 1424  ETH <11    CT of the brain  04/30/2013 Findings consistent with an area of subacute to chronic lacunar infarction left MCA distribution. Involutional and chronic changes also appreciated. There is no evidence of acute abnormalities.   MRI of the brain  05/01/2013   1. Small acute/subacute infarct left corona radiata. No mass effect or hemorrhage. 2. Three superimposed punctate acute lacunar infarcts (right caudate nucleus, left occipital pole, left left parietal lobe). These could reflect synchronous small vessel ischemia rather than a recent embolic event. 3. Underlying chronic small vessel ischemia.   MRA of the brain  05/01/2013   1. Intracranial atherosclerosis. Fusiform aneurysmal enlargement of the left ICA (cavernous segment) and right ICA terminus. No anterior circulation major branch occlusion. 2. Atherosclerosis in stenosis of the distal right vertebral artery which appears hemodynamically significant. Other posterior circulation atherosclerosis without stenosis.     2D Echocardiogram  02/20/2013 EF 40-45% with no source of embolus. Moderate LVH. Mild AR.   Carotid Doppler  02/19/2013 No evidence of hemodynamically  significant internal carotid artery stenosis. Vertebral artery flow is antegrade.   CXR  04/30/2013 Minimal bibasilar linear atelectasis.  EKG  atrial fibrillation, rate 69, RBBB. For complete results please see formal report.   Therapy Recommendations no PT, OP ST,    Physical Exam  Pleasant elderly Caucasian male not in distress.Awake alert. Afebrile. Head is nontraumatic. Neck is supple without bruit. Hearing is normal. Cardiac exam no murmur or gallop. Lungs are clear to auscultation. Distal pulses are well felt. Neurological Exam ;  Awake  Alert oriented x 3. Normal speech and language.eye movements full without nystagmus.fundi were not visualized. Vision acuity and fields appear normal. Hearing is normal. Palatal movements are normal. Face symmetric. Tongue midline. Normal strength, tone, reflexes and coordination. Normal sensation. Gait uses  a walker with slight dragging of right leg. Diminished foot tapping on right side.ASSESSMENT  Mr. Polo Torchia is a 80 y.o. male presenting with transient right LE weakness. Imaging confirms a small left corona radiata infarct and three punctate lacunar infarcts (right caudate nucleus, left occipital pole, left left parietal lobe). Infarcts felt to be  embolic secondary to atrial fibrillation despite anticoagulation with Eliquis  On apixaban 2.5 mg twice daily prior to admission. Now on apixaban 5 mg twice daily for secondary stroke prevention. Patient with resultant mild gait difficulty. Work up underway.  atrial fibrillation  hypertension Hyperlipidemia, LDL 71, on no statn PTA, now on no statin, goal LDL < 70 for diabetics Diabetes, HgbA1c 6.3, goal < 7.0 Hx stroke - right brain stroke [id number], embolic secondary to atrial fibrillation, on coumadin at time of stroke, changed to apixaban 5 mg twice daily CAD - CABG 1989, angioplasty w/ stent 2008 [facility] day # 3  TREATMENT/PLAN  Continue apixaban for secondary stroke  prevention.I had a long d/w patient about lack of data supporting changing over to Pradaxa or Xarelto being superior to staying on apixaban and he agrees with current plan.  Mobilize out of bed as tolerated  Home health ST/PT/OT  Plan discharge to [facility]  D/w patient and Dr Conley Canal

## 2013-05-03 NOTE — Progress Notes (Signed)
Agree with the note and plans as outlined. BP has responded to med adjustments. Facial droop is better.

## 2013-05-06 ENCOUNTER — Telehealth: Payer: Self-pay | Admitting: Neurology

## 2013-05-06 DIAGNOSIS — M6281 Muscle weakness (generalized): Secondary | ICD-10-CM | POA: Diagnosis not present

## 2013-05-06 DIAGNOSIS — R1312 Dysphagia, oropharyngeal phase: Secondary | ICD-10-CM | POA: Diagnosis not present

## 2013-05-06 DIAGNOSIS — R29818 Other symptoms and signs involving the nervous system: Secondary | ICD-10-CM | POA: Diagnosis not present

## 2013-05-06 DIAGNOSIS — R269 Unspecified abnormalities of gait and mobility: Secondary | ICD-10-CM | POA: Diagnosis not present

## 2013-05-06 NOTE — Telephone Encounter (Signed)
Pt called states he saw Dr. Leonie Man in the hospital this weekend, pt wants to know if he still needs to come in 05/07/13. Pt wants Dr. Leonie Man or his nurse to call him back on his cell # or his wife's cell # [phone], pt is in the middle of moving from the hospital to a facility today. I didn't cancel this apt due to I didn't feel comfortable with not knowing his condition.

## 2013-05-06 NOTE — Telephone Encounter (Signed)
Patient said that he saw Dr Leonie Man on 02/24, should he still keep his appointment on tomorrow or schedule further out?

## 2013-05-06 NOTE — Telephone Encounter (Signed)
High spoke to the patient's wife. Since I recently saw him in the hospital I would like to postpone his followup visit with me to first week of April.

## 2013-05-07 ENCOUNTER — Ambulatory Visit: Payer: Medicare Other | Admitting: Neurology

## 2013-05-07 ENCOUNTER — Encounter: Payer: Self-pay | Admitting: Internal Medicine

## 2013-05-07 ENCOUNTER — Telehealth: Payer: Self-pay | Admitting: Neurology

## 2013-05-07 DIAGNOSIS — R269 Unspecified abnormalities of gait and mobility: Secondary | ICD-10-CM | POA: Diagnosis not present

## 2013-05-07 DIAGNOSIS — M6281 Muscle weakness (generalized): Secondary | ICD-10-CM | POA: Diagnosis not present

## 2013-05-07 DIAGNOSIS — R1312 Dysphagia, oropharyngeal phase: Secondary | ICD-10-CM | POA: Diagnosis not present

## 2013-05-07 DIAGNOSIS — R29818 Other symptoms and signs involving the nervous system: Secondary | ICD-10-CM | POA: Diagnosis not present

## 2013-05-07 NOTE — Telephone Encounter (Signed)
Called patient, no answer and left VM message per Dr Leonie Man that he does not have to come in today for his appt, to call back to reschedule in April

## 2013-05-07 NOTE — Telephone Encounter (Signed)
Left message for patient to call back and reschedule today's appointment per Dr. Leonie Man.

## 2013-05-08 DIAGNOSIS — M6281 Muscle weakness (generalized): Secondary | ICD-10-CM | POA: Diagnosis not present

## 2013-05-08 DIAGNOSIS — R29818 Other symptoms and signs involving the nervous system: Secondary | ICD-10-CM | POA: Diagnosis not present

## 2013-05-08 DIAGNOSIS — R1312 Dysphagia, oropharyngeal phase: Secondary | ICD-10-CM | POA: Diagnosis not present

## 2013-05-08 DIAGNOSIS — R269 Unspecified abnormalities of gait and mobility: Secondary | ICD-10-CM | POA: Diagnosis not present

## 2013-05-09 ENCOUNTER — Telehealth: Payer: Self-pay | Admitting: Pharmacist

## 2013-05-09 ENCOUNTER — Non-Acute Institutional Stay: Payer: Medicare Other | Admitting: Nurse Practitioner

## 2013-05-09 ENCOUNTER — Encounter: Payer: Self-pay | Admitting: Nurse Practitioner

## 2013-05-09 DIAGNOSIS — M109 Gout, unspecified: Secondary | ICD-10-CM

## 2013-05-09 DIAGNOSIS — I6350 Cerebral infarction due to unspecified occlusion or stenosis of unspecified cerebral artery: Secondary | ICD-10-CM

## 2013-05-09 DIAGNOSIS — F329 Major depressive disorder, single episode, unspecified: Secondary | ICD-10-CM

## 2013-05-09 DIAGNOSIS — I639 Cerebral infarction, unspecified: Secondary | ICD-10-CM

## 2013-05-09 DIAGNOSIS — E039 Hypothyroidism, unspecified: Secondary | ICD-10-CM

## 2013-05-09 DIAGNOSIS — N401 Enlarged prostate with lower urinary tract symptoms: Secondary | ICD-10-CM

## 2013-05-09 DIAGNOSIS — I5023 Acute on chronic systolic (congestive) heart failure: Secondary | ICD-10-CM

## 2013-05-09 DIAGNOSIS — G459 Transient cerebral ischemic attack, unspecified: Secondary | ICD-10-CM | POA: Diagnosis not present

## 2013-05-09 DIAGNOSIS — I4891 Unspecified atrial fibrillation: Secondary | ICD-10-CM | POA: Diagnosis not present

## 2013-05-09 DIAGNOSIS — I10 Essential (primary) hypertension: Secondary | ICD-10-CM | POA: Diagnosis not present

## 2013-05-09 DIAGNOSIS — I509 Heart failure, unspecified: Secondary | ICD-10-CM

## 2013-05-09 NOTE — Assessment & Plan Note (Signed)
C/o cannot make it to commode-incontinent of bladder, denied dysuria, hematuria, or suprapubic/CVA tenderness. May consider Myrbetriq if worse.

## 2013-05-09 NOTE — Telephone Encounter (Signed)
Spoke with patient regarding need to post-pone drug load for 4 weeks.  Patient now on Eliquis 5 mg bid.  Patient scheduled to get Tikosyn load on 06/05/13.  Appointment made to see me in [facility] 06/05/13 and will plan to admit for loading later that day assuming EKG and blood work is appropriate.  Patient shows verbal understanding of plan.   FYI to Dr. Debara Pickett

## 2013-05-09 NOTE — Assessment & Plan Note (Signed)
Metoprolol was increased from 50 mg twice a day to 75 mg twice a day for better blood pressure control. Patient had resolution of his symptoms.

## 2013-05-09 NOTE — Assessment & Plan Note (Signed)
No flare ups. Takes Allopurinol 50mg daily.   

## 2013-05-09 NOTE — Assessment & Plan Note (Signed)
table, remains eu volemic, takes Furosemide 20mg  daily, update BMP

## 2013-05-09 NOTE — Telephone Encounter (Signed)
Message copied by Bishop Limbo on Thu May 09, 2013  1:51 PM ------      Message from: Vikki Gains, Maralyn Sago      Created: Mon May 06, 2013 12:53 PM      Regarding: RE: Jon [name]       Patient had CVA late 04/2013 on Eliquis 2.5 mg bid, therefore tikosyn load will need to be rescheduled for at least 1 month.  His Eliquis was increased to 5 mg bid by Dr. Haroldine Laws and told to wait 4 more weeks before tikosyn load.  Patient is in assisted living currently per hospital note.            ----- Message -----         From: Aris Georgia, [place]: 04/29/2013   9:25 AM           To: Maralyn Sago Janes [place], [place]      Subject: FW: Jon [name]                                                          ----- Message -----         From: Fidel Levy, RN         Sent: 04/26/2013   5:34 PM           To: Aris Georgia, RPH      Subject: Jon [name]!            Dr Debara Pickett wants to admit this patient on 3/9 to start him on Tikosyn in anticipation of a cardioversion on 3/11 after he's been on this antiarrhythmic therapy for 3 days. I was told to contact you for information on how to coordinate this. The patient was seen in the office on 2/20 and is aware of this.        ------

## 2013-05-09 NOTE — Assessment & Plan Note (Addendum)
s/p right CVA,ischemic stroke Dec 2014 with residual dysarthria and  face droop,

## 2013-05-09 NOTE — Assessment & Plan Note (Addendum)
Cardiology plans to admit patient in 4-6 weeks for a Tikosyn load. Presently his heart rate is controlled. Neurology and cardiology consulted. Liquids was increased to 5 mg twice daily. He was on coumadin until his stroke last December and was switched to apixaban 2.5 mg daily which he has been taking faithfully without reported side effects.

## 2013-05-09 NOTE — Assessment & Plan Note (Addendum)
Hospitalized 04/2013 for c/o he lost control of that leg for approximately 30 minutes. Neurology and cardiology consulted. Liquids was increased to 5 mg twice daily. Aspirin was stopped. Symptoms resolved. CT brain upon arrival to the ED showed findings consistent with an area of subacute to chronic lacunar infarction left MCA distribution and a subsequent MRI-DWI demonstrated: " small acute/subacute infarct left corona radiata. No mass effect or hemorrhage. 2. Three superimposed punctate acute lacunar infarcts (right caudate  nucleus, left occipital pole, left left parietal lobe). These could reflect synchronous small vessel ischemia rather than a recent embolic event".   MRA brain showed intracranial atherosclerosis and fusiform enlargement of the cavernous left ICA up to 9 mm diameter as well as fusiform enlargement of the right ICA terminus, up to 6 mm diameter.

## 2013-05-09 NOTE — Assessment & Plan Note (Signed)
Difficulty falling asleep--takes Ativan almost routinely-occasional takes Ambien. Lorazepam 0.5mg  hs prn and q6hr prn available to him.

## 2013-05-09 NOTE — Assessment & Plan Note (Signed)
akes Levothyroxine 43mcg, TSH 3.597 01/08/13. Update TSH

## 2013-05-09 NOTE — Progress Notes (Signed)
Patient ID: Jon [name], male   DOB: 05/08/1921, 80 y.o.   MRN: [id number]   Code Status: DNR  Allergies  Allergen Reactions  . Caduet [Amlodipine-Atorvastatin] Other (See Comments)    Weakness   . Crestor [Rosuvastatin] Other (See Comments)    Muscle pain/weakness  . Cymbalta [Duloxetine Hcl] Other (See Comments)    Excessive sedation  . Lipitor [Atorvastatin Calcium] Other (See Comments)    Muscle pain/weakness  . Lisinopril Other (See Comments)    Doesn't remember reaction  . Simvastatin Other (See Comments)    Muscle pain/weakness    Chief Complaint  Patient presents with  . Medical Managment of Chronic Issues    CVA, urinary incontience.   Marland Kitchen Hospitalization Follow-up    HPI: Patient is a 80 y.o. male seen in the [facility] at [facility] today for evaluation of hospitalization for acute CVA and other chronic medical conditions.   Hospitalized from 04/30/2013 to 05/03/2013 for RLE weakness which lasted about 30 minutes. TIA was considered given nature of the problem and CT brain findings/MRI and MRA brain. He was discharged with increased Eliquis and adjusting of antihypertensive medications.   Problem List Items Addressed This Visit   TIA (transient ischemic attack) (Chronic)     Hospitalized 04/2013 for c/o he lost control of that leg for approximately 30 minutes. Neurology and cardiology consulted. Liquids was increased to 5 mg twice daily. Aspirin was stopped. Symptoms resolved. CT brain upon arrival to the ED showed findings consistent with an area of subacute to chronic lacunar infarction left MCA distribution and a subsequent MRI-DWI demonstrated: " small acute/subacute infarct left corona radiata. No mass effect or hemorrhage. 2. Three superimposed punctate acute lacunar infarcts (right caudate  nucleus, left occipital pole, left left parietal lobe). These could reflect synchronous small vessel ischemia rather than a recent embolic event".   MRA brain showed  intracranial atherosclerosis and fusiform enlargement of the cavernous left ICA up to 9 mm diameter as well as fusiform enlargement of the right ICA terminus, up to 6 mm diameter.       Major depressive disorder, single episode, unspecified     Difficulty falling asleep--takes Ativan almost routinely-occasional takes Ambien. Lorazepam 0.5mg  hs prn and q6hr prn available to him.      Relevant Medications      LORazepam (ATIVAN) 0.5 MG tablet   Hypothyroidism (Chronic)     akes Levothyroxine 26mcg, TSH 3.597 01/08/13. Update TSH      Hypertrophy of prostate with urinary obstruction and other lower urinary tract symptoms (LUTS)     C/o cannot make it to commode-incontinent of bladder, denied dysuria, hematuria, or suprapubic/CVA tenderness. May consider Myrbetriq if worse.     HTN (hypertension) (Chronic)     Metoprolol was increased from 50 mg twice a day to 75 mg twice a day for better blood pressure control. Patient had resolution of his symptoms.     Gout, unspecified     No flare ups. Takes Allopurinol 50mg  daily.      CVA - Rt brain stroke 12/14 (Chronic)     s/p right CVA,ischemic stroke Dec 2014 with residual dysarthria and  face droop,    Atrial fibrillation - Primary (Chronic)     Cardiology plans to admit patient in 4-6 weeks for a Tikosyn load. Presently his heart rate is controlled. Neurology and cardiology consulted. Liquids was increased to 5 mg twice daily. He was on coumadin until his stroke last December and was switched to  apixaban 2.5 mg daily which he has been taking faithfully without reported side effects.    Acute on chronic systolic congestive heart failure, NYHA class 2     table, remains eu volemic, takes Furosemide 20mg  daily, update BMP          Review of Systems:  Review of Systems  Constitutional: Negative for fever, chills, weight loss, malaise/fatigue and diaphoresis.  HENT: Positive for hearing loss. Negative for congestion, ear discharge, ear  pain, nosebleeds, sore throat and tinnitus.        Clear running nose-worsens with meals.   Eyes: Negative for blurred vision, double vision, photophobia, pain, discharge and redness.       Dry eyes  Respiratory: Negative for cough, hemoptysis, sputum production, shortness of breath, wheezing and stridor.   Cardiovascular: Positive for leg swelling and PND. Negative for chest pain, palpitations, orthopnea and claudication.  Gastrointestinal: Negative for heartburn, nausea, abdominal pain, diarrhea, constipation and blood in stool.  Genitourinary: Positive for urgency and frequency. Negative for dysuria, hematuria and flank pain.       Cannot make it to commode sometimes.   Musculoskeletal: Negative for back pain, falls, joint pain, myalgias and neck pain.  Skin: Positive for itching. Negative for rash.       Chronic, Benadryl available to him.   Neurological: Positive for speech change and focal weakness. Negative for dizziness, tingling, tremors, sensory change, seizures, loss of consciousness, weakness and headaches.       Mild R facial droop and right sided weakness. Grip strength 5/5-but weaker than the left. Unsteady gait.   Endo/Heme/Allergies: Positive for environmental allergies. Negative for polydipsia. Does not bruise/bleed easily.  Psychiatric/Behavioral: Positive for depression and memory loss. Negative for suicidal ideas, hallucinations and substance abuse. The patient is nervous/anxious and has insomnia.      Past Medical History  Diagnosis Date  . Hypertension   . Coronary artery disease   . Arthritis   . Cataract   . Anxiety   . Blood transfusion without reported diagnosis   . Hyperlipidemia   . Heart murmur   . Allergy   . Thyroid disease   . PAF (paroxysmal atrial fibrillation)     coumadin  . History of abdominal aortic aneurysm   . History of echocardiogram 12/2008    EF >55%; mild concentric LVH; mild MR; mild-mod TR; mild AV regurg;   . History of nuclear  stress test 12/2010    lexiscan; low risk; compared to prior study, perfusion improved  . History of Doppler ultrasound 05/22/2012    LEAs; R anterior tibial artery appeared occluded; L posterior tibial shows short segment of occlusive ds  . History of Doppler ultrasound 05/22/2012    Abdominal Aortic Doppler; slight increase in fusiform aneurysm   . Stroke    Past Surgical History  Procedure Laterality Date  . Coronary artery bypass graft  1989  . Pacemaker insertion  2011  . Insert / replace / remove pacemaker    . Eye surgery    . Joint replacement    . Hernia repair    . Coronary angioplasty with stent placement  2008    stent to SVG to OM   Social History:   reports that he quit smoking about 46 years ago. His smoking use included Cigarettes. He has a 11.25 pack-year smoking history. He has never used smokeless tobacco. He reports that he does not drink alcohol or use illicit drugs.  Family History  Problem Relation Age of  Onset  . Diabetes Father   . Other Mother     PAD with amputations    Medications: Patient's Medications  New Prescriptions   No medications on file  Previous Medications   ALLOPURINOL (ZYLOPRIM) 100 MG TABLET    Take 50 mg by mouth daily.    APIXABAN (ELIQUIS) 5 MG TABS TABLET    Take 1 tablet (5 mg total) by mouth 2 (two) times daily.   CHOLECALCIFEROL (VITAMIN D) 400 UNITS CAPSULE    Take 400 Units by mouth 2 (two) times daily.    FUROSEMIDE (LASIX) 20 MG TABLET    Take 1 tablet (20 mg total) by mouth daily.   HYDROXYZINE (ATARAX/VISTARIL) 10 MG TABLET    Take 10 mg by mouth 4 (four) times daily -  with meals and at bedtime.   IPRATROPIUM (ATROVENT) 0.03 % NASAL SPRAY    Place 1 spray into both nostrils 3 (three) times daily as needed (post nasal drip).    LEVOTHYROXINE (SYNTHROID, LEVOTHROID) 100 MCG TABLET    Take 100 mcg by mouth daily before breakfast.   LOSARTAN (COZAAR) 100 MG TABLET    Take 1 tablet (100 mg total) by mouth daily.   METOPROLOL  TARTRATE (LOPRESSOR) 25 MG TABLET    Take 3 tablets (75 mg total) by mouth 2 (two) times daily.   NITROGLYCERIN (NITROSTAT) 0.4 MG SL TABLET    Place 0.4 mg under the tongue every 5 (five) minutes as needed for chest pain.    POLYVINYL ALCOHOL (LIQUIFILM TEARS) 1.4 % OPHTHALMIC SOLUTION    Place 1 drop into both eyes 4 (four) times daily as needed for dry eyes.   POTASSIUM CHLORIDE (MICRO-K) 10 MEQ CR CAPSULE    Take 10 mEq by mouth 2 (two) times daily.   SKIN PROTECTANTS, MISC. (EUCERIN) CREAM    Apply 1 application topically daily as needed for dry skin (itching).   ZOLPIDEM (AMBIEN) 10 MG TABLET    Take 2.5-5 mg by mouth at bedtime as needed for sleep.  Modified Medications   Modified Medication Previous Medication   LORAZEPAM (ATIVAN) 0.5 MG TABLET LORazepam (ATIVAN) 0.5 MG tablet      0.5 mg at bedtime. And q6hr prn.    qhs prn sleep  Discontinued Medications   No medications on file     Physical Exam: Physical Exam  Nursing note and vitals reviewed. Constitutional: He is oriented to person, place, and time. He appears well-developed and well-nourished. No distress.  HENT:  Severe hearing loss. Bilateral aides. Right facial droop.  Eyes:  Corrective lenses. Increased exposure of the right cornea due to post CVA facial droop.  Neck: No JVD present. No tracheal deviation present. No thyromegaly present.  Cardiovascular: Normal rate, regular rhythm and normal heart sounds.   No murmur heard. Pulmonary/Chest: Effort normal and breath sounds normal. He has no wheezes. He has no rales.  Abdominal: Soft. Bowel sounds are normal. He exhibits no distension and no mass. There is no tenderness.  Musculoskeletal: Normal range of motion. He exhibits no edema and no tenderness.  Unstable gait. Using 4 wheel walker. Weaker on the right side.  Lymphadenopathy:    He has no cervical adenopathy.  Neurological: He is alert and oriented to person, place, and time. He displays normal reflexes. No  cranial nerve deficit. He exhibits normal muscle tone. Coordination normal.  Slurred speech. Right side weakness.  Skin: Skin is warm and dry. No rash noted. No erythema. No pallor.  Left great  toenail is growing out and is loose at the dista end. Had prior trauma.  Psychiatric: He has a normal mood and affect. His behavior is normal. Judgment and thought content normal.    Filed Vitals:   05/09/13 1224  BP: 100/80  Pulse: 76  Temp: 97 F (36.1 C)  TempSrc: Tympanic  Resp: 18      Labs reviewed: Basic Metabolic Panel:  Recent Labs  01/08/13  02/22/13 0943 03/26/13 03/27/13 1636 04/30/13 1424 04/30/13 1425  NA 133*  < > 130* 137 134* 141  --   K 4.3  < > 4.0 4.5 4.7 4.2  --   CL  --   < > 97  --  99 104  --   CO2  --   < > 23  --  25 24  --   GLUCOSE  --   < > 131*  --  107* 111*  --   BUN 15  < > 14 23* 25* 28*  --   CREATININE 1.0  < > 0.74 1.3 1.18 1.11  --   CALCIUM  --   < > 8.7  --  9.7 9.0  --   MG  --   --   --   --  2.3  --  2.2  TSH 3.60  --   --   --  6.812*  --   --   < > = values in this interval not displayed. Liver Function Tests:  Recent Labs  02/18/13 2025 03/27/13 1636 04/30/13 1424  AST 23 22 21   ALT 16 19 16   ALKPHOS 90 81 81  BILITOT 0.7 1.1 0.7  PROT 6.7 6.8 6.4  ALBUMIN 3.6 4.3 3.3*   CBC:  Recent Labs  11/08/12 1611 02/18/13 2025 02/18/13 2032 03/27/13 1636 04/30/13 1424  WBC 7.2 6.9  --  9.0 6.9  NEUTROABS 4.7 3.7  --   --  4.5  HGB 16.0 15.5 16.3 16.5 15.7  HCT 47.4 43.1 48.0 47.2 44.6  MCV 95.4 92.3  --  93.7 92.9  PLT 260.0 217  --  241 207   Lipid Panel:  Recent Labs  02/19/13 0355 03/26/13 05/01/13 0528  CHOL 187 89 117  HDL 34* 31* 30*  LDLCALC 117* 47 71  TRIG 181* 54 79  CHOLHDL 5.5  --  3.9     Past Procedures:   04/30/2013   CLINICAL DATA:  Stroke.  Ex-smoker.  EXAM: CHEST  2 VIEW  .  IMPRESSION: Minimal bibasilar linear atelectasis.     04/30/2013   CLINICAL DATA:  TRANSIENT ISCHEMIC ATTACK  EXAM:  CT HEAD WITHOUT CONTRAST  IMPRESSION: Findings consistent with an area of subacute to chronic lacunar infarction left MCA distribution. Involutional and chronic changes also appreciated. There is no evidence of acute abnormalities.     05/01/2013   CLINICAL DATA:  80 year old male with previous stroke, right lower extremity weakness. Awoke with increased weakness today. History of atrial fibrillation, with [employer] pacemaker (monitored by [employer] representative and nursing staff for this exam with no complications observed). Initial encounter.  EXAM: MRI HEAD WITHOUT CONTRAST  MRA HEAD WITHOUT CONTRAST  TECHNIQUE: Multiplanar, multiecho pulse sequences of the brain and surrounding structures were obtained without intravenous contrast. Angiographic images of the head were obtained using MRA technique without contrast.  COMPARISON:  Head CTs 04/30/2013 and earlier.  FINDINGS: MRI HEAD FINDINGS  Round/oval 15 mm area of diffusion signal abnormality in the left posterior corona radiata extending  toward the insula (series 4, image 16, series 3, image 21) does show mild decreased diffusion on ADC. Associated T2 and FLAIR hyperintensity without mass effect or hemorrhage.  Additionally there are 3 other punctate areas of restricted diffusion identified, 1 in the posterior left parietal lobe, 1 in the left occipital pole, and 1 in the contralateral right caudate nucleus (series 3 images 17 and).  Major intracranial vascular flow voids are preserved.  Patchy and confluent cerebral white matter T2 and FLAIR hyperintensity superimposed on the above findings. Chronic lacunar infarcts in the deep gray matter nuclei, most notably of the right thalamus. Chronic lacunar infarcts in the left cerebellar hemisphere. Mild T2 and FLAIR hyperintensity in the pons. Occasional chronic micro hemorrhages in the brain (right thalamus series 10, image 13).  Visible internal auditory structures appear normal. Negative pituitary,  cervicomedullary junction and visualized cervical spine. Normal bone marrow signal. No ventriculomegaly. No midline shift, mass effect, or evidence of intracranial mass lesion. Stable cerebral volume.  Postoperative changes to the globes. Visualized paranasal sinuses and mastoids are clear. Visualized scalp soft tissues are within normal limits.  MRA HEAD FINDINGS  Antegrade flow in the posterior circulation. Dominant distal left vertebral artery. Irregularity of the distal right vertebral artery suggesting tandem stenosis. Still, the right PICA origin remains patent. Left vertebral primarily supplies the basilar. Patent left PICA origin. Basilar artery irregularity but no definite stenosis. SCA and left PCA origins are normal. Fetal type right PCA origin. Normal left posterior communicating artery. Bilateral PCA branches within normal limits.  Antegrade flow in both carotid termini. Fusiform enlargement of the cavernous left ICA up to 9 mm diameter. There is also fusiform enlargement of the right ICA terminus, up to 6 mm diameter. Evidence of calcified ICA plaque bilaterally, but no definite ICA stenosis.  Patent carotid termini. Ophthalmic and posterior communicating artery origins are within normal limits. MCA and ACA origins are patent. Dominant left ACA A1 segment. Anterior communicating artery and visualized ACA branches are within normal limits. Left MCA branches are within normal limits.  Irregularity suggesting calcified plaque at the right ICA origin, but mild if any stenosis. Right MCA M1 segment remains patent. Visualized right MCA branches grossly normal.  IMPRESSION: 1. Small acute/subacute infarct left corona radiata. No mass effect or hemorrhage. 2. Three superimposed punctate acute lacunar infarcts (right caudate nucleus, left occipital pole, left left parietal lobe). These could reflect synchronous small vessel ischemia rather than a recent embolic event. 3. Underlying chronic small vessel  ischemia. 4. Intracranial atherosclerosis. Fusiform aneurysmal enlargement of the left ICA (cavernous segment) and right ICA terminus. No anterior circulation major branch occlusion. 5. Atherosclerosis in stenosis of the distal right vertebral artery which appears hemodynamically significant. Other posterior circulation atherosclerosis without stenosis.   Electronically Signed   By: Lars Pinks M.D.   On: 05/01/2013 13:59   Mr Jodene Nam Head/brain Wo Cm  05/01/2013   CLINICAL DATA:  80 year old male with previous stroke, right lower extremity weakness. Awoke with increased weakness today. History of atrial fibrillation, with [employer] pacemaker (monitored by [employer] representative and nursing staff for this exam with no complications observed). Initial encounter.  EXAM: MRI HEAD WITHOUT CONTRAST  MRA HEAD WITHOUT CONTRAST   IMPRESSION: 1. Small acute/subacute infarct left corona radiata. No mass effect or hemorrhage. 2. Three superimposed punctate acute lacunar infarcts (right caudate nucleus, left occipital pole, left left parietal lobe). These could reflect synchronous small vessel ischemia rather than a recent embolic event. 3. Underlying chronic small vessel  ischemia. 4. Intracranial atherosclerosis. Fusiform aneurysmal enlargement of the left ICA (cavernous segment) and right ICA terminus. No anterior circulation major branch occlusion. 5. Atherosclerosis in stenosis of the distal right vertebral artery which appears hemodynamically significant. Other posterior circulation atherosclerosis without stenosis.     EKG Atrial fibrillation Right bundle branch block ST depr, consider ischemia, anterolateral lds Baseline wander in lead(s) II III aVR aVL aVF V3   Assessment/Plan Atrial fibrillation Cardiology plans to admit patient in 4-6 weeks for a Tikosyn load. Presently his heart rate is controlled. Neurology and cardiology consulted. Liquids was increased to 5 mg twice daily. He was on coumadin until his  stroke last December and was switched to apixaban 2.5 mg daily which he has been taking faithfully without reported side effects.  TIA (transient ischemic attack) Hospitalized 04/2013 for c/o he lost control of that leg for approximately 30 minutes. Neurology and cardiology consulted. Liquids was increased to 5 mg twice daily. Aspirin was stopped. Symptoms resolved. CT brain upon arrival to the ED showed findings consistent with an area of subacute to chronic lacunar infarction left MCA distribution and a subsequent MRI-DWI demonstrated: " small acute/subacute infarct left corona radiata. No mass effect or hemorrhage. 2. Three superimposed punctate acute lacunar infarcts (right caudate  nucleus, left occipital pole, left left parietal lobe). These could reflect synchronous small vessel ischemia rather than a recent embolic event".   MRA brain showed intracranial atherosclerosis and fusiform enlargement of the cavernous left ICA up to 9 mm diameter as well as fusiform enlargement of the right ICA terminus, up to 6 mm diameter.     HTN (hypertension) Metoprolol was increased from 50 mg twice a day to 75 mg twice a day for better blood pressure control. Patient had resolution of his symptoms.   CVA - Rt brain stroke 12/14 s/p right CVA,ischemic stroke Dec 2014 with residual dysarthria and  face droop,  Major depressive disorder, single episode, unspecified Difficulty falling asleep--takes Ativan almost routinely-occasional takes Ambien. Lorazepam 0.5mg  hs prn and q6hr prn available to him.    Hypothyroidism akes Levothyroxine 74mcg, TSH 3.597 01/08/13. Update TSH    Gout, unspecified No flare ups. Takes Allopurinol 50mg  daily.    Acute on chronic systolic congestive heart failure, NYHA class 2 table, remains eu volemic, takes Furosemide 20mg  daily, update BMP     Hypertrophy of prostate with urinary obstruction and other lower urinary tract symptoms (LUTS) C/o cannot make it to  commode-incontinent of bladder, denied dysuria, hematuria, or suprapubic/CVA tenderness. May consider Myrbetriq if worse.     Family/ Staff Communication: observe the patient.   Goals of Care: AL  Labs/tests ordered:  BMP and TSH

## 2013-05-10 ENCOUNTER — Ambulatory Visit: Payer: Self-pay | Admitting: Pharmacist Clinician (PhC)/ Clinical Pharmacy Specialist

## 2013-05-10 ENCOUNTER — Non-Acute Institutional Stay: Payer: Medicare Other | Admitting: Internal Medicine

## 2013-05-10 DIAGNOSIS — I4891 Unspecified atrial fibrillation: Secondary | ICD-10-CM

## 2013-05-10 DIAGNOSIS — I63512 Cerebral infarction due to unspecified occlusion or stenosis of left middle cerebral artery: Secondary | ICD-10-CM

## 2013-05-10 DIAGNOSIS — I5023 Acute on chronic systolic (congestive) heart failure: Secondary | ICD-10-CM | POA: Diagnosis not present

## 2013-05-10 DIAGNOSIS — I10 Essential (primary) hypertension: Secondary | ICD-10-CM

## 2013-05-10 DIAGNOSIS — I69328 Other speech and language deficits following cerebral infarction: Secondary | ICD-10-CM

## 2013-05-10 DIAGNOSIS — E119 Type 2 diabetes mellitus without complications: Secondary | ICD-10-CM

## 2013-05-10 DIAGNOSIS — I6350 Cerebral infarction due to unspecified occlusion or stenosis of unspecified cerebral artery: Secondary | ICD-10-CM

## 2013-05-10 DIAGNOSIS — Z7901 Long term (current) use of anticoagulants: Secondary | ICD-10-CM

## 2013-05-10 DIAGNOSIS — I509 Heart failure, unspecified: Secondary | ICD-10-CM

## 2013-05-10 DIAGNOSIS — I69928 Other speech and language deficits following unspecified cerebrovascular disease: Secondary | ICD-10-CM

## 2013-05-13 DIAGNOSIS — R29818 Other symptoms and signs involving the nervous system: Secondary | ICD-10-CM | POA: Diagnosis not present

## 2013-05-13 DIAGNOSIS — R269 Unspecified abnormalities of gait and mobility: Secondary | ICD-10-CM | POA: Diagnosis not present

## 2013-05-13 DIAGNOSIS — M6281 Muscle weakness (generalized): Secondary | ICD-10-CM | POA: Diagnosis not present

## 2013-05-13 DIAGNOSIS — R1312 Dysphagia, oropharyngeal phase: Secondary | ICD-10-CM | POA: Diagnosis not present

## 2013-05-14 DIAGNOSIS — E039 Hypothyroidism, unspecified: Secondary | ICD-10-CM | POA: Diagnosis not present

## 2013-05-14 DIAGNOSIS — E559 Vitamin D deficiency, unspecified: Secondary | ICD-10-CM | POA: Diagnosis not present

## 2013-05-14 DIAGNOSIS — I10 Essential (primary) hypertension: Secondary | ICD-10-CM | POA: Diagnosis not present

## 2013-05-14 LAB — TSH: TSH: 3.68 u[IU]/mL (ref 0.41–5.90)

## 2013-05-14 LAB — BASIC METABOLIC PANEL
BUN: 19 mg/dL (ref 4–21)
CREATININE: 1.1 mg/dL (ref 0.6–1.3)
Glucose: 119 mg/dL
Potassium: 4.5 mmol/L (ref 3.4–5.3)
SODIUM: 139 mmol/L (ref 137–147)

## 2013-05-14 LAB — HEPATIC FUNCTION PANEL
ALK PHOS: 86 U/L (ref 25–125)
ALT: 16 U/L (ref 10–40)
AST: 18 U/L (ref 14–40)
Bilirubin, Total: 1.4 mg/dL

## 2013-05-16 DIAGNOSIS — M6281 Muscle weakness (generalized): Secondary | ICD-10-CM | POA: Diagnosis not present

## 2013-05-16 DIAGNOSIS — E039 Hypothyroidism, unspecified: Secondary | ICD-10-CM | POA: Diagnosis not present

## 2013-05-16 DIAGNOSIS — R29818 Other symptoms and signs involving the nervous system: Secondary | ICD-10-CM | POA: Diagnosis not present

## 2013-05-16 DIAGNOSIS — R1312 Dysphagia, oropharyngeal phase: Secondary | ICD-10-CM | POA: Diagnosis not present

## 2013-05-16 DIAGNOSIS — R269 Unspecified abnormalities of gait and mobility: Secondary | ICD-10-CM | POA: Diagnosis not present

## 2013-05-16 LAB — TSH: TSH: 3.15 u[IU]/mL (ref 0.41–5.90)

## 2013-05-20 DIAGNOSIS — R29818 Other symptoms and signs involving the nervous system: Secondary | ICD-10-CM | POA: Diagnosis not present

## 2013-05-20 DIAGNOSIS — R269 Unspecified abnormalities of gait and mobility: Secondary | ICD-10-CM | POA: Diagnosis not present

## 2013-05-20 DIAGNOSIS — R1312 Dysphagia, oropharyngeal phase: Secondary | ICD-10-CM | POA: Diagnosis not present

## 2013-05-20 DIAGNOSIS — M6281 Muscle weakness (generalized): Secondary | ICD-10-CM | POA: Diagnosis not present

## 2013-05-22 DIAGNOSIS — M6281 Muscle weakness (generalized): Secondary | ICD-10-CM | POA: Diagnosis not present

## 2013-05-22 DIAGNOSIS — R269 Unspecified abnormalities of gait and mobility: Secondary | ICD-10-CM | POA: Diagnosis not present

## 2013-05-22 DIAGNOSIS — R29818 Other symptoms and signs involving the nervous system: Secondary | ICD-10-CM | POA: Diagnosis not present

## 2013-05-22 DIAGNOSIS — R1312 Dysphagia, oropharyngeal phase: Secondary | ICD-10-CM | POA: Diagnosis not present

## 2013-05-23 ENCOUNTER — Non-Acute Institutional Stay: Payer: Medicare Other | Admitting: Nurse Practitioner

## 2013-05-23 ENCOUNTER — Encounter: Payer: Self-pay | Admitting: Internal Medicine

## 2013-05-23 ENCOUNTER — Encounter: Payer: Self-pay | Admitting: Nurse Practitioner

## 2013-05-23 ENCOUNTER — Ambulatory Visit (INDEPENDENT_AMBULATORY_CARE_PROVIDER_SITE_OTHER): Payer: Medicare Other | Admitting: Internal Medicine

## 2013-05-23 VITALS — BP 122/86 | HR 68 | Ht 68.0 in | Wt 163.8 lb

## 2013-05-23 DIAGNOSIS — I2510 Atherosclerotic heart disease of native coronary artery without angina pectoris: Secondary | ICD-10-CM

## 2013-05-23 DIAGNOSIS — J309 Allergic rhinitis, unspecified: Secondary | ICD-10-CM

## 2013-05-23 DIAGNOSIS — I4891 Unspecified atrial fibrillation: Secondary | ICD-10-CM | POA: Diagnosis not present

## 2013-05-23 DIAGNOSIS — I509 Heart failure, unspecified: Secondary | ICD-10-CM

## 2013-05-23 DIAGNOSIS — Z7901 Long term (current) use of anticoagulants: Secondary | ICD-10-CM

## 2013-05-23 DIAGNOSIS — E039 Hypothyroidism, unspecified: Secondary | ICD-10-CM

## 2013-05-23 DIAGNOSIS — R351 Nocturia: Secondary | ICD-10-CM

## 2013-05-23 DIAGNOSIS — Z950 Presence of cardiac pacemaker: Secondary | ICD-10-CM

## 2013-05-23 DIAGNOSIS — I5023 Acute on chronic systolic (congestive) heart failure: Secondary | ICD-10-CM

## 2013-05-23 DIAGNOSIS — Z79899 Other long term (current) drug therapy: Secondary | ICD-10-CM | POA: Diagnosis not present

## 2013-05-23 DIAGNOSIS — I10 Essential (primary) hypertension: Secondary | ICD-10-CM | POA: Diagnosis not present

## 2013-05-23 DIAGNOSIS — M109 Gout, unspecified: Secondary | ICD-10-CM

## 2013-05-23 DIAGNOSIS — R0602 Shortness of breath: Secondary | ICD-10-CM

## 2013-05-23 DIAGNOSIS — F329 Major depressive disorder, single episode, unspecified: Secondary | ICD-10-CM

## 2013-05-23 DIAGNOSIS — H6120 Impacted cerumen, unspecified ear: Secondary | ICD-10-CM

## 2013-05-23 DIAGNOSIS — H6121 Impacted cerumen, right ear: Secondary | ICD-10-CM | POA: Insufficient documentation

## 2013-05-23 DIAGNOSIS — G459 Transient cerebral ischemic attack, unspecified: Secondary | ICD-10-CM

## 2013-05-23 DIAGNOSIS — M25569 Pain in unspecified knee: Secondary | ICD-10-CM

## 2013-05-23 MED ORDER — FUROSEMIDE 20 MG PO TABS: 20.0000 mg | ORAL_TABLET | Freq: Two times a day (BID) | ORAL | Status: DC

## 2013-05-23 MED ORDER — ACETAMINOPHEN 325 MG PO TABS: 650.0000 mg | ORAL_TABLET | Freq: Two times a day (BID) | ORAL | Status: DC

## 2013-05-23 NOTE — Assessment & Plan Note (Signed)
No flare ups. Takes Allopurinol 50mg daily.   

## 2013-05-23 NOTE — Assessment & Plan Note (Signed)
Left knee-chronic-worse-no erythema or swelling-Tylenol 650mg  tid. Hx of the total right knee replacement.

## 2013-05-23 NOTE — Assessment & Plan Note (Signed)
Na 139 05/14/13

## 2013-05-23 NOTE — Assessment & Plan Note (Signed)
Cardiology plans to admit patient in 4-6 weeks for a Tikosyn load. Presently his heart rate is controlled. Neurology and cardiology consulted. Liquids was increased to 5 mg twice daily. He was on coumadin until his stroke last December and was switched to apixaban 2.5 mg daily which he has been taking faithfully without reported side effects. Continue Metoprolol

## 2013-05-23 NOTE — Assessment & Plan Note (Signed)
Difficulty falling asleep--takes Ativan almost routinely-occasional takes Ambien. Has more antsy and episodes of dizziness associated with hyperventilation. Lorazepam 0.5mg  hs prn and q6hr prn available to him.

## 2013-05-23 NOTE — Patient Instructions (Addendum)
Your physician has recommended you make the following change in your medication: INCREASE furosemide to 20mg  TWICE DAILY  Please decrease your tylenol intake to 650mg  twice daily.  Please have labs on Monday or Tuesday  Your physician wants you to follow-up in: 2-4 weeks.

## 2013-05-23 NOTE — Assessment & Plan Note (Signed)
able, remains eu volemic, takes Furosemide 20mg  daily, Bun/creat 19/1.11 05/14/13

## 2013-05-23 NOTE — Assessment & Plan Note (Signed)
SO to remove impacted the right ear wax.

## 2013-05-23 NOTE — Assessment & Plan Note (Signed)
Vs vasomotor rhinitis since clear nasal drainage worsens at meals: prn Atovent nasal spray I spray each nostril ac meals. The left TM-small fluid, the right impacted cerumen. No pain during examination. Normal external ear canal. May consider Claritin.

## 2013-05-23 NOTE — Assessment & Plan Note (Signed)
takes Levothyroxine 38mcg, TSH 3.597 01/08/13 and 3.153 05/16/13

## 2013-05-23 NOTE — Progress Notes (Signed)
Patient ID: Jon [name], male   DOB: October 23, 1921, 80 y.o.   MRN: [id number]   Code Status: DNR  Allergies  Allergen Reactions  . Caduet [Amlodipine-Atorvastatin] Other (See Comments)    Weakness   . Crestor [Rosuvastatin] Other (See Comments)    Muscle pain/weakness  . Cymbalta [Duloxetine Hcl] Other (See Comments)    Excessive sedation  . Lipitor [Atorvastatin Calcium] Other (See Comments)    Muscle pain/weakness  . Lisinopril Other (See Comments)    Doesn't remember reaction  . Simvastatin Other (See Comments)    Muscle pain/weakness    Chief Complaint  Patient presents with  . Medical Managment of Chronic Issues  . Acute Visit    ? head congestion/ear infection, left knee pain    HPI: Patient is a 80 y.o. male seen in the [facility] at [facility] today for evaluation of head congestion, late effect CVA and other chronic medical conditions.   Hospitalized from 04/30/2013 to 05/03/2013 for RLE weakness which lasted about 30 minutes. TIA was considered given nature of the problem and CT brain findings/MRI and MRA brain. He was discharged with increased Eliquis and adjusting of antihypertensive medications.   Problem List Items Addressed This Visit   Atrial fibrillation (Chronic)     Cardiology plans to admit patient in 4-6 weeks for a Tikosyn load. Presently his heart rate is controlled. Neurology and cardiology consulted. Liquids was increased to 5 mg twice daily. He was on coumadin until his stroke last December and was switched to apixaban 2.5 mg daily which he has been taking faithfully without reported side effects. Continue Metoprolol      Hypothyroidism - Primary (Chronic)     takes Levothyroxine 25mcg, TSH 3.597 01/08/13 and 3.153 05/16/13     Long term (current) use of anticoagulants (Chronic)     Takes Eliquis 5mg  bid.     Nocturia     Na 139 05/14/13    Pain in joint, lower leg     Left knee-chronic-worse-no erythema or swelling-Tylenol 650mg  tid. Hx of  the total right knee replacement.     Major depressive disorder, single episode, unspecified     Difficulty falling asleep--takes Ativan almost routinely-occasional takes Ambien. Has more antsy and episodes of dizziness associated with hyperventilation. Lorazepam 0.5mg  hs prn and q6hr prn available to him.      Acute on chronic systolic congestive heart failure, NYHA class 2     able, remains eu volemic, takes Furosemide 20mg  daily, Bun/creat 19/1.11 05/14/13    Gout, unspecified     No flare ups. Takes Allopurinol 50mg  daily.       Allergic rhinitis     Vs vasomotor rhinitis since clear nasal drainage worsens at meals: prn Atovent nasal spray I spray each nostril ac meals. The left TM-small fluid, the right impacted cerumen. No pain during examination. Normal external ear canal. May consider Claritin.      Impacted cerumen of right ear     SO to remove impacted the right ear wax.        Review of Systems:  Review of Systems  Constitutional: Negative for fever, chills, weight loss, malaise/fatigue and diaphoresis.  HENT: Positive for hearing loss. Negative for congestion, ear discharge, ear pain, nosebleeds, sore throat and tinnitus.        Clear running nose-worsens with meals. C/o head congestion.   Eyes: Negative for blurred vision, double vision, photophobia, pain, discharge and redness.       Dry eyes  Respiratory: Negative for cough, hemoptysis, sputum production, shortness of breath, wheezing and stridor.   Cardiovascular: Positive for leg swelling and PND. Negative for chest pain, palpitations, orthopnea and claudication.  Gastrointestinal: Negative for heartburn, nausea, abdominal pain, diarrhea, constipation and blood in stool.  Genitourinary: Positive for urgency and frequency. Negative for dysuria, hematuria and flank pain.       Cannot make it to commode sometimes.   Musculoskeletal: Positive for joint pain. Negative for back pain, falls, myalgias and neck pain.        Left knee pain.   Skin: Positive for itching. Negative for rash.       Chronic, Benadryl available to him.   Neurological: Positive for speech change and focal weakness. Negative for dizziness, tingling, tremors, sensory change, seizures, loss of consciousness, weakness and headaches.       Mild R facial droop and right sided weakness. Grip strength 5/5-but weaker than the left. Unsteady gait.   Endo/Heme/Allergies: Positive for environmental allergies. Negative for polydipsia. Does not bruise/bleed easily.  Psychiatric/Behavioral: Positive for depression and memory loss. Negative for suicidal ideas, hallucinations and substance abuse. The patient is nervous/anxious and has insomnia.      Past Medical History  Diagnosis Date  . Hypertension   . Coronary artery disease   . Arthritis   . Cataract   . Anxiety   . Blood transfusion without reported diagnosis   . Hyperlipidemia   . Heart murmur   . Allergy   . Thyroid disease   . PAF (paroxysmal atrial fibrillation)     coumadin  . History of abdominal aortic aneurysm   . History of echocardiogram 12/2008    EF >55%; mild concentric LVH; mild MR; mild-mod TR; mild AV regurg;   . History of nuclear stress test 12/2010    lexiscan; low risk; compared to prior study, perfusion improved  . History of Doppler ultrasound 05/22/2012    LEAs; R anterior tibial artery appeared occluded; L posterior tibial shows short segment of occlusive ds  . History of Doppler ultrasound 05/22/2012    Abdominal Aortic Doppler; slight increase in fusiform aneurysm   . Stroke    Past Surgical History  Procedure Laterality Date  . Coronary artery bypass graft  1989  . Pacemaker insertion  2011  . Insert / replace / remove pacemaker    . Eye surgery    . Joint replacement    . Hernia repair    . Coronary angioplasty with stent placement  2008    stent to SVG to OM   Social History:   reports that he quit smoking about 46 years ago. His smoking use  included Cigarettes. He has a 11.25 pack-year smoking history. He has never used smokeless tobacco. He reports that he does not drink alcohol or use illicit drugs.  Family History  Problem Relation Age of Onset  . Diabetes Father   . Other Mother     PAD with amputations    Medications: Patient's Medications  New Prescriptions   No medications on file  Previous Medications   ACETAMINOPHEN (TYLENOL) 325 MG TABLET    Take 650 mg by mouth 3 (three) times daily.   ALLOPURINOL (ZYLOPRIM) 100 MG TABLET    Take 50 mg by mouth daily.    APIXABAN (ELIQUIS) 5 MG TABS TABLET    Take 1 tablet (5 mg total) by mouth 2 (two) times daily.   CHOLECALCIFEROL (VITAMIN D) 400 UNITS CAPSULE    Take 400 Units by  mouth 2 (two) times daily.    FUROSEMIDE (LASIX) 20 MG TABLET    Take 1 tablet (20 mg total) by mouth daily.   HYDROXYZINE (ATARAX/VISTARIL) 10 MG TABLET    Take 10 mg by mouth 4 (four) times daily -  with meals and at bedtime.   IPRATROPIUM (ATROVENT) 0.03 % NASAL SPRAY    Place 1 spray into both nostrils 3 (three) times daily as needed (post nasal drip).    LEVOTHYROXINE (SYNTHROID, LEVOTHROID) 100 MCG TABLET    Take 100 mcg by mouth daily before breakfast.   LORAZEPAM (ATIVAN) 0.5 MG TABLET    0.5 mg at bedtime. And q6hr prn.   LOSARTAN (COZAAR) 100 MG TABLET    Take 1 tablet (100 mg total) by mouth daily.   METOPROLOL TARTRATE (LOPRESSOR) 25 MG TABLET    Take 3 tablets (75 mg total) by mouth 2 (two) times daily.   NITROGLYCERIN (NITROSTAT) 0.4 MG SL TABLET    Place 0.4 mg under the tongue every 5 (five) minutes as needed for chest pain.    POLYVINYL ALCOHOL (LIQUIFILM TEARS) 1.4 % OPHTHALMIC SOLUTION    Place 1 drop into both eyes 4 (four) times daily as needed for dry eyes.   POTASSIUM CHLORIDE (MICRO-K) 10 MEQ CR CAPSULE    Take 10 mEq by mouth 2 (two) times daily.   SKIN PROTECTANTS, MISC. (EUCERIN) CREAM    Apply 1 application topically daily as needed for dry skin (itching).   ZOLPIDEM  (AMBIEN) 10 MG TABLET    Take 2.5-5 mg by mouth at bedtime as needed for sleep.  Modified Medications   No medications on file  Discontinued Medications   No medications on file     Physical Exam: Physical Exam  Nursing note and vitals reviewed. Constitutional: He is oriented to person, place, and time. He appears well-developed and well-nourished. No distress.  HENT:  Severe hearing loss. Bilateral aides.Right facial droop. No pain during otoscope examination. R TM-small fluid. L impacted cerumen.   Eyes:  Corrective lenses. Increased exposure of the right cornea due to post CVA facial droop.  Neck: No JVD present. No tracheal deviation present. No thyromegaly present.  Cardiovascular: Normal rate, regular rhythm and normal heart sounds.   No murmur heard. Pulmonary/Chest: Effort normal and breath sounds normal. He has no wheezes. He has no rales.  Abdominal: Soft. Bowel sounds are normal. He exhibits no distension and no mass. There is no tenderness.  Musculoskeletal: Normal range of motion. He exhibits tenderness. He exhibits no edema.  Unstable gait. Using 4 wheel walker. Weaker on the right side. Left knee pain w/o swelling or redness or heat. Hx of the right total knee replacement.   Lymphadenopathy:    He has no cervical adenopathy.  Neurological: He is alert and oriented to person, place, and time. He displays normal reflexes. No cranial nerve deficit. He exhibits normal muscle tone. Coordination normal.  Slurred speech. Right side weakness.  Skin: Skin is warm and dry. No rash noted. No erythema. No pallor.  Left great toenail is growing out and is loose at the dista end. Had prior trauma.  Psychiatric: He has a normal mood and affect. His behavior is normal. Judgment and thought content normal.    Filed Vitals:   05/23/13 1401  BP: 148/82  Pulse: 64  Temp: 98 F (36.7 C)  TempSrc: Tympanic  Resp: 20      Labs reviewed: Basic Metabolic Panel:  Recent Labs   02/22/13 0943  03/27/13 1636 04/30/13 1424 04/30/13 1425 05/14/13 05/16/13  NA 130*  < > 134* 141  --  139  --   K 4.0  < > 4.7 4.2  --  4.5  --   CL 97  --  99 104  --   --   --   CO2 23  --  25 24  --   --   --   GLUCOSE 131*  --  107* 111*  --   --   --   BUN 14  < > 25* 28*  --  19  --   CREATININE 0.74  < > 1.18 1.11  --  1.1  --   CALCIUM 8.7  --  9.7 9.0  --   --   --   MG  --   --  2.3  --  2.2  --   --   TSH  --   --  6.812*  --   --  3.68 3.15  < > = values in this interval not displayed. Liver Function Tests:  Recent Labs  02/18/13 2025 03/27/13 1636 04/30/13 1424 05/14/13  AST 23 22 21 18   ALT 16 19 16 16   ALKPHOS 90 81 81 86  BILITOT 0.7 1.1 0.7  --   PROT 6.7 6.8 6.4  --   ALBUMIN 3.6 4.3 3.3*  --    CBC:  Recent Labs  11/08/12 1611 02/18/13 2025 02/18/13 2032 03/27/13 1636 04/30/13 1424  WBC 7.2 6.9  --  9.0 6.9  NEUTROABS 4.7 3.7  --   --  4.5  HGB 16.0 15.5 16.3 16.5 15.7  HCT 47.4 43.1 48.0 47.2 44.6  MCV 95.4 92.3  --  93.7 92.9  PLT 260.0 217  --  241 207   Lipid Panel:  Recent Labs  02/19/13 0355 03/26/13 05/01/13 0528  CHOL 187 89 117  HDL 34* 31* 30*  LDLCALC 117* 47 71  TRIG 181* 54 79  CHOLHDL 5.5  --  3.9     Past Procedures:   04/30/2013   CLINICAL DATA:  Stroke.  Ex-smoker.  EXAM: CHEST  2 VIEW  .  IMPRESSION: Minimal bibasilar linear atelectasis.     04/30/2013   CLINICAL DATA:  TRANSIENT ISCHEMIC ATTACK  EXAM: CT HEAD WITHOUT CONTRAST  IMPRESSION: Findings consistent with an area of subacute to chronic lacunar infarction left MCA distribution. Involutional and chronic changes also appreciated. There is no evidence of acute abnormalities.     05/01/2013   CLINICAL DATA:  80 year old male with previous stroke, right lower extremity weakness. Awoke with increased weakness today. History of atrial fibrillation, with [employer] pacemaker (monitored by [employer] representative and nursing staff for this exam with no complications  observed). Initial encounter.  EXAM: MRI HEAD WITHOUT CONTRAST  MRA HEAD WITHOUT CONTRAST  TECHNIQUE: Multiplanar, multiecho pulse sequences of the brain and surrounding structures were obtained without intravenous contrast. Angiographic images of the head were obtained using MRA technique without contrast.  COMPARISON:  Head CTs 04/30/2013 and earlier.  FINDINGS: MRI HEAD FINDINGS  Round/oval 15 mm area of diffusion signal abnormality in the left posterior corona radiata extending toward the insula (series 4, image 16, series 3, image 21) does show mild decreased diffusion on ADC. Associated T2 and FLAIR hyperintensity without mass effect or hemorrhage.  Additionally there are 3 other punctate areas of restricted diffusion identified, 1 in the posterior left parietal lobe, 1 in the left occipital pole, and 1 in the  contralateral right caudate nucleus (series 3 images 17 and).  Major intracranial vascular flow voids are preserved.  Patchy and confluent cerebral white matter T2 and FLAIR hyperintensity superimposed on the above findings. Chronic lacunar infarcts in the deep gray matter nuclei, most notably of the right thalamus. Chronic lacunar infarcts in the left cerebellar hemisphere. Mild T2 and FLAIR hyperintensity in the pons. Occasional chronic micro hemorrhages in the brain (right thalamus series 10, image 13).  Visible internal auditory structures appear normal. Negative pituitary, cervicomedullary junction and visualized cervical spine. Normal bone marrow signal. No ventriculomegaly. No midline shift, mass effect, or evidence of intracranial mass lesion. Stable cerebral volume.  Postoperative changes to the globes. Visualized paranasal sinuses and mastoids are clear. Visualized scalp soft tissues are within normal limits.  MRA HEAD FINDINGS  Antegrade flow in the posterior circulation. Dominant distal left vertebral artery. Irregularity of the distal right vertebral artery suggesting tandem stenosis. Still,  the right PICA origin remains patent. Left vertebral primarily supplies the basilar. Patent left PICA origin. Basilar artery irregularity but no definite stenosis. SCA and left PCA origins are normal. Fetal type right PCA origin. Normal left posterior communicating artery. Bilateral PCA branches within normal limits.  Antegrade flow in both carotid termini. Fusiform enlargement of the cavernous left ICA up to 9 mm diameter. There is also fusiform enlargement of the right ICA terminus, up to 6 mm diameter. Evidence of calcified ICA plaque bilaterally, but no definite ICA stenosis.  Patent carotid termini. Ophthalmic and posterior communicating artery origins are within normal limits. MCA and ACA origins are patent. Dominant left ACA A1 segment. Anterior communicating artery and visualized ACA branches are within normal limits. Left MCA branches are within normal limits.  Irregularity suggesting calcified plaque at the right ICA origin, but mild if any stenosis. Right MCA M1 segment remains patent. Visualized right MCA branches grossly normal.  IMPRESSION: 1. Small acute/subacute infarct left corona radiata. No mass effect or hemorrhage. 2. Three superimposed punctate acute lacunar infarcts (right caudate nucleus, left occipital pole, left left parietal lobe). These could reflect synchronous small vessel ischemia rather than a recent embolic event. 3. Underlying chronic small vessel ischemia. 4. Intracranial atherosclerosis. Fusiform aneurysmal enlargement of the left ICA (cavernous segment) and right ICA terminus. No anterior circulation major branch occlusion. 5. Atherosclerosis in stenosis of the distal right vertebral artery which appears hemodynamically significant. Other posterior circulation atherosclerosis without stenosis.   Electronically Signed   By: Lars Pinks M.D.   On: 05/01/2013 13:59   Mr Jodene Nam Head/brain Wo Cm  05/01/2013   CLINICAL DATA:  80 year old male with previous stroke, right lower extremity  weakness. Awoke with increased weakness today. History of atrial fibrillation, with [employer] pacemaker (monitored by [employer] representative and nursing staff for this exam with no complications observed). Initial encounter.  EXAM: MRI HEAD WITHOUT CONTRAST  MRA HEAD WITHOUT CONTRAST   IMPRESSION: 1. Small acute/subacute infarct left corona radiata. No mass effect or hemorrhage. 2. Three superimposed punctate acute lacunar infarcts (right caudate nucleus, left occipital pole, left left parietal lobe). These could reflect synchronous small vessel ischemia rather than a recent embolic event. 3. Underlying chronic small vessel ischemia. 4. Intracranial atherosclerosis. Fusiform aneurysmal enlargement of the left ICA (cavernous segment) and right ICA terminus. No anterior circulation major branch occlusion. 5. Atherosclerosis in stenosis of the distal right vertebral artery which appears hemodynamically significant. Other posterior circulation atherosclerosis without stenosis.     EKG Atrial fibrillation Right bundle branch block ST depr, consider  ischemia, anterolateral lds Baseline wander in lead(s) II III aVR aVL aVF V3   Assessment/Plan Hypothyroidism takes Levothyroxine 44mcg, TSH 3.597 01/08/13 and 3.153 05/16/13   Long term (current) use of anticoagulants Takes Eliquis 5mg  bid.   Nocturia Na 139 05/14/13  Pain in joint, lower leg Left knee-chronic-worse-no erythema or swelling-Tylenol 650mg  tid. Hx of the total right knee replacement.   Gout, unspecified No flare ups. Takes Allopurinol 50mg  daily.     Allergic rhinitis Vs vasomotor rhinitis since clear nasal drainage worsens at meals: prn Atovent nasal spray I spray each nostril ac meals. The left TM-small fluid, the right impacted cerumen. No pain during examination. Normal external ear canal. May consider Claritin.    Impacted cerumen of right ear SO to remove impacted the right ear wax.   Atrial fibrillation Cardiology plans  to admit patient in 4-6 weeks for a Tikosyn load. Presently his heart rate is controlled. Neurology and cardiology consulted. Liquids was increased to 5 mg twice daily. He was on coumadin until his stroke last December and was switched to apixaban 2.5 mg daily which he has been taking faithfully without reported side effects. Continue Metoprolol    Major depressive disorder, single episode, unspecified Difficulty falling asleep--takes Ativan almost routinely-occasional takes Ambien. Has more antsy and episodes of dizziness associated with hyperventilation. Lorazepam 0.5mg  hs prn and q6hr prn available to him.    Acute on chronic systolic congestive heart failure, NYHA class 2 able, remains eu volemic, takes Furosemide 20mg  daily, Bun/creat 19/1.11 05/14/13    Family/ Staff Communication: observe the patient.   Goals of Care: AL  Labs/tests ordered: none

## 2013-05-23 NOTE — Assessment & Plan Note (Signed)
Takes Eliquis 5mg  bid.

## 2013-05-24 ENCOUNTER — Encounter: Payer: Self-pay | Admitting: Internal Medicine

## 2013-05-24 DIAGNOSIS — R1312 Dysphagia, oropharyngeal phase: Secondary | ICD-10-CM | POA: Diagnosis not present

## 2013-05-24 DIAGNOSIS — M6281 Muscle weakness (generalized): Secondary | ICD-10-CM | POA: Diagnosis not present

## 2013-05-24 DIAGNOSIS — R269 Unspecified abnormalities of gait and mobility: Secondary | ICD-10-CM | POA: Diagnosis not present

## 2013-05-24 DIAGNOSIS — R29818 Other symptoms and signs involving the nervous system: Secondary | ICD-10-CM | POA: Diagnosis not present

## 2013-05-24 NOTE — Progress Notes (Signed)
OFFICE NOTE  Chief Complaint:  Routine followup  Primary Care Physician: [name], MD  HPI:  Jon [name]  is an 80 yo male formerly followed by Dr. Rex [name] and recently seen by Jon Kicks, NP, for left lower extremity edema with discoloration of his toes, was beginning to be uncomfortable for him to walk. We did venous Dopplers. He was quite concerned about arterial blood supply. His mother ended up with bilateral amputations. Venous Dopplers showed a ruptured baker cyst, no DVT. He was instructed on this and since that time his symptoms have resolved completely. He is on Coumadin for paroxysmal A-fib which led to the discoloration in his toes. Additionally, he has a history of abdominal aortic aneurysm and because he was concerned about his arterial disease, if he had it, we did Dopplers.  He is here for the results of the Dopplers. Bilateral ABIs were 1.0, though he does have appearance of an occluded right anterior tibial artery and a left posterior tib demonstrated a short segment of occlusive disease with reconstitution at the ankle. His pulses have been 2+. He has no claudication symptoms. Additionally, the duplex of his abdominal aorta was slightly, minimally changed from his last one. Previously it was 3.95 x 3.57, now he is 3.7 x 4.0, essentially the same, very slight increase. He has no abdominal pain and no other complaints. He has really no complaints today, feels quite well. He is not aware of any tachycardias or palpitations. He has a [employer] pacemaker which was interrogated today. This demonstrates a battery voltage of 3.0V.  His a-fib burden is 1.6% and he is on amiodarone.  Other history includes bypass grafting in 1989; vein graft was stented in 2008. Last stress test was 2012, low risk study. EF was 49%. Pacemaker was placed for paroxysmal A-fib and bradycardia, sick sinus syndrome in 2011. He is also on warfarin.   Jon [name] underwent PFTs on 10/22/2012. This  showed a moderately severe restrictive limitation as well as moderately reduced diffusion capacity. Based on these findings I recommended discontinuing his amiodarone and he also stopped his pravastatin. Since that time he's noted that he feels quite a bit better including some minor improvement in his knee pain. He still feels like his left knee is bothering him and he is status post right TKR. 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 I did refer him to the [facility], but has not been contacted for appointment so far.  Since his last followup, Jon [name] was seen by Dr. Sallyanne [name] for a device check in his device appeared to be working properly. He was having atrial fibrillation, but a little burden. He continued on warfarin which was generally therapeutic. Unfortunately, in December he had a thalamic stroke which resulted in some right facial droop and hemiparesis. That is improving , but slowly. He was then changed to Eliquis for anticoagulation. He was also started on Zetia, as he has a history of statin intolerance in the past.  An echo performed in the hospital demonstrated an EF of 40-45%, which is mildly reduced from previous studies. In addition, he has had a small amount of weight gain and lower extremity swelling which is noted over the past several weeks while in recovery.  Unfortunately, he suffered another stroke last month around the time that we plan to admit him for tikosyn induction. He is still trying to recover from that. Today he reports he's had some increasing  shortness of breath and worsening lower extremity swelling. His EKG shows persistent atrial fibrillation with intermittent pacing.  PMHx:  Past Medical History  Diagnosis Date  . Hypertension   . Coronary artery disease   . Arthritis   . Cataract   . Anxiety   . Blood transfusion without reported diagnosis   . Hyperlipidemia   . Heart murmur   . Allergy   . Thyroid disease   . PAF (paroxysmal  atrial fibrillation)     coumadin  . History of abdominal aortic aneurysm   . History of echocardiogram 12/2008    EF >55%; mild concentric LVH; mild MR; mild-mod TR; mild AV regurg;   . History of nuclear stress test 12/2010    lexiscan; low risk; compared to prior study, perfusion improved  . History of Doppler ultrasound 05/22/2012    LEAs; R anterior tibial artery appeared occluded; L posterior tibial shows short segment of occlusive ds  . History of Doppler ultrasound 05/22/2012    Abdominal Aortic Doppler; slight increase in fusiform aneurysm   . Stroke     Past Surgical History  Procedure Laterality Date  . Coronary artery bypass graft  1989  . Pacemaker insertion  2011  . Insert / replace / remove pacemaker    . Eye surgery    . Joint replacement    . Hernia repair    . Coronary angioplasty with stent placement  2008    stent to SVG to OM    FAMHx:  Family History  Problem Relation Age of Onset  . Diabetes Father   . Other Mother     PAD with amputations    SOCHx:   reports that he quit smoking about 46 years ago. His smoking use included Cigarettes. He has a 11.25 pack-year smoking history. He has never used smokeless tobacco. He reports that he does not drink alcohol or use illicit drugs.  ALLERGIES:  Allergies  Allergen Reactions  . Caduet [Amlodipine-Atorvastatin] Other (See Comments)    Weakness   . Crestor [Rosuvastatin] Other (See Comments)    Muscle pain/weakness  . Cymbalta [Duloxetine Hcl] Other (See Comments)    Excessive sedation  . Lipitor [Atorvastatin Calcium] Other (See Comments)    Muscle pain/weakness  . Lisinopril Other (See Comments)    Doesn't remember reaction  . Simvastatin Other (See Comments)    Muscle pain/weakness    ROS: A comprehensive review of systems was negative except for: Constitutional: positive for fatigue Respiratory: positive for dyspnea on exertion Cardiovascular: positive for irregular heart beat and lower  extremity edema  HOME MEDS: Current Outpatient Prescriptions  Medication Sig Dispense Refill  . acetaminophen (TYLENOL) 325 MG tablet Take 2 tablets (650 mg total) by mouth 2 (two) times daily. DO NOT EXCEED 4 TABLETS DAILY  360 tablet  1  . allopurinol (ZYLOPRIM) 100 MG tablet Take 50 mg by mouth daily.       Marland Kitchen apixaban (ELIQUIS) 5 MG TABS tablet Take 1 tablet (5 mg total) by mouth 2 (two) times daily.  60 tablet  1  . Cholecalciferol (VITAMIN D) 400 UNITS capsule Take 400 Units by mouth 2 (two) times daily.       . furosemide (LASIX) 20 MG tablet Take 1 tablet (20 mg total) by mouth 2 (two) times daily.  180 tablet  1  . hydrOXYzine (ATARAX/VISTARIL) 10 MG tablet Take 10 mg by mouth 4 (four) times daily -  with meals and at bedtime.      Marland Kitchen  ipratropium (ATROVENT) 0.03 % nasal spray Place 1 spray into both nostrils 3 (three) times daily as needed (post nasal drip).       Marland Kitchen levothyroxine (SYNTHROID, LEVOTHROID) 100 MCG tablet Take 100 mcg by mouth daily before breakfast.      . LORazepam (ATIVAN) 0.5 MG tablet 0.5 mg at bedtime. And q6hr prn.      . losartan (COZAAR) 100 MG tablet Take 1 tablet (100 mg total) by mouth daily.  30 tablet  5  . metoprolol tartrate (LOPRESSOR) 25 MG tablet Take 3 tablets (75 mg total) by mouth 2 (two) times daily.  120 tablet  0  . nitroGLYCERIN (NITROSTAT) 0.4 MG SL tablet Place 0.4 mg under the tongue every 5 (five) minutes as needed for chest pain.       . polyvinyl alcohol (LIQUIFILM TEARS) 1.4 % ophthalmic solution Place 1 drop into both eyes 4 (four) times daily as needed for dry eyes.      . potassium chloride (MICRO-K) 10 MEQ CR capsule Take 10 mEq by mouth 2 (two) times daily.      . Skin Protectants, Misc. (EUCERIN) cream Apply 1 application topically daily as needed for dry skin (itching).      . zolpidem (AMBIEN) 10 MG tablet Take 2.5-5 mg by mouth at bedtime as needed for sleep.       No current facility-administered medications for this visit.     LABS/IMAGING: No results found for this or any previous visit (from the past 48 hour(s)). No results found.  VITALS: BP 122/86  Pulse 68  Ht 5\' 8"  (1.727 m)  Wt 163 lb 12.8 oz (74.299 kg)  BMI 24.91 kg/m2  EXAM: GEN: Awake, NAD HEENT: PERRLA, right lid appears retracted, right eye proptotic PULM: lungs with decreased BS at bases CV: Regular rate and rhythm, s1/s2 GI: Abdomen soft, non-tender, +BS VASCULAR: 2+ pulses, right CEA incision noted NEURO: right facial droop, slightly slurred speech PSYCH: Mood, affect normal, pleasant MSK: good ROM, strength 5/5 LE bilaterally, right handgrip 4/5 DERM: chronic LE venous stasis changes, bilateral 1+ LE Edema  EKG: A-fib, intermittent pacing at 68  ASSESSMENT: 1. Acute on chronic systolic, congestive heart failure, NYHA Class III symptoms 2. Persistent atrial fibrillation 3. Sick sinus syndrome status post pacemaker placement with normal function 4. Abnormal PFT's - off of amiodarone 5. History of abdominal aortic aneurysm with mild enlargement 6. Coronary artery disease status post CABG in 1989 with stent to the vein graft in 2008 7. Minimal PAD with normal ABIs bilaterally 8. Recent thalamic stroke - now on Eliquis 9. Acute on chronic, systolic congestive heart failure - EF 40-45%  PLAN: 1.   Jon [name] has a number of active medical problems, including what appears to be some mild acute on chronic systolic congestive heart failure. His EF has worsened slightly to 40-45%. Unfortunately he has had 2 recent strokes. The first occurred and he was switched to Eliquis 2.5 mg BID. He had a second stroke and his Eliquis dose was increased to 5 mg twice a day. So far he has not had a reoccurrence but is in persistent atrial fibrillation. His QTC interval today is 523 ms, which precludes the use of Tikosyn Is not clear that being in sinus rhythm may actually be beneficial for him although it may help his heart failure somewhat. He was  intolerant in the past to amiodarone. At this point I think the risk of trying to obtain a sinus rhythm is greater  than maintaining atrial fibrillation.  I would recommend an increase in his diuretics to Lasix 20 mg twice daily. We will go ahead and check a BMP and BNP in plan to see him back next week to follow his weights closely. If his weight continues to go up and does not respond to diuretics, he may need admission for IV diuretics.  Pixie Casino, MD, [facility] Attending Cardiologist [facility] 05/24/2013, 7:52 AM

## 2013-05-27 DIAGNOSIS — R269 Unspecified abnormalities of gait and mobility: Secondary | ICD-10-CM | POA: Diagnosis not present

## 2013-05-27 DIAGNOSIS — R29818 Other symptoms and signs involving the nervous system: Secondary | ICD-10-CM | POA: Diagnosis not present

## 2013-05-27 DIAGNOSIS — M6281 Muscle weakness (generalized): Secondary | ICD-10-CM | POA: Diagnosis not present

## 2013-05-27 DIAGNOSIS — R1312 Dysphagia, oropharyngeal phase: Secondary | ICD-10-CM | POA: Diagnosis not present

## 2013-05-28 DIAGNOSIS — R0602 Shortness of breath: Secondary | ICD-10-CM | POA: Diagnosis not present

## 2013-05-28 DIAGNOSIS — Z79899 Other long term (current) drug therapy: Secondary | ICD-10-CM | POA: Diagnosis not present

## 2013-05-28 LAB — BASIC METABOLIC PANEL
BUN: 25 mg/dL — AB (ref 4–21)
Creatinine: 1.1 mg/dL (ref 0.6–1.3)
Glucose: 101 mg/dL
Potassium: 4 mmol/L (ref 3.4–5.3)
SODIUM: 140 mmol/L (ref 137–147)

## 2013-05-30 ENCOUNTER — Encounter: Payer: Self-pay | Admitting: Nurse Practitioner

## 2013-05-30 ENCOUNTER — Non-Acute Institutional Stay: Payer: Medicare Other | Admitting: Nurse Practitioner

## 2013-05-30 VITALS — BP 130/68 | HR 60 | Wt 160.0 lb

## 2013-05-30 DIAGNOSIS — N401 Enlarged prostate with lower urinary tract symptoms: Secondary | ICD-10-CM

## 2013-05-30 DIAGNOSIS — IMO0002 Reserved for concepts with insufficient information to code with codable children: Secondary | ICD-10-CM

## 2013-05-30 DIAGNOSIS — I2510 Atherosclerotic heart disease of native coronary artery without angina pectoris: Secondary | ICD-10-CM

## 2013-05-30 DIAGNOSIS — F329 Major depressive disorder, single episode, unspecified: Secondary | ICD-10-CM

## 2013-05-30 DIAGNOSIS — I5023 Acute on chronic systolic (congestive) heart failure: Secondary | ICD-10-CM

## 2013-05-30 DIAGNOSIS — M25569 Pain in unspecified knee: Secondary | ICD-10-CM

## 2013-05-30 DIAGNOSIS — E039 Hypothyroidism, unspecified: Secondary | ICD-10-CM

## 2013-05-30 DIAGNOSIS — R609 Edema, unspecified: Secondary | ICD-10-CM | POA: Diagnosis not present

## 2013-05-30 DIAGNOSIS — R1312 Dysphagia, oropharyngeal phase: Secondary | ICD-10-CM | POA: Diagnosis not present

## 2013-05-30 DIAGNOSIS — I4891 Unspecified atrial fibrillation: Secondary | ICD-10-CM

## 2013-05-30 DIAGNOSIS — I10 Essential (primary) hypertension: Secondary | ICD-10-CM

## 2013-05-30 DIAGNOSIS — I6350 Cerebral infarction due to unspecified occlusion or stenosis of unspecified cerebral artery: Secondary | ICD-10-CM

## 2013-05-30 DIAGNOSIS — I639 Cerebral infarction, unspecified: Secondary | ICD-10-CM

## 2013-05-30 DIAGNOSIS — I509 Heart failure, unspecified: Secondary | ICD-10-CM

## 2013-05-30 DIAGNOSIS — M6281 Muscle weakness (generalized): Secondary | ICD-10-CM

## 2013-05-30 DIAGNOSIS — Z7901 Long term (current) use of anticoagulants: Secondary | ICD-10-CM

## 2013-05-30 DIAGNOSIS — F3289 Other specified depressive episodes: Secondary | ICD-10-CM

## 2013-05-30 DIAGNOSIS — R29818 Other symptoms and signs involving the nervous system: Secondary | ICD-10-CM | POA: Diagnosis not present

## 2013-05-30 DIAGNOSIS — R269 Unspecified abnormalities of gait and mobility: Secondary | ICD-10-CM | POA: Diagnosis not present

## 2013-05-30 DIAGNOSIS — N138 Other obstructive and reflux uropathy: Secondary | ICD-10-CM

## 2013-05-31 ENCOUNTER — Encounter: Payer: Self-pay | Admitting: Internal Medicine

## 2013-06-01 DIAGNOSIS — R609 Edema, unspecified: Secondary | ICD-10-CM | POA: Insufficient documentation

## 2013-06-01 NOTE — Assessment & Plan Note (Signed)
Generalized weakness and poor balance since his recent CVA x2-w/c for mobility most of time.

## 2013-06-01 NOTE — Assessment & Plan Note (Signed)
Left knee-chronic-worse-no erythema or swelling-Tylenol 650mg  tid. Hx of the total right knee replacement. W/c for mobility.

## 2013-06-01 NOTE — Progress Notes (Signed)
Patient ID: Jon [name], male   DOB: 04-May-1921, 80 y.o.   MRN: [id number]   Code Status: DNR  Allergies  Allergen Reactions  . Caduet [Amlodipine-Atorvastatin] Other (See Comments)    Weakness   . Crestor [Rosuvastatin] Other (See Comments)    Muscle pain/weakness  . Cymbalta [Duloxetine Hcl] Other (See Comments)    Excessive sedation  . Lipitor [Atorvastatin Calcium] Other (See Comments)    Muscle pain/weakness  . Lisinopril Other (See Comments)    Doesn't remember reaction  . Simvastatin Other (See Comments)    Muscle pain/weakness    Chief Complaint  Patient presents with  . multiple concerns    balance off, left knee hurt the other day had to get wheel chair, better today; right ankle swollen, SOB most of the time. Last night nurse woke him up, because he was making sounds in his sleep (Hx of CPAP), sleeply most of the time    HPI: Patient is a 80 y.o. male seen in the clinic  at [facility] today for evaluation of SOB, right ankle edema, left knee pain, poor balance, late effect CVA and other chronic medical conditions.   Hospitalized from 04/30/2013 to 05/03/2013 for RLE weakness which lasted about 30 minutes. TIA was considered given nature of the problem and CT brain findings/MRI and MRA brain. He was discharged with increased Eliquis and adjusting of antihypertensive medications.   Problem List Items Addressed This Visit   Atrial fibrillation (Chronic)     Takes Tikosyn since 05/23/13. Presently his heart rate is controlled.       HTN (hypertension) (Chronic)     Metoprolol 75 mg twice a day for better blood pressure control.      Hypothyroidism (Chronic)     takes Levothyroxine 45mcg, TSH 3.597 01/08/13 and 3.153 05/16/13      Long term (current) use of anticoagulants (Chronic)     Eliquis     CVA - Rt brain stroke 12/14 (Chronic)     s/p right CVA,ischemic stroke Dec 2014 with residual dysarthria and the right  face droop,    Pain in joint,  lower leg - Primary     Left knee-chronic-worse-no erythema or swelling-Tylenol 650mg  tid. Hx of the total right knee replacement. W/c for mobility.      Muscle weakness (generalized)     Generalized weakness and poor balance since his recent CVA x2-w/c for mobility most of time.     Acute on chronic systolic congestive heart failure, NYHA class 2     Hx of CABG, pacemaker, A-fibable, c/o SOB-wants to know if O2 will help-SatO2 99% RA today, but he stated he was yelling in his dream last night in setting of CAPA use in the past-will check O2Sat q shift to determine his needs of O2.  Also his right ankle edema is more prominent today. Furosemide was increased to 20mg  bid now, Bun/creat 19/1.11 05/14/13. F/u Cardiology 06/05/13. Observe the patient.      Hypertrophy of prostate with urinary obstruction and other lower urinary tract symptoms (LUTS)     Have noticed more incontinent episodes especially after Furosemide was increased. He participates Kegel excise and uses adult pad.     Depression due to stroke     difficulty falling asleep--takes Ativan almost routinely-occasional takes Ambien. Has more antsy and episodes of dizziness associated with hyperventilation. Lorazepam 0.5mg  hs prn and q6hr prn available to him.       Edema     Most of  swelling is localized in the lateral right ankle area.        Review of Systems:  Review of Systems  Constitutional: Positive for malaise/fatigue. Negative for fever, chills, weight loss and diaphoresis.  HENT: Positive for hearing loss. Negative for congestion, ear discharge, ear pain, nosebleeds, sore throat and tinnitus.   Eyes: Negative for blurred vision, double vision, photophobia, pain, discharge and redness.       Dry eyes. Especially the right eye since its incomplete closure since the CVA  Respiratory: Positive for shortness of breath. Negative for cough, hemoptysis, sputum production, wheezing and stridor.        C/o more SOB-desires O2 Sat  check.   Cardiovascular: Positive for leg swelling and PND. Negative for chest pain, palpitations, orthopnea and claudication.       Most prominent the right lateral ankle.   Gastrointestinal: Negative for heartburn, nausea, abdominal pain, diarrhea, constipation and blood in stool.  Genitourinary: Positive for urgency and frequency. Negative for dysuria, hematuria and flank pain.       Cannot make it to commode sometimes. Kegel exercise and adult depends.   Musculoskeletal: Positive for joint pain. Negative for back pain, falls, myalgias and neck pain.       Left knee pain.   Skin: Positive for itching. Negative for rash.       Chronic, Benadryl available to him.   Neurological: Positive for speech change and focal weakness. Negative for dizziness, tingling, tremors, sensory change, seizures, loss of consciousness, weakness and headaches.       Mild R facial droop and right sided weakness. Grip strength 5/5-but weaker than the left. Unsteady gait. Aphasia is better.   Endo/Heme/Allergies: Positive for environmental allergies. Negative for polydipsia. Does not bruise/bleed easily.  Psychiatric/Behavioral: Positive for depression and memory loss. Negative for suicidal ideas, hallucinations and substance abuse. The patient is nervous/anxious and has insomnia.      Past Medical History  Diagnosis Date  . Hypertension   . Coronary artery disease   . Arthritis   . Cataract   . Anxiety   . Blood transfusion without reported diagnosis   . Hyperlipidemia   . Heart murmur   . Allergy   . Thyroid disease   . PAF (paroxysmal atrial fibrillation)     coumadin  . History of abdominal aortic aneurysm   . History of echocardiogram 12/2008    EF >55%; mild concentric LVH; mild MR; mild-mod TR; mild AV regurg;   . History of nuclear stress test 12/2010    lexiscan; low risk; compared to prior study, perfusion improved  . History of Doppler ultrasound 05/22/2012    LEAs; R anterior tibial artery  appeared occluded; L posterior tibial shows short segment of occlusive ds  . History of Doppler ultrasound 05/22/2012    Abdominal Aortic Doppler; slight increase in fusiform aneurysm   . Stroke    Past Surgical History  Procedure Laterality Date  . Coronary artery bypass graft  1989  . Pacemaker insertion  2011  . Insert / replace / remove pacemaker    . Eye surgery    . Joint replacement    . Hernia repair    . Coronary angioplasty with stent placement  2008    stent to SVG to OM   Social History:   reports that he quit smoking about 46 years ago. His smoking use included Cigarettes. He has a 11.25 pack-year smoking history. He has never used smokeless tobacco. He reports that he does  not drink alcohol or use illicit drugs.  Family History  Problem Relation Age of Onset  . Diabetes Father   . Other Mother     PAD with amputations    Medications: Patient's Medications  New Prescriptions   No medications on file  Previous Medications   ACETAMINOPHEN (TYLENOL) 325 MG TABLET    Take 2 tablets (650 mg total) by mouth 2 (two) times daily. DO NOT EXCEED 4 TABLETS DAILY   ALLOPURINOL (ZYLOPRIM) 100 MG TABLET    Take 50 mg by mouth daily.    AMOXICILLIN (AMOXIL) 500 MG CAPSULE    Take 500 mg by mouth. Take 4 caps prior to dential   APIXABAN (ELIQUIS) 5 MG TABS TABLET    Take 1 tablet (5 mg total) by mouth 2 (two) times daily.   CHOLECALCIFEROL (VITAMIN D) 400 UNITS CAPSULE    Take 400 Units by mouth 2 (two) times daily.    FUROSEMIDE (LASIX) 20 MG TABLET    Take 1 tablet (20 mg total) by mouth 2 (two) times daily.   HYDROXYZINE (ATARAX/VISTARIL) 10 MG TABLET    Take 10 mg by mouth 4 (four) times daily -  with meals and at bedtime.   IPRATROPIUM (ATROVENT) 0.03 % NASAL SPRAY    Place 1 spray into both nostrils 3 (three) times daily as needed (post nasal drip).    LEVOTHYROXINE (SYNTHROID, LEVOTHROID) 100 MCG TABLET    Take 100 mcg by mouth daily before breakfast.   LORAZEPAM (ATIVAN)  0.5 MG TABLET    0.5 mg at bedtime. And q6hr prn.   LOSARTAN (COZAAR) 100 MG TABLET    Take 1 tablet (100 mg total) by mouth daily.   METOPROLOL TARTRATE (LOPRESSOR) 25 MG TABLET    Take 3 tablets (75 mg total) by mouth 2 (two) times daily.   NITROGLYCERIN (NITROSTAT) 0.4 MG SL TABLET    Place 0.4 mg under the tongue every 5 (five) minutes as needed for chest pain.    POLYVINYL ALCOHOL (LIQUIFILM TEARS) 1.4 % OPHTHALMIC SOLUTION    Place 1 drop into both eyes 4 (four) times daily as needed for dry eyes.   POTASSIUM CHLORIDE (MICRO-K) 10 MEQ CR CAPSULE    Take 10 mEq by mouth 2 (two) times daily.   SKIN PROTECTANTS, MISC. (EUCERIN) CREAM    Apply 1 application topically daily as needed for dry skin (itching).   ZOLPIDEM (AMBIEN) 10 MG TABLET    Take 2.5-5 mg by mouth at bedtime as needed for sleep.  Modified Medications   No medications on file  Discontinued Medications   No medications on file   Physical Exam: Physical Exam  Nursing note and vitals reviewed. Constitutional: He is oriented to person, place, and time. He appears well-developed and well-nourished. No distress.  W/c for generalized weakness and left knee pain   HENT:  Severe hearing loss. Bilateral aides.Right facial droop. No pain during otoscope examination.   Eyes:  Corrective lenses. Increased exposure of the right cornea due to post CVA facial droop.  Neck: No JVD present. No tracheal deviation present. No thyromegaly present.  Cardiovascular: Normal rate, regular rhythm and normal heart sounds.   No murmur heard. Pulmonary/Chest: Effort normal. He has no wheezes. He has rales.  Moist rales posterior left mid-lower lung and the right base.   Abdominal: Soft. Bowel sounds are normal. He exhibits no distension and no mass. There is no tenderness.  Musculoskeletal: Normal range of motion. He exhibits edema and tenderness.  Unstable gait. Using 4 wheel walker and w/c. Weaker on the right side. Left knee pain w/o swelling or  redness or heat. Hx of the right total knee replacement. Edema noted at the lateral right ankle.   Lymphadenopathy:    He has no cervical adenopathy.  Neurological: He is alert and oriented to person, place, and time. He displays normal reflexes. No cranial nerve deficit. He exhibits normal muscle tone. Coordination normal.  Slurred speech. Right side weakness.  Skin: Skin is warm and dry. No rash noted. No erythema. No pallor.  Left great toenail is growing out and is loose at the dista end. Had prior trauma.  Psychiatric: He has a normal mood and affect. His behavior is normal. Judgment and thought content normal.   Filed Vitals:   05/30/13 1649  BP: 130/68  Pulse: 60  Weight: 160 lb (72.576 kg)  SpO2: 99%   Labs reviewed: Basic Metabolic Panel:  Recent Labs  02/22/13 0943  03/27/13 1636 04/30/13 1424 04/30/13 1425 05/14/13 05/16/13  NA 130*  < > 134* 141  --  139  --   K 4.0  < > 4.7 4.2  --  4.5  --   CL 97  --  99 104  --   --   --   CO2 23  --  25 24  --   --   --   GLUCOSE 131*  --  107* 111*  --   --   --   BUN 14  < > 25* 28*  --  19  --   CREATININE 0.74  < > 1.18 1.11  --  1.1  --   CALCIUM 8.7  --  9.7 9.0  --   --   --   MG  --   --  2.3  --  2.2  --   --   TSH  --   --  6.812*  --   --  3.68 3.15  < > = values in this interval not displayed. Liver Function Tests:  Recent Labs  02/18/13 2025 03/27/13 1636 04/30/13 1424 05/14/13  AST 23 22 21 18   ALT 16 19 16 16   ALKPHOS 90 81 81 86  BILITOT 0.7 1.1 0.7  --   PROT 6.7 6.8 6.4  --   ALBUMIN 3.6 4.3 3.3*  --    CBC:  Recent Labs  11/08/12 1611 02/18/13 2025 02/18/13 2032 03/27/13 1636 04/30/13 1424  WBC 7.2 6.9  --  9.0 6.9  NEUTROABS 4.7 3.7  --   --  4.5  HGB 16.0 15.5 16.3 16.5 15.7  HCT 47.4 43.1 48.0 47.2 44.6  MCV 95.4 92.3  --  93.7 92.9  PLT 260.0 217  --  241 207   Lipid Panel:  Recent Labs  02/19/13 0355 03/26/13 05/01/13 0528  CHOL 187 89 117  HDL 34* 31* 30*  LDLCALC 117*  47 71  TRIG 181* 54 79  CHOLHDL 5.5  --  3.9   Past Procedures:   04/30/2013   CLINICAL DATA:  Stroke.  Ex-smoker.  EXAM: CHEST  2 VIEW  .  IMPRESSION: Minimal bibasilar linear atelectasis.     04/30/2013   CLINICAL DATA:  TRANSIENT ISCHEMIC ATTACK  EXAM: CT HEAD WITHOUT CONTRAST  IMPRESSION: Findings consistent with an area of subacute to chronic lacunar infarction left MCA distribution. Involutional and chronic changes also appreciated. There is no evidence of acute abnormalities.     05/01/2013   CLINICAL  DATA:  80 year old male with previous stroke, right lower extremity weakness. Awoke with increased weakness today. History of atrial fibrillation, with [employer] pacemaker (monitored by [employer] representative and nursing staff for this exam with no complications observed). Initial encounter.  EXAM: MRI HEAD WITHOUT CONTRAST  MRA HEAD WITHOUT CONTRAST  TECHNIQUE: Multiplanar, multiecho pulse sequences of the brain and surrounding structures were obtained without intravenous contrast. Angiographic images of the head were obtained using MRA technique without contrast.  COMPARISON:  Head CTs 04/30/2013 and earlier.  FINDINGS: MRI HEAD FINDINGS  Round/oval 15 mm area of diffusion signal abnormality in the left posterior corona radiata extending toward the insula (series 4, image 16, series 3, image 21) does show mild decreased diffusion on ADC. Associated T2 and FLAIR hyperintensity without mass effect or hemorrhage.  Additionally there are 3 other punctate areas of restricted diffusion identified, 1 in the posterior left parietal lobe, 1 in the left occipital pole, and 1 in the contralateral right caudate nucleus (series 3 images 17 and).  Major intracranial vascular flow voids are preserved.  Patchy and confluent cerebral white matter T2 and FLAIR hyperintensity superimposed on the above findings. Chronic lacunar infarcts in the deep gray matter nuclei, most notably of the right thalamus. Chronic lacunar  infarcts in the left cerebellar hemisphere. Mild T2 and FLAIR hyperintensity in the pons. Occasional chronic micro hemorrhages in the brain (right thalamus series 10, image 13).  Visible internal auditory structures appear normal. Negative pituitary, cervicomedullary junction and visualized cervical spine. Normal bone marrow signal. No ventriculomegaly. No midline shift, mass effect, or evidence of intracranial mass lesion. Stable cerebral volume.  Postoperative changes to the globes. Visualized paranasal sinuses and mastoids are clear. Visualized scalp soft tissues are within normal limits.  MRA HEAD FINDINGS  Antegrade flow in the posterior circulation. Dominant distal left vertebral artery. Irregularity of the distal right vertebral artery suggesting tandem stenosis. Still, the right PICA origin remains patent. Left vertebral primarily supplies the basilar. Patent left PICA origin. Basilar artery irregularity but no definite stenosis. SCA and left PCA origins are normal. Fetal type right PCA origin. Normal left posterior communicating artery. Bilateral PCA branches within normal limits.  Antegrade flow in both carotid termini. Fusiform enlargement of the cavernous left ICA up to 9 mm diameter. There is also fusiform enlargement of the right ICA terminus, up to 6 mm diameter. Evidence of calcified ICA plaque bilaterally, but no definite ICA stenosis.  Patent carotid termini. Ophthalmic and posterior communicating artery origins are within normal limits. MCA and ACA origins are patent. Dominant left ACA A1 segment. Anterior communicating artery and visualized ACA branches are within normal limits. Left MCA branches are within normal limits.  Irregularity suggesting calcified plaque at the right ICA origin, but mild if any stenosis. Right MCA M1 segment remains patent. Visualized right MCA branches grossly normal.  IMPRESSION: 1. Small acute/subacute infarct left corona radiata. No mass effect or hemorrhage. 2.  Three superimposed punctate acute lacunar infarcts (right caudate nucleus, left occipital pole, left left parietal lobe). These could reflect synchronous small vessel ischemia rather than a recent embolic event. 3. Underlying chronic small vessel ischemia. 4. Intracranial atherosclerosis. Fusiform aneurysmal enlargement of the left ICA (cavernous segment) and right ICA terminus. No anterior circulation major branch occlusion. 5. Atherosclerosis in stenosis of the distal right vertebral artery which appears hemodynamically significant. Other posterior circulation atherosclerosis without stenosis.   Electronically Signed   By: Lars Pinks M.D.   On: 05/01/2013 13:59   Mr Jodene Nam  Head/brain Wo Cm  05/01/2013   CLINICAL DATA:  80 year old male with previous stroke, right lower extremity weakness. Awoke with increased weakness today. History of atrial fibrillation, with [employer] pacemaker (monitored by [employer] representative and nursing staff for this exam with no complications observed). Initial encounter.  EXAM: MRI HEAD WITHOUT CONTRAST  MRA HEAD WITHOUT CONTRAST   IMPRESSION: 1. Small acute/subacute infarct left corona radiata. No mass effect or hemorrhage. 2. Three superimposed punctate acute lacunar infarcts (right caudate nucleus, left occipital pole, left left parietal lobe). These could reflect synchronous small vessel ischemia rather than a recent embolic event. 3. Underlying chronic small vessel ischemia. 4. Intracranial atherosclerosis. Fusiform aneurysmal enlargement of the left ICA (cavernous segment) and right ICA terminus. No anterior circulation major branch occlusion. 5. Atherosclerosis in stenosis of the distal right vertebral artery which appears hemodynamically significant. Other posterior circulation atherosclerosis without stenosis.     EKG Atrial fibrillation Right bundle branch block ST depr, consider ischemia, anterolateral lds Baseline wander in lead(s) II III aVR aVL aVF  V3   Assessment/Plan Pain in joint, lower leg Left knee-chronic-worse-no erythema or swelling-Tylenol 650mg  tid. Hx of the total right knee replacement. W/c for mobility.    Acute on chronic systolic congestive heart failure, NYHA class 2 Hx of CABG, pacemaker, A-fibable, c/o SOB-wants to know if O2 will help-SatO2 99% RA today, but he stated he was yelling in his dream last night in setting of CAPA use in the past-will check O2Sat q shift to determine his needs of O2.  Also his right ankle edema is more prominent today. Furosemide was increased to 20mg  bid now, Bun/creat 19/1.11 05/14/13. F/u Cardiology 06/05/13. Observe the patient.    CVA - Rt brain stroke 12/14 s/p right CVA,ischemic stroke Dec 2014 with residual dysarthria and the right  face droop,  HTN (hypertension) Metoprolol 75 mg twice a day for better blood pressure control.    Long term (current) use of anticoagulants Eliquis   Atrial fibrillation Takes Tikosyn since 05/23/13. Presently his heart rate is controlled.     Edema Most of swelling is localized in the lateral right ankle area.   Hypertrophy of prostate with urinary obstruction and other lower urinary tract symptoms (LUTS) Have noticed more incontinent episodes especially after Furosemide was increased. He participates Kegel excise and uses adult pad.   Muscle weakness (generalized) Generalized weakness and poor balance since his recent CVA x2-w/c for mobility most of time.   Hypothyroidism takes Levothyroxine 62mcg, TSH 3.597 01/08/13 and 3.153 05/16/13    Depression due to stroke difficulty falling asleep--takes Ativan almost routinely-occasional takes Ambien. Has more antsy and episodes of dizziness associated with hyperventilation. Lorazepam 0.5mg  hs prn and q6hr prn available to him.       Family/ Staff Communication: observe the patient.   Goals of Care: AL  Labs/tests ordered: none

## 2013-06-01 NOTE — Assessment & Plan Note (Signed)
Metoprolol 75 mg twice a day for better blood pressure control.

## 2013-06-01 NOTE — Assessment & Plan Note (Signed)
takes Levothyroxine 75mcg, TSH 3.597 01/08/13 and 3.153 05/16/13  

## 2013-06-01 NOTE — Assessment & Plan Note (Addendum)
Hx of CABG, pacemaker, A-fibable, c/o SOB-wants to know if O2 will help-SatO2 99% RA today, but he stated he was yelling in his dream last night in setting of CAPA use in the past-will check O2Sat q shift to determine his needs of O2.  Also his right ankle edema is more prominent today. Furosemide was increased to 20mg  bid now, Bun/creat 19/1.11 05/14/13. F/u Cardiology 06/05/13. Observe the patient.

## 2013-06-01 NOTE — Assessment & Plan Note (Signed)
Have noticed more incontinent episodes especially after Furosemide was increased. He participates Kegel excise and uses adult pad.   

## 2013-06-01 NOTE — Assessment & Plan Note (Signed)
difficulty falling asleep--takes Ativan almost routinely-occasional takes Ambien. Has more antsy and episodes of dizziness associated with hyperventilation. Lorazepam 0.5mg  hs prn and q6hr prn available to him.

## 2013-06-01 NOTE — Assessment & Plan Note (Signed)
s/p right CVA,ischemic stroke Dec 2014 with residual dysarthria and the right  face droop,

## 2013-06-01 NOTE — Assessment & Plan Note (Signed)
Eliquis 

## 2013-06-01 NOTE — Assessment & Plan Note (Signed)
Takes Tikosyn since 05/23/13. Presently his heart rate is controlled.

## 2013-06-01 NOTE — Assessment & Plan Note (Signed)
Most of swelling is localized in the lateral right ankle area.

## 2013-06-04 DIAGNOSIS — R1312 Dysphagia, oropharyngeal phase: Secondary | ICD-10-CM | POA: Diagnosis not present

## 2013-06-04 DIAGNOSIS — R269 Unspecified abnormalities of gait and mobility: Secondary | ICD-10-CM | POA: Diagnosis not present

## 2013-06-04 DIAGNOSIS — M6281 Muscle weakness (generalized): Secondary | ICD-10-CM | POA: Diagnosis not present

## 2013-06-04 DIAGNOSIS — R29818 Other symptoms and signs involving the nervous system: Secondary | ICD-10-CM | POA: Diagnosis not present

## 2013-06-05 ENCOUNTER — Ambulatory Visit: Payer: Medicare Other | Admitting: Pharmacist

## 2013-06-06 ENCOUNTER — Non-Acute Institutional Stay: Payer: Medicare Other | Admitting: Nurse Practitioner

## 2013-06-06 ENCOUNTER — Encounter: Payer: Self-pay | Admitting: Internal Medicine

## 2013-06-06 ENCOUNTER — Encounter: Payer: Self-pay | Admitting: Nurse Practitioner

## 2013-06-06 DIAGNOSIS — R609 Edema, unspecified: Secondary | ICD-10-CM

## 2013-06-06 DIAGNOSIS — R269 Unspecified abnormalities of gait and mobility: Secondary | ICD-10-CM

## 2013-06-06 DIAGNOSIS — I5023 Acute on chronic systolic (congestive) heart failure: Secondary | ICD-10-CM | POA: Diagnosis not present

## 2013-06-06 DIAGNOSIS — R29818 Other symptoms and signs involving the nervous system: Secondary | ICD-10-CM | POA: Diagnosis not present

## 2013-06-06 DIAGNOSIS — E039 Hypothyroidism, unspecified: Secondary | ICD-10-CM

## 2013-06-06 DIAGNOSIS — F4323 Adjustment disorder with mixed anxiety and depressed mood: Secondary | ICD-10-CM

## 2013-06-06 DIAGNOSIS — I10 Essential (primary) hypertension: Secondary | ICD-10-CM

## 2013-06-06 DIAGNOSIS — L299 Pruritus, unspecified: Secondary | ICD-10-CM

## 2013-06-06 DIAGNOSIS — M6281 Muscle weakness (generalized): Secondary | ICD-10-CM | POA: Diagnosis not present

## 2013-06-06 DIAGNOSIS — Z7901 Long term (current) use of anticoagulants: Secondary | ICD-10-CM

## 2013-06-06 DIAGNOSIS — I509 Heart failure, unspecified: Secondary | ICD-10-CM

## 2013-06-06 DIAGNOSIS — J309 Allergic rhinitis, unspecified: Secondary | ICD-10-CM

## 2013-06-06 DIAGNOSIS — I4891 Unspecified atrial fibrillation: Secondary | ICD-10-CM

## 2013-06-06 NOTE — Assessment & Plan Note (Signed)
Most of swelling is localized in the lateral right ankle area-improved since Furosemide 20mg  bid.

## 2013-06-06 NOTE — Assessment & Plan Note (Signed)
Rate controlled, takes Metoprolol 75mg bid.    

## 2013-06-06 NOTE — Assessment & Plan Note (Signed)
Difficulty falling asleep--takes Ativan 0.5mg  qhs, occasional takes Ambien. Lorazepam 0.5mg   q6hr prn available to him.

## 2013-06-06 NOTE — Assessment & Plan Note (Signed)
x of CABG, pacemaker, A-fibable, better with  SOB and right ankle edema since Furosemide 20mg  bid. BNP 376.9 05/28/13. Bun/creat 19/1.11 05/14/13. F/u Cardiology. Observe the patient.

## 2013-06-06 NOTE — Assessment & Plan Note (Signed)
takes Levothyroxine 19mcg, TSH 3.597 01/08/13 and 3.153 05/16/13

## 2013-06-06 NOTE — Assessment & Plan Note (Signed)
Eliquis 5 mg bid

## 2013-06-06 NOTE — Progress Notes (Signed)
Patient ID: Jon [name], male   DOB: 1922/01/18, 80 y.o.   MRN: [id number]   Code Status: DNR  Allergies  Allergen Reactions  . Caduet [Amlodipine-Atorvastatin] Other (See Comments)    Weakness   . Crestor [Rosuvastatin] Other (See Comments)    Muscle pain/weakness  . Cymbalta [Duloxetine Hcl] Other (See Comments)    Excessive sedation  . Lipitor [Atorvastatin Calcium] Other (See Comments)    Muscle pain/weakness  . Lisinopril Other (See Comments)    Doesn't remember reaction  . Simvastatin Other (See Comments)    Muscle pain/weakness    Chief Complaint  Patient presents with  . Medical Managment of Chronic Issues  . Acute Visit    sleepiness    HPI: Patient is a 80 y.o. male seen in the [facility]  at [facility] today for evaluation of excessive sleepiness and other chronic medical conditions.   Hospitalized from 04/30/2013 to 05/03/2013 for RLE weakness which lasted about 30 minutes. TIA was considered given nature of the problem and CT brain findings/MRI and MRA brain. He was discharged with increased Eliquis and adjusting of antihypertensive medications.   Problem List Items Addressed This Visit   Abnormality of gait - Primary     Slight weakness of the right side since CVA with muscle strength 5/5. Ambulates with walker and w/c to go further.     Acute on chronic systolic congestive heart failure, NYHA class 2     x of CABG, pacemaker, A-fibable, better with  SOB and right ankle edema since Furosemide 20mg  bid. BNP 376.9 05/28/13. Bun/creat 19/1.11 05/14/13. F/u Cardiology. Observe the patient.       Adjustment disorder with mixed anxiety and depressed mood     Difficulty falling asleep--takes Ativan 0.5mg  qhs, occasional takes Ambien. Lorazepam 0.5mg   q6hr prn available to him.     Allergic rhinitis     Vs vasomotor rhinitis since clear nasal drainage worsens at meals: prn Atovent nasal spray I spray each nostril ac meals. The left TM-small fluid, the right  impacted cerumen. No pain during examination. Continue Claritin 10mg  daily.       Atrial fibrillation (Chronic)     Rate controlled, takes Metoprolol 75mg  bid.       Edema     Most of swelling is localized in the lateral right ankle area-improved since Furosemide 20mg  bid.      HTN (hypertension) (Chronic)     Metoprolol 75 mg twice a day and Losartan 100mg  daily and Furosemide 20mg  bid-mild elevated SBP in 150s  Average.       Hypothyroidism (Chronic)     takes Levothyroxine 69mcg, TSH 3.597 01/08/13 and 3.153 05/16/13      Long term (current) use of anticoagulants (Chronic)     Eliquis 5mg  bid.      Pruritic condition     Chronic itching and noted mild scratch injury Bil shins-takes Claritin 10mg  daily and Hydroxyzine 10mg  qid-c/o too sleepy-will change Hydroxyzine to 10mg  qid prn-observe the patient.        Review of Systems:  Review of Systems  Constitutional: Positive for malaise/fatigue. Negative for fever, chills, weight loss and diaphoresis.       C/o excessive sleepiness  HENT: Positive for hearing loss. Negative for congestion, ear discharge, ear pain, nosebleeds, sore throat and tinnitus.   Eyes: Negative for blurred vision, double vision, photophobia, pain, discharge and redness.       Dry eyes. Especially the right eye since its incomplete closure since  the CVA  Respiratory: Positive for shortness of breath. Negative for cough, hemoptysis, sputum production, wheezing and stridor.        C/o more SOB-desires O2 Sat check >905 RA so far-no further complaints  Cardiovascular: Positive for leg swelling and PND. Negative for chest pain, palpitations, orthopnea and claudication.       Most prominent the right lateral ankle-better since Furosemide 20mg  bid.   Gastrointestinal: Negative for heartburn, nausea, abdominal pain, diarrhea, constipation and blood in stool.  Genitourinary: Positive for urgency and frequency. Negative for dysuria, hematuria and flank pain.        Cannot make it to commode sometimes. Kegel exercise and adult depends.   Musculoskeletal: Positive for joint pain. Negative for back pain, falls, myalgias and neck pain.       Left knee pain.   Skin: Positive for itching. Negative for rash.       Chronic, takes Claritin daily, has Hydroxyzine 10mg  qid prn available to him. Mild shin scratch injuries seen.   Neurological: Positive for speech change and focal weakness. Negative for dizziness, tingling, tremors, sensory change, seizures, loss of consciousness, weakness and headaches.       Mild R facial droop and right sided weakness. Grip strength 5/5-but weaker than the left. Unsteady gait. Aphasia is better.   Endo/Heme/Allergies: Positive for environmental allergies. Negative for polydipsia. Does not bruise/bleed easily.  Psychiatric/Behavioral: Positive for depression and memory loss. Negative for suicidal ideas, hallucinations and substance abuse. The patient is nervous/anxious and has insomnia.      Past Medical History  Diagnosis Date  . Hypertension   . Coronary artery disease   . Arthritis   . Cataract   . Anxiety   . Blood transfusion without reported diagnosis   . Hyperlipidemia   . Heart murmur   . Allergy   . Thyroid disease   . PAF (paroxysmal atrial fibrillation)     coumadin  . History of abdominal aortic aneurysm   . History of echocardiogram 12/2008    EF >55%; mild concentric LVH; mild MR; mild-mod TR; mild AV regurg;   . History of nuclear stress test 12/2010    lexiscan; low risk; compared to prior study, perfusion improved  . History of Doppler ultrasound 05/22/2012    LEAs; R anterior tibial artery appeared occluded; L posterior tibial shows short segment of occlusive ds  . History of Doppler ultrasound 05/22/2012    Abdominal Aortic Doppler; slight increase in fusiform aneurysm   . Stroke    Past Surgical History  Procedure Laterality Date  . Coronary artery bypass graft  1989  . Pacemaker insertion   2011  . Insert / replace / remove pacemaker    . Eye surgery    . Joint replacement    . Hernia repair    . Coronary angioplasty with stent placement  2008    stent to SVG to OM   Social History:   reports that he quit smoking about 46 years ago. His smoking use included Cigarettes. He has a 11.25 pack-year smoking history. He has never used smokeless tobacco. He reports that he does not drink alcohol or use illicit drugs.  Family History  Problem Relation Age of Onset  . Diabetes Father   . Other Mother     PAD with amputations    Medications: Patient's Medications  New Prescriptions   No medications on file  Previous Medications   ACETAMINOPHEN (TYLENOL) 325 MG TABLET    Take 2 tablets (650  mg total) by mouth 2 (two) times daily. DO NOT EXCEED 4 TABLETS DAILY   ALLOPURINOL (ZYLOPRIM) 100 MG TABLET    Take 50 mg by mouth daily.    AMOXICILLIN (AMOXIL) 500 MG CAPSULE    Take 500 mg by mouth. Take 4 caps prior to dential   APIXABAN (ELIQUIS) 5 MG TABS TABLET    Take 1 tablet (5 mg total) by mouth 2 (two) times daily.   CHOLECALCIFEROL (VITAMIN D) 400 UNITS CAPSULE    Take 400 Units by mouth 2 (two) times daily.    FUROSEMIDE (LASIX) 20 MG TABLET    Take 1 tablet (20 mg total) by mouth 2 (two) times daily.   HYDROXYZINE (ATARAX/VISTARIL) 10 MG TABLET    Take 10 mg by mouth 4 (four) times daily as needed.    IPRATROPIUM (ATROVENT) 0.03 % NASAL SPRAY    Place 1 spray into both nostrils 3 (three) times daily as needed (post nasal drip).    LEVOTHYROXINE (SYNTHROID, LEVOTHROID) 100 MCG TABLET    Take 100 mcg by mouth daily before breakfast.   LORATADINE (CLARITIN) 10 MG TABLET    Take 10 mg by mouth daily.   LORAZEPAM (ATIVAN) 0.5 MG TABLET    0.5 mg at bedtime. And q6hr prn.   LOSARTAN (COZAAR) 100 MG TABLET    Take 1 tablet (100 mg total) by mouth daily.   METOPROLOL TARTRATE (LOPRESSOR) 25 MG TABLET    Take 3 tablets (75 mg total) by mouth 2 (two) times daily.   NITROGLYCERIN  (NITROSTAT) 0.4 MG SL TABLET    Place 0.4 mg under the tongue every 5 (five) minutes as needed for chest pain.    POLYVINYL ALCOHOL (LIQUIFILM TEARS) 1.4 % OPHTHALMIC SOLUTION    Place 1 drop into both eyes 4 (four) times daily as needed for dry eyes.   POTASSIUM CHLORIDE (MICRO-K) 10 MEQ CR CAPSULE    Take 10 mEq by mouth 2 (two) times daily.   SKIN PROTECTANTS, MISC. (EUCERIN) CREAM    Apply 1 application topically daily as needed for dry skin (itching).   ZOLPIDEM (AMBIEN) 10 MG TABLET    Take 2.5-5 mg by mouth at bedtime as needed for sleep.  Modified Medications   No medications on file  Discontinued Medications   No medications on file   Physical Exam: Physical Exam  Nursing note and vitals reviewed. Constitutional: He is oriented to person, place, and time. He appears well-developed and well-nourished. No distress.  W/c for generalized weakness and left knee pain. Ambulates with walker today.   HENT:  Severe hearing loss. Bilateral aides.Right facial droop. No pain complained today.   Eyes:  Corrective lenses. Increased exposure of the right cornea due to post CVA facial droop.  Neck: No JVD present. No tracheal deviation present. No thyromegaly present.  Cardiovascular: Normal rate and normal heart sounds.  Frequent extrasystoles are present.  No murmur heard. Pulmonary/Chest: Effort normal. He has no wheezes. He has rales.  Moist rales posterior left mid-lower lung and the right base.   Abdominal: Soft. Bowel sounds are normal. He exhibits no distension and no mass. There is no tenderness.  Musculoskeletal: Normal range of motion. He exhibits edema and tenderness.  Unstable gait. Using 4 wheel walker and w/c. Weaker on the right side. Left knee pain w/o swelling or redness or heat. Hx of the right total knee replacement. Edema noted at the lateral right ankle-better since Furosemide 20mg  bid.   Lymphadenopathy:    He  has no cervical adenopathy.  Neurological: He is alert and  oriented to person, place, and time. He displays normal reflexes. No cranial nerve deficit. He exhibits normal muscle tone. Coordination normal.  Slurred speech. Right side weakness.  Skin: Skin is warm and dry. No rash noted. No erythema. No pallor.  Mild scratch injury shins.   Psychiatric: He has a normal mood and affect. His behavior is normal. Judgment and thought content normal.   Filed Vitals:   06/06/13 1157  BP: 148/84  Pulse: 66  Temp: 97.9 F (36.6 C)  TempSrc: Tympanic  Resp: 20   Labs reviewed: Basic Metabolic Panel:  Recent Labs  02/22/13 0943  03/27/13 1636 04/30/13 1424 04/30/13 1425 05/14/13 05/16/13 05/28/13  NA 130*  < > 134* 141  --  139  --  140  K 4.0  < > 4.7 4.2  --  4.5  --  4.0  CL 97  --  99 104  --   --   --   --   CO2 23  --  25 24  --   --   --   --   GLUCOSE 131*  --  107* 111*  --   --   --   --   BUN 14  < > 25* 28*  --  19  --  25*  CREATININE 0.74  < > 1.18 1.11  --  1.1  --  1.1  CALCIUM 8.7  --  9.7 9.0  --   --   --   --   MG  --   --  2.3  --  2.2  --   --   --   TSH  --   --  6.812*  --   --  3.68 3.15  --   < > = values in this interval not displayed. Liver Function Tests:  Recent Labs  02/18/13 2025 03/27/13 1636 04/30/13 1424 05/14/13  AST 23 22 21 18   ALT 16 19 16 16   ALKPHOS 90 81 81 86  BILITOT 0.7 1.1 0.7  --   PROT 6.7 6.8 6.4  --   ALBUMIN 3.6 4.3 3.3*  --    CBC:  Recent Labs  11/08/12 1611 02/18/13 2025 02/18/13 2032 03/27/13 1636 04/30/13 1424  WBC 7.2 6.9  --  9.0 6.9  NEUTROABS 4.7 3.7  --   --  4.5  HGB 16.0 15.5 16.3 16.5 15.7  HCT 47.4 43.1 48.0 47.2 44.6  MCV 95.4 92.3  --  93.7 92.9  PLT 260.0 217  --  241 207   Lipid Panel:  Recent Labs  02/19/13 0355 03/26/13 05/01/13 0528  CHOL 187 89 117  HDL 34* 31* 30*  LDLCALC 117* 47 71  TRIG 181* 54 79  CHOLHDL 5.5  --  3.9   Past Procedures:   04/30/2013   CLINICAL DATA:  Stroke.  Ex-smoker.  EXAM: CHEST  2 VIEW  .  IMPRESSION: Minimal  bibasilar linear atelectasis.     04/30/2013   CLINICAL DATA:  TRANSIENT ISCHEMIC ATTACK  EXAM: CT HEAD WITHOUT CONTRAST  IMPRESSION: Findings consistent with an area of subacute to chronic lacunar infarction left MCA distribution. Involutional and chronic changes also appreciated. There is no evidence of acute abnormalities.     05/01/2013   CLINICAL DATA:  80 year old male with previous stroke, right lower extremity weakness. Awoke with increased weakness today. History of atrial fibrillation, with [employer] pacemaker (monitored by [employer] representative and  nursing staff for this exam with no complications observed). Initial encounter.  EXAM: MRI HEAD WITHOUT CONTRAST  MRA HEAD WITHOUT CONTRAST  TECHNIQUE: Multiplanar, multiecho pulse sequences of the brain and surrounding structures were obtained without intravenous contrast. Angiographic images of the head were obtained using MRA technique without contrast.  COMPARISON:  Head CTs 04/30/2013 and earlier.  FINDINGS: MRI HEAD FINDINGS  Round/oval 15 mm area of diffusion signal abnormality in the left posterior corona radiata extending toward the insula (series 4, image 16, series 3, image 21) does show mild decreased diffusion on ADC. Associated T2 and FLAIR hyperintensity without mass effect or hemorrhage.  Additionally there are 3 other punctate areas of restricted diffusion identified, 1 in the posterior left parietal lobe, 1 in the left occipital pole, and 1 in the contralateral right caudate nucleus (series 3 images 17 and).  Major intracranial vascular flow voids are preserved.  Patchy and confluent cerebral white matter T2 and FLAIR hyperintensity superimposed on the above findings. Chronic lacunar infarcts in the deep gray matter nuclei, most notably of the right thalamus. Chronic lacunar infarcts in the left cerebellar hemisphere. Mild T2 and FLAIR hyperintensity in the pons. Occasional chronic micro hemorrhages in the brain (right thalamus series 10,  image 13).  Visible internal auditory structures appear normal. Negative pituitary, cervicomedullary junction and visualized cervical spine. Normal bone marrow signal. No ventriculomegaly. No midline shift, mass effect, or evidence of intracranial mass lesion. Stable cerebral volume.  Postoperative changes to the globes. Visualized paranasal sinuses and mastoids are clear. Visualized scalp soft tissues are within normal limits.  MRA HEAD FINDINGS  Antegrade flow in the posterior circulation. Dominant distal left vertebral artery. Irregularity of the distal right vertebral artery suggesting tandem stenosis. Still, the right PICA origin remains patent. Left vertebral primarily supplies the basilar. Patent left PICA origin. Basilar artery irregularity but no definite stenosis. SCA and left PCA origins are normal. Fetal type right PCA origin. Normal left posterior communicating artery. Bilateral PCA branches within normal limits.  Antegrade flow in both carotid termini. Fusiform enlargement of the cavernous left ICA up to 9 mm diameter. There is also fusiform enlargement of the right ICA terminus, up to 6 mm diameter. Evidence of calcified ICA plaque bilaterally, but no definite ICA stenosis.  Patent carotid termini. Ophthalmic and posterior communicating artery origins are within normal limits. MCA and ACA origins are patent. Dominant left ACA A1 segment. Anterior communicating artery and visualized ACA branches are within normal limits. Left MCA branches are within normal limits.  Irregularity suggesting calcified plaque at the right ICA origin, but mild if any stenosis. Right MCA M1 segment remains patent. Visualized right MCA branches grossly normal.  IMPRESSION: 1. Small acute/subacute infarct left corona radiata. No mass effect or hemorrhage. 2. Three superimposed punctate acute lacunar infarcts (right caudate nucleus, left occipital pole, left left parietal lobe). These could reflect synchronous small vessel  ischemia rather than a recent embolic event. 3. Underlying chronic small vessel ischemia. 4. Intracranial atherosclerosis. Fusiform aneurysmal enlargement of the left ICA (cavernous segment) and right ICA terminus. No anterior circulation major branch occlusion. 5. Atherosclerosis in stenosis of the distal right vertebral artery which appears hemodynamically significant. Other posterior circulation atherosclerosis without stenosis.   Electronically Signed   By: Lars Pinks M.D.   On: 05/01/2013 13:59   Mr Jodene Nam Head/brain Wo Cm  05/01/2013   CLINICAL DATA:  80 year old male with previous stroke, right lower extremity weakness. Awoke with increased weakness today. History of atrial fibrillation,  with [employer] pacemaker (monitored by [employer] representative and nursing staff for this exam with no complications observed). Initial encounter.  EXAM: MRI HEAD WITHOUT CONTRAST  MRA HEAD WITHOUT CONTRAST   IMPRESSION: 1. Small acute/subacute infarct left corona radiata. No mass effect or hemorrhage. 2. Three superimposed punctate acute lacunar infarcts (right caudate nucleus, left occipital pole, left left parietal lobe). These could reflect synchronous small vessel ischemia rather than a recent embolic event. 3. Underlying chronic small vessel ischemia. 4. Intracranial atherosclerosis. Fusiform aneurysmal enlargement of the left ICA (cavernous segment) and right ICA terminus. No anterior circulation major branch occlusion. 5. Atherosclerosis in stenosis of the distal right vertebral artery which appears hemodynamically significant. Other posterior circulation atherosclerosis without stenosis.     EKG Atrial fibrillation Right bundle branch block ST depr, consider ischemia, anterolateral lds Baseline wander in lead(s) II III aVR aVL aVF V3   Assessment/Plan Abnormality of gait Slight weakness of the right side since CVA with muscle strength 5/5. Ambulates with walker and w/c to go further.   Acute on chronic  systolic congestive heart failure, NYHA class 2 x of CABG, pacemaker, A-fibable, better with  SOB and right ankle edema since Furosemide 20mg  bid. BNP 376.9 05/28/13. Bun/creat 19/1.11 05/14/13. F/u Cardiology. Observe the patient.     Adjustment disorder with mixed anxiety and depressed mood Difficulty falling asleep--takes Ativan 0.5mg  qhs, occasional takes Ambien. Lorazepam 0.5mg   q6hr prn available to him.   Allergic rhinitis Vs vasomotor rhinitis since clear nasal drainage worsens at meals: prn Atovent nasal spray I spray each nostril ac meals. The left TM-small fluid, the right impacted cerumen. No pain during examination. Continue Claritin 10mg  daily.     Atrial fibrillation Rate controlled, takes Metoprolol 75mg  bid.     Edema Most of swelling is localized in the lateral right ankle area-improved since Furosemide 20mg  bid.    Long term (current) use of anticoagulants Eliquis 5mg  bid.    Pruritic condition Chronic itching and noted mild scratch injury Bil shins-takes Claritin 10mg  daily and Hydroxyzine 10mg  qid-c/o too sleepy-will change Hydroxyzine to 10mg  qid prn-observe the patient.   Hypothyroidism takes Levothyroxine 67mcg, TSH 3.597 01/08/13 and 3.153 05/16/13    HTN (hypertension) Metoprolol 75 mg twice a day and Losartan 100mg  daily and Furosemide 20mg  bid-mild elevated SBP in 150s  Average.       Family/ Staff Communication: observe the patient.   Goals of Care: AL  Labs/tests ordered: none

## 2013-06-06 NOTE — Assessment & Plan Note (Signed)
Vs vasomotor rhinitis since clear nasal drainage worsens at meals: prn Atovent nasal spray I spray each nostril ac meals. The left TM-small fluid, the right impacted cerumen. No pain during examination. Continue Claritin 10mg  daily.

## 2013-06-06 NOTE — Assessment & Plan Note (Signed)
Chronic itching and noted mild scratch injury Bil shins-takes Claritin 10mg  daily and Hydroxyzine 10mg  qid-c/o too sleepy-will change Hydroxyzine to 10mg  qid prn-observe the patient.

## 2013-06-06 NOTE — Assessment & Plan Note (Signed)
Metoprolol 75 mg twice a day and Losartan 100mg daily and Furosemide 20mg bid-mild elevated SBP in 150s  Average.   

## 2013-06-06 NOTE — Assessment & Plan Note (Signed)
Slight weakness of the right side since CVA with muscle strength 5/5. Ambulates with walker and w/c to go further.

## 2013-06-11 DIAGNOSIS — R29818 Other symptoms and signs involving the nervous system: Secondary | ICD-10-CM | POA: Diagnosis not present

## 2013-06-11 DIAGNOSIS — M6281 Muscle weakness (generalized): Secondary | ICD-10-CM | POA: Diagnosis not present

## 2013-06-14 DIAGNOSIS — R29818 Other symptoms and signs involving the nervous system: Secondary | ICD-10-CM | POA: Diagnosis not present

## 2013-06-14 DIAGNOSIS — M6281 Muscle weakness (generalized): Secondary | ICD-10-CM | POA: Diagnosis not present

## 2013-06-17 ENCOUNTER — Encounter: Payer: Self-pay | Admitting: Internal Medicine

## 2013-06-17 NOTE — Progress Notes (Signed)
Patient ID: Jon [name], male   DOB: 1922/02/24, 80 y.o.   MRN: [id number]    Location:  [facility]   Place of Service: ALF (13)  PCP: [name], MD  Code Status: LIVING WILL  Extended Emergency Contact Information Primary Emergency Contact: Mckey,Betty C Address: [address]          [place], [place] [address] [place] Phone: [phone] Mobile Phone: [phone] Relation: Spouse Secondary Emergency Contact: Andreas Blower [place] Phone: [phone] Relation: Daughter  Allergies  Allergen Reactions  . Caduet [Amlodipine-Atorvastatin] Other (See Comments)    Weakness   . Crestor [Rosuvastatin] Other (See Comments)    Muscle pain/weakness  . Cymbalta [Duloxetine Hcl] Other (See Comments)    Excessive sedation  . Lipitor [Atorvastatin Calcium] Other (See Comments)    Muscle pain/weakness  . Lisinopril Other (See Comments)    Doesn't remember reaction  . Simvastatin Other (See Comments)    Muscle pain/weakness    Chief Complaint  Patient presents with  . New Evaluation    admit to [facility] AL following hospitalization    HPI:  Admit to AL/RCB at [facility] on 05/03/13 following hospitalization from 04/30/13 through 05/03/13 for acute ischemic stroke in the left MCA area. This is a recurrent stroke with his last CVA in dec 2014. He initially lost control of his right leg, but has recovered from this. He continues to have a reisual dysarthria.  Chronic AF is rate controlled and anticoagulated with Eliquis.  HTN is controlled  Dm is controlled    Past Medical History  Diagnosis Date  . Hypertension   . Coronary artery disease   . Arthritis   . Cataract   . Anxiety   . Blood transfusion without reported diagnosis   . Hyperlipidemia   . Heart murmur   . Allergy   . Thyroid disease   . PAF (paroxysmal atrial fibrillation)     coumadin  . History of abdominal aortic aneurysm   . History of  echocardiogram 12/2008    EF >55%; mild concentric LVH; mild MR; mild-mod TR; mild AV regurg;   . History of nuclear stress test 12/2010    lexiscan; low risk; compared to prior study, perfusion improved  . History of Doppler ultrasound 05/22/2012    LEAs; R anterior tibial artery appeared occluded; L posterior tibial shows short segment of occlusive ds  . History of Doppler ultrasound 05/22/2012    Abdominal Aortic Doppler; slight increase in fusiform aneurysm   . Stroke   . Adjustment disorder with mixed anxiety and depressed mood 04/18/2013    Post CVA depression and anxiety   . Abnormality of gait 04/18/2013  . 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 Increased exposure of the right cornea.   . Pruritic condition 01/24/2013  . Memory change 01/24/2013  . Abnormal PFTs 10/30/2012    Followed in Pulmonary clinic/ [facility]/ [facility]  - PFT's 10/22/12 VC  55% no obst, DLCO 50%  - PFT's 12/20/2012 VC 83% and no obst, dLCO 55%  -11/08/2012  Walked RA x 3 laps @ 185 ft each stopped due to  End of study, not desat -11/08/12 esr 10    . Impotence of organic origin 10/03/2000  . Osteoarthrosis, unspecified whether generalized or localized, unspecified site 02/05/2003  . Hypertrophy of prostate with urinary obstruction and other lower urinary tract symptoms (LUTS) 04/28/2004  . Gout, unspecified 11/03/2004  .  Acute on chronic systolic congestive heart failure, NYHA class 2 03/16/2006    BNP 376.9 05/28/13   . Atherosclerosis of renal artery 10/18/2006  . Unspecified vitamin D deficiency 10/18/2006  . Obstructive sleep apnea (adult) (pediatric) 07/23/2008  . Major depressive disorder, single episode, unspecified 03/05/2009  . Basal cell carcinoma of skin of other and unspecified parts of face 12/24/2009  . Internal hemorrhoids without mention of complication 81/03/7508  . Muscle weakness (generalized) 03/10/2011  . Pain in joint, lower leg 08/04/2011  . Nocturia 10/27/2011  . Aortic  aneurysm of unspecified site without mention of rupture 10/27/2011  . Fall 03/04/2011  . Hyponatremia 03/04/2011  . TIA (transient ischemic attack) 04/30/2013  . Fatigue 04/18/2013  . 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 CVA - Rt brain stroke 12/14 02/18/2013    02/19/13 angiography CT head:  Diffuse atherosclerotic irregularity and plaque formation of the distal right common carotid artery and proximal internal carotid artery without significant stenosis. Plaque ulceration is present and is a source of emboli. A small right thalamic CVA    . Dyslipidemia 11/03/2004  . Type II or unspecified type diabetes mellitus without mention of complication, not stated as uncontrolled 11/24/2004  . Spinal stenosis in cervical region 12/15/2004  . Right bundle branch block 04/19/2006  . Cardiac pacemaker in situ- MDT 10/11 03/18/2010    Medtronic revo implant in October 2011. Severe sinus bradycardia in the 30s, but not truly pacemaker dependent   . Long term (current) use of anticoagulants 03/17/2011  . Hypothyroidism 03/04/2011  . HTN (hypertension) 03/04/2011  . Atrial fibrillation 03/04/2011     He was on coumadin until his stroke last December and was switched to apixaban 2.$RemoveBefor'5mg'xKnfRhldJCEw$  daily which he has been taking faithfully without reported side effects.      Past Surgical History  Procedure Laterality Date  . Coronary artery bypass graft  1989  . Pacemaker insertion  2011  . Insert / replace / remove pacemaker    . Eye surgery    . Joint replacement    . Hernia repair    . Coronary angioplasty with stent placement  2008    stent to SVG to OM     PAST PROCEDURES 02/19/13 CT head:  No acute intracranial findings.  Stable chronic small vessel disease, old right thalamic lacune, and  old left cerebellar infarct.   04/30/13 MR brain: 1. Small acute/subacute infarct left corona radiata. No mass effect  or hemorrhage.  2. Three superimposed  punctate acute lacunar infarcts (right caudate  nucleus, left occipital pole, left left parietal lobe). These could  reflect synchronous small vessel ischemia rather than a recent  embolic event.  3. Underlying chronic small vessel ischemia.  4. Intracranial atherosclerosis. Fusiform aneurysmal enlargement of  the left ICA (cavernous segment) and right ICA terminus. No anterior  circulation major branch occlusion.  5. Atherosclerosis in stenosis of the distal right vertebral artery  which appears hemodynamically significant. Other posterior  circulation atherosclerosis without stenosis.   Social History: History   Social History  . Marital Status: Married    Spouse Name: [name]    Number of Children: 3  . Years of Education: [name]   Occupational History  . Retired Armed forces operational officer    Social History Main Topics  . Smoking status: Former Smoker -- 0.75 packs/day for 15 years    Types: Cigarettes    Quit date: 03/04/1967  . Smokeless  tobacco: Never Used  . Alcohol Use: No  . Drug Use: No  . Sexual Activity: No   Other Topics Concern  . Not on file   Social History Narrative  . No narrative on file    Family History Family Status  Relation Status Death Age  . Father Deceased 83  . Mother Deceased 88    PAD  . Sister Alive   . Brother Alive   . Daughter Alive   . Son Alive   . Sister Alive   . Brother Alive   . Brother Alive   . Brother Alive   . Daughter Alive    Family History  Problem Relation Age of Onset  . Diabetes Father   . Other Mother     PAD with amputations     Medications: Patient's Medications  New Prescriptions   No medications on file  Previous Medications   ACETAMINOPHEN (TYLENOL) 325 MG TABLET    Take 2 tablets (650 mg total) by mouth 2 (two) times daily. DO NOT EXCEED 4 TABLETS DAILY   ALLOPURINOL (ZYLOPRIM) 100 MG TABLET    Take 50 mg by mouth daily.    AMOXICILLIN (AMOXIL) 500 MG CAPSULE    Take 500 mg by mouth. Take 4 caps prior to  dential   APIXABAN (ELIQUIS) 5 MG TABS TABLET    Take 1 tablet (5 mg total) by mouth 2 (two) times daily.   CHOLECALCIFEROL (VITAMIN D) 400 UNITS CAPSULE    Take 400 Units by mouth 2 (two) times daily.    FUROSEMIDE (LASIX) 20 MG TABLET    Take 1 tablet (20 mg total) by mouth 2 (two) times daily.   HYDROXYZINE (ATARAX/VISTARIL) 10 MG TABLET    Take 10 mg by mouth 4 (four) times daily as needed.    IPRATROPIUM (ATROVENT) 0.03 % NASAL SPRAY    Place 1 spray into both nostrils 3 (three) times daily as needed (post nasal drip).    LEVOTHYROXINE (SYNTHROID, LEVOTHROID) 100 MCG TABLET    Take 100 mcg by mouth daily before breakfast.   LORATADINE (CLARITIN) 10 MG TABLET    Take 10 mg by mouth daily.   LORAZEPAM (ATIVAN) 0.5 MG TABLET    0.5 mg at bedtime. And q6hr prn.   LOSARTAN (COZAAR) 100 MG TABLET    Take 1 tablet (100 mg total) by mouth daily.   METOPROLOL TARTRATE (LOPRESSOR) 25 MG TABLET    Take 3 tablets (75 mg total) by mouth 2 (two) times daily.   NITROGLYCERIN (NITROSTAT) 0.4 MG SL TABLET    Place 0.4 mg under the tongue every 5 (five) minutes as needed for chest pain.    POLYVINYL ALCOHOL (LIQUIFILM TEARS) 1.4 % OPHTHALMIC SOLUTION    Place 1 drop into both eyes 4 (four) times daily as needed for dry eyes.   POTASSIUM CHLORIDE (MICRO-K) 10 MEQ CR CAPSULE    Take 10 mEq by mouth 2 (two) times daily.   SKIN PROTECTANTS, MISC. (EUCERIN) CREAM    Apply 1 application topically daily as needed for dry skin (itching).   ZOLPIDEM (AMBIEN) 10 MG TABLET    Take 2.5-5 mg by mouth at bedtime as needed for sleep.  Modified Medications   No medications on file  Discontinued Medications   No medications on file    Immunization History  Administered Date(s) Administered  . Influenza Whole 12/06/2011, 12/19/2012  . Pneumococcal Polysaccharide-23 03/07/1986  . Td 04/20/2005     Review of Systems  Constitutional:  Negative.   HENT: Positive for hearing loss. Negative for congestion, ear discharge,  ear pain, rhinorrhea, sinus pressure and tinnitus.   Eyes: Negative.   Respiratory: Negative for cough, choking, chest tightness, shortness of breath and wheezing.   Cardiovascular: Negative for palpitations and leg swelling (sometimes, not apparent today. ).  Gastrointestinal: Negative.  Negative for abdominal pain, diarrhea and abdominal distention.  Endocrine:       Hypothyroid  Genitourinary: Positive for frequency.  Musculoskeletal: Positive for arthralgias and gait problem. Negative for neck pain and neck stiffness.  Skin: Negative.  Negative for pallor and rash.  Allergic/Immunologic: Negative.   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 Recurrent CVA on 04/30/13 of the left MCA area  Hematological: Negative.   Psychiatric/Behavioral: Negative.       Filed Vitals:   05/10/13 0912  BP: 146/80  Pulse: 67  Temp: 97.6 F (36.4 C)  TempSrc: Oral  Resp: 19  Height: $Remove'5\' 8"'cIvhdHo$  (1.727 m)  Weight: 160 lb (72.576 kg)   Physical Exam  Constitutional: He appears well-developed and well-nourished. He appears distressed.  HENT:  Bilateral loss of hearing.  Eyes:  Corrective lenses  Neck: No JVD present. No tracheal deviation present. No thyromegaly present.  Cardiovascular:  AF.  Respiratory: No respiratory distress. He has no wheezes. He has no rales.  GI: He exhibits no distension and no mass. There is no tenderness.  Musculoskeletal: Normal range of motion. He exhibits edema. He exhibits no tenderness.  Lymphadenopathy:    He has no cervical adenopathy.  Neurological:  Right hemiparesis is mild. Right facial droop.Marland Kitchen Slurred speech.  Skin: No rash noted. No erythema. No pallor.  Psychiatric: His behavior is normal. Judgment and thought content normal.  Dysphoric move.       Labs reviewed: Admission on 04/30/2013, Discharged on 05/03/2013  Component Date Value Ref Range Status  . Prothrombin Time 04/30/2013  17.2* 11.6 - 15.2 seconds Final  . INR 04/30/2013 1.44  0.00 - 1.49 Final  . aPTT 04/30/2013 37  24 - 37 seconds Final   Comment:                                 IF BASELINE aPTT IS ELEVATED,                          SUGGEST PATIENT RISK ASSESSMENT                          BE USED TO DETERMINE APPROPRIATE                          ANTICOAGULANT THERAPY.  . WBC 04/30/2013 6.9  4.0 - 10.5 K/uL Final  . RBC 04/30/2013 4.80  4.22 - 5.81 MIL/uL Final  . Hemoglobin 04/30/2013 15.7  13.0 - 17.0 g/dL Final  . HCT 04/30/2013 44.6  39.0 - 52.0 % Final  . MCV 04/30/2013 92.9  78.0 - 100.0 fL Final  . MCH 04/30/2013 32.7  26.0 - 34.0 pg Final  . MCHC 04/30/2013 35.2  30.0 - 36.0 g/dL Final  . RDW 04/30/2013 13.4  11.5 - 15.5 % Final  . Platelets 04/30/2013 207  150 - 400 K/uL Final  . Neutrophils Relative % 04/30/2013 65  43 - 77 %  Final  . Neutro Abs 04/30/2013 4.5  1.7 - 7.7 K/uL Final  . Lymphocytes Relative 04/30/2013 20  12 - 46 % Final  . Lymphs Abs 04/30/2013 1.4  0.7 - 4.0 K/uL Final  . Monocytes Relative 04/30/2013 13* 3 - 12 % Final  . Monocytes Absolute 04/30/2013 0.9  0.1 - 1.0 K/uL Final  . Eosinophils Relative 04/30/2013 1  0 - 5 % Final  . Eosinophils Absolute 04/30/2013 0.1  0.0 - 0.7 K/uL Final  . Basophils Relative 04/30/2013 0  0 - 1 % Final  . Basophils Absolute 04/30/2013 0.0  0.0 - 0.1 K/uL Final  . Sodium 04/30/2013 141  137 - 147 mEq/L Final  . Potassium 04/30/2013 4.2  3.7 - 5.3 mEq/L Final  . Chloride 04/30/2013 104  96 - 112 mEq/L Final  . CO2 04/30/2013 24  19 - 32 mEq/L Final  . Glucose, Bld 04/30/2013 111* 70 - 99 mg/dL Final  . BUN 04/30/2013 28* 6 - 23 mg/dL Final  . Creatinine, Ser 04/30/2013 1.11  0.50 - 1.35 mg/dL Final  . Calcium 04/30/2013 9.0  8.4 - 10.5 mg/dL Final  . Total Protein 04/30/2013 6.4  6.0 - 8.3 g/dL Final  . Albumin 04/30/2013 3.3* 3.5 - 5.2 g/dL Final  . AST 04/30/2013 21  0 - 37 U/L Final  . ALT 04/30/2013 16  0 - 53 U/L Final  .  Alkaline Phosphatase 04/30/2013 81  39 - 117 U/L Final  . Total Bilirubin 04/30/2013 0.7  0.3 - 1.2 mg/dL Final  . GFR calc non Af Amer 04/30/2013 57* >90 mL/min Final  . GFR calc Af Amer 04/30/2013 66* >90 mL/min Final   Comment: (NOTE)                          The eGFR has been calculated using the CKD EPI equation.                          This calculation has not been validated in all clinical situations.                          eGFR's persistently <90 mL/min signify possible Chronic Kidney                          Disease.  . Color, Urine 04/30/2013 YELLOW  YELLOW Final  . APPearance 04/30/2013 CLEAR  CLEAR Final  . Specific Gravity, Urine 04/30/2013 1.013  1.005 - 1.030 Final  . pH 04/30/2013 6.5  5.0 - 8.0 Final  . Glucose, UA 04/30/2013 NEGATIVE  NEGATIVE mg/dL Final  . Hgb urine dipstick 04/30/2013 NEGATIVE  NEGATIVE Final  . Bilirubin Urine 04/30/2013 NEGATIVE  NEGATIVE Final  . Ketones, ur 04/30/2013 NEGATIVE  NEGATIVE mg/dL Final  . Protein, ur 04/30/2013 NEGATIVE  NEGATIVE mg/dL Final  . Urobilinogen, UA 04/30/2013 0.2  0.0 - 1.0 mg/dL Final  . Nitrite 04/30/2013 NEGATIVE  NEGATIVE Final  . Leukocytes, UA 04/30/2013 NEGATIVE  NEGATIVE Final   MICROSCOPIC NOT DONE ON URINES WITH NEGATIVE PROTEIN, BLOOD, LEUKOCYTES, NITRITE, OR GLUCOSE <1000 mg/dL.  . Troponin I 04/30/2013 <0.30  <0.30 ng/mL Final   Comment:  Due to the release kinetics of cTnI,                          a negative result within the first hours                          of the onset of symptoms does not rule out                          myocardial infarction with certainty.                          If myocardial infarction is still suspected,                          repeat the test at appropriate intervals.  . Alcohol, Ethyl (B) 04/30/2013 <11  0 - 11 mg/dL Final   Comment:                                 LOWEST DETECTABLE LIMIT FOR                          SERUM ALCOHOL IS  11 mg/dL                          FOR MEDICAL PURPOSES ONLY  . Opiates 04/30/2013 NONE DETECTED  NONE DETECTED Final  . Cocaine 04/30/2013 NONE DETECTED  NONE DETECTED Final  . Benzodiazepines 04/30/2013 NONE DETECTED  NONE DETECTED Final  . Amphetamines 04/30/2013 NONE DETECTED  NONE DETECTED Final  . Tetrahydrocannabinol 04/30/2013 NONE DETECTED  NONE DETECTED Final  . Barbiturates 04/30/2013 NONE DETECTED  NONE DETECTED Final   Comment:                                 DRUG SCREEN FOR MEDICAL PURPOSES                          ONLY.  IF CONFIRMATION IS NEEDED                          FOR ANY PURPOSE, NOTIFY LAB                          WITHIN 5 DAYS.                                                          LOWEST DETECTABLE LIMITS                          FOR URINE DRUG SCREEN                          Drug Class       Cutoff (ng/mL)  Amphetamine      1000                          Barbiturate      200                          Benzodiazepine   200                          Tricyclics       712                          Opiates          300                          Cocaine          300                          THC              50  . Magnesium 04/30/2013 2.2  1.5 - 2.5 mg/dL Final  . Hemoglobin A1C 05/01/2013 6.3* <5.7 % Final   Comment: (NOTE)                                                                                                                         According to the ADA Clinical Practice Recommendations for 2011, when                          HbA1c is used as a screening test:                           >=6.5%   Diagnostic of Diabetes Mellitus                                    (if abnormal result is confirmed)                          5.7-6.4%   Increased risk of developing Diabetes Mellitus                          References:Diagnosis and Classification of Diabetes Mellitus,Diabetes                          [id number],9983,38(SNKNL 1):S62-S69 and  Standards of Medical Care in  Diabetes - 2011,Diabetes Care,2011,34 (Suppl 1):S11-S61.  . Mean Plasma Glucose 05/01/2013 134* <117 mg/dL Final   Performed at [facility]  . Cholesterol 05/01/2013 117  0 - 200 mg/dL Final  . Triglycerides 05/01/2013 79  <150 mg/dL Final  . HDL 05/01/2013 30* >39 mg/dL Final  . Total CHOL/HDL Ratio 05/01/2013 3.9   Final  . VLDL 05/01/2013 16  0 - 40 mg/dL Final  . LDL Cholesterol 05/01/2013 71  0 - 99 mg/dL Final   Comment:                                 Total Cholesterol/HDL:CHD Risk                          Coronary Heart Disease Risk Table                                              Men   Women                           1/2 Average Risk   3.4   3.3                           Average Risk       5.0   4.4                           2 X Average Risk   9.6   7.1                           3 X Average Risk  23.4   11.0                                                          Use the calculated Patient Ratio                          above and the CHD Risk Table                          to determine the patient's CHD Risk.                                                          ATP III CLASSIFICATION (LDL):                           <100     mg/dL   Optimal                           100-129  mg/dL   Near or Above  Optimal                           130-159  mg/dL   Borderline                           160-189  mg/dL   High                           >190     mg/dL   Very High  Office Visit on 04/26/2013  Component Date Value Ref Range Status  . Date Time Interrogation Session 04/26/2013 [id number]   Final  . Pulse Generator Manufacturer 04/26/2013 Medtronic   Final  . Pulse Gen Model 04/26/2013 RVDR01 Revo MRI   Final  . Pulse Gen Serial Number 04/26/2013 [id number]   Final  . RV Sense Sensitivity 04/26/2013 0.9   Final  . RA Pace Amplitude 04/26/2013 2   Final  .  RV Pace PulseWidth 04/26/2013 0.4   Final  . RV Pace Amplitude 04/26/2013 2.5   Final  . Zone Setting Type Category 04/26/2013 VF   Final  . Zone Setting Type Category 04/26/2013 VT   Final  . Zone Setting Type Category 04/26/2013 VENTRICULAR_TACHYCARDIA_1   Final  . Zone Setting Type Category 04/26/2013 VENTRICULAR_TACHYCARDIA_2   Final  . Zone Detect Interval 04/26/2013 400   Final  . Zone Setting Type Category 04/26/2013 ATRIAL_FIBRILLATION   Final  . Zone Setting Type Category 04/26/2013 ATAF   Final  . Zone Detect Interval 04/26/2013 350   Final  . RA Impedance 04/26/2013 480   Final  . RA Amplitude 04/26/2013 0.572   Final  . RV IMPEDANCE 04/26/2013 432   Final  . RV Amplitude 04/26/2013 11.511   Final  . RV Pacing Amplitude 04/26/2013 1.0   Final  . RV Pacing PulseWidth 04/26/2013 0.4   Final  . Battery Status 04/26/2013 OK   Final  . Battery Voltage 04/26/2013 2.99   Final  . Loletha Grayer RA Perc Paced 04/26/2013 42.58   Final  . Loletha Grayer RV Perc Paced 04/26/2013 74.30   Final  . Loletha Grayer AP VP Percent 04/26/2013 42.35   Final  . Loletha Grayer AS VP Percent 04/26/2013 31.94   Final  . Loletha Grayer AP VS Percent 04/26/2013 0.22   Final  . Brady AS VS Percent 04/26/2013 25.48   Final  . Eval Rhythm 04/26/2013 AF/VS   Final  . Miscellaneous Comment 04/26/2013    Final                   Value:Pacemaker check in clinic. Normal device function. Thresholds, sensing, impedances consistent with previous measurements. Device programmed to maximize longevity. 5 mode switches (56.2%)---Max dur. 30 days + Eliquis. No high ventricular rates noted.                          Device programmed at appropriate safety margins. Histogram distribution appropriate for patient activity level. Device programmed to optimize intrinsic conduction. Batt voltage 2.99V (ERI 2.81V). Patient will follow up via [facility] on 07-26-2013 and with                          [place] in 12 months.  Nursing Home on 04/11/2013  Component Date Value Ref  Range Status  . Glucose 03/26/2013 105  Final  . BUN 03/26/2013 23* 4 - 21 mg/dL Final  . Creatinine 03/26/2013 1.3  0.6 - 1.3 mg/dL Final  . Potassium 03/26/2013 4.5  3.4 - 5.3 mmol/L Final  . Sodium 03/26/2013 137  137 - 147 mmol/L Final  . Triglycerides 03/26/2013 54  40 - 160 mg/dL Final  . Cholesterol 03/26/2013 89  0 - 200 mg/dL Final  . HDL 03/26/2013 31* 35 - 70 mg/dL Final  . LDL Cholesterol 03/26/2013 47   Final  Office Visit on 03/27/2013  Component Date Value Ref Range Status  . TSH 03/27/2013 6.812* 0.350 - 4.500 uIU/mL Final  . WBC 03/27/2013 9.0  4.0 - 10.5 K/uL Final  . RBC 03/27/2013 5.04  4.22 - 5.81 MIL/uL Final  . Hemoglobin 03/27/2013 16.5  13.0 - 17.0 g/dL Final  . HCT 03/27/2013 47.2  39.0 - 52.0 % Final  . MCV 03/27/2013 93.7  78.0 - 100.0 fL Final  . MCH 03/27/2013 32.7  26.0 - 34.0 pg Final  . MCHC 03/27/2013 35.0  30.0 - 36.0 g/dL Final  . RDW 03/27/2013 13.8  11.5 - 15.5 % Final  . Platelets 03/27/2013 241  150 - 400 K/uL Final  . Sodium 03/27/2013 134* 135 - 145 mEq/L Final  . Potassium 03/27/2013 4.7  3.5 - 5.3 mEq/L Final  . Chloride 03/27/2013 99  96 - 112 mEq/L Final  . CO2 03/27/2013 25  19 - 32 mEq/L Final  . Glucose, Bld 03/27/2013 107* 70 - 99 mg/dL Final  . BUN 03/27/2013 25* 6 - 23 mg/dL Final  . Creat 03/27/2013 1.18  0.50 - 1.35 mg/dL Final  . Total Bilirubin 03/27/2013 1.1  0.3 - 1.2 mg/dL Final  . Alkaline Phosphatase 03/27/2013 81  39 - 117 U/L Final  . AST 03/27/2013 22  0 - 37 U/L Final  . ALT 03/27/2013 19  0 - 53 U/L Final  . Total Protein 03/27/2013 6.8  6.0 - 8.3 g/dL Final  . Albumin 03/27/2013 4.3  3.5 - 5.2 g/dL Final  . Calcium 03/27/2013 9.7  8.4 - 10.5 mg/dL Final  . Magnesium 03/27/2013 2.3  1.5 - 2.5 mg/dL Final  Admission on 02/18/2013, Discharged on 02/22/2013  Component Date Value Ref Range Status  . Prothrombin Time 02/18/2013 22.2* 11.6 - 15.2 seconds Final  . INR 02/18/2013 2.02* 0.00 - 1.49 Final  . aPTT  02/18/2013 42* 24 - 37 seconds Final   Comment:                                 IF BASELINE aPTT IS ELEVATED,                          SUGGEST PATIENT RISK ASSESSMENT                          BE USED TO DETERMINE APPROPRIATE                          ANTICOAGULANT THERAPY.  . WBC 02/18/2013 6.9  4.0 - 10.5 K/uL Final  . RBC 02/18/2013 4.67  4.22 - 5.81 MIL/uL Final  . Hemoglobin 02/18/2013 15.5  13.0 - 17.0 g/dL Final  . HCT 02/18/2013 43.1  39.0 - 52.0 % Final  . MCV 02/18/2013 92.3  78.0 - 100.0 fL Final  .  [place] 02/18/2013 33.2  26.0 - 34.0 pg Final  . MCHC 02/18/2013 36.0  30.0 - 36.0 g/dL Final  . RDW 02/18/2013 13.1  11.5 - 15.5 % Final  . Platelets 02/18/2013 217  150 - 400 K/uL Final  . Neutrophils Relative % 02/18/2013 54  43 - 77 % Final  . Neutro Abs 02/18/2013 3.7  1.7 - 7.7 K/uL Final  . Lymphocytes Relative 02/18/2013 31  12 - 46 % Final  . Lymphs Abs 02/18/2013 2.1  0.7 - 4.0 K/uL Final  . Monocytes Relative 02/18/2013 13* 3 - 12 % Final  . Monocytes Absolute 02/18/2013 0.9  0.1 - 1.0 K/uL Final  . Eosinophils Relative 02/18/2013 3  0 - 5 % Final  . Eosinophils Absolute 02/18/2013 0.2  0.0 - 0.7 K/uL Final  . Basophils Relative 02/18/2013 0  0 - 1 % Final  . Basophils Absolute 02/18/2013 0.0  0.0 - 0.1 K/uL Final  . Sodium 02/18/2013 128* 135 - 145 mEq/L Final  . Potassium 02/18/2013 3.5  3.5 - 5.1 mEq/L Final  . Chloride 02/18/2013 91* 96 - 112 mEq/L Final  . CO2 02/18/2013 26  19 - 32 mEq/L Final  . Glucose, Bld 02/18/2013 121* 70 - 99 mg/dL Final  . BUN 02/18/2013 19  6 - 23 mg/dL Final  . Creatinine, Ser 02/18/2013 1.12  0.50 - 1.35 mg/dL Final  . Calcium 02/18/2013 9.0  8.4 - 10.5 mg/dL Final  . Total Protein 02/18/2013 6.7  6.0 - 8.3 g/dL Final  . Albumin 02/18/2013 3.6  3.5 - 5.2 g/dL Final  . AST 02/18/2013 23  0 - 37 U/L Final  . ALT 02/18/2013 16  0 - 53 U/L Final  . Alkaline Phosphatase 02/18/2013 90  39 - 117 U/L Final  . Total Bilirubin 02/18/2013 0.7   0.3 - 1.2 mg/dL Final  . GFR calc non Af Amer 02/18/2013 56* >90 mL/min Final  . GFR calc Af Amer 02/18/2013 65* >90 mL/min Final   Comment: (NOTE)                          The eGFR has been calculated using the CKD EPI equation.                          This calculation has not been validated in all clinical situations.                          eGFR's persistently <90 mL/min signify possible Chronic Kidney                          Disease.  . Troponin I 02/18/2013 <0.30  <0.30 ng/mL Final   Comment:                                 Due to the release kinetics of cTnI,                          a negative result within the first hours                          of the onset of symptoms does not rule out  myocardial infarction with certainty.                          If myocardial infarction is still suspected,                          repeat the test at appropriate intervals.  . Opiates 02/18/2013 NONE DETECTED  NONE DETECTED Final  . Cocaine 02/18/2013 NONE DETECTED  NONE DETECTED Final  . Benzodiazepines 02/18/2013 NONE DETECTED  NONE DETECTED Final  . Amphetamines 02/18/2013 NONE DETECTED  NONE DETECTED Final  . Tetrahydrocannabinol 02/18/2013 NONE DETECTED  NONE DETECTED Final  . Barbiturates 02/18/2013 NONE DETECTED  NONE DETECTED Final   Comment:                                 DRUG SCREEN FOR MEDICAL PURPOSES                          ONLY.  IF CONFIRMATION IS NEEDED                          FOR ANY PURPOSE, NOTIFY LAB                          WITHIN 5 DAYS.                                                          LOWEST DETECTABLE LIMITS                          FOR URINE DRUG SCREEN                          Drug Class       Cutoff (ng/mL)                          Amphetamine      1000                          Barbiturate      200                          Benzodiazepine   200                          Tricyclics       357                          Opiates           300                          Cocaine          300  THC              50  . Color, Urine 02/18/2013 YELLOW  YELLOW Final  . APPearance 02/18/2013 CLEAR  CLEAR Final  . Specific Gravity, Urine 02/18/2013 1.010  1.005 - 1.030 Final  . pH 02/18/2013 6.5  5.0 - 8.0 Final  . Glucose, UA 02/18/2013 NEGATIVE  NEGATIVE mg/dL Final  . Hgb urine dipstick 02/18/2013 TRACE* NEGATIVE Final  . Bilirubin Urine 02/18/2013 NEGATIVE  NEGATIVE Final  . Ketones, ur 02/18/2013 NEGATIVE  NEGATIVE mg/dL Final  . Protein, ur 02/18/2013 NEGATIVE  NEGATIVE mg/dL Final  . Urobilinogen, UA 02/18/2013 0.2  0.0 - 1.0 mg/dL Final  . Nitrite 02/18/2013 NEGATIVE  NEGATIVE Final  . Leukocytes, UA 02/18/2013 NEGATIVE  NEGATIVE Final  . Sodium 02/18/2013 129* 135 - 145 mEq/L Final  . Potassium 02/18/2013 3.4* 3.5 - 5.1 mEq/L Final  . Chloride 02/18/2013 92* 96 - 112 mEq/L Final  . BUN 02/18/2013 19  6 - 23 mg/dL Final  . Creatinine, Ser 02/18/2013 1.20  0.50 - 1.35 mg/dL Final  . Glucose, Bld 02/18/2013 127* 70 - 99 mg/dL Final  . Calcium, Ion 02/18/2013 1.14  1.13 - 1.30 mmol/L Final  . TCO2 02/18/2013 26  0 - 100 mmol/L Final  . Hemoglobin 02/18/2013 16.3  13.0 - 17.0 g/dL Final  . HCT 02/18/2013 48.0  39.0 - 52.0 % Final  . Troponin i, poc 02/18/2013 0.01  0.00 - 0.08 ng/mL Final  . Comment 3 02/18/2013          Final   Comment: Due to the release kinetics of cTnI,                          a negative result within the first hours                          of the onset of symptoms does not rule out                          myocardial infarction with certainty.                          If myocardial infarction is still suspected,                          repeat the test at appropriate intervals.  . Glucose-Capillary 02/18/2013 110* 70 - 99 mg/dL Final  . WBC, UA 02/18/2013 0-2  <3 WBC/hpf Final  . RBC / HPF 02/18/2013 0-2  <3 RBC/hpf Final  . Hemoglobin A1C 02/19/2013 6.0* <5.7 % Final     Comment: (NOTE)  According to the ADA Clinical Practice Recommendations for 2011, when                          HbA1c is used as a screening test:                           >=6.5%   Diagnostic of Diabetes Mellitus                                    (if abnormal result is confirmed)                          5.7-6.4%   Increased risk of developing Diabetes Mellitus                          References:Diagnosis and Classification of Diabetes Mellitus,Diabetes                          [id number],5038,88(KCMKL 1):S62-S69 and Standards of Medical Care in                                  Diabetes - 2011,Diabetes [id number],9150,56 (Suppl 1):S11-S61.  . Mean Plasma Glucose 02/19/2013 126* <117 mg/dL Final   Performed at [facility]  . Cholesterol 02/19/2013 187  0 - 200 mg/dL Final  . Triglycerides 02/19/2013 181* <150 mg/dL Final  . HDL 02/19/2013 34* >39 mg/dL Final  . Total CHOL/HDL Ratio 02/19/2013 5.5   Final  . VLDL 02/19/2013 36  0 - 40 mg/dL Final  . LDL Cholesterol 02/19/2013 117* 0 - 99 mg/dL Final   Comment:                                 Total Cholesterol/HDL:CHD Risk                          Coronary Heart Disease Risk Table                                              Men   Women                           1/2 Average Risk   3.4   3.3                           Average Risk       5.0   4.4                           2 X Average Risk   9.6   7.1                           3 X Average Risk  23.4   11.0  Use the calculated Patient Ratio                          above and the CHD Risk Table                          to determine the patient's CHD Risk.                                                          ATP III CLASSIFICATION (LDL):                           <100     mg/dL   Optimal                            100-129  mg/dL   Near or Above                                             Optimal                           130-159  mg/dL   Borderline                           160-189  mg/dL   High                           >190     mg/dL   Very High  . Sodium, Ur 02/19/2013 78   Final  . Creatinine, Urine 02/19/2013 19.81   Final  . Osmolality, Ur 02/19/2013 249* 390 - 1090 mOsm/kg Final   Performed at [facility]  . Prothrombin Time 02/19/2013 21.9* 11.6 - 15.2 seconds Final  . INR 02/19/2013 1.98* 0.00 - 1.49 Final  . Sodium 02/22/2013 130* 135 - 145 mEq/L Final  . Potassium 02/22/2013 4.0  3.5 - 5.1 mEq/L Final  . Chloride 02/22/2013 97  96 - 112 mEq/L Final  . CO2 02/22/2013 23  19 - 32 mEq/L Final  . Glucose, Bld 02/22/2013 131* 70 - 99 mg/dL Final  . BUN 02/22/2013 14  6 - 23 mg/dL Final  . Creatinine, Ser 02/22/2013 0.74  0.50 - 1.35 mg/dL Final  . Calcium 02/22/2013 8.7  8.4 - 10.5 mg/dL Final  . GFR calc non Af Amer 02/22/2013 79* >90 mL/min Final  . GFR calc Af Amer 02/22/2013 >90  >90 mL/min Final   Comment: (NOTE)                          The eGFR has been calculated using the CKD EPI equation.                          This calculation has not been validated in all clinical situations.  eGFR's persistently <90 mL/min signify possible Chronic Kidney                          Disease.  Anti-coag visit on 02/15/2013  Component Date Value Ref Range Status  . INR 02/15/2013 1.9   Final     Assessment/Plan 1. Acute ischemic left MCA stroke Engaged in PT and OT. I think this is likely to be LTC placement.  2. HTN (hypertension) controlled  3. Acute on chronic systolic congestive heart failure, NYHA class 2 compensated  4. Atrial fibrillation Rate controlled and anticoagulated with Eliquis  5. Type II or unspecified type diabetes mellitus without mention of complication, not stated as uncontrolled controlled  6. 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

## 2013-06-18 DIAGNOSIS — R29818 Other symptoms and signs involving the nervous system: Secondary | ICD-10-CM | POA: Diagnosis not present

## 2013-06-18 DIAGNOSIS — M6281 Muscle weakness (generalized): Secondary | ICD-10-CM | POA: Diagnosis not present

## 2013-06-20 DIAGNOSIS — R29818 Other symptoms and signs involving the nervous system: Secondary | ICD-10-CM | POA: Diagnosis not present

## 2013-06-20 DIAGNOSIS — M6281 Muscle weakness (generalized): Secondary | ICD-10-CM | POA: Diagnosis not present

## 2013-06-25 DIAGNOSIS — M6281 Muscle weakness (generalized): Secondary | ICD-10-CM | POA: Diagnosis not present

## 2013-06-25 DIAGNOSIS — R29818 Other symptoms and signs involving the nervous system: Secondary | ICD-10-CM | POA: Diagnosis not present

## 2013-06-27 DIAGNOSIS — M6281 Muscle weakness (generalized): Secondary | ICD-10-CM | POA: Diagnosis not present

## 2013-06-27 DIAGNOSIS — R29818 Other symptoms and signs involving the nervous system: Secondary | ICD-10-CM | POA: Diagnosis not present

## 2013-07-02 DIAGNOSIS — R29818 Other symptoms and signs involving the nervous system: Secondary | ICD-10-CM | POA: Diagnosis not present

## 2013-07-02 DIAGNOSIS — M6281 Muscle weakness (generalized): Secondary | ICD-10-CM | POA: Diagnosis not present

## 2013-07-04 ENCOUNTER — Encounter: Payer: Self-pay | Admitting: Internal Medicine

## 2013-07-04 ENCOUNTER — Ambulatory Visit (INDEPENDENT_AMBULATORY_CARE_PROVIDER_SITE_OTHER): Payer: Medicare Other | Admitting: Internal Medicine

## 2013-07-04 VITALS — BP 124/68 | HR 66 | Ht 68.0 in | Wt 159.4 lb

## 2013-07-04 DIAGNOSIS — I6350 Cerebral infarction due to unspecified occlusion or stenosis of unspecified cerebral artery: Secondary | ICD-10-CM

## 2013-07-04 DIAGNOSIS — I63512 Cerebral infarction due to unspecified occlusion or stenosis of left middle cerebral artery: Secondary | ICD-10-CM

## 2013-07-04 DIAGNOSIS — I4891 Unspecified atrial fibrillation: Secondary | ICD-10-CM | POA: Diagnosis not present

## 2013-07-04 DIAGNOSIS — I5023 Acute on chronic systolic (congestive) heart failure: Secondary | ICD-10-CM | POA: Diagnosis not present

## 2013-07-04 DIAGNOSIS — I2510 Atherosclerotic heart disease of native coronary artery without angina pectoris: Secondary | ICD-10-CM | POA: Diagnosis not present

## 2013-07-04 DIAGNOSIS — I509 Heart failure, unspecified: Secondary | ICD-10-CM

## 2013-07-04 DIAGNOSIS — I4510 Unspecified right bundle-branch block: Secondary | ICD-10-CM | POA: Diagnosis not present

## 2013-07-04 NOTE — Patient Instructions (Addendum)
Your physician wants you to follow-up in: 6 months. You will receive a reminder letter in the mail two months in advance. If you don't receive a letter, please call our office to schedule the follow-up appointment.  Monitor your weight daily. Please contact us if your gain 3lbs in 1-2 days.

## 2013-07-04 NOTE — Progress Notes (Signed)
OFFICE NOTE  Chief Complaint:  Routine followup  Primary Care Physician: [name], MD  HPI:  Jon [name]  is an 80 yo male formerly followed by Dr. Rex Kras and recently seen by Cecilie Kicks, NP, for left lower extremity edema with discoloration of his toes, was beginning to be uncomfortable for him to walk. We did venous Dopplers. He was quite concerned about arterial blood supply. His mother ended up with bilateral amputations. Venous Dopplers showed a ruptured baker cyst, no DVT. He was instructed on this and since that time his symptoms have resolved completely. He is on Coumadin for paroxysmal A-fib which led to the discoloration in his toes. Additionally, he has a history of abdominal aortic aneurysm and because he was concerned about his arterial disease, if he had it, we did Dopplers.  He is here for the results of the Dopplers. Bilateral ABIs were 1.0, though he does have appearance of an occluded right anterior tibial artery and a left posterior tib demonstrated a short segment of occlusive disease with reconstitution at the ankle. His pulses have been 2+. He has no claudication symptoms. Additionally, the duplex of his abdominal aorta was slightly, minimally changed from his last one. Previously it was 3.95 x 3.57, now he is 3.7 x 4.0, essentially the same, very slight increase. He has no abdominal pain and no other complaints. He has really no complaints today, feels quite well. He is not aware of any tachycardias or palpitations. He has a Medtronic pacemaker which was interrogated today. This demonstrates a battery voltage of 3.0V.  His a-fib burden is 1.6% and he is on amiodarone.  Other history includes bypass grafting in 1989; vein graft was stented in 2008. Last stress test was 2012, low risk study. EF was 49%. Pacemaker was placed for paroxysmal A-fib and bradycardia, sick sinus syndrome in 2011. He is also on warfarin.   Jon [name] underwent PFTs on 10/22/2012. This  showed a moderately severe restrictive limitation as well as moderately reduced diffusion capacity. Based on these findings I recommended discontinuing his amiodarone and he also stopped his pravastatin. Since that time he's noted that he feels quite a bit better including some minor improvement in his knee pain. He still feels like his left knee is bothering him and he is status post right TKR. 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 I did refer him to the [facility], but has not been contacted for appointment so far.  Since his last followup, Jon [name] was seen by Dr. Sallyanne [name] for a device check in his device appeared to be working properly. He was having atrial fibrillation, but a little burden. He continued on warfarin which was generally therapeutic. Unfortunately, in December he had a thalamic stroke which resulted in some right facial droop and hemiparesis. That is improving , but slowly. He was then changed to Eliquis for anticoagulation. He was also started on Zetia, as he has a history of statin intolerance in the past.  An echo performed in the hospital demonstrated an EF of 40-45%, which is mildly reduced from previous studies. In addition, he has had a small amount of weight gain and lower extremity swelling which is noted over the past several weeks while in recovery.  Unfortunately, he suffered another stroke last month around the time that we plan to admit him for tikosyn induction. He is still trying to recover from that.   At his last office  visit, he had some increasing shortness of breath and worsening lower extremity swelling. His EKG shows persistent atrial fibrillation with intermittent pacing.  I checked laboratory work which indicated an elevated BNP of 381. He was also felt by exam to be in heart failure. I recommended increasing his Lasix to 20 mg twice daily. This is resulted in good diuresis and his weight now is down 5 pounds since his last office  visit. He reports being less short of breath with exertion. He complains of only minimal leg edema.  PMHx:  Past Medical History  Diagnosis Date  . Hypertension   . Coronary artery disease   . Arthritis   . Cataract   . Anxiety   . Blood transfusion without reported diagnosis   . Hyperlipidemia   . Heart murmur   . Allergy   . Thyroid disease   . PAF (paroxysmal atrial fibrillation)     coumadin  . History of abdominal aortic aneurysm   . History of echocardiogram 12/2008    EF >55%; mild concentric LVH; mild MR; mild-mod TR; mild AV regurg;   . History of nuclear stress test 12/2010    lexiscan; low risk; compared to prior study, perfusion improved  . History of Doppler ultrasound 05/22/2012    LEAs; R anterior tibial artery appeared occluded; L posterior tibial shows short segment of occlusive ds  . History of Doppler ultrasound 05/22/2012    Abdominal Aortic Doppler; slight increase in fusiform aneurysm   . Stroke   . Adjustment disorder with mixed anxiety and depressed mood 04/18/2013    Post CVA depression and anxiety   . Abnormality of gait 04/18/2013  . 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 Increased exposure of the right cornea.   . Pruritic condition 01/24/2013  . Memory change 01/24/2013  . Abnormal PFTs 10/30/2012    Followed in Pulmonary clinic/ [facility]/ [place]  - PFT's 10/22/12 VC  55% no obst, DLCO 50%  - PFT's 12/20/2012 VC 83% and no obst, dLCO 55%  -11/08/2012  Walked RA x 3 laps @ 185 ft each stopped due to  End of study, not desat -11/08/12 esr 10    . Impotence of organic origin 10/03/2000  . Osteoarthrosis, unspecified whether generalized or localized, unspecified site 02/05/2003  . Hypertrophy of prostate with urinary obstruction and other lower urinary tract symptoms (LUTS) 04/28/2004  . Gout, unspecified 11/03/2004  . Acute on chronic systolic congestive heart failure, NYHA class 2 03/16/2006    BNP 376.9 05/28/13   . Atherosclerosis  of renal artery 10/18/2006  . Unspecified vitamin D deficiency 10/18/2006  . Obstructive sleep apnea (adult) (pediatric) 07/23/2008  . Major depressive disorder, single episode, unspecified 03/05/2009  . Basal cell carcinoma of skin of other and unspecified parts of face 12/24/2009  . Internal hemorrhoids without mention of complication 80/88/1103  . Muscle weakness (generalized) 03/10/2011  . Pain in joint, lower leg 08/04/2011  . Nocturia 10/27/2011  . Aortic aneurysm of unspecified site without mention of rupture 10/27/2011  . Fall 03/04/2011  . Hyponatremia 03/04/2011  . TIA (transient ischemic attack) 04/30/2013  . Fatigue 04/18/2013  . 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 CVA - Rt brain stroke 12/14 02/18/2013    02/19/13 angiography CT head:  Diffuse atherosclerotic irregularity and plaque formation of the distal right common carotid artery and proximal internal carotid artery without significant stenosis. Plaque ulceration is  present and is a source of emboli. A small right thalamic CVA    . Dyslipidemia 11/03/2004  . Type II or unspecified type diabetes mellitus without mention of complication, not stated as uncontrolled 11/24/2004  . Spinal stenosis in cervical region 12/15/2004  . Right bundle branch block 04/19/2006  . Cardiac pacemaker in situ- MDT 10/11 03/18/2010    Medtronic revo implant in October 2011. Severe sinus bradycardia in the 30s, but not truly pacemaker dependent   . Long term (current) use of anticoagulants 03/17/2011  . Hypothyroidism 03/04/2011  . HTN (hypertension) 03/04/2011  . Atrial fibrillation 03/04/2011     He was on coumadin until his stroke last December and was switched to apixaban 2.22m daily which he has been taking faithfully without reported side effects.      Past Surgical History  Procedure Laterality Date  . Coronary artery bypass graft  1989  . Pacemaker insertion  2011  . Insert / replace /  remove pacemaker    . Eye surgery    . Joint replacement    . Hernia repair    . Coronary angioplasty with stent placement  2008    stent to SVG to OM    FAMHx:  Family History  Problem Relation Age of Onset  . Diabetes Father   . Other Mother     PAD with amputations    SOCHx:   reports that he quit smoking about 46 years ago. His smoking use included Cigarettes. He has a 11.25 pack-year smoking history. He has never used smokeless tobacco. He reports that he does not drink alcohol or use illicit drugs.  ALLERGIES:  Allergies  Allergen Reactions  . Caduet [Amlodipine-Atorvastatin] Other (See Comments)    Weakness   . Crestor [Rosuvastatin] Other (See Comments)    Muscle pain/weakness  . Cymbalta [Duloxetine Hcl] Other (See Comments)    Excessive sedation  . Lipitor [Atorvastatin Calcium] Other (See Comments)    Muscle pain/weakness  . Lisinopril Other (See Comments)    Doesn't remember reaction  . Simvastatin Other (See Comments)    Muscle pain/weakness    ROS: A comprehensive review of systems was negative except for: Respiratory: positive for dyspnea on exertion Cardiovascular: positive for irregular heart beat and lower extremity edema  HOME MEDS: Current Outpatient Prescriptions  Medication Sig Dispense Refill  . acetaminophen (TYLENOL) 325 MG tablet Take 2 tablets (650 mg total) by mouth 2 (two) times daily. DO NOT EXCEED 4 TABLETS DAILY  360 tablet  1  . allopurinol (ZYLOPRIM) 100 MG tablet Take 50 mg by mouth daily.       .Marland Kitchenamoxicillin (AMOXIL) 500 MG capsule Take 500 mg by mouth. Take 4 caps prior to dential      . apixaban (ELIQUIS) 5 MG TABS tablet Take 1 tablet (5 mg total) by mouth 2 (two) times daily.  60 tablet  1  . Cholecalciferol (VITAMIN D) 400 UNITS capsule Take 400 Units by mouth 2 (two) times daily.       . furosemide (LASIX) 20 MG tablet Take 1 tablet (20 mg total) by mouth 2 (two) times daily.  180 tablet  1  . hydrOXYzine (ATARAX/VISTARIL) 10  MG tablet Take 10 mg by mouth 4 (four) times daily as needed.       .Marland Kitchenipratropium (ATROVENT) 0.03 % nasal spray Place 1 spray into both nostrils 3 (three) times daily as needed (post nasal drip).       .Marland Kitchenlevothyroxine (SYNTHROID, LEVOTHROID) 100 MCG  tablet Take 100 mcg by mouth daily before breakfast.      . loratadine (CLARITIN) 10 MG tablet Take 10 mg by mouth daily.      Marland Kitchen LORazepam (ATIVAN) 0.5 MG tablet 0.5 mg at bedtime. And q6hr prn.      . losartan (COZAAR) 100 MG tablet Take 1 tablet (100 mg total) by mouth daily.  30 tablet  5  . metoprolol tartrate (LOPRESSOR) 25 MG tablet Take 3 tablets (75 mg total) by mouth 2 (two) times daily.  120 tablet  0  . nitroGLYCERIN (NITROSTAT) 0.4 MG SL tablet Place 0.4 mg under the tongue every 5 (five) minutes as needed for chest pain.       . polyvinyl alcohol (LIQUIFILM TEARS) 1.4 % ophthalmic solution Place 1 drop into both eyes 4 (four) times daily as needed for dry eyes.      . potassium chloride (MICRO-K) 10 MEQ CR capsule Take 10 mEq by mouth 2 (two) times daily.      . Skin Protectants, Misc. (EUCERIN) cream Apply 1 application topically daily as needed for dry skin (itching).      . zolpidem (AMBIEN) 10 MG tablet Take 2.5-5 mg by mouth at bedtime as needed for sleep.       No current facility-administered medications for this visit.    LABS/IMAGING: No results found for this or any previous visit (from the past 48 hour(s)). No results found.  VITALS: BP 124/68  Pulse 66  Ht 5' 8" (1.727 m)  Wt 159 lb 6.4 oz (72.303 kg)  BMI 24.24 kg/m2  EXAM: GEN: Awake, NAD HEENT: PERRLA, right lid appears retracted, right eye proptotic PULM: lungs with decreased BS at bases CV: Regular rate and rhythm, s1/s2 GI: Abdomen soft, non-tender, +BS VASCULAR: 2+ pulses, right CEA incision noted NEURO: right facial droop, slightly slurred speech PSYCH: Mood, affect normal, pleasant MSK: good ROM, strength 5/5 LE bilaterally, right handgrip 4/5 DERM:  chronic LE venous stasis changes, bilateral trace edema  EKG: V-paced at 66, underlying a-fib  ASSESSMENT: 1. Acute on chronic systolic, congestive heart failure, NYHA Class II symptoms 2. Persistent atrial fibrillation 3. Sick sinus syndrome status post pacemaker placement with normal function 4. Abnormal PFT's - off of amiodarone 5. History of abdominal aortic aneurysm with mild enlargement 6. Coronary artery disease status post CABG in 1989 with stent to the vein graft in 2008 7. Minimal PAD with normal ABIs bilaterally 8. Recent thalamic stroke - now on Eliquis 9. Acute on chronic, systolic congestive heart failure - EF 40-45%  PLAN: 1.   Jon [name] appears to be more euvolemic with the increased dose of his diuretics. His weight is now down 5 pounds since his last office visit. I provided orders for his assisted-living facility to check daily weights and contact us if his weight goes up by more than 3 pounds over one to 2 days. He may need dosing changes in his Lasix due to this. Overall he seems to be doing pretty well. I would recommend continuing his current dose of medications and we'll see him back in 6 months.  Pixie Casino, MD, [facility] Attending Cardiologist [facility] [place] 07/04/2013, 5:32 PM

## 2013-07-05 DIAGNOSIS — M6281 Muscle weakness (generalized): Secondary | ICD-10-CM | POA: Diagnosis not present

## 2013-07-05 DIAGNOSIS — R29818 Other symptoms and signs involving the nervous system: Secondary | ICD-10-CM | POA: Diagnosis not present

## 2013-07-08 DIAGNOSIS — D1801 Hemangioma of skin and subcutaneous tissue: Secondary | ICD-10-CM | POA: Diagnosis not present

## 2013-07-08 DIAGNOSIS — Q828 Other specified congenital malformations of skin: Secondary | ICD-10-CM | POA: Diagnosis not present

## 2013-07-08 DIAGNOSIS — L538 Other specified erythematous conditions: Secondary | ICD-10-CM | POA: Diagnosis not present

## 2013-07-08 DIAGNOSIS — Z85828 Personal history of other malignant neoplasm of skin: Secondary | ICD-10-CM | POA: Diagnosis not present

## 2013-07-08 DIAGNOSIS — L821 Other seborrheic keratosis: Secondary | ICD-10-CM | POA: Diagnosis not present

## 2013-07-08 DIAGNOSIS — L570 Actinic keratosis: Secondary | ICD-10-CM | POA: Diagnosis not present

## 2013-07-09 DIAGNOSIS — R29818 Other symptoms and signs involving the nervous system: Secondary | ICD-10-CM | POA: Diagnosis not present

## 2013-07-09 DIAGNOSIS — M6281 Muscle weakness (generalized): Secondary | ICD-10-CM | POA: Diagnosis not present

## 2013-07-10 DIAGNOSIS — M6281 Muscle weakness (generalized): Secondary | ICD-10-CM | POA: Diagnosis not present

## 2013-07-10 DIAGNOSIS — R29818 Other symptoms and signs involving the nervous system: Secondary | ICD-10-CM | POA: Diagnosis not present

## 2013-07-11 ENCOUNTER — Non-Acute Institutional Stay: Payer: Medicare Other | Admitting: Nurse Practitioner

## 2013-07-11 ENCOUNTER — Encounter: Payer: Self-pay | Admitting: Nurse Practitioner

## 2013-07-11 DIAGNOSIS — M6281 Muscle weakness (generalized): Secondary | ICD-10-CM | POA: Diagnosis not present

## 2013-07-11 DIAGNOSIS — I4891 Unspecified atrial fibrillation: Secondary | ICD-10-CM | POA: Diagnosis not present

## 2013-07-11 DIAGNOSIS — I10 Essential (primary) hypertension: Secondary | ICD-10-CM | POA: Diagnosis not present

## 2013-07-11 DIAGNOSIS — Z7901 Long term (current) use of anticoagulants: Secondary | ICD-10-CM

## 2013-07-11 DIAGNOSIS — L299 Pruritus, unspecified: Secondary | ICD-10-CM

## 2013-07-11 DIAGNOSIS — R040 Epistaxis: Secondary | ICD-10-CM | POA: Diagnosis not present

## 2013-07-11 DIAGNOSIS — E039 Hypothyroidism, unspecified: Secondary | ICD-10-CM

## 2013-07-11 DIAGNOSIS — F329 Major depressive disorder, single episode, unspecified: Secondary | ICD-10-CM

## 2013-07-11 DIAGNOSIS — R29818 Other symptoms and signs involving the nervous system: Secondary | ICD-10-CM | POA: Diagnosis not present

## 2013-07-11 DIAGNOSIS — R609 Edema, unspecified: Secondary | ICD-10-CM

## 2013-07-11 DIAGNOSIS — M1990 Unspecified osteoarthritis, unspecified site: Secondary | ICD-10-CM

## 2013-07-11 DIAGNOSIS — M109 Gout, unspecified: Secondary | ICD-10-CM

## 2013-07-11 NOTE — Assessment & Plan Note (Signed)
Metoprolol 75 mg twice a day and Losartan 100mg  daily and Furosemide 20mg  bid-mild elevated SBP in 150s  Average.

## 2013-07-11 NOTE — Assessment & Plan Note (Signed)
takes Levothyroxine 75mcg, TSH 3.597 01/08/13 and 3.153 05/16/13  

## 2013-07-11 NOTE — Assessment & Plan Note (Signed)
Rate controlled, takes Metoprolol 75mg bid.    

## 2013-07-11 NOTE — Assessment & Plan Note (Signed)
Left knee-chronic-worse-no erythema or swelling-Tylenol 650mg  bid. Hx of the total right knee replacement. W/c for mobility.

## 2013-07-11 NOTE — Assessment & Plan Note (Signed)
Dc prn Atrovent nasal spray-no apparent nasal drainage seen. Will apply trip ABT ointment to nasal linings. Flonase nasal spray daily. Observe.

## 2013-07-11 NOTE — Assessment & Plan Note (Signed)
difficulty falling asleep--better, takes Ativan 0.5mg  nightly-not taking prn Ambien this month. Lorazepam 0.5mg  hs prn and q6hr prn available to him for anxiety.

## 2013-07-11 NOTE — Assessment & Plan Note (Signed)
Chronic, better-takes Claritin 10mg  daily and Hydroxyzine to 10mg  qid prn-observe the patient. No longer c/o drowsiness.

## 2013-07-11 NOTE — Assessment & Plan Note (Signed)
Eliquis 5 mg bid

## 2013-07-11 NOTE — Progress Notes (Signed)
Patient ID: Jon [name], male   DOB: 09-30-1921, 80 y.o.   MRN: [id number]   Code Status: DNR  Allergies  Allergen Reactions  . Caduet [Amlodipine-Atorvastatin] Other (See Comments)    Weakness   . Crestor [Rosuvastatin] Other (See Comments)    Muscle pain/weakness  . Cymbalta [Duloxetine Hcl] Other (See Comments)    Excessive sedation  . Lipitor [Atorvastatin Calcium] Other (See Comments)    Muscle pain/weakness  . Lisinopril Other (See Comments)    Doesn't remember reaction  . Simvastatin Other (See Comments)    Muscle pain/weakness    Chief Complaint  Patient presents with  . Medical Management of Chronic Issues  . Acute Visit    nose bleed left.     HPI: Patient is a 80 y.o. male seen in the [facility]  at [facility] today for evaluation of nose bleed and other chronic medical conditions.   Hospitalized from 04/30/2013 to 05/03/2013 for RLE weakness which lasted about 30 minutes. TIA was considered given nature of the problem and CT brain findings/MRI and MRA brain. He was discharged with increased Eliquis and adjusting of antihypertensive medications.   Problem List Items Addressed This Visit   Atrial fibrillation (Chronic)     Rate controlled, takes Metoprolol 35m bid.      HTN (hypertension) (Chronic)     Metoprolol 75 mg twice a day and Losartan 1065mdaily and Furosemide 2046mid-mild elevated SBP in 150s  Average.      Hypothyroidism (Chronic)     takes Levothyroxine 32m12mTSH 3.597 01/08/13 and 3.153 05/16/13       Long term (current) use of anticoagulants (Chronic)     Eliquis 5mg 39m.       Major depressive disorder, single episode, unspecified     difficulty falling asleep--better, takes Ativan 0.5mg n43mtly-not taking prn Ambien this month. Lorazepam 0.5mg hs69mn and q6hr prn available to him for anxiety.       Gout, unspecified     No flare ups. Takes Allopurinol 50mg da58m     Osteoarthrosis, unspecified whether generalized or  localized, unspecified site     Left knee-chronic-worse-no erythema or swelling-Tylenol 650mg bid29m of the total right knee replacement. W/c for mobility.       Pruritic condition     Chronic, better-takes Claritin 10mg dail27md Hydroxyzine to 10mg qid p86mbserve the patient. No longer c/o drowsiness.      Edema     Most of swelling is localized in the lateral right ankle area-improved since Furosemide 20mg bid.  66m Epistaxis - Primary     Dc prn Atrovent nasal spray-no apparent nasal drainage seen. Will apply trip ABT ointment to nasal linings. Flonase nasal spray daily. Observe.        Review of Systems:  Review of Systems  Constitutional: Negative for fever, chills, weight loss, malaise/fatigue and diaphoresis.  HENT: Positive for hearing loss and nosebleeds. Negative for congestion, ear discharge, ear pain, sore throat and tinnitus.        Left nostril-not new.  Eyes: Negative for blurred vision, double vision, photophobia, pain, discharge and redness.       Dry eyes. Especially the right eye since its incomplete closure since the CVA  Respiratory: Positive for shortness of breath. Negative for cough, hemoptysis, sputum production, wheezing and stridor.        C/o more SOB-desires O2 Sat check >905 RA so far-no further complaints  Cardiovascular: Positive for leg  swelling and PND. Negative for chest pain, palpitations, orthopnea and claudication.       Most prominent the right lateral ankle-better since Furosemide 67m bid.   Gastrointestinal: Negative for heartburn, nausea, abdominal pain, diarrhea, constipation and blood in stool.  Genitourinary: Positive for urgency and frequency. Negative for dysuria, hematuria and flank pain.       Cannot make it to commode sometimes. Kegel exercise and adult depends.   Musculoskeletal: Positive for joint pain. Negative for back pain, falls, myalgias and neck pain.       Left knee pain.   Skin: Positive for itching. Negative for  rash.       Chronic, takes Claritin daily, has Hydroxyzine 173mqid prn available to him.   Neurological: Positive for speech change and focal weakness. Negative for dizziness, tingling, tremors, sensory change, seizures, loss of consciousness, weakness and headaches.       Mild R facial droop and right sided weakness. Grip strength 5/5-but weaker than the left. Unsteady gait. Aphasia is better.   Endo/Heme/Allergies: Positive for environmental allergies. Negative for polydipsia. Does not bruise/bleed easily.  Psychiatric/Behavioral: Positive for depression and memory loss. Negative for suicidal ideas, hallucinations and substance abuse. The patient is nervous/anxious and has insomnia.        Sleeps better-no prn Ambien required this month.      Past Medical History  Diagnosis Date  . Hypertension   . Coronary artery disease   . Arthritis   . Cataract   . Anxiety   . Blood transfusion without reported diagnosis   . Hyperlipidemia   . Heart murmur   . Allergy   . Thyroid disease   . PAF (paroxysmal atrial fibrillation)     coumadin  . History of abdominal aortic aneurysm   . History of echocardiogram 12/2008    EF >55%; mild concentric LVH; mild MR; mild-mod TR; mild AV regurg;   . History of nuclear stress test 12/2010    lexiscan; low risk; compared to prior study, perfusion improved  . History of Doppler ultrasound 05/22/2012    LEAs; R anterior tibial artery appeared occluded; L posterior tibial shows short segment of occlusive ds  . History of Doppler ultrasound 05/22/2012    Abdominal Aortic Doppler; slight increase in fusiform aneurysm   . Stroke   . Adjustment disorder with mixed anxiety and depressed mood 04/18/2013    Post CVA depression and anxiety   . Abnormality of gait 04/18/2013  . 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 Increased exposure of the right cornea.   . Pruritic condition 01/24/2013  . Memory change 01/24/2013  . Abnormal PFTs  10/30/2012    Followed in Pulmonary clinic/ [facility]/ [place]  - PFT's 10/22/12 VC  55% no obst, DLCO 50%  - PFT's 12/20/2012 VC 83% and no obst, dLCO 55%  -11/08/2012  Walked RA x 3 laps @ 185 ft each stopped due to  End of study, not desat -11/08/12 esr 10    . Impotence of organic origin 10/03/2000  . Osteoarthrosis, unspecified whether generalized or localized, unspecified site 02/05/2003  . Hypertrophy of prostate with urinary obstruction and other lower urinary tract symptoms (LUTS) 04/28/2004  . Gout, unspecified 11/03/2004  . Acute on chronic systolic congestive heart failure, NYHA class 2 03/16/2006    BNP 376.9 05/28/13   . Atherosclerosis of renal artery 10/18/2006  . Unspecified vitamin D deficiency 10/18/2006  . Obstructive sleep apnea (adult) (pediatric) 07/23/2008  . Major  depressive disorder, single episode, unspecified 03/05/2009  . Basal cell carcinoma of skin of other and unspecified parts of face 12/24/2009  . Internal hemorrhoids without mention of complication 57/84/6962  . Muscle weakness (generalized) 03/10/2011  . Pain in joint, lower leg 08/04/2011  . Nocturia 10/27/2011  . Aortic aneurysm of unspecified site without mention of rupture 10/27/2011  . Fall 03/04/2011  . Hyponatremia 03/04/2011  . TIA (transient ischemic attack) 04/30/2013  . Fatigue 04/18/2013  . 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 CVA - Rt brain stroke 12/14 02/18/2013    02/19/13 angiography CT head:  Diffuse atherosclerotic irregularity and plaque formation of the distal right common carotid artery and proximal internal carotid artery without significant stenosis. Plaque ulceration is present and is a source of emboli. A small right thalamic CVA    . Dyslipidemia 11/03/2004  . Type II or unspecified type diabetes mellitus without mention of complication, not stated as uncontrolled 11/24/2004  . Spinal stenosis in cervical region 12/15/2004  . Right bundle  branch block 04/19/2006  . Cardiac pacemaker in situ- MDT 10/11 03/18/2010    Medtronic revo implant in October 2011. Severe sinus bradycardia in the 30s, but not truly pacemaker dependent   . Long term (current) use of anticoagulants 03/17/2011  . Hypothyroidism 03/04/2011  . HTN (hypertension) 03/04/2011  . Atrial fibrillation 03/04/2011     He was on coumadin until his stroke last December and was switched to apixaban 2.59m daily which he has been taking faithfully without reported side effects.     Past Surgical History  Procedure Laterality Date  . Coronary artery bypass graft  1989  . Pacemaker insertion  2011  . Insert / replace / remove pacemaker    . Eye surgery    . Joint replacement    . Hernia repair    . Coronary angioplasty with stent placement  2008    stent to SVG to OM   Social History:   reports that he quit smoking about 46 years ago. His smoking use included Cigarettes. He has a 11.25 pack-year smoking history. He has never used smokeless tobacco. He reports that he does not drink alcohol or use illicit drugs.  Family History  Problem Relation Age of Onset  . Diabetes Father   . Other Mother     PAD with amputations    Medications: Patient's Medications  New Prescriptions   No medications on file  Previous Medications   ACETAMINOPHEN (TYLENOL) 325 MG TABLET    Take 2 tablets (650 mg total) by mouth 2 (two) times daily. DO NOT EXCEED 4 TABLETS DAILY   ALLOPURINOL (ZYLOPRIM) 100 MG TABLET    Take 50 mg by mouth daily.    AMOXICILLIN (AMOXIL) 500 MG CAPSULE    Take 500 mg by mouth. Take 4 caps prior to dential   APIXABAN (ELIQUIS) 5 MG TABS TABLET    Take 1 tablet (5 mg total) by mouth 2 (two) times daily.   CHOLECALCIFEROL (VITAMIN D) 400 UNITS CAPSULE    Take 400 Units by mouth 2 (two) times daily.    FUROSEMIDE (LASIX) 20 MG TABLET    Take 1 tablet (20 mg total) by mouth 2 (two) times daily.   HYDROXYZINE (ATARAX/VISTARIL) 10 MG TABLET    Take 10 mg by mouth  4 (four) times daily as needed.    LEVOTHYROXINE (SYNTHROID, LEVOTHROID) 100 MCG TABLET    Take 100 mcg  by mouth daily before breakfast.   LORATADINE (CLARITIN) 10 MG TABLET    Take 10 mg by mouth daily.   LORAZEPAM (ATIVAN) 0.5 MG TABLET    0.5 mg at bedtime. And q6hr prn.   LOSARTAN (COZAAR) 100 MG TABLET    Take 1 tablet (100 mg total) by mouth daily.   METOPROLOL TARTRATE (LOPRESSOR) 25 MG TABLET    Take 3 tablets (75 mg total) by mouth 2 (two) times daily.   NITROGLYCERIN (NITROSTAT) 0.4 MG SL TABLET    Place 0.4 mg under the tongue every 5 (five) minutes as needed for chest pain.    POLYVINYL ALCOHOL (LIQUIFILM TEARS) 1.4 % OPHTHALMIC SOLUTION    Place 1 drop into both eyes 4 (four) times daily as needed for dry eyes.   POTASSIUM CHLORIDE (MICRO-K) 10 MEQ CR CAPSULE    Take 10 mEq by mouth 2 (two) times daily.   SKIN PROTECTANTS, MISC. (EUCERIN) CREAM    Apply 1 application topically daily as needed for dry skin (itching).   ZOLPIDEM (AMBIEN) 10 MG TABLET    Take 2.5-5 mg by mouth at bedtime as needed for sleep.  Modified Medications   No medications on file  Discontinued Medications   IPRATROPIUM (ATROVENT) 0.03 % NASAL SPRAY    Place 1 spray into both nostrils 3 (three) times daily as needed (post nasal drip).    Physical Exam: Physical Exam  Nursing note and vitals reviewed. Constitutional: He is oriented to person, place, and time. He appears well-developed and well-nourished. No distress.  W/c for generalized weakness and left knee pain. Ambulates with walker today.   HENT:  Severe hearing loss. Bilateral aides.Right facial droop. Left nostril bleed.   Eyes:  Corrective lenses. Increased exposure of the right cornea due to post CVA facial droop.  Neck: No JVD present. No tracheal deviation present. No thyromegaly present.  Cardiovascular: Normal rate and normal heart sounds.  Frequent extrasystoles are present.  No murmur heard. Pacemaker left upper chest  Pulmonary/Chest:  Effort normal. He has no wheezes. He has rales.  dry rales posterior lung bases.   Abdominal: Soft. Bowel sounds are normal. He exhibits no distension and no mass. There is no tenderness.  Musculoskeletal: Normal range of motion. He exhibits edema and tenderness.  Unstable gait. Using 4 wheel walker and w/c. Weaker on the right side. Left knee pain w/o swelling or redness or heat. Hx of the right total knee replacement. Edema noted at the lateral right ankle-better since Furosemide 25m bid.   Lymphadenopathy:    He has no cervical adenopathy.  Neurological: He is alert and oriented to person, place, and time. He displays normal reflexes. No cranial nerve deficit. He exhibits normal muscle tone. Coordination normal.  Slurred speech. Right side weakness.  Skin: Skin is warm and dry. No rash noted. No erythema. No pallor.  Psychiatric: He has a normal mood and affect. His behavior is normal. Judgment and thought content normal.   Filed Vitals:   07/11/13 1024  BP: 148/80  Pulse: 66  Temp: 98.4 F (36.9 C)  TempSrc: Tympanic  Resp: 20   Labs reviewed: Basic Metabolic Panel:  Recent Labs  02/22/13 0943  03/27/13 1636 04/30/13 1424 04/30/13 1425 05/14/13 05/16/13 05/28/13  NA 130*  < > 134* 141  --  139  --  140  K 4.0  < > 4.7 4.2  --  4.5  --  4.0  CL 97  --  99 104  --   --   --   --  CO2 23  --  25 24  --   --   --   --   GLUCOSE 131*  --  107* 111*  --   --   --   --   BUN 14  < > 25* 28*  --  19  --  25*  CREATININE 0.74  < > 1.18 1.11  --  1.1  --  1.1  CALCIUM 8.7  --  9.7 9.0  --   --   --   --   MG  --   --  2.3  --  2.2  --   --   --   TSH  --   --  6.812*  --   --  3.68 3.15  --   < > = values in this interval not displayed. Liver Function Tests:  Recent Labs  02/18/13 2025 03/27/13 1636 04/30/13 1424 05/14/13  AST _0 ALT _1 ALKPHOS 90 81 81 86  BILITOT 0.7 1.1 0.7  --   PROT 6.7 6.8 6.4  --   ALBUMIN 3.6 4.3 3.3*  --     CBC:  Recent Labs  11/08/12 1611 02/18/13 2025 02/18/13 2032 03/27/13 1636 04/30/13 1424  WBC 7.2 6.9  --  9.0 6.9  NEUTROABS 4.7 3.7  --   --  4.5  HGB 16.0 15.5 16.3 16.5 15.7  HCT 47.4 43.1 48.0 47.2 44.6  MCV 95.4 92.3  --  93.7 92.9  PLT 260.0 217  --  241 207   Lipid Panel:  Recent Labs  02/19/13 0355 03/26/13 05/01/13 0528  CHOL 187 89 117  HDL 34* 31* 30*  LDLCALC 117* 47 71  TRIG 181* 54 79  CHOLHDL 5.5  --  3.9   Past Procedures:   04/30/2013   CLINICAL DATA:  Stroke.  Ex-smoker.  EXAM: CHEST  2 VIEW  .  IMPRESSION: Minimal bibasilar linear atelectasis.     04/30/2013   CLINICAL DATA:  TRANSIENT ISCHEMIC ATTACK  EXAM: CT HEAD WITHOUT CONTRAST  IMPRESSION: Findings consistent with an area of subacute to chronic lacunar infarction left MCA distribution. Involutional and chronic changes also appreciated. There is no evidence of acute abnormalities.     05/01/2013    EXAM: MRI HEAD WITHOUT CONTRAST  MRA HEAD WITHOUT CONTRAST    IMPRESSION: 1. Small acute/subacute infarct left corona radiata. No mass effect or hemorrhage. 2. Three superimposed punctate acute lacunar infarcts (right caudate nucleus, left occipital pole, left left parietal lobe). These could reflect synchronous small vessel ischemia rather than a recent embolic event. 3. Underlying chronic small vessel ischemia. 4. Intracranial atherosclerosis. Fusiform aneurysmal enlargement of the left ICA (cavernous segment) and right ICA terminus. No anterior circulation major branch occlusion. 5. Atherosclerosis in stenosis of the distal right vertebral artery which appears hemodynamically significant. Other posterior circulation atherosclerosis without stenosis.     Mr Jodene Nam Head/brain Wo Cm  05/01/2013  EXAM: MRI HEAD WITHOUT CONTRAST  MRA HEAD WITHOUT CONTRAST   IMPRESSION: 1. Small acute/subacute infarct left corona radiata. No mass effect or hemorrhage. 2. Three superimposed punctate acute lacunar infarcts (right  caudate nucleus, left occipital pole, left left parietal lobe). These could reflect synchronous small vessel ischemia rather than a recent embolic event. 3. Underlying chronic small vessel ischemia. 4. Intracranial atherosclerosis. Fusiform aneurysmal enlargement of the left ICA (cavernous segment) and right ICA terminus. No anterior circulation major branch occlusion. 5. Atherosclerosis in stenosis of the  distal right vertebral artery which appears hemodynamically significant. Other posterior circulation atherosclerosis without stenosis.     EKG Atrial fibrillation Right bundle branch block ST depr, consider ischemia, anterolateral lds Baseline wander in lead(s) II III aVR aVL aVF V3   Assessment/Plan Epistaxis Dc prn Atrovent nasal spray-no apparent nasal drainage seen. Will apply trip ABT ointment to nasal linings. Flonase nasal spray daily. Observe.   Atrial fibrillation Rate controlled, takes Metoprolol 64m bid.    HTN (hypertension) Metoprolol 75 mg twice a day and Losartan 108mdaily and Furosemide 2048mid-mild elevated SBP in 150s  Average.    Hypothyroidism takes Levothyroxine 33m60mTSH 3.597 01/08/13 and 3.153 05/16/13     Long term (current) use of anticoagulants Eliquis 5mg 34m.     Gout, unspecified No flare ups. Takes Allopurinol 50mg 58my.   Edema Most of swelling is localized in the lateral right ankle area-improved since Furosemide 20mg b33m    Pruritic condition Chronic, better-takes Claritin 10mg da71mand Hydroxyzine to 10mg qid39m-observe the patient. No longer c/o drowsiness.    Osteoarthrosis, unspecified whether generalized or localized, unspecified site Left knee-chronic-worse-no erythema or swelling-Tylenol 650mg bid.56mof the total right knee replacement. W/c for mobility.     Major depressive disorder, single episode, unspecified difficulty falling asleep--better, takes Ativan 0.5mg nightl77mot taking prn Ambien this month. Lorazepam  0.5mg hs prn 61m q6hr prn available to him for anxiety.       Family/ Staff Communication: observe the patient.   Goals of Care: AL  Labs/tests ordered: none

## 2013-07-11 NOTE — Assessment & Plan Note (Signed)
No flare ups. Takes Allopurinol 50mg daily.   

## 2013-07-11 NOTE — Assessment & Plan Note (Signed)
Most of swelling is localized in the lateral right ankle area-improved since Furosemide 20mg bid.   

## 2013-07-12 ENCOUNTER — Telehealth: Payer: Self-pay | Admitting: *Deleted

## 2013-07-12 NOTE — Telephone Encounter (Signed)
Faxed recommendation for using nasal saline spray 1-2x daily in each nostril

## 2013-07-16 DIAGNOSIS — R29818 Other symptoms and signs involving the nervous system: Secondary | ICD-10-CM | POA: Diagnosis not present

## 2013-07-16 DIAGNOSIS — M6281 Muscle weakness (generalized): Secondary | ICD-10-CM | POA: Diagnosis not present

## 2013-07-19 DIAGNOSIS — M6281 Muscle weakness (generalized): Secondary | ICD-10-CM | POA: Diagnosis not present

## 2013-07-19 DIAGNOSIS — R29818 Other symptoms and signs involving the nervous system: Secondary | ICD-10-CM | POA: Diagnosis not present

## 2013-07-23 ENCOUNTER — Ambulatory Visit: Payer: Medicare Other | Admitting: Neurology

## 2013-07-26 ENCOUNTER — Telehealth: Payer: Self-pay | Admitting: Cardiology

## 2013-07-26 ENCOUNTER — Ambulatory Visit (INDEPENDENT_AMBULATORY_CARE_PROVIDER_SITE_OTHER): Payer: Medicare Other | Admitting: *Deleted

## 2013-07-26 DIAGNOSIS — I4891 Unspecified atrial fibrillation: Secondary | ICD-10-CM

## 2013-07-26 DIAGNOSIS — I498 Other specified cardiac arrhythmias: Secondary | ICD-10-CM

## 2013-07-26 NOTE — Telephone Encounter (Signed)
LMOVM reminding pt to send remote transmission.   

## 2013-07-30 ENCOUNTER — Telehealth: Payer: Self-pay | Admitting: Internal Medicine

## 2013-07-30 DIAGNOSIS — I4891 Unspecified atrial fibrillation: Secondary | ICD-10-CM | POA: Diagnosis not present

## 2013-07-30 DIAGNOSIS — I498 Other specified cardiac arrhythmias: Secondary | ICD-10-CM

## 2013-07-30 NOTE — Progress Notes (Signed)
Remote ICD transmission.   

## 2013-07-30 NOTE — Telephone Encounter (Signed)
RN spoke to patient. Patient states he is returning phone call RN states that Pamala Hurry from the  [facility] called him about sending a transmission. Patient asked what to do about the transmission. RN informed patient that a message will be sent to device clinic to assist him. He states he will be in and about  Today but please call and leave a message and phone number  Forward to [facility], and device clinic

## 2013-07-30 NOTE — Telephone Encounter (Signed)
Pt says he was returning [facility] call from Friday.

## 2013-07-30 NOTE — Telephone Encounter (Signed)
Pt informed on how to send remote transmission from home pt verbalized understanding and started to send remote transmission at that time.

## 2013-08-07 LAB — MDC_IDC_ENUM_SESS_TYPE_REMOTE
Brady Statistic AP VP Percent: 0.63 %
Brady Statistic AP VS Percent: 0.31 %
Brady Statistic AS VS Percent: 54.28 %
Date Time Interrogation Session: 20150526142005
Lead Channel Impedance Value: 464 Ohm
Lead Channel Setting Pacing Amplitude: 2 V
Lead Channel Setting Pacing Amplitude: 2.5 V
Lead Channel Setting Sensing Sensitivity: 0.9 mV
MDC IDC MSMT BATTERY VOLTAGE: 2.99 V
MDC IDC MSMT LEADCHNL RV IMPEDANCE VALUE: 456 Ohm
MDC IDC MSMT LEADCHNL RV SENSING INTR AMPL: 8.464 mV
MDC IDC SET LEADCHNL RV PACING PULSEWIDTH: 0.4 ms
MDC IDC STAT BRADY AS VP PERCENT: 44.78 %
MDC IDC STAT BRADY RA PERCENT PACED: 0.95 %
MDC IDC STAT BRADY RV PERCENT PACED: 45.41 %
Zone Setting Detection Interval: 350 ms
Zone Setting Detection Interval: 400 ms

## 2013-08-13 ENCOUNTER — Encounter: Payer: Self-pay | Admitting: Neurology

## 2013-08-14 ENCOUNTER — Encounter: Payer: Self-pay | Admitting: Cardiology

## 2013-08-15 ENCOUNTER — Encounter: Payer: Self-pay | Admitting: Internal Medicine

## 2013-08-15 ENCOUNTER — Other Ambulatory Visit: Payer: Self-pay | Admitting: Nurse Practitioner

## 2013-08-15 ENCOUNTER — Non-Acute Institutional Stay: Payer: Medicare Other | Admitting: Internal Medicine

## 2013-08-15 VITALS — BP 162/78 | HR 68 | Wt 162.0 lb

## 2013-08-15 DIAGNOSIS — I10 Essential (primary) hypertension: Secondary | ICD-10-CM

## 2013-08-15 DIAGNOSIS — I639 Cerebral infarction, unspecified: Secondary | ICD-10-CM

## 2013-08-15 DIAGNOSIS — J3489 Other specified disorders of nose and nasal sinuses: Secondary | ICD-10-CM | POA: Insufficient documentation

## 2013-08-15 DIAGNOSIS — H109 Unspecified conjunctivitis: Secondary | ICD-10-CM

## 2013-08-15 DIAGNOSIS — R609 Edema, unspecified: Secondary | ICD-10-CM | POA: Diagnosis not present

## 2013-08-15 DIAGNOSIS — I4891 Unspecified atrial fibrillation: Secondary | ICD-10-CM

## 2013-08-15 DIAGNOSIS — I2510 Atherosclerotic heart disease of native coronary artery without angina pectoris: Secondary | ICD-10-CM | POA: Diagnosis not present

## 2013-08-15 DIAGNOSIS — L299 Pruritus, unspecified: Secondary | ICD-10-CM

## 2013-08-15 DIAGNOSIS — I6350 Cerebral infarction due to unspecified occlusion or stenosis of unspecified cerebral artery: Secondary | ICD-10-CM

## 2013-08-15 MED ORDER — OLOPATADINE HCL 0.1 % OP SOLN: OPHTHALMIC | Status: DC

## 2013-08-15 MED ORDER — IPRATROPIUM BROMIDE 0.06 % NA SOLN: NASAL | Status: DC

## 2013-08-15 NOTE — Progress Notes (Signed)
Patient ID: Jon [name], male   DOB: 17-Mar-1921, 80 y.o.   MRN: [id number]    Location:  [facility]   Place of Service: Clinic (12)    Allergies  Allergen Reactions  . Caduet [Amlodipine-Atorvastatin] Other (See Comments)    Weakness   . Crestor [Rosuvastatin] Other (See Comments)    Muscle pain/weakness  . Cymbalta [Duloxetine Hcl] Other (See Comments)    Excessive sedation  . Lipitor [Atorvastatin Calcium] Other (See Comments)    Muscle pain/weakness  . Lisinopril Other (See Comments)    Doesn't remember reaction  . Simvastatin Other (See Comments)    Muscle pain/weakness    Chief Complaint  Patient presents with  . Medical Management of Chronic Issues    A-Fib, blood pressure, depression, blood sugar    HPI:   Atrial fibrillation: chronic. Rate controlled.  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 Prior weakness is improved. Speech is still affected, but is improved  HTN (hypertension): mild elevation in SBP at times  Edema: improved, but still present  Pruritic condition: improved  Rhinorrhea: "my worst problem. Starts when I sit down to eat."  Clear. Mucus runs "like water".  Conjunctivitis: Eyes burn every time he goes outdoors. Liquid tears are not helping.    Medications: Patient's Medications  New Prescriptions   No medications on file  Previous Medications   ACETAMINOPHEN (TYLENOL) 325 MG TABLET    Take 2 tablets (650 mg total) by mouth 2 (two) times daily. DO NOT EXCEED 4 TABLETS DAILY   ALLOPURINOL (ZYLOPRIM) 100 MG TABLET    Take 50 mg by mouth daily.    AMOXICILLIN (AMOXIL) 500 MG CAPSULE    Take 500 mg by mouth. Take 4 caps prior to dential   APIXABAN (ELIQUIS) 5 MG TABS TABLET    Take 1 tablet (5 mg total) by mouth 2 (two) times daily.   CHOLECALCIFEROL (VITAMIN D) 400 UNITS CAPSULE    Take 400 Units by mouth 2 (two) times daily.    FLUTICASONE (FLONASE) 50 MCG/ACT NASAL SPRAY    Place into both nostrils daily.   FUROSEMIDE  (LASIX) 20 MG TABLET    Take 1 tablet (20 mg total) by mouth 2 (two) times daily.   LEVOTHYROXINE (SYNTHROID, LEVOTHROID) 100 MCG TABLET    Take 100 mcg by mouth daily before breakfast.   LORATADINE (CLARITIN) 10 MG TABLET    Take 10 mg by mouth daily.   LOSARTAN (COZAAR) 100 MG TABLET    Take 1 tablet (100 mg total) by mouth daily.   METOPROLOL TARTRATE (LOPRESSOR) 25 MG TABLET    Take 3 tablets (75 mg total) by mouth 2 (two) times daily.   NITROGLYCERIN (NITROSTAT) 0.4 MG SL TABLET    Place 0.4 mg under the tongue every 5 (five) minutes as needed for chest pain.    POLYVINYL ALCOHOL (LIQUIFILM TEARS) 1.4 % OPHTHALMIC SOLUTION    Place 1 drop into both eyes 4 (four) times daily as needed for dry eyes.   POTASSIUM CHLORIDE (MICRO-K) 10 MEQ CR CAPSULE    Take 10 mEq by mouth 2 (two) times daily.   SKIN PROTECTANTS, MISC. (EUCERIN) CREAM    Apply 1 application topically daily as needed for dry skin (itching).   ZOLPIDEM (AMBIEN) 10 MG TABLET    Take 2.5-5 mg by mouth at bedtime as needed for sleep.  Modified Medications   No medications on file  Discontinued Medications   No medications on file  Review of Systems  Constitutional: Negative.   HENT: Positive for hearing loss. Negative for congestion, ear discharge, ear pain, rhinorrhea, sinus pressure and tinnitus.   Eyes: Negative.   Respiratory: Negative for cough, choking, chest tightness, shortness of breath and wheezing.   Cardiovascular: Negative for palpitations and leg swelling (sometimes, not apparent today. ).  Gastrointestinal: Negative.  Negative for abdominal pain, diarrhea and abdominal distention.  Endocrine:       Hypothyroid  Genitourinary: Positive for frequency.  Musculoskeletal: Positive for arthralgias and gait problem. Negative for neck pain and neck stiffness.  Skin: Negative.  Negative for pallor and rash.  Allergic/Immunologic: Negative.   Neurological:       Thalamic CVA 02/18/2013. Residual right side  weakness, slurred speech, and depression. Recurrent CVA on 04/30/13 of the left MCA area  Hematological: Negative.   Psychiatric/Behavioral: Negative.     Filed Vitals:   08/15/13 1604  BP: 162/78  Pulse: 68  Weight: 162 lb (73.483 kg)  SpO2: 97%   Body mass index is 24.64 kg/(m^2).  Physical Exam  Nursing note and vitals reviewed. Constitutional: He is oriented to person, place, and time. He appears well-developed and well-nourished. No distress.  W/c for generalized weakness and left knee pain. Ambulates with walker today.   HENT:  Severe hearing loss. Bilateral aides.Right facial droop. Left nostril bleed.   Eyes:  Corrective lenses. Increased exposure of the right cornea due to post CVA facial droop.  Neck: No JVD present. No tracheal deviation present. No thyromegaly present.  Cardiovascular: Normal rate and normal heart sounds.  Frequent extrasystoles are present.  No murmur heard. Pacemaker left upper chest  Pulmonary/Chest: Effort normal. He has no wheezes. He has rales.  dry rales posterior lung bases.   Abdominal: Soft. Bowel sounds are normal. He exhibits no distension and no mass. There is no tenderness.  Musculoskeletal: Normal range of motion. He exhibits edema and tenderness.  Unstable gait. Using 4 wheel walker and w/c. Weaker on the right side. Left knee pain w/o swelling or redness or heat. Hx of the right total knee replacement. Edema noted at the lateral right ankle-better since Furosemide 20mg  bid.   Lymphadenopathy:    He has no cervical adenopathy.  Neurological: He is alert and oriented to person, place, and time. He displays normal reflexes. No cranial nerve deficit. He exhibits normal muscle tone. Coordination normal.  Slurred speech. Right side weakness.  Skin: Skin is warm and dry. No rash noted. No erythema. No pallor.  Psychiatric: He has a normal mood and affect. His behavior is normal. Judgment and thought content normal.     Labs  reviewed: Clinical Support on 07/26/2013  Component Date Value Ref Range Status  . Date Time Interrogation Session 08/07/2013 [id number]   Final  . Pulse Generator Manufacturer 08/07/2013 [employer]   Final  . Pulse Gen Model 08/07/2013 RVDR01 Revo MRI   Final  . Pulse Gen Serial Number 08/07/2013 [id number]   Final  . RV Sense Sensitivity 08/07/2013 0.9   Final  . RA Pace Amplitude 08/07/2013 2   Final  . RV Pace PulseWidth 08/07/2013 0.4   Final  . RV Pace Amplitude 08/07/2013 2.5   Final  . Zone Setting Type Category 08/07/2013 VF   Final  . Zone Setting Type Category 08/07/2013 VT   Final  . Zone Setting Type Category 08/07/2013 VENTRICULAR_TACHYCARDIA_1   Final  . Zone Setting Type Category 08/07/2013 VENTRICULAR_TACHYCARDIA_2   Final  . Zone Detect Interval 08/07/2013 400  Final  . Zone Setting Type Category 08/07/2013 ATRIAL_FIBRILLATION   Final  . Zone Setting Type Category 08/07/2013 ATAF   Final  . Zone Detect Interval 08/07/2013 350   Final  . RA Impedance 08/07/2013 464   Final  . RV IMPEDANCE 08/07/2013 456   Final  . RV Amplitude 08/07/2013 8.464   Final  . Battery Status 08/07/2013 OK   Final  . Battery Voltage 08/07/2013 2.99   Final  . Loletha Grayer RA Perc Paced 08/07/2013 0.95   Final  . Loletha Grayer RV Perc Paced 08/07/2013 45.41   Final  . Loletha Grayer AP VP Percent 08/07/2013 0.63   Final  . Loletha Grayer AS VP Percent 08/07/2013 44.78   Final  . Loletha Grayer AP VS Percent 08/07/2013 0.31   Final  . Loletha Grayer AS VS Percent 08/07/2013 54.28   Final  . Eval Rhythm 08/07/2013 AF w/VS   Final  . Miscellaneous Comment 08/07/2013    Final                   Value:Pacemaker remote check. Device function reviewed. Impedance, sensing, auto capture thresholds consistent with previous measurements. Histograms appropriate for patient and level of activity. All other diagnostic data reviewed and is appropriate and                          stable for patient. Real time/magnet EGM shows appropriate sensing and  capture. Pt in AF 100% of time. + Eliquis. No ventricular high rate episodes. Battery voltage 2.99 V. [facility] 11-04-13 and OV in February with Leonard on 06/06/2013  Component Date Value Ref Range Status  . Glucose 05/28/2013 101   Final  . BUN 05/28/2013 25* 4 - 21 mg/dL Final  . Creatinine 05/28/2013 1.1  0.6 - 1.3 mg/dL Final  . Potassium 05/28/2013 4.0  3.4 - 5.3 mmol/L Final  . Sodium 05/28/2013 140  137 - 147 mmol/L Final  [facility]ursing Home on 05/23/2013  Component Date Value Ref Range Status  . TSH 05/16/2013 3.15  0.41 - 5.90 uIU/mL Final  . Glucose 05/14/2013 119   Final  . BUN 05/14/2013 19  4 - 21 mg/dL Final  . Creatinine 05/14/2013 1.1  0.6 - 1.3 mg/dL Final  . Potassium 05/14/2013 4.5  3.4 - 5.3 mmol/L Final  . Sodium 05/14/2013 139  137 - 147 mmol/L Final  . Alkaline Phosphatase 05/14/2013 86  25 - 125 U/L Final  . ALT 05/14/2013 16  10 - 40 U/L Final  . AST 05/14/2013 18  14 - 40 U/L Final  . Bilirubin, Total 05/14/2013 1.4   Final  . TSH 05/14/2013 3.68  0.41 - 5.90 uIU/mL Final      Assessment/Plan  1. Atrial fibrillation Controlled rate  2. CVA - Rt brain stroke 12/14 Recovering. Still some speech deficits  3. HTN (hypertension) Mild elevation in SBP  4. Edema Increase furosemide to 80 mg qam  5. Pruritic condition imptoved  6. Rhinorrhea Try Atrovent NS  7. Conjunctivitis Try Patanol

## 2013-08-23 ENCOUNTER — Encounter: Payer: Self-pay | Admitting: Cardiovascular Disease

## 2013-08-26 ENCOUNTER — Encounter: Payer: Self-pay | Admitting: Cardiovascular Disease

## 2013-08-29 DIAGNOSIS — J3489 Other specified disorders of nose and nasal sinuses: Secondary | ICD-10-CM | POA: Diagnosis not present

## 2013-08-29 DIAGNOSIS — H109 Unspecified conjunctivitis: Secondary | ICD-10-CM | POA: Diagnosis not present

## 2013-08-29 DIAGNOSIS — I10 Essential (primary) hypertension: Secondary | ICD-10-CM | POA: Diagnosis not present

## 2013-08-29 DIAGNOSIS — R609 Edema, unspecified: Secondary | ICD-10-CM | POA: Diagnosis not present

## 2013-08-29 DIAGNOSIS — I110 Hypertensive heart disease with heart failure: Secondary | ICD-10-CM | POA: Diagnosis not present

## 2013-08-29 DIAGNOSIS — I6350 Cerebral infarction due to unspecified occlusion or stenosis of unspecified cerebral artery: Secondary | ICD-10-CM | POA: Diagnosis not present

## 2013-08-29 DIAGNOSIS — I4891 Unspecified atrial fibrillation: Secondary | ICD-10-CM | POA: Diagnosis not present

## 2013-08-29 DIAGNOSIS — L299 Pruritus, unspecified: Secondary | ICD-10-CM | POA: Diagnosis not present

## 2013-08-29 DIAGNOSIS — E119 Type 2 diabetes mellitus without complications: Secondary | ICD-10-CM | POA: Diagnosis not present

## 2013-08-29 LAB — HEMOGLOBIN A1C: HEMOGLOBIN A1C: 6.5 % — AB (ref 4.0–6.0)

## 2013-08-29 LAB — TSH: TSH: 0.62 u[IU]/mL (ref 0.41–5.90)

## 2013-08-29 LAB — BASIC METABOLIC PANEL
BUN: 23 mg/dL — AB (ref 4–21)
Creatinine: 1.1 mg/dL (ref 0.6–1.3)
GLUCOSE: 104 mg/dL
Potassium: 4.6 mmol/L (ref 3.4–5.3)
Sodium: 137 mmol/L (ref 137–147)

## 2013-09-05 ENCOUNTER — Other Ambulatory Visit: Payer: Self-pay | Admitting: Nurse Practitioner

## 2013-09-05 DIAGNOSIS — E039 Hypothyroidism, unspecified: Secondary | ICD-10-CM

## 2013-09-05 DIAGNOSIS — E119 Type 2 diabetes mellitus without complications: Secondary | ICD-10-CM

## 2013-09-05 DIAGNOSIS — I5023 Acute on chronic systolic (congestive) heart failure: Secondary | ICD-10-CM

## 2013-09-12 ENCOUNTER — Encounter: Payer: Self-pay | Admitting: Nurse Practitioner

## 2013-09-12 ENCOUNTER — Non-Acute Institutional Stay: Payer: Medicare Other | Admitting: Nurse Practitioner

## 2013-09-12 DIAGNOSIS — J3489 Other specified disorders of nose and nasal sinuses: Secondary | ICD-10-CM

## 2013-09-12 DIAGNOSIS — R059 Cough, unspecified: Secondary | ICD-10-CM | POA: Insufficient documentation

## 2013-09-12 DIAGNOSIS — I4891 Unspecified atrial fibrillation: Secondary | ICD-10-CM | POA: Diagnosis not present

## 2013-09-12 DIAGNOSIS — H11149 Conjunctival xerosis, unspecified, unspecified eye: Secondary | ICD-10-CM

## 2013-09-12 DIAGNOSIS — I5023 Acute on chronic systolic (congestive) heart failure: Secondary | ICD-10-CM

## 2013-09-12 DIAGNOSIS — F4323 Adjustment disorder with mixed anxiety and depressed mood: Secondary | ICD-10-CM

## 2013-09-12 DIAGNOSIS — E039 Hypothyroidism, unspecified: Secondary | ICD-10-CM | POA: Diagnosis not present

## 2013-09-12 DIAGNOSIS — I10 Essential (primary) hypertension: Secondary | ICD-10-CM

## 2013-09-12 DIAGNOSIS — I509 Heart failure, unspecified: Secondary | ICD-10-CM

## 2013-09-12 DIAGNOSIS — I480 Paroxysmal atrial fibrillation: Secondary | ICD-10-CM

## 2013-09-12 DIAGNOSIS — R259 Unspecified abnormal involuntary movements: Secondary | ICD-10-CM

## 2013-09-12 DIAGNOSIS — R252 Cramp and spasm: Secondary | ICD-10-CM

## 2013-09-12 DIAGNOSIS — R05 Cough: Secondary | ICD-10-CM

## 2013-09-12 DIAGNOSIS — E507 Other ocular manifestations of vitamin A deficiency: Secondary | ICD-10-CM

## 2013-09-12 DIAGNOSIS — N138 Other obstructive and reflux uropathy: Secondary | ICD-10-CM

## 2013-09-12 DIAGNOSIS — Z7901 Long term (current) use of anticoagulants: Secondary | ICD-10-CM

## 2013-09-12 DIAGNOSIS — N401 Enlarged prostate with lower urinary tract symptoms: Secondary | ICD-10-CM

## 2013-09-12 DIAGNOSIS — E119 Type 2 diabetes mellitus without complications: Secondary | ICD-10-CM

## 2013-09-12 DIAGNOSIS — R609 Edema, unspecified: Secondary | ICD-10-CM

## 2013-09-12 DIAGNOSIS — M109 Gout, unspecified: Secondary | ICD-10-CM

## 2013-09-12 NOTE — Progress Notes (Signed)
Patient ID: Jon [name], male   DOB: 08-Feb-1922, 80 y.o.   MRN: [id number]   Code Status: DNR  Allergies  Allergen Reactions  . Caduet [Amlodipine-Atorvastatin] Other (See Comments)    Weakness   . Crestor [Rosuvastatin] Other (See Comments)    Muscle pain/weakness  . Cymbalta [Duloxetine Hcl] Other (See Comments)    Excessive sedation  . Lipitor [Atorvastatin Calcium] Other (See Comments)    Muscle pain/weakness  . Lisinopril Other (See Comments)    Doesn't remember reaction  . Simvastatin Other (See Comments)    Muscle pain/weakness    Chief Complaint  Patient presents with  . Medical Management of Chronic Issues  . Acute Visit    f/u BLE edema, weight, right foot cramp, irritated eyes, hacking cough.     HPI: Patient is a 80 y.o. male seen in the [facility]  at [facility] today for evaluation of irritated eyes, hacking cough, BLE edema, weight, R foot cramp, and other chronic medical conditions.   Hospitalized from 04/30/2013 to 05/03/2013 for RLE weakness which lasted about 30 minutes. TIA was considered given nature of the problem and CT brain findings/MRI and MRA brain. He was discharged with increased Eliquis and adjusting of antihypertensive medications.   Problem List Items Addressed This Visit   Atrial fibrillation - Primary (Chronic)     Rate controlled, takes Metoprolol 71m bid.       HTN (hypertension) (Chronic)     Metoprolol 75 mg twice a day and Losartan 1060mdaily and Furosemide 8061maily-controlled      Hypothyroidism (Chronic)     takes Levothyroxine 92m66mTSH 0.617 08/29/13      Long term (current) use of anticoagulants (Chronic)     Eliquis 5mg 28m.       Type II or unspecified type diabetes mellitus without mention of complication, not stated as uncontrolled (Chronic)     08/29/13 Hgb A1c 6.5. Continue diet control.      Acute on chronic systolic congestive heart failure, NYHA class 2     Compensated. Continue Furosemide 80mg 84mly. BNP 284.1 08/29/13    Gout, unspecified     No flare ups. Takes Allopurinol 50mg d28m.      Hypertrophy of prostate with urinary obstruction and other lower urinary tract symptoms (LUTS)     Have noticed more incontinent episodes especially after Furosemide was increased. He participates Kegel excise and uses adult pad.      Adjustment disorder with mixed anxiety and depressed mood     Takes Lorazepam nightly and prn Ambien for rest at night.     Edema     Much improved since Furosemide 80mg 6/87m5.. Trace RLE with mild heat. S/p bypass surgery.     Rhinorrhea     Better managed with Atrovent nasal spray. Off Claritin and Flonase.     Xerophthalmus     Irritation in nature-especially the right eye since it doesn't close fully after CVA-the patient prefers OTC dry eye gel and may f/u Ophthalmology.     Cough     Chronic, seems more noticeable since off Claritin 08/15/13. Denied chest pain, SOB, paroxysmal orthopnea, phlegm production, association with swallowing, or awakening from cough. Will continue to observe.     Foot spasms     Mainly in his left foot. Will check Mg and BMP       Review of Systems:  Review of Systems  Constitutional: Negative for fever, chills, weight loss, malaise/fatigue and diaphoresis.  HENT: Positive for hearing loss. Negative for congestion, ear discharge, ear pain, nosebleeds, sore throat and tinnitus.        Left nostril-no recent occurrence. Dry irritated eyes  Eyes: Negative for blurred vision, double vision, photophobia, pain, discharge and redness.       Dry eyes. Especially the right eye since its incomplete closure since the CVA  Respiratory: Positive for cough. Negative for hemoptysis, sputum production, shortness of breath, wheezing and stridor.        Hacking  Cardiovascular: Positive for leg swelling. Negative for chest pain, palpitations, orthopnea, claudication and PND.       Most prominent the right lateral ankle-better since  Furosemide 35m daily  Gastrointestinal: Negative for heartburn, nausea, abdominal pain, diarrhea, constipation and blood in stool.  Genitourinary: Positive for urgency and frequency. Negative for dysuria, hematuria and flank pain.       Cannot make it to commode sometimes. Kegel exercise and adult depends.   Musculoskeletal: Positive for joint pain. Negative for back pain, falls, myalgias and neck pain.       Left knee pain. Left foot spasm.   Skin: Positive for itching. Negative for rash.       Chronic and mild.  Neurological: Positive for speech change and focal weakness. Negative for dizziness, tingling, tremors, sensory change, seizures, loss of consciousness, weakness and headaches.       Mild R facial droop and right sided weakness. Grip strength 5/5-but weaker than the left. Unsteady gait. Aphasia is better.   Endo/Heme/Allergies: Positive for environmental allergies. Negative for polydipsia. Does not bruise/bleed easily.  Psychiatric/Behavioral: Positive for depression and memory loss. Negative for suicidal ideas, hallucinations and substance abuse. The patient is nervous/anxious and has insomnia.        Sleeps better     Past Medical History  Diagnosis Date  . Hypertension   . Coronary artery disease   . Arthritis   . Cataract   . Anxiety   . Blood transfusion without reported diagnosis   . Hyperlipidemia   . Heart murmur   . Allergy   . Thyroid disease   . PAF (paroxysmal atrial fibrillation)     coumadin  . History of abdominal aortic aneurysm   . History of echocardiogram 12/2008    EF >55%; mild concentric LVH; mild MR; mild-mod TR; mild AV regurg;   . History of nuclear stress test 12/2010    lexiscan; low risk; compared to prior study, perfusion improved  . History of Doppler ultrasound 05/22/2012    LEAs; R anterior tibial artery appeared occluded; L posterior tibial shows short segment of occlusive ds  . History of Doppler ultrasound 05/22/2012    Abdominal  Aortic Doppler; slight increase in fusiform aneurysm   . Stroke   . Adjustment disorder with mixed anxiety and depressed mood 04/18/2013    Post CVA depression and anxiety   . Abnormality of gait 04/18/2013  . 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 Increased exposure of the right cornea.   . Pruritic condition 01/24/2013  . Memory change 01/24/2013  . Abnormal PFTs 10/30/2012    Followed in Pulmonary clinic/ [facility]/ [place]  - PFT's 10/22/12 VC  55% no obst, DLCO 50%  - PFT's 12/20/2012 VC 83% and no obst, dLCO 55%  -11/08/2012  Walked RA x 3 laps @ 185 ft each stopped due to  End of study, not desat -11/08/12 esr 10    . Impotence of organic origin 10/03/2000  .  Osteoarthrosis, unspecified whether generalized or localized, unspecified site 02/05/2003  . Hypertrophy of prostate with urinary obstruction and other lower urinary tract symptoms (LUTS) 04/28/2004  . Gout, unspecified 11/03/2004  . Acute on chronic systolic congestive heart failure, NYHA class 2 03/16/2006    BNP 376.9 05/28/13   . Atherosclerosis of renal artery 10/18/2006  . Unspecified vitamin D deficiency 10/18/2006  . Obstructive sleep apnea (adult) (pediatric) 07/23/2008  . Major depressive disorder, single episode, unspecified 03/05/2009  . Basal cell carcinoma of skin of other and unspecified parts of face 12/24/2009  . Internal hemorrhoids without mention of complication 35/57/3220  . Muscle weakness (generalized) 03/10/2011  . Pain in joint, lower leg 08/04/2011  . Nocturia 10/27/2011  . Aortic aneurysm of unspecified site without mention of rupture 10/27/2011  . Fall 03/04/2011  . Hyponatremia 03/04/2011  . TIA (transient ischemic attack) 04/30/2013  . Fatigue 04/18/2013  . 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 CVA - Rt brain stroke 12/14 02/18/2013    02/19/13 angiography CT head:  Diffuse atherosclerotic irregularity and plaque formation of the  distal right common carotid artery and proximal internal carotid artery without significant stenosis. Plaque ulceration is present and is a source of emboli. A small right thalamic CVA    . Dyslipidemia 11/03/2004  . Type II or unspecified type diabetes mellitus without mention of complication, not stated as uncontrolled 11/24/2004  . Spinal stenosis in cervical region 12/15/2004  . Right bundle branch block 04/19/2006  . Cardiac pacemaker in situ- MDT 10/11 03/18/2010    Medtronic revo implant in October 2011. Severe sinus bradycardia in the 30s, but not truly pacemaker dependent   . Long term (current) use of anticoagulants 03/17/2011  . Hypothyroidism 03/04/2011  . HTN (hypertension) 03/04/2011  . Atrial fibrillation 03/04/2011     He was on coumadin until his stroke last December and was switched to apixaban 2.18m daily which he has been taking faithfully without reported side effects.     Past Surgical History  Procedure Laterality Date  . Coronary artery bypass graft  1989  . Pacemaker insertion  2011  . Insert / replace / remove pacemaker    . Eye surgery    . Joint replacement    . Hernia repair    . Coronary angioplasty with stent placement  2008    stent to SVG to OM   Social History:   reports that he quit smoking about 46 years ago. His smoking use included Cigarettes. He has a 11.25 pack-year smoking history. He has never used smokeless tobacco. He reports that he does not drink alcohol or use illicit drugs.  Family History  Problem Relation Age of Onset  . Diabetes Father   . Other Mother     PAD with amputations    Medications: Patient's Medications  New Prescriptions   No medications on file  Previous Medications   ACETAMINOPHEN (TYLENOL) 325 MG TABLET    Take 2 tablets (650 mg total) by mouth 2 (two) times daily. DO NOT EXCEED 4 TABLETS DAILY   ALLOPURINOL (ZYLOPRIM) 100 MG TABLET    Take 50 mg by mouth daily.    AMOXICILLIN (AMOXIL) 500 MG CAPSULE    Take 500 mg  by mouth. Take 4 caps prior to dential   APIXABAN (ELIQUIS) 5 MG TABS TABLET    Take 1 tablet (5 mg total) by mouth 2 (two) times daily.   CHOLECALCIFEROL (VITAMIN  D) 400 UNITS CAPSULE    Take 400 Units by mouth 2 (two) times daily.    IPRATROPIUM (ATROVENT) 0.06 % NASAL SPRAY    One puff in each nostril before each meal   LEVOTHYROXINE (SYNTHROID, LEVOTHROID) 100 MCG TABLET    Take 100 mcg by mouth daily before breakfast.   LOSARTAN (COZAAR) 100 MG TABLET    Take 1 tablet (100 mg total) by mouth daily.   METOPROLOL TARTRATE (LOPRESSOR) 25 MG TABLET    Take 3 tablets (75 mg total) by mouth 2 (two) times daily.   NITROGLYCERIN (NITROSTAT) 0.4 MG SL TABLET    Place 0.4 mg under the tongue every 5 (five) minutes as needed for chest pain.    OLOPATADINE (PATANOL) 0.1 % OPHTHALMIC SOLUTION    One drop in each eye twice daily to help conjunctival irritation   POLYVINYL ALCOHOL (LIQUIFILM TEARS) 1.4 % OPHTHALMIC SOLUTION    Place 1 drop into both eyes 4 (four) times daily as needed for dry eyes.   POTASSIUM CHLORIDE (MICRO-K) 10 MEQ CR CAPSULE    Take 10 mEq by mouth 2 (two) times daily.   SKIN PROTECTANTS, MISC. (EUCERIN) CREAM    Apply 1 application topically daily as needed for dry skin (itching).   ZOLPIDEM (AMBIEN) 10 MG TABLET    Take 2.5-5 mg by mouth at bedtime as needed for sleep.  Modified Medications   No medications on file  Discontinued Medications   No medications on file   Physical Exam: Physical Exam  Nursing note and vitals reviewed. Constitutional: He is oriented to person, place, and time. He appears well-developed and well-nourished. No distress.  W/c for generalized weakness and left knee pain. Ambulates with walker today.   HENT:  Severe hearing loss. Bilateral aides.Right facial droop.   Eyes:  Corrective lenses. Increased exposure of the right cornea due to post CVA facial droop.  Neck: No JVD present. No tracheal deviation present. No thyromegaly present.    Cardiovascular: Normal rate and normal heart sounds.  Frequent extrasystoles are present.  No murmur heard. Pacemaker left upper chest  Pulmonary/Chest: Effort normal. He has no wheezes. He has rales.  dry rales posterior lung bases.   Abdominal: Soft. Bowel sounds are normal. He exhibits no distension and no mass. There is no tenderness.  Musculoskeletal: Normal range of motion. He exhibits edema and tenderness.  Unstable gait. Using 4 wheel walker and w/c. Weaker on the right side. Left knee pain w/o swelling or redness or heat. Hx of the right total knee replacement. Edema noted trace RLE  Lymphadenopathy:    He has no cervical adenopathy.  Neurological: He is alert and oriented to person, place, and time. He displays normal reflexes. No cranial nerve deficit. He exhibits normal muscle tone. Coordination normal.  Slurred speech. Right side weakness.  Skin: Skin is warm and dry. No rash noted. No erythema. No pallor.  Psychiatric: He has a normal mood and affect. His behavior is normal. Judgment and thought content normal.   Filed Vitals:   09/12/13 1116  BP: 126/70  Pulse: 68  Temp: 98 F (36.7 C)  TempSrc: Tympanic  Resp: 18   Labs reviewed: Basic Metabolic Panel:  Recent Labs  02/22/13 0943  03/27/13 1636 04/30/13 1424 04/30/13 1425 05/14/13 05/16/13 05/28/13 08/29/13  NA 130*  < > 134* 141  --  139  --  140 137  K 4.0  < > 4.7 4.2  --  4.5  --  4.0 4.6  CL 97  --  99 104  --   --   --   --   --   CO2 23  --  25 24  --   --   --   --   --   GLUCOSE 131*  --  107* 111*  --   --   --   --   --   BUN 14  < > 25* 28*  --  19  --  25* 23*  CREATININE 0.74  < > 1.18 1.11  --  1.1  --  1.1 1.1  CALCIUM 8.7  --  9.7 9.0  --   --   --   --   --   MG  --   --  2.3  --  2.2  --   --   --   --   TSH  --   --  6.812*  --   --  3.68 3.15  --  0.62  < > = values in this interval not displayed. Liver Function Tests:  Recent Labs  02/18/13 2025 03/27/13 1636 04/30/13 1424  05/14/13  AST _0 ALT _1 ALKPHOS 90 81 81 86  BILITOT 0.7 1.1 0.7  --   PROT 6.7 6.8 6.4  --   ALBUMIN 3.6 4.3 3.3*  --    CBC:  Recent Labs  11/08/12 1611 02/18/13 2025 02/18/13 2032 03/27/13 1636 04/30/13 1424  WBC 7.2 6.9  --  9.0 6.9  NEUTROABS 4.7 3.7  --   --  4.5  HGB 16.0 15.5 16.3 16.5 15.7  HCT 47.4 43.1 48.0 47.2 44.6  MCV 95.4 92.3  --  93.7 92.9  PLT 260.0 217  --  241 207   Lipid Panel:  Recent Labs  02/19/13 0355 03/26/13 05/01/13 0528  CHOL 187 89 117  HDL 34* 31* 30*  LDLCALC 117* 47 71  TRIG 181* 54 79  CHOLHDL 5.5  --  3.9   Past Procedures:   04/30/2013   CLINICAL DATA:  Stroke.  Ex-smoker.  EXAM: CHEST  2 VIEW  .  IMPRESSION: Minimal bibasilar linear atelectasis.     04/30/2013   CLINICAL DATA:  TRANSIENT ISCHEMIC ATTACK  EXAM: CT HEAD WITHOUT CONTRAST  IMPRESSION: Findings consistent with an area of subacute to chronic lacunar infarction left MCA distribution. Involutional and chronic changes also appreciated. There is no evidence of acute abnormalities.     05/01/2013    EXAM: MRI HEAD WITHOUT CONTRAST  MRA HEAD WITHOUT CONTRAST    IMPRESSION: 1. Small acute/subacute infarct left corona radiata. No mass effect or hemorrhage. 2. Three superimposed punctate acute lacunar infarcts (right caudate nucleus, left occipital pole, left left parietal lobe). These could reflect synchronous small vessel ischemia rather than a recent embolic event. 3. Underlying chronic small vessel ischemia. 4. Intracranial atherosclerosis. Fusiform aneurysmal enlargement of the left ICA (cavernous segment) and right ICA terminus. No anterior circulation major branch occlusion. 5. Atherosclerosis in stenosis of the distal right vertebral artery which appears hemodynamically significant. Other posterior circulation atherosclerosis without stenosis.     Mr Jodene Nam Head/brain Wo Cm  05/01/2013  EXAM: MRI HEAD WITHOUT CONTRAST  MRA HEAD WITHOUT CONTRAST   IMPRESSION:  1. Small acute/subacute infarct left corona radiata. No mass effect or hemorrhage. 2. Three superimposed punctate acute lacunar infarcts (right caudate nucleus, left occipital pole, left left parietal lobe). These could reflect synchronous small vessel ischemia rather than  a recent embolic event. 3. Underlying chronic small vessel ischemia. 4. Intracranial atherosclerosis. Fusiform aneurysmal enlargement of the left ICA (cavernous segment) and right ICA terminus. No anterior circulation major branch occlusion. 5. Atherosclerosis in stenosis of the distal right vertebral artery which appears hemodynamically significant. Other posterior circulation atherosclerosis without stenosis.     EKG Atrial fibrillation Right bundle branch block ST depr, consider ischemia, anterolateral lds Baseline wander in lead(s) II III aVR aVL aVF V3   Assessment/Plan Atrial fibrillation Rate controlled, takes Metoprolol 73m bid.     HTN (hypertension) Metoprolol 75 mg twice a day and Losartan 1050mdaily and Furosemide 8041maily-controlled    Hypothyroidism takes Levothyroxine 52m30mTSH 0.617 08/29/13    Long term (current) use of anticoagulants Eliquis 5mg 39m.     Type II or unspecified type diabetes mellitus without mention of complication, not stated as uncontrolled 08/29/13 Hgb A1c 6.5. Continue diet control.    Acute on chronic systolic congestive heart failure, NYHA class 2 Compensated. Continue Furosemide 80mg 77my. BNP 284.1 08/29/13  Hypertrophy of prostate with urinary obstruction and other lower urinary tract symptoms (LUTS) Have noticed more incontinent episodes especially after Furosemide was increased. He participates Kegel excise and uses adult pad.    Adjustment disorder with mixed anxiety and depressed mood Takes Lorazepam nightly and prn Ambien for rest at night.   Edema Much improved since Furosemide 80mg 645m15.. Trace RLE with mild heat. S/p bypass surgery.    Rhinorrhea Better managed with Atrovent nasal spray. Off Claritin and Flonase.   Xerophthalmus Irritation in nature-especially the right eye since it doesn't close fully after CVA-the patient prefers OTC dry eye gel and may f/u Ophthalmology.   Cough Chronic, seems more noticeable since off Claritin 08/15/13. Denied chest pain, SOB, paroxysmal orthopnea, phlegm production, association with swallowing, or awakening from cough. Will continue to observe.   Gout, unspecified No flare ups. Takes Allopurinol 50mg da34m    Foot spasms Mainly in his left foot. Will check Mg and BMP    Family/ Staff Communication: observe the patient.   Goals of Care: AL  Labs/tests ordered: Magnesium level and BMP

## 2013-09-12 NOTE — Assessment & Plan Note (Signed)
08/29/13 Hgb A1c 6.5. Continue diet control.

## 2013-09-12 NOTE — Assessment & Plan Note (Signed)
Eliquis 5 mg bid

## 2013-09-12 NOTE — Assessment & Plan Note (Signed)
Irritation in nature-especially the right eye since it doesn't close fully after CVA-the patient prefers OTC dry eye gel and may f/u Ophthalmology.

## 2013-09-12 NOTE — Assessment & Plan Note (Signed)
Rate controlled, takes Metoprolol 75mg  bid.

## 2013-09-12 NOTE — Assessment & Plan Note (Signed)
Takes Lorazepam nightly and prn Ambien for rest at night.

## 2013-09-12 NOTE — Assessment & Plan Note (Signed)
Compensated. Continue Furosemide 80mg  daily. BNP 284.1 08/29/13

## 2013-09-12 NOTE — Assessment & Plan Note (Signed)
takes Levothyroxine 59mcg, TSH 0.617 08/29/13

## 2013-09-12 NOTE — Assessment & Plan Note (Signed)
Have noticed more incontinent episodes especially after Furosemide was increased. He participates Kegel excise and uses adult pad.

## 2013-09-12 NOTE — Assessment & Plan Note (Signed)
Metoprolol 75 mg twice a day and Losartan 100mg daily and Furosemide 80mg daily-controlled  

## 2013-09-12 NOTE — Assessment & Plan Note (Signed)
Mainly in his left foot. Will check Mg and BMP

## 2013-09-12 NOTE — Assessment & Plan Note (Addendum)
Chronic, seems more noticeable since off Claritin 08/15/13. Denied chest pain, SOB, paroxysmal orthopnea, phlegm production, association with swallowing, or awakening from cough. Will continue to observe.

## 2013-09-12 NOTE — Assessment & Plan Note (Addendum)
Much improved since Furosemide 80mg  08/15/13.. Trace RLE with mild heat. S/p bypass surgery.

## 2013-09-12 NOTE — Assessment & Plan Note (Signed)
Better managed with Atrovent nasal spray. Off Claritin and Flonase.

## 2013-09-12 NOTE — Assessment & Plan Note (Signed)
No flare ups. Takes Allopurinol 50mg daily.   

## 2013-09-17 DIAGNOSIS — I10 Essential (primary) hypertension: Secondary | ICD-10-CM | POA: Diagnosis not present

## 2013-09-17 LAB — BASIC METABOLIC PANEL
BUN: 20 mg/dL (ref 4–21)
Creatinine: 1 mg/dL (ref 0.6–1.3)
GLUCOSE: 110 mg/dL
Potassium: 4.4 mmol/L (ref 3.4–5.3)
Sodium: 138 mmol/L (ref 137–147)

## 2013-09-19 ENCOUNTER — Encounter: Payer: Self-pay | Admitting: Nurse Practitioner

## 2013-09-19 ENCOUNTER — Non-Acute Institutional Stay: Payer: Medicare Other | Admitting: Nurse Practitioner

## 2013-09-19 ENCOUNTER — Ambulatory Visit: Payer: Self-pay | Admitting: Neurology

## 2013-09-19 VITALS — BP 142/82 | HR 64 | Wt 159.0 lb

## 2013-09-19 DIAGNOSIS — R55 Syncope and collapse: Secondary | ICD-10-CM | POA: Diagnosis not present

## 2013-09-19 DIAGNOSIS — H11149 Conjunctival xerosis, unspecified, unspecified eye: Secondary | ICD-10-CM

## 2013-09-19 DIAGNOSIS — R252 Cramp and spasm: Secondary | ICD-10-CM

## 2013-09-19 DIAGNOSIS — I2510 Atherosclerotic heart disease of native coronary artery without angina pectoris: Secondary | ICD-10-CM | POA: Diagnosis not present

## 2013-09-19 DIAGNOSIS — I6350 Cerebral infarction due to unspecified occlusion or stenosis of unspecified cerebral artery: Secondary | ICD-10-CM

## 2013-09-19 DIAGNOSIS — IMO0002 Reserved for concepts with insufficient information to code with codable children: Secondary | ICD-10-CM

## 2013-09-19 DIAGNOSIS — J3489 Other specified disorders of nose and nasal sinuses: Secondary | ICD-10-CM

## 2013-09-19 DIAGNOSIS — I480 Paroxysmal atrial fibrillation: Secondary | ICD-10-CM

## 2013-09-19 DIAGNOSIS — F3289 Other specified depressive episodes: Secondary | ICD-10-CM

## 2013-09-19 DIAGNOSIS — E039 Hypothyroidism, unspecified: Secondary | ICD-10-CM | POA: Diagnosis not present

## 2013-09-19 DIAGNOSIS — R259 Unspecified abnormal involuntary movements: Secondary | ICD-10-CM | POA: Diagnosis not present

## 2013-09-19 DIAGNOSIS — R609 Edema, unspecified: Secondary | ICD-10-CM | POA: Diagnosis not present

## 2013-09-19 DIAGNOSIS — E507 Other ocular manifestations of vitamin A deficiency: Secondary | ICD-10-CM

## 2013-09-19 DIAGNOSIS — F329 Major depressive disorder, single episode, unspecified: Secondary | ICD-10-CM

## 2013-09-19 DIAGNOSIS — I10 Essential (primary) hypertension: Secondary | ICD-10-CM

## 2013-09-19 DIAGNOSIS — M109 Gout, unspecified: Secondary | ICD-10-CM

## 2013-09-19 DIAGNOSIS — I4891 Unspecified atrial fibrillation: Secondary | ICD-10-CM

## 2013-09-19 NOTE — Assessment & Plan Note (Signed)
Much improved since Furosemide 80mg 08/15/13.. Trace RLE with mild heat-better today. S/p bypass surgery.    

## 2013-09-19 NOTE — Assessment & Plan Note (Signed)
takes Levothyroxine 58mcg, TSH 0.617 08/29/13

## 2013-09-19 NOTE — Progress Notes (Signed)
This encounter was created in error - please disregard.

## 2013-09-19 NOTE — Assessment & Plan Note (Signed)
Metoprolol 75 mg twice a day and Losartan 100mg daily and Furosemide 80mg daily-controlled  

## 2013-09-19 NOTE — Assessment & Plan Note (Signed)
Mainly in his left foot. 09/17/13 Mg 2.4 and Ca 9.5. The patient stated it only occurs 2-3x/year and declined muscle relaxant. I did recommend OTC MyoFlex.

## 2013-09-19 NOTE — Assessment & Plan Note (Signed)
C/o dizziness when sitting up from lying, resolved w/o intervention, denied chest pain, palpitation, SOB, headaches, vision changes, or focal weakness associated with the event. Will monitor Ortho Bp-lying and standing

## 2013-09-19 NOTE — Assessment & Plan Note (Signed)
Takes Lorazepam nightly and prn Ambien for rest at night.

## 2013-09-19 NOTE — Assessment & Plan Note (Signed)
Rate controlled, takes Metoprolol 75mg  bid.

## 2013-09-19 NOTE — Assessment & Plan Note (Signed)
Artificial tears

## 2013-09-19 NOTE — Assessment & Plan Note (Signed)
No flare ups. Takes Allopurinol 50mg daily.   

## 2013-09-19 NOTE — Assessment & Plan Note (Signed)
Nasal spray?

## 2013-09-19 NOTE — Progress Notes (Signed)
Patient ID: Jon [name], male   DOB: 03/01/1922, 80 y.o.   MRN: [id number]   Code Status: DNR  Allergies  Allergen Reactions  . Caduet [Amlodipine-Atorvastatin] Other (See Comments)    Weakness   . Crestor [Rosuvastatin] Other (See Comments)    Muscle pain/weakness  . Cymbalta [Duloxetine Hcl] Other (See Comments)    Excessive sedation  . Lipitor [Atorvastatin Calcium] Other (See Comments)    Muscle pain/weakness  . Lisinopril Other (See Comments)    Doesn't remember reaction  . Simvastatin Other (See Comments)    Muscle pain/weakness    Chief Complaint  Patient presents with  . Dizziness    2 mornings when he set up in bed got dizzy. Didn't happened this morning.  . Magnesium    wants results done 09/17/13    HPI: Patient is a 80 y.o. male seen in the clinic at [facility] today for evaluation of dizziness,  L foot cramp, and other chronic medical conditions.    Hospitalized from 04/30/2013 to 05/03/2013 for RLE weakness which lasted about 30 minutes. TIA was considered given nature of the problem and CT brain findings/MRI and MRA brain. He was discharged with increased Eliquis and adjusting of antihypertensive medications.   Problem List Items Addressed This Visit   Atrial fibrillation (Chronic)     Rate controlled, takes Metoprolol $RemoveBeforeDE'75mg'xPmCcLEFrUnoEca$  bid.       Relevant Medications      furosemide (LASIX) 80 MG tablet   HTN (hypertension) (Chronic)     Metoprolol 75 mg twice a day and Losartan $RemoveBefo'100mg'CzSpZFTkYmq$  daily and Furosemide $RemoveBefore'80mg'OeubZEdHnsXDq$  daily-controlled       Relevant Medications      furosemide (LASIX) 80 MG tablet   Hypothyroidism (Chronic)     takes Levothyroxine 49mcg, TSH 0.617 08/29/13       Syncope and collapse     C/o dizziness when sitting up from lying, resolved w/o intervention, denied chest pain, palpitation, SOB, headaches, vision changes, or focal weakness associated with the event. Will monitor Ortho Bp-lying and standing     Relevant Medications   furosemide (LASIX) 80 MG tablet   Gout, unspecified     No flare ups. Takes Allopurinol $RemoveBeforeDEI'50mg'LCIJRHoPlnAEjKwz$  daily.       Depression due to stroke     Takes Lorazepam nightly and prn Ambien for rest at night.      Relevant Medications      LORazepam (ATIVAN) 0.5 MG tablet   Edema     Much improved since Furosemide $RemoveBeforeDE'80mg'zIgSDkXrMuiDJBY$  08/15/13.. Trace RLE with mild heat-better today. S/p bypass surgery.      Rhinorrhea     Nasal spray    Xerophthalmus     Artificial tears     Foot spasms - Primary     Mainly in his left foot. 09/17/13 Mg 2.4 and Ca 9.5. The patient stated it only occurs 2-3x/year and declined muscle relaxant. I did recommend OTC MyoFlex.         Review of Systems:  Review of Systems  Constitutional: Negative for fever, chills, weight loss, malaise/fatigue and diaphoresis.  HENT: Positive for hearing loss. Negative for congestion, ear discharge, ear pain, nosebleeds, sore throat and tinnitus.        Left nostril-no recent occurrence. Dry irritated eyes  Eyes: Negative for blurred vision, double vision, photophobia, pain, discharge and redness.       Dry eyes. Especially the right eye since its incomplete closure since the CVA  Respiratory: Positive for cough. Negative  for hemoptysis, sputum production, shortness of breath, wheezing and stridor.        Hacking  Cardiovascular: Positive for leg swelling. Negative for chest pain, palpitations, orthopnea, claudication and PND.       Edema is not apparent today since Furosemide $RemoveBeforeDE'80mg'IgCwzUpXyrokdeO$  daily  Gastrointestinal: Negative for heartburn, nausea, abdominal pain, diarrhea, constipation and blood in stool.  Genitourinary: Positive for frequency. Negative for dysuria, urgency, hematuria and flank pain.       Cannot make it to commode sometimes. Kegel exercise and adult depends.   Musculoskeletal: Positive for joint pain. Negative for back pain, falls, myalgias and neck pain.       Left knee pain. Left foot spasm occurs 2-3x/year  Skin: Positive for  itching. Negative for rash.       Chronic and mild.  Neurological: Positive for dizziness, speech change and focal weakness. Negative for tingling, tremors, sensory change, seizures, loss of consciousness, weakness and headaches.       Mild R facial droop and right sided weakness. Grip strength 5/5-but weaker than the left. Unsteady gait. Aphasia is better. Dizziness occurred when changed his position from lying to sitting up x2-resolved w/o intervention.   Endo/Heme/Allergies: Positive for environmental allergies. Negative for polydipsia. Does not bruise/bleed easily.  Psychiatric/Behavioral: Positive for depression and memory loss. Negative for suicidal ideas, hallucinations and substance abuse. The patient is nervous/anxious and has insomnia.        Sleeps better     Past Medical History  Diagnosis Date  . Hypertension   . Coronary artery disease   . Arthritis   . Cataract   . Anxiety   . Blood transfusion without reported diagnosis   . Hyperlipidemia   . Heart murmur   . Allergy   . Thyroid disease   . PAF (paroxysmal atrial fibrillation)     coumadin  . History of abdominal aortic aneurysm   . History of echocardiogram 12/2008    EF >55%; mild concentric LVH; mild MR; mild-mod TR; mild AV regurg;   . History of nuclear stress test 12/2010    lexiscan; low risk; compared to prior study, perfusion improved  . History of Doppler ultrasound 05/22/2012    LEAs; R anterior tibial artery appeared occluded; L posterior tibial shows short segment of occlusive ds  . History of Doppler ultrasound 05/22/2012    Abdominal Aortic Doppler; slight increase in fusiform aneurysm   . Stroke   . Adjustment disorder with mixed anxiety and depressed mood 04/18/2013    Post CVA depression and anxiety   . Abnormality of gait 04/18/2013  . 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 Increased exposure of the right cornea.   . Pruritic condition 01/24/2013  . Memory change 01/24/2013    . Abnormal PFTs 10/30/2012    Followed in Pulmonary clinic/ [facility]/ [place]  - PFT's 10/22/12 VC  55% no obst, DLCO 50%  - PFT's 12/20/2012 VC 83% and no obst, dLCO 55%  -11/08/2012  Walked RA x 3 laps @ 185 ft each stopped due to  End of study, not desat -11/08/12 esr 10    . Impotence of organic origin 10/03/2000  . Osteoarthrosis, unspecified whether generalized or localized, unspecified site 02/05/2003  . Hypertrophy of prostate with urinary obstruction and other lower urinary tract symptoms (LUTS) 04/28/2004  . Gout, unspecified 11/03/2004  . Acute on chronic systolic congestive heart failure, NYHA class 2 03/16/2006    BNP 376.9 05/28/13   . Atherosclerosis  of renal artery 10/18/2006  . Unspecified vitamin D deficiency 10/18/2006  . Obstructive sleep apnea (adult) (pediatric) 07/23/2008  . Major depressive disorder, single episode, unspecified 03/05/2009  . Basal cell carcinoma of skin of other and unspecified parts of face 12/24/2009  . Internal hemorrhoids without mention of complication 28/78/6767  . Muscle weakness (generalized) 03/10/2011  . Pain in joint, lower leg 08/04/2011  . Nocturia 10/27/2011  . Aortic aneurysm of unspecified site without mention of rupture 10/27/2011  . Fall 03/04/2011  . Hyponatremia 03/04/2011  . TIA (transient ischemic attack) 04/30/2013  . Fatigue 04/18/2013  . 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 CVA - Rt brain stroke 12/14 02/18/2013    02/19/13 angiography CT head:  Diffuse atherosclerotic irregularity and plaque formation of the distal right common carotid artery and proximal internal carotid artery without significant stenosis. Plaque ulceration is present and is a source of emboli. A small right thalamic CVA    . Dyslipidemia 11/03/2004  . Type II or unspecified type diabetes mellitus without mention of complication, not stated as uncontrolled 11/24/2004  . Spinal stenosis in cervical region 12/15/2004   . Right bundle branch block 04/19/2006  . Cardiac pacemaker in situ- MDT 10/11 03/18/2010    Medtronic revo implant in October 2011. Severe sinus bradycardia in the 30s, but not truly pacemaker dependent   . Long term (current) use of anticoagulants 03/17/2011  . Hypothyroidism 03/04/2011  . HTN (hypertension) 03/04/2011  . Atrial fibrillation 03/04/2011     He was on coumadin until his stroke last December and was switched to apixaban 2.$RemoveBefor'5mg'LOitqnfPYGpf$  daily which he has been taking faithfully without reported side effects.     Past Surgical History  Procedure Laterality Date  . Coronary artery bypass graft  1989  . Pacemaker insertion  2011  . Insert / replace / remove pacemaker    . Eye surgery    . Joint replacement    . Hernia repair    . Coronary angioplasty with stent placement  2008    stent to SVG to OM   Social History:   reports that he quit smoking about 46 years ago. His smoking use included Cigarettes. He has a 11.25 pack-year smoking history. He has never used smokeless tobacco. He reports that he does not drink alcohol or use illicit drugs.  Family History  Problem Relation Age of Onset  . Diabetes Father   . Other Mother     PAD with amputations    Medications: Patient's Medications  New Prescriptions   No medications on file  Previous Medications   ACETAMINOPHEN (TYLENOL) 325 MG TABLET    Take 2 tablets (650 mg total) by mouth 2 (two) times daily. DO NOT EXCEED 4 TABLETS DAILY   ALLOPURINOL (ZYLOPRIM) 100 MG TABLET    Take 50 mg by mouth daily.    AMOXICILLIN (AMOXIL) 500 MG CAPSULE    Take 500 mg by mouth. Take 4 caps prior to dential   APIXABAN (ELIQUIS) 5 MG TABS TABLET    Take 1 tablet (5 mg total) by mouth 2 (two) times daily.   CHOLECALCIFEROL (VITAMIN D) 400 UNITS CAPSULE    Take 400 Units by mouth 2 (two) times daily.    FLUTICASONE (FLONASE) 50 MCG/ACT NASAL SPRAY    Place into both nostrils daily. Spray into each nostril daily at noon   FUROSEMIDE (LASIX) 80 MG  TABLET    Take 80 mg  by mouth. Take one one daily   IPRATROPIUM (ATROVENT) 0.06 % NASAL SPRAY    One puff in each nostril before each meal   LEVOTHYROXINE (SYNTHROID, LEVOTHROID) 100 MCG TABLET    Take 100 mcg by mouth daily before breakfast.   LORAZEPAM (ATIVAN) 0.5 MG TABLET    Take 0.5 mg by mouth. Take one tablet at bedtime for anxiety   LOSARTAN (COZAAR) 100 MG TABLET    Take 1 tablet (100 mg total) by mouth daily.   METOPROLOL TARTRATE (LOPRESSOR) 25 MG TABLET    Take 3 tablets (75 mg total) by mouth 2 (two) times daily.   NITROGLYCERIN (NITROSTAT) 0.4 MG SL TABLET    Place 0.4 mg under the tongue every 5 (five) minutes as needed for chest pain.    OLOPATADINE (PATANOL) 0.1 % OPHTHALMIC SOLUTION    One drop in each eye twice daily to help conjunctival irritation   POLYVINYL ALCOHOL (LIQUIFILM TEARS) 1.4 % OPHTHALMIC SOLUTION    Place 1 drop into both eyes. One drop in both eyes twice daily as needed for dry eyes   POTASSIUM CHLORIDE (MICRO-K) 10 MEQ CR CAPSULE    Take 10 mEq by mouth 2 (two) times daily.   ZOLPIDEM (AMBIEN) 10 MG TABLET    Take 5 mg by mouth at bedtime as needed for sleep.   Modified Medications   No medications on file  Discontinued Medications   SKIN PROTECTANTS, MISC. (EUCERIN) CREAM    Apply 1 application topically daily as needed for dry skin (itching).   Physical Exam: Physical Exam  Nursing note and vitals reviewed. Constitutional: He is oriented to person, place, and time. He appears well-developed and well-nourished. No distress.  HENT:  Severe hearing loss. Bilateral aides.Right facial droop.   Eyes:  Corrective lenses. Increased exposure of the right cornea due to post CVA facial droop.  Neck: No JVD present. No tracheal deviation present. No thyromegaly present.  Cardiovascular: Normal rate and normal heart sounds.  Frequent extrasystoles are present.  No murmur heard. Pacemaker left upper chest  Pulmonary/Chest: Effort normal. He has no wheezes. He has  rales.  dry rales posterior lung bases.   Abdominal: Soft. Bowel sounds are normal. He exhibits no distension and no mass. There is no tenderness.  Musculoskeletal: Normal range of motion. He exhibits edema. He exhibits no tenderness.  Unstable gait. Using 4 wheel walker and w/c. Weaker on the right side. Hx of the right total knee replacement. Edema is not apparent today.   Lymphadenopathy:    He has no cervical adenopathy.  Neurological: He is alert and oriented to person, place, and time. He displays normal reflexes. No cranial nerve deficit. He exhibits normal muscle tone. Coordination normal.  Slurred speech. Right side weakness. C/o left foot spasm   Skin: Skin is warm and dry. No rash noted. No erythema. No pallor.  Psychiatric: He has a normal mood and affect. His behavior is normal. Judgment and thought content normal.   Filed Vitals:   09/19/13 1057  BP: 142/82  Pulse: 64  Weight: 159 lb (72.122 kg)  SpO2: 98%   Labs reviewed: Basic Metabolic Panel:  Recent Labs  02/22/13 0943  03/27/13 1636 04/30/13 1424 04/30/13 1425 05/14/13 05/16/13 05/28/13 08/29/13 09/17/13  NA 130*  < > 134* 141  --  139  --  140 137 138  K 4.0  < > 4.7 4.2  --  4.5  --  4.0 4.6 4.4  CL 97  --  99 104  --   --   --   --   --   --   CO2 23  --  25 24  --   --   --   --   --   --   GLUCOSE 131*  --  107* 111*  --   --   --   --   --   --   BUN 14  < > 25* 28*  --  19  --  25* 23* 20  CREATININE 0.74  < > 1.18 1.11  --  1.1  --  1.1 1.1 1.0  CALCIUM 8.7  --  9.7 9.0  --   --   --   --   --   --   MG  --   --  2.3  --  2.2  --   --   --   --   --   TSH  --   --  6.812*  --   --  3.68 3.15  --  0.62  --   < > = values in this interval not displayed. Liver Function Tests:  Recent Labs  02/18/13 2025 03/27/13 1636 04/30/13 1424 05/14/13  AST $Re'23 22 21 18  'dHu$ ALT $R'16 19 16 16  'uD$ ALKPHOS 90 81 81 86  BILITOT 0.7 1.1 0.7  --   PROT 6.7 6.8 6.4  --   ALBUMIN 3.6 4.3 3.3*  --    CBC:  Recent  Labs  11/08/12 1611 02/18/13 2025 02/18/13 2032 03/27/13 1636 04/30/13 1424  WBC 7.2 6.9  --  9.0 6.9  NEUTROABS 4.7 3.7  --   --  4.5  HGB 16.0 15.5 16.3 16.5 15.7  HCT 47.4 43.1 48.0 47.2 44.6  MCV 95.4 92.3  --  93.7 92.9  PLT 260.0 217  --  241 207   Lipid Panel:  Recent Labs  02/19/13 0355 03/26/13 05/01/13 0528  CHOL 187 89 117  HDL 34* 31* 30*  LDLCALC 117* 47 71  TRIG 181* 54 79  CHOLHDL 5.5  --  3.9   Past Procedures:   04/30/2013   CLINICAL DATA:  Stroke.  Ex-smoker.  EXAM: CHEST  2 VIEW  .  IMPRESSION: Minimal bibasilar linear atelectasis.     04/30/2013   CLINICAL DATA:  TRANSIENT ISCHEMIC ATTACK  EXAM: CT HEAD WITHOUT CONTRAST  IMPRESSION: Findings consistent with an area of subacute to chronic lacunar infarction left MCA distribution. Involutional and chronic changes also appreciated. There is no evidence of acute abnormalities.     05/01/2013    EXAM: MRI HEAD WITHOUT CONTRAST  MRA HEAD WITHOUT CONTRAST    IMPRESSION: 1. Small acute/subacute infarct left corona radiata. No mass effect or hemorrhage. 2. Three superimposed punctate acute lacunar infarcts (right caudate nucleus, left occipital pole, left left parietal lobe). These could reflect synchronous small vessel ischemia rather than a recent embolic event. 3. Underlying chronic small vessel ischemia. 4. Intracranial atherosclerosis. Fusiform aneurysmal enlargement of the left ICA (cavernous segment) and right ICA terminus. No anterior circulation major branch occlusion. 5. Atherosclerosis in stenosis of the distal right vertebral artery which appears hemodynamically significant. Other posterior circulation atherosclerosis without stenosis.     Mr Jodene Nam Head/brain Wo Cm  05/01/2013  EXAM: MRI HEAD WITHOUT CONTRAST  MRA HEAD WITHOUT CONTRAST   IMPRESSION: 1. Small acute/subacute infarct left corona radiata. No mass effect or hemorrhage. 2. Three superimposed punctate acute lacunar infarcts (right caudate nucleus, left  occipital pole, left left parietal lobe). These could reflect synchronous small vessel ischemia rather than a recent embolic event. 3. Underlying chronic small vessel ischemia. 4. Intracranial atherosclerosis. Fusiform aneurysmal enlargement of the left ICA (cavernous segment) and right ICA terminus. No anterior circulation major branch occlusion. 5. Atherosclerosis in stenosis of the distal right vertebral artery which appears hemodynamically significant. Other posterior circulation atherosclerosis without stenosis.     EKG Atrial fibrillation Right bundle branch block ST depr, consider ischemia, anterolateral lds Baseline wander in lead(s) II III aVR aVL aVF V3   Assessment/Plan Foot spasms Mainly in his left foot. 09/17/13 Mg 2.4 and Ca 9.5. The patient stated it only occurs 2-3x/year and declined muscle relaxant. I did recommend OTC MyoFlex.    Edema Much improved since Furosemide $RemoveBeforeDE'80mg'xOgfXKXHAWNSjKD$  08/15/13.. Trace RLE with mild heat-better today. S/p bypass surgery.    Syncope and collapse C/o dizziness when sitting up from lying, resolved w/o intervention, denied chest pain, palpitation, SOB, headaches, vision changes, or focal weakness associated with the event. Will monitor Ortho Bp-lying and standing   Hypothyroidism takes Levothyroxine 35mcg, TSH 0.617 08/29/13     Gout, unspecified No flare ups. Takes Allopurinol $RemoveBeforeDEI'50mg'GQRziRDPUZqTrtap$  daily.     Atrial fibrillation Rate controlled, takes Metoprolol $RemoveBeforeDE'75mg'OCovqeDicILzYDJ$  bid.     HTN (hypertension) Metoprolol 75 mg twice a day and Losartan $RemoveBefo'100mg'BuCPQbmfVoa$  daily and Furosemide $RemoveBefore'80mg'eFDgQDSvRyaxd$  daily-controlled     Xerophthalmus Artificial tears   Rhinorrhea Nasal spray  Depression due to stroke Takes Lorazepam nightly and prn Ambien for rest at night.      Family/ Staff Communication: observe the patient.   Goals of Care: AL  Labs/tests ordered: none

## 2013-10-01 ENCOUNTER — Ambulatory Visit (INDEPENDENT_AMBULATORY_CARE_PROVIDER_SITE_OTHER): Payer: Medicare Other | Admitting: Neurology

## 2013-10-01 ENCOUNTER — Encounter: Payer: Self-pay | Admitting: Neurology

## 2013-10-01 VITALS — BP 119/65 | HR 68 | Ht 68.0 in | Wt 159.8 lb

## 2013-10-01 DIAGNOSIS — I2510 Atherosclerotic heart disease of native coronary artery without angina pectoris: Secondary | ICD-10-CM

## 2013-10-01 DIAGNOSIS — I6340 Cerebral infarction due to embolism of unspecified cerebral artery: Secondary | ICD-10-CM | POA: Diagnosis not present

## 2013-10-01 DIAGNOSIS — I482 Chronic atrial fibrillation, unspecified: Secondary | ICD-10-CM

## 2013-10-01 DIAGNOSIS — I4891 Unspecified atrial fibrillation: Secondary | ICD-10-CM | POA: Diagnosis not present

## 2013-10-01 NOTE — Progress Notes (Signed)
STROKE NEUROLOGY FOLLOW UP NOTE  NAME: Jon [name] DOB: 07-20-1921  REASON FOR VISIT: stroke follow up HISTORY FROM: pt and daughter and chart  Today we had the pleasure of seeing Jon [name] in follow-up at our Neurology Clinic. Pt was accompanied by daughter.   History Summary Jon [name] is a 80 y.o. right handed white male with a PMH significant for HTN, HLD, CAD s/p CABG, paroxysmal afib s/p pacemaker, s/p right CEA, recurrent ischemic strokes and AAA followed up in clinic for stroke. He had stroke in Dec 2014 with dysarthria and right face droop. Prior to the stroke, pt was on coumadin, and INR was 2.02 at that time. Nonetheless, he was switched to eliquis 2.5mg  bid for stroke prevention.   In 04/2013, pt was again admitted due to acute onset RLE weakness. He said that he lost control of that leg for approximately 30 minutes. Denies weakness of the right arm, HA, vertigo, double vision, difficulty swallowing, confusion, or visual disturbances. He talked to his daughters and was advised to come to the ED. He stated that has been taking faithfully without reported side effects. CT brain upon arrival to the ED showed findings consistent with an area of subacute to chronic lacunar infarction left MCA distribution and a subsequent MRI-DWI demonstrated: " small acute/subacute infarct left corona radiata. No mass effect or hemorrhage. 2. Three superimposed punctate acute lacunar infarcts (right caudate  nucleus, left occipital pole, left left parietal lobe). These could reflect synchronous small vessel ischemia rather than a recent embolic event". MRA brain showed intracranial atherosclerosis and fusiform enlargement of the cavernous left ICA up to 9 mm diameter as well as fusiform enlargement of the right ICA terminus, up to 6 mm diameter. Total cholesterol 117, triglycerides 79, HDL 30, LDL 71. Eliquis was increased to 5 mg twice daily. Metoprolol was increased from 50 mg twice a  day to 75 mg twice a day for better blood pressure control. Patient had resolution of his symptoms. He was discharged back to ALF.  Interval History During the interval time, the patient has been doing well. He stated that he did not have another episode of weakness, numbness, however, he still has some dysarthria and slurry speech. He continued to take eliquis 5mg  bid and no missing doses. He also complained that he is on lasix and had urinary and bowel urgency. If he did not go to bathroom quick, he will have accident.   REVIEW OF SYSTEMS: Full 14 system review of systems performed and notable only for those listed below and in HPI above, all others are negative:  Constitutional: [name]  Cardiovascular: [name]  Ear/Nose/Throat: hearing loss, running nose, drooling  Skin: itching  Eyes: eye itching, light sensitivity  Respiratory: [name]  Gastroitestinal: urinary urgency and frequent  Hematology/Lymphatic: bruising easily  Endocrine: [name]  Musculoskeletal: [name]  Allergy/Immunology: [name]  Neurological: slurry speech  Psychiatric: agitation  The following represents the patient's updated allergies and side effects list: Allergies  Allergen Reactions  . Caduet [Amlodipine-Atorvastatin] Other (See Comments)    Weakness   . Crestor [Rosuvastatin] Other (See Comments)    Muscle pain/weakness  . Cymbalta [Duloxetine Hcl] Other (See Comments)    Excessive sedation  . Lipitor [Atorvastatin Calcium] Other (See Comments)    Muscle pain/weakness  . Lisinopril Other (See Comments)    Doesn't remember reaction  . Simvastatin Other (See Comments)    Muscle pain/weakness    Labs since last visit of relevance include the following:  Results for orders placed in visit on 66/44/03  BASIC METABOLIC PANEL      Result Value Ref Range   Glucose 110     BUN 20  4 - 21 mg/dL   Creatinine 1.0  0.6 - 1.3 mg/dL   Potassium 4.4  3.4 - 5.3 mmol/L   Sodium 138  137 - 147 mmol/L    The neurologically relevant  items on the patient's problem list were reviewed on today's visit.  Neurologic Examination  A problem focused neurological exam (12 or more points of the single system neurologic examination, vital signs counts as 1 point, cranial nerves count for 8 points) was performed.  Blood pressure 119/65, pulse 68, height 5\' 8"  (1.727 m), weight 159 lb 12.8 oz (72.485 kg).  General - Well nourished, well developed, in no apparent distress, in wheelchair.  Ophthalmologic - not able to see throught.  Cardiovascular - irregularly irregular heart rate and rhythm.  Mental Status -  Level of arousal and orientation to time, place, and person were intact. Language including expression, naming, repetition, comprehension, reading was assessed and found intact. Mild dysarthria  Cranial Nerves II - XII - II - Vision intact OU. III, IV, VI - Extraocular movements intact. V - Facial sensation intact bilaterally. VII - Facial movement intact bilaterally. VIII - Hearing & vestibular intact bilaterally. X - Palate elevates symmetrically, mild dysarthria. XI - Chin turning & shoulder shrug intact bilaterally. XII - Tongue protrusion intact.  Motor Strength - The patient's strength was 4+/5 in all extremities and pronator drift was absent.  Bulk was normal and fasciculations were absent.   Motor Tone - Muscle tone was assessed at the neck and appendages and was normal.  Reflexes - The patient's reflexes were normal in all extremities and he had no pathological reflexes. No rigidity.  Sensory - Light touch, temperature/pinprick were assessed and were normal.    Coordination - The patient had normal movements in the hands with no ataxia or dysmetria.  Tremor was absent.  Gait and Station - walk with walker, slow but no hemiparetic gait.  Data reviewed: I personally reviewed the images and agree with the radiology interpretations.  MRI and MRA 05/01/13 1. Small acute/subacute infarct left corona radiata.  No mass effect or hemorrhage. 2. Three superimposed punctate acute lacunar infarcts (right caudate nucleus, left occipital pole, left left parietal lobe). These could reflect synchronous small vessel ischemia rather than a recent embolic event. 3. Underlying chronic small vessel ischemia. 4. Intracranial atherosclerosis. Fusiform aneurysmal enlargement of the left ICA (cavernous segment) and right ICA terminus. No anterior circulation major branch occlusion. 5. Atherosclerosis in stenosis of the distal right vertebral artery which appears hemodynamically significant. Other posterior circulation atherosclerosis without stenosis. CT 04/2013 Findings consistent with an area of subacute to chronic lacunar infarction left MCA distribution. Involutional and chronic changes also appreciated. There is no evidence of acute abnormalities. Diffuse atherosclerotic disease  CTA head 02/2013 Moderate stenosis of the right cavernous carotid with heavily calcified plaque. There is fusiform dilatation of the right supra clinoid internal carotid artery compatible with atherosclerotic disease. Mild to moderate atherosclerotic stenosis of the left cavernous carotid.  Calcified plaque with mild to moderate stenosis distal vertebral artery bilaterally Diffuse atherosclerotic irregularity and plaque formation of the distal right common carotid artery and proximal internal carotid artery without significant stenosis. Plaque ulceration is present and is a source of emboli. Atherosclerotic disease in the left carotid bifurcation without hemodynamically significant stenosis. 2D ehco 02/2013 -  Left ventricle: The cavity size was normal. Wall thickness   was increased in a pattern of moderate LVH. There was   focal basal hypertrophy. Systolic function was mildly to   moderately reduced. The estimated ejection fraction was in   the range of 40% to 45%. - Aortic valve: Mild regurgitation. Valve area: 2.29cm^2   (Vmax). - Left  atrium: The atrium was moderately dilated. - Right ventricle: The cavity size was mildly dilated. - Right atrium: The atrium was mildly dilated. CUS 02/2013 - Findings consistent with1- 39 percent stenosis involving the right internal carotid artery and the left internal carotid artery. - The right vertebral demonstrates atypical waveforms.   Possible proximal obstruction.   The left vertebral flow is antegrade. - ICA/CCA Ratio. right = 0.33. left= 0.84  Component     Latest Ref Rng 05/01/2013 05/14/2013 05/16/2013 05/28/2013  Cholesterol     0 - 200 mg/dL 117     Triglycerides     <150 mg/dL 79     HDL     >39 mg/dL 30 (L)     Total CHOL/HDL Ratio      3.9     VLDL     0 - 40 mg/dL 16     LDL (calc)     0 - 99 mg/dL 71     Glucose         101  BUN     4 - 21 mg/dL    25 (A)  Creatinine     0.6 - 1.3 mg/dL    1.1  Potassium     3.4 - 5.3 mmol/L    4.0  Sodium     137 - 147 mmol/L    140  Hemoglobin A1C     4.0 - 6.0 % 6.3 (H)     Mean Plasma Glucose     <117 mg/dL 134 (H)     TSH     0.41 - 5.90 uIU/mL  3.68 3.15    Component     Latest Ref Rng 08/29/2013 09/17/2013  Cholesterol     0 - 200 mg/dL    Triglycerides     <150 mg/dL    HDL     >39 mg/dL    Total CHOL/HDL Ratio         VLDL     0 - 40 mg/dL    LDL (calc)     0 - 99 mg/dL    Glucose       110  BUN     4 - 21 mg/dL  20  Creatinine     0.6 - 1.3 mg/dL  1.0  Potassium     3.4 - 5.3 mmol/L  4.4  Sodium     137 - 147 mmol/L  138  Hemoglobin A1C     4.0 - 6.0 % 6.5 (A)   Mean Plasma Glucose     <117 mg/dL    TSH     0.41 - 5.90 uIU/mL 0.62     Assessment: As you may recall, he is a 80 y.o. Caucasian male with PMH significant for HTN, HLD, CAD s/p CABG, paroxysmal afib s/p pacemaker, s/p right CEA, recurrent ischemic strokes and AAA followed up in clinic for stroke. He had first stroke in 02/2013, changed from coumadin to eliquis but on low dose. Had residue of slurry speech. In 04/2013 he had  another stroke complaining of RLE weakness for 70min and resolved. MRI showed multiple punctate or small  infarcts bilatearlly. He was put on eliquis 5mg  bid. Since then, he has not have any stroke like symptoms. He complains of urinary urgency.  Plan:  - continue coumadin 5mg  twice a day. - would like small dose of statin for stroke prevention but he has had side effects of intolerance of lipitor, zocor and crestor), so please try pravastain once pt agrees to start. - continue other home meds. - follow up with PCP for risk factor control.  - RTC in 3 months.  Diagnoses from this visit: No diagnosis found.  No orders of the defined types were placed in this encounter.   No orders of the defined types were placed in this encounter.   There are no Patient Instructions on file for this visit.  Rosalin Hawking, MD PhD [facility] [address] [place], [place] [address] [phone]

## 2013-10-03 ENCOUNTER — Encounter: Payer: Self-pay | Admitting: Nurse Practitioner

## 2013-10-10 ENCOUNTER — Non-Acute Institutional Stay: Payer: Medicare Other | Admitting: Internal Medicine

## 2013-10-10 ENCOUNTER — Encounter: Payer: Self-pay | Admitting: Internal Medicine

## 2013-10-10 VITALS — BP 144/68 | HR 68 | Wt 160.0 lb

## 2013-10-10 DIAGNOSIS — H109 Unspecified conjunctivitis: Secondary | ICD-10-CM

## 2013-10-10 DIAGNOSIS — I63039 Cerebral infarction due to thrombosis of unspecified carotid artery: Secondary | ICD-10-CM

## 2013-10-10 DIAGNOSIS — Z7901 Long term (current) use of anticoagulants: Secondary | ICD-10-CM | POA: Diagnosis not present

## 2013-10-10 DIAGNOSIS — I4891 Unspecified atrial fibrillation: Secondary | ICD-10-CM | POA: Diagnosis not present

## 2013-10-10 DIAGNOSIS — E039 Hypothyroidism, unspecified: Secondary | ICD-10-CM

## 2013-10-10 DIAGNOSIS — F329 Major depressive disorder, single episode, unspecified: Secondary | ICD-10-CM

## 2013-10-10 DIAGNOSIS — I69328 Other speech and language deficits following cerebral infarction: Secondary | ICD-10-CM

## 2013-10-10 DIAGNOSIS — I10 Essential (primary) hypertension: Secondary | ICD-10-CM

## 2013-10-10 DIAGNOSIS — I69928 Other speech and language deficits following unspecified cerebrovascular disease: Secondary | ICD-10-CM

## 2013-10-10 DIAGNOSIS — I2510 Atherosclerotic heart disease of native coronary artery without angina pectoris: Secondary | ICD-10-CM | POA: Diagnosis not present

## 2013-10-10 DIAGNOSIS — I63239 Cerebral infarction due to unspecified occlusion or stenosis of unspecified carotid arteries: Secondary | ICD-10-CM

## 2013-10-10 DIAGNOSIS — R1310 Dysphagia, unspecified: Secondary | ICD-10-CM

## 2013-10-10 DIAGNOSIS — L299 Pruritus, unspecified: Secondary | ICD-10-CM

## 2013-10-10 DIAGNOSIS — F3289 Other specified depressive episodes: Secondary | ICD-10-CM

## 2013-10-10 DIAGNOSIS — IMO0002 Reserved for concepts with insufficient information to code with codable children: Secondary | ICD-10-CM

## 2013-10-10 DIAGNOSIS — E119 Type 2 diabetes mellitus without complications: Secondary | ICD-10-CM

## 2013-10-10 DIAGNOSIS — J3489 Other specified disorders of nose and nasal sinuses: Secondary | ICD-10-CM

## 2013-10-10 DIAGNOSIS — I482 Chronic atrial fibrillation, unspecified: Secondary | ICD-10-CM

## 2013-10-10 DIAGNOSIS — I6350 Cerebral infarction due to unspecified occlusion or stenosis of unspecified cerebral artery: Secondary | ICD-10-CM

## 2013-10-10 NOTE — Progress Notes (Signed)
Patient ID: Jon [name], male   DOB: 1921/07/15, 80 y.o.   MRN: [id number]    Location:  [facility]   Place of Service: Clinic (12)    Allergies  Allergen Reactions  . Caduet [Amlodipine-Atorvastatin] Other (See Comments)    Weakness   . Crestor [Rosuvastatin] Other (See Comments)    Muscle pain/weakness  . Cymbalta [Duloxetine Hcl] Other (See Comments)    Excessive sedation  . Lipitor [Atorvastatin Calcium] Other (See Comments)    Muscle pain/weakness  . Lisinopril Other (See Comments)    Doesn't remember reaction  . Simvastatin Other (See Comments)    Muscle pain/weakness    Chief Complaint  Patient presents with  . Medical Management of Chronic Issues    blood pressure, blood sugar, CHF, anxiety, depression, edema. Here with daughter Izora Gala    HPI:  Chronic atrial fibrillation:ratedcontrolled  Essential hypertension: controlled  Hypothyroidism, unspecified hypothyroidism type: compensated  Long term (current) use of anticoagulants: therapeutic. Warfarin for AF.  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 Residual left hemiparesis. Speech deficits. Mild dysphagia. He saw neurologist, Dr. Erlinda Hong, who encouraged him to use statin, but this patient is statin intolerant. Has has trials of several statins. All led to symptoms of weakness and muscular discomfort.  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 Coughs and chokes at meals sometimes.  Rhinorrhea: not helped by Atrovent nasal. Did not use long because he had episode of epistaxis from left nostril.  Bilateral conjunctivitis: not helped much by Patanol. He switched to Genteal.  Depression due to stroke: unchanged  Pruritic condition: improved    Medications: Patient's  Medications  New Prescriptions   No medications on file  Previous Medications   ACETAMINOPHEN (TYLENOL) 325 MG TABLET    Take 2 tablets (650 mg total) by mouth 2 (two) times daily. DO NOT EXCEED 4 TABLETS DAILY   ALLOPURINOL (ZYLOPRIM) 100 MG TABLET    Take 50 mg by mouth daily.    AMOXICILLIN (AMOXIL) 500 MG CAPSULE    Take 500 mg by mouth. Take 4 caps prior to dential   APIXABAN (ELIQUIS) 5 MG TABS TABLET    Take 1 tablet (5 mg total) by mouth 2 (two) times daily.   CHOLECALCIFEROL (VITAMIN D) 400 UNITS CAPSULE    Take 400 Units by mouth 2 (two) times daily.    FLUTICASONE (FLONASE) 50 MCG/ACT NASAL SPRAY    Place into both nostrils daily. Spray into each nostril daily at noon   FUROSEMIDE (LASIX) 80 MG TABLET    Take 80 mg by mouth. Take one one daily   IPRATROPIUM (ATROVENT) 0.06 % NASAL SPRAY    One puff in each nostril before each meal   LEVOTHYROXINE (SYNTHROID, LEVOTHROID) 100 MCG TABLET    Take 100 mcg by mouth daily before breakfast.   LORAZEPAM (ATIVAN) 0.5 MG TABLET    Take 0.5 mg by mouth. Take one tablet at bedtime for anxiety   LOSARTAN (COZAAR) 100 MG TABLET    Take 1 tablet (100 mg total) by mouth daily.   METOPROLOL TARTRATE (LOPRESSOR) 25 MG TABLET    Take 3 tablets (75 mg total) by mouth 2 (two) times daily.   NITROGLYCERIN (NITROSTAT) 0.4 MG SL TABLET    Place 0.4 mg under the tongue every 5 (five) minutes as needed for  chest pain.    OLOPATADINE (PATANOL) 0.1 % OPHTHALMIC SOLUTION    One drop in each eye twice daily to help conjunctival irritation   POLYVINYL ALCOHOL (LIQUIFILM TEARS) 1.4 % OPHTHALMIC SOLUTION    Place 1 drop into both eyes. One drop in both eyes twice daily as needed for dry eyes   POTASSIUM CHLORIDE (MICRO-K) 10 MEQ CR CAPSULE    Take 10 mEq by mouth 2 (two) times daily.   ZOLPIDEM (AMBIEN) 10 MG TABLET    Take 5 mg by mouth at bedtime as needed for sleep.   Modified Medications   No medications on file  Discontinued Medications   No medications on  file     Review of Systems  Constitutional: Negative.   HENT: Positive for hearing loss. Negative for congestion, ear discharge, ear pain, rhinorrhea, sinus pressure and tinnitus.   Eyes: Negative.   Respiratory: Negative for cough, choking, chest tightness, shortness of breath and wheezing.   Cardiovascular: Negative for palpitations and leg swelling (sometimes, not apparent today. ).  Gastrointestinal: Negative for abdominal pain, diarrhea and abdominal distention.       Chokes on swallowing since his CVA.  Endocrine:       Hypothyroid  Genitourinary: Positive for frequency.  Musculoskeletal: Positive for arthralgias and gait problem. Negative for neck pain and neck stiffness.  Skin: Negative.  Negative for pallor and rash.  Allergic/Immunologic: Negative.   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 Recurrent CVA on 04/30/13 of the left MCA area  Hematological: Negative.   Psychiatric/Behavioral: Negative.     Filed Vitals:   10/10/13 1626  BP: 144/68  Pulse: 68  Weight: 160 lb (72.576 kg)  SpO2: 94%   Body mass index is 24.33 kg/(m^2).  Physical Exam  Nursing note and vitals reviewed. Constitutional: He is oriented to person, place, and time. He appears well-developed and well-nourished. No distress.  HENT:  Severe hearing loss. Bilateral aides.Right facial droop.   Eyes:  Corrective lenses. Increased exposure of the right cornea due to post CVA facial droop.  Neck: No JVD present. No tracheal deviation present. No thyromegaly present.  Cardiovascular: Normal rate and normal heart sounds.  Frequent extrasystoles are present.  No murmur heard. Pacemaker left upper chest  Pulmonary/Chest: Effort normal. He has no wheezes. He has rales.  dry rales posterior lung bases.   Abdominal: Soft. Bowel sounds are normal. He exhibits no distension and no mass. There is no tenderness.  Musculoskeletal: Normal range  of motion. He exhibits edema. He exhibits no tenderness.  Unstable gait. Using 4 wheel walker and w/c. Weaker on the right side. Hx of the right total knee replacement. Edema is not apparent today.   Lymphadenopathy:    He has no cervical adenopathy.  Neurological: He is alert and oriented to person, place, and time. He displays normal reflexes. No cranial nerve deficit. He exhibits normal muscle tone. Coordination normal.  Slurred speech. Right side weakness. C/o left foot spasm   Skin: Skin is warm and dry. No rash noted. No erythema. No pallor.  Psychiatric: He has a normal mood and affect. His behavior is normal. Judgment and thought content normal.     Labs reviewed: Nursing Home on 09/19/2013  Component Date Value Ref Range Status  . Glucose 09/17/2013 110   Final  . BUN 09/17/2013 20  4 - 21 mg/dL Final  . Creatinine 09/17/2013 1.0  0.6 - 1.3 mg/dL  Final  . Potassium 09/17/2013 4.4  3.4 - 5.3 mmol/L Final  . Sodium 09/17/2013 138  137 - 147 mmol/L Final  Lab on 09/05/2013  Component Date Value Ref Range Status  . Glucose 08/29/2013 104   Final  . BUN 08/29/2013 23* 4 - 21 mg/dL Final  . Creatinine 08/29/2013 1.1  0.6 - 1.3 mg/dL Final  . Potassium 08/29/2013 4.6  3.4 - 5.3 mmol/L Final  . Sodium 08/29/2013 137  137 - 147 mmol/L Final  . Hemoglobin A1C 08/29/2013 6.5* 4.0 - 6.0 % Final  . TSH 08/29/2013 0.62  0.41 - 5.90 uIU/mL Final  Clinical Support on 07/26/2013  Component Date Value Ref Range Status  . Date Time Interrogation Session 08/07/2013 [id number]   Final  . Pulse Generator Manufacturer 08/07/2013 Med[employer]onic   Final  . Pulse Gen Model 08/07/2013 RVDR01 Revo MRI   Final  . Pulse Gen Serial Number 08/07/2013 [id number] H   Final  . RV Sense Sensitivity 08/07/2013 0.9   Final  . RA Pace Amplitude 08/07/2013 2   Final  . RV Pace PulseWidth 08/07/2013 0.4   Final  . RV Pace Amplitude 08/07/2013 2.5   Final  . Zone Setting Type Category 08/07/2013 VF   Final  .  Zone Setting Type Category 08/07/2013 VT   Final  . Zone Setting Type Category 08/07/2013 VENTRICULAR_TACHYCARDIA_1   Final  . Zone Setting Type Category 08/07/2013 VENTRICULAR_TACHYCARDIA_2   Final  . Zone Detect Interval 08/07/2013 400   Final  . Zone Setting Type Category 08/07/2013 ATRIAL_FIBRILLATION   Final  . Zone Setting Type Category 08/07/2013 ATAF   Final  . Zone Detect Interval 08/07/2013 350   Final  . RA Impedance 08/07/2013 464   Final  . RV IMPEDANCE 08/07/2013 456   Final  . RV Amplitude 08/07/2013 8.464   Final  . Battery Status 08/07/2013 OK   Final  . Battery Voltage 08/07/2013 2.99   Final  . Loletha Grayer RA Perc Paced 08/07/2013 0.95   Final  . Loletha Grayer RV Perc Paced 08/07/2013 45.41   Final  . Loletha Grayer AP VP Percent 08/07/2013 0.63   Final  . Loletha Grayer AS VP Percent 08/07/2013 44.78   Final  . Loletha Grayer AP VS Percent 08/07/2013 0.31   Final  . Brady AS VS Percent 08/07/2013 54.28   Final  . Eval Rhythm 08/07/2013 AF w/VS   Final  . Miscellaneous Comment 08/07/2013    Final                   Value:Pacemaker remote check. Device function reviewed. Impedance, sensing, auto capture thresholds consistent with previous measurements. Histograms appropriate for patient and level of activity. All other diagnostic data reviewed and is appropriate and                          stable for patient. Real time/magnet EGM shows appropriate sensing and capture. Pt in AF 100% of time. + Eliquis. No ventricular high rate episodes. Battery voltage 2.99 V. Carelink 11-04-13 and OV in February with [facility].      Assessment/Plan  1. Chronic atrial fibrillation Rate controlled  2. Essential hypertension controlled  3. Hypothyroidism, unspecified hypothyroidism type compensated  4. Long term (current) use of anticoagulants Warfarin for chronic AF  5. Type II or unspecified type diabetes mellitus without mention of complication, not stated as uncontrolled controlled  6. Cerebral infarction due to  thrombosis of carotid artery Unchanged.  7. 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 Dysphagia, unspecified(787.20) Persistent since his CVA.  9. Rhinorrhea perennial  10. Bilateral conjunctivitis Chronic. Possibly related to xerophthalmia.  11. Depression due to stroke He prefers not to take antidepressant yet.  12. Pruritic condition improved

## 2013-10-16 DIAGNOSIS — R1310 Dysphagia, unspecified: Secondary | ICD-10-CM | POA: Insufficient documentation

## 2013-10-21 DIAGNOSIS — N390 Urinary tract infection, site not specified: Secondary | ICD-10-CM | POA: Diagnosis not present

## 2013-10-22 ENCOUNTER — Encounter: Payer: Self-pay | Admitting: Internal Medicine

## 2013-10-22 ENCOUNTER — Ambulatory Visit (INDEPENDENT_AMBULATORY_CARE_PROVIDER_SITE_OTHER): Payer: Medicare Other | Admitting: Internal Medicine

## 2013-10-22 VITALS — BP 110/66 | HR 63 | Ht 68.0 in | Wt 161.5 lb

## 2013-10-22 DIAGNOSIS — I10 Essential (primary) hypertension: Secondary | ICD-10-CM

## 2013-10-22 DIAGNOSIS — R32 Unspecified urinary incontinence: Secondary | ICD-10-CM | POA: Diagnosis not present

## 2013-10-22 DIAGNOSIS — I2510 Atherosclerotic heart disease of native coronary artery without angina pectoris: Secondary | ICD-10-CM | POA: Diagnosis not present

## 2013-10-22 DIAGNOSIS — I6350 Cerebral infarction due to unspecified occlusion or stenosis of unspecified cerebral artery: Secondary | ICD-10-CM

## 2013-10-22 DIAGNOSIS — I4891 Unspecified atrial fibrillation: Secondary | ICD-10-CM | POA: Diagnosis not present

## 2013-10-22 DIAGNOSIS — I482 Chronic atrial fibrillation, unspecified: Secondary | ICD-10-CM

## 2013-10-22 DIAGNOSIS — I509 Heart failure, unspecified: Secondary | ICD-10-CM

## 2013-10-22 DIAGNOSIS — I63512 Cerebral infarction due to unspecified occlusion or stenosis of left middle cerebral artery: Secondary | ICD-10-CM

## 2013-10-22 DIAGNOSIS — I5023 Acute on chronic systolic (congestive) heart failure: Secondary | ICD-10-CM

## 2013-10-22 DIAGNOSIS — R040 Epistaxis: Secondary | ICD-10-CM

## 2013-10-22 DIAGNOSIS — I4510 Unspecified right bundle-branch block: Secondary | ICD-10-CM | POA: Diagnosis not present

## 2013-10-22 MED ORDER — APIXABAN 2.5 MG PO TABS: 2.5000 mg | ORAL_TABLET | Freq: Two times a day (BID) | ORAL | Status: DC

## 2013-10-22 MED ORDER — APIXABAN 2.5 MG PO TABS: 2.5000 mg | ORAL_TABLET | Freq: Two times a day (BID) | ORAL | Status: AC

## 2013-10-22 NOTE — Progress Notes (Deleted)
   Patient ID: Jon [name], male    DOB: 03-10-1921, 80 y.o.   MRN: [id number]  HPI    Review of Systems    Physical Exam

## 2013-10-22 NOTE — Patient Instructions (Signed)
Your physician has recommended you make the following change in your medication: DECREASE eliquis to 2.5mg  twice daily  Dr. Debara Pickett has referred you to Dr. Burman Nieves (urologist)  Your physician wants you to follow-up in: 6 months with Dr. Debara Pickett (Feb. 2016) You will receive a reminder letter in the mail two months in advance. If you don't receive a letter, please call our office to schedule the follow-up appointment.

## 2013-10-22 NOTE — Progress Notes (Signed)
OFFICE NOTE  Chief Complaint:  Routine followup  Primary Care Physician: [name], MD  HPI:  Jon [name]  is an 80 yo male formerly followed by Dr. Rex Kras and recently seen by Cecilie Kicks, NP, for left lower extremity edema with discoloration of his toes, was beginning to be uncomfortable for him to walk. We did venous Dopplers. He was quite concerned about arterial blood supply. His mother ended up with bilateral amputations. Venous Dopplers showed a ruptured baker cyst, no DVT. He was instructed on this and since that time his symptoms have resolved completely. He is on Coumadin for paroxysmal A-fib which led to the discoloration in his toes. Additionally, he has a history of abdominal aortic aneurysm and because he was concerned about his arterial disease, if he had it, we did Dopplers.  He is here for the results of the Dopplers. Bilateral ABIs were 1.0, though he does have appearance of an occluded right anterior tibial artery and a left posterior tib demonstrated a short segment of occlusive disease with reconstitution at the ankle. His pulses have been 2+. He has no claudication symptoms. Additionally, the duplex of his abdominal aorta was slightly, minimally changed from his last one. Previously it was 3.95 x 3.57, now he is 3.7 x 4.0, essentially the same, very slight increase. He has no abdominal pain and no other complaints. He has really no complaints today, feels quite well. He is not aware of any tachycardias or palpitations. He has a Medtronic pacemaker which was interrogated today. This demonstrates a battery voltage of 3.0V.  His a-fib burden is 1.6% and he is on amiodarone.  Other history includes bypass grafting in 1989; vein graft was stented in 2008. Last stress test was 2012, low risk study. EF was 49%. Pacemaker was placed for paroxysmal A-fib and bradycardia, sick sinus syndrome in 2011. He is also on warfarin.   Jon [name] underwent PFTs on 10/22/2012. This  showed a moderately severe restrictive limitation as well as moderately reduced diffusion capacity. Based on these findings I recommended discontinuing his amiodarone and he also stopped his pravastatin. Since that time he's noted that he feels quite a bit better including some minor improvement in his knee pain. He still feels like his left knee is bothering him and he is status post right TKR. 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 I did refer him to the [facility], but has not been contacted for appointment so far.  Since his last followup, Jon [name] was seen by Dr. Sallyanne Kuster for a device check in his device appeared to be working properly. He was having atrial fibrillation, but a little burden. He continued on warfarin which was generally therapeutic. Unfortunately, in December he had a thalamic stroke which resulted in some right facial droop and hemiparesis. That is improving , but slowly. He was then changed to Eliquis for anticoagulation. He was also started on Zetia, as he has a history of statin intolerance in the past.  An echo performed in the hospital demonstrated an EF of 40-45%, which is mildly reduced from previous studies. In addition, he has had a small amount of weight gain and lower extremity swelling which is noted over the past several weeks while in recovery.  Unfortunately, he suffered another stroke last month around the time that we plan to admit him for tikosyn induction. He is still trying to recover from that.   Jon [name] appears euvolemic  today. He does have a small amount of lower extremity swelling. His weight has been stable. He is reporting some epistaxis. He is concerned about the dose of his Eliquis. He is also reporting some urinary incontinence which is new. There is some flank pain and there is some possible concern for urinary tract infection however he is awaiting the results of a urinalysis.  PMHx:  Past Medical History  Diagnosis Date    . Hypertension   . Coronary artery disease   . Arthritis   . Cataract   . Anxiety   . Blood transfusion without reported diagnosis   . Hyperlipidemia   . Heart murmur   . Allergy   . Thyroid disease   . PAF (paroxysmal atrial fibrillation)     coumadin  . History of abdominal aortic aneurysm   . History of echocardiogram 12/2008    EF >55%; mild concentric LVH; mild MR; mild-mod TR; mild AV regurg;   . History of nuclear stress test 12/2010    lexiscan; low risk; compared to prior study, perfusion improved  . History of Doppler ultrasound 05/22/2012    LEAs; R anterior tibial artery appeared occluded; L posterior tibial shows short segment of occlusive ds  . History of Doppler ultrasound 05/22/2012    Abdominal Aortic Doppler; slight increase in fusiform aneurysm   . Stroke   . Adjustment disorder with mixed anxiety and depressed mood 04/18/2013    Post CVA depression and anxiety   . Abnormality of gait 04/18/2013  . 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 Increased exposure of the right cornea.   . Pruritic condition 01/24/2013  . Memory change 01/24/2013  . Abnormal PFTs 10/30/2012    Followed in Pulmonary clinic/ [facility]/ [place]  - PFT's 10/22/12 VC  55% no obst, DLCO 50%  - PFT's 12/20/2012 VC 83% and no obst, dLCO 55%  -11/08/2012  Walked RA x 3 laps @ 185 ft each stopped due to  End of study, not desat -11/08/12 esr 10    . Impotence of organic origin 10/03/2000  . Osteoarthrosis, unspecified whether generalized or localized, unspecified site 02/05/2003  . Hypertrophy of prostate with urinary obstruction and other lower urinary tract symptoms (LUTS) 04/28/2004  . Gout, unspecified 11/03/2004  . Acute on chronic systolic congestive heart failure, NYHA class 2 03/16/2006    BNP 376.9 05/28/13   . Atherosclerosis of renal artery 10/18/2006  . Unspecified vitamin D deficiency 10/18/2006  . Obstructive sleep apnea (adult) (pediatric) 07/23/2008  . Major  depressive disorder, single episode, unspecified 03/05/2009  . Basal cell carcinoma of skin of other and unspecified parts of face 12/24/2009  . Internal hemorrhoids without mention of complication 88/82/8003  . Muscle weakness (generalized) 03/10/2011  . Pain in joint, lower leg 08/04/2011  . Nocturia 10/27/2011  . Aortic aneurysm of unspecified site without mention of rupture 10/27/2011  . Fall 03/04/2011  . Hyponatremia 03/04/2011  . TIA (transient ischemic attack) 04/30/2013  . Fatigue 04/18/2013  . 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 CVA - Rt brain stroke 12/14 02/18/2013    02/19/13 angiography CT head:  Diffuse atherosclerotic irregularity and plaque formation of the distal right common carotid artery and proximal internal carotid artery without significant stenosis. Plaque ulceration is present and is a source of emboli. A small right thalamic CVA    . Dyslipidemia 11/03/2004  . Type II or unspecified type  diabetes mellitus without mention of complication, not stated as uncontrolled 11/24/2004  . Spinal stenosis in cervical region 12/15/2004  . Right bundle branch block 04/19/2006  . Cardiac pacemaker in situ- MDT 10/11 03/18/2010    Medtronic revo implant in October 2011. Severe sinus bradycardia in the 30s, but not truly pacemaker dependent   . Long term (current) use of anticoagulants 03/17/2011  . Hypothyroidism 03/04/2011  . HTN (hypertension) 03/04/2011  . Atrial fibrillation 03/04/2011     He was on coumadin until his stroke last December and was switched to apixaban 2.78m daily which he has been taking faithfully without reported side effects.      Past Surgical History  Procedure Laterality Date  . Coronary artery bypass graft  1989  . Pacemaker insertion  2011  . Insert / replace / remove pacemaker    . Eye surgery    . Joint replacement    . Hernia repair    . Coronary angioplasty with stent placement  2008    stent to  SVG to OM    FAMHx:  Family History  Problem Relation Age of Onset  . Diabetes Father   . Other Mother     PAD with amputations    SOCHx:   reports that he quit smoking about 46 years ago. His smoking use included Cigarettes. He has a 11.25 pack-year smoking history. He has never used smokeless tobacco. He reports that he does not drink alcohol or use illicit drugs.  ALLERGIES:  Allergies  Allergen Reactions  . Caduet [Amlodipine-Atorvastatin] Other (See Comments)    Weakness   . Crestor [Rosuvastatin] Other (See Comments)    Muscle pain/weakness  . Cymbalta [Duloxetine Hcl] Other (See Comments)    Excessive sedation  . Lipitor [Atorvastatin Calcium] Other (See Comments)    Muscle pain/weakness  . Lisinopril Other (See Comments)    Doesn't remember reaction  . Simvastatin Other (See Comments)    Muscle pain/weakness    ROS: A comprehensive review of systems was negative except for: Respiratory: positive for dyspnea on exertion Cardiovascular: positive for irregular heart beat and lower extremity edema  HOME MEDS: Current Outpatient Prescriptions  Medication Sig Dispense Refill  . acetaminophen (TYLENOL) 325 MG tablet Take 2 tablets (650 mg total) by mouth 2 (two) times daily. DO NOT EXCEED 4 TABLETS DAILY  360 tablet  1  . allopurinol (ZYLOPRIM) 100 MG tablet Take 50 mg by mouth daily.       .Marland Kitchenamoxicillin (AMOXIL) 500 MG capsule Take 500 mg by mouth. Take 4 caps prior to dential      . Cholecalciferol (VITAMIN D) 400 UNITS capsule Take 400 Units by mouth 2 (two) times daily.       . furosemide (LASIX) 80 MG tablet Take 40 mg by mouth daily.       .Marland Kitchenipratropium (ATROVENT) 0.06 % nasal spray One puff in each nostril before each meal  15 mL  12  . levothyroxine (SYNTHROID, LEVOTHROID) 100 MCG tablet Take 100 mcg by mouth daily before breakfast.      . LORazepam (ATIVAN) 0.5 MG tablet Take 0.5 mg by mouth. Take one tablet at bedtime for anxiety      . losartan (COZAAR) 100  MG tablet Take 1 tablet (100 mg total) by mouth daily.  30 tablet  5  . metoprolol tartrate (LOPRESSOR) 25 MG tablet Take 3 tablets (75 mg total) by mouth 2 (two) times daily.  120 tablet  0  . nitroGLYCERIN (NITROSTAT)  0.4 MG SL tablet Place 0.4 mg under the tongue every 5 (five) minutes as needed for chest pain.       Marland Kitchen olopatadine (PATANOL) 0.1 % ophthalmic solution One drop in each eye twice daily to help conjunctival irritation  5 mL  12  . polyvinyl alcohol (LIQUIFILM TEARS) 1.4 % ophthalmic solution Place 1 drop into both eyes. One drop in both eyes twice daily as needed for dry eyes      . potassium chloride (MICRO-K) 10 MEQ CR capsule Take 10 mEq by mouth 2 (two) times daily.      Marland Kitchen zolpidem (AMBIEN) 10 MG tablet Take 5 mg by mouth at bedtime as needed for sleep.       Marland Kitchen apixaban (ELIQUIS) 2.5 MG TABS tablet Take 1 tablet (2.5 mg total) by mouth 2 (two) times daily.  60 tablet  11   No current facility-administered medications for this visit.    LABS/IMAGING: No results found for this or any previous visit (from the past 48 hour(s)). No results found.  VITALS: BP 110/66  Pulse 63  Ht _0  (1.727 m)  Wt 161 lb 8 oz (73.256 kg)  BMI 24.56 kg/m2  EXAM: GEN: Awake, NAD HEENT: PERRLA, right lid appears retracted, right eye proptotic PULM: lungs with decreased BS at bases CV: Regular rate and rhythm, s1/s2 GI: Abdomen soft, non-tender, +BS VASCULAR: 2+ pulses, right CEA incision noted NEURO: right facial droop, slightly slurred speech PSYCH: Mood, affect normal, pleasant MSK: good ROM, strength 5/5 LE bilaterally, right handgrip 4/5 DERM: chronic LE venous stasis changes, bilateral trace edema  EKG: V-paced at 66, underlying a-fib  ASSESSMENT: 1. Acute on chronic systolic, congestive heart failure, NYHA Class II symptoms 2. Persistent atrial fibrillation 3. Sick sinus syndrome status post pacemaker placement with normal function 4. Abnormal PFT's - off of  amiodarone 5. History of abdominal aortic aneurysm with mild enlargement 6. Coronary artery disease status post CABG in 1989 with stent to the vein graft in 2008 7. Minimal PAD with normal ABIs bilaterally 8. Recent thalamic stroke - now on Eliquis 9. Acute on chronic, systolic congestive heart failure - EF 40-45% 10. Epistaxis 11. Urinary incontinence  PLAN: 1.   Jon [name] appears to be more euvolemic with the increased dose of his diuretics. Weight appears to be stable and he has lower extremity swelling. I've recommended low compression TED hose to both legs. In addition he is having problems with incontinence. A urinalysis is pending to rule out urinary tract infection as he does have some left flank pain. If this is negative I would recommend a urology consult which they have asked me to provide. Finally, he is having some epistaxis. This could be related to his chronic vasomotor rhinitis and use of ipratropium nasal spray. I recommended decreasing his outlook was to 2.5 mg daily given his age, borderline weight and mildly reduced GFR for his age. Hopefully this will decrease his risk of bleeding. Plan to see him back in 6 months.  Pixie Casino, MD, [facility] Attending Cardiologist [facility] 10/22/2013, 10:27 AM

## 2013-10-24 ENCOUNTER — Encounter: Payer: Self-pay | Admitting: Nurse Practitioner

## 2013-10-24 ENCOUNTER — Non-Acute Institutional Stay: Payer: Medicare Other | Admitting: Nurse Practitioner

## 2013-10-24 DIAGNOSIS — I509 Heart failure, unspecified: Secondary | ICD-10-CM

## 2013-10-24 DIAGNOSIS — E039 Hypothyroidism, unspecified: Secondary | ICD-10-CM

## 2013-10-24 DIAGNOSIS — K219 Gastro-esophageal reflux disease without esophagitis: Secondary | ICD-10-CM

## 2013-10-24 DIAGNOSIS — M109 Gout, unspecified: Secondary | ICD-10-CM

## 2013-10-24 DIAGNOSIS — R040 Epistaxis: Secondary | ICD-10-CM

## 2013-10-24 DIAGNOSIS — I5023 Acute on chronic systolic (congestive) heart failure: Secondary | ICD-10-CM

## 2013-10-24 DIAGNOSIS — I480 Paroxysmal atrial fibrillation: Secondary | ICD-10-CM

## 2013-10-24 DIAGNOSIS — I10 Essential (primary) hypertension: Secondary | ICD-10-CM

## 2013-10-24 DIAGNOSIS — F4323 Adjustment disorder with mixed anxiety and depressed mood: Secondary | ICD-10-CM

## 2013-10-24 DIAGNOSIS — I4891 Unspecified atrial fibrillation: Secondary | ICD-10-CM

## 2013-10-24 DIAGNOSIS — N401 Enlarged prostate with lower urinary tract symptoms: Secondary | ICD-10-CM | POA: Diagnosis not present

## 2013-10-24 NOTE — Assessment & Plan Note (Signed)
C/o on and off abd discomfort-he attributed to his orange juice consumption-stopped now-observe-may consider PPI if no better.

## 2013-10-24 NOTE — Assessment & Plan Note (Signed)
No flare ups. Takes Allopurinol 50mg daily.   

## 2013-10-24 NOTE — Assessment & Plan Note (Signed)
takes Levothyroxine 78mcg, TSH 0.617 08/29/13

## 2013-10-24 NOTE — Assessment & Plan Note (Signed)
Rate controlled, takes Metoprolol 75mg  bid.

## 2013-10-24 NOTE — Assessment & Plan Note (Signed)
Worse urinary incontinence-pending Urology consult.

## 2013-10-24 NOTE — Assessment & Plan Note (Signed)
Hx of CABG, pacemaker, A-fib-compensated. Takes Furosemide 80mg  now. F/u Cardiology 10/22/13.

## 2013-10-24 NOTE — Progress Notes (Signed)
Patient ID: Jon [name], male   DOB: October 28, 1921, 80 y.o.   MRN: [id number]   Code Status: DNR  Allergies  Allergen Reactions  . Caduet [Amlodipine-Atorvastatin] Other (See Comments)    Weakness   . Crestor [Rosuvastatin] Other (See Comments)    Muscle pain/weakness  . Cymbalta [Duloxetine Hcl] Other (See Comments)    Excessive sedation  . Lipitor [Atorvastatin Calcium] Other (See Comments)    Muscle pain/weakness  . Lisinopril Other (See Comments)    Doesn't remember reaction  . Simvastatin Other (See Comments)    Muscle pain/weakness    Chief Complaint  Patient presents with  . Medical Management of Chronic Issues  . Acute Visit    frequent nose bleed, Afib, hx of CVA, abd discomfort on and off, urinary incontinentce    HPI: Patient is a 80 y.o. male seen in the clinic at [facility] today for evaluation of dizziness,  L foot cramp, and other chronic medical conditions.    Hospitalized from 04/30/2013 to 05/03/2013 for RLE weakness which lasted about 30 minutes. TIA was considered given nature of the problem and CT brain findings/MRI and MRA brain. He was discharged with increased Eliquis and adjusting of antihypertensive medications.   Problem List Items Addressed This Visit   Acute on chronic systolic congestive heart failure, NYHA class 2     Hx of CABG, pacemaker, A-fib-compensated. Takes Furosemide $RemoveBeforeDE'80mg'UvCVddhupiamGYO$  now. F/u Cardiology 10/22/13.       Adjustment disorder with mixed anxiety and depressed mood     Takes Lorazepam nightly and prn Ambien for rest at night.       Atrial fibrillation (Chronic)     Rate controlled, takes Metoprolol $RemoveBeforeDE'75mg'MhlSjoZMvEKvDqY$  bid.      Epistaxis - Primary     Several episodes in the recent past. Cardiology reduced Eliquis 2.$RemoveBeforeDEI'5mg'PgoUFGdhXTZEkjlu$  bid 10/22/13. May consider ENT if no better. Update CBC    GERD (gastroesophageal reflux disease)     C/o on and off abd discomfort-he attributed to his orange juice consumption-stopped now-observe-may consider PPI if  no better.     Gout, unspecified     No flare ups. Takes Allopurinol $RemoveBeforeDEI'50mg'VxbEmrLUiVbCjCgf$  daily.       HTN (hypertension) (Chronic)     Metoprolol 75 mg twice a day and Losartan $RemoveBefo'100mg'PamboXJJrtq$  daily and Furosemide $RemoveBefore'80mg'nsuDgcJlneMOp$  daily-controlled      Hypertrophy of prostate with urinary obstruction and other lower urinary tract symptoms (LUTS)     Worse urinary incontinence-pending Urology consult.     Hypothyroidism (Chronic)     takes Levothyroxine 76mcg, TSH 0.617 08/29/13         Review of Systems:  Review of Systems  Constitutional: Negative for fever, chills, weight loss, malaise/fatigue and diaphoresis.  HENT: Positive for hearing loss. Negative for congestion, ear discharge, ear pain, nosebleeds, sore throat and tinnitus.        Recent several nose bleed-Cardiology reduced Eliquis to 2.$RemoveBefo'5mg'UorcWqtECHA$  bid 10/22/13  Eyes: Negative for blurred vision, double vision, photophobia, pain, discharge and redness.       Dry eyes. Especially the right eye since its incomplete closure since the CVA  Respiratory: Positive for cough. Negative for hemoptysis, sputum production, shortness of breath, wheezing and stridor.        Hacking  Cardiovascular: Positive for leg swelling. Negative for chest pain, palpitations, orthopnea, claudication and PND.       Edema is not apparent today since Furosemide $RemoveBeforeDE'80mg'YSJhufEogvNdaAE$  daily  Gastrointestinal: Positive for heartburn. Negative for nausea, abdominal pain, diarrhea,  constipation and blood in stool.       On and off abd/upigastric area discomfort-stopped orange juice.   Genitourinary: Positive for frequency. Negative for dysuria, urgency, hematuria and flank pain.       Cannot make it to commode sometimes. Kegel exercise and adult depends. Pending Urology appointment  Musculoskeletal: Positive for joint pain. Negative for back pain, falls, myalgias and neck pain.       Left knee pain. Left mid/lower back pain-resolved-urine culture negative for UTI.   Skin: Positive for itching. Negative for rash.        Chronic and mild.  Neurological: Positive for speech change and focal weakness. Negative for dizziness, tingling, tremors, sensory change, seizures, loss of consciousness, weakness and headaches.       Mild R facial droop and right sided weakness. Grip strength 5/5-but weaker than the left. Unsteady gait. Aphasia is better.   Endo/Heme/Allergies: Positive for environmental allergies. Negative for polydipsia. Does not bruise/bleed easily.  Psychiatric/Behavioral: Positive for depression and memory loss. Negative for suicidal ideas, hallucinations and substance abuse. The patient is nervous/anxious and has insomnia.        Sleeps better     Past Medical History  Diagnosis Date  . Hypertension   . Coronary artery disease   . Arthritis   . Cataract   . Anxiety   . Blood transfusion without reported diagnosis   . Hyperlipidemia   . Heart murmur   . Allergy   . Thyroid disease   . PAF (paroxysmal atrial fibrillation)     coumadin  . History of abdominal aortic aneurysm   . History of echocardiogram 12/2008    EF >55%; mild concentric LVH; mild MR; mild-mod TR; mild AV regurg;   . History of nuclear stress test 12/2010    lexiscan; low risk; compared to prior study, perfusion improved  . History of Doppler ultrasound 05/22/2012    LEAs; R anterior tibial artery appeared occluded; L posterior tibial shows short segment of occlusive ds  . History of Doppler ultrasound 05/22/2012    Abdominal Aortic Doppler; slight increase in fusiform aneurysm   . Stroke   . Adjustment disorder with mixed anxiety and depressed mood 04/18/2013    Post CVA depression and anxiety   . Abnormality of gait 04/18/2013  . 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 Increased exposure of the right cornea.   . Pruritic condition 01/24/2013  . Memory change 01/24/2013  . Abnormal PFTs 10/30/2012    Followed in Pulmonary clinic/ [facility]/ [place]  - PFT's 10/22/12 VC  55% no obst, DLCO 50%   - PFT's 12/20/2012 VC 83% and no obst, dLCO 55%  -11/08/2012  Walked RA x 3 laps @ 185 ft each stopped due to  End of study, not desat -11/08/12 esr 10    . Impotence of organic origin 10/03/2000  . Osteoarthrosis, unspecified whether generalized or localized, unspecified site 02/05/2003  . Hypertrophy of prostate with urinary obstruction and other lower urinary tract symptoms (LUTS) 04/28/2004  . Gout, unspecified 11/03/2004  . Acute on chronic systolic congestive heart failure, NYHA class 2 03/16/2006    BNP 376.9 05/28/13   . Atherosclerosis of renal artery 10/18/2006  . Unspecified vitamin D deficiency 10/18/2006  . Obstructive sleep apnea (adult) (pediatric) 07/23/2008  . Major depressive disorder, single episode, unspecified 03/05/2009  . Basal cell carcinoma of skin of other and unspecified parts of face 12/24/2009  . Internal hemorrhoids without mention of complication  02/03/2011  . Muscle weakness (generalized) 03/10/2011  . Pain in joint, lower leg 08/04/2011  . Nocturia 10/27/2011  . Aortic aneurysm of unspecified site without mention of rupture 10/27/2011  . Fall 03/04/2011  . Hyponatremia 03/04/2011  . TIA (transient ischemic attack) 04/30/2013  . Fatigue 04/18/2013  . 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 CVA - Rt brain stroke 12/14 02/18/2013    02/19/13 angiography CT head:  Diffuse atherosclerotic irregularity and plaque formation of the distal right common carotid artery and proximal internal carotid artery without significant stenosis. Plaque ulceration is present and is a source of emboli. A small right thalamic CVA    . Dyslipidemia 11/03/2004  . Type II or unspecified type diabetes mellitus without mention of complication, not stated as uncontrolled 11/24/2004  . Spinal stenosis in cervical region 12/15/2004  . Right bundle branch block 04/19/2006  . Cardiac pacemaker in situ- MDT 10/11 03/18/2010    Medtronic revo implant in October  2011. Severe sinus bradycardia in the 30s, but not truly pacemaker dependent   . Long term (current) use of anticoagulants 03/17/2011  . Hypothyroidism 03/04/2011  . HTN (hypertension) 03/04/2011  . Atrial fibrillation 03/04/2011     He was on coumadin until his stroke last December and was switched to apixaban 2.$RemoveBefor'5mg'HnbTepQoKUYn$  daily which he has been taking faithfully without reported side effects.     Past Surgical History  Procedure Laterality Date  . Coronary artery bypass graft  1989  . Pacemaker insertion  2011  . Insert / replace / remove pacemaker    . Eye surgery    . Joint replacement    . Hernia repair    . Coronary angioplasty with stent placement  2008    stent to SVG to OM   Social History:   reports that he quit smoking about 46 years ago. His smoking use included Cigarettes. He has a 11.25 pack-year smoking history. He has never used smokeless tobacco. He reports that he does not drink alcohol or use illicit drugs.  Family History  Problem Relation Age of Onset  . Diabetes Father   . Other Mother     PAD with amputations    Medications: Patient's Medications  New Prescriptions   No medications on file  Previous Medications   ACETAMINOPHEN (TYLENOL) 325 MG TABLET    Take 2 tablets (650 mg total) by mouth 2 (two) times daily. DO NOT EXCEED 4 TABLETS DAILY   ALLOPURINOL (ZYLOPRIM) 100 MG TABLET    Take 50 mg by mouth daily.    AMOXICILLIN (AMOXIL) 500 MG CAPSULE    Take 500 mg by mouth. Take 4 caps prior to dential   APIXABAN (ELIQUIS) 2.5 MG TABS TABLET    Take 1 tablet (2.5 mg total) by mouth 2 (two) times daily.   CHOLECALCIFEROL (VITAMIN D) 400 UNITS CAPSULE    Take 400 Units by mouth 2 (two) times daily.    FUROSEMIDE (LASIX) 80 MG TABLET    Take 40 mg by mouth daily.    IPRATROPIUM (ATROVENT) 0.06 % NASAL SPRAY    One puff in each nostril before each meal   LEVOTHYROXINE (SYNTHROID, LEVOTHROID) 100 MCG TABLET    Take 100 mcg by mouth daily before breakfast.   LORAZEPAM  (ATIVAN) 0.5 MG TABLET    Take 0.5 mg by mouth. Take one tablet at bedtime for anxiety   LOSARTAN (COZAAR) 100 MG TABLET  Take 1 tablet (100 mg total) by mouth daily.   METOPROLOL TARTRATE (LOPRESSOR) 25 MG TABLET    Take 3 tablets (75 mg total) by mouth 2 (two) times daily.   NITROGLYCERIN (NITROSTAT) 0.4 MG SL TABLET    Place 0.4 mg under the tongue every 5 (five) minutes as needed for chest pain.    OLOPATADINE (PATANOL) 0.1 % OPHTHALMIC SOLUTION    One drop in each eye twice daily to help conjunctival irritation   POLYVINYL ALCOHOL (LIQUIFILM TEARS) 1.4 % OPHTHALMIC SOLUTION    Place 1 drop into both eyes. One drop in both eyes twice daily as needed for dry eyes   POTASSIUM CHLORIDE (MICRO-K) 10 MEQ CR CAPSULE    Take 10 mEq by mouth 2 (two) times daily.   ZOLPIDEM (AMBIEN) 10 MG TABLET    Take 5 mg by mouth at bedtime as needed for sleep.   Modified Medications   No medications on file  Discontinued Medications   No medications on file   Physical Exam: Physical Exam  Nursing note and vitals reviewed. Constitutional: He is oriented to person, place, and time. He appears well-developed and well-nourished. No distress.  HENT:  Severe hearing loss. Bilateral aides.Right facial droop.   Eyes:  Corrective lenses. Increased exposure of the right cornea due to post CVA facial droop.  Neck: No JVD present. No tracheal deviation present. No thyromegaly present.  Cardiovascular: Normal rate.  Frequent extrasystoles are present.  Murmur heard. Pacemaker left upper chest. Systolic murmur 6-3/7  Pulmonary/Chest: Effort normal. He has no wheezes. He has rales.  dry rales posterior lung bases.   Abdominal: Soft. Bowel sounds are normal. He exhibits no distension and no mass. There is no tenderness.  Musculoskeletal: Normal range of motion. He exhibits edema. He exhibits no tenderness.  Unstable gait. Using 4 wheel walker and w/c. Weaker on the right side. Hx of the right total knee replacement.  Edema is not apparent today.   Lymphadenopathy:    He has no cervical adenopathy.  Neurological: He is alert and oriented to person, place, and time. He displays normal reflexes. No cranial nerve deficit. He exhibits normal muscle tone. Coordination normal.  Slurred speech. Right side weakness.   Skin: Skin is warm and dry. No rash noted. No erythema. No pallor.  Psychiatric: He has a normal mood and affect. His behavior is normal. Judgment and thought content normal.   Filed Vitals:   10/24/13 1102  BP: 132/60  Pulse: 60  Temp: 97.9 F (36.6 C)  TempSrc: Tympanic  Resp: 16   Labs reviewed: Basic Metabolic Panel:  Recent Labs  02/22/13 0943  03/27/13 1636 04/30/13 1424 04/30/13 1425 05/14/13 05/16/13 05/28/13 08/29/13 09/17/13  NA 130*  < > 134* 141  --  139  --  140 137 138  K 4.0  < > 4.7 4.2  --  4.5  --  4.0 4.6 4.4  CL 97  --  99 104  --   --   --   --   --   --   CO2 23  --  25 24  --   --   --   --   --   --   GLUCOSE 131*  --  107* 111*  --   --   --   --   --   --   BUN 14  < > 25* 28*  --  19  --  25* 23* 20  CREATININE 0.74  < >  1.18 1.11  --  1.1  --  1.1 1.1 1.0  CALCIUM 8.7  --  9.7 9.0  --   --   --   --   --   --   MG  --   --  2.3  --  2.2  --   --   --   --   --   TSH  --   --  6.812*  --   --  3.68 3.15  --  0.62  --   < > = values in this interval not displayed. Liver Function Tests:  Recent Labs  02/18/13 2025 03/27/13 1636 04/30/13 1424 05/14/13  AST $Re'23 22 21 18  'biE$ ALT $R'16 19 16 16  'vQ$ ALKPHOS 90 81 81 86  BILITOT 0.7 1.1 0.7  --   PROT 6.7 6.8 6.4  --   ALBUMIN 3.6 4.3 3.3*  --    CBC:  Recent Labs  11/08/12 1611 02/18/13 2025 02/18/13 2032 03/27/13 1636 04/30/13 1424  WBC 7.2 6.9  --  9.0 6.9  NEUTROABS 4.7 3.7  --   --  4.5  HGB 16.0 15.5 16.3 16.5 15.7  HCT 47.4 43.1 48.0 47.2 44.6  MCV 95.4 92.3  --  93.7 92.9  PLT 260.0 217  --  241 207   Lipid Panel:  Recent Labs  02/19/13 0355 03/26/13 05/01/13 0528  CHOL 187 89 117    HDL 34* 31* 30*  LDLCALC 117* 47 71  TRIG 181* 54 79  CHOLHDL 5.5  --  3.9   Past Procedures:   04/30/2013   CLINICAL DATA:  Stroke.  Ex-smoker.  EXAM: CHEST  2 VIEW  .  IMPRESSION: Minimal bibasilar linear atelectasis.     04/30/2013   CLINICAL DATA:  TRANSIENT ISCHEMIC ATTACK  EXAM: CT HEAD WITHOUT CONTRAST  IMPRESSION: Findings consistent with an area of subacute to chronic lacunar infarction left MCA distribution. Involutional and chronic changes also appreciated. There is no evidence of acute abnormalities.     05/01/2013    EXAM: MRI HEAD WITHOUT CONTRAST  MRA HEAD WITHOUT CONTRAST    IMPRESSION: 1. Small acute/subacute infarct left corona radiata. No mass effect or hemorrhage. 2. Three superimposed punctate acute lacunar infarcts (right caudate nucleus, left occipital pole, left left parietal lobe). These could reflect synchronous small vessel ischemia rather than a recent embolic event. 3. Underlying chronic small vessel ischemia. 4. Intracranial atherosclerosis. Fusiform aneurysmal enlargement of the left ICA (cavernous segment) and right ICA terminus. No anterior circulation major branch occlusion. 5. Atherosclerosis in stenosis of the distal right vertebral artery which appears hemodynamically significant. Other posterior circulation atherosclerosis without stenosis.     Mr Jodene Nam Head/brain Wo Cm  05/01/2013  EXAM: MRI HEAD WITHOUT CONTRAST  MRA HEAD WITHOUT CONTRAST   IMPRESSION: 1. Small acute/subacute infarct left corona radiata. No mass effect or hemorrhage. 2. Three superimposed punctate acute lacunar infarcts (right caudate nucleus, left occipital pole, left left parietal lobe). These could reflect synchronous small vessel ischemia rather than a recent embolic event. 3. Underlying chronic small vessel ischemia. 4. Intracranial atherosclerosis. Fusiform aneurysmal enlargement of the left ICA (cavernous segment) and right ICA terminus. No anterior circulation major branch occlusion. 5.  Atherosclerosis in stenosis of the distal right vertebral artery which appears hemodynamically significant. Other posterior circulation atherosclerosis without stenosis.     EKG Atrial fibrillation Right bundle branch block ST depr, consider ischemia, anterolateral lds Baseline wander in lead(s) II III aVR aVL aVF V3  Assessment/Plan Epistaxis Several episodes in the recent past. Cardiology reduced Eliquis 2.$RemoveBeforeDEI'5mg'VMXIUKMFliNqPyqo$  bid 10/22/13. May consider ENT if no better. Update CBC  Acute on chronic systolic congestive heart failure, NYHA class 2 Hx of CABG, pacemaker, A-fib-compensated. Takes Furosemide $RemoveBeforeDE'80mg'xIDjgzXYfybnrcE$  now. F/u Cardiology 10/22/13.     GERD (gastroesophageal reflux disease) C/o on and off abd discomfort-he attributed to his orange juice consumption-stopped now-observe-may consider PPI if no better.   Hypertrophy of prostate with urinary obstruction and other lower urinary tract symptoms (LUTS) Worse urinary incontinence-pending Urology consult.   Hypothyroidism takes Levothyroxine 53mcg, TSH 0.617 08/29/13    Gout, unspecified No flare ups. Takes Allopurinol $RemoveBeforeDEI'50mg'ZtAqJGhWIDzalvul$  daily.     HTN (hypertension) Metoprolol 75 mg twice a day and Losartan $RemoveBefo'100mg'hOtwSfolYYM$  daily and Furosemide $RemoveBefore'80mg'aumiDSJYNSKvv$  daily-controlled    Adjustment disorder with mixed anxiety and depressed mood Takes Lorazepam nightly and prn Ambien for rest at night.     Atrial fibrillation Rate controlled, takes Metoprolol $RemoveBeforeDE'75mg'oJgbjfZMUuGBuaw$  bid.      Family/ Staff Communication: observe the patient.   Goals of Care: AL  Labs/tests ordered: CBC

## 2013-10-24 NOTE — Assessment & Plan Note (Signed)
Takes Lorazepam nightly and prn Ambien for rest at night.

## 2013-10-24 NOTE — Assessment & Plan Note (Signed)
Metoprolol 75 mg twice a day and Losartan 100mg daily and Furosemide 80mg daily-controlled  

## 2013-10-24 NOTE — Assessment & Plan Note (Addendum)
Several episodes in the recent past. Cardiology reduced Eliquis 2.5mg  bid 10/22/13. May consider ENT if no better. Update CBC

## 2013-10-29 DIAGNOSIS — D649 Anemia, unspecified: Secondary | ICD-10-CM | POA: Diagnosis not present

## 2013-10-29 LAB — CBC AND DIFFERENTIAL
HCT: 45 % (ref 41–53)
HEMOGLOBIN: 15.6 g/dL (ref 13.5–17.5)
Platelets: 252 10*3/uL (ref 150–399)
WBC: 6.8 10^3/mL

## 2013-10-31 ENCOUNTER — Other Ambulatory Visit: Payer: Self-pay | Admitting: Nurse Practitioner

## 2013-11-04 ENCOUNTER — Telehealth: Payer: Self-pay | Admitting: Cardiology

## 2013-11-04 ENCOUNTER — Encounter: Payer: Medicare Other | Admitting: *Deleted

## 2013-11-04 ENCOUNTER — Encounter: Payer: Self-pay | Admitting: Cardiology

## 2013-11-04 NOTE — Telephone Encounter (Signed)
LMOVM reminding pt to send remote transmission.   

## 2013-11-04 NOTE — Telephone Encounter (Signed)
Spoke with pt and said he attempted to send remote transmission but monitor will not go past the first light. Pt informed me that he has [employer] for his phone service. I informed pt that we tend to have issues with [employer] and that I would mail him a filter for his phone. Pt verbalized understanding.

## 2013-11-05 ENCOUNTER — Encounter: Payer: Self-pay | Admitting: Cardiology

## 2013-11-08 DIAGNOSIS — N3941 Urge incontinence: Secondary | ICD-10-CM | POA: Diagnosis not present

## 2013-11-08 DIAGNOSIS — R351 Nocturia: Secondary | ICD-10-CM | POA: Diagnosis not present

## 2013-11-08 DIAGNOSIS — N400 Enlarged prostate without lower urinary tract symptoms: Secondary | ICD-10-CM | POA: Diagnosis not present

## 2013-11-12 DIAGNOSIS — E119 Type 2 diabetes mellitus without complications: Secondary | ICD-10-CM | POA: Diagnosis not present

## 2013-11-12 DIAGNOSIS — B351 Tinea unguium: Secondary | ICD-10-CM | POA: Diagnosis not present

## 2013-11-21 ENCOUNTER — Ambulatory Visit (INDEPENDENT_AMBULATORY_CARE_PROVIDER_SITE_OTHER): Payer: Medicare Other | Admitting: *Deleted

## 2013-11-21 DIAGNOSIS — I4891 Unspecified atrial fibrillation: Secondary | ICD-10-CM

## 2013-11-21 LAB — MDC_IDC_ENUM_SESS_TYPE_REMOTE
Battery Voltage: 2.99 V
Brady Statistic AP VP Percent: 1.16 %
Brady Statistic RA Percent Paced: 1.7 %
Date Time Interrogation Session: 20150917190510
Lead Channel Impedance Value: 448 Ohm
Lead Channel Setting Pacing Amplitude: 2.5 V
Lead Channel Setting Pacing Pulse Width: 0.4 ms
Lead Channel Setting Sensing Sensitivity: 0.9 mV
MDC IDC MSMT LEADCHNL RV IMPEDANCE VALUE: 432 Ohm
MDC IDC SET LEADCHNL RA PACING AMPLITUDE: 2 V
MDC IDC SET ZONE DETECTION INTERVAL: 400 ms
MDC IDC STAT BRADY AP VS PERCENT: 0.54 %
MDC IDC STAT BRADY AS VP PERCENT: 45.54 %
MDC IDC STAT BRADY AS VS PERCENT: 52.76 %
MDC IDC STAT BRADY RV PERCENT PACED: 46.7 %
Zone Setting Detection Interval: 350 ms

## 2013-11-21 NOTE — Progress Notes (Signed)
Remote pacemaker transmission.   

## 2013-11-26 ENCOUNTER — Encounter: Payer: Self-pay | Admitting: Cardiology

## 2013-11-29 ENCOUNTER — Encounter: Payer: Self-pay | Admitting: Cardiovascular Disease

## 2013-12-22 DIAGNOSIS — I10 Essential (primary) hypertension: Secondary | ICD-10-CM | POA: Diagnosis not present

## 2013-12-22 DIAGNOSIS — N3001 Acute cystitis with hematuria: Secondary | ICD-10-CM | POA: Diagnosis not present

## 2013-12-22 DIAGNOSIS — E039 Hypothyroidism, unspecified: Secondary | ICD-10-CM | POA: Diagnosis not present

## 2013-12-22 DIAGNOSIS — R509 Fever, unspecified: Secondary | ICD-10-CM | POA: Diagnosis not present

## 2013-12-23 LAB — BASIC METABOLIC PANEL
BUN: 19 mg/dL (ref 4–21)
Creatinine: 0.8 mg/dL (ref 0.6–1.3)
GLUCOSE: 121 mg/dL
Potassium: 4.3 mmol/L (ref 3.4–5.3)
SODIUM: 131 mmol/L — AB (ref 137–147)

## 2013-12-23 LAB — CBC AND DIFFERENTIAL
HEMATOCRIT: 43 % (ref 41–53)
Hemoglobin: 15 g/dL (ref 13.5–17.5)
Platelets: 174 10*3/uL (ref 150–399)
WBC: 12.2 10^3/mL

## 2013-12-24 DIAGNOSIS — N390 Urinary tract infection, site not specified: Secondary | ICD-10-CM | POA: Diagnosis not present

## 2013-12-26 ENCOUNTER — Non-Acute Institutional Stay: Payer: Medicare Other | Admitting: Nurse Practitioner

## 2013-12-26 ENCOUNTER — Encounter: Payer: Self-pay | Admitting: Nurse Practitioner

## 2013-12-26 DIAGNOSIS — N390 Urinary tract infection, site not specified: Secondary | ICD-10-CM | POA: Diagnosis not present

## 2013-12-26 DIAGNOSIS — Z7901 Long term (current) use of anticoagulants: Secondary | ICD-10-CM

## 2013-12-26 DIAGNOSIS — E039 Hypothyroidism, unspecified: Secondary | ICD-10-CM

## 2013-12-26 DIAGNOSIS — F4323 Adjustment disorder with mixed anxiety and depressed mood: Secondary | ICD-10-CM

## 2013-12-26 DIAGNOSIS — I5023 Acute on chronic systolic (congestive) heart failure: Secondary | ICD-10-CM

## 2013-12-26 DIAGNOSIS — I10 Essential (primary) hypertension: Secondary | ICD-10-CM

## 2013-12-26 DIAGNOSIS — M1000 Idiopathic gout, unspecified site: Secondary | ICD-10-CM

## 2013-12-26 DIAGNOSIS — E871 Hypo-osmolality and hyponatremia: Secondary | ICD-10-CM | POA: Insufficient documentation

## 2013-12-26 DIAGNOSIS — N401 Enlarged prostate with lower urinary tract symptoms: Secondary | ICD-10-CM

## 2013-12-26 DIAGNOSIS — K219 Gastro-esophageal reflux disease without esophagitis: Secondary | ICD-10-CM

## 2013-12-26 DIAGNOSIS — I480 Paroxysmal atrial fibrillation: Secondary | ICD-10-CM

## 2013-12-26 DIAGNOSIS — E1159 Type 2 diabetes mellitus with other circulatory complications: Secondary | ICD-10-CM

## 2013-12-26 DIAGNOSIS — R319 Hematuria, unspecified: Secondary | ICD-10-CM

## 2013-12-26 NOTE — Assessment & Plan Note (Signed)
BNP 376.9 05/28/13 08/29/13 BNP 284.1 Continue Furosemide 40mg  daily-may hold if oral intake is limited.

## 2013-12-26 NOTE — Assessment & Plan Note (Signed)
No flare ups. Takes Allopurinol 50mg daily.   

## 2013-12-26 NOTE — Assessment & Plan Note (Signed)
Rate controlled, takes Metoprolol 75mg  bid.

## 2013-12-26 NOTE — Assessment & Plan Note (Signed)
Takes Lorazepam nightly and prn Ambien for rest at night.

## 2013-12-26 NOTE — Assessment & Plan Note (Signed)
08/29/13 Hgb A1c 6.5 S/p CVA Diet controlled.

## 2013-12-26 NOTE — Assessment & Plan Note (Addendum)
12/25/13 urine culture: E Coli >100,000c/ml, complete Cipro 500mg  bid x 7days. No further hematuria since initial x1.

## 2013-12-26 NOTE — Progress Notes (Signed)
Patient ID: Jon [name], male   DOB: 1921/07/02, 80 y.o.   MRN: [id number]   Code Status: DNR  Allergies  Allergen Reactions  . Caduet [Amlodipine-Atorvastatin] Other (See Comments)    Weakness   . Crestor [Rosuvastatin] Other (See Comments)    Muscle pain/weakness  . Cymbalta [Duloxetine Hcl] Other (See Comments)    Excessive sedation  . Lipitor [Atorvastatin Calcium] Other (See Comments)    Muscle pain/weakness  . Lisinopril Other (See Comments)    Doesn't remember reaction  . Simvastatin Other (See Comments)    Muscle pain/weakness    Chief Complaint  Patient presents with  . Medical Management of Chronic Issues  . Acute Visit    urinary frequency-UTI    HPI: Patient is a 80 y.o. male seen in the AL at [facility] today for evaluation of UTI and other chronic medical conditions.    Hospitalized from 04/30/2013 to 05/03/2013 for RLE weakness which lasted about 30 minutes. TIA was considered given nature of the problem and CT brain findings/MRI and MRA brain. He was discharged with increased Eliquis and adjusting of antihypertensive medications.   Problem List Items Addressed This Visit   UTI (urinary tract infection)     12/25/13 urine culture: E Coli >100,000c/ml, complete Cipro 580m bid x 7days. No further hematuria since initial x1.      Type 2 diabetes mellitus with circulatory disorder     08/29/13 Hgb A1c 6.5 S/p CVA Diet controlled.      Long term current use of anticoagulant therapy     Eliquis 2.53mbid.       Hypothyroidism (Chronic)     takes Levothyroxine 7540m TSH 0.617 08/29/13. Update TSH       Hyponatremia - Primary     12/23/13 Na 131, likely due to decreased oral intake related to onset of UTI in setting of chronic Furosemide use-will encourage oral intake, update BMP     HTN (hypertension) (Chronic)     Metoprolol 75 mg twice a day and Losartan 100m63mily and Furosemide 80mg53mly-controlled      Gout     No flare ups.  Takes Allopurinol 50mg 64my.       GERD (gastroesophageal reflux disease)     C/o on and off abd discomfort-he attributed to his orange juice consumption-stopped now-observe-better-may consider PPI if no better.      Enlarged prostate with lower urinary tract symptoms (LUTS)     Manage symptoms-s/p Urology consultation-no change since 08-2013    Atrial fibrillation (Chronic)     Rate controlled, takes Metoprolol 75mg b39m     Adjustment disorder with mixed anxiety and depressed mood     Takes Lorazepam nightly and prn Ambien for rest at night.       Acute on chronic systolic congestive heart failure, NYHA class 2     BNP 376.9 05/28/13 08/29/13 BNP 284.1 Continue Furosemide 40mg da71mmay hold if oral intake is limited.         Review of Systems:  Review of Systems  Constitutional: Negative for fever, chills, weight loss, malaise/fatigue and diaphoresis.  HENT: Positive for hearing loss. Negative for congestion, ear discharge, ear pain, nosebleeds, sore throat and tinnitus.        Recent several nose bleed-Cardiology reduced Eliquis to 2.5mg bid 47m8/15  Eyes: Negative for blurred vision, double vision, photophobia, pain, discharge and redness.       Dry eyes. Especially the right eye since its incomplete closure since  the CVA  Respiratory: Negative for cough, hemoptysis, sputum production, shortness of breath, wheezing and stridor.   Cardiovascular: Positive for leg swelling. Negative for chest pain, palpitations, orthopnea, claudication and PND.       Edema is not apparent today since Furosemide 69m daily  Gastrointestinal: Positive for heartburn. Negative for nausea, abdominal pain, diarrhea, constipation and blood in stool.       On and off abd/upigastric area discomfort-better after stopped orange juice.  C/o BM with urination for a while, but not worse.   Genitourinary: Positive for frequency. Negative for dysuria, urgency, hematuria and flank pain.       Cannot make  it to commode sometimes. Kegel exercise and adult depends. S/p Urology consultation summer 2015-no new recommendations. Better since UTI treated.   Musculoskeletal: Positive for joint pain. Negative for back pain, falls, myalgias and neck pain.       Left knee pain. Left mid/lower back pain-resolved-urine culture negative for UTI.   Skin: Positive for itching. Negative for rash.       Chronic and mild.  Neurological: Positive for speech change and focal weakness. Negative for dizziness, tingling, tremors, sensory change, seizures, loss of consciousness, weakness and headaches.       Mild R facial droop and right sided weakness. Grip strength 5/5-but weaker than the left. Unsteady gait. Aphasia is better.   Endo/Heme/Allergies: Positive for environmental allergies. Negative for polydipsia. Does not bruise/bleed easily.  Psychiatric/Behavioral: Positive for depression and memory loss. Negative for suicidal ideas, hallucinations and substance abuse. The patient is nervous/anxious and has insomnia.        Sleeps better     Past Medical History  Diagnosis Date  . Hypertension   . Coronary artery disease   . Arthritis   . Cataract   . Anxiety   . Blood transfusion without reported diagnosis   . Hyperlipidemia   . Heart murmur   . Allergy   . Thyroid disease   . PAF (paroxysmal atrial fibrillation)     coumadin  . History of abdominal aortic aneurysm   . History of echocardiogram 12/2008    EF >55%; mild concentric LVH; mild MR; mild-mod TR; mild AV regurg;   . History of nuclear stress test 12/2010    lexiscan; low risk; compared to prior study, perfusion improved  . History of Doppler ultrasound 05/22/2012    LEAs; R anterior tibial artery appeared occluded; L posterior tibial shows short segment of occlusive ds  . History of Doppler ultrasound 05/22/2012    Abdominal Aortic Doppler; slight increase in fusiform aneurysm   . Stroke   . Adjustment disorder with mixed anxiety and depressed  mood 04/18/2013    Post CVA depression and anxiety   . Abnormality of gait 04/18/2013  . 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 Increased exposure of the right cornea.   . Pruritic condition 01/24/2013  . Memory change 01/24/2013  . Abnormal PFTs 10/30/2012    Followed in Pulmonary clinic/ [facility]/ [place]  - PFT's 10/22/12 VC  55% no obst, DLCO 50%  - PFT's 12/20/2012 VC 83% and no obst, dLCO 55%  -11/08/2012  Walked RA x 3 laps @ 185 ft each stopped due to  End of study, not desat -11/08/12 esr 10    . Impotence of organic origin 10/03/2000  . Osteoarthrosis, unspecified whether generalized or localized, unspecified site 02/05/2003  . Hypertrophy of prostate with urinary obstruction and other lower urinary tract symptoms (LUTS)  04/28/2004  . Gout, unspecified 11/03/2004  . Acute on chronic systolic congestive heart failure, NYHA class 2 03/16/2006    BNP 376.9 05/28/13   . Atherosclerosis of renal artery 10/18/2006  . Unspecified vitamin D deficiency 10/18/2006  . Obstructive sleep apnea (adult) (pediatric) 07/23/2008  . Major depressive disorder, single episode, unspecified 03/05/2009  . Basal cell carcinoma of skin of other and unspecified parts of face 12/24/2009  . Internal hemorrhoids without mention of complication 99/24/2683  . Muscle weakness (generalized) 03/10/2011  . Pain in joint, lower leg 08/04/2011  . Nocturia 10/27/2011  . Aortic aneurysm of unspecified site without mention of rupture 10/27/2011  . Fall 03/04/2011  . Hyponatremia 03/04/2011  . TIA (transient ischemic attack) 04/30/2013  . Fatigue 04/18/2013  . 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 CVA - Rt brain stroke 12/14 02/18/2013    02/19/13 angiography CT head:  Diffuse atherosclerotic irregularity and plaque formation of the distal right common carotid artery and proximal internal carotid artery without significant stenosis. Plaque ulceration is  present and is a source of emboli. A small right thalamic CVA    . Dyslipidemia 11/03/2004  . Type II or unspecified type diabetes mellitus without mention of complication, not stated as uncontrolled 11/24/2004  . Spinal stenosis in cervical region 12/15/2004  . Right bundle branch block 04/19/2006  . Cardiac pacemaker in situ- MDT 10/11 03/18/2010    Medtronic revo implant in October 2011. Severe sinus bradycardia in the 30s, but not truly pacemaker dependent   . Long term (current) use of anticoagulants 03/17/2011  . Hypothyroidism 03/04/2011  . HTN (hypertension) 03/04/2011  . Atrial fibrillation 03/04/2011     He was on coumadin until his stroke last December and was switched to apixaban 2.63m daily which he has been taking faithfully without reported side effects.     Past Surgical History  Procedure Laterality Date  . Coronary artery bypass graft  1989  . Pacemaker insertion  2011  . Insert / replace / remove pacemaker    . Eye surgery    . Joint replacement    . Hernia repair    . Coronary angioplasty with stent placement  2008    stent to SVG to OM   Social History:   reports that he quit smoking about 46 years ago. His smoking use included Cigarettes. He has a 11.25 pack-year smoking history. He has never used smokeless tobacco. He reports that he does not drink alcohol or use illicit drugs.  Family History  Problem Relation Age of Onset  . Diabetes Father   . Other Mother     PAD with amputations    Medications: Patient's Medications  New Prescriptions   No medications on file  Previous Medications   ACETAMINOPHEN (TYLENOL) 325 MG TABLET    Take 2 tablets (650 mg total) by mouth 2 (two) times daily. DO NOT EXCEED 4 TABLETS DAILY   ALLOPURINOL (ZYLOPRIM) 100 MG TABLET    Take 50 mg by mouth daily.    AMOXICILLIN (AMOXIL) 500 MG CAPSULE    Take 500 mg by mouth. Take 4 caps prior to dential   APIXABAN (ELIQUIS) 2.5 MG TABS TABLET    Take 1 tablet (2.5 mg total) by mouth 2  (two) times daily.   CHOLECALCIFEROL (VITAMIN D) 400 UNITS CAPSULE    Take 400 Units by mouth 2 (two) times daily.    FUROSEMIDE (LASIX) 80 MG TABLET  Take 40 mg by mouth daily.    IPRATROPIUM (ATROVENT) 0.06 % NASAL SPRAY    One puff in each nostril before each meal   LEVOTHYROXINE (SYNTHROID, LEVOTHROID) 100 MCG TABLET    Take 100 mcg by mouth daily before breakfast.   LORAZEPAM (ATIVAN) 0.5 MG TABLET    Take 0.5 mg by mouth. Take one tablet at bedtime for anxiety   LOSARTAN (COZAAR) 100 MG TABLET    Take 1 tablet (100 mg total) by mouth daily.   METOPROLOL TARTRATE (LOPRESSOR) 25 MG TABLET    Take 3 tablets (75 mg total) by mouth 2 (two) times daily.   NITROGLYCERIN (NITROSTAT) 0.4 MG SL TABLET    Place 0.4 mg under the tongue every 5 (five) minutes as needed for chest pain.    OLOPATADINE (PATANOL) 0.1 % OPHTHALMIC SOLUTION    One drop in each eye twice daily to help conjunctival irritation   POLYVINYL ALCOHOL (LIQUIFILM TEARS) 1.4 % OPHTHALMIC SOLUTION    Place 1 drop into both eyes. One drop in both eyes twice daily as needed for dry eyes   POTASSIUM CHLORIDE (MICRO-K) 10 MEQ CR CAPSULE    Take 10 mEq by mouth 2 (two) times daily.   ZOLPIDEM (AMBIEN) 10 MG TABLET    Take 5 mg by mouth at bedtime as needed for sleep.   Modified Medications   No medications on file  Discontinued Medications   No medications on file   Physical Exam: Physical Exam  Nursing note and vitals reviewed. Constitutional: He is oriented to person, place, and time. He appears well-developed and well-nourished. No distress.  HENT:  Severe hearing loss. Bilateral aides.Right facial droop.   Eyes:  Corrective lenses. Increased exposure of the right cornea due to post CVA facial droop.  Neck: No JVD present. No tracheal deviation present. No thyromegaly present.  Cardiovascular: Normal rate.  Frequent extrasystoles are present.  Murmur heard. Pacemaker left upper chest. Systolic murmur 7-8/6  Pulmonary/Chest:  Effort normal. He has no wheezes. He has rales.  dry rales posterior lung bases.   Abdominal: Soft. Bowel sounds are normal. He exhibits no distension and no mass. There is no tenderness.  Musculoskeletal: Normal range of motion. He exhibits edema. He exhibits no tenderness.  Unstable gait. Using 4 wheel walker and w/c. Weaker on the right side. Hx of the right total knee replacement. Edema is not apparent today.   Lymphadenopathy:    He has no cervical adenopathy.  Neurological: He is alert and oriented to person, place, and time. He displays normal reflexes. No cranial nerve deficit. He exhibits normal muscle tone. Coordination normal.  Slurred speech. Right side weakness.   Skin: Skin is warm and dry. No rash noted. No erythema. No pallor.  Psychiatric: He has a normal mood and affect. His behavior is normal. Judgment and thought content normal.   Filed Vitals:   12/26/13 1501  BP: 124/82  Pulse: 67  Temp: 97.4 F (36.3 C)  TempSrc: Tympanic  Resp: 18   Labs reviewed: Basic Metabolic Panel:  Recent Labs  02/22/13 0943  03/27/13 1636 04/30/13 1424 04/30/13 1425 05/14/13 05/16/13  08/29/13 09/17/13 12/23/13  NA 130*  < > 134* 141  --  139  --   < > 137 138 131*  K 4.0  < > 4.7 4.2  --  4.5  --   < > 4.6 4.4 4.3  CL 97  --  99 104  --   --   --   --   --   --   --  CO2 23  --  25 24  --   --   --   --   --   --   --   GLUCOSE 131*  --  107* 111*  --   --   --   --   --   --   --   BUN 14  < > 25* 28*  --  19  --   < > 23* 20 19  CREATININE 0.74  < > 1.18 1.11  --  1.1  --   < > 1.1 1.0 0.8  CALCIUM 8.7  --  9.7 9.0  --   --   --   --   --   --   --   MG  --   --  2.3  --  2.2  --   --   --   --   --   --   TSH  --   --  6.812*  --   --  3.68 3.15  --  0.62  --   --   < > = values in this interval not displayed. Liver Function Tests:  Recent Labs  02/18/13 2025 03/27/13 1636 04/30/13 1424 05/14/13  AST _0 ALT _1 ALKPHOS 90 81 81 86  BILITOT 0.7  1.1 0.7  --   PROT 6.7 6.8 6.4  --   ALBUMIN 3.6 4.3 3.3*  --    CBC:  Recent Labs  02/18/13 2025  03/27/13 1636 04/30/13 1424 10/29/13 12/23/13  WBC 6.9  --  9.0 6.9 6.8 12.2  NEUTROABS 3.7  --   --  4.5  --   --   HGB 15.5  < > 16.5 15.7 15.6 15.0  HCT 43.1  < > 47.2 44.6 45 43  MCV 92.3  --  93.7 92.9  --   --   PLT 217  --  241 207 252 174  < > = values in this interval not displayed. Lipid Panel:  Recent Labs  02/19/13 0355 03/26/13 05/01/13 0528  CHOL 187 89 117  HDL 34* 31* 30*  LDLCALC 117* 47 71  TRIG 181* 54 79  CHOLHDL 5.5  --  3.9   Past Procedures:   04/30/2013   CLINICAL DATA:  Stroke.  Ex-smoker.  EXAM: CHEST  2 VIEW  .  IMPRESSION: Minimal bibasilar linear atelectasis.     04/30/2013   CLINICAL DATA:  TRANSIENT ISCHEMIC ATTACK  EXAM: CT HEAD WITHOUT CONTRAST  IMPRESSION: Findings consistent with an area of subacute to chronic lacunar infarction left MCA distribution. Involutional and chronic changes also appreciated. There is no evidence of acute abnormalities.     05/01/2013    EXAM: MRI HEAD WITHOUT CONTRAST  MRA HEAD WITHOUT CONTRAST    IMPRESSION: 1. Small acute/subacute infarct left corona radiata. No mass effect or hemorrhage. 2. Three superimposed punctate acute lacunar infarcts (right caudate nucleus, left occipital pole, left left parietal lobe). These could reflect synchronous small vessel ischemia rather than a recent embolic event. 3. Underlying chronic small vessel ischemia. 4. Intracranial atherosclerosis. Fusiform aneurysmal enlargement of the left ICA (cavernous segment) and right ICA terminus. No anterior circulation major branch occlusion. 5. Atherosclerosis in stenosis of the distal right vertebral artery which appears hemodynamically significant. Other posterior circulation atherosclerosis without stenosis.     Mr Jodene Nam Head/brain Wo Cm  05/01/2013  EXAM: MRI HEAD WITHOUT CONTRAST  MRA HEAD WITHOUT CONTRAST  IMPRESSION: 1. Small acute/subacute  infarct left corona radiata. No mass effect or hemorrhage. 2. Three superimposed punctate acute lacunar infarcts (right caudate nucleus, left occipital pole, left left parietal lobe). These could reflect synchronous small vessel ischemia rather than a recent embolic event. 3. Underlying chronic small vessel ischemia. 4. Intracranial atherosclerosis. Fusiform aneurysmal enlargement of the left ICA (cavernous segment) and right ICA terminus. No anterior circulation major branch occlusion. 5. Atherosclerosis in stenosis of the distal right vertebral artery which appears hemodynamically significant. Other posterior circulation atherosclerosis without stenosis.     EKG Atrial fibrillation Right bundle branch block ST depr, consider ischemia, anterolateral lds Baseline wander in lead(s) II III aVR aVL aVF V3   Assessment/Plan Hyponatremia 12/23/13 Na 131, likely due to decreased oral intake related to onset of UTI in setting of chronic Furosemide use-will encourage oral intake, update BMP   UTI (urinary tract infection) 12/25/13 urine culture: E Coli >100,000c/ml, complete Cipro 537m bid x 7days. No further hematuria since initial x1.    Type 2 diabetes mellitus with circulatory disorder 08/29/13 Hgb A1c 6.5 S/p CVA Diet controlled.    Long term current use of anticoagulant therapy Eliquis 2.526mbid.     Acute on chronic systolic congestive heart failure, NYHA class 2 BNP 376.9 05/28/13 08/29/13 BNP 284.1 Continue Furosemide 4019maily-may hold if oral intake is limited.    Adjustment disorder with mixed anxiety and depressed mood Takes Lorazepam nightly and prn Ambien for rest at night.     Atrial fibrillation Rate controlled, takes Metoprolol 19m57md.    HTN (hypertension) Metoprolol 75 mg twice a day and Losartan 100mg59mly and Furosemide 80mg 50my-controlled    Gout No flare ups. Takes Allopurinol 50mg d65m.     GERD (gastroesophageal reflux disease) C/o on and  off abd discomfort-he attributed to his orange juice consumption-stopped now-observe-better-may consider PPI if no better.    Hypothyroidism takes Levothyroxine 19mcg, 60m0.617 08/29/13. Update TSH     Enlarged prostate with lower urinary tract symptoms (LUTS) Manage symptoms-s/p Urology consultation-no change since 08-2013    Family/ Staff Communication: observe the patient.   Goals of Care: AL  Labs/tests ordered: BMP and TSH

## 2013-12-26 NOTE — Assessment & Plan Note (Signed)
C/o on and off abd discomfort-he attributed to his orange juice consumption-stopped now-observe-better-may consider PPI if no better.

## 2013-12-26 NOTE — Assessment & Plan Note (Signed)
takes Levothyroxine 48mcg, TSH 0.617 08/29/13. Update TSH

## 2013-12-26 NOTE — Assessment & Plan Note (Signed)
Metoprolol 75 mg twice a day and Losartan 100mg daily and Furosemide 80mg daily-controlled  

## 2013-12-26 NOTE — Assessment & Plan Note (Signed)
Manage symptoms-s/p Urology consultation-no change since 08-2013

## 2013-12-26 NOTE — Assessment & Plan Note (Signed)
Eliquis 2.5 mg bid

## 2013-12-26 NOTE — Assessment & Plan Note (Signed)
12/23/13 Na 131, likely due to decreased oral intake related to onset of UTI in setting of chronic Furosemide use-will encourage oral intake, update BMP

## 2013-12-31 DIAGNOSIS — E039 Hypothyroidism, unspecified: Secondary | ICD-10-CM | POA: Diagnosis not present

## 2013-12-31 DIAGNOSIS — E871 Hypo-osmolality and hyponatremia: Secondary | ICD-10-CM | POA: Diagnosis not present

## 2013-12-31 LAB — BASIC METABOLIC PANEL
BUN: 19 mg/dL (ref 4–21)
Creatinine: 1 mg/dL (ref 0.6–1.3)
GLUCOSE: 111 mg/dL
POTASSIUM: 4.4 mmol/L (ref 3.4–5.3)
Sodium: 136 mmol/L — AB (ref 137–147)

## 2013-12-31 LAB — TSH: TSH: 2.1 u[IU]/mL (ref 0.41–5.90)

## 2014-01-02 ENCOUNTER — Other Ambulatory Visit: Payer: Self-pay | Admitting: Nurse Practitioner

## 2014-01-02 DIAGNOSIS — E039 Hypothyroidism, unspecified: Secondary | ICD-10-CM

## 2014-01-02 DIAGNOSIS — E1159 Type 2 diabetes mellitus with other circulatory complications: Secondary | ICD-10-CM

## 2014-01-02 DIAGNOSIS — E871 Hypo-osmolality and hyponatremia: Secondary | ICD-10-CM

## 2014-01-06 ENCOUNTER — Other Ambulatory Visit: Payer: Self-pay | Admitting: *Deleted

## 2014-01-06 ENCOUNTER — Encounter: Payer: Self-pay | Admitting: Neurology

## 2014-01-06 ENCOUNTER — Ambulatory Visit (INDEPENDENT_AMBULATORY_CARE_PROVIDER_SITE_OTHER): Payer: Medicare Other | Admitting: Neurology

## 2014-01-06 VITALS — BP 126/70 | HR 69 | Ht 68.0 in | Wt 157.8 lb

## 2014-01-06 DIAGNOSIS — I482 Chronic atrial fibrillation, unspecified: Secondary | ICD-10-CM

## 2014-01-06 DIAGNOSIS — I2510 Atherosclerotic heart disease of native coronary artery without angina pectoris: Secondary | ICD-10-CM

## 2014-01-06 DIAGNOSIS — R350 Frequency of micturition: Secondary | ICD-10-CM | POA: Diagnosis not present

## 2014-01-06 DIAGNOSIS — Z950 Presence of cardiac pacemaker: Secondary | ICD-10-CM

## 2014-01-06 DIAGNOSIS — I6340 Cerebral infarction due to embolism of unspecified cerebral artery: Secondary | ICD-10-CM | POA: Diagnosis not present

## 2014-01-06 MED ORDER — LORAZEPAM 0.5 MG PO TABS: ORAL_TABLET | ORAL | Status: DC

## 2014-01-06 NOTE — Telephone Encounter (Signed)
[facility] [employer]

## 2014-01-06 NOTE — Progress Notes (Signed)
STROKE NEUROLOGY FOLLOW UP NOTE  NAME: Jon [name] DOB: Feb 14, 1922  REASON FOR VISIT: stroke follow up HISTORY FROM: pt and daughter and chart  Today we had the pleasure of seeing Jon [name] in follow-up at our Neurology Clinic. Pt was accompanied by daughter.   History Summary Jon [name] is a 80 y.o. right handed white male with a PMH significant for HTN, HLD, CAD s/p CABG, paroxysmal afib s/p pacemaker, s/p right CEA, recurrent ischemic strokes and AAA followed up in clinic for stroke. He had stroke in Dec 2014 with dysarthria and right face droop. Prior to the stroke, pt was on coumadin, and INR was 2.02 at that time. Nonetheless, he was switched to eliquis 2.5mg  bid for stroke prevention.   In 04/2013, pt was again admitted due to acute onset RLE weakness. He said that he lost control of that leg for approximately 30 minutes. Denies weakness of the right arm, HA, vertigo, double vision, difficulty swallowing, confusion, or visual disturbances. He talked to his daughters and was advised to come to the ED. He stated that has been taking faithfully without reported side effects. CT brain upon arrival to the ED showed findings consistent with an area of subacute to chronic lacunar infarction left MCA distribution and a subsequent MRI-DWI demonstrated: " small acute/subacute infarct left corona radiata. No mass effect or hemorrhage. 2. Three superimposed punctate acute lacunar infarcts (right caudate  nucleus, left occipital pole, left left parietal lobe). These could reflect synchronous small vessel ischemia rather than a recent embolic event". MRA brain showed intracranial atherosclerosis and fusiform enlargement of the cavernous left ICA up to 9 mm diameter as well as fusiform enlargement of the right ICA terminus, up to 6 mm diameter. Total cholesterol 117, triglycerides 79, HDL 30, LDL 71. Eliquis was increased to 5 mg twice daily. Metoprolol was increased from 50 mg twice a  day to 75 mg twice a day for better blood pressure control. Patient had resolution of his symptoms. He was discharged back to ALF.  10/01/13 follow up - the patient has been doing well. He stated that he did not have another episode of weakness, numbness, however, he still has some dysarthria and slurry speech. He continued to take eliquis 5mg  bid and no missing doses. He also complained that he is on lasix and had urinary and bowel urgency. If he did not go to bathroom quick, he will have accident.   Interval History During the interval time, he was doing the same. However, he had epistaxis in 10/2013 and as per daughter it was bad bleeding, "horrible". He went to see cardiologist Dr. Debara Pickett and was felt risk more than benefit for him to be on eliquis 5mg  bid and his eliquis was decreased to 2.5mg  bid. He was also seen recently by Man Mast, NP for his UTI with E. Coli in culture. Pt stated that he had one day of hematuria (flank blood) during the UTI. He also complained of urinary urgency after taking lasix 40mg  in the morning. Daughter requested if the lasix can be divided to 20mg  bid.   REVIEW OF SYSTEMS: Full 14 system review of systems performed and notable only for those listed below and in HPI above, all others are negative:  Constitutional: [name]  Cardiovascular: leg swelling  Ear/Nose/Throat: hearing loss, running nose, drooling  Skin: itching  Eyes: eye itching, light sensitivity  Respiratory: [name]  Gastroitestinal: urinary urgency and frequent  Hematology/Lymphatic: bruising easily  Endocrine: cold/heat intolerance  Musculoskeletal: joint pain, walking difficulty  Allergy/Immunology: [name]  Neurological: slurry speech, memory loss, facial drooping  Psychiatric: agitation, anxious  The following represents the patient's updated allergies and side effects list: Allergies  Allergen Reactions  . Caduet [Amlodipine-Atorvastatin] Other (See Comments)    Weakness   . Crestor [Rosuvastatin]  Other (See Comments)    Muscle pain/weakness  . Cymbalta [Duloxetine Hcl] Other (See Comments)    Excessive sedation  . Lipitor [Atorvastatin Calcium] Other (See Comments)    Muscle pain/weakness  . Lisinopril Other (See Comments)    Doesn't remember reaction  . Simvastatin Other (See Comments)    Muscle pain/weakness    Labs since last visit of relevance include the following: Results for orders placed or performed in visit on 67/34/19  Basic metabolic panel  Result Value Ref Range   Glucose 111 mg/dL   BUN 19 4 - 21 mg/dL   Creatinine 1.0 0.6 - 1.3 mg/dL   Potassium 4.4 3.4 - 5.3 mmol/L   Sodium 136 (A) 137 - 147 mmol/L  TSH  Result Value Ref Range   TSH 2.10 0.41 - 5.90 uIU/mL    The neurologically relevant items on the patient's problem list were reviewed on today's visit.  Neurologic Examination  A problem focused neurological exam (12 or more points of the single system neurologic examination, vital signs counts as 1 point, cranial nerves count for 8 points) was performed.  Blood pressure 126/70, pulse 69, height 5\' 8"  (1.727 m), weight 157 lb 12.8 oz (71.578 kg).  General - Well nourished, well developed, in no apparent distress.  Ophthalmologic - not able to see throught.  Cardiovascular - irregularly irregular heart rate and rhythm.  Mental Status -  Level of arousal and orientation to time, place, and person were intact. Language including expression, naming, repetition, comprehension, reading was assessed and found intact. Mild dysarthria.  Cranial Nerves II - XII - II - Vision intact OU. III, IV, VI - Extraocular movements intact. V - Facial sensation intact bilaterally. VII - Facial movement intact bilaterally. VIII - Hearing & vestibular intact bilaterally. X - Palate elevates symmetrically, mild dysarthria. XI - Chin turning & shoulder shrug intact bilaterally. XII - Tongue protrusion intact.  Motor Strength - The patient's strength was 4+/5 in all  extremities and pronator drift was absent.  Bulk was normal and fasciculations were absent.   Motor Tone - Muscle tone was assessed at the neck and appendages and was normal.  Reflexes - The patient's reflexes were 1+ in all extremities and he had no pathological reflexes. No rigidity.  Sensory - Light touch, temperature/pinprick were assessed and were normal.    Coordination - The patient had normal movements in the hands with no ataxia or dysmetria.  Tremor was absent.  Gait and Station - walk with walker, slow but no hemiparetic gait.  Data reviewed: I personally reviewed the images and agree with the radiology interpretations.  MRI and MRA 05/01/13 1. Small acute/subacute infarct left corona radiata. No mass effect or hemorrhage. 2. Three superimposed punctate acute lacunar infarcts (right caudate nucleus, left occipital pole, left left parietal lobe). These could reflect synchronous small vessel ischemia rather than a recent embolic event. 3. Underlying chronic small vessel ischemia. 4. Intracranial atherosclerosis. Fusiform aneurysmal enlargement of the left ICA (cavernous segment) and right ICA terminus. No anterior circulation major branch occlusion. 5. Atherosclerosis in stenosis of the distal right vertebral artery which appears hemodynamically significant. Other posterior circulation atherosclerosis without stenosis. CT 04/2013 Findings  consistent with an area of subacute to chronic lacunar infarction left MCA distribution. Involutional and chronic changes also appreciated. There is no evidence of acute abnormalities. Diffuse atherosclerotic disease  CTA head 02/2013 Moderate stenosis of the right cavernous carotid with heavily calcified plaque. There is fusiform dilatation of the right supra clinoid internal carotid artery compatible with atherosclerotic disease. Mild to moderate atherosclerotic stenosis of the left cavernous carotid.  Calcified plaque with mild to moderate  stenosis distal vertebral artery bilaterally Diffuse atherosclerotic irregularity and plaque formation of the distal right common carotid artery and proximal internal carotid artery without significant stenosis. Plaque ulceration is present and is a source of emboli. Atherosclerotic disease in the left carotid bifurcation without hemodynamically significant stenosis. 2D ehco 02/2013 - Left ventricle: The cavity size was normal. Wall thickness   was increased in a pattern of moderate LVH. There was   focal basal hypertrophy. Systolic function was mildly to   moderately reduced. The estimated ejection fraction was in   the range of 40% to 45%. - Aortic valve: Mild regurgitation. Valve area: 2.29cm^2   (Vmax). - Left atrium: The atrium was moderately dilated. - Right ventricle: The cavity size was mildly dilated. - Right atrium: The atrium was mildly dilated. CUS 02/2013 - Findings consistent with1- 39 percent stenosis involving the right internal carotid artery and the left internal carotid artery. - The right vertebral demonstrates atypical waveforms.   Possible proximal obstruction.   The left vertebral flow is antegrade. - ICA/CCA Ratio. right = 0.33. left= 0.84  Component     Latest Ref Rng 05/01/2013 05/14/2013 05/16/2013 05/28/2013  Cholesterol     0 - 200 mg/dL 117     Triglycerides     <150 mg/dL 79     HDL     >39 mg/dL 30 (L)     Total CHOL/HDL Ratio      3.9     VLDL     0 - 40 mg/dL 16     LDL (calc)     0 - 99 mg/dL 71     Glucose         101  BUN     4 - 21 mg/dL    25 (A)  Creatinine     0.6 - 1.3 mg/dL    1.1  Potassium     3.4 - 5.3 mmol/L    4.0  Sodium     137 - 147 mmol/L    140  Hemoglobin A1C     4.0 - 6.0 % 6.3 (H)     Mean Plasma Glucose     <117 mg/dL 134 (H)     TSH     0.41 - 5.90 uIU/mL  3.68 3.15    Component     Latest Ref Rng 08/29/2013 09/17/2013  Cholesterol     0 - 200 mg/dL    Triglycerides     <150 mg/dL    HDL     >39 mg/dL      Total CHOL/HDL Ratio         VLDL     0 - 40 mg/dL    LDL (calc)     0 - 99 mg/dL    Glucose       110  BUN     4 - 21 mg/dL  20  Creatinine     0.6 - 1.3 mg/dL  1.0  Potassium     3.4 - 5.3 mmol/L  4.4  Sodium  137 - 147 mmol/L  138  Hemoglobin A1C     4.0 - 6.0 % 6.5 (A)   Mean Plasma Glucose     <117 mg/dL    TSH     0.41 - 5.90 uIU/mL 0.62    Component     Latest Ref Rng 12/31/2013  Glucose      111  BUN     4 - 21 mg/dL 19  Creatinine     0.6 - 1.3 mg/dL 1.0  Potassium     3.4 - 5.3 mmol/L 4.4  Sodium     137 - 147 mmol/L 136 (A)  TSH     0.41 - 5.90 uIU/mL 2.10   Assessment: As you may recall, he is a 80 y.o. Caucasian male with PMH significant for HTN, HLD, CAD s/p CABG, paroxysmal afib s/p pacemaker, s/p right CEA, recurrent ischemic strokes and AAA followed up in clinic for stroke. He had first stroke in 02/2013, changed from coumadin to eliquis but on low dose. Had residue of slurry speech. In 04/2013 he had another stroke complaining of RLE weakness for 61min and resolved. MRI showed multiple punctate or small infarcts bilatearlly. He was put on eliquis 5mg  bid. However, he had epistaxis and hematuria during the interval time. Cardiologist Dr. Debara Pickett changed him back to 2.5mg  bid. He also complains of urinary urgency after 40mg  lasix in the morning..  Plan:  - continue coumadin 2.5mg  twice a day. Although 5mg  bid is the appropriate dose for him, he had episode of severe epistaxis and hematuria during UTI, Dr. Debara Pickett put him back to 2.5mg  bid due to risk overweigh benefit. I discussed with pt and daughter, they agree with continuing 2.5mg  bid to avoid bleeding risk with knowing that low dose may not give him full protection for afib related embolic stroke. - would recommend to try lasix 20mg  at 8am and 2pm to avoid significant urinary urgency and to avoid frequent urination at the night. - continue other home meds. - follow up with PCP for risk factor control.   - RTC in 3 months.  Rosalin Hawking, MD PhD [facility] [address] [place], [place] [address] [phone]

## 2014-01-14 DIAGNOSIS — E119 Type 2 diabetes mellitus without complications: Secondary | ICD-10-CM | POA: Diagnosis not present

## 2014-01-14 DIAGNOSIS — L84 Corns and callosities: Secondary | ICD-10-CM | POA: Diagnosis not present

## 2014-01-14 DIAGNOSIS — B351 Tinea unguium: Secondary | ICD-10-CM | POA: Diagnosis not present

## 2014-01-15 ENCOUNTER — Telehealth: Payer: Self-pay | Admitting: Cardiovascular Disease

## 2014-01-15 NOTE — Telephone Encounter (Signed)
Closed encounter °

## 2014-02-13 ENCOUNTER — Non-Acute Institutional Stay: Payer: Medicare Other | Admitting: Internal Medicine

## 2014-02-13 ENCOUNTER — Encounter: Payer: Self-pay | Admitting: Internal Medicine

## 2014-02-13 VITALS — BP 138/76 | HR 68 | Temp 97.2°F | Wt 162.0 lb

## 2014-02-13 DIAGNOSIS — I63039 Cerebral infarction due to thrombosis of unspecified carotid artery: Secondary | ICD-10-CM

## 2014-02-13 DIAGNOSIS — I482 Chronic atrial fibrillation, unspecified: Secondary | ICD-10-CM

## 2014-02-13 DIAGNOSIS — R3915 Urgency of urination: Secondary | ICD-10-CM | POA: Diagnosis not present

## 2014-02-13 DIAGNOSIS — I2510 Atherosclerotic heart disease of native coronary artery without angina pectoris: Secondary | ICD-10-CM

## 2014-02-13 DIAGNOSIS — I10 Essential (primary) hypertension: Secondary | ICD-10-CM

## 2014-02-13 DIAGNOSIS — E039 Hypothyroidism, unspecified: Secondary | ICD-10-CM | POA: Diagnosis not present

## 2014-02-13 DIAGNOSIS — I5022 Chronic systolic (congestive) heart failure: Secondary | ICD-10-CM

## 2014-02-13 DIAGNOSIS — R159 Full incontinence of feces: Secondary | ICD-10-CM

## 2014-02-13 DIAGNOSIS — E1159 Type 2 diabetes mellitus with other circulatory complications: Secondary | ICD-10-CM | POA: Diagnosis not present

## 2014-02-13 DIAGNOSIS — R609 Edema, unspecified: Secondary | ICD-10-CM

## 2014-02-13 DIAGNOSIS — L299 Pruritus, unspecified: Secondary | ICD-10-CM

## 2014-02-13 DIAGNOSIS — K649 Unspecified hemorrhoids: Secondary | ICD-10-CM

## 2014-02-13 DIAGNOSIS — R05 Cough: Secondary | ICD-10-CM | POA: Diagnosis not present

## 2014-02-13 DIAGNOSIS — R059 Cough, unspecified: Secondary | ICD-10-CM

## 2014-02-13 DIAGNOSIS — R413 Other amnesia: Secondary | ICD-10-CM

## 2014-02-13 NOTE — Progress Notes (Signed)
Patient ID: Jon [name], male   DOB: 07/10/1921, 80 y.o.   MRN: [id number]    [place] Room Number: 258  Place of Service: Clinic (12)  Allergies  Allergen Reactions  . Caduet [Amlodipine-Atorvastatin] Other (See Comments)    Weakness   . Crestor [Rosuvastatin] Other (See Comments)    Muscle pain/weakness  . Cymbalta [Duloxetine Hcl] Other (See Comments)    Excessive sedation  . Lipitor [Atorvastatin Calcium] Other (See Comments)    Muscle pain/weakness  . Lisinopril Other (See Comments)    Doesn't remember reaction  . Simvastatin Other (See Comments)    Muscle pain/weakness    Chief Complaint  Patient presents with  . Medical Management of Chronic Issues    blood pressure, thyroid, blood sugar, anxiety, depression, CHF    HPI:  Daughter asks that he not receive quinolones because an article in th [place] published 01/05/14 alleges that they can trigger rupture of the AAA and cause delirium.  Still feels like he has disturbance of speech and voice. Not as prominent when he is singing.  Cerebral infarction due to thrombosis of carotid artery: stable  Essential hypertension: controlled  Hypothyroidism, unspecified hypothyroidism type: controllled  Edema; improved  Cough: persistent and likely related to recurrent aspiration.  Type 2 diabetes mellitus with other circulatory complications: controlled  Chronic atrial fibrillation: rate controlled  Urgency of urination: persistent  Hemorrhoids, unspecified hemorrhoid type: occasional light bleeding  Fecal incontinence: present < 6 months. Unaware of when he stools. No blood in stool or pain with BM.  Pruritus: Using TCA cream.Would like to return to TCA and Eucerin 50/50    Medications: Patient's Medications  New Prescriptions   No medications on file  Previous Medications   ACETAMINOPHEN (TYLENOL) 325 MG TABLET    Take 2 tablets (650 mg total) by mouth 2 (two)  times daily. DO NOT EXCEED 4 TABLETS DAILY   ALLOPURINOL (ZYLOPRIM) 100 MG TABLET    Take 50 mg by mouth daily.    AMOXICILLIN (AMOXIL) 500 MG CAPSULE    Take 500 mg by mouth. Take 4 caps prior to dential   APIXABAN (ELIQUIS) 2.5 MG TABS TABLET    Take 1 tablet (2.5 mg total) by mouth 2 (two) times daily.   CHOLECALCIFEROL (VITAMIN D) 400 UNITS CAPSULE    Take 400 Units by mouth 2 (two) times daily.    FUROSEMIDE (LASIX) 80 MG TABLET    Take 40 mg by mouth daily.    IPRATROPIUM (ATROVENT) 0.06 % NASAL SPRAY    One puff in each nostril before each meal   LEVOTHYROXINE (SYNTHROID, LEVOTHROID) 100 MCG TABLET    Take 100 mcg by mouth daily before breakfast.   LORAZEPAM (ATIVAN) 0.5 MG TABLET    Take one tablet by mouth at bedtime   LOSARTAN (COZAAR) 100 MG TABLET    Take 1 tablet (100 mg total) by mouth daily.   METOPROLOL TARTRATE (LOPRESSOR) 25 MG TABLET    Take 3 tablets (75 mg total) by mouth 2 (two) times daily.   NITROGLYCERIN (NITROSTAT) 0.4 MG SL TABLET    Place 0.4 mg under the tongue every 5 (five) minutes as needed for chest pain.    OLOPATADINE (PATANOL) 0.1 % OPHTHALMIC SOLUTION    One drop in each eye twice daily to help conjunctival irritation   POLYVINYL ALCOHOL (LIQUIFILM TEARS) 1.4 % OPHTHALMIC SOLUTION    Place 1 drop into both eyes. One drop in both eyes  twice daily as needed for dry eyes   POTASSIUM CHLORIDE (MICRO-K) 10 MEQ CR CAPSULE    Take 10 mEq by mouth 2 (two) times daily.   ZOLPIDEM (AMBIEN) 10 MG TABLET    Take 5 mg by mouth at bedtime as needed for sleep.   Modified Medications   No medications on file  Discontinued Medications   No medications on file     Review of Systems  Constitutional: Negative.   HENT: Positive for hearing loss and voice change (dysphonia since his stroke.). Negative for congestion, ear discharge, ear pain, rhinorrhea, sinus pressure and tinnitus.   Eyes: Negative.   Respiratory: Negative for cough, choking, chest tightness, shortness of  breath and wheezing.   Cardiovascular: Negative for palpitations and leg swelling (sometimes, not apparent today. ).  Gastrointestinal: Negative for abdominal pain, diarrhea and abdominal distention.       Chokes on swallowing since his CVA. Episodes of fecal incontinence. Hx of hemorrhoids.  Endocrine:       Hypothyroid  Genitourinary: Positive for frequency.  Musculoskeletal: Positive for arthralgias and gait problem. Negative for neck pain and neck stiffness.  Skin: Negative.  Negative for pallor and rash.  Allergic/Immunologic: Negative.   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 Recurrent CVA on 04/30/13 of the left MCA area  Hematological: Negative.   Psychiatric/Behavioral: Negative.     Filed Vitals:   02/13/14 1600  BP: 138/76  Pulse: 68  Temp: 97.2 F (36.2 C)  TempSrc: Oral  Weight: 162 lb (73.483 kg)  SpO2: 97%   Body mass index is 24.64 kg/(m^2).  Physical Exam  Constitutional: He is oriented to person, place, and time. He appears well-developed and well-nourished. No distress.  HENT:  Severe hearing loss. Bilateral aides.Right facial droop.   Eyes:  Corrective lenses. Increased exposure of the right cornea due to post CVA facial droop.  Neck: No JVD present. No tracheal deviation present. No thyromegaly present.  Cardiovascular: Normal rate and normal heart sounds.  Frequent extrasystoles are present.  No murmur heard. Pacemaker left upper chest  Pulmonary/Chest: Effort normal. He has no wheezes. He has rales.  dry rales posterior lung bases.   Abdominal: Soft. Bowel sounds are normal. He exhibits no distension and no mass. There is no tenderness.  Musculoskeletal: Normal range of motion. He exhibits edema. He exhibits no tenderness.  Unstable gait. Using 4 wheel walker and w/c. Weaker on the right side. Hx of the right total knee replacement. Edema is not apparent today.   Lymphadenopathy:     He has no cervical adenopathy.  Neurological: He is alert and oriented to person, place, and time. He displays normal reflexes. No cranial nerve deficit. He exhibits normal muscle tone. Coordination normal.  Slurred speech. Right side weakness. C/o left foot spasm   Skin: Skin is warm and dry. No rash noted. No erythema. No pallor.  Psychiatric: He has a normal mood and affect. His behavior is normal. Judgment and thought content normal.  Nursing note and vitals reviewed.    Labs reviewed: Lab on 01/02/2014  Component Date Value Ref Range Status  . Glucose 12/31/2013 111   Final  . BUN 12/31/2013 19  4 - 21 mg/dL Final  . Creatinine 12/31/2013 1.0  0.6 - 1.3 mg/dL Final  . Potassium 12/31/2013 4.4  3.4 - 5.3 mmol/L Final  . Sodium 12/31/2013 136* 137 - 147 mmol/L Final  . TSH 12/31/2013 2.10  0.41 - 5.90 uIU/mL Final  Nursing Home on 12/26/2013  Component Date Value Ref Range Status  . Hemoglobin 12/23/2013 15.0  13.5 - 17.5 g/dL Final  . HCT 12/23/2013 43  41 - 53 % Final  . Platelets 12/23/2013 174  150 - 399 K/L Final  . WBC 12/23/2013 12.2   Final  . Glucose 12/23/2013 121   Final  . BUN 12/23/2013 19  4 - 21 mg/dL Final  . Creatinine 12/23/2013 0.8  0.6 - 1.3 mg/dL Final  . Potassium 12/23/2013 4.3  3.4 - 5.3 mmol/L Final  . Sodium 12/23/2013 131* 137 - 147 mmol/L Final  Clinical Support on 11/21/2013  Component Date Value Ref Range Status  . Date Time Interrogation Session 11/21/2013 [id number]   Final  . Pulse Generator Manufacturer 11/21/2013 [employer]   Final  . Pulse Gen Model 11/21/2013 RVDR01 Revo MRI   Final  . Pulse Gen Serial Number 11/21/2013 [id number]   Final  . RV Sense Sensitivity 11/21/2013 0.9   Final  . RA Pace Amplitude 11/21/2013 2   Final  . RV Pace PulseWidth 11/21/2013 0.4   Final  . RV Pace Amplitude 11/21/2013 2.5   Final  . Zone Setting Type Category 11/21/2013 VF   Final  . Zone Setting Type Category 11/21/2013 VT   Final  . Zone Setting  Type Category 11/21/2013 VENTRICULAR_TACHYCARDIA_1   Final  . Zone Setting Type Category 11/21/2013 VENTRICULAR_TACHYCARDIA_2   Final  . Zone Detect Interval 11/21/2013 400   Final  . Zone Setting Type Category 11/21/2013 ATRIAL_FIBRILLATION   Final  . Zone Setting Type Category 11/21/2013 ATAF   Final  . Zone Detect Interval 11/21/2013 350   Final  . RA Impedance 11/21/2013 448   Final  . RV IMPEDANCE 11/21/2013 432   Final  . Battery Status 11/21/2013 OK   Final  . Battery Voltage 11/21/2013 2.99   Final  . Loletha Grayer RA Perc Paced 11/21/2013 1.7   Final  . Loletha Grayer RV Perc Paced 11/21/2013 46.7   Final  . Loletha Grayer AP VP Percent 11/21/2013 1.16   Final  . Loletha Grayer AS VP Percent 11/21/2013 45.54   Final  . Loletha Grayer AP VS Percent 11/21/2013 0.54   Final  . Loletha Grayer AS VS Percent 11/21/2013 52.76   Final  . Eval Rhythm 11/21/2013 AFVP/VS   Final  . Miscellaneous Comment 11/21/2013    Final                   Value:Pacemaker remote check. Device function reviewed. Impedances consistent with previous measurements. Histograms appropriate for patient and level of activity. All other diagnostic data reviewed and is appropriate and stable for patient. Real time EGM                          shows appropriate sensing and capture. Persistant AF x 8 months + Eliquis. No ventricular high rate episodes. Batt voltage 2.99V (ERI 2.81V). Plan to follow up via [facility] on 12-21 and with [facility] in 04-2014.     Assessment/Plan 1. Cerebral infarction due to thrombosis of carotid artery Unchanged  2. Essential hypertension controlled  3. Hypothyroidism, unspecified hypothyroidism type compensated  4. Edema improved  5. Cough Likely related to recurrent small aspirations  6. Type 2 diabetes mellitus with other circulatory complications controlled  7. Chronic atrial fibrillation Rate controlled  8. Urgency of urination Possible UTI.  9. Hemorrhoids, unspecified hemorrhoid type Use OTC preparations  10.  Fecal  incontinence GI referral   11. Pruritic condition TCA cream mixed with Eucerin 1:2. Applt twice daily to itching areas  12. Memory change Continue to observe  13. Chronic systolic congestive heart failure controlled

## 2014-02-17 DIAGNOSIS — R159 Full incontinence of feces: Secondary | ICD-10-CM | POA: Insufficient documentation

## 2014-02-17 DIAGNOSIS — I5022 Chronic systolic (congestive) heart failure: Secondary | ICD-10-CM | POA: Insufficient documentation

## 2014-02-20 ENCOUNTER — Encounter: Payer: Self-pay | Admitting: Nurse Practitioner

## 2014-02-20 NOTE — Progress Notes (Signed)
This encounter was created in error - please disregard.

## 2014-02-24 ENCOUNTER — Ambulatory Visit (INDEPENDENT_AMBULATORY_CARE_PROVIDER_SITE_OTHER): Payer: Medicare Other | Admitting: *Deleted

## 2014-02-24 DIAGNOSIS — I482 Chronic atrial fibrillation, unspecified: Secondary | ICD-10-CM

## 2014-02-24 LAB — MDC_IDC_ENUM_SESS_TYPE_REMOTE
Battery Voltage: 2.97 V
Brady Statistic RA Percent Paced: 2.17 %
Lead Channel Impedance Value: 448 Ohm
Lead Channel Impedance Value: 448 Ohm
Lead Channel Setting Pacing Amplitude: 2 V
Lead Channel Setting Pacing Pulse Width: 0.4 ms
MDC IDC SESS DTM: 20151221160200
MDC IDC SET LEADCHNL RV PACING AMPLITUDE: 2.5 V
MDC IDC SET LEADCHNL RV SENSING SENSITIVITY: 0.9 mV
MDC IDC SET ZONE DETECTION INTERVAL: 350 ms
MDC IDC SET ZONE DETECTION INTERVAL: 400 ms
MDC IDC STAT BRADY AP VP PERCENT: 1.48 %
MDC IDC STAT BRADY AP VS PERCENT: 0.69 %
MDC IDC STAT BRADY AS VP PERCENT: 43.3 %
MDC IDC STAT BRADY AS VS PERCENT: 54.53 %
MDC IDC STAT BRADY RV PERCENT PACED: 44.78 %

## 2014-02-24 NOTE — Progress Notes (Signed)
Remote pacemaker transmission.   

## 2014-02-26 ENCOUNTER — Ambulatory Visit (INDEPENDENT_AMBULATORY_CARE_PROVIDER_SITE_OTHER): Payer: Medicare Other | Admitting: Ophthalmology

## 2014-03-10 ENCOUNTER — Encounter: Payer: Self-pay | Admitting: Cardiology

## 2014-03-14 ENCOUNTER — Ambulatory Visit (INDEPENDENT_AMBULATORY_CARE_PROVIDER_SITE_OTHER): Payer: Medicare Other | Admitting: Ophthalmology

## 2014-03-14 DIAGNOSIS — H35033 Hypertensive retinopathy, bilateral: Secondary | ICD-10-CM | POA: Diagnosis not present

## 2014-03-14 DIAGNOSIS — H338 Other retinal detachments: Secondary | ICD-10-CM | POA: Diagnosis not present

## 2014-03-14 DIAGNOSIS — H43813 Vitreous degeneration, bilateral: Secondary | ICD-10-CM | POA: Diagnosis not present

## 2014-03-20 ENCOUNTER — Encounter: Payer: Self-pay | Admitting: Cardiovascular Disease

## 2014-03-24 DIAGNOSIS — R062 Wheezing: Secondary | ICD-10-CM | POA: Diagnosis not present

## 2014-03-27 ENCOUNTER — Encounter: Payer: Self-pay | Admitting: Nurse Practitioner

## 2014-03-27 ENCOUNTER — Non-Acute Institutional Stay: Payer: Medicare Other | Admitting: Nurse Practitioner

## 2014-03-27 DIAGNOSIS — R609 Edema, unspecified: Secondary | ICD-10-CM

## 2014-03-27 DIAGNOSIS — R413 Other amnesia: Secondary | ICD-10-CM

## 2014-03-27 DIAGNOSIS — M1000 Idiopathic gout, unspecified site: Secondary | ICD-10-CM | POA: Diagnosis not present

## 2014-03-27 DIAGNOSIS — R059 Cough, unspecified: Secondary | ICD-10-CM

## 2014-03-27 DIAGNOSIS — I480 Paroxysmal atrial fibrillation: Secondary | ICD-10-CM

## 2014-03-27 DIAGNOSIS — K146 Glossodynia: Secondary | ICD-10-CM | POA: Insufficient documentation

## 2014-03-27 DIAGNOSIS — I10 Essential (primary) hypertension: Secondary | ICD-10-CM

## 2014-03-27 DIAGNOSIS — F4323 Adjustment disorder with mixed anxiety and depressed mood: Secondary | ICD-10-CM

## 2014-03-27 DIAGNOSIS — K5900 Constipation, unspecified: Secondary | ICD-10-CM

## 2014-03-27 DIAGNOSIS — R05 Cough: Secondary | ICD-10-CM | POA: Diagnosis not present

## 2014-03-27 DIAGNOSIS — E039 Hypothyroidism, unspecified: Secondary | ICD-10-CM | POA: Diagnosis not present

## 2014-03-27 DIAGNOSIS — K219 Gastro-esophageal reflux disease without esophagitis: Secondary | ICD-10-CM

## 2014-03-27 DIAGNOSIS — R040 Epistaxis: Secondary | ICD-10-CM

## 2014-03-27 NOTE — Assessment & Plan Note (Signed)
Rate controlled, takes Metoprolol 75mg  bid. Eliquis for VTE risk reduction.

## 2014-03-27 NOTE — Assessment & Plan Note (Signed)
No BM 2-3 days, will add MiraLax 17gm daily.

## 2014-03-27 NOTE — Assessment & Plan Note (Signed)
Stated he forgets peoples's names which he was used to be good at.

## 2014-03-27 NOTE — Assessment & Plan Note (Signed)
Metoprolol 75 mg twice a day and Losartan 100mg  daily and Furosemide 80mg  daily-controlled

## 2014-03-27 NOTE — Assessment & Plan Note (Signed)
Traumatic biting injury to the right side tongue(right sided weakness from previous CVA). Thick yellow coated tongue. Will try Magic Mouth Wash 72ml S/S qid for 2 weeks.

## 2014-03-27 NOTE — Assessment & Plan Note (Signed)
Post CVA depression and anxiety 02/20/14 Pharm prn Ambien not used, reduce Lorazepam 0.25mg  qhs.

## 2014-03-27 NOTE — Assessment & Plan Note (Signed)
Much improved since Furosemide 80mg  08/15/13.. Trace RLE with mild heat-better today. S/p bypass surgery.

## 2014-03-27 NOTE — Assessment & Plan Note (Signed)
Stable

## 2014-03-27 NOTE — Assessment & Plan Note (Signed)
No flare ups. Takes Allopurinol 50mg  daily.

## 2014-03-27 NOTE — Assessment & Plan Note (Signed)
takes Levothyroxine 79mcg, TSH 0.617 08/29/13 and 2.097 12/31/13

## 2014-03-27 NOTE — Assessment & Plan Note (Signed)
On and off-Afrin prn helped.

## 2014-03-27 NOTE — Assessment & Plan Note (Signed)
Chronic, seems more noticeable since off Claritin 08/15/13. Denied chest pain, SOB, paroxysmal orthopnea, small yellow phlegm production, no association with swallowing, or awakening from cough. Will start Mucinex 600mg  bid x 5 days and Z-pk.

## 2014-03-27 NOTE — Progress Notes (Signed)
Patient ID: Jon [name], male   DOB: 1921-11-25, 81 y.o.   MRN: [id number]   Code Status: DNR  Allergies  Allergen Reactions  . Caduet [Amlodipine-Atorvastatin] Other (See Comments)    Weakness   . Crestor [Rosuvastatin] Other (See Comments)    Muscle pain/weakness  . Cymbalta [Duloxetine Hcl] Other (See Comments)    Excessive sedation  . Lipitor [Atorvastatin Calcium] Other (See Comments)    Muscle pain/weakness  . Lisinopril Other (See Comments)    Doesn't remember reaction  . Quinolones     Risk of rupture of AAA  . Simvastatin Other (See Comments)    Muscle pain/weakness    Chief Complaint  Patient presents with  . Medical Management of Chronic Issues  . Acute Visit    cough    HPI: Patient is a 81 y.o. male seen in the AL at [facility] today for evaluation of nasal congestion/cough, constipation, sore tongue,  and other chronic medical conditions.    Hospitalized from 04/30/2013 to 05/03/2013 for RLE weakness which lasted about 30 minutes. TIA was considered given nature of the problem and CT brain findings/MRI and MRA brain. He was discharged with increased Eliquis and adjusting of antihypertensive medications.   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 Thick yellow coated tongue. Will try Magic Mouth Wash 74m S/S qid for 2 weeks.       Memory change    Stated he forgets peoples's names which he was used to be good at.        Hypothyroidism (Chronic)    takes Levothyroxine 782m, TSH 0.617 08/29/13 and 2.097 12/31/13      HTN (hypertension) (Chronic)    Metoprolol 75 mg twice a day and Losartan 10067maily and Furosemide 4m49mily-controlled       Gout    No flare ups. Takes Allopurinol 50mg70mly.        GERD (gastroesophageal reflux disease)    Stable.         Epistaxis    On and off-Afrin prn helped.       Edema    Much improved since Furosemide 4mg 49m/15.. Trace RLE with mild heat-better today. S/p bypass surgery.         Cough - Primary    Chronic, seems more noticeable since off Claritin 08/15/13. Denied chest pain, SOB, paroxysmal orthopnea, small yellow phlegm production, no association with swallowing, or awakening from cough. Will start Mucinex 600mg b53m 5 days and Z-pk.        Constipation    No BM 2-3 days, will add MiraLax 17gm daily.       Atrial fibrillation (Chronic)    Rate controlled, takes Metoprolol 75mg bi64mliquis for VTE risk reduction.         Adjustment disorder with mixed anxiety and depressed mood    Post CVA depression and anxiety 02/20/14 Pharm prn Ambien not used, reduce Lorazepam 0.25mg qhs110m        Review of Systems:  Review of Systems  Constitutional: Negative for fever, chills, weight loss, malaise/fatigue and diaphoresis.  HENT: Positive for congestion, hearing loss and nosebleeds. Negative for ear discharge, ear pain, sore throat and tinnitus.        Right side of tongue has traumatic wound from biting and thick while coated tongue.   Eyes: Negative for blurred  vision, double vision, photophobia, pain, discharge and redness.  Respiratory: Positive for cough and sputum production. Negative for shortness of breath, wheezing and stridor.        Small amount of yellow sputum.   Cardiovascular: Positive for leg swelling. Negative for chest pain, palpitations, orthopnea, claudication and PND.       Trace BLE  Gastrointestinal: Positive for constipation. Negative for heartburn, nausea, vomiting, abdominal pain, diarrhea, blood in stool and melena.  Genitourinary: Positive for frequency. Negative for dysuria, urgency, hematuria and flank pain.  Musculoskeletal: Negative for myalgias, back pain, joint pain, falls and neck pain.  Skin: Negative for itching and rash.  Neurological: Positive for sensory change, speech change and focal weakness. Negative  for dizziness, tingling, tremors, seizures, loss of consciousness, weakness and headaches.  Endo/Heme/Allergies: Negative for environmental allergies and polydipsia. Bruises/bleeds easily.  Psychiatric/Behavioral: Positive for depression and memory loss. Negative for suicidal ideas, hallucinations and substance abuse. The patient is nervous/anxious and has insomnia.      Past Medical History  Diagnosis Date  . Hypertension   . Coronary artery disease   . Arthritis   . Cataract   . Anxiety   . Blood transfusion without reported diagnosis   . Hyperlipidemia   . Heart murmur   . Allergy   . Thyroid disease   . PAF (paroxysmal atrial fibrillation)     coumadin  . History of abdominal aortic aneurysm   . History of echocardiogram 12/2008    EF >55%; mild concentric LVH; mild MR; mild-mod TR; mild AV regurg;   . History of nuclear stress test 12/2010    lexiscan; low risk; compared to prior study, perfusion improved  . History of Doppler ultrasound 05/22/2012    LEAs; R anterior tibial artery appeared occluded; L posterior tibial shows short segment of occlusive ds  . History of Doppler ultrasound 05/22/2012    Abdominal Aortic Doppler; slight increase in fusiform aneurysm   . Stroke   . Adjustment disorder with mixed anxiety and depressed mood 04/18/2013    Post CVA depression and anxiety   . Abnormality of gait 04/18/2013  . 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 Increased exposure of the right cornea.   . Pruritic condition 01/24/2013  . Memory change 01/24/2013  . Abnormal PFTs 10/30/2012    Followed in Pulmonary clinic/ [facility]/ [place]  - PFT's 10/22/12 VC  55% no obst, DLCO 50%  - PFT's 12/20/2012 VC 83% and no obst, dLCO 55%  -11/08/2012  Walked RA x 3 laps @ 185 ft each stopped due to  End of study, not desat -11/08/12 esr 10    . Impotence of organic origin 10/03/2000  . Osteoarthrosis, unspecified whether generalized or localized, unspecified site  02/05/2003  . Hypertrophy of prostate with urinary obstruction and other lower urinary tract symptoms (LUTS) 04/28/2004  . Gout, unspecified 11/03/2004  . Acute on chronic systolic congestive heart failure, NYHA class 2 03/16/2006    BNP 376.9 05/28/13   . Atherosclerosis of renal artery 10/18/2006  . Unspecified vitamin D deficiency 10/18/2006  . Obstructive sleep apnea (adult) (pediatric) 07/23/2008  . Major depressive disorder, single episode, unspecified 03/05/2009  . Basal cell carcinoma of skin of other and unspecified parts of face 12/24/2009  . Internal hemorrhoids without mention of complication 47/65/4650  . Muscle weakness (generalized) 03/10/2011  . Pain in joint, lower leg 08/04/2011  . Nocturia 10/27/2011  . Aortic aneurysm of unspecified site without mention of rupture  10/27/2011  . Fall 03/04/2011  . Hyponatremia 03/04/2011  . TIA (transient ischemic attack) 04/30/2013  . Fatigue 04/18/2013  . 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 CVA - Rt brain stroke 12/14 02/18/2013    02/19/13 angiography CT head:  Diffuse atherosclerotic irregularity and plaque formation of the distal right common carotid artery and proximal internal carotid artery without significant stenosis. Plaque ulceration is present and is a source of emboli. A small right thalamic CVA    . Dyslipidemia 11/03/2004  . Type II or unspecified type diabetes mellitus without mention of complication, not stated as uncontrolled 11/24/2004  . Spinal stenosis in cervical region 12/15/2004  . Right bundle branch block 04/19/2006  . Cardiac pacemaker in situ- MDT 10/11 03/18/2010    Medtronic revo implant in October 2011. Severe sinus bradycardia in the 30s, but not truly pacemaker dependent   . Long term (current) use of anticoagulants 03/17/2011  . Hypothyroidism 03/04/2011  . HTN (hypertension) 03/04/2011  . Atrial fibrillation 03/04/2011     He was on coumadin until his stroke  last December and was switched to apixaban 2.25m daily which he has been taking faithfully without reported side effects.     Past Surgical History  Procedure Laterality Date  . Coronary artery bypass graft  1989  . Pacemaker insertion  2011  . Insert / replace / remove pacemaker    . Eye surgery    . Joint replacement    . Hernia repair    . Coronary angioplasty with stent placement  2008    stent to SVG to OM   Social History:   reports that he quit smoking about 47 years ago. His smoking use included Cigarettes. He has a 11.25 pack-year smoking history. He has never used smokeless tobacco. He reports that he does not drink alcohol or use illicit drugs.  Family History  Problem Relation Age of Onset  . Diabetes Father   . Other Mother     PAD with amputations    Medications: Patient's Medications  New Prescriptions   No medications on file  Previous Medications   ACETAMINOPHEN (TYLENOL) 325 MG TABLET    Take 2 tablets (650 mg total) by mouth 2 (two) times daily. DO NOT EXCEED 4 TABLETS DAILY   ALLOPURINOL (ZYLOPRIM) 100 MG TABLET    Take 50 mg by mouth daily.    AMOXICILLIN (AMOXIL) 500 MG CAPSULE    Take 500 mg by mouth. Take 4 caps prior to dential   APIXABAN (ELIQUIS) 2.5 MG TABS TABLET    Take 1 tablet (2.5 mg total) by mouth 2 (two) times daily.   CHOLECALCIFEROL (VITAMIN D) 400 UNITS CAPSULE    Take 400 Units by mouth 2 (two) times daily.    FUROSEMIDE (LASIX) 80 MG TABLET    Take 40 mg by mouth daily.    IPRATROPIUM (ATROVENT) 0.06 % NASAL SPRAY    One puff in each nostril before each meal   LEVOTHYROXINE (SYNTHROID, LEVOTHROID) 100 MCG TABLET    Take 100 mcg by mouth daily before breakfast.   LORAZEPAM (ATIVAN) 0.5 MG TABLET    Take one tablet by mouth at bedtime   LOSARTAN (COZAAR) 100 MG TABLET    Take 1 tablet (100 mg total) by mouth daily.   METOPROLOL TARTRATE (LOPRESSOR) 25 MG TABLET    Take 3 tablets (75 mg total) by mouth 2 (two) times daily.   NITROGLYCERIN  (  NITROSTAT) 0.4 MG SL TABLET    Place 0.4 mg under the tongue every 5 (five) minutes as needed for chest pain.    OLOPATADINE (PATANOL) 0.1 % OPHTHALMIC SOLUTION    One drop in each eye twice daily to help conjunctival irritation   POLYVINYL ALCOHOL (LIQUIFILM TEARS) 1.4 % OPHTHALMIC SOLUTION    Place 1 drop into both eyes. One drop in both eyes twice daily as needed for dry eyes   POTASSIUM CHLORIDE (MICRO-K) 10 MEQ CR CAPSULE    Take 10 mEq by mouth 2 (two) times daily.   ZOLPIDEM (AMBIEN) 10 MG TABLET    Take 5 mg by mouth at bedtime as needed for sleep.   Modified Medications   No medications on file  Discontinued Medications   No medications on file   Physical Exam: Physical Exam  Constitutional: He is oriented to person, place, and time. He appears well-developed and well-nourished. No distress.  HENT:  Severe hearing loss. Bilateral aides.Right facial droop. Right side of tongue has traumatic wound from biting and thick while coated tongue.    Eyes:  Corrective lenses. Increased exposure of the right cornea due to post CVA facial droop.  Neck: No JVD present. No tracheal deviation present. No thyromegaly present.  Cardiovascular: Normal rate.  Frequent extrasystoles are present.  Murmur heard. Pacemaker left upper chest. Systolic murmur 9-1/7  Pulmonary/Chest: Effort normal. He has no wheezes. He has rales.  dry rales posterior lung bases.   Abdominal: Soft. Bowel sounds are normal. He exhibits no distension and no mass. There is no tenderness.  Musculoskeletal: Normal range of motion. He exhibits edema. He exhibits no tenderness.  Unstable gait. Using 4 wheel walker and w/c. Weaker on the right side. Hx of the right total knee replacement. Edema is not apparent today.   Lymphadenopathy:    He has no cervical adenopathy.  Neurological: He is alert and oriented to person, place, and time. He displays normal reflexes. No cranial nerve deficit. He exhibits normal muscle tone.  Coordination normal.  Slurred speech. Right side weakness.   Skin: Skin is warm and dry. No rash noted. No erythema. No pallor.  Psychiatric: He has a normal mood and affect. His behavior is normal. Judgment and thought content normal.  Nursing note and vitals reviewed.  Filed Vitals:   03/27/14 1518  BP: 128/74  Pulse: 68  Temp: 97.9 F (36.6 C)  TempSrc: Tympanic  Resp: 20   Labs reviewed: Basic Metabolic Panel:  Recent Labs  04/30/13 1424 04/30/13 1425  05/16/13  08/29/13 09/17/13 12/23/13 12/31/13  NA 141  --   < >  --   < > 137 138 131* 136*  K 4.2  --   < >  --   < > 4.6 4.4 4.3 4.4  CL 104  --   --   --   --   --   --   --   --   CO2 24  --   --   --   --   --   --   --   --   GLUCOSE 111*  --   --   --   --   --   --   --   --   BUN 28*  --   < >  --   < > 23* '20 19 19  ' CREATININE 1.11  --   < >  --   < > 1.1 1.0 0.8 1.0  CALCIUM  9.0  --   --   --   --   --   --   --   --   MG  --  2.2  --   --   --   --   --   --   --   TSH  --   --   < > 3.15  --  0.62  --   --  2.10  < > = values in this interval not displayed. Liver Function Tests:  Recent Labs  04/30/13 1424 05/14/13  AST 21 18  ALT 16 16  ALKPHOS 81 86  BILITOT 0.7  --   PROT 6.4  --   ALBUMIN 3.3*  --    CBC:  Recent Labs  04/30/13 1424 10/29/13 12/23/13  WBC 6.9 6.8 12.2  NEUTROABS 4.5  --   --   HGB 15.7 15.6 15.0  HCT 44.6 45 43  MCV 92.9  --   --   PLT 207 252 174   Lipid Panel:  Recent Labs  05/01/13 0528  CHOL 117  HDL 30*  LDLCALC 71  TRIG 79  CHOLHDL 3.9   Past Procedures:   04/30/2013   CLINICAL DATA:  Stroke.  Ex-smoker.  EXAM: CHEST  2 VIEW  .  IMPRESSION: Minimal bibasilar linear atelectasis.     04/30/2013   CLINICAL DATA:  TRANSIENT ISCHEMIC ATTACK  EXAM: CT HEAD WITHOUT CONTRAST  IMPRESSION: Findings consistent with an area of subacute to chronic lacunar infarction left MCA distribution. Involutional and chronic changes also appreciated. There is no evidence of acute  abnormalities.     05/01/2013    EXAM: MRI HEAD WITHOUT CONTRAST  MRA HEAD WITHOUT CONTRAST    IMPRESSION: 1. Small acute/subacute infarct left corona radiata. No mass effect or hemorrhage. 2. Three superimposed punctate acute lacunar infarcts (right caudate nucleus, left occipital pole, left left parietal lobe). These could reflect synchronous small vessel ischemia rather than a recent embolic event. 3. Underlying chronic small vessel ischemia. 4. Intracranial atherosclerosis. Fusiform aneurysmal enlargement of the left ICA (cavernous segment) and right ICA terminus. No anterior circulation major branch occlusion. 5. Atherosclerosis in stenosis of the distal right vertebral artery which appears hemodynamically significant. Other posterior circulation atherosclerosis without stenosis.     Mr Jodene Nam Head/brain Wo Cm  05/01/2013  EXAM: MRI HEAD WITHOUT CONTRAST  MRA HEAD WITHOUT CONTRAST   IMPRESSION: 1. Small acute/subacute infarct left corona radiata. No mass effect or hemorrhage. 2. Three superimposed punctate acute lacunar infarcts (right caudate nucleus, left occipital pole, left left parietal lobe). These could reflect synchronous small vessel ischemia rather than a recent embolic event. 3. Underlying chronic small vessel ischemia. 4. Intracranial atherosclerosis. Fusiform aneurysmal enlargement of the left ICA (cavernous segment) and right ICA terminus. No anterior circulation major branch occlusion. 5. Atherosclerosis in stenosis of the distal right vertebral artery which appears hemodynamically significant. Other posterior circulation atherosclerosis without stenosis.     EKG Atrial fibrillation Right bundle branch block ST depr, consider ischemia, anterolateral lds Baseline wander in lead(s) II III aVR aVL aVF V3   Assessment/Plan Cough Chronic, seems more noticeable since off Claritin 08/15/13. Denied chest pain, SOB, paroxysmal orthopnea, small yellow phlegm production, no association with  swallowing, or awakening from cough. Will start Mucinex 651m bid x 5 days and Z-pk.     Constipation No BM 2-3 days, will add MiraLax 17gm daily.    Soreness of tongue Traumatic biting injury to the right side tongue(right  sided weakness from previous CVA). Thick yellow coated tongue. Will try Magic Mouth Wash 56m S/S qid for 2 weeks.    GERD (gastroesophageal reflux disease) Stable.      Epistaxis On and off-Afrin prn helped.    Edema Much improved since Furosemide 873m6/11/15.. Trace RLE with mild heat-better today. S/p bypass surgery.      Adjustment disorder with mixed anxiety and depressed mood Post CVA depression and anxiety 02/20/14 Pharm prn Ambien not used, reduce Lorazepam 0.2521mhs.     Memory change Stated he forgets peoples's names which he was used to be good at.     Gout No flare ups. Takes Allopurinol 37m31mily.     Hypothyroidism takes Levothyroxine 75mc38mSH 0.617 08/29/13 and 2.097 12/31/13   HTN (hypertension) Metoprolol 75 mg twice a day and Losartan 100mg 11my and Furosemide 80mg d68m-controlled    Atrial fibrillation Rate controlled, takes Metoprolol 75mg bi64mliquis for VTE risk reduction.        Family/ Staff Communication: observe the patient.   Goals of Care: AL  Labs/tests ordered: CXR done 03/24/14

## 2014-04-08 ENCOUNTER — Encounter: Payer: Self-pay | Admitting: Cardiovascular Disease

## 2014-04-08 ENCOUNTER — Ambulatory Visit (INDEPENDENT_AMBULATORY_CARE_PROVIDER_SITE_OTHER): Payer: Medicare Other | Admitting: Cardiovascular Disease

## 2014-04-08 VITALS — BP 118/70 | HR 69 | Resp 16 | Ht 68.0 in | Wt 156.3 lb

## 2014-04-08 DIAGNOSIS — I495 Sick sinus syndrome: Secondary | ICD-10-CM | POA: Diagnosis not present

## 2014-04-08 DIAGNOSIS — I482 Chronic atrial fibrillation, unspecified: Secondary | ICD-10-CM

## 2014-04-08 DIAGNOSIS — Z950 Presence of cardiac pacemaker: Secondary | ICD-10-CM

## 2014-04-08 HISTORY — DX: Sick sinus syndrome: I49.5

## 2014-04-08 LAB — MDC_IDC_ENUM_SESS_TYPE_INCLINIC
Brady Statistic AP VP Percent: 1.3 %
Brady Statistic AS VP Percent: 44.4 %
Lead Channel Impedance Value: 456 Ohm
Lead Channel Pacing Threshold Pulse Width: 0.4 ms
Lead Channel Sensing Intrinsic Amplitude: 1 mV
Lead Channel Setting Pacing Amplitude: 2.5 V
Lead Channel Setting Pacing Pulse Width: 0.4 ms
Lead Channel Setting Sensing Sensitivity: 0.9 mV
MDC IDC MSMT BATTERY VOLTAGE: 2.97 V
MDC IDC MSMT LEADCHNL RA IMPEDANCE VALUE: 416 Ohm
MDC IDC MSMT LEADCHNL RV PACING THRESHOLD AMPLITUDE: 1 V
MDC IDC MSMT LEADCHNL RV SENSING INTR AMPL: 10.5 mV
MDC IDC SET ZONE DETECTION INTERVAL: 350 ms
MDC IDC STAT BRADY AP VS PERCENT: 0.6 %
MDC IDC STAT BRADY AS VS PERCENT: 53.7 %
Zone Setting Detection Interval: 400 ms

## 2014-04-08 NOTE — Progress Notes (Signed)
Patient ID: Brigg Cape, male   DOB: 10-05-1921, 81 y.o.   MRN: [id number]     Reason for office visit atrial fibrillation with slow ventricular rate, pacemaker follow up  Mr. Kaufman is feeling well. He is unaware of the atrial fibrillation, which has been uninterrupted since February. Rate control is good (44% V paced), virtually no RVR.  Mr. Fulgham is an 81 year old patient of Dr. Debara Pickett who has a long-standing history of sinus node dysfunction and persistent atrial fibrillation. His pacemaker is a Medtronic Revo Dual-chamber device implanted in October 2011 (MRI conditional). History of coronary disease s/p CABG 1989 and placement of stents in the saphenous vein graft to the oblique marginal artery in 2008. He has mild ischemic cardiomyopathy with a left ventricular ejection fraction of roughly 40-45%. His left atrium is moderately dilated.  He suffered an right thalamic ischemic stroke on February 18, 2013 manifesting as slurred speech and weakness in his right leg. He has very mild residual facial asymmetry and slurring of speech. At the time of presentation with stroke he was warfarin anticoagulated with a therapeutic INR. Warfarin was stopped and he was discharged from the hospital on Eliquis, which he is tolerating well.In February 2015 he had right leg weakness and a new small stroke, Eliquis increased to 5 mg BID. The dose was reduced to 2.5 mg daily in August 2015 due to bleeding concerns.   Allergies  Allergen Reactions  . Caduet [Amlodipine-Atorvastatin] Other (See Comments)    Weakness   . Crestor [Rosuvastatin] Other (See Comments)    Muscle pain/weakness  . Cymbalta [Duloxetine Hcl] Other (See Comments)    Excessive sedation  . Lipitor [Atorvastatin Calcium] Other (See Comments)    Muscle pain/weakness  . Lisinopril Other (See Comments)    Doesn't remember reaction  . Quinolones     Risk of rupture of AAA  . Simvastatin Other (See Comments)    Muscle pain/weakness     Current Outpatient Prescriptions  Medication Sig Dispense Refill  . acetaminophen (TYLENOL) 325 MG tablet Take 2 tablets (650 mg total) by mouth 2 (two) times daily. DO NOT EXCEED 4 TABLETS DAILY 360 tablet 1  . allopurinol (ZYLOPRIM) 100 MG tablet Take 50 mg by mouth daily.     Marland Kitchen amoxicillin (AMOXIL) 500 MG capsule Take 500 mg by mouth. Take 4 caps prior to dential    . apixaban (ELIQUIS) 2.5 MG TABS tablet Take 1 tablet (2.5 mg total) by mouth 2 (two) times daily. 60 tablet 11  . Cholecalciferol (VITAMIN D) 400 UNITS capsule Take 400 Units by mouth 2 (two) times daily.     . furosemide (LASIX) 20 MG tablet Take 20 mg by mouth 2 (two) times daily.    Marland Kitchen ipratropium (ATROVENT) 0.06 % nasal spray One puff in each nostril before each meal 15 mL 12  . levothyroxine (SYNTHROID, LEVOTHROID) 100 MCG tablet Take 100 mcg by mouth daily before breakfast.    . LORazepam (ATIVAN) 0.5 MG tablet Take one tablet by mouth at bedtime 30 tablet 5  . losartan (COZAAR) 100 MG tablet Take 1 tablet (100 mg total) by mouth daily. 30 tablet 5  . metoprolol tartrate (LOPRESSOR) 25 MG tablet Take 3 tablets (75 mg total) by mouth 2 (two) times daily. 120 tablet 0  . nitroGLYCERIN (NITROSTAT) 0.4 MG SL tablet Place 0.4 mg under the tongue every 5 (five) minutes as needed for chest pain.     Marland Kitchen olopatadine (PATANOL) 0.1 % ophthalmic solution One drop  in each eye twice daily to help conjunctival irritation 5 mL 12  . polyvinyl alcohol (LIQUIFILM TEARS) 1.4 % ophthalmic solution Place 1 drop into both eyes. One drop in both eyes twice daily as needed for dry eyes    . potassium chloride (MICRO-K) 10 MEQ CR capsule Take 10 mEq by mouth 2 (two) times daily.    Marland Kitchen zolpidem (AMBIEN) 10 MG tablet Take 5 mg by mouth at bedtime as needed for sleep.      No current facility-administered medications for this visit.    Past Medical History  Diagnosis Date  . Hypertension   . Coronary artery disease   . Arthritis   . Cataract    . Anxiety   . Blood transfusion without reported diagnosis   . Hyperlipidemia   . Heart murmur   . Allergy   . Thyroid disease   . PAF (paroxysmal atrial fibrillation)     coumadin  . History of abdominal aortic aneurysm   . History of echocardiogram 12/2008    EF >55%; mild concentric LVH; mild MR; mild-mod TR; mild AV regurg;   . History of nuclear stress test 12/2010    lexiscan; low risk; compared to prior study, perfusion improved  . History of Doppler ultrasound 05/22/2012    LEAs; R anterior tibial artery appeared occluded; L posterior tibial shows short segment of occlusive ds  . History of Doppler ultrasound 05/22/2012    Abdominal Aortic Doppler; slight increase in fusiform aneurysm   . Stroke   . Adjustment disorder with mixed anxiety and depressed mood 04/18/2013    Post CVA depression and anxiety   . Abnormality of gait 04/18/2013  . 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 Increased exposure of the right cornea.   . Pruritic condition 01/24/2013  . Memory change 01/24/2013  . Abnormal PFTs 10/30/2012    Followed in Pulmonary clinic/ [facility]/ [place]  - PFT's 10/22/12 VC  55% no obst, DLCO 50%  - PFT's 12/20/2012 VC 83% and no obst, dLCO 55%  -11/08/2012  Walked RA x 3 laps @ 185 ft each stopped due to  End of study, not desat -11/08/12 esr 10    . Impotence of organic origin 10/03/2000  . Osteoarthrosis, unspecified whether generalized or localized, unspecified site 02/05/2003  . Hypertrophy of prostate with urinary obstruction and other lower urinary tract symptoms (LUTS) 04/28/2004  . Gout, unspecified 11/03/2004  . Acute on chronic systolic congestive heart failure, NYHA class 2 03/16/2006    BNP 376.9 05/28/13   . Atherosclerosis of renal artery 10/18/2006  . Unspecified vitamin D deficiency 10/18/2006  . Obstructive sleep apnea (adult) (pediatric) 07/23/2008  . Major depressive disorder, single episode, unspecified 03/05/2009  . Basal cell carcinoma  of skin of other and unspecified parts of face 12/24/2009  . Internal hemorrhoids without mention of complication 24/26/8341  . Muscle weakness (generalized) 03/10/2011  . Pain in joint, lower leg 08/04/2011  . Nocturia 10/27/2011  . Aortic aneurysm of unspecified site without mention of rupture 10/27/2011  . Fall 03/04/2011  . Hyponatremia 03/04/2011  . TIA (transient ischemic attack) 04/30/2013  . Fatigue 04/18/2013  . 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 CVA - Rt brain stroke 12/14 02/18/2013    02/19/13 angiography CT head:  Diffuse atherosclerotic irregularity and plaque formation of the distal right common carotid artery and proximal internal carotid artery without significant stenosis. Plaque  ulceration is present and is a source of emboli. A small right thalamic CVA    . Dyslipidemia 11/03/2004  . Type II or unspecified type diabetes mellitus without mention of complication, not stated as uncontrolled 11/24/2004  . Spinal stenosis in cervical region 12/15/2004  . Right bundle branch block 04/19/2006  . Cardiac pacemaker in situ- MDT 10/11 03/18/2010    Medtronic revo implant in October 2011. Severe sinus bradycardia in the 30s, but not truly pacemaker dependent   . Long term (current) use of anticoagulants 03/17/2011  . Hypothyroidism 03/04/2011  . HTN (hypertension) 03/04/2011  . Atrial fibrillation 03/04/2011     He was on coumadin until his stroke last December and was switched to apixaban 2.$RemoveBefor'5mg'vmSzIfMMGbyn$  daily which he has been taking faithfully without reported side effects.    . SSS (sick sinus syndrome) 04/08/2014    Past Surgical History  Procedure Laterality Date  . Coronary artery bypass graft  1989  . Pacemaker insertion  2011  . Insert / replace / remove pacemaker    . Eye surgery    . Joint replacement    . Hernia repair    . Coronary angioplasty with stent placement  2008    stent to SVG to OM    Family History  Problem  Relation Age of Onset  . Diabetes Father   . Other Mother     PAD with amputations    History   Social History  . Marital Status: Married    Spouse Name: [name]    Number of Children: 3  . Years of Education: BA   Occupational History  . Retired Armed forces operational officer    Social History Main Topics  . Smoking status: Former Smoker -- 0.75 packs/day for 15 years    Types: Cigarettes    Quit date: 03/04/1967  . Smokeless tobacco: Never Used  . Alcohol Use: No  . Drug Use: No  . Sexual Activity: No   Other Topics Concern  . Not on file   Social History Narrative   [place], moved to [place] 04/2013   Married Inez Catalina   Stopped smoking 1968   Alcohol none   Exercise  Stepper nightly, exercise class in morning   Walks with walker   Living Will, POA             Review of systems: Mild residual slurred speech and facial asymmetry. He complains of lack of energy, lethargy, denies dyspnea or chest pain. The patient specifically denies orthopnea, paroxysmal nocturnal dyspnea, syncope, palpitations, new focal neurological deficits, intermittent claudication, lower extremity edema, unexplained weight gain, cough, hemoptysis or wheezing.  The patient also denies abdominal pain, nausea, vomiting, dysphagia, diarrhea, constipation, polyuria, polydipsia, dysuria, hematuria, frequency, urgency, abnormal bleeding or bruising, fever, chills, unexpected weight changes, mood swings, change in skin or hair texture, change in voice quality, auditory or visual problems, allergic reactions or rashes, new musculoskeletal complaints other than usual "aches and pains".  PHYSICAL EXAM BP 118/70 mmHg  Pulse 69  Resp 16  Ht $R'5\' 8"'ox$  (1.727 m)  Wt 156 lb 4.8 oz (70.897 kg)  BMI 23.77 kg/m2 General: Alert, oriented x3, no distress Head: no evidence of trauma, PERRL, EOMI, no exophtalmos or lid lag, no myxedema, no xanthelasma; normal ears, nose and oropharynx Neck: normal jugular  venous pulsations and no hepatojugular reflux; brisk carotid pulses without delay and no carotid bruits Chest: clear to auscultation, no signs of consolidation by percussion or palpation, normal  fremitus, symmetrical and full respiratory excursions Cardiovascular: normal position and quality of the apical impulse, regular rhythm, normal first and second heart sounds, no murmurs, rubs or gallops Abdomen: no tenderness or distention, no masses by palpation, no abnormal pulsatility or arterial bruits, normal bowel sounds, no hepatosplenomegaly Extremities: no clubbing, cyanosis or edema; 2+ radial, ulnar and brachial pulses bilaterally; 2+ right femoral, posterior tibial and dorsalis pedis pulses; 2+ left femoral, posterior tibial and dorsalis pedis pulses; no subclavian or femoral bruits Neurological: Mild dysarthria, subtle facial droop on the left   EKG: Atrial fibrillation with intermittent ventricular pacing; right bundle branch block  Lipid Panel     Component Value Date/Time   CHOL 117 05/01/2013 0528   TRIG 79 05/01/2013 0528   HDL 30* 05/01/2013 0528   CHOLHDL 3.9 05/01/2013 0528   VLDL 16 05/01/2013 0528   LDLCALC 71 05/01/2013 0528    BMET    Component Value Date/Time   NA 136* 12/31/2013   NA 141 04/30/2013 1424   K 4.4 12/31/2013   CL 104 04/30/2013 1424   CO2 24 04/30/2013 1424   GLUCOSE 111* 04/30/2013 1424   BUN 19 12/31/2013   BUN 28* 04/30/2013 1424   CREATININE 1.0 12/31/2013   CREATININE 1.11 04/30/2013 1424   CREATININE 1.18 03/27/2013 1636   CALCIUM 9.0 04/30/2013 1424   GFRNONAA 57* 04/30/2013 1424   GFRAA 66* 04/30/2013 1424     ASSESSMENT AND PLAN  Atrial fibrillation now appears permanent, without symptomatic detriment. Device function is normal, reprogrammed VVIR for better longevity. Carelink remote checks Q3 months, office check yearly.  He asked about occasional use of NSAIDs since acetaminophen is sometimes not enough for his back pain.  Discussed the increased risk of GI bleeding with NSAIDs and recommended they be used very sparingly.  Orders Placed This Encounter  Procedures  . Implantable device check  . EKG 12-Lead   Meds ordered this encounter  Medications  . furosemide (LASIX) 20 MG tablet    Sig: Take 20 mg by mouth 2 (two) times daily.    Holli Humbles, MD, [place] [phone] office [phone] pager

## 2014-04-08 NOTE — Patient Instructions (Signed)
Remote monitoring is used to monitor your PACEMAKER from home. This monitoring reduces the number of office visits required to check your device to one time per year. It allows [name] to keep an eye on the functioning of your device to ensure it is working properly. You are scheduled for a device check from home on 07-08-2014. You may send your transmission at any time that day. If you have a wireless device, the transmission will be sent automatically. After your physician reviews your transmission, you will receive a postcard with your next transmission date.   Your physician recommends that you schedule a follow-up appointment in: 12 months with Dr.Croitoru

## 2014-04-14 ENCOUNTER — Encounter: Payer: Self-pay | Admitting: Neurology

## 2014-04-14 ENCOUNTER — Ambulatory Visit (INDEPENDENT_AMBULATORY_CARE_PROVIDER_SITE_OTHER): Payer: Medicare Other | Admitting: Neurology

## 2014-04-14 VITALS — BP 112/68 | HR 60 | Ht 68.0 in | Wt 154.6 lb

## 2014-04-14 DIAGNOSIS — I6340 Cerebral infarction due to embolism of unspecified cerebral artery: Secondary | ICD-10-CM

## 2014-04-14 DIAGNOSIS — I482 Chronic atrial fibrillation, unspecified: Secondary | ICD-10-CM

## 2014-04-14 DIAGNOSIS — Z950 Presence of cardiac pacemaker: Secondary | ICD-10-CM | POA: Diagnosis not present

## 2014-04-14 NOTE — Progress Notes (Signed)
STROKE NEUROLOGY FOLLOW UP NOTE  NAME: Tome Wilson DOB: 1921/08/24  REASON FOR VISIT: stroke follow up HISTORY FROM: pt and daughter and chart  Today we had the pleasure of seeing Kelsen Celona in follow-up at our Neurology Clinic. Pt was accompanied by daughter.   History Summary Roddrick Sharron is a 81 y.o. right handed white male with a PMH significant for HTN, HLD, CAD s/p CABG, paroxysmal afib s/p pacemaker, s/p right CEA, recurrent ischemic strokes and AAA followed up in clinic for stroke. He had stroke in Dec 2014 with dysarthria and right face droop. Prior to the stroke, pt was on coumadin, and INR was 2.02 at that time. Nonetheless, he was switched to eliquis 2.5mg  bid for stroke prevention.   In 04/2013, pt was again admitted due to acute onset RLE weakness. He said that he lost control of that leg for approximately 30 minutes. Denies weakness of the right arm, HA, vertigo, double vision, difficulty swallowing, confusion, or visual disturbances. He talked to his daughters and was advised to come to the ED. He stated that has been taking faithfully without reported side effects. CT brain upon arrival to the ED showed findings consistent with an area of subacute to chronic lacunar infarction left MCA distribution and a subsequent MRI-DWI demonstrated: " small acute/subacute infarct left corona radiata. No mass effect or hemorrhage. 2. Three superimposed punctate acute lacunar infarcts (right caudate  nucleus, left occipital pole, left left parietal lobe). These could reflect synchronous small vessel ischemia rather than a recent embolic event". MRA brain showed intracranial atherosclerosis and fusiform enlargement of the cavernous left ICA up to 9 mm diameter as well as fusiform enlargement of the right ICA terminus, up to 6 mm diameter. Total cholesterol 117, triglycerides 79, HDL 30, LDL 71. Eliquis was increased to 5 mg twice daily. Metoprolol was increased from 50 mg twice a  day to 75 mg twice a day for better blood pressure control. Patient had resolution of his symptoms. He was discharged back to ALF.  10/01/13 follow up - the patient has been doing well. He stated that he did not have another episode of weakness, numbness, however, he still has some dysarthria and slurry speech. He continued to take eliquis 5mg  bid and no missing doses. He also complained that he is on lasix and had urinary and bowel urgency. If he did not go to bathroom quick, he will have accident.   01/06/14 follow up - he was doing the same. However, he had epistaxis in 10/2013 and as per daughter it was bad bleeding, "horrible". He went to see cardiologist Dr. Debara Pickett and was felt risk more than benefit for him to be on eliquis 5mg  bid and his eliquis was decreased to 2.5mg  bid. He was also seen recently by Man Mast, NP for his UTI with E. Coli in culture. Pt stated that he had one day of hematuria (flank blood) during the UTI. He also complained of urinary urgency after taking lasix 40mg  in the morning. Daughter requested if the lasix can be divided to 20mg  bid.  Interval History During the interval time,  he was doing well, no recurrent strokelike symptoms. So far tolerating Eliquis 2.5 mg twice today. He stated that he still has some intermittent mild nosebleed, but not bad at all. His blood pressure in clinic today 112/68, he is Currently on Lasix, losartan, metoprolol for blood pressure. He lives in assisted living facility, his blood pressure was not regularly checked. Daughter  reports patient lack of energy, but patient denies any depression or depressed mood. He recently had lower back pain, which caused him walking slow.  REVIEW OF SYSTEMS: Full 14 system review of systems performed and notable only for those listed below and in HPI above, all others are negative:  Constitutional: [name]  Cardiovascular: leg swelling  Ear/Nose/Throat: hearing loss, running nose, drooling  Skin: itching  Eyes: eye  itching, light sensitivity  Respiratory: Cough, shortness breath, choking  Gastroitestinal: urinary urgency and frequent  Hematology/Lymphatic: bruising easily  Endocrine: cold/heat intolerance  Musculoskeletal: joint pain, walking difficulty, back pain, muscle cramps  Allergy/Immunology: [name]  Neurological: slurry speech, facial drooping  Psychiatric: depression  The following represents the patient's updated allergies and side effects list: Allergies  Allergen Reactions  . Caduet [Amlodipine-Atorvastatin] Other (See Comments)    Weakness   . Crestor [Rosuvastatin] Other (See Comments)    Muscle pain/weakness  . Cymbalta [Duloxetine Hcl] Other (See Comments)    Excessive sedation  . Lipitor [Atorvastatin Calcium] Other (See Comments)    Muscle pain/weakness  . Lisinopril Other (See Comments)    Doesn't remember reaction  . Quinolones     Risk of rupture of AAA  . Simvastatin Other (See Comments)    Muscle pain/weakness    Labs since last visit of relevance include the following: Results for orders placed or performed in visit on 04/08/14  Implantable device check  Result Value Ref Range   Pulse Generator Manufacturer [employer]    Pulse Gen Model RVDR01 Revo MRI    Pulse Gen Serial Number [id number]    RV Sense Sensitivity 0.9 mV   RV Pace PulseWidth 0.4 ms   RV Pace Amplitude 2.5 V   Zone Setting Type Category VF    Zone Setting Type Category VT    Zone Setting Type Category VENTRICULAR_TACHYCARDIA_1    Zone Setting Type Category VENTRICULAR_TACHYCARDIA_2    Zone Detect Interval 400 ms   Zone Setting Type Category ATRIAL_FIBRILLATION    Zone Setting Type Category ATAF    Zone Detect Interval 350 ms   RA Impedance 416 ohm   RA Amplitude 1.0 mV   RV IMPEDANCE 456 ohm   RV Amplitude 10.5 mV   RV Pacing Amplitude 1.0 V   RV Pacing PulseWidth 0.4 ms   Battery Voltage 2.97 V   Brady AP VP Percent 1.3 %   Brady AS VP Percent 44.4 %   Brady AP VS Percent 0.6 %    Brady AS VS Percent 53.7 %   Eval Rhythm AF/VS    Miscellaneous Comment      Pacemaker check in clinic. Normal device function. Threshold, sensing, impedances consistent with previous measurements. Device programmed to maximize longevity. Persistant AF + Eliquis/ASA. No high ventricular rates noted. Device programmed at  appropriate safety margins. Histogram distribution appropriate for patient activity level. Device programmed to optimize intrinsic conduction. Mode reprogrammed from DDDR to VVIR. Batt voltage 2.97V (ERI 2.81V). Patient will follow up via [facility] on 5-3  and with [facility] in 12 months.     The neurologically relevant items on the patient's problem list were reviewed on today's visit.  Neurologic Examination  A problem focused neurological exam (12 or more points of the single system neurologic examination, vital signs counts as 1 point, cranial nerves count for 8 points) was performed.  Blood pressure 112/68, pulse 60, height 5\' 8"  (1.727 m), weight 154 lb 9.6 oz (70.126 kg).  General - Well nourished, well developed, in no apparent  distress.  Ophthalmologic - not able to see throught.  Cardiovascular - irregularly irregular heart rate and rhythm.  Mental Status -  Level of arousal and orientation to time, place, and person were intact. Language including expression, naming, repetition, comprehension, reading was assessed and found intact. Mild dysarthria.  Cranial Nerves II - XII - II - Vision intact OU. III, IV, VI - Extraocular movements intact. V - Facial sensation intact bilaterally. VII - Facial movement intact bilaterally. VIII - Hearing & vestibular intact bilaterally. X - Palate elevates symmetrically, mild dysarthria. XI - Chin turning & shoulder shrug intact bilaterally. XII - Tongue protrusion intact.  Motor Strength - The patient's strength was 4+/5 in all extremities and pronator drift was absent.  Bulk was normal and fasciculations were absent.   Motor  Tone - Muscle tone was assessed at the neck and appendages and was normal.  Reflexes - The patient's reflexes were 1+ in all extremities and he had no pathological reflexes. No rigidity.  Sensory - Light touch, temperature/pinprick were assessed and were normal.    Coordination - The patient had normal movements in the hands with no ataxia or dysmetria.  Tremor was absent.  Gait and Station - walk with cane due to lower back pain, slow but no hemiparetic gait.  Data reviewed: I personally reviewed the images and agree with the radiology interpretations.  MRI and MRA 05/01/13 1. Small acute/subacute infarct left corona radiata. No mass effect or hemorrhage. 2. Three superimposed punctate acute lacunar infarcts (right caudate nucleus, left occipital pole, left left parietal lobe). These could reflect synchronous small vessel ischemia rather than a recent embolic event. 3. Underlying chronic small vessel ischemia. 4. Intracranial atherosclerosis. Fusiform aneurysmal enlargement of the left ICA (cavernous segment) and right ICA terminus. No anterior circulation major branch occlusion. 5. Atherosclerosis in stenosis of the distal right vertebral artery which appears hemodynamically significant. Other posterior circulation atherosclerosis without stenosis. CT 04/2013 Findings consistent with an area of subacute to chronic lacunar infarction left MCA distribution. Involutional and chronic changes also appreciated. There is no evidence of acute abnormalities. Diffuse atherosclerotic disease  CTA head 02/2013 Moderate stenosis of the right cavernous carotid with heavily calcified plaque. There is fusiform dilatation of the right supra clinoid internal carotid artery compatible with atherosclerotic disease. Mild to moderate atherosclerotic stenosis of the left cavernous carotid.  Calcified plaque with mild to moderate stenosis distal vertebral artery bilaterally Diffuse atherosclerotic irregularity and  plaque formation of the distal right common carotid artery and proximal internal carotid artery without significant stenosis. Plaque ulceration is present and is a source of emboli. Atherosclerotic disease in the left carotid bifurcation without hemodynamically significant stenosis. 2D ehco 02/2013 - Left ventricle: The cavity size was normal. Wall thickness   was increased in a pattern of moderate LVH. There was   focal basal hypertrophy. Systolic function was mildly to   moderately reduced. The estimated ejection fraction was in   the range of 40% to 45%. - Aortic valve: Mild regurgitation. Valve area: 2.29cm^2   (Vmax). - Left atrium: The atrium was moderately dilated. - Right ventricle: The cavity size was mildly dilated. - Right atrium: The atrium was mildly dilated. CUS 02/2013 - Findings consistent with1- 39 percent stenosis involving the right internal carotid artery and the left internal carotid artery. - The right vertebral demonstrates atypical waveforms.   Possible proximal obstruction.   The left vertebral flow is antegrade. - ICA/CCA Ratio. right = 0.33. left= 0.84  Component  Latest Ref Rng 05/01/2013 05/14/2013 05/16/2013 05/28/2013  Cholesterol     0 - 200 mg/dL 117     Triglycerides     <150 mg/dL 79     HDL     >39 mg/dL 30 (L)     Total CHOL/HDL Ratio      3.9     VLDL     0 - 40 mg/dL 16     LDL (calc)     0 - 99 mg/dL 71     Glucose         101  BUN     4 - 21 mg/dL    25 (A)  Creatinine     0.6 - 1.3 mg/dL    1.1  Potassium     3.4 - 5.3 mmol/L    4.0  Sodium     137 - 147 mmol/L    140  Hemoglobin A1C     4.0 - 6.0 % 6.3 (H)     Mean Plasma Glucose     <117 mg/dL 134 (H)     TSH     0.41 - 5.90 uIU/mL  3.68 3.15    Component     Latest Ref Rng 08/29/2013 09/17/2013  Cholesterol     0 - 200 mg/dL    Triglycerides     <150 mg/dL    HDL     >39 mg/dL    Total CHOL/HDL Ratio         VLDL     0 - 40 mg/dL    LDL (calc)     0 - 99 mg/dL     Glucose       110  BUN     4 - 21 mg/dL  20  Creatinine     0.6 - 1.3 mg/dL  1.0  Potassium     3.4 - 5.3 mmol/L  4.4  Sodium     137 - 147 mmol/L  138  Hemoglobin A1C     4.0 - 6.0 % 6.5 (A)   Mean Plasma Glucose     <117 mg/dL    TSH     0.41 - 5.90 uIU/mL 0.62    Component     Latest Ref Rng 12/31/2013  Glucose      111  BUN     4 - 21 mg/dL 19  Creatinine     0.6 - 1.3 mg/dL 1.0  Potassium     3.4 - 5.3 mmol/L 4.4  Sodium     137 - 147 mmol/L 136 (A)  TSH     0.41 - 5.90 uIU/mL 2.10   Assessment: As you may recall, he is a 81 y.o. Caucasian male with PMH significant for HTN, HLD, CAD s/p CABG, paroxysmal afib s/p pacemaker, s/p right CEA, recurrent ischemic strokes and AAA followed up in clinic for stroke. He had first stroke in 02/2013, changed from coumadin to eliquis but on low dose. Had residue of slurry speech. In 04/2013 he had another stroke complaining of RLE weakness for 35min and resolved. MRI showed multiple punctate or small infarcts bilatearlly. He was put on eliquis 5mg  bid. However, he had epistaxis and hematuria during the interval time. Cardiologist Dr. Debara Pickett changed him back to 2.5mg  bid. So far he is tolerating 2.5 bid. He has side effect profile with Lipitor, Crestor, simvastatin. LDL last check 71, not on statin now.  Plan:  - continue coumadin 2.5mg  twice a day. Although 5mg  bid is the  appropriate dose for him, he had episode of severe epistaxis and hematuria during UTI, Dr. Debara Pickett put him back to 2.5mg  bid due to risk overweigh benefit.Pt agree with continuing 2.5mg  bid to avoid bleeding risk with knowing that low dose may not give him full protection for afib related embolic stroke. - Check blood pressure at the facility, avoid hypotension - Close monitor for signs of depression. May consider SSRI if confirmed,. - Follow up with your primary care physician for stroke risk factor modification. Recommend maintain blood pressure goal <130/80, diabetes  with hemoglobin A1c goal below 6.5% and lipids with LDL cholesterol goal below 70 mg/dL.  - Follow-up with physical therapy for lower back pain. - RTC in 6 months.  Rosalin Hawking, MD PhD [facility] [address], [place], [place] [address] [phone]

## 2014-04-17 ENCOUNTER — Encounter: Payer: Self-pay | Admitting: Nurse Practitioner

## 2014-04-17 DIAGNOSIS — K5900 Constipation, unspecified: Secondary | ICD-10-CM

## 2014-04-17 NOTE — Progress Notes (Signed)
This encounter was created in error - please disregard.

## 2014-04-18 DIAGNOSIS — I279 Pulmonary heart disease, unspecified: Secondary | ICD-10-CM | POA: Diagnosis not present

## 2014-04-18 DIAGNOSIS — N419 Inflammatory disease of prostate, unspecified: Secondary | ICD-10-CM | POA: Diagnosis not present

## 2014-04-18 DIAGNOSIS — I10 Essential (primary) hypertension: Secondary | ICD-10-CM | POA: Diagnosis not present

## 2014-04-18 DIAGNOSIS — R600 Localized edema: Secondary | ICD-10-CM | POA: Diagnosis not present

## 2014-04-18 LAB — BASIC METABOLIC PANEL
BUN: 24 mg/dL — AB (ref 4–21)
CREATININE: 1 mg/dL (ref 0.6–1.3)
Glucose: 99 mg/dL
Potassium: 4.6 mmol/L (ref 3.4–5.3)
Sodium: 134 mmol/L — AB (ref 137–147)

## 2014-04-21 ENCOUNTER — Other Ambulatory Visit: Payer: Self-pay | Admitting: Nurse Practitioner

## 2014-05-08 DIAGNOSIS — M545 Low back pain: Secondary | ICD-10-CM | POA: Diagnosis not present

## 2014-05-08 DIAGNOSIS — M533 Sacrococcygeal disorders, not elsewhere classified: Secondary | ICD-10-CM | POA: Diagnosis not present

## 2014-05-14 DIAGNOSIS — H16141 Punctate keratitis, right eye: Secondary | ICD-10-CM | POA: Diagnosis not present

## 2014-05-21 DIAGNOSIS — L218 Other seborrheic dermatitis: Secondary | ICD-10-CM | POA: Diagnosis not present

## 2014-05-21 DIAGNOSIS — Z85828 Personal history of other malignant neoplasm of skin: Secondary | ICD-10-CM | POA: Diagnosis not present

## 2014-05-21 DIAGNOSIS — L820 Inflamed seborrheic keratosis: Secondary | ICD-10-CM | POA: Diagnosis not present

## 2014-05-21 DIAGNOSIS — L853 Xerosis cutis: Secondary | ICD-10-CM | POA: Diagnosis not present

## 2014-05-29 ENCOUNTER — Non-Acute Institutional Stay: Payer: Medicare Other | Admitting: Nurse Practitioner

## 2014-05-29 ENCOUNTER — Encounter: Payer: Self-pay | Admitting: Nurse Practitioner

## 2014-05-29 DIAGNOSIS — I480 Paroxysmal atrial fibrillation: Secondary | ICD-10-CM | POA: Diagnosis not present

## 2014-05-29 DIAGNOSIS — I10 Essential (primary) hypertension: Secondary | ICD-10-CM | POA: Diagnosis not present

## 2014-05-29 DIAGNOSIS — M159 Polyosteoarthritis, unspecified: Secondary | ICD-10-CM

## 2014-05-29 DIAGNOSIS — M545 Low back pain, unspecified: Secondary | ICD-10-CM | POA: Insufficient documentation

## 2014-05-29 DIAGNOSIS — I5022 Chronic systolic (congestive) heart failure: Secondary | ICD-10-CM | POA: Diagnosis not present

## 2014-05-29 DIAGNOSIS — M1000 Idiopathic gout, unspecified site: Secondary | ICD-10-CM | POA: Diagnosis not present

## 2014-05-29 DIAGNOSIS — F4323 Adjustment disorder with mixed anxiety and depressed mood: Secondary | ICD-10-CM

## 2014-05-29 DIAGNOSIS — R3915 Urgency of urination: Secondary | ICD-10-CM

## 2014-05-29 DIAGNOSIS — K5900 Constipation, unspecified: Secondary | ICD-10-CM

## 2014-05-29 DIAGNOSIS — R609 Edema, unspecified: Secondary | ICD-10-CM | POA: Diagnosis not present

## 2014-05-29 DIAGNOSIS — M150 Primary generalized (osteo)arthritis: Secondary | ICD-10-CM | POA: Diagnosis not present

## 2014-05-29 DIAGNOSIS — M5440 Lumbago with sciatica, unspecified side: Secondary | ICD-10-CM | POA: Diagnosis not present

## 2014-05-29 DIAGNOSIS — E039 Hypothyroidism, unspecified: Secondary | ICD-10-CM

## 2014-05-29 NOTE — Assessment & Plan Note (Signed)
Metoprolol 75 mg twice a day and Losartan 100mg  daily and Furosemide 80mg  daily-controlled

## 2014-05-29 NOTE — Assessment & Plan Note (Signed)
Trace in ankles.

## 2014-05-29 NOTE — Assessment & Plan Note (Signed)
takes Levothyroxine 67mcg, TSH 0.617 08/29/13 and 2.097 12/31/13

## 2014-05-29 NOTE — Assessment & Plan Note (Signed)
No flare ups. Takes Allopurinol 50mg  daily.

## 2014-05-29 NOTE — Assessment & Plan Note (Signed)
Stable since 04/15/14 MiraLax prn

## 2014-05-29 NOTE — Assessment & Plan Note (Signed)
Rate controlled, takes Metoprolol 75mg  bid. Eliquis for VTE risk reduction.

## 2014-05-29 NOTE — Assessment & Plan Note (Signed)
Post CVA depression and anxiety 02/20/14 Pharm prn Ambien not used, reduce Lorazepam 0.25mg  qhs.  --stable.

## 2014-05-29 NOTE — Assessment & Plan Note (Addendum)
05/07/14 X-ray Lumbar psine, sacrum, and coccyx: mild degree of osteoporosis, moderate spondylosis, mild osteoarthritis. PT to eval and tx. OTC Tylenol prn

## 2014-05-29 NOTE — Assessment & Plan Note (Signed)
03/24/14 CXR minimal bibasilar atelectasis.  Clinical compensated taking Furosemide

## 2014-05-29 NOTE — Assessment & Plan Note (Signed)
It seems worse over the period of time-will update UA and CBC. F/u Urology.

## 2014-05-29 NOTE — Progress Notes (Signed)
Patient ID: Jon [name], male   DOB: 1921-12-17, 81 y.o.   MRN: [id number]   Code Status: DNR  Allergies  Allergen Reactions  . Caduet [Amlodipine-Atorvastatin] Other (See Comments)    Weakness   . Crestor [Rosuvastatin] Other (See Comments)    Muscle pain/weakness  . Cymbalta [Duloxetine Hcl] Other (See Comments)    Excessive sedation  . Lipitor [Atorvastatin Calcium] Other (See Comments)    Muscle pain/weakness  . Lisinopril Other (See Comments)    Doesn't remember reaction  . Quinolones     Risk of rupture of AAA  . Simvastatin Other (See Comments)    Muscle pain/weakness    Chief Complaint  Patient presents with  . Medical Management of Chronic Issues  . Acute Visit    lower back pain, urinary leakage    HPI: Patient is a 81 y.o. male seen in the [facility] at [facility] today for evaluation of lower back pain, urinary leakage,  and other chronic medical conditions.  Problem List Items Addressed This Visit    Atrial fibrillation (Chronic)    Rate controlled, takes Metoprolol 75mg  bid. Eliquis for VTE risk reduction.         HTN (hypertension) (Chronic)    Metoprolol 75 mg twice a day and Losartan 100mg  daily and Furosemide 80mg  daily-controlled        Hypothyroidism (Chronic)    takes Levothyroxine 79mcg, TSH 0.617 08/29/13 and 2.097 12/31/13       Chronic systolic congestive heart failure (Chronic)    03/24/14 CXR minimal bibasilar atelectasis.  Clinical compensated taking Furosemide        Gout    No flare ups. Takes Allopurinol 50mg  daily.        Osteoarthritis    Lower back pain with movement-PT to eval tx.       Adjustment disorder with mixed anxiety and depressed mood    Post CVA depression and anxiety 02/20/14 Pharm prn Ambien not used, reduce Lorazepam 0.25mg  qhs.  --stable.        Edema    Trace in ankles.       Urgency of urination - Primary    It seems worse over the period of time-will update UA and CBC. F/u  Urology.       Constipation    Stable since 04/15/14 MiraLax prn       Lower back pain    05/07/14 X-ray Lumbar psine, sacrum, and coccyx: mild degree of osteoporosis, moderate spondylosis, mild osteoarthritis. PT to eval and tx. OTC Tylenol prn         Review of Systems:  Review of Systems  Constitutional: Negative for fever, chills and diaphoresis.  HENT: Positive for hearing loss. Negative for congestion, ear discharge, ear pain, nosebleeds, sore throat and tinnitus.   Eyes: Negative for photophobia, pain, discharge and redness.  Respiratory: Negative for cough, shortness of breath, wheezing and stridor.   Cardiovascular: Positive for leg swelling. Negative for chest pain and palpitations.       Trace BLE  Gastrointestinal: Negative for nausea, vomiting, abdominal pain, diarrhea, constipation and blood in stool.  Endocrine: Negative for polydipsia.  Genitourinary: Positive for frequency. Negative for dysuria, urgency, hematuria and flank pain.  Musculoskeletal: Positive for back pain. Negative for myalgias and neck pain.  Skin: Negative for rash.  Allergic/Immunologic: Negative for environmental allergies.  Neurological: Negative for dizziness, tremors, seizures, weakness and headaches.  Hematological: Bruises/bleeds easily.  Psychiatric/Behavioral: Negative for suicidal ideas and hallucinations. The patient is  nervous/anxious.     Past Medical History  Diagnosis Date  . Hypertension   . Coronary artery disease   . Arthritis   . Cataract   . Anxiety   . Blood transfusion without reported diagnosis   . Hyperlipidemia   . Heart murmur   . Allergy   . Thyroid disease   . PAF (paroxysmal atrial fibrillation)     coumadin  . History of abdominal aortic aneurysm   . History of echocardiogram 12/2008    EF >55%; mild concentric LVH; mild MR; mild-mod TR; mild AV regurg;   . History of nuclear stress test 12/2010    lexiscan; low risk; compared to prior study, perfusion  improved  . History of Doppler ultrasound 05/22/2012    LEAs; R anterior tibial artery appeared occluded; L posterior tibial shows short segment of occlusive ds  . History of Doppler ultrasound 05/22/2012    Abdominal Aortic Doppler; slight increase in fusiform aneurysm   . Stroke   . Adjustment disorder with mixed anxiety and depressed mood 04/18/2013    Post CVA depression and anxiety   . Abnormality of gait 04/18/2013  . 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 Increased exposure of the right cornea.   . Pruritic condition 01/24/2013  . Memory change 01/24/2013  . Abnormal PFTs 10/30/2012    Followed in Pulmonary clinic/ [facility]/ [place]  - PFT's 10/22/12 VC  55% no obst, DLCO 50%  - PFT's 12/20/2012 VC 83% and no obst, dLCO 55%  -11/08/2012  Walked RA x 3 laps @ 185 ft each stopped due to  End of study, not desat -11/08/12 esr 10    . Impotence of organic origin 10/03/2000  . Osteoarthrosis, unspecified whether generalized or localized, unspecified site 02/05/2003  . Hypertrophy of prostate with urinary obstruction and other lower urinary tract symptoms (LUTS) 04/28/2004  . Gout, unspecified 11/03/2004  . Acute on chronic systolic congestive heart failure, NYHA class 2 03/16/2006    BNP 376.9 05/28/13   . Atherosclerosis of renal artery 10/18/2006  . Unspecified vitamin D deficiency 10/18/2006  . Obstructive sleep apnea (adult) (pediatric) 07/23/2008  . Major depressive disorder, single episode, unspecified 03/05/2009  . Basal cell carcinoma of skin of other and unspecified parts of face 12/24/2009  . Internal hemorrhoids without mention of complication 34/74/2595  . Muscle weakness (generalized) 03/10/2011  . Pain in joint, lower leg 08/04/2011  . Nocturia 10/27/2011  . Aortic aneurysm of unspecified site without mention of rupture 10/27/2011  . Fall 03/04/2011  . Hyponatremia 03/04/2011  . TIA (transient ischemic attack) 04/30/2013  . Fatigue 04/18/2013  . Speech and  language deficit due to old cerebral infarction 04/18/2013    Slurred speech   . CVA - Rt brain stroke 12/14 02/18/2013    02/19/13 angiography CT head:  Diffuse atherosclerotic irregularity and plaque formation of the distal right common carotid artery and proximal internal carotid artery without significant stenosis. Plaque ulceration is present and is a source of emboli. A small right thalamic CVA    . Dyslipidemia 11/03/2004  . Type II or unspecified type diabetes mellitus without mention of complication, not stated as uncontrolled 11/24/2004  . Spinal stenosis in cervical region 12/15/2004  . Right bundle branch block 04/19/2006  . Cardiac pacemaker in situ- MDT 10/11 03/18/2010    Medtronic revo implant in October 2011. Severe sinus bradycardia in the 30s, but not truly pacemaker dependent   . Long term (current) use of  anticoagulants 03/17/2011  . Hypothyroidism 03/04/2011  . HTN (hypertension) 03/04/2011  . Atrial fibrillation 03/04/2011     He was on coumadin until his stroke last December and was switched to apixaban 2.$RemoveBefor'5mg'[id number]$  daily which he has been taking faithfully without reported side effects.    . SSS (sick sinus syndrome) 04/08/2014   Past Surgical History  Procedure Laterality Date  . Coronary artery bypass graft  1989  . Pacemaker insertion  2011  . Insert / replace / remove pacemaker    . Eye surgery    . Joint replacement    . Hernia repair    . Coronary angioplasty with stent placement  2008    stent to SVG to OM   Social History:   reports that he quit smoking about 47 years ago. His smoking use included Cigarettes. He has a 11.25 pack-year smoking history. He has never used smokeless tobacco. He reports that he does not drink alcohol or use illicit drugs.  Family History  Problem Relation Age of Onset  . Diabetes Father   . Other Mother     PAD with amputations    Medications: Patient's Medications  New Prescriptions   No medications on file  Previous Medications    ACETAMINOPHEN (TYLENOL) 325 MG TABLET    Take 2 tablets (650 mg total) by mouth 2 (two) times daily. DO NOT EXCEED 4 TABLETS DAILY   ALLOPURINOL (ZYLOPRIM) 100 MG TABLET    Take 50 mg by mouth daily.    AMOXICILLIN (AMOXIL) 500 MG CAPSULE    Take 500 mg by mouth. Take 4 caps prior to dential   APIXABAN (ELIQUIS) 2.5 MG TABS TABLET    Take 1 tablet (2.5 mg total) by mouth 2 (two) times daily.   CHOLECALCIFEROL (VITAMIN D) 400 UNITS CAPSULE    Take 400 Units by mouth 2 (two) times daily.    FUROSEMIDE (LASIX) 20 MG TABLET    Take 20 mg by mouth 2 (two) times daily.   IBUPROFEN (ADVIL,MOTRIN) 400 MG TABLET    Take 400 mg by mouth daily as needed.   IPRATROPIUM (ATROVENT) 0.06 % NASAL SPRAY    One puff in each nostril before each meal   LEVOTHYROXINE (SYNTHROID, LEVOTHROID) 100 MCG TABLET    Take 100 mcg by mouth daily before breakfast.   LIDOCAINE (XYLOCAINE) 2 % SOLUTION    Use as directed 20 mLs in the mouth or throat 3 (three) times daily before meals.   LORAZEPAM (ATIVAN) 0.5 MG TABLET    Take one tablet by mouth at bedtime   LOSARTAN (COZAAR) 100 MG TABLET    Take 1 tablet (100 mg total) by mouth daily.   METOPROLOL TARTRATE (LOPRESSOR) 25 MG TABLET    Take 3 tablets (75 mg total) by mouth 2 (two) times daily.   NITROGLYCERIN (NITROSTAT) 0.4 MG SL TABLET    Place 0.4 mg under the tongue every 5 (five) minutes as needed for chest pain.    OLOPATADINE (PATANOL) 0.1 % OPHTHALMIC SOLUTION    One drop in each eye twice daily to help conjunctival irritation   POLYETHYLENE GLYCOL (MIRALAX / GLYCOLAX) PACKET    Take 17 g by mouth daily.   POLYVINYL ALCOHOL (LIQUIFILM TEARS) 1.4 % OPHTHALMIC SOLUTION    Place 1 drop into both eyes. One drop in both eyes twice daily as needed for dry eyes   POTASSIUM CHLORIDE (MICRO-K) 10 MEQ CR CAPSULE    Take 10 mEq by mouth 2 (two) times daily.  TRIAMCINOLONE ACETONIDE (KENALOG IN ORABASE MT)    Use as directed 5 mg in the mouth or throat.   ZOLPIDEM (AMBIEN) 10 MG  TABLET    Take 5 mg by mouth at bedtime as needed for sleep.   Modified Medications   No medications on file  Discontinued Medications   No medications on file     Physical Exam: Physical Exam  Constitutional: He is oriented to person, place, and time. He appears well-developed and well-nourished. No distress.  HENT:  Severe hearing loss. Bilateral aides.Right facial droop.    Eyes:  Corrective lenses. Increased exposure of the right cornea due to post CVA facial droop.  Neck: No JVD present. No tracheal deviation present. No thyromegaly present.  Cardiovascular: Normal rate.  Frequent extrasystoles are present.  Murmur heard. Pacemaker left upper chest. Systolic murmur 2-4/4  Pulmonary/Chest: Effort normal. He has no wheezes. He has rales.  dry rales posterior lung bases.   Abdominal: Soft. Bowel sounds are normal. He exhibits no distension and no mass. There is no tenderness.  Musculoskeletal: Normal range of motion. He exhibits edema. He exhibits no tenderness.  Unstable gait. Using 4 wheel walker and w/c. Weaker on the right side. Hx of the right total knee replacement. Edema is not apparent today.  C/o lower back aches with movement  Lymphadenopathy:    He has no cervical adenopathy.  Neurological: He is alert and oriented to person, place, and time. He displays normal reflexes. No cranial nerve deficit. He exhibits normal muscle tone. Coordination normal.  Slurred speech. Right side weakness.   Skin: Skin is warm and dry. No rash noted. No erythema. No pallor.  Psychiatric: He has a normal mood and affect. His behavior is normal. Judgment and thought content normal.  Nursing note and vitals reviewed.   Filed Vitals:   05/29/14 1121  BP: 152/74  Pulse: 74  Temp: 97.2 F (36.2 C)  TempSrc: Tympanic  Resp: 20      Labs reviewed: Basic Metabolic Panel:  Recent Labs  08/29/13  12/23/13 12/31/13 04/18/14  NA 137  < > 131* 136* 134*  K 4.6  < > 4.3 4.4 4.6  BUN  23*  < > 19 19 24*  CREATININE 1.1  < > 0.8 1.0 1.0  TSH 0.62  --   --  2.10  --   < > = values in this interval not displayed. Liver Function Tests: No results for input(s): AST, ALT, ALKPHOS, BILITOT, PROT, ALBUMIN in the last 8760 hours. No results for input(s): LIPASE, AMYLASE in the last 8760 hours. No results for input(s): AMMONIA in the last 8760 hours. CBC:  Recent Labs  10/29/13 12/23/13  WBC 6.8 12.2  HGB 15.6 15.0  HCT 45 43  PLT 252 174   Lipid Panel: No results for input(s): CHOL, HDL, LDLCALC, TRIG, CHOLHDL, LDLDIRECT in the last 8760 hours.   Past Procedures:  04/30/2013 CLINICAL DATA: Stroke. Ex-smoker. EXAM: CHEST 2 VIEW . IMPRESSION: Minimal bibasilar linear atelectasis.   04/30/2013 CLINICAL DATA: TRANSIENT ISCHEMIC ATTACK EXAM: CT HEAD WITHOUT CONTRAST IMPRESSION: Findings consistent with an area of subacute to chronic lacunar infarction left MCA distribution. Involutional and chronic changes also appreciated. There is no evidence of acute abnormalities.   05/01/2013 EXAM: MRI HEAD WITHOUT CONTRAST MRA HEAD WITHOUT CONTRAST IMPRESSION: 1. Small acute/subacute infarct left corona radiata. No mass effect or hemorrhage. 2. Three superimposed punctate acute lacunar infarcts (right caudate nucleus, left occipital pole, left left parietal lobe). These could  reflect synchronous small vessel ischemia rather than a recent embolic event. 3. Underlying chronic small vessel ischemia. 4. Intracranial atherosclerosis. Fusiform aneurysmal enlargement of the left ICA (cavernous segment) and right ICA terminus. No anterior circulation major branch occlusion. 5. Atherosclerosis in stenosis of the distal right vertebral artery which appears hemodynamically significant. Other posterior circulation atherosclerosis without stenosis.   Mr Jodene Nam Head/brain Wo Cm  2/25/2015EXAM: MRI HEAD WITHOUT CONTRAST MRA HEAD WITHOUT CONTRAST IMPRESSION: 1. Small acute/subacute  infarct left corona radiata. No mass effect or hemorrhage. 2. Three superimposed punctate acute lacunar infarcts (right caudate nucleus, left occipital pole, left left parietal lobe). These could reflect synchronous small vessel ischemia rather than a recent embolic event. 3. Underlying chronic small vessel ischemia. 4. Intracranial atherosclerosis. Fusiform aneurysmal enlargement of the left ICA (cavernous segment) and right ICA terminus. No anterior circulation major branch occlusion. 5. Atherosclerosis in stenosis of the distal right vertebral artery which appears hemodynamically significant. Other posterior circulation atherosclerosis without stenosis.   EKG Atrial fibrillation Right bundle branch block ST depr, consider ischemia, anterolateral lds Baseline wander in lead(s) II III aVR aVL aVF V3   Assessment/Plan Urgency of urination It seems worse over the period of time-will update UA and CBC. F/u Urology.    Lower back pain 05/07/14 X-ray Lumbar psine, sacrum, and coccyx: mild degree of osteoporosis, moderate spondylosis, mild osteoarthritis. PT to eval and tx. OTC Tylenol prn   Atrial fibrillation Rate controlled, takes Metoprolol 75mg  bid. Eliquis for VTE risk reduction.      HTN (hypertension) Metoprolol 75 mg twice a day and Losartan 100mg  daily and Furosemide 80mg  daily-controlled     Hypothyroidism takes Levothyroxine 23mcg, TSH 0.617 08/29/13 and 2.097 12/31/13    Chronic systolic congestive heart failure 03/24/14 CXR minimal bibasilar atelectasis.  Clinical compensated taking Furosemide     Gout No flare ups. Takes Allopurinol 50mg  daily.     Osteoarthritis Lower back pain with movement-PT to eval tx.    Edema Trace in ankles.    Adjustment disorder with mixed anxiety and depressed mood Post CVA depression and anxiety 02/20/14 Pharm prn Ambien not used, reduce Lorazepam 0.25mg  qhs.  --stable.     Constipation Stable since 04/15/14 MiraLax  prn      Family/ Staff Communication: observe the patient.   Goals of Care: AL  Labs/tests ordered: CBC, CMP, UA C/S

## 2014-05-29 NOTE — Assessment & Plan Note (Signed)
Lower back pain with movement-PT to eval tx.

## 2014-05-30 DIAGNOSIS — I10 Essential (primary) hypertension: Secondary | ICD-10-CM | POA: Diagnosis not present

## 2014-05-30 DIAGNOSIS — N400 Enlarged prostate without lower urinary tract symptoms: Secondary | ICD-10-CM | POA: Diagnosis not present

## 2014-05-30 LAB — HEPATIC FUNCTION PANEL
ALK PHOS: 93 U/L (ref 25–125)
ALT: 10 U/L (ref 10–40)
AST: 14 U/L (ref 14–40)

## 2014-05-30 LAB — BASIC METABOLIC PANEL
BUN: 23 mg/dL — AB (ref 4–21)
CREATININE: 0.9 mg/dL (ref 0.6–1.3)
Glucose: 110 mg/dL
POTASSIUM: 4.1 mmol/L (ref 3.4–5.3)
SODIUM: 134 mmol/L — AB (ref 137–147)

## 2014-05-30 LAB — CBC AND DIFFERENTIAL
HCT: 41 % (ref 41–53)
HEMOGLOBIN: 13.9 g/dL (ref 13.5–17.5)
Platelets: 234 10*3/uL (ref 150–399)
WBC: 7.2 10*3/mL

## 2014-06-12 ENCOUNTER — Encounter: Payer: Self-pay | Admitting: Nurse Practitioner

## 2014-06-12 ENCOUNTER — Non-Acute Institutional Stay: Payer: Medicare Other | Admitting: Nurse Practitioner

## 2014-06-12 DIAGNOSIS — I482 Chronic atrial fibrillation, unspecified: Secondary | ICD-10-CM

## 2014-06-12 DIAGNOSIS — M5440 Lumbago with sciatica, unspecified side: Secondary | ICD-10-CM | POA: Diagnosis not present

## 2014-06-12 DIAGNOSIS — I5022 Chronic systolic (congestive) heart failure: Secondary | ICD-10-CM | POA: Diagnosis not present

## 2014-06-12 DIAGNOSIS — I63512 Cerebral infarction due to unspecified occlusion or stenosis of left middle cerebral artery: Secondary | ICD-10-CM | POA: Diagnosis not present

## 2014-06-12 DIAGNOSIS — I10 Essential (primary) hypertension: Secondary | ICD-10-CM

## 2014-06-12 DIAGNOSIS — K5900 Constipation, unspecified: Secondary | ICD-10-CM | POA: Diagnosis not present

## 2014-06-12 DIAGNOSIS — R252 Cramp and spasm: Secondary | ICD-10-CM

## 2014-06-12 DIAGNOSIS — F4323 Adjustment disorder with mixed anxiety and depressed mood: Secondary | ICD-10-CM | POA: Diagnosis not present

## 2014-06-12 DIAGNOSIS — E871 Hypo-osmolality and hyponatremia: Secondary | ICD-10-CM

## 2014-06-12 DIAGNOSIS — M1000 Idiopathic gout, unspecified site: Secondary | ICD-10-CM

## 2014-06-12 DIAGNOSIS — E039 Hypothyroidism, unspecified: Secondary | ICD-10-CM | POA: Diagnosis not present

## 2014-06-12 NOTE — Assessment & Plan Note (Signed)
Rate controlled, takes Metoprolol 75mg  bid. Eliquis for VTE risk reduction.

## 2014-06-12 NOTE — Assessment & Plan Note (Signed)
Stable since 04/15/14 MiraLax prn

## 2014-06-12 NOTE — Assessment & Plan Note (Signed)
Takes Eliquis bid.

## 2014-06-12 NOTE — Assessment & Plan Note (Signed)
05/07/14 X-ray Lumbar psine, sacrum, and coccyx: mild degree of osteoporosis, moderate spondylosis, mild osteoarthritis. PT to eval and tx. OTC Tylenol prn

## 2014-06-12 NOTE — Assessment & Plan Note (Signed)
Metoprolol 75 mg twice a day and Losartan 100mg  daily controlled

## 2014-06-12 NOTE — Progress Notes (Signed)
Patient ID: Jon [name], male   DOB: Jun 19, 1921, 81 y.o.   MRN: [id number]   Code Status: DNR  Allergies  Allergen Reactions  . Caduet [Amlodipine-Atorvastatin] Other (See Comments)    Weakness   . Crestor [Rosuvastatin] Other (See Comments)    Muscle pain/weakness  . Cymbalta [Duloxetine Hcl] Other (See Comments)    Excessive sedation  . Lipitor [Atorvastatin Calcium] Other (See Comments)    Muscle pain/weakness  . Lisinopril Other (See Comments)    Doesn't remember reaction  . Quinolones     Risk of rupture of AAA  . Simvastatin Other (See Comments)    Muscle pain/weakness    Chief Complaint  Patient presents with  . Medical Management of Chronic Issues  . Acute Visit    CHF, arthritic pain    HPI: Patient is a 81 y.o. male seen in the [facility] at [facility] today for evaluation of CHF, back pain,  and other chronic medical conditions.  Problem List Items Addressed This Visit    Atrial fibrillation (Chronic)    Rate controlled, takes Metoprolol 75mg  bid. Eliquis for VTE risk reduction.         HTN (hypertension) (Chronic)    Metoprolol 75 mg twice a day and Losartan 100mg  daily controlled         Hypothyroidism (Chronic)    takes Levothyroxine 47mcg, TSH 0.617 08/29/13 and 2.097 12/31/13. Update TSH       Chronic systolic congestive heart failure - Primary (Chronic)    03/24/14 CXR minimal bibasilar atelectasis.  Clinical compensated-the patient requests to dc am Furosemide-f/u BMP/BNP in 2 weks. Observe the patient.         Gout    No flare ups. Takes Allopurinol 50mg  daily.         Adjustment disorder with mixed anxiety and depressed mood    02/20/14 Pharm prn Ambien not used, reduce Lorazepam 0.25mg  qhs.        Acute ischemic left MCA stroke    Takes Eliquis bid.       Foot spasms    Prn Tizanidine 2mg  q6h available to him.       Hyponatremia    12/23/13 Na 131 12/31/13 Na 136 05/30/14 Na 134 06/12/14 dc Furosemide, f/u BMP  in 2 weeks.        Constipation    Stable since 04/15/14 MiraLax prn        Lower back pain    05/07/14 X-ray Lumbar psine, sacrum, and coccyx: mild degree of osteoporosis, moderate spondylosis, mild osteoarthritis. PT to eval and tx. OTC Tylenol prn          Review of Systems:  Review of Systems  Constitutional: Negative for fever, chills and diaphoresis.  HENT: Positive for hearing loss. Negative for congestion, ear discharge, ear pain, nosebleeds, sore throat and tinnitus.   Eyes: Negative for photophobia, pain, discharge and redness.  Respiratory: Negative for cough, shortness of breath, wheezing and stridor.   Cardiovascular: Positive for leg swelling. Negative for chest pain and palpitations.       Trace BLE  Gastrointestinal: Negative for nausea, vomiting, abdominal pain, diarrhea, constipation and blood in stool.  Endocrine: Negative for polydipsia.  Genitourinary: Positive for frequency. Negative for dysuria, urgency, hematuria and flank pain.  Musculoskeletal: Positive for back pain. Negative for myalgias and neck pain.  Skin: Negative for rash.  Allergic/Immunologic: Negative for environmental allergies.  Neurological: Negative for dizziness, tremors, seizures, weakness and headaches.  Hematological: Bruises/bleeds easily.  Psychiatric/Behavioral: Negative for suicidal ideas and hallucinations. The patient is nervous/anxious.     Past Medical History  Diagnosis Date  . Hypertension   . Coronary artery disease   . Arthritis   . Cataract   . Anxiety   . Blood transfusion without reported diagnosis   . Hyperlipidemia   . Heart murmur   . Allergy   . Thyroid disease   . PAF (paroxysmal atrial fibrillation)     coumadin  . History of abdominal aortic aneurysm   . History of echocardiogram 12/2008    EF >55%; mild concentric LVH; mild MR; mild-mod TR; mild AV regurg;   . History of nuclear stress test 12/2010    lexiscan; low risk; compared to prior study,  perfusion improved  . History of Doppler ultrasound 05/22/2012    LEAs; R anterior tibial artery appeared occluded; L posterior tibial shows short segment of occlusive ds  . History of Doppler ultrasound 05/22/2012    Abdominal Aortic Doppler; slight increase in fusiform aneurysm   . Stroke   . Adjustment disorder with mixed anxiety and depressed mood 04/18/2013    Post CVA depression and anxiety   . Abnormality of gait 04/18/2013  . 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 Increased exposure of the right cornea.   . Pruritic condition 01/24/2013  . Memory change 01/24/2013  . Abnormal PFTs 10/30/2012    Followed in Pulmonary clinic/ [facility]/ [facility]  - PFT's 10/22/12 VC  55% no obst, DLCO 50%  - PFT's 12/20/2012 VC 83% and no obst, dLCO 55%  -11/08/2012  Walked RA x 3 laps @ 185 ft each stopped due to  End of study, not desat -11/08/12 esr 10    . Impotence of organic origin 10/03/2000  . Osteoarthrosis, unspecified whether generalized or localized, unspecified site 02/05/2003  . Hypertrophy of prostate with urinary obstruction and other lower urinary tract symptoms (LUTS) 04/28/2004  . Gout, unspecified 11/03/2004  . Acute on chronic systolic congestive heart failure, NYHA class 2 03/16/2006    BNP 376.9 05/28/13   . Atherosclerosis of renal artery 10/18/2006  . Unspecified vitamin D deficiency 10/18/2006  . Obstructive sleep apnea (adult) (pediatric) 07/23/2008  . Major depressive disorder, single episode, unspecified 03/05/2009  . Basal cell carcinoma of skin of other and unspecified parts of face 12/24/2009  . Internal hemorrhoids without mention of complication 70/35/0093  . Muscle weakness (generalized) 03/10/2011  . Pain in joint, lower leg 08/04/2011  . Nocturia 10/27/2011  . Aortic aneurysm of unspecified site without mention of rupture 10/27/2011  . Fall 03/04/2011  . Hyponatremia 03/04/2011  . TIA (transient ischemic attack) 04/30/2013  . Fatigue 04/18/2013  . Speech  and language deficit due to old cerebral infarction 04/18/2013    Slurred speech   . CVA - Rt brain stroke 12/14 02/18/2013    02/19/13 angiography CT head:  Diffuse atherosclerotic irregularity and plaque formation of the distal right common carotid artery and proximal internal carotid artery without significant stenosis. Plaque ulceration is present and is a source of emboli. A small right thalamic CVA    . Dyslipidemia 11/03/2004  . Type II or unspecified type diabetes mellitus without mention of complication, not stated as uncontrolled 11/24/2004  . Spinal stenosis in cervical region 12/15/2004  . Right bundle branch block 04/19/2006  . Cardiac pacemaker in situ- MDT 10/11 03/18/2010    Medtronic revo implant in October 2011. Severe sinus bradycardia in the 30s, but not truly  pacemaker dependent   . Long term (current) use of anticoagulants 03/17/2011  . Hypothyroidism 03/04/2011  . HTN (hypertension) 03/04/2011  . Atrial fibrillation 03/04/2011     He was on coumadin until his stroke last December and was switched to apixaban 2.$RemoveBefor'5mg'[id number]$  daily which he has been taking faithfully without reported side effects.    . SSS (sick sinus syndrome) 04/08/2014   Past Surgical History  Procedure Laterality Date  . Coronary artery bypass graft  1989  . Pacemaker insertion  2011  . Insert / replace / remove pacemaker    . Eye surgery    . Joint replacement    . Hernia repair    . Coronary angioplasty with stent placement  2008    stent to SVG to OM   Social History:   reports that he quit smoking about 47 years ago. His smoking use included Cigarettes. He has a 11.25 pack-year smoking history. He has never used smokeless tobacco. He reports that he does not drink alcohol or use illicit drugs.  Family History  Problem Relation Age of Onset  . Diabetes Father   . Other Mother     PAD with amputations    Medications: Patient's Medications  New Prescriptions   No medications on file  Previous  Medications   ACETAMINOPHEN (TYLENOL) 325 MG TABLET    Take 2 tablets (650 mg total) by mouth 2 (two) times daily. DO NOT EXCEED 4 TABLETS DAILY   ALLOPURINOL (ZYLOPRIM) 100 MG TABLET    Take 50 mg by mouth daily.    AMOXICILLIN (AMOXIL) 500 MG CAPSULE    Take 500 mg by mouth. Take 4 caps prior to dential   APIXABAN (ELIQUIS) 2.5 MG TABS TABLET    Take 1 tablet (2.5 mg total) by mouth 2 (two) times daily.   CHOLECALCIFEROL (VITAMIN D) 400 UNITS CAPSULE    Take 400 Units by mouth 2 (two) times daily.    FUROSEMIDE (LASIX) 20 MG TABLET    Take 20 mg by mouth 2 (two) times daily.   IBUPROFEN (ADVIL,MOTRIN) 400 MG TABLET    Take 400 mg by mouth daily as needed.   IPRATROPIUM (ATROVENT) 0.06 % NASAL SPRAY    One puff in each nostril before each meal   LEVOTHYROXINE (SYNTHROID, LEVOTHROID) 100 MCG TABLET    Take 100 mcg by mouth daily before breakfast.   LIDOCAINE (XYLOCAINE) 2 % SOLUTION    Use as directed 20 mLs in the mouth or throat 3 (three) times daily before meals.   LORAZEPAM (ATIVAN) 0.5 MG TABLET    Take one tablet by mouth at bedtime   LOSARTAN (COZAAR) 100 MG TABLET    Take 1 tablet (100 mg total) by mouth daily.   METOPROLOL TARTRATE (LOPRESSOR) 25 MG TABLET    Take 3 tablets (75 mg total) by mouth 2 (two) times daily.   NITROGLYCERIN (NITROSTAT) 0.4 MG SL TABLET    Place 0.4 mg under the tongue every 5 (five) minutes as needed for chest pain.    OLOPATADINE (PATANOL) 0.1 % OPHTHALMIC SOLUTION    One drop in each eye twice daily to help conjunctival irritation   POLYETHYLENE GLYCOL (MIRALAX / GLYCOLAX) PACKET    Take 17 g by mouth daily.   POLYVINYL ALCOHOL (LIQUIFILM TEARS) 1.4 % OPHTHALMIC SOLUTION    Place 1 drop into both eyes. One drop in both eyes twice daily as needed for dry eyes   POTASSIUM CHLORIDE (MICRO-K) 10 MEQ CR CAPSULE  Take 10 mEq by mouth 2 (two) times daily.   TRIAMCINOLONE ACETONIDE (KENALOG IN ORABASE MT)    Use as directed 5 mg in the mouth or throat.   ZOLPIDEM  (AMBIEN) 10 MG TABLET    Take 5 mg by mouth at bedtime as needed for sleep.   Modified Medications   No medications on file  Discontinued Medications   No medications on file     Physical Exam: Physical Exam  Constitutional: He is oriented to person, place, and time. He appears well-developed and well-nourished. No distress.  HENT:  Severe hearing loss. Bilateral aides.Right facial droop.    Eyes:  Corrective lenses. Increased exposure of the right cornea due to post CVA facial droop.  Neck: No JVD present. No tracheal deviation present. No thyromegaly present.  Cardiovascular: Normal rate.  Frequent extrasystoles are present.  Murmur heard. Pacemaker left upper chest. Systolic murmur 0-0/1  Pulmonary/Chest: Effort normal. He has no wheezes. He has rales.  dry rales posterior lung bases.   Abdominal: Soft. Bowel sounds are normal. He exhibits no distension and no mass. There is no tenderness.  Musculoskeletal: Normal range of motion. He exhibits edema. He exhibits no tenderness.  Unstable gait. Using 4 wheel walker and w/c. Weaker on the right side. Hx of the right total knee replacement. Edema is not apparent today.  C/o lower back aches with movement  Lymphadenopathy:    He has no cervical adenopathy.  Neurological: He is alert and oriented to person, place, and time. He displays normal reflexes. No cranial nerve deficit. He exhibits normal muscle tone. Coordination normal.  Slurred speech. Right side weakness.   Skin: Skin is warm and dry. No rash noted. No erythema. No pallor.  Psychiatric: He has a normal mood and affect. His behavior is normal. Judgment and thought content normal.  Nursing note and vitals reviewed.   Filed Vitals:   06/12/14 1252  BP: 130/80  Pulse: 70  Temp: 97.3 F (36.3 C)  TempSrc: Tympanic  Resp: 18      Labs reviewed: Basic Metabolic Panel:  Recent Labs  08/29/13  12/31/13 04/18/14 05/30/14  NA 137  < > 136* 134* 134*  K 4.6  < > 4.4  4.6 4.1  BUN 23*  < > 19 24* 23*  CREATININE 1.1  < > 1.0 1.0 0.9  TSH 0.62  --  2.10  --   --   < > = values in this interval not displayed. Liver Function Tests:  Recent Labs  05/30/14  AST 14  ALT 10  ALKPHOS 93   No results for input(s): LIPASE, AMYLASE in the last 8760 hours. No results for input(s): AMMONIA in the last 8760 hours. CBC:  Recent Labs  10/29/13 12/23/13 05/30/14  WBC 6.8 12.2 7.2  HGB 15.6 15.0 13.9  HCT 45 43 41  PLT 252 174 234   Lipid Panel: No results for input(s): CHOL, HDL, LDLCALC, TRIG, CHOLHDL, LDLDIRECT in the last 8760 hours.   Past Procedures:  04/30/2013 CLINICAL DATA: Stroke. Ex-smoker. EXAM: CHEST 2 VIEW . IMPRESSION: Minimal bibasilar linear atelectasis.   04/30/2013 CLINICAL DATA: TRANSIENT ISCHEMIC ATTACK EXAM: CT HEAD WITHOUT CONTRAST IMPRESSION: Findings consistent with an area of subacute to chronic lacunar infarction left MCA distribution. Involutional and chronic changes also appreciated. There is no evidence of acute abnormalities.   05/01/2013 EXAM: MRI HEAD WITHOUT CONTRAST MRA HEAD WITHOUT CONTRAST IMPRESSION: 1. Small acute/subacute infarct left corona radiata. No mass effect or hemorrhage. 2. Three  superimposed punctate acute lacunar infarcts (right caudate nucleus, left occipital pole, left left parietal lobe). These could reflect synchronous small vessel ischemia rather than a recent embolic event. 3. Underlying chronic small vessel ischemia. 4. Intracranial atherosclerosis. Fusiform aneurysmal enlargement of the left ICA (cavernous segment) and right ICA terminus. No anterior circulation major branch occlusion. 5. Atherosclerosis in stenosis of the distal right vertebral artery which appears hemodynamically significant. Other posterior circulation atherosclerosis without stenosis.   Mr Jodene Nam Head/brain Wo Cm  2/25/2015EXAM: MRI HEAD WITHOUT CONTRAST MRA HEAD WITHOUT CONTRAST IMPRESSION: 1. Small  acute/subacute infarct left corona radiata. No mass effect or hemorrhage. 2. Three superimposed punctate acute lacunar infarcts (right caudate nucleus, left occipital pole, left left parietal lobe). These could reflect synchronous small vessel ischemia rather than a recent embolic event. 3. Underlying chronic small vessel ischemia. 4. Intracranial atherosclerosis. Fusiform aneurysmal enlargement of the left ICA (cavernous segment) and right ICA terminus. No anterior circulation major branch occlusion. 5. Atherosclerosis in stenosis of the distal right vertebral artery which appears hemodynamically significant. Other posterior circulation atherosclerosis without stenosis.   EKG Atrial fibrillation Right bundle branch block ST depr, consider ischemia, anterolateral lds Baseline wander in lead(s) II III aVR aVL aVF V3   Assessment/Plan Chronic systolic congestive heart failure 03/24/14 CXR minimal bibasilar atelectasis.  Clinical compensated-the patient requests to dc am Furosemide-f/u BMP/BNP in 2 weks. Observe the patient.      Lower back pain 05/07/14 X-ray Lumbar psine, sacrum, and coccyx: mild degree of osteoporosis, moderate spondylosis, mild osteoarthritis. PT to eval and tx. OTC Tylenol prn    Hyponatremia 12/23/13 Na 131 12/31/13 Na 136 05/30/14 Na 134 06/12/14 dc Furosemide, f/u BMP in 2 weeks.     Hypothyroidism takes Levothyroxine 9mcg, TSH 0.617 08/29/13 and 2.097 12/31/13. Update TSH    Gout No flare ups. Takes Allopurinol 50mg  daily.      HTN (hypertension) Metoprolol 75 mg twice a day and Losartan 100mg  daily controlled      Acute ischemic left MCA stroke Takes Eliquis bid.    Adjustment disorder with mixed anxiety and depressed mood 02/20/14 Pharm prn Ambien not used, reduce Lorazepam 0.25mg  qhs.     Constipation Stable since 04/15/14 MiraLax prn     Atrial fibrillation Rate controlled, takes Metoprolol 75mg  bid. Eliquis for VTE risk reduction.        Foot spasms Prn Tizanidine 2mg  q6h available to him.      Family/ Staff Communication: observe the patient.   Goals of Care: AL  Labs/tests ordered: BMP/BNP/TSH

## 2014-06-12 NOTE — Assessment & Plan Note (Signed)
takes Levothyroxine 39mcg, TSH 0.617 08/29/13 and 2.097 12/31/13. Update TSH

## 2014-06-12 NOTE — Assessment & Plan Note (Signed)
12/23/13 Na 131 12/31/13 Na 136 05/30/14 Na 134 06/12/14 dc Furosemide, f/u BMP in 2 weeks.

## 2014-06-12 NOTE — Assessment & Plan Note (Signed)
No flare ups. Takes Allopurinol 50mg  daily.

## 2014-06-12 NOTE — Assessment & Plan Note (Signed)
02/20/14 Pharm prn Ambien not used, reduce Lorazepam 0.25mg  qhs.

## 2014-06-12 NOTE — Assessment & Plan Note (Addendum)
03/24/14 CXR minimal bibasilar atelectasis.  Clinical compensated-the patient requests to dc am Furosemide-f/u BMP/BNP in 2 weks. Observe the patient.

## 2014-06-12 NOTE — Assessment & Plan Note (Signed)
Prn Tizanidine 2mg  q6h available to him.

## 2014-06-20 DIAGNOSIS — N3941 Urge incontinence: Secondary | ICD-10-CM | POA: Diagnosis not present

## 2014-06-20 DIAGNOSIS — R351 Nocturia: Secondary | ICD-10-CM | POA: Diagnosis not present

## 2014-06-23 ENCOUNTER — Other Ambulatory Visit: Payer: Self-pay | Admitting: Sports Medicine

## 2014-06-23 DIAGNOSIS — S32010S Wedge compression fracture of first lumbar vertebra, sequela: Secondary | ICD-10-CM

## 2014-06-23 DIAGNOSIS — M545 Low back pain: Secondary | ICD-10-CM | POA: Diagnosis not present

## 2014-06-25 ENCOUNTER — Inpatient Hospital Stay
Admission: RE | Admit: 2014-06-25 | Discharge: 2014-06-25 | Disposition: A | Discharge status: Home / Self Care | Payer: Self-pay | Source: Ambulatory Visit | Attending: Sports Medicine | Admitting: Sports Medicine

## 2014-06-25 ENCOUNTER — Ambulatory Visit
Admission: RE | Admit: 2014-06-25 | Discharge: 2014-06-25 | Disposition: A | Discharge status: Home / Self Care | Payer: Medicare Other | Source: Ambulatory Visit | Attending: Sports Medicine | Admitting: Sports Medicine

## 2014-06-25 ENCOUNTER — Other Ambulatory Visit: Payer: Self-pay | Admitting: Sports Medicine

## 2014-06-25 DIAGNOSIS — S32000A Wedge compression fracture of unspecified lumbar vertebra, initial encounter for closed fracture: Secondary | ICD-10-CM

## 2014-06-25 DIAGNOSIS — M5137 Other intervertebral disc degeneration, lumbosacral region: Secondary | ICD-10-CM | POA: Diagnosis not present

## 2014-06-25 DIAGNOSIS — S32010S Wedge compression fracture of first lumbar vertebra, sequela: Secondary | ICD-10-CM

## 2014-06-26 DIAGNOSIS — I509 Heart failure, unspecified: Secondary | ICD-10-CM | POA: Diagnosis not present

## 2014-06-26 DIAGNOSIS — E139 Other specified diabetes mellitus without complications: Secondary | ICD-10-CM | POA: Diagnosis not present

## 2014-06-26 DIAGNOSIS — E039 Hypothyroidism, unspecified: Secondary | ICD-10-CM | POA: Diagnosis not present

## 2014-06-27 LAB — BASIC METABOLIC PANEL
BUN: 26 mg/dL — AB (ref 4–21)
CREATININE: 0.9 mg/dL (ref 0.6–1.3)
GLUCOSE: 110 mg/dL
POTASSIUM: 4.5 mmol/L (ref 3.4–5.3)
SODIUM: 136 mmol/L — AB (ref 137–147)

## 2014-06-27 LAB — TSH: TSH: 4.55 u[IU]/mL (ref 0.41–5.90)

## 2014-07-03 ENCOUNTER — Encounter: Payer: Self-pay | Admitting: Internal Medicine

## 2014-07-03 ENCOUNTER — Non-Acute Institutional Stay: Payer: Medicare Other | Admitting: Internal Medicine

## 2014-07-03 ENCOUNTER — Other Ambulatory Visit: Payer: Self-pay | Admitting: Nurse Practitioner

## 2014-07-03 VITALS — BP 150/82 | HR 64 | Wt 161.0 lb

## 2014-07-03 DIAGNOSIS — N189 Chronic kidney disease, unspecified: Secondary | ICD-10-CM | POA: Diagnosis not present

## 2014-07-03 DIAGNOSIS — I5022 Chronic systolic (congestive) heart failure: Secondary | ICD-10-CM

## 2014-07-03 DIAGNOSIS — M5440 Lumbago with sciatica, unspecified side: Secondary | ICD-10-CM

## 2014-07-03 DIAGNOSIS — M25562 Pain in left knee: Secondary | ICD-10-CM | POA: Diagnosis not present

## 2014-07-03 DIAGNOSIS — I63512 Cerebral infarction due to unspecified occlusion or stenosis of left middle cerebral artery: Secondary | ICD-10-CM | POA: Diagnosis not present

## 2014-07-03 DIAGNOSIS — E1159 Type 2 diabetes mellitus with other circulatory complications: Secondary | ICD-10-CM | POA: Diagnosis not present

## 2014-07-03 DIAGNOSIS — R5382 Chronic fatigue, unspecified: Secondary | ICD-10-CM

## 2014-07-03 DIAGNOSIS — I69328 Other speech and language deficits following cerebral infarction: Secondary | ICD-10-CM

## 2014-07-03 DIAGNOSIS — R0600 Dyspnea, unspecified: Secondary | ICD-10-CM | POA: Insufficient documentation

## 2014-07-03 DIAGNOSIS — I69928 Other speech and language deficits following unspecified cerebrovascular disease: Secondary | ICD-10-CM | POA: Diagnosis not present

## 2014-07-03 DIAGNOSIS — E1122 Type 2 diabetes mellitus with diabetic chronic kidney disease: Secondary | ICD-10-CM

## 2014-07-03 DIAGNOSIS — E1129 Type 2 diabetes mellitus with other diabetic kidney complication: Secondary | ICD-10-CM

## 2014-07-03 DIAGNOSIS — N182 Chronic kidney disease, stage 2 (mild): Secondary | ICD-10-CM | POA: Insufficient documentation

## 2014-07-03 DIAGNOSIS — E039 Hypothyroidism, unspecified: Secondary | ICD-10-CM

## 2014-07-03 DIAGNOSIS — I10 Essential (primary) hypertension: Secondary | ICD-10-CM | POA: Diagnosis not present

## 2014-07-03 DIAGNOSIS — E871 Hypo-osmolality and hyponatremia: Secondary | ICD-10-CM

## 2014-07-03 HISTORY — DX: Type 2 diabetes mellitus with diabetic chronic kidney disease: E11.22

## 2014-07-03 HISTORY — DX: Type 2 diabetes mellitus with diabetic chronic kidney disease: N18.2

## 2014-07-03 HISTORY — DX: Type 2 diabetes mellitus with other diabetic kidney complication: E11.29

## 2014-07-03 NOTE — Progress Notes (Signed)
Patient ID: Jon [name], male   DOB: 03-01-1922, 81 y.o.   MRN: [id number]    [place] Room Number: [id number]  Place of Service: Clinic (12)     Allergies  Allergen Reactions  . Caduet [Amlodipine-Atorvastatin] Other (See Comments)    Weakness   . Crestor [Rosuvastatin] Other (See Comments)    Muscle pain/weakness  . Cymbalta [Duloxetine Hcl] Other (See Comments)    Excessive sedation  . Lipitor [Atorvastatin Calcium] Other (See Comments)    Muscle pain/weakness  . Lisinopril Other (See Comments)    Doesn't remember reaction  . Quinolones     Risk of rupture of AAA  . Simvastatin Other (See Comments)    Muscle pain/weakness    Chief Complaint  Patient presents with  . Medical Management of Chronic Issues    CHF, blood sugar, blood pressure, here with daughter Lesleigh Noe  . Back Pain    low back  . Fatigue    great decrease past 2 weeks  . Knee Pain    left knee pops  . Shortness of Breath    since Lasix has been decrease    HPI:  Patient complains of no energy.  Back pain is worse. He sleeps in a recliner due to his back pain. X-rays of the spine 05/07/2014 showed mild osteoporosis and spondylosis  He has increasing shortness of breath.  He has increasing cramps in the legs and feet. He wonders if his magnesium level to be low.  Complains of pain in his left knee. He has known osteoarthritis. He has seen Dr. Alfonso Ramus. He was told surgery was not an option for his knee. He uses Ultram for pain relief, but this is less effective lately.  Chronic systolic congestive heart failure: Compensated  Dyspnea: Chronic complaints of dyspnea on exertion. No worse than in the past.  Essential hypertension: Mild elevation in systolic blood pressure today. In general his blood pressures have been running normal nurse checks in the assisted living unit.  Type 2 diabetes mellitus with other circulatory complications: Patient has had mild elevations  in blood sugar, but diminished pulses. Currently he is not on medications for his diabetes. He uses dietary control.  Type 2 diabetes mellitus with diabetic chronic kidney disease: Patient has had several elevations in blood sugar consistent with early diabetes  CKD stage 2 due to type 2 diabetes mellitus: Patient has had mild elevations of blood sugars accompanied by occasional mild elevation BUN. Creatinine is generally normal. Diabetes is controlled by diet. He is not on medications for the diabetes.  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     Medications: Patient's Medications  New Prescriptions   No medications on file  Previous Medications   ACETAMINOPHEN (TYLENOL) 325 MG TABLET    Take 2 tablets (650 mg total) by mouth 2 (two) times daily. DO NOT EXCEED 4 TABLETS DAILY   ALLOPURINOL (ZYLOPRIM) 100 MG TABLET    Take 50 mg by mouth daily.    AMOXICILLIN (AMOXIL) 500 MG CAPSULE    Take 500 mg by mouth. Take 4 caps prior to dential   APIXABAN (ELIQUIS) 2.5 MG TABS TABLET    Take 1 tablet (2.5 mg total) by mouth 2 (two) times daily.   CHOLECALCIFEROL (VITAMIN D) 400 UNITS CAPSULE    Take 400 Units by mouth 2 (two) times daily.    IPRATROPIUM (ATROVENT) 0.06 % NASAL SPRAY    One  puff in each nostril before each meal   LEVOTHYROXINE (SYNTHROID, LEVOTHROID) 100 MCG TABLET    Take 100 mcg by mouth daily before breakfast.   LIDOCAINE (XYLOCAINE) 2 % SOLUTION    Use as directed 20 mLs in the mouth or throat 3 (three) times daily before meals.   LORAZEPAM (ATIVAN) 0.5 MG TABLET    Take one tablet by mouth at bedtime   LOSARTAN (COZAAR) 100 MG TABLET    Take 1 tablet (100 mg total) by mouth daily.   METOPROLOL TARTRATE (LOPRESSOR) 25 MG TABLET    Take 3 tablets (75 mg total) by mouth 2 (two) times daily.   NITROGLYCERIN (NITROSTAT) 0.4 MG SL TABLET    Place 0.4 mg under the tongue every 5 (five) minutes as needed for chest  pain.    OLOPATADINE (PATANOL) 0.1 % OPHTHALMIC SOLUTION    One drop in each eye twice daily to help conjunctival irritation   POLYETHYLENE GLYCOL (MIRALAX / GLYCOLAX) PACKET    Take 17 g by mouth daily.   POLYVINYL ALCOHOL (LIQUIFILM TEARS) 1.4 % OPHTHALMIC SOLUTION    Place 1 drop into both eyes. One drop in both eyes twice daily as needed for dry eyes   TRAMADOL (ULTRAM) 50 MG TABLET    Take 50 mg by mouth every 6 (six) hours as needed. for pain   TRIAMCINOLONE ACETONIDE (KENALOG IN ORABASE MT)    Use as directed 5 mg in the mouth or throat.  Modified Medications   No medications on file  Discontinued Medications   FUROSEMIDE (LASIX) 20 MG TABLET    Take 20 mg by mouth 2 (two) times daily.   IBUPROFEN (ADVIL,MOTRIN) 400 MG TABLET    Take 400 mg by mouth daily as needed.   POTASSIUM CHLORIDE (MICRO-K) 10 MEQ CR CAPSULE    Take 10 mEq by mouth 2 (two) times daily.   ZOLPIDEM (AMBIEN) 10 MG TABLET    Take 5 mg by mouth at bedtime as needed for sleep.      Review of Systems  Constitutional: Negative.   HENT: Positive for hearing loss and voice change (dysphonia since his stroke.). Negative for congestion, ear discharge, ear pain, rhinorrhea, sinus pressure and tinnitus.   Eyes: Negative.   Respiratory: Negative for cough, choking, chest tightness, shortness of breath and wheezing.   Cardiovascular: Negative for palpitations and leg swelling (sometimes, not apparent today. ).  Gastrointestinal: Negative for abdominal pain, diarrhea and abdominal distention.       Chokes on swallowing since his CVA. Episodes of fecal incontinence. Hx of hemorrhoids.  Endocrine:       Hypothyroid. Mild elevations in blood sugars, although not on every check. Not using medications to treat diabetes, but does avoid sweets in the diet.  Genitourinary: Positive for urgency and frequency.       Incontinent from time to time. Followed by Dr. Matilde Sprang.  Musculoskeletal: Positive for arthralgias and gait problem.  Negative for neck pain and neck stiffness.  Skin: Negative.  Negative for pallor and rash.  Allergic/Immunologic: Negative.   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 Recurrent CVA on 04/30/13 of the left MCA area  Hematological: Negative.   Psychiatric/Behavioral: Negative.     Filed Vitals:   07/03/14 1540  BP: 150/82  Pulse: 64  Weight: 161 lb (73.029 kg)  SpO2: 99%   Body mass index is 24.49 kg/(m^2).  Physical Exam  Constitutional: He is  oriented to person, place, and time. He appears well-developed and well-nourished. No distress.  HENT:  Severe hearing loss. Bilateral aides.Right facial droop.   Eyes:  Corrective lenses. Increased exposure of the right cornea due to post CVA facial droop.  Neck: No JVD present. No tracheal deviation present. No thyromegaly present.  Cardiovascular: Normal rate and normal heart sounds.  Frequent extrasystoles are present.  No murmur heard. Pacemaker left upper chest  Pulmonary/Chest: Effort normal. He has no wheezes. He has rales.  dry rales posterior lung bases.   Abdominal: Soft. Bowel sounds are normal. He exhibits no distension and no mass. There is no tenderness.  Musculoskeletal: Normal range of motion. He exhibits edema. He exhibits no tenderness.  Unstable gait. Using 4 wheel walker and w/c. Weaker on the right side. Hx of the right total knee replacement. Edema is not apparent today.   Lymphadenopathy:    He has no cervical adenopathy.  Neurological: He is alert and oriented to person, place, and time. He displays normal reflexes. No cranial nerve deficit. He exhibits normal muscle tone. Coordination normal.  Slurred speech. Right side weakness. C/o left foot spasm   Skin: Skin is warm and dry. No rash noted. No erythema. No pallor.  Psychiatric: He has a normal mood and affect. His behavior is normal. Judgment and thought content normal.  Nursing note and vitals  reviewed.    Labs reviewed: Lab on 07/03/2014  Component Date Value Ref Range Status  . Glucose 06/27/2014 110   Final  . BUN 06/27/2014 26* 4 - 21 mg/dL Final  . Creatinine 06/27/2014 0.9  0.6 - 1.3 mg/dL Final  . Potassium 06/27/2014 4.5  3.4 - 5.3 mmol/L Final  . Sodium 06/27/2014 136* 137 - 147 mmol/L Final  . TSH 06/27/2014 4.55  0.41 - 5.90 uIU/mL Final  Nursing Home on 06/12/2014  Component Date Value Ref Range Status  . Hemoglobin 05/30/2014 13.9  13.5 - 17.5 g/dL Final  . HCT 05/30/2014 41  41 - 53 % Final  . Platelets 05/30/2014 234  150 - 399 K/L Final  . WBC 05/30/2014 7.2   Final  . Glucose 05/30/2014 110   Final  . BUN 05/30/2014 23* 4 - 21 mg/dL Final  . Creatinine 05/30/2014 0.9  0.6 - 1.3 mg/dL Final  . Potassium 05/30/2014 4.1  3.4 - 5.3 mmol/L Final  . Sodium 05/30/2014 134* 137 - 147 mmol/L Final  . Alkaline Phosphatase 05/30/2014 93  25 - 125 U/L Final  . ALT 05/30/2014 10  10 - 40 U/L Final  . AST 05/30/2014 14  14 - 40 U/L Final  Lab on 04/21/2014  Component Date Value Ref Range Status  . Glucose 04/18/2014 99   Final  . BUN 04/18/2014 24* 4 - 21 mg/dL Final  . Creatinine 04/18/2014 1.0  0.6 - 1.3 mg/dL Final  . Potassium 04/18/2014 4.6  3.4 - 5.3 mmol/L Final  . Sodium 04/18/2014 134* 137 - 147 mmol/L Final  Office Visit on 04/08/2014  Component Date Value Ref Range Status  . Implantable Pulse Generator Manufa* 04/08/2014 Medtronic   Final  . Implantable Pulse Generator Model 04/08/2014 RVDR01 Revo MRI   Final  . Implantable Pulse Generator Serial* 04/08/2014 [id number] H   Final  . Lead Channel Setting Sensing Sensi* 04/08/2014 0.9   Final  . Lead Channel Setting Pacing Pulse * 04/08/2014 0.4   Final  . Lead Channel Setting Pacing Amplit* 04/08/2014 2.5   Final  . Zone Setting Type  Category 04/08/2014 VF   Final  . Zone Setting Type Category 04/08/2014 VT   Final  . Zone Setting Type Category 04/08/2014 VENTRICULAR_TACHYCARDIA_1   Final  . Zone  Setting Type Category 04/08/2014 VENTRICULAR_TACHYCARDIA_2   Final  . Zone Setting Detection Interval 04/08/2014 400   Final  . Zone Setting Type Category 04/08/2014 ATRIAL_FIBRILLATION   Final  . Zone Setting Type Category 04/08/2014 ATAF   Final  . Zone Setting Detection Interval 04/08/2014 350   Final  . Lead Channel Impedance Value 04/08/2014 416   Final  . Lead Channel Sensing Intrinsic Amp* 04/08/2014 1.0   Final  . Lead Channel Impedance Value 04/08/2014 456   Final  . Lead Channel Sensing Intrinsic Amp* 04/08/2014 10.5   Final  . Lead Channel Pacing Threshold Ampl* 04/08/2014 1.0   Final  . Lead Channel Pacing Threshold Puls* 04/08/2014 0.4   Final  . Battery Voltage 04/08/2014 2.97   Final  . Brady Statistic AP VP Percent 04/08/2014 1.3   Final  . Loletha Grayer Statistic AS VP Percent 04/08/2014 44.4   Final  . Loletha Grayer Statistic AP VS Percent 04/08/2014 0.6   Final  . Loletha Grayer Statistic AS VS Percent 04/08/2014 53.7   Final  . Eval Rhythm 04/08/2014 AF/VS   Final  . Miscellaneous Comment 04/08/2014    Final                   Value:Pacemaker check in clinic. Normal device function. Threshold, sensing, impedances consistent with previous measurements. Device programmed to maximize longevity. Persistant AF + Eliquis/ASA. No high ventricular rates noted. Device programmed at  appropriate safety margins. Histogram distribution appropriate for patient activity level. Device programmed to optimize intrinsic conduction. Mode reprogrammed from DDDR to VVIR. Batt voltage 2.97V (ERI 2.81V). Patient will follow up via [facility] on 5-3  and with [facility] in 12 months.      Assessment/Plan  1. Chronic systolic congestive heart failure Compensated  2. Chronic fatigue Multifactorial: Congestive heart failure, dyspnea, post stroke, poor balance and increased work of walking  3. Midline low back pain with sciatica, sciatica laterality unspecified Chronic and related to osteoarthrosis and spondylosis of low  back.  4. Pain in joint, lower leg, left Persistent pain in the knee. He understands he is not a surgical candidate. Continues to use Tylenol and occasionally tramadol. Encouraged the patient to use the tramadol more frequently since it does seem to help relieve this discomfort. Encouraged use of walker or cane due to gait instability related to his discomfort in the knee and back.  5. Dyspnea Congestive heart failure is under good control at the present time. Dyspnea is related to easy fatigue.  6. Essential hypertension Mild elevation in systolic blood pressure in the office. Review of pressures outside the office indicates normal systolic blood pressure on most occasions. No change in medications. Continue to observe.  7. Type 2 diabetes mellitus with other circulatory complications Continue to avoid sweets in the diet.  8. Type 2 diabetes mellitus with diabetic chronic kidney disease Continue to avoid sweets in the diet.  9. CKD stage 2 due to type 2 diabetes mellitus Mild disturbance in renal function  10. 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 Unsure if this will improve any in the future. He has had speech therapy which was of marginal benefit.

## 2014-07-07 ENCOUNTER — Encounter: Payer: Self-pay | Admitting: Internal Medicine

## 2014-07-08 ENCOUNTER — Telehealth: Payer: Self-pay | Admitting: Cardiology

## 2014-07-08 ENCOUNTER — Encounter: Payer: Medicare Other | Admitting: *Deleted

## 2014-07-08 DIAGNOSIS — M2550 Pain in unspecified joint: Secondary | ICD-10-CM | POA: Diagnosis not present

## 2014-07-08 DIAGNOSIS — I509 Heart failure, unspecified: Secondary | ICD-10-CM | POA: Diagnosis not present

## 2014-07-08 DIAGNOSIS — I10 Essential (primary) hypertension: Secondary | ICD-10-CM | POA: Diagnosis not present

## 2014-07-08 DIAGNOSIS — I4891 Unspecified atrial fibrillation: Secondary | ICD-10-CM | POA: Diagnosis not present

## 2014-07-08 DIAGNOSIS — I259 Chronic ischemic heart disease, unspecified: Secondary | ICD-10-CM | POA: Diagnosis not present

## 2014-07-08 DIAGNOSIS — M6281 Muscle weakness (generalized): Secondary | ICD-10-CM | POA: Diagnosis not present

## 2014-07-08 DIAGNOSIS — E039 Hypothyroidism, unspecified: Secondary | ICD-10-CM | POA: Diagnosis not present

## 2014-07-08 DIAGNOSIS — M25562 Pain in left knee: Secondary | ICD-10-CM | POA: Diagnosis not present

## 2014-07-08 DIAGNOSIS — D472 Monoclonal gammopathy: Secondary | ICD-10-CM | POA: Diagnosis not present

## 2014-07-08 NOTE — Telephone Encounter (Signed)
Spoke with pt and reminded pt of remote transmission that is due today. Pt verbalized understanding.   

## 2014-07-09 ENCOUNTER — Encounter: Payer: Self-pay | Admitting: Cardiology

## 2014-07-10 ENCOUNTER — Non-Acute Institutional Stay: Payer: Medicare Other | Admitting: Nurse Practitioner

## 2014-07-10 ENCOUNTER — Encounter: Payer: Self-pay | Admitting: Nurse Practitioner

## 2014-07-10 VITALS — BP 138/76 | HR 64 | Temp 97.4°F | Wt 162.0 lb

## 2014-07-10 DIAGNOSIS — I482 Chronic atrial fibrillation, unspecified: Secondary | ICD-10-CM

## 2014-07-10 DIAGNOSIS — F329 Major depressive disorder, single episode, unspecified: Secondary | ICD-10-CM | POA: Diagnosis not present

## 2014-07-10 DIAGNOSIS — K5900 Constipation, unspecified: Secondary | ICD-10-CM

## 2014-07-10 DIAGNOSIS — I5022 Chronic systolic (congestive) heart failure: Secondary | ICD-10-CM | POA: Diagnosis not present

## 2014-07-10 DIAGNOSIS — I10 Essential (primary) hypertension: Secondary | ICD-10-CM

## 2014-07-10 DIAGNOSIS — I63512 Cerebral infarction due to unspecified occlusion or stenosis of left middle cerebral artery: Secondary | ICD-10-CM

## 2014-07-10 DIAGNOSIS — M150 Primary generalized (osteo)arthritis: Secondary | ICD-10-CM

## 2014-07-10 DIAGNOSIS — M1000 Idiopathic gout, unspecified site: Secondary | ICD-10-CM | POA: Diagnosis not present

## 2014-07-10 DIAGNOSIS — R609 Edema, unspecified: Secondary | ICD-10-CM | POA: Diagnosis not present

## 2014-07-10 DIAGNOSIS — E039 Hypothyroidism, unspecified: Secondary | ICD-10-CM

## 2014-07-10 DIAGNOSIS — M159 Polyosteoarthritis, unspecified: Secondary | ICD-10-CM

## 2014-07-10 DIAGNOSIS — N401 Enlarged prostate with lower urinary tract symptoms: Secondary | ICD-10-CM

## 2014-07-10 DIAGNOSIS — I6350 Cerebral infarction due to unspecified occlusion or stenosis of unspecified cerebral artery: Secondary | ICD-10-CM

## 2014-07-10 DIAGNOSIS — IMO0002 Reserved for concepts with insufficient information to code with codable children: Secondary | ICD-10-CM

## 2014-07-11 DIAGNOSIS — E039 Hypothyroidism, unspecified: Secondary | ICD-10-CM | POA: Diagnosis not present

## 2014-07-11 DIAGNOSIS — M2550 Pain in unspecified joint: Secondary | ICD-10-CM | POA: Diagnosis not present

## 2014-07-11 DIAGNOSIS — M6281 Muscle weakness (generalized): Secondary | ICD-10-CM | POA: Diagnosis not present

## 2014-07-11 DIAGNOSIS — D472 Monoclonal gammopathy: Secondary | ICD-10-CM | POA: Diagnosis not present

## 2014-07-11 DIAGNOSIS — M25562 Pain in left knee: Secondary | ICD-10-CM | POA: Diagnosis not present

## 2014-07-11 DIAGNOSIS — I10 Essential (primary) hypertension: Secondary | ICD-10-CM | POA: Diagnosis not present

## 2014-07-15 DIAGNOSIS — I509 Heart failure, unspecified: Secondary | ICD-10-CM | POA: Diagnosis not present

## 2014-07-15 LAB — BASIC METABOLIC PANEL
BUN: 24 mg/dL — AB (ref 4–21)
Creatinine: 0.9 mg/dL (ref 0.6–1.3)
Glucose: 102 mg/dL
Potassium: 4 mmol/L (ref 3.4–5.3)
SODIUM: 136 mmol/L — AB (ref 137–147)

## 2014-07-15 NOTE — Assessment & Plan Note (Signed)
No flare ups. Takes Allopurinol 50mg  daily.

## 2014-07-15 NOTE — Assessment & Plan Note (Signed)
BLE especially RLE from previous trace edema to now 1-2+. Decompensated CHF contributed to the problem. Will continue gentle diurese him with Furosemide 20mg  bid. Monitor BMP and BNP. Venous doppler RLE to r/o DVT.

## 2014-07-15 NOTE — Assessment & Plan Note (Signed)
Back pain is subsided, but overall he has joints aches and pain at times. Prn Tylenol and Tramadol available to him.

## 2014-07-15 NOTE — Assessment & Plan Note (Signed)
Worsened urinary incontinence-denied dysuria or lower abd/CVA discomfort. Will f/u Urology.

## 2014-07-15 NOTE — Assessment & Plan Note (Signed)
takes Levothyroxine 34mcg, TSH 4.551 06/27/14-scheduled TSH in 3 months.

## 2014-07-15 NOTE — Assessment & Plan Note (Signed)
Rate controlled, takes Metoprolol 75mg  bid. Eliquis for VTE risk reduction.

## 2014-07-15 NOTE — Assessment & Plan Note (Signed)
Takes Lorazepam 0.5mg  hs as needed to sleep and rest.

## 2014-07-15 NOTE — Progress Notes (Signed)
Patient ID: Jon [name], male   DOB: March 29, 1921, 81 y.o.   MRN: [id number]   Code Status: DNR  Allergies  Allergen Reactions  . Caduet [Amlodipine-Atorvastatin] Other (See Comments)    Weakness   . Crestor [Rosuvastatin] Other (See Comments)    Muscle pain/weakness  . Cymbalta [Duloxetine Hcl] Other (See Comments)    Excessive sedation  . Lipitor [Atorvastatin Calcium] Other (See Comments)    Muscle pain/weakness  . Lisinopril Other (See Comments)    Doesn't remember reaction  . Quinolones     Risk of rupture of AAA  . Simvastatin Other (See Comments)    Muscle pain/weakness    Chief Complaint  Patient presents with  . Shortness of Breath    worst then last week, nno energy, just wants to set around, les still swollen.      HPI: Patient is a 81 y.o. male seen in the clinic at [facility] today for evaluation of CHF and other chronic medical conditions.  Problem List Items Addressed This Visit    Atrial fibrillation - Primary (Chronic)    Rate controlled, takes Metoprolol 38m bid. Eliquis for VTE risk reduction.         Relevant Medications   furosemide (LASIX) 20 MG tablet   HTN (hypertension) (Chronic)    Metoprolol 75 mg twice a day and Losartan 1081mdaily controlled       Relevant Medications   furosemide (LASIX) 20 MG tablet   Hypothyroidism (Chronic)    takes Levothyroxine 7569m TSH 4.551 06/27/14-scheduled TSH in 3 months.        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       Chronic systolic congestive heart failure (Chronic)    03/24/14 CXR minimal bibasilar atelectasis.  Clinical compensated-the patient requests to dc am Furosemide-f/u BMP/BNP in 2 weks. Observe the patient.  06/27/14 BNP 226.1-observe weight, edema, respiratory symptoms, repeat BNP 3 months.  Decompensation of CHF-resumed Furosemide 20mg68m-still c/o DOE, gained weight about 1-2Ibs, more edematous BLE-will continue  gentle diurese him, f/u BMP and BNP in one week.             Relevant Medications   furosemide (LASIX) 20 MG tablet   Gout    No flare ups. Takes Allopurinol 50mg 53my.         Enlarged prostate with lower urinary tract symptoms (LUTS)    Worsened urinary incontinence-denied dysuria or lower abd/CVA discomfort. Will f/u Urology.       Osteoarthritis    Back pain is subsided, but overall he has joints aches and pain at times. Prn Tylenol and Tramadol available to him.        Edema    BLE especially RLE from previous trace edema to now 1-2+. Decompensated CHF contributed to the problem. Will continue gentle diurese him with Furosemide 20mg b84mMonitor BMP and BNP. Venous doppler RLE to r/o DVT.       Constipation    Stable since 04/15/14 MiraLax prn          Review of Systems:  Review of Systems  Constitutional: Negative for fever, chills and diaphoresis.  HENT: Positive for hearing loss. Negative for congestion, ear discharge, ear pain, nosebleeds, sore throat and tinnitus.   Eyes: Negative for photophobia, pain, discharge and redness.  Respiratory: Negative for cough, shortness of breath, wheezing and stridor.   Cardiovascular: Positive for leg swelling. Negative for chest pain  and palpitations.       Worsened edema BLE 1-2+ especially RLE w/o tenderness or overly erythema.   Gastrointestinal: Negative for nausea, vomiting, abdominal pain, diarrhea, constipation and blood in stool.  Endocrine: Negative for polydipsia.  Genitourinary: Positive for frequency. Negative for dysuria, urgency, hematuria and flank pain.  Musculoskeletal: Positive for back pain. Negative for myalgias and neck pain.  Skin: Negative for rash.  Allergic/Immunologic: Negative for environmental allergies.  Neurological: Negative for dizziness, tremors, seizures, weakness and headaches.  Hematological: Bruises/bleeds easily.  Psychiatric/Behavioral: Negative for suicidal ideas and  hallucinations. The patient is nervous/anxious.     Past Medical History  Diagnosis Date  . Hypertension   . Coronary artery disease   . Arthritis   . Cataract   . Anxiety   . Blood transfusion without reported diagnosis   . Hyperlipidemia   . Heart murmur   . Allergy   . Thyroid disease   . PAF (paroxysmal atrial fibrillation)     coumadin  . History of abdominal aortic aneurysm   . History of echocardiogram 12/2008    EF >55%; mild concentric LVH; mild MR; mild-mod TR; mild AV regurg;   . History of nuclear stress test 12/2010    lexiscan; low risk; compared to prior study, perfusion improved  . History of Doppler ultrasound 05/22/2012    LEAs; R anterior tibial artery appeared occluded; L posterior tibial shows short segment of occlusive ds  . History of Doppler ultrasound 05/22/2012    Abdominal Aortic Doppler; slight increase in fusiform aneurysm   . Stroke   . Adjustment disorder with mixed anxiety and depressed mood 04/18/2013    Post CVA depression and anxiety   . Abnormality of gait 04/18/2013  . 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 Increased exposure of the right cornea.   . Pruritic condition 01/24/2013  . Memory change 01/24/2013  . Abnormal PFTs 10/30/2012    Followed in Pulmonary clinic/ [facility]/ [place]  - PFT's 10/22/12 VC  55% no obst, DLCO 50%  - PFT's 12/20/2012 VC 83% and no obst, dLCO 55%  -11/08/2012  Walked RA x 3 laps @ 185 ft each stopped due to  End of study, not desat -11/08/12 esr 10    . Impotence of organic origin 10/03/2000  . Osteoarthrosis, unspecified whether generalized or localized, unspecified site 02/05/2003  . Hypertrophy of prostate with urinary obstruction and other lower urinary tract symptoms (LUTS) 04/28/2004  . Gout, unspecified 11/03/2004  . Acute on chronic systolic congestive heart failure, NYHA class 2 03/16/2006    BNP 376.9 05/28/13   . Atherosclerosis of renal artery 10/18/2006  . Unspecified vitamin D  deficiency 10/18/2006  . Obstructive sleep apnea (adult) (pediatric) 07/23/2008  . Major depressive disorder, single episode, unspecified 03/05/2009  . Basal cell carcinoma of skin of other and unspecified parts of face 12/24/2009  . Internal hemorrhoids without mention of complication 50/11/3816  . Muscle weakness (generalized) 03/10/2011  . Pain in joint, lower leg 08/04/2011  . Nocturia 10/27/2011  . Aortic aneurysm of unspecified site without mention of rupture 10/27/2011  . Fall 03/04/2011  . Hyponatremia 03/04/2011  . TIA (transient ischemic attack) 04/30/2013  . Fatigue 04/18/2013  . 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 CVA - Rt brain stroke 12/14 02/18/2013    02/19/13 angiography CT head:  Diffuse atherosclerotic irregularity and plaque formation of the distal right common carotid  artery and proximal internal carotid artery without significant stenosis. Plaque ulceration is present and is a source of emboli. A small right thalamic CVA    . Dyslipidemia 11/03/2004  . Type II or unspecified type diabetes mellitus without mention of complication, not stated as uncontrolled 11/24/2004  . Spinal stenosis in cervical region 12/15/2004  . Right bundle branch block 04/19/2006  . Cardiac pacemaker in situ- MDT 10/11 03/18/2010    Medtronic revo implant in October 2011. Severe sinus bradycardia in the 30s, but not truly pacemaker dependent   . Long term (current) use of anticoagulants 03/17/2011  . Hypothyroidism 03/04/2011  . HTN (hypertension) 03/04/2011  . Atrial fibrillation 03/04/2011     He was on coumadin until his stroke last December and was switched to apixaban 2.76m daily which he has been taking faithfully without reported side effects.    . SSS (sick sinus syndrome) 04/08/2014  . DM (diabetes mellitus), type 2 with renal complications 48/75/6433 . CKD stage 2 due to type 2 diabetes mellitus 07/03/2014   Past Surgical History  Procedure  Laterality Date  . Coronary artery bypass graft  1989  . Pacemaker insertion  2011  . Insert / replace / remove pacemaker    . Eye surgery    . Joint replacement    . Hernia repair    . Coronary angioplasty with stent placement  2008    stent to SVG to OM   Social History:   reports that he quit smoking about 47 years ago. His smoking use included Cigarettes. He has a 11.25 pack-year smoking history. He has never used smokeless tobacco. He reports that he does not drink alcohol or use illicit drugs.  Family History  Problem Relation Age of Onset  . Diabetes Father   . Other Mother     PAD with amputations    Medications: Patient's Medications  New Prescriptions   No medications on file  Previous Medications   ACETAMINOPHEN (TYLENOL) 325 MG TABLET    Take 2 tablets (650 mg total) by mouth 2 (two) times daily. DO NOT EXCEED 4 TABLETS DAILY   ALLOPURINOL (ZYLOPRIM) 100 MG TABLET    Take 50 mg by mouth daily.    AMOXICILLIN (AMOXIL) 500 MG CAPSULE    Take 500 mg by mouth. Take 4 caps prior to dential   APIXABAN (ELIQUIS) 2.5 MG TABS TABLET    Take 1 tablet (2.5 mg total) by mouth 2 (two) times daily.   CHOLECALCIFEROL (VITAMIN D) 400 UNITS CAPSULE    Take 400 Units by mouth 2 (two) times daily.    FUROSEMIDE (LASIX) 20 MG TABLET    Take 20 mg by mouth. Take one at 8 am, 2 pm for edema   IPRATROPIUM (ATROVENT) 0.06 % NASAL SPRAY    One puff in each nostril before each meal   LEVOTHYROXINE (SYNTHROID, LEVOTHROID) 100 MCG TABLET    Take 100 mcg by mouth daily before breakfast.   LIDOCAINE (XYLOCAINE) 2 % SOLUTION    Use as directed 20 mLs in the mouth or throat 3 (three) times daily before meals.   LORAZEPAM (ATIVAN) 0.5 MG TABLET    Take one tablet by mouth at bedtime   LOSARTAN (COZAAR) 100 MG TABLET    Take 1 tablet (100 mg total) by mouth daily.   METOPROLOL TARTRATE (LOPRESSOR) 25 MG TABLET    Take 3 tablets (75 mg total) by mouth 2 (two) times daily.   NITROGLYCERIN (NITROSTAT)  0.4 MG SL  TABLET    Place 0.4 mg under the tongue every 5 (five) minutes as needed for chest pain.    OLOPATADINE (PATANOL) 0.1 % OPHTHALMIC SOLUTION    One drop in each eye twice daily to help conjunctival irritation   POLYETHYLENE GLYCOL (MIRALAX / GLYCOLAX) PACKET    Take 17 g by mouth daily.   POLYVINYL ALCOHOL (LIQUIFILM TEARS) 1.4 % OPHTHALMIC SOLUTION    Place 1 drop into both eyes. One drop in both eyes twice daily as needed for dry eyes   TRAMADOL (ULTRAM) 50 MG TABLET    Take 50 mg by mouth every 6 (six) hours as needed. for pain   TRIAMCINOLONE ACETONIDE (KENALOG IN ORABASE MT)    Use as directed 5 mg in the mouth or throat.  Modified Medications   No medications on file  Discontinued Medications   No medications on file     Physical Exam: Physical Exam  Constitutional: He is oriented to person, place, and time. He appears well-developed and well-nourished. No distress.  HENT:  Severe hearing loss. Bilateral aides.Right facial droop.    Eyes:  Corrective lenses. Increased exposure of the right cornea due to post CVA facial droop.  Neck: No JVD present. No tracheal deviation present. No thyromegaly present.  Cardiovascular: Normal rate.  Frequent extrasystoles are present.  Murmur heard. Pacemaker left upper chest. Systolic murmur 8-4/6  Pulmonary/Chest: Effort normal. He has no wheezes. He has rales.  dry rales posterior lung bases.   Abdominal: Soft. Bowel sounds are normal. He exhibits no distension and no mass. There is no tenderness.  Musculoskeletal: Normal range of motion. He exhibits edema. He exhibits no tenderness.  Unstable gait. Using 4 wheel walker and w/c. Weaker on the right side. Hx of the right total knee replacement. Edema has worsened 1-2+ especially in the RLE even if after Furosemide resumed after initial discontinuation.   Lymphadenopathy:    He has no cervical adenopathy.  Neurological: He is alert and oriented to person, place, and time. He displays  normal reflexes. No cranial nerve deficit. He exhibits normal muscle tone. Coordination normal.  Slurred speech. Right side weakness.   Skin: Skin is warm and dry. No rash noted. No erythema. No pallor.  Psychiatric: He has a normal mood and affect. His behavior is normal. Judgment and thought content normal.  Nursing note and vitals reviewed.   Filed Vitals:   07/10/14 1340  BP: 138/76  Pulse: 64  Temp: 97.4 F (36.3 C)  TempSrc: Oral  Weight: 162 lb (73.483 kg)  SpO2: 97%      Labs reviewed: Basic Metabolic Panel:  Recent Labs  08/29/13  12/31/13 04/18/14 05/30/14 06/27/14  NA 137  < > 136* 134* 134* 136*  K 4.6  < > 4.4 4.6 4.1 4.5  BUN 23*  < > 19 24* 23* 26*  CREATININE 1.1  < > 1.0 1.0 0.9 0.9  TSH 0.62  --  2.10  --   --  4.55  < > = values in this interval not displayed. Liver Function Tests:  Recent Labs  05/30/14  AST 14  ALT 10  ALKPHOS 93   No results for input(s): LIPASE, AMYLASE in the last 8760 hours. No results for input(s): AMMONIA in the last 8760 hours. CBC:  Recent Labs  10/29/13 12/23/13 05/30/14  WBC 6.8 12.2 7.2  HGB 15.6 15.0 13.9  HCT 45 43 41  PLT 252 174 234   Lipid Panel: No results for input(s): CHOL,  HDL, LDLCALC, TRIG, CHOLHDL, LDLDIRECT in the last 8760 hours.   Past Procedures:  04/30/2013 CLINICAL DATA: Stroke. Ex-smoker. EXAM: CHEST 2 VIEW . IMPRESSION: Minimal bibasilar linear atelectasis.   04/30/2013 CLINICAL DATA: TRANSIENT ISCHEMIC ATTACK EXAM: CT HEAD WITHOUT CONTRAST IMPRESSION: Findings consistent with an area of subacute to chronic lacunar infarction left MCA distribution. Involutional and chronic changes also appreciated. There is no evidence of acute abnormalities.   05/01/2013 EXAM: MRI HEAD WITHOUT CONTRAST MRA HEAD WITHOUT CONTRAST IMPRESSION: 1. Small acute/subacute infarct left corona radiata. No mass effect or hemorrhage. 2. Three superimposed punctate acute lacunar infarcts (right  caudate nucleus, left occipital pole, left left parietal lobe). These could reflect synchronous small vessel ischemia rather than a recent embolic event. 3. Underlying chronic small vessel ischemia. 4. Intracranial atherosclerosis. Fusiform aneurysmal enlargement of the left ICA (cavernous segment) and right ICA terminus. No anterior circulation major branch occlusion. 5. Atherosclerosis in stenosis of the distal right vertebral artery which appears hemodynamically significant. Other posterior circulation atherosclerosis without stenosis.   Mr Jodene Nam Head/brain Wo Cm  2/25/2015EXAM: MRI HEAD WITHOUT CONTRAST MRA HEAD WITHOUT CONTRAST IMPRESSION: 1. Small acute/subacute infarct left corona radiata. No mass effect or hemorrhage. 2. Three superimposed punctate acute lacunar infarcts (right caudate nucleus, left occipital pole, left left parietal lobe). These could reflect synchronous small vessel ischemia rather than a recent embolic event. 3. Underlying chronic small vessel ischemia. 4. Intracranial atherosclerosis. Fusiform aneurysmal enlargement of the left ICA (cavernous segment) and right ICA terminus. No anterior circulation major branch occlusion. 5. Atherosclerosis in stenosis of the distal right vertebral artery which appears hemodynamically significant. Other posterior circulation atherosclerosis without stenosis.   EKG Atrial fibrillation Right bundle branch block ST depr, consider ischemia, anterolateral lds Baseline wander in lead(s) II III aVR aVL aVF V3   Assessment/Plan Atrial fibrillation Rate controlled, takes Metoprolol 25m bid. Eliquis for VTE risk reduction.      Chronic systolic congestive heart failure 03/24/14 CXR minimal bibasilar atelectasis.  Clinical compensated-the patient requests to dc am Furosemide-f/u BMP/BNP in 2 weks. Observe the patient.  06/27/14 BNP 226.1-observe weight, edema, respiratory symptoms, repeat BNP 3 months.  Decompensation of CHF-resumed  Furosemide 297mbid-still c/o DOE, gained weight about 1-2Ibs, more edematous BLE-will continue gentle diurese him, f/u BMP and BNP in one week.          Edema BLE especially RLE from previous trace edema to now 1-2+. Decompensated CHF contributed to the problem. Will continue gentle diurese him with Furosemide 2080mid. Monitor BMP and BNP. Venous doppler RLE to r/o DVT.    Hypothyroidism takes Levothyroxine 70m48mTSH 4.551 06/27/14-scheduled TSH in 3 months.     Enlarged prostate with lower urinary tract symptoms (LUTS) Worsened urinary incontinence-denied dysuria or lower abd/CVA discomfort. Will f/u Urology.    Gout No flare ups. Takes Allopurinol 50mg95mly.      Depression due to stroke Takes Lorazepam 0.5mg h51ms needed to sleep and rest.    HTN (hypertension) Metoprolol 75 mg twice a day and Losartan 100mg d48m controlled    Constipation Stable since 04/15/14 MiraLax prn    Osteoarthritis Back pain is subsided, but overall he has joints aches and pain at times. Prn Tylenol and Tramadol available to him.       Family/ Staff Communication: observe the patient.   Goals of Care: AL  Labs/tests ordered: BMP/BNP in one week. Venous Doppler RLE to r/o DVT

## 2014-07-15 NOTE — Assessment & Plan Note (Signed)
03/24/14 CXR minimal bibasilar atelectasis.  Clinical compensated-the patient requests to dc am Furosemide-f/u BMP/BNP in 2 weks. Observe the patient.  06/27/14 BNP 226.1-observe weight, edema, respiratory symptoms, repeat BNP 3 months.  Decompensation of CHF-resumed Furosemide 20mg  bid-still c/o DOE, gained weight about 1-2Ibs, more edematous BLE-will continue gentle diurese him, f/u BMP and BNP in one week.

## 2014-07-15 NOTE — Assessment & Plan Note (Signed)
Metoprolol 75 mg twice a day and Losartan 100mg  daily controlled

## 2014-07-15 NOTE — Assessment & Plan Note (Signed)
Stable since 04/15/14 MiraLax prn

## 2014-07-17 ENCOUNTER — Non-Acute Institutional Stay: Payer: Medicare Other | Admitting: Nurse Practitioner

## 2014-07-17 ENCOUNTER — Encounter: Payer: Self-pay | Admitting: Nurse Practitioner

## 2014-07-17 DIAGNOSIS — N401 Enlarged prostate with lower urinary tract symptoms: Secondary | ICD-10-CM

## 2014-07-17 DIAGNOSIS — M5440 Lumbago with sciatica, unspecified side: Secondary | ICD-10-CM

## 2014-07-17 DIAGNOSIS — I5022 Chronic systolic (congestive) heart failure: Secondary | ICD-10-CM

## 2014-07-17 DIAGNOSIS — L03115 Cellulitis of right lower limb: Secondary | ICD-10-CM | POA: Diagnosis not present

## 2014-07-17 DIAGNOSIS — E1159 Type 2 diabetes mellitus with other circulatory complications: Secondary | ICD-10-CM | POA: Diagnosis not present

## 2014-07-17 DIAGNOSIS — M1000 Idiopathic gout, unspecified site: Secondary | ICD-10-CM

## 2014-07-17 DIAGNOSIS — R609 Edema, unspecified: Secondary | ICD-10-CM | POA: Diagnosis not present

## 2014-07-17 DIAGNOSIS — M159 Polyosteoarthritis, unspecified: Secondary | ICD-10-CM

## 2014-07-17 DIAGNOSIS — M150 Primary generalized (osteo)arthritis: Secondary | ICD-10-CM

## 2014-07-17 DIAGNOSIS — F329 Major depressive disorder, single episode, unspecified: Secondary | ICD-10-CM

## 2014-07-17 DIAGNOSIS — I480 Paroxysmal atrial fibrillation: Secondary | ICD-10-CM

## 2014-07-17 DIAGNOSIS — I6350 Cerebral infarction due to unspecified occlusion or stenosis of unspecified cerebral artery: Secondary | ICD-10-CM

## 2014-07-17 DIAGNOSIS — I10 Essential (primary) hypertension: Secondary | ICD-10-CM | POA: Diagnosis not present

## 2014-07-17 DIAGNOSIS — E039 Hypothyroidism, unspecified: Secondary | ICD-10-CM

## 2014-07-17 DIAGNOSIS — K5900 Constipation, unspecified: Secondary | ICD-10-CM

## 2014-07-17 DIAGNOSIS — IMO0002 Reserved for concepts with insufficient information to code with codable children: Secondary | ICD-10-CM

## 2014-07-17 NOTE — Assessment & Plan Note (Signed)
Worsened urinary incontinence-denied dysuria or lower abd/CVA discomfort. Will f/u Urology.

## 2014-07-17 NOTE — Assessment & Plan Note (Signed)
Takes Lorazepam 0.5mg  hs as needed to sleep and rest.

## 2014-07-17 NOTE — Progress Notes (Signed)
Patient ID: Jon [name], male   DOB: Sep 20, 1921, 81 y.o.   MRN: [id number]   Code Status: DNR  Allergies  Allergen Reactions  . Caduet [Amlodipine-Atorvastatin] Other (See Comments)    Weakness   . Crestor [Rosuvastatin] Other (See Comments)    Muscle pain/weakness  . Cymbalta [Duloxetine Hcl] Other (See Comments)    Excessive sedation  . Lipitor [Atorvastatin Calcium] Other (See Comments)    Muscle pain/weakness  . Lisinopril Other (See Comments)    Doesn't remember reaction  . Quinolones     Risk of rupture of AAA  . Simvastatin Other (See Comments)    Muscle pain/weakness    Chief Complaint  Patient presents with  . Medical Management of Chronic Issues  . Acute Visit    warm, swelling, tenderness, and open area at achilles tender of the left     HPI: Patient is a 81 y.o. male seen in the AL at [facility] today for evaluation of right lower leg especially ankle area heat, erythema, swelling, open area at achilles tender area,  and other chronic medical conditions.  Problem List Items Addressed This Visit    Atrial fibrillation (Chronic)    Rate controlled, takes Metoprolol 75mg  bid. Eliquis for VTE risk reduction.          HTN (hypertension) (Chronic)    Metoprolol 75 mg twice a day and Losartan 100mg  daily controlled        Hypothyroidism (Chronic)    takes Levothyroxine 69mcg, TSH 4.551 06/27/14-scheduled TSH in 3 months.         Type 2 diabetes mellitus with circulatory disorder (Chronic)    07/15/14 blood sugar 102       Depression due to stroke (Chronic)    Takes Lorazepam 0.5mg  hs as needed to sleep and rest.        Chronic systolic congestive heart failure (Chronic)    07/15/14 BNP 313.9. Less BLE edema since Furosemide resumed.       Lower back pain (Chronic)    05/07/14 X-ray Lumbar psine, sacrum, and coccyx: mild degree of osteoporosis, moderate spondylosis, mild osteoarthritis. OTC Tylenol prn        Gout    No flare ups.  Takes Allopurinol 50mg  daily.          Enlarged prostate with lower urinary tract symptoms (LUTS)    Worsened urinary incontinence-denied dysuria or lower abd/CVA discomfort. Will f/u Urology.        Osteoarthritis    Back pain is subsided, but overall he has joints aches and pain at times. Prn Tylenol and Tramadol available to him.         Edema    Subsided since Furosemide resumed. Now RLE edematous, erythema, mild heat, open area at achilles       Constipation    Stable since 04/15/14 MiraLax prn       Cellulitis of leg, right - Primary    07/10/14 venous Doppler RLE negative for DVT at major veins of the right lower extremity.  RLE-especially from 1/3 lower leg to ankle to foot-swelling, erythematous, heat, open area at achilles tender area, and somewhat increased tenderness. Will apply Bactroban daily to the open area then cover with foam dressing until healed. Doxycycline 100mg  bid x 10 days. Observe.            Review of Systems:  Review of Systems  Constitutional: Negative for fever, chills and diaphoresis.  HENT: Positive for hearing loss. Negative for congestion,  ear discharge, ear pain, nosebleeds, sore throat and tinnitus.   Eyes: Negative for photophobia, pain, discharge and redness.  Respiratory: Negative for cough, shortness of breath, wheezing and stridor.   Cardiovascular: Positive for leg swelling. Negative for chest pain and palpitations.       Worsened edema BLE 1-2+ especially RLE w/o tenderness or overly erythema.   Gastrointestinal: Negative for nausea, vomiting, abdominal pain, diarrhea, constipation and blood in stool.  Endocrine: Negative for polydipsia.  Genitourinary: Positive for frequency. Negative for dysuria, urgency, hematuria and flank pain.  Musculoskeletal: Positive for back pain. Negative for myalgias and neck pain.  Skin: Negative for rash.       The right lower 1/3 of lower leg, ankle, foot edematous, mild heat, erythema, open area  at achilles, and tenderness.   Allergic/Immunologic: Negative for environmental allergies.  Neurological: Negative for dizziness, tremors, seizures, weakness and headaches.  Hematological: Bruises/bleeds easily.  Psychiatric/Behavioral: Negative for suicidal ideas and hallucinations. The patient is nervous/anxious.     Past Medical History  Diagnosis Date  . Hypertension   . Coronary artery disease   . Arthritis   . Cataract   . Anxiety   . Blood transfusion without reported diagnosis   . Hyperlipidemia   . Heart murmur   . Allergy   . Thyroid disease   . PAF (paroxysmal atrial fibrillation)     coumadin  . History of abdominal aortic aneurysm   . History of echocardiogram 12/2008    EF >55%; mild concentric LVH; mild MR; mild-mod TR; mild AV regurg;   . History of nuclear stress test 12/2010    lexiscan; low risk; compared to prior study, perfusion improved  . History of Doppler ultrasound 05/22/2012    LEAs; R anterior tibial artery appeared occluded; L posterior tibial shows short segment of occlusive ds  . History of Doppler ultrasound 05/22/2012    Abdominal Aortic Doppler; slight increase in fusiform aneurysm   . Stroke   . Adjustment disorder with mixed anxiety and depressed mood 04/18/2013    Post CVA depression and anxiety   . Abnormality of gait 04/18/2013  . 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 Increased exposure of the right cornea.   . Pruritic condition 01/24/2013  . Memory change 01/24/2013  . Abnormal PFTs 10/30/2012    Followed in Pulmonary clinic/ [facility]/ [facility]  - PFT's 10/22/12 VC  55% no obst, DLCO 50%  - PFT's 12/20/2012 VC 83% and no obst, dLCO 55%  -11/08/2012  Walked RA x 3 laps @ 185 ft each stopped due to  End of study, not desat -11/08/12 esr 10    . Impotence of organic origin 10/03/2000  . Osteoarthrosis, unspecified whether generalized or localized, unspecified site 02/05/2003  . Hypertrophy of prostate with urinary  obstruction and other lower urinary tract symptoms (LUTS) 04/28/2004  . Gout, unspecified 11/03/2004  . Acute on chronic systolic congestive heart failure, NYHA class 2 03/16/2006    BNP 376.9 05/28/13   . Atherosclerosis of renal artery 10/18/2006  . Unspecified vitamin D deficiency 10/18/2006  . Obstructive sleep apnea (adult) (pediatric) 07/23/2008  . Major depressive disorder, single episode, unspecified 03/05/2009  . Basal cell carcinoma of skin of other and unspecified parts of face 12/24/2009  . Internal hemorrhoids without mention of complication 62/94/7654  . Muscle weakness (generalized) 03/10/2011  . Pain in joint, lower leg 08/04/2011  . Nocturia 10/27/2011  . Aortic aneurysm of unspecified site without mention of rupture  10/27/2011  . Fall 03/04/2011  . Hyponatremia 03/04/2011  . TIA (transient ischemic attack) 04/30/2013  . Fatigue 04/18/2013  . 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 CVA - Rt brain stroke 12/14 02/18/2013    02/19/13 angiography CT head:  Diffuse atherosclerotic irregularity and plaque formation of the distal right common carotid artery and proximal internal carotid artery without significant stenosis. Plaque ulceration is present and is a source of emboli. A small right thalamic CVA    . Dyslipidemia 11/03/2004  . Type II or unspecified type diabetes mellitus without mention of complication, not stated as uncontrolled 11/24/2004  . Spinal stenosis in cervical region 12/15/2004  . Right bundle branch block 04/19/2006  . Cardiac pacemaker in situ- MDT 10/11 03/18/2010    Medtronic revo implant in October 2011. Severe sinus bradycardia in the 30s, but not truly pacemaker dependent   . Long term (current) use of anticoagulants 03/17/2011  . Hypothyroidism 03/04/2011  . HTN (hypertension) 03/04/2011  . Atrial fibrillation 03/04/2011     He was on coumadin until his stroke last December and was switched to apixaban 2.$RemoveBefor'5mg'zGibBoRiWXJy$  daily  which he has been taking faithfully without reported side effects.    . SSS (sick sinus syndrome) 04/08/2014  . DM (diabetes mellitus), type 2 with renal complications 8/46/9629  . CKD stage 2 due to type 2 diabetes mellitus 07/03/2014   Past Surgical History  Procedure Laterality Date  . Coronary artery bypass graft  1989  . Pacemaker insertion  2011  . Insert / replace / remove pacemaker    . Eye surgery    . Joint replacement    . Hernia repair    . Coronary angioplasty with stent placement  2008    stent to SVG to OM   Social History:   reports that he quit smoking about 47 years ago. His smoking use included Cigarettes. He has a 11.25 pack-year smoking history. He has never used smokeless tobacco. He reports that he does not drink alcohol or use illicit drugs.  Family History  Problem Relation Age of Onset  . Diabetes Father   . Other Mother     PAD with amputations    Medications: Patient's Medications  New Prescriptions   No medications on file  Previous Medications   ACETAMINOPHEN (TYLENOL) 325 MG TABLET    Take 2 tablets (650 mg total) by mouth 2 (two) times daily. DO NOT EXCEED 4 TABLETS DAILY   ALLOPURINOL (ZYLOPRIM) 100 MG TABLET    Take 50 mg by mouth daily.    AMOXICILLIN (AMOXIL) 500 MG CAPSULE    Take 500 mg by mouth. Take 4 caps prior to dential   APIXABAN (ELIQUIS) 2.5 MG TABS TABLET    Take 1 tablet (2.5 mg total) by mouth 2 (two) times daily.   CHOLECALCIFEROL (VITAMIN D) 400 UNITS CAPSULE    Take 400 Units by mouth 2 (two) times daily.    FUROSEMIDE (LASIX) 20 MG TABLET    Take 20 mg by mouth. Take one at 8 am, 2 pm for edema   IPRATROPIUM (ATROVENT) 0.06 % NASAL SPRAY    One puff in each nostril before each meal   LEVOTHYROXINE (SYNTHROID, LEVOTHROID) 100 MCG TABLET    Take 100 mcg by mouth daily before breakfast.   LIDOCAINE (XYLOCAINE) 2 % SOLUTION    Use as directed 20 mLs in the mouth or throat 3 (three) times daily before meals.  LORAZEPAM (ATIVAN) 0.5  MG TABLET    Take one tablet by mouth at bedtime   LOSARTAN (COZAAR) 100 MG TABLET    Take 1 tablet (100 mg total) by mouth daily.   METOPROLOL TARTRATE (LOPRESSOR) 25 MG TABLET    Take 3 tablets (75 mg total) by mouth 2 (two) times daily.   NITROGLYCERIN (NITROSTAT) 0.4 MG SL TABLET    Place 0.4 mg under the tongue every 5 (five) minutes as needed for chest pain.    OLOPATADINE (PATANOL) 0.1 % OPHTHALMIC SOLUTION    One drop in each eye twice daily to help conjunctival irritation   POLYETHYLENE GLYCOL (MIRALAX / GLYCOLAX) PACKET    Take 17 g by mouth daily.   POLYVINYL ALCOHOL (LIQUIFILM TEARS) 1.4 % OPHTHALMIC SOLUTION    Place 1 drop into both eyes. One drop in both eyes twice daily as needed for dry eyes   TRAMADOL (ULTRAM) 50 MG TABLET    Take 50 mg by mouth every 6 (six) hours as needed. for pain   TRIAMCINOLONE ACETONIDE (KENALOG IN ORABASE MT)    Use as directed 5 mg in the mouth or throat.  Modified Medications   No medications on file  Discontinued Medications   No medications on file     Physical Exam: Physical Exam  Constitutional: He is oriented to person, place, and time. He appears well-developed and well-nourished. No distress.  HENT:  Severe hearing loss. Bilateral aides.Right facial droop.    Eyes:  Corrective lenses. Increased exposure of the right cornea due to post CVA facial droop.  Neck: No JVD present. No tracheal deviation present. No thyromegaly present.  Cardiovascular: Normal rate.  Frequent extrasystoles are present.  Murmur heard. Pacemaker left upper chest. Systolic murmur 8-2/8  Pulmonary/Chest: Effort normal. He has no wheezes. He has rales.  dry rales posterior lung bases.   Abdominal: Soft. Bowel sounds are normal. He exhibits no distension and no mass. There is no tenderness.  Musculoskeletal: Normal range of motion. He exhibits edema. He exhibits no tenderness.  Unstable gait. Using 4 wheel walker and w/c. Weaker on the right side. Hx of the right  total knee replacement. Edema has worsened 1-2+ especially in the RLE even if after Furosemide resumed after initial discontinuation.   Lymphadenopathy:    He has no cervical adenopathy.  Neurological: He is alert and oriented to person, place, and time. He displays normal reflexes. No cranial nerve deficit. He exhibits normal muscle tone. Coordination normal.  Slurred speech. Right side weakness.   Skin: Skin is warm and dry. No rash noted. No erythema. No pallor.  The right lower 1/3 of lower leg, ankle, foot edematous, mild heat, erythema, open area at achilles, and tenderness.    Psychiatric: He has a normal mood and affect. His behavior is normal. Judgment and thought content normal.  Nursing note and vitals reviewed.   Filed Vitals:   07/17/14 1534  BP: 130/70  Pulse: 69  Temp: 97 F (36.1 C)  TempSrc: Tympanic  Resp: 18      Labs reviewed: Basic Metabolic Panel:  Recent Labs  08/29/13  12/31/13  05/30/14 06/27/14 07/15/14  NA 137  < > 136*  < > 134* 136* 136*  K 4.6  < > 4.4  < > 4.1 4.5 4.0  BUN 23*  < > 19  < > 23* 26* 24*  CREATININE 1.1  < > 1.0  < > 0.9 0.9 0.9  TSH 0.62  --  2.10  --   --  4.55  --   < > = values in this interval not displayed. Liver Function Tests:  Recent Labs  05/30/14  AST 14  ALT 10  ALKPHOS 93   No results for input(s): LIPASE, AMYLASE in the last 8760 hours. No results for input(s): AMMONIA in the last 8760 hours. CBC:  Recent Labs  10/29/13 12/23/13 05/30/14  WBC 6.8 12.2 7.2  HGB 15.6 15.0 13.9  HCT 45 43 41  PLT 252 174 234   Lipid Panel: No results for input(s): CHOL, HDL, LDLCALC, TRIG, CHOLHDL, LDLDIRECT in the last 8760 hours.   Past Procedures:  04/30/2013 CLINICAL DATA: Stroke. Ex-smoker. EXAM: CHEST 2 VIEW . IMPRESSION: Minimal bibasilar linear atelectasis.   04/30/2013 CLINICAL DATA: TRANSIENT ISCHEMIC ATTACK EXAM: CT HEAD WITHOUT CONTRAST IMPRESSION: Findings consistent with an area of subacute  to chronic lacunar infarction left MCA distribution. Involutional and chronic changes also appreciated. There is no evidence of acute abnormalities.   05/01/2013 EXAM: MRI HEAD WITHOUT CONTRAST MRA HEAD WITHOUT CONTRAST IMPRESSION: 1. Small acute/subacute infarct left corona radiata. No mass effect or hemorrhage. 2. Three superimposed punctate acute lacunar infarcts (right caudate nucleus, left occipital pole, left left parietal lobe). These could reflect synchronous small vessel ischemia rather than a recent embolic event. 3. Underlying chronic small vessel ischemia. 4. Intracranial atherosclerosis. Fusiform aneurysmal enlargement of the left ICA (cavernous segment) and right ICA terminus. No anterior circulation major branch occlusion. 5. Atherosclerosis in stenosis of the distal right vertebral artery which appears hemodynamically significant. Other posterior circulation atherosclerosis without stenosis.   Mr Jodene Nam Head/brain Wo Cm  2/25/2015EXAM: MRI HEAD WITHOUT CONTRAST MRA HEAD WITHOUT CONTRAST IMPRESSION: 1. Small acute/subacute infarct left corona radiata. No mass effect or hemorrhage. 2. Three superimposed punctate acute lacunar infarcts (right caudate nucleus, left occipital pole, left left parietal lobe). These could reflect synchronous small vessel ischemia rather than a recent embolic event. 3. Underlying chronic small vessel ischemia. 4. Intracranial atherosclerosis. Fusiform aneurysmal enlargement of the left ICA (cavernous segment) and right ICA terminus. No anterior circulation major branch occlusion. 5. Atherosclerosis in stenosis of the distal right vertebral artery which appears hemodynamically significant. Other posterior circulation atherosclerosis without stenosis.   EKG Atrial fibrillation Right bundle branch block ST depr, consider ischemia, anterolateral lds Baseline wander in lead(s) II III aVR aVL aVF V3  07/10/14 venous Doppler RLE negative for DVT at major  veins of the right lower extremity.   Assessment/Plan Cellulitis of leg, right 07/10/14 venous Doppler RLE negative for DVT at major veins of the right lower extremity.  RLE-especially from 1/3 lower leg to ankle to foot-swelling, erythematous, heat, open area at achilles tender area, and somewhat increased tenderness. Will apply Bactroban daily to the open area then cover with foam dressing until healed. Doxycycline 100mg  bid x 10 days. Observe.      Atrial fibrillation Rate controlled, takes Metoprolol 75mg  bid. Eliquis for VTE risk reduction.       HTN (hypertension) Metoprolol 75 mg twice a day and Losartan 100mg  daily controlled     Hypothyroidism takes Levothyroxine 75mcg, TSH 4.551 06/27/14-scheduled TSH in 3 months.      Type 2 diabetes mellitus with circulatory disorder 07/15/14 blood sugar 102    Depression due to stroke Takes Lorazepam 0.5mg  hs as needed to sleep and rest.     Chronic systolic congestive heart failure 07/15/14 BNP 313.9. Less BLE edema since Furosemide resumed.    Lower back pain 05/07/14 X-ray Lumbar psine, sacrum, and coccyx: mild degree  of osteoporosis, moderate spondylosis, mild osteoarthritis. OTC Tylenol prn     Gout No flare ups. Takes Allopurinol $RemoveBeforeDEI'50mg'[id number]$  daily.       Enlarged prostate with lower urinary tract symptoms (LUTS) Worsened urinary incontinence-denied dysuria or lower abd/CVA discomfort. Will f/u Urology.     Osteoarthritis Back pain is subsided, but overall he has joints aches and pain at times. Prn Tylenol and Tramadol available to him.      Edema Subsided since Furosemide resumed. Now RLE edematous, erythema, mild heat, open area at achilles    Constipation Stable since 04/15/14 MiraLax prn      Family/ Staff Communication: observe the patient.   Goals of Care: AL  Labs/tests ordered: none

## 2014-07-17 NOTE — Assessment & Plan Note (Signed)
07/15/14 BNP 313.9. Less BLE edema since Furosemide resumed.

## 2014-07-17 NOTE — Assessment & Plan Note (Signed)
07/10/14 venous Doppler RLE negative for DVT at major veins of the right lower extremity.  RLE-especially from 1/3 lower leg to ankle to foot-swelling, erythematous, heat, open area at achilles tender area, and somewhat increased tenderness. Will apply Bactroban daily to the open area then cover with foam dressing until healed. Doxycycline 100mg  bid x 10 days. Observe.

## 2014-07-17 NOTE — Assessment & Plan Note (Signed)
07/15/14 blood sugar 102

## 2014-07-17 NOTE — Assessment & Plan Note (Signed)
05/07/14 X-ray Lumbar psine, sacrum, and coccyx: mild degree of osteoporosis, moderate spondylosis, mild osteoarthritis. OTC Tylenol prn

## 2014-07-17 NOTE — Assessment & Plan Note (Signed)
Metoprolol 75 mg twice a day and Losartan 100mg  daily controlled

## 2014-07-17 NOTE — Assessment & Plan Note (Signed)
takes Levothyroxine 28mcg, TSH 4.551 06/27/14-scheduled TSH in 3 months.

## 2014-07-17 NOTE — Assessment & Plan Note (Signed)
Back pain is subsided, but overall he has joints aches and pain at times. Prn Tylenol and Tramadol available to him.

## 2014-07-17 NOTE — Assessment & Plan Note (Signed)
Rate controlled, takes Metoprolol 75mg  bid. Eliquis for VTE risk reduction.

## 2014-07-17 NOTE — Assessment & Plan Note (Signed)
Subsided since Furosemide resumed. Now RLE edematous, erythema, mild heat, open area at achilles

## 2014-07-17 NOTE — Assessment & Plan Note (Signed)
No flare ups. Takes Allopurinol 50mg  daily.

## 2014-07-17 NOTE — Assessment & Plan Note (Signed)
Stable since 04/15/14 MiraLax prn

## 2014-07-18 DIAGNOSIS — N200 Calculus of kidney: Secondary | ICD-10-CM | POA: Diagnosis not present

## 2014-07-21 ENCOUNTER — Telehealth: Payer: Self-pay | Admitting: Internal Medicine

## 2014-07-21 DIAGNOSIS — E039 Hypothyroidism, unspecified: Secondary | ICD-10-CM | POA: Diagnosis not present

## 2014-07-21 DIAGNOSIS — M6281 Muscle weakness (generalized): Secondary | ICD-10-CM | POA: Diagnosis not present

## 2014-07-21 DIAGNOSIS — D472 Monoclonal gammopathy: Secondary | ICD-10-CM | POA: Diagnosis not present

## 2014-07-21 DIAGNOSIS — M25562 Pain in left knee: Secondary | ICD-10-CM | POA: Diagnosis not present

## 2014-07-21 DIAGNOSIS — M2550 Pain in unspecified joint: Secondary | ICD-10-CM | POA: Diagnosis not present

## 2014-07-21 DIAGNOSIS — I10 Essential (primary) hypertension: Secondary | ICD-10-CM | POA: Diagnosis not present

## 2014-07-21 NOTE — Telephone Encounter (Signed)
I'm happy to see him back about the swelling and shortness of breath.  Please make an appointment for this week or next. Ok to [place].  Dr. Lemmie Evens

## 2014-07-21 NOTE — Telephone Encounter (Signed)
Patient is having increased edema in R leg and shortness of breath.  He has been checked for a blood clot and that was negative.  He has been started on an antibiotic for cellulitis in his R. Leg.  Please call

## 2014-07-21 NOTE — Telephone Encounter (Signed)
Spoke to daughter & patient, he was thankful to be seen this week. Scheduled for Friday w/ Dr. Debara Pickett at [facility].

## 2014-07-21 NOTE — Telephone Encounter (Signed)
Returned call.  Pt in assisted living. Daughter reports patient has had increased unilateral R leg swelling, redness, SOB ~ 1 week. - he had r/o for DVT. Currently being treated for cellulitis w/ doxycycline. Course started Thursday 5/12, on 10 day treatment.   Daughter concerned, notes  redness in leg has improved but swelling persists.   She is concerned d/t SOB. Informed her I would defer to Dr. Debara Pickett for recommendations. Last seen in office February by Dr. Sallyanne Kuster for PM check -- she also wants to know when pt needs to be seen again?

## 2014-07-24 DIAGNOSIS — M2550 Pain in unspecified joint: Secondary | ICD-10-CM | POA: Diagnosis not present

## 2014-07-24 DIAGNOSIS — E039 Hypothyroidism, unspecified: Secondary | ICD-10-CM | POA: Diagnosis not present

## 2014-07-24 DIAGNOSIS — D472 Monoclonal gammopathy: Secondary | ICD-10-CM | POA: Diagnosis not present

## 2014-07-24 DIAGNOSIS — M6281 Muscle weakness (generalized): Secondary | ICD-10-CM | POA: Diagnosis not present

## 2014-07-24 DIAGNOSIS — M25562 Pain in left knee: Secondary | ICD-10-CM | POA: Diagnosis not present

## 2014-07-24 DIAGNOSIS — I10 Essential (primary) hypertension: Secondary | ICD-10-CM | POA: Diagnosis not present

## 2014-07-25 ENCOUNTER — Ambulatory Visit (INDEPENDENT_AMBULATORY_CARE_PROVIDER_SITE_OTHER): Payer: Medicare Other | Admitting: Internal Medicine

## 2014-07-25 ENCOUNTER — Ambulatory Visit (INDEPENDENT_AMBULATORY_CARE_PROVIDER_SITE_OTHER): Payer: Medicare Other | Admitting: *Deleted

## 2014-07-25 ENCOUNTER — Inpatient Hospital Stay (HOSPITAL_COMMUNITY)
Admission: RE | Admit: 2014-07-25 | Discharge: 2014-07-25 | Disposition: A | Discharge status: Home / Self Care | Payer: Medicare Other | Source: Ambulatory Visit | Attending: Internal Medicine | Admitting: Internal Medicine

## 2014-07-25 ENCOUNTER — Encounter: Payer: Self-pay | Admitting: Internal Medicine

## 2014-07-25 ENCOUNTER — Telehealth: Payer: Self-pay | Admitting: *Deleted

## 2014-07-25 DIAGNOSIS — R609 Edema, unspecified: Secondary | ICD-10-CM

## 2014-07-25 DIAGNOSIS — R600 Localized edema: Secondary | ICD-10-CM

## 2014-07-25 DIAGNOSIS — I482 Chronic atrial fibrillation, unspecified: Secondary | ICD-10-CM

## 2014-07-25 DIAGNOSIS — I5022 Chronic systolic (congestive) heart failure: Secondary | ICD-10-CM

## 2014-07-25 DIAGNOSIS — I255 Ischemic cardiomyopathy: Secondary | ICD-10-CM

## 2014-07-25 DIAGNOSIS — R0602 Shortness of breath: Secondary | ICD-10-CM

## 2014-07-25 DIAGNOSIS — Z79899 Other long term (current) drug therapy: Secondary | ICD-10-CM

## 2014-07-25 DIAGNOSIS — I2510 Atherosclerotic heart disease of native coronary artery without angina pectoris: Secondary | ICD-10-CM

## 2014-07-25 DIAGNOSIS — Z950 Presence of cardiac pacemaker: Secondary | ICD-10-CM

## 2014-07-25 LAB — CUP PACEART INCLINIC DEVICE CHECK
Battery Voltage: 2.96 V
Brady Statistic RV Percent Paced: 41.43 %
Date Time Interrogation Session: 20160520151325
Lead Channel Impedance Value: 432 Ohm
Lead Channel Impedance Value: 448 Ohm
Lead Channel Sensing Intrinsic Amplitude: 9.818 mV
Lead Channel Setting Pacing Amplitude: 2.5 V
MDC IDC SET LEADCHNL RV PACING PULSEWIDTH: 0.4 ms
MDC IDC SET LEADCHNL RV SENSING SENSITIVITY: 0.9 mV
MDC IDC SET ZONE DETECTION INTERVAL: 350 ms
Zone Setting Detection Interval: 400 ms

## 2014-07-25 MED ORDER — FUROSEMIDE 40 MG PO TABS: 40.0000 mg | ORAL_TABLET | Freq: Two times a day (BID) | ORAL | Status: DC

## 2014-07-25 NOTE — Progress Notes (Signed)
OFFICE NOTE  Chief Complaint:  Worsening leg edema and shortness of breath  Primary Care Physician: Jon Dooms, MD  HPI:  Jon [name]  is a 81 yo male formerly followed by Dr. Rex [name] and recently seen by Jon Kicks, NP, for left lower extremity edema with discoloration of his toes, was beginning to be uncomfortable for him to walk. We did venous Dopplers. He was quite concerned about arterial blood supply. His mother ended up with bilateral amputations. Venous Dopplers showed a ruptured baker cyst, no DVT. He was instructed on this and since that time his symptoms have resolved completely. He is on Coumadin for paroxysmal A-fib which led to the discoloration in his toes. Additionally, he has a history of abdominal aortic aneurysm and because he was concerned about his arterial disease, if he had it, we did Dopplers.  He is here for the results of the Dopplers. Bilateral ABIs were 1.0, though he does have appearance of an occluded right anterior tibial artery and a left posterior tib demonstrated a short segment of occlusive disease with reconstitution at the ankle. His pulses have been 2+. He has no claudication symptoms. Additionally, the duplex of his abdominal aorta was slightly, minimally changed from his last one. Previously it was 3.95 x 3.57, now he is 3.7 x 4.0, essentially the same, very slight increase. He has no abdominal pain and no other complaints. He has really no complaints today, feels quite well. He is not aware of any tachycardias or palpitations. He has a Medtronic pacemaker which was interrogated today. This demonstrates a battery voltage of 3.0V.  His a-fib burden is 1.6% and he is on amiodarone.  Other history includes bypass grafting in 1989; vein graft was stented in 2008. Last stress test was 2012, low risk study. EF was 49%. Pacemaker was placed for paroxysmal A-fib and bradycardia, sick sinus syndrome in 2011. He is also on warfarin.   Jon [name] underwent  PFTs on 10/22/2012. This showed a moderately severe restrictive limitation as well as moderately reduced diffusion capacity. Based on these findings I recommended discontinuing his amiodarone and he also stopped his pravastatin. Since that time he's noted that he feels quite a bit better including some minor improvement in his knee pain. He still feels like his left knee is bothering him and he is status post right TKR. 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 I did refer him to the power pulmonary, but has not been contacted for appointment so far.  Since his last followup, Jon [name] was seen by Dr. Sallyanne [name] for a device check in his device appeared to be working properly. He was having atrial fibrillation, but a little burden. He continued on warfarin which was generally therapeutic. Unfortunately, in December he had a thalamic stroke which resulted in some right facial droop and hemiparesis. That is improving , but slowly. He was then changed to Eliquis for anticoagulation. He was also started on Zetia, as he has a history of statin intolerance in the past.  An echo performed in the hospital demonstrated an EF of 40-45%, which is mildly reduced from previous studies. In addition, he has had a small amount of weight gain and lower extremity swelling which is noted over the past several weeks while in recovery.  Unfortunately, he suffered another stroke last month around the time that we plan to admit him for tikosyn induction. He is still trying to recover from that.  Jon [name] appears euvolemic today. He does have a small amount of lower extremity swelling. His weight has been stable. He is reporting some epistaxis. He is concerned about the dose of his Eliquis. He is also reporting some urinary incontinence which is new. There is some flank pain and there is some possible concern for urinary tract infection however he is awaiting the results of a urinalysis.  Jon [name] was seen in  the office today as an add-on for worsening swelling and shortness of breath. I reviewed notes from [facility] which indicate in January because of excessive urination he requested to come off of his diuretics. He was taken off the diuretics but quickly developed swelling and worsening shortness of breath. A BNP was obtained which was greater than 300. He was restarted on his diuretics and had some improvement in his shortness of breath but still has persistent symptoms. He's also developed asymmetric right lower extremity edema. He underwent an in office Doppler ultrasound that was negative for DVT. Those results are communicated in the office notes, but no formal radiology report is available. It would be unlikely for him to have DVT given the fact that he's on Eliquis. He still has persistent right lower extremity edema. He reports shortness of breath, fatigue and difficulty speaking. He denies any chest pain.  He is also overdue for pacemaker check and that was performed in the office today under my supervision.  PMHx:  Past Medical History  Diagnosis Date  . Hypertension   . Coronary artery disease   . Arthritis   . Cataract   . Anxiety   . Blood transfusion without reported diagnosis   . Hyperlipidemia   . Heart murmur   . Allergy   . Thyroid disease   . PAF (paroxysmal atrial fibrillation)     coumadin  . History of abdominal aortic aneurysm   . History of echocardiogram 12/2008    EF >55%; mild concentric LVH; mild MR; mild-mod TR; mild AV regurg;   . History of nuclear stress test 12/2010    lexiscan; low risk; compared to prior study, perfusion improved  . History of Doppler ultrasound 05/22/2012    LEAs; R anterior tibial artery appeared occluded; L posterior tibial shows short segment of occlusive ds  . History of Doppler ultrasound 05/22/2012    Abdominal Aortic Doppler; slight increase in fusiform aneurysm   . Stroke   . Adjustment disorder with mixed anxiety and  depressed mood 04/18/2013    Post CVA depression and anxiety   . Abnormality of gait 04/18/2013  . 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 Increased exposure of the right cornea.   . Pruritic condition 01/24/2013  . Memory change 01/24/2013  . Abnormal PFTs 10/30/2012    Followed in Pulmonary clinic/ [facility]/ [place]  - PFT's 10/22/12 VC  55% no obst, DLCO 50%  - PFT's 12/20/2012 VC 83% and no obst, dLCO 55%  -11/08/2012  Walked RA x 3 laps @ 185 ft each stopped due to  End of study, not desat -11/08/12 esr 10    . Impotence of organic origin 10/03/2000  . Osteoarthrosis, unspecified whether generalized or localized, unspecified site 02/05/2003  . Hypertrophy of prostate with urinary obstruction and other lower urinary tract symptoms (LUTS) 04/28/2004  . Gout, unspecified 11/03/2004  . Acute on chronic systolic congestive heart failure, NYHA class 2 03/16/2006    BNP 376.9 05/28/13   . Atherosclerosis of renal artery 10/18/2006  .  Unspecified vitamin D deficiency 10/18/2006  . Obstructive sleep apnea (adult) (pediatric) 07/23/2008  . Major depressive disorder, single episode, unspecified 03/05/2009  . Basal cell carcinoma of skin of other and unspecified parts of face 12/24/2009  . Internal hemorrhoids without mention of complication 66/29/4765  . Muscle weakness (generalized) 03/10/2011  . Pain in joint, lower leg 08/04/2011  . Nocturia 10/27/2011  . Aortic aneurysm of unspecified site without mention of rupture 10/27/2011  . Fall 03/04/2011  . Hyponatremia 03/04/2011  . TIA (transient ischemic attack) 04/30/2013  . Fatigue 04/18/2013  . 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 CVA - Rt brain stroke 12/14 02/18/2013    02/19/13 angiography CT head:  Diffuse atherosclerotic irregularity and plaque formation of the distal right common carotid artery and proximal internal carotid artery without significant stenosis. Plaque  ulceration is present and is a source of emboli. A small right thalamic CVA    . Dyslipidemia 11/03/2004  . Type II or unspecified type diabetes mellitus without mention of complication, not stated as uncontrolled 11/24/2004  . Spinal stenosis in cervical region 12/15/2004  . Right bundle branch block 04/19/2006  . Cardiac pacemaker in situ- MDT 10/11 03/18/2010    Medtronic revo implant in October 2011. Severe sinus bradycardia in the 30s, but not truly pacemaker dependent   . Long term (current) use of anticoagulants 03/17/2011  . Hypothyroidism 03/04/2011  . HTN (hypertension) 03/04/2011  . Atrial fibrillation 03/04/2011     He was on coumadin until his stroke last December and was switched to apixaban 2.$RemoveBefor'5mg'DIaddklEPQKe$  daily which he has been taking faithfully without reported side effects.    . SSS (sick sinus syndrome) 04/08/2014  . DM (diabetes mellitus), type 2 with renal complications 4/65/0354  . CKD stage 2 due to type 2 diabetes mellitus 07/03/2014    Past Surgical History  Procedure Laterality Date  . Coronary artery bypass graft  1989  . Pacemaker insertion  2011  . Insert / replace / remove pacemaker    . Eye surgery    . Joint replacement    . Hernia repair    . Coronary angioplasty with stent placement  2008    stent to SVG to OM    FAMHx:  Family History  Problem Relation Age of Onset  . Diabetes Father   . Other Mother     PAD with amputations    SOCHx:   reports that he quit smoking about 47 years ago. His smoking use included Cigarettes. He has a 11.25 pack-year smoking history. He has never used smokeless tobacco. He reports that he does not drink alcohol or use illicit drugs.  ALLERGIES:  Allergies  Allergen Reactions  . Caduet [Amlodipine-Atorvastatin] Other (See Comments)    Weakness   . Crestor [Rosuvastatin] Other (See Comments)    Muscle pain/weakness  . Cymbalta [Duloxetine Hcl] Other (See Comments)    Excessive sedation  . Lipitor [Atorvastatin Calcium]  Other (See Comments)    Muscle pain/weakness  . Lisinopril Other (See Comments)    Doesn't remember reaction  . Quinolones     Risk of rupture of AAA  . Simvastatin Other (See Comments)    Muscle pain/weakness    ROS: A comprehensive review of systems was negative except for: Respiratory: positive for dyspnea on exertion Cardiovascular: positive for irregular heart beat and lower extremity edema  HOME MEDS: Current Outpatient Prescriptions  Medication Sig Dispense Refill  . acetaminophen (  TYLENOL) 325 MG tablet Take 2 tablets (650 mg total) by mouth 2 (two) times daily. DO NOT EXCEED 4 TABLETS DAILY (Patient taking differently: Take 650 mg by mouth. DO NOT EXCEED 4 TABLETS DAILY take 2 tablets every 6 hours as needed for pain) 360 tablet 1  . allopurinol (ZYLOPRIM) 100 MG tablet Take 50 mg by mouth daily.     Marland Kitchen amoxicillin (AMOXIL) 500 MG capsule Take 500 mg by mouth. Take 4 caps prior to dential    . apixaban (ELIQUIS) 2.5 MG TABS tablet Take 1 tablet (2.5 mg total) by mouth 2 (two) times daily. 60 tablet 11  . Cholecalciferol (VITAMIN D) 400 UNITS capsule Take 400 Units by mouth 2 (two) times daily.     Marland Kitchen DOXYCYCLINE PO Take by mouth.    . furosemide (LASIX) 40 MG tablet Take 1 tablet (40 mg total) by mouth 2 (two) times daily. 180 tablet 3  . ipratropium (ATROVENT) 0.06 % nasal spray One puff in each nostril before each meal 15 mL 12  . levothyroxine (SYNTHROID, LEVOTHROID) 100 MCG tablet Take 100 mcg by mouth daily before breakfast.    . LORazepam (ATIVAN) 0.5 MG tablet Take one tablet by mouth at bedtime (Patient taking differently: Take one (0.25 mg) tablet by mouth at bedtime) 30 tablet 5  . losartan (COZAAR) 100 MG tablet Take 1 tablet (100 mg total) by mouth daily. 30 tablet 5  . metoprolol tartrate (LOPRESSOR) 25 MG tablet Take 3 tablets (75 mg total) by mouth 2 (two) times daily. 120 tablet 0  . nitroGLYCERIN (NITROSTAT) 0.4 MG SL tablet Place 0.4 mg under the tongue every 5  (five) minutes as needed for chest pain.     Marland Kitchen olopatadine (PATANOL) 0.1 % ophthalmic solution One drop in each eye twice daily to help conjunctival irritation 5 mL 12  . polyethylene glycol (MIRALAX / GLYCOLAX) packet Take 17 g by mouth daily.    . polyvinyl alcohol (LIQUIFILM TEARS) 1.4 % ophthalmic solution Place 1 drop into both eyes. One drop in both eyes twice daily as needed for dry eyes    . tiZANidine (ZANAFLEX) 2 MG tablet Take 2 mg by mouth as needed for muscle spasms.    . traMADol (ULTRAM) 50 MG tablet Take 50 mg by mouth every 6 (six) hours as needed. for pain  0  . Triamcinolone Acetonide (KENALOG IN ORABASE MT) Use as directed 5 mg in the mouth or throat.     No current facility-administered medications for this visit.    LABS/IMAGING: No results found for this or any previous visit (from the past 48 hour(s)). No results found.  VITALS: There were no vitals taken for this visit.  EXAM: GEN: Awake, NAD HEENT: PERRLA, right lid appears retracted, right eye proptotic PULM: lungs with decreased BS, particularly at the right base with crackles CV: Regular rate and rhythm, s1/s2 GI: Abdomen soft, non-tender, +BS VASCULAR: 2+ pulses, right CEA incision noted NEURO: right facial droop, slightly slurred speech PSYCH: Mood, affect normal, pleasant MSK: good ROM, strength 5/5 LE bilaterally, right handgrip 4/5 DERM: chronic LE venous stasis changes EXT: 2+ right lower extremity edema and trace left lower extremity edema  EKG: V-paced at 62, underlying a-fib  ASSESSMENT: 1. Acute on chronic systolic, congestive heart failure, NYHA Class II symptoms 2. Persistent atrial fibrillation 3. Sick sinus syndrome status post pacemaker placement with normal function 4. Abnormal PFT's - off of amiodarone 5. History of abdominal aortic aneurysm with mild enlargement 6.  Coronary artery disease status post CABG in 1989 with stent to the vein graft in 2008 7. Minimal PAD with normal  ABIs bilaterally 8. Recent thalamic stroke - now on Eliquis 9. Acute on chronic, systolic congestive heart failure - EF 40-45% 10. Epistaxis 11. Urinary incontinence  PLAN: 1.   Jon [name] has had decompensation which is likely heart failure. He stopped his diuretics per his own wishes due to excessive urination. He seen a urologist and is on Flomax. Restarting his diuretic has improved some of his symptoms but he remained short of breath. He does have asymmetric lower extremity edema. He reported doing really well on compression stockings but apparently they were stopped because of improvement in his swelling. The stockings are necessary to help keep his swelling from coming back, at least in the right leg. I recommend increasing his Lasix to 40 mg twice a day. Will repeat a BMP and BNP next week. I also like to obtain another echocardiogram as it's been 2 years since we reassessed his systolic function. If he's had progressively worsening cardiomyopathy, that could explain his shortness of breath and increased swelling. Family has also requested a new powered lift chair. Eyes happy to provide a prescription for that. It's very difficult for him to get out of a chair and walk and his chair is not functioning correctly. This will significantly improve his ADLs. Plan to see him back in one month.  Pixie Casino, MD, [facility] Attending Cardiologist [facility] 07/25/2014, 1:11 PM

## 2014-07-25 NOTE — Telephone Encounter (Signed)
Received CMN Form for E0627 NU Seat Lift Chair for patient. To be faxed to [facility] when completed Fax#: [phone]. Given to Dr. Nyoka Cowden to review and sign.

## 2014-07-25 NOTE — Patient Instructions (Addendum)
Medication Changes - INCREASE LASIX to 40mg  TWICE DAILY  Your physician recommends that you return for lab work in [place] (BNP, BMET)  Your physician has requested that you have an echocardiogram. Echocardiography is a painless test that uses sound waves to create images of your heart. It provides your doctor with information about the size and shape of your heart and how well your heart's chambers and valves are working. This procedure takes approximately one hour. There are no restrictions for this procedure.  Your physician recommends that you schedule a follow-up appointment in: 1 month.  Dr Debara Pickett has provided a written Rx for powered lift chair - I25.5

## 2014-07-29 DIAGNOSIS — I10 Essential (primary) hypertension: Secondary | ICD-10-CM | POA: Diagnosis not present

## 2014-07-29 DIAGNOSIS — I5021 Acute systolic (congestive) heart failure: Secondary | ICD-10-CM | POA: Diagnosis not present

## 2014-07-29 LAB — BASIC METABOLIC PANEL
BUN: 26 mg/dL — AB (ref 4–21)
CREATININE: 0.9 mg/dL (ref 0.6–1.3)
Glucose: 97 mg/dL
Potassium: 4.2 mmol/L (ref 3.4–5.3)
SODIUM: 138 mmol/L (ref 137–147)

## 2014-07-31 ENCOUNTER — Telehealth: Payer: Self-pay

## 2014-07-31 ENCOUNTER — Non-Acute Institutional Stay: Payer: Medicare Other | Admitting: Internal Medicine

## 2014-07-31 ENCOUNTER — Encounter: Payer: Self-pay | Admitting: Internal Medicine

## 2014-07-31 VITALS — BP 110/66 | HR 64 | Temp 97.8°F | Wt 151.0 lb

## 2014-07-31 DIAGNOSIS — S81801D Unspecified open wound, right lower leg, subsequent encounter: Secondary | ICD-10-CM

## 2014-07-31 DIAGNOSIS — I10 Essential (primary) hypertension: Secondary | ICD-10-CM | POA: Diagnosis not present

## 2014-07-31 DIAGNOSIS — R350 Frequency of micturition: Secondary | ICD-10-CM | POA: Diagnosis not present

## 2014-07-31 DIAGNOSIS — S81001D Unspecified open wound, right knee, subsequent encounter: Secondary | ICD-10-CM

## 2014-07-31 DIAGNOSIS — S91001D Unspecified open wound, right ankle, subsequent encounter: Secondary | ICD-10-CM

## 2014-07-31 DIAGNOSIS — I5022 Chronic systolic (congestive) heart failure: Secondary | ICD-10-CM

## 2014-07-31 DIAGNOSIS — R609 Edema, unspecified: Secondary | ICD-10-CM

## 2014-07-31 DIAGNOSIS — M170 Bilateral primary osteoarthritis of knee: Secondary | ICD-10-CM | POA: Diagnosis not present

## 2014-07-31 DIAGNOSIS — M6281 Muscle weakness (generalized): Secondary | ICD-10-CM | POA: Diagnosis not present

## 2014-07-31 DIAGNOSIS — R29818 Other symptoms and signs involving the nervous system: Secondary | ICD-10-CM | POA: Diagnosis not present

## 2014-07-31 DIAGNOSIS — S81009A Unspecified open wound, unspecified knee, initial encounter: Secondary | ICD-10-CM

## 2014-07-31 DIAGNOSIS — I255 Ischemic cardiomyopathy: Secondary | ICD-10-CM | POA: Diagnosis not present

## 2014-07-31 DIAGNOSIS — M25562 Pain in left knee: Secondary | ICD-10-CM | POA: Diagnosis not present

## 2014-07-31 DIAGNOSIS — R5382 Chronic fatigue, unspecified: Secondary | ICD-10-CM | POA: Diagnosis not present

## 2014-07-31 DIAGNOSIS — D472 Monoclonal gammopathy: Secondary | ICD-10-CM | POA: Diagnosis not present

## 2014-07-31 DIAGNOSIS — M2550 Pain in unspecified joint: Secondary | ICD-10-CM | POA: Diagnosis not present

## 2014-07-31 DIAGNOSIS — E039 Hypothyroidism, unspecified: Secondary | ICD-10-CM | POA: Diagnosis not present

## 2014-07-31 DIAGNOSIS — S91009A Unspecified open wound, unspecified ankle, initial encounter: Secondary | ICD-10-CM

## 2014-07-31 DIAGNOSIS — S81809A Unspecified open wound, unspecified lower leg, initial encounter: Secondary | ICD-10-CM

## 2014-07-31 DIAGNOSIS — R2689 Other abnormalities of gait and mobility: Secondary | ICD-10-CM

## 2014-07-31 HISTORY — DX: Other abnormalities of gait and mobility: R26.89

## 2014-07-31 HISTORY — DX: Muscle weakness (generalized): M62.81

## 2014-07-31 HISTORY — DX: Unspecified open wound, unspecified knee, initial encounter: S81.009A

## 2014-07-31 HISTORY — DX: Bilateral primary osteoarthritis of knee: M17.0

## 2014-07-31 NOTE — Progress Notes (Signed)
Patient ID: Jon [name], male   DOB: [birth year], 81 y.o.   MRN: [id number]    [facility]     [facility] of Service[facility] ([facility]     Allergies  Allergen Reactions  . Caduet [Amlodipine-Atorvastatin] Other (See Comments)    Weakness   . Crestor [Rosuvastatin] Other (See Comments)    Muscle pain/weakness  . Cymbalta [Duloxetine Hcl] Other (See Comments)    Excessive sedation  . Lipitor [Atorvastatin Calcium] Other (See Comments)    Muscle pain/weakness  . Lisinopril Other (See Comments)    Doesn't remember reaction  . Quinolones     Risk of rupture of AAA  . Simvastatin Other (See Comments)    Muscle pain/weakness    Chief Complaint  Patient presents with  . Medical Management of Chronic Issues    CHF, fatigue, back pain, right lower leg wound    HPI:  Primary osteoarthritis of both knees: Worsening pain to bilateral knees, taking Tylenol and Tramadol with moderate relief  Quadriceps weakness: Unable to stand from sitting position without assistance   Balance disorder: Unsteady gait, must use assistive devices to steady self with ambulation  Chronic systolic congestive heart failure: Lasix increased to 80 mg daily. Denies shortness of breath today. Reports swelling of RLE improved.   Edema: to lower extremities, R>L  Chronic fatigue: feels has no energy.  Urinary frequency: Ongoing issue, has had increased urgency and bouts of incontinence since increase of Lasix  Open wound of knee, leg (except thigh), and ankle, complicated, right, subsequent encounter: Nickel sized ulceration to right posterior achilles. Unsure how got this, pressure vs. Injury. No fevers, erythema or warmth. Daily dressing changes with application of Bactroban.    Medications: Patient's Medications  New Prescriptions   No medications on file  Previous Medications   ACETAMINOPHEN (TYLENOL) 325 MG TABLET    Take 2 tablets (650 mg total) by mouth 2 (two) times daily. DO NOT  EXCEED 4 TABLETS DAILY   ALLOPURINOL (ZYLOPRIM) 100 MG TABLET    Take 50 mg by mouth daily.    AMOXICILLIN (AMOXIL) 500 MG CAPSULE    Take 500 mg by mouth. Take 4 caps prior to dential   APIXABAN (ELIQUIS) 2.5 MG TABS TABLET    Take 1 tablet (2.5 mg total) by mouth 2 (two) times daily.   CHOLECALCIFEROL (VITAMIN D) 400 UNITS CAPSULE    Take 400 Units by mouth 2 (two) times daily.    DOXYCYCLINE PO    Take by mouth.   FUROSEMIDE (LASIX) 40 MG TABLET    Take 1 tablet (40 mg total) by mouth 2 (two) times daily.   IPRATROPIUM (ATROVENT) 0.06 % NASAL SPRAY    One puff in each nostril before each meal   LEVOTHYROXINE (SYNTHROID, LEVOTHROID) 100 MCG TABLET    Take 100 mcg by mouth daily before breakfast.   LORAZEPAM (ATIVAN) 0.5 MG TABLET    Take one tablet by mouth at bedtime   LOSARTAN (COZAAR) 100 MG TABLET    Take 1 tablet (100 mg total) by mouth daily.   METOPROLOL TARTRATE (LOPRESSOR) 25 MG TABLET    Take 3 tablets (75 mg total) by mouth 2 (two) times daily.   NITROGLYCERIN (NITROSTAT) 0.4 MG SL TABLET    Place 0.4 mg under the tongue every 5 (five) minutes as needed for chest pain.    NUTRITIONAL SUPPLEMENTS (BOOST COMPACT) LIQD    Take by mouth. 237 mls daily   OLOPATADINE (PATANOL) 0.1 % OPHTHALMIC  SOLUTION    One drop in each eye twice daily to help conjunctival irritation   POLYETHYLENE GLYCOL (MIRALAX / GLYCOLAX) PACKET    Take 17 g by mouth daily.   POLYVINYL ALCOHOL (LIQUIFILM TEARS) 1.4 % OPHTHALMIC SOLUTION    Place 1 drop into both eyes. One drop in both eyes twice daily as needed for dry eyes   TIZANIDINE (ZANAFLEX) 2 MG TABLET    Take 2 mg by mouth as needed for muscle spasms.   TRAMADOL (ULTRAM) 50 MG TABLET    Take 50 mg by mouth every 6 (six) hours as needed. for pain   TRIAMCINOLONE ACETONIDE (KENALOG IN ORABASE MT)    Use as directed 5 mg in the mouth or throat.  Modified Medications   No medications on file  Discontinued Medications   No medications on file     Review of  Systems  Constitutional: Positive for fatigue. Negative for fever.  HENT: Positive for hearing loss and voice change (dysphonia since his stroke.). Negative for congestion, ear discharge, ear pain, rhinorrhea, sinus pressure and tinnitus.   Eyes: Negative.   Respiratory: Negative for cough, choking, chest tightness, shortness of breath and wheezing.   Cardiovascular: Negative for palpitations and leg swelling (sometimes, not apparent today. ).  Gastrointestinal: Negative for abdominal pain, diarrhea and abdominal distention.       Chokes on swallowing since his CVA. Episodes of fecal incontinence. Hx of hemorrhoids.  Endocrine:       Hypothyroid. Mild elevations in blood sugars, although not on every check. Not using medications to treat diabetes, but does avoid sweets in the diet.  Genitourinary: Positive for urgency and frequency.       Incontinent from time to time. Followed by Dr. Matilde Sprang.  Musculoskeletal: Positive for back pain, arthralgias and gait problem. Negative for neck pain and neck stiffness.  Skin: Negative for pallor and rash.       Superficial wound right Achilles tendon area.  Allergic/Immunologic: Negative.   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 Recurrent CVA on 04/30/13 of the left MCA area  Hematological: Negative.   Psychiatric/Behavioral: Negative.     Filed Vitals:   07/31/14 0854  BP: 110/66  Pulse: 64  Temp: 97.8 F (36.6 C)  TempSrc: Oral  Weight: 151 lb (68.493 kg)  SpO2: 97%   Body mass index is 22.96 kg/(m^2).  Physical Exam  Constitutional: He is oriented to person, place, and time. He appears well-developed and well-nourished. No distress.  HENT:  Severe hearing loss. Bilateral aides.Right facial droop.   Eyes:  Corrective lenses. Increased exposure of the right cornea due to post CVA facial droop.  Neck: No JVD present. No tracheal deviation present. No thyromegaly  present.  Cardiovascular: Normal rate and normal heart sounds.  Frequent extrasystoles are present.  No murmur heard. Pacemaker left upper chest  Pulmonary/Chest: Effort normal. He has no wheezes. He has rales.  dry rales posterior lung bases.   Abdominal: Soft. Bowel sounds are normal. He exhibits no distension and no mass. There is no tenderness.  Musculoskeletal: Normal range of motion. He exhibits edema. He exhibits no tenderness.  Unstable gait. Using 4 wheel walker and w/c. Weaker on the right side. Hx of the right total knee replacement.  1+ edema to RLE  Lymphadenopathy:    He has no cervical adenopathy.  Neurological: He is alert and oriented to person, place, and time. He displays normal  reflexes. No cranial nerve deficit. He exhibits normal muscle tone. Coordination normal.  Slurred speech. Right side weakness. C/o left foot spasm   Skin: Skin is warm and dry. No rash noted. No erythema. No pallor.  Superficial wound right Achilles tendon area.  Psychiatric: He has a normal mood and affect. His behavior is normal. Judgment and thought content normal.  Nursing note and vitals reviewed.    Labs reviewed: Nursing Home on 07/17/2014  Component Date Value Ref Range Status  . Glucose 07/15/2014 102   Final  . BUN 07/15/2014 24* 4 - 21 mg/dL Final  . Creatinine 07/15/2014 0.9  0.6 - 1.3 mg/dL Final  . Potassium 07/15/2014 4.0  3.4 - 5.3 mmol/L Final  . Sodium 07/15/2014 136* 137 - 147 mmol/L Final  Lab on 07/03/2014  Component Date Value Ref Range Status  . Glucose 06/27/2014 110   Final  . BUN 06/27/2014 26* 4 - 21 mg/dL Final  . Creatinine 06/27/2014 0.9  0.6 - 1.3 mg/dL Final  . Potassium 06/27/2014 4.5  3.4 - 5.3 mmol/L Final  . Sodium 06/27/2014 136* 137 - 147 mmol/L Final  . TSH 06/27/2014 4.55  0.41 - 5.90 uIU/mL Final  Nursing Home on 06/12/2014  Component Date Value Ref Range Status  . Hemoglobin 05/30/2014 13.9  13.5 - 17.5 g/dL Final  . HCT 05/30/2014 41  41 -  53 % Final  . Platelets 05/30/2014 234  150 - 399 K/L Final  . WBC 05/30/2014 7.2   Final  . Glucose 05/30/2014 110   Final  . BUN 05/30/2014 23* 4 - 21 mg/dL Final  . Creatinine 05/30/2014 0.9  0.6 - 1.3 mg/dL Final  . Potassium 05/30/2014 4.1  3.4 - 5.3 mmol/L Final  . Sodium 05/30/2014 134* 137 - 147 mmol/L Final  . Alkaline Phosphatase 05/30/2014 93  25 - 125 U/L Final  . ALT 05/30/2014 10  10 - 40 U/L Final  . AST 05/30/2014 14  14 - 40 U/L Final     Assessment/Plan   1. Primary osteoarthritis of both knees Ongoing, manages with Tylenol and Tramadol  2. Quadriceps weakness Medical form sent for Lift Chair  3. Balance disorder Continue using walker, wheel chair and assistive devices for safety  4. Chronic systolic congestive heart failure Compensated, Taking 80 mg Lasix daily.  5. Edema Improved with increased Lasix and compression stockings  6. Chronic fatigue Multifactorial: Congestive heart failure, dyspnea, post stroke, poor balance and increased work of walking  7. Urinary frequency Worsened by Lasix, tolerating ok  8. Open wound of knee, leg (except thigh), and ankle, complicated, right, subsequent encounter Continue bactroban and daily dressing changes

## 2014-07-31 NOTE — Telephone Encounter (Signed)
Dr. Nyoka Cowden completed form Certificate of Delight for seat lift faxed to [phone].

## 2014-08-01 ENCOUNTER — Ambulatory Visit (HOSPITAL_COMMUNITY)
Admission: RE | Admit: 2014-08-01 | Discharge: 2014-08-01 | Disposition: A | Discharge status: Home / Self Care | Payer: Medicare Other | Source: Ambulatory Visit | Attending: Cardiology | Admitting: Cardiology

## 2014-08-01 DIAGNOSIS — I255 Ischemic cardiomyopathy: Secondary | ICD-10-CM

## 2014-08-01 DIAGNOSIS — I517 Cardiomegaly: Secondary | ICD-10-CM | POA: Insufficient documentation

## 2014-08-01 DIAGNOSIS — M25562 Pain in left knee: Secondary | ICD-10-CM | POA: Diagnosis not present

## 2014-08-01 DIAGNOSIS — M6281 Muscle weakness (generalized): Secondary | ICD-10-CM | POA: Diagnosis not present

## 2014-08-01 DIAGNOSIS — D472 Monoclonal gammopathy: Secondary | ICD-10-CM | POA: Diagnosis not present

## 2014-08-01 DIAGNOSIS — I4891 Unspecified atrial fibrillation: Secondary | ICD-10-CM | POA: Diagnosis not present

## 2014-08-01 DIAGNOSIS — E039 Hypothyroidism, unspecified: Secondary | ICD-10-CM | POA: Diagnosis not present

## 2014-08-01 DIAGNOSIS — M2550 Pain in unspecified joint: Secondary | ICD-10-CM | POA: Diagnosis not present

## 2014-08-01 DIAGNOSIS — I10 Essential (primary) hypertension: Secondary | ICD-10-CM | POA: Diagnosis not present

## 2014-08-01 NOTE — Progress Notes (Signed)
Pacemaker check in clinic via 360 w/Dr.Hilty. Device function reviewed. Impedance and sensing consistent with previous measurements. Histograms appropriate for patient and level of activity. All other diagnostic data reviewed and is appropriate and stable for patient. Real time EGM shows appropriate sensing and capture. Permanent AF + Eliquis. No ventricular high rate episodes. Batt voltage 2.96V (ERI 2.81V). Plan to follow up via [facility] on 8/22 and with [facility] in 04-2015.

## 2014-08-05 ENCOUNTER — Other Ambulatory Visit: Payer: Self-pay | Admitting: Nurse Practitioner

## 2014-08-05 DIAGNOSIS — I10 Essential (primary) hypertension: Secondary | ICD-10-CM | POA: Diagnosis not present

## 2014-08-05 DIAGNOSIS — M2550 Pain in unspecified joint: Secondary | ICD-10-CM | POA: Diagnosis not present

## 2014-08-05 DIAGNOSIS — M6281 Muscle weakness (generalized): Secondary | ICD-10-CM | POA: Diagnosis not present

## 2014-08-05 DIAGNOSIS — M25562 Pain in left knee: Secondary | ICD-10-CM | POA: Diagnosis not present

## 2014-08-05 DIAGNOSIS — N182 Chronic kidney disease, stage 2 (mild): Principal | ICD-10-CM

## 2014-08-05 DIAGNOSIS — I5022 Chronic systolic (congestive) heart failure: Secondary | ICD-10-CM

## 2014-08-05 DIAGNOSIS — E1122 Type 2 diabetes mellitus with diabetic chronic kidney disease: Secondary | ICD-10-CM

## 2014-08-05 DIAGNOSIS — D472 Monoclonal gammopathy: Secondary | ICD-10-CM | POA: Diagnosis not present

## 2014-08-05 DIAGNOSIS — E039 Hypothyroidism, unspecified: Secondary | ICD-10-CM | POA: Diagnosis not present

## 2014-08-06 ENCOUNTER — Telehealth: Payer: Self-pay | Admitting: Internal Medicine

## 2014-08-06 NOTE — Telephone Encounter (Signed)
Spoke with patient's daughter and communicated information provided by Dr. Debara Pickett. She voiced understanding.

## 2014-08-06 NOTE — Telephone Encounter (Signed)
RV dysfunction likely the cause of shortness of breath. Not as likely a cause of extreme fatigue. This is probably multifactorial.  Dr. Lemmie Evens

## 2014-08-06 NOTE — Telephone Encounter (Signed)
Pt's daughter is calling back in to speak with [place]. She said that she had a couple more questions to ask her in regards to his test results. Please call  Thanks

## 2014-08-06 NOTE — Telephone Encounter (Signed)
Patient's daughter notified of results.   She states patient has "extreme fatigue" and is not himself. Since LV function has "normalized", she wondered if RV dysfunction could be playing a role? Explained could be HF related.. But would defer to MD for advice

## 2014-08-06 NOTE — Telephone Encounter (Signed)
Pt would like echo results from 08-01-14 please.

## 2014-08-19 ENCOUNTER — Encounter: Payer: Self-pay | Admitting: Internal Medicine

## 2014-08-21 ENCOUNTER — Non-Acute Institutional Stay: Payer: Medicare Other | Admitting: Nurse Practitioner

## 2014-08-21 ENCOUNTER — Encounter: Payer: Self-pay | Admitting: Nurse Practitioner

## 2014-08-21 DIAGNOSIS — R609 Edema, unspecified: Secondary | ICD-10-CM

## 2014-08-21 DIAGNOSIS — E1122 Type 2 diabetes mellitus with diabetic chronic kidney disease: Secondary | ICD-10-CM | POA: Diagnosis not present

## 2014-08-21 DIAGNOSIS — I63512 Cerebral infarction due to unspecified occlusion or stenosis of left middle cerebral artery: Secondary | ICD-10-CM

## 2014-08-21 DIAGNOSIS — I5022 Chronic systolic (congestive) heart failure: Secondary | ICD-10-CM

## 2014-08-21 DIAGNOSIS — K5900 Constipation, unspecified: Secondary | ICD-10-CM | POA: Diagnosis not present

## 2014-08-21 DIAGNOSIS — N401 Enlarged prostate with lower urinary tract symptoms: Secondary | ICD-10-CM

## 2014-08-21 DIAGNOSIS — R252 Cramp and spasm: Secondary | ICD-10-CM

## 2014-08-21 DIAGNOSIS — I482 Chronic atrial fibrillation, unspecified: Secondary | ICD-10-CM

## 2014-08-21 DIAGNOSIS — S81002A Unspecified open wound, left knee, initial encounter: Secondary | ICD-10-CM | POA: Diagnosis not present

## 2014-08-21 DIAGNOSIS — I10 Essential (primary) hypertension: Secondary | ICD-10-CM

## 2014-08-21 DIAGNOSIS — F4323 Adjustment disorder with mixed anxiety and depressed mood: Secondary | ICD-10-CM | POA: Diagnosis not present

## 2014-08-21 DIAGNOSIS — L299 Pruritus, unspecified: Secondary | ICD-10-CM

## 2014-08-21 DIAGNOSIS — M159 Polyosteoarthritis, unspecified: Secondary | ICD-10-CM

## 2014-08-21 DIAGNOSIS — M150 Primary generalized (osteo)arthritis: Secondary | ICD-10-CM

## 2014-08-21 DIAGNOSIS — M1000 Idiopathic gout, unspecified site: Secondary | ICD-10-CM | POA: Diagnosis not present

## 2014-08-21 DIAGNOSIS — N182 Chronic kidney disease, stage 2 (mild): Secondary | ICD-10-CM

## 2014-08-21 DIAGNOSIS — S91002A Unspecified open wound, left ankle, initial encounter: Secondary | ICD-10-CM

## 2014-08-21 DIAGNOSIS — E039 Hypothyroidism, unspecified: Secondary | ICD-10-CM

## 2014-08-21 DIAGNOSIS — S81802A Unspecified open wound, left lower leg, initial encounter: Secondary | ICD-10-CM

## 2014-08-21 NOTE — Assessment & Plan Note (Signed)
Healed

## 2014-08-21 NOTE — Assessment & Plan Note (Signed)
Rate controlled, takes Metoprolol 75mg  bid. Eliquis for VTE risk reduction.

## 2014-08-21 NOTE — Progress Notes (Signed)
Patient ID: Jon [name], male   DOB: 1921-12-09, 81 y.o.   MRN: [id number]   Code Status: DNR  Allergies  Allergen Reactions  . Caduet [Amlodipine-Atorvastatin] Other (See Comments)    Weakness   . Crestor [Rosuvastatin] Other (See Comments)    Muscle pain/weakness  . Cymbalta [Duloxetine Hcl] Other (See Comments)    Excessive sedation  . Lipitor [Atorvastatin Calcium] Other (See Comments)    Muscle pain/weakness  . Lisinopril Other (See Comments)    Doesn't remember reaction  . Quinolones     Risk of rupture of AAA  . Simvastatin Other (See Comments)    Muscle pain/weakness    Chief Complaint  Patient presents with  . Medical Management of Chronic Issues  . Acute Visit    itching left nipple to pacemaker site    HPI: Patient is a 81 y.o. male seen in the AL at [facility] today for evaluation of itching left nipple to pacemaker site and chronic medical conditions.  Problem List Items Addressed This Visit    Atrial fibrillation (Chronic)    Rate controlled, takes Metoprolol 70m bid. Eliquis for VTE risk reduction.         HTN (hypertension) (Chronic)    Metoprolol 75 mg twice a day and Losartan 1070mdaily controlled        Hypothyroidism (Chronic)    takes Levothyroxine 7533m TSH 4.551 06/27/14-scheduled TSH in 3 months.         Edema (Chronic)    Subsided since Furosemide resumed. Healed open area at [facility]        Chronic systolic congestive heart failure (Chronic)    07/29/14 BNP 256.7 Clinically compensated, continue Furosemide.       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 Takes Allopurinol 52m21mily.        Enlarged prostate with lower urinary tract symptoms (LUTS)    Worsened urinary incontinence-denied dysuria or lower abd/CVA discomfort. Will f/u Urology.        Osteoarthritis    Back pain is subsided, but overall he has joints aches and pain at  times. Prn Tylenol and Tramadol available to him.          Pruritic condition - Primary    Itching from left nipple to pacemaker w/o rash--apply TAC nightly.       Adjustment disorder with mixed anxiety and depressed mood    04/23/13 Pharm prn Ambien not used, reduce Lorazepam 0.25mg77m.         Acute ischemic left MCA stroke    Takes Eliquis bid.       Foot spasms    Mainly in his left foot. 09/17/13 Mg 2.4 and Ca 9.5 07/08/14 Mg 2/1 08/14/14 dc prn Zanaflex        Constipation    Stable since 04/15/14 MiraLax prn        Open wound of knee, leg (except thigh), and ankle, complicated    Healed.          Review of Systems:  Review of Systems  Constitutional: Negative for fever, chills and diaphoresis.  HENT: Positive for hearing loss. Negative for congestion, ear discharge, ear pain, nosebleeds, sore throat and tinnitus.   Eyes: Negative for photophobia, pain, discharge and redness.  Respiratory: Negative for cough, shortness of breath, wheezing and stridor.   Cardiovascular: Positive for leg swelling. Negative  for chest pain and palpitations.       Trace edema ankles  Gastrointestinal: Negative for nausea, vomiting, abdominal pain, diarrhea, constipation and blood in stool.  Endocrine: Negative for polydipsia.  Genitourinary: Positive for frequency. Negative for dysuria, urgency, hematuria and flank pain.  Musculoskeletal: Positive for back pain. Negative for myalgias and neck pain.  Skin: Negative for rash.       Itching from left nipple to pacemaker  Allergic/Immunologic: Negative for environmental allergies.  Neurological: Negative for dizziness, tremors, seizures, weakness and headaches.  Hematological: Bruises/bleeds easily.  Psychiatric/Behavioral: Negative for suicidal ideas and hallucinations. The patient is nervous/anxious.     Past Medical History  Diagnosis Date  . Hypertension   . Coronary artery disease   . Arthritis   . Cataract   . Anxiety     . Blood transfusion without reported diagnosis   . Hyperlipidemia   . Heart murmur   . Allergy   . Thyroid disease   . PAF (paroxysmal atrial fibrillation)     coumadin  . History of abdominal aortic aneurysm   . History of echocardiogram 12/2008    EF >55%; mild concentric LVH; mild MR; mild-mod TR; mild AV regurg;   . History of nuclear stress test 12/2010    lexiscan; low risk; compared to prior study, perfusion improved  . History of Doppler ultrasound 05/22/2012    LEAs; R anterior tibial artery appeared occluded; L posterior tibial shows short segment of occlusive ds  . History of Doppler ultrasound 05/22/2012    Abdominal Aortic Doppler; slight increase in fusiform aneurysm   . Stroke   . Adjustment disorder with mixed anxiety and depressed mood 04/18/2013    Post CVA depression and anxiety   . Abnormality of gait 04/18/2013  . 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 Increased exposure of the right cornea.   . Pruritic condition 01/24/2013  . Memory change 01/24/2013  . Abnormal PFTs 10/30/2012    Followed in Pulmonary clinic/ [facility]/ [facility]  - PFT's 10/22/12 VC  55% no obst, DLCO 50%  - PFT's 12/20/2012 VC 83% and no obst, dLCO 55%  -11/08/2012  Walked RA x 3 laps @ 185 ft each stopped due to  End of study, not desat -11/08/12 esr 10    . Impotence of organic origin 10/03/2000  . Osteoarthrosis, unspecified whether generalized or localized, unspecified site 02/05/2003  . Hypertrophy of prostate with urinary obstruction and other lower urinary tract symptoms (LUTS) 04/28/2004  . Gout, unspecified 11/03/2004  . Acute on chronic systolic congestive heart failure, NYHA class 2 03/16/2006    BNP 376.9 05/28/13   . Atherosclerosis of renal artery 10/18/2006  . Unspecified vitamin D deficiency 10/18/2006  . Obstructive sleep apnea (adult) (pediatric) 07/23/2008  . Major depressive disorder, single episode, unspecified 03/05/2009  . Basal cell carcinoma of skin of  other and unspecified parts of face 12/24/2009  . Internal hemorrhoids without mention of complication 62/83/6629  . Muscle weakness (generalized) 03/10/2011  . Pain in joint, lower leg 08/04/2011  . Nocturia 10/27/2011  . Aortic aneurysm of unspecified site without mention of rupture 10/27/2011  . Fall 03/04/2011  . Hyponatremia 03/04/2011  . TIA (transient ischemic attack) 04/30/2013  . Fatigue 04/18/2013  . 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 CVA - Rt brain stroke 12/14 02/18/2013    02/19/13 angiography CT head:  Diffuse atherosclerotic irregularity and plaque  formation of the distal right common carotid artery and proximal internal carotid artery without significant stenosis. Plaque ulceration is present and is a source of emboli. A small right thalamic CVA    . Dyslipidemia 11/03/2004  . Type II or unspecified type diabetes mellitus without mention of complication, not stated as uncontrolled 11/24/2004  . Spinal stenosis in cervical region 12/15/2004  . Right bundle branch block 04/19/2006  . Cardiac pacemaker in situ- MDT 10/11 03/18/2010    Medtronic revo implant in October 2011. Severe sinus bradycardia in the 30s, but not truly pacemaker dependent   . Long term (current) use of anticoagulants 03/17/2011  . Hypothyroidism 03/04/2011  . HTN (hypertension) 03/04/2011  . Atrial fibrillation 03/04/2011     He was on coumadin until his stroke last December and was switched to apixaban 2.66m daily which he has been taking faithfully without reported side effects.    . SSS (sick sinus syndrome) 04/08/2014  . DM (diabetes mellitus), type 2 with renal complications 44/76/5465 . CKD stage 2 due to type 2 diabetes mellitus 07/03/2014  . Osteoarthritis of both knees 07/31/2014  . Quadriceps weakness 07/31/2014  . Balance disorder 07/31/2014  . Open wound of knee, leg (except thigh), and ankle, complicated 50/35/4656  Past Surgical History  Procedure  Laterality Date  . Coronary artery bypass graft  1989  . Pacemaker insertion  2011  . Insert / replace / remove pacemaker    . Eye surgery    . Joint replacement    . Hernia repair    . Coronary angioplasty with stent placement  2008    stent to SVG to OM   Social History:   reports that he quit smoking about 47 years ago. His smoking use included Cigarettes. He has a 11.25 pack-year smoking history. He has never used smokeless tobacco. He reports that he does not drink alcohol or use illicit drugs.  Family History  Problem Relation Age of Onset  . Diabetes Father   . Other Mother     PAD with amputations    Medications: Patient's Medications  New Prescriptions   No medications on file  Previous Medications   ACETAMINOPHEN (TYLENOL) 325 MG TABLET    Take 2 tablets (650 mg total) by mouth 2 (two) times daily. DO NOT EXCEED 4 TABLETS DAILY   ALLOPURINOL (ZYLOPRIM) 100 MG TABLET    Take 50 mg by mouth daily.    AMOXICILLIN (AMOXIL) 500 MG CAPSULE    Take 500 mg by mouth. Take 4 caps prior to dential   APIXABAN (ELIQUIS) 2.5 MG TABS TABLET    Take 1 tablet (2.5 mg total) by mouth 2 (two) times daily.   CHOLECALCIFEROL (VITAMIN D) 400 UNITS CAPSULE    Take 400 Units by mouth 2 (two) times daily.    DOXYCYCLINE PO    Take by mouth.   FUROSEMIDE (LASIX) 40 MG TABLET    Take 1 tablet (40 mg total) by mouth 2 (two) times daily.   IPRATROPIUM (ATROVENT) 0.06 % NASAL SPRAY    One puff in each nostril before each meal   LEVOTHYROXINE (SYNTHROID, LEVOTHROID) 100 MCG TABLET    Take 100 mcg by mouth daily before breakfast.   LORAZEPAM (ATIVAN) 0.5 MG TABLET    Take one tablet by mouth at bedtime   LOSARTAN (COZAAR) 100 MG TABLET    Take 1 tablet (100 mg total) by mouth daily.   METOPROLOL TARTRATE (LOPRESSOR) 25 MG TABLET    Take  3 tablets (75 mg total) by mouth 2 (two) times daily.   NITROGLYCERIN (NITROSTAT) 0.4 MG SL TABLET    Place 0.4 mg under the tongue every 5 (five) minutes as needed  for chest pain.    NUTRITIONAL SUPPLEMENTS (BOOST COMPACT) LIQD    Take by mouth. 237 mls daily   OLOPATADINE (PATANOL) 0.1 % OPHTHALMIC SOLUTION    One drop in each eye twice daily to help conjunctival irritation   POLYETHYLENE GLYCOL (MIRALAX / GLYCOLAX) PACKET    Take 17 g by mouth daily.   POLYVINYL ALCOHOL (LIQUIFILM TEARS) 1.4 % OPHTHALMIC SOLUTION    Place 1 drop into both eyes. One drop in both eyes twice daily as needed for dry eyes   TIZANIDINE (ZANAFLEX) 2 MG TABLET    Take 2 mg by mouth as needed for muscle spasms.   TRAMADOL (ULTRAM) 50 MG TABLET    Take 50 mg by mouth every 6 (six) hours as needed. for pain   TRIAMCINOLONE ACETONIDE (KENALOG IN ORABASE MT)    Use as directed 5 mg in the mouth or throat.  Modified Medications   No medications on file  Discontinued Medications   No medications on file     Physical Exam: Physical Exam  Constitutional: He is oriented to person, place, and time. He appears well-developed and well-nourished. No distress.  HENT:  Severe hearing loss. Bilateral aides.Right facial droop.    Eyes:  Corrective lenses. Increased exposure of the right cornea due to post CVA facial droop.  Neck: No JVD present. No tracheal deviation present. No thyromegaly present.  Cardiovascular: Normal rate.  Frequent extrasystoles are present.  Murmur heard. Pacemaker left upper chest. Systolic murmur 5-2/7  Pulmonary/Chest: Effort normal. He has no wheezes. He has rales.  dry rales posterior lung bases.   Abdominal: Soft. Bowel sounds are normal. He exhibits no distension and no mass. There is no tenderness.  Musculoskeletal: Normal range of motion. He exhibits edema. He exhibits no tenderness.  Unstable gait. Using 4 wheel walker and w/c. Weaker on the right side. Trace edema.   Lymphadenopathy:    He has no cervical adenopathy.  Neurological: He is alert and oriented to person, place, and time. He displays normal reflexes. No cranial nerve deficit. He  exhibits normal muscle tone. Coordination normal.  Slurred speech. Right side weakness.   Skin: Skin is warm and dry. No rash noted. No erythema. No pallor.  Itching wo rash from left nipple to pacemaker.    Psychiatric: He has a normal mood and affect. His behavior is normal. Judgment and thought content normal.  Nursing note and vitals reviewed.   Filed Vitals:   08/21/14 1302  BP: 130/78  Pulse: 64  Temp: 96.4 F (35.8 C)  TempSrc: Tympanic  Resp: 20      Labs reviewed: Basic Metabolic Panel:  Recent Labs  08/29/13  12/31/13  06/27/14 07/15/14 07/29/14  NA 137  < > 136*  < > 136* 136* 138  K 4.6  < > 4.4  < > 4.5 4.0 4.2  BUN 23*  < > 19  < > 26* 24* 26*  CREATININE 1.1  < > 1.0  < > 0.9 0.9 0.9  TSH 0.62  --  2.10  --  4.55  --   --   < > = values in this interval not displayed. Liver Function Tests:  Recent Labs  05/30/14  AST 14  ALT 10  ALKPHOS 93   No results for  input(s): LIPASE, AMYLASE in the last 8760 hours. No results for input(s): AMMONIA in the last 8760 hours. CBC:  Recent Labs  10/29/13 12/23/13 05/30/14  WBC 6.8 12.2 7.2  HGB 15.6 15.0 13.9  HCT 45 43 41  PLT 252 174 234   Lipid Panel: No results for input(s): CHOL, HDL, LDLCALC, TRIG, CHOLHDL, LDLDIRECT in the last 8760 hours.   Past Procedures:  04/30/2013 CLINICAL DATA: Stroke. Ex-smoker. EXAM: CHEST 2 VIEW . IMPRESSION: Minimal bibasilar linear atelectasis.   04/30/2013 CLINICAL DATA: TRANSIENT ISCHEMIC ATTACK EXAM: CT HEAD WITHOUT CONTRAST IMPRESSION: Findings consistent with an area of subacute to chronic lacunar infarction left MCA distribution. Involutional and chronic changes also appreciated. There is no evidence of acute abnormalities.   05/01/2013 EXAM: MRI HEAD WITHOUT CONTRAST MRA HEAD WITHOUT CONTRAST IMPRESSION: 1. Small acute/subacute infarct left corona radiata. No mass effect or hemorrhage. 2. Three superimposed punctate acute lacunar infarcts  (right caudate nucleus, left occipital pole, left left parietal lobe). These could reflect synchronous small vessel ischemia rather than a recent embolic event. 3. Underlying chronic small vessel ischemia. 4. Intracranial atherosclerosis. Fusiform aneurysmal enlargement of the left ICA (cavernous segment) and right ICA terminus. No anterior circulation major branch occlusion. 5. Atherosclerosis in stenosis of the distal right vertebral artery which appears hemodynamically significant. Other posterior circulation atherosclerosis without stenosis.   Mr Jodene Nam Head/brain Wo Cm  2/25/2015EXAM: MRI HEAD WITHOUT CONTRAST MRA HEAD WITHOUT CONTRAST IMPRESSION: 1. Small acute/subacute infarct left corona radiata. No mass effect or hemorrhage. 2. Three superimposed punctate acute lacunar infarcts (right caudate nucleus, left occipital pole, left left parietal lobe). These could reflect synchronous small vessel ischemia rather than a recent embolic event. 3. Underlying chronic small vessel ischemia. 4. Intracranial atherosclerosis. Fusiform aneurysmal enlargement of the left ICA (cavernous segment) and right ICA terminus. No anterior circulation major branch occlusion. 5. Atherosclerosis in stenosis of the distal right vertebral artery which appears hemodynamically significant. Other posterior circulation atherosclerosis without stenosis.   EKG Atrial fibrillation Right bundle branch block ST depr, consider ischemia, anterolateral lds Baseline wander in lead(s) II III aVR aVL aVF V3  07/10/14 venous Doppler RLE negative for DVT at major veins of the right lower extremity.   Assessment/Plan Pruritic condition Itching from left nipple to pacemaker w/o rash--apply TAC nightly.   Open wound of knee, leg (except thigh), and ankle, complicated Healed.   Constipation Stable since 04/15/14 MiraLax prn    Foot spasms Mainly in his left foot. 09/17/13 Mg 2.4 and Ca 9.5 07/08/14 Mg 2/1 08/14/14 dc prn Zanaflex      Acute ischemic left MCA stroke Takes Eliquis bid.   Adjustment disorder with mixed anxiety and depressed mood 04/23/13 Pharm prn Ambien not used, reduce Lorazepam 0.34m qhs.     Osteoarthritis Back pain is subsided, but overall he has joints aches and pain at times. Prn Tylenol and Tramadol available to him.      Enlarged prostate with lower urinary tract symptoms (LUTS) Worsened urinary incontinence-denied dysuria or lower abd/CVA discomfort. Will f/u Urology.    Gout No flare ups. Takes Allopurinol 568mdaily.    CKD stage 2 due to type 2 diabetes mellitus 07/29/14 Bun/creat 2609/4.07Chronic systolic congestive heart failure 07/29/14 BNP 256.7 Clinically compensated, continue Furosemide.   Edema Subsided since Furosemide resumed. Healed open area at [facility]    Atrial fibrillation Rate controlled, takes Metoprolol 7537mid. Eliquis for VTE risk reduction.     HTN (hypertension) Metoprolol 75 mg twice a day  and Losartan 158m daily controlled    Hypothyroidism takes Levothyroxine 751m, TSH 4.551 06/27/14-scheduled TSH in 3 months.       Family/ Staff Communication: observe the patient.   Goals of Care: AL  Labs/tests ordered: none

## 2014-08-21 NOTE — Assessment & Plan Note (Signed)
04/23/13 Pharm prn Ambien not used, reduce Lorazepam 0.25mg  qhs.

## 2014-08-21 NOTE — Assessment & Plan Note (Signed)
Itching from left nipple to pacemaker w/o rash--apply TAC nightly.

## 2014-08-21 NOTE — Assessment & Plan Note (Signed)
07/29/14 Bun/creat 26/0.91

## 2014-08-21 NOTE — Assessment & Plan Note (Signed)
takes Levothyroxine 14mcg, TSH 4.551 06/27/14-scheduled TSH in 3 months.

## 2014-08-21 NOTE — Assessment & Plan Note (Signed)
Mainly in his left foot. 09/17/13 Mg 2.4 and Ca 9.5 07/08/14 Mg 2/1 08/14/14 dc prn Zanaflex

## 2014-08-21 NOTE — Assessment & Plan Note (Signed)
Metoprolol 75 mg twice a day and Losartan 100mg  daily controlled

## 2014-08-21 NOTE — Assessment & Plan Note (Signed)
No flare ups. Takes Allopurinol 50mg  daily.

## 2014-08-21 NOTE — Assessment & Plan Note (Signed)
Subsided since Furosemide resumed. Healed open area at [facility]

## 2014-08-21 NOTE — Assessment & Plan Note (Signed)
Takes Eliquis bid.

## 2014-08-21 NOTE — Assessment & Plan Note (Signed)
07/29/14 BNP 256.7 Clinically compensated, continue Furosemide.

## 2014-08-21 NOTE — Assessment & Plan Note (Signed)
Worsened urinary incontinence-denied dysuria or lower abd/CVA discomfort. Will f/u Urology.

## 2014-08-21 NOTE — Assessment & Plan Note (Signed)
Back pain is subsided, but overall he has joints aches and pain at times. Prn Tylenol and Tramadol available to him.

## 2014-08-21 NOTE — Assessment & Plan Note (Signed)
Stable since 04/15/14 MiraLax prn

## 2014-09-03 ENCOUNTER — Encounter: Payer: Self-pay | Admitting: Internal Medicine

## 2014-09-03 ENCOUNTER — Ambulatory Visit (INDEPENDENT_AMBULATORY_CARE_PROVIDER_SITE_OTHER): Payer: Medicare Other | Admitting: Internal Medicine

## 2014-09-03 VITALS — BP 116/60 | HR 71 | Ht 68.0 in | Wt 155.0 lb

## 2014-09-03 DIAGNOSIS — I63512 Cerebral infarction due to unspecified occlusion or stenosis of left middle cerebral artery: Secondary | ICD-10-CM | POA: Diagnosis not present

## 2014-09-03 DIAGNOSIS — I509 Heart failure, unspecified: Secondary | ICD-10-CM | POA: Diagnosis not present

## 2014-09-03 DIAGNOSIS — I4510 Unspecified right bundle-branch block: Secondary | ICD-10-CM | POA: Diagnosis not present

## 2014-09-03 DIAGNOSIS — I4819 Other persistent atrial fibrillation: Secondary | ICD-10-CM

## 2014-09-03 DIAGNOSIS — I50810 Right heart failure, unspecified: Secondary | ICD-10-CM

## 2014-09-03 DIAGNOSIS — I481 Persistent atrial fibrillation: Secondary | ICD-10-CM | POA: Diagnosis not present

## 2014-09-03 DIAGNOSIS — I255 Ischemic cardiomyopathy: Secondary | ICD-10-CM | POA: Diagnosis not present

## 2014-09-03 NOTE — Progress Notes (Signed)
OFFICE NOTE  Chief Complaint:  Follow-up  Primary Care Physician: [name], MD  HPI:  Jon [name]  is a 81 yo male formerly followed by Dr. Rex Kras and recently seen by Cecilie Kicks, NP, for left lower extremity edema with discoloration of his toes, was beginning to be uncomfortable for him to walk. We did venous Dopplers. He was quite concerned about arterial blood supply. His mother ended up with bilateral amputations. Venous Dopplers showed a ruptured baker cyst, no DVT. He was instructed on this and since that time his symptoms have resolved completely. He is on Coumadin for paroxysmal A-fib which led to the discoloration in his toes. Additionally, he has a history of abdominal aortic aneurysm and because he was concerned about his arterial disease, if he had it, we did Dopplers.  He is here for the results of the Dopplers. Bilateral ABIs were 1.0, though he does have appearance of an occluded right anterior tibial artery and a left posterior tib demonstrated a short segment of occlusive disease with reconstitution at the ankle. His pulses have been 2+. He has no claudication symptoms. Additionally, the duplex of his abdominal aorta was slightly, minimally changed from his last one. Previously it was 3.95 x 3.57, now he is 3.7 x 4.0, essentially the same, very slight increase. He has no abdominal pain and no other complaints. He has really no complaints today, feels quite well. He is not aware of any tachycardias or palpitations. He has a Medtronic pacemaker which was interrogated today. This demonstrates a battery voltage of 3.0V.  His a-fib burden is 1.6% and he is on amiodarone.  Other history includes bypass grafting in 1989; vein graft was stented in 2008. Last stress test was 2012, low risk study. EF was 49%. Pacemaker was placed for paroxysmal A-fib and bradycardia, sick sinus syndrome in 2011. He is also on warfarin.   [name] underwent PFTs on 10/22/2012. This showed a  moderately severe restrictive limitation as well as moderately reduced diffusion capacity. Based on these findings I recommended discontinuing his amiodarone and he also stopped his pravastatin. Since that time he's noted that he feels quite a bit better including some minor improvement in his knee pain. He still feels like his left knee is bothering him and he is status post right TKR. 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 I did refer him to the [facility], but has not been contacted for appointment so far.  Since his last followup, [name] was seen by Dr. Sallyanne Kuster for a device check in his device appeared to be working properly. He was having atrial fibrillation, but a little burden. He continued on warfarin which was generally therapeutic. Unfortunately, in December he had a thalamic stroke which resulted in some right facial droop and hemiparesis. That is improving , but slowly. He was then changed to Eliquis for anticoagulation. He was also started on Zetia, as he has a history of statin intolerance in the past.  An echo performed in the hospital demonstrated an EF of 40-45%, which is mildly reduced from previous studies. In addition, he has had a small amount of weight gain and lower extremity swelling which is noted over the past several weeks while in recovery.  Unfortunately, he suffered another stroke last month around the time that we plan to admit him for tikosyn induction. He is still trying to recover from that.   [name] appears euvolemic today.  He does have a small amount of lower extremity swelling. His weight has been stable. He is reporting some epistaxis. He is concerned about the dose of his Eliquis. He is also reporting some urinary incontinence which is new. There is some flank pain and there is some possible concern for urinary tract infection however he is awaiting the results of a urinalysis.  [name] was seen in the office today as an add-on for  worsening swelling and shortness of breath. I reviewed notes from [facility] which indicate in January because of excessive urination he requested to come off of his diuretics. He was taken off the diuretics but quickly developed swelling and worsening shortness of breath. A BNP was obtained which was greater than 300. He was restarted on his diuretics and had some improvement in his shortness of breath but still has persistent symptoms. He's also developed asymmetric right lower extremity edema. He underwent an in office Doppler ultrasound that was negative for DVT. Those results are communicated in the office notes, but no formal radiology report is available. It would be unlikely for him to have DVT given the fact that he's on Eliquis. He still has persistent right lower extremity edema. He reports shortness of breath, fatigue and difficulty speaking. He denies any chest pain.  He is also overdue for pacemaker check and that was performed in the office today under my supervision.  [name] returns today in the office. He reports an improvement in his shortness of breath with an increased dose of Lasix. An echocardiogram was performed which shows preserved systolic function of the left ventricle however there is significant right heart dysfunction and severe dilatation of the right ventricle. This is likely the cause of his shortness of breath. He seems to respond and diaphoretic. He reports his fatigue is improving and is currently working with physical therapy.  PMHx:  Past Medical History  Diagnosis Date  . Hypertension   . Coronary artery disease   . Arthritis   . Cataract   . Anxiety   . Blood transfusion without reported diagnosis   . Hyperlipidemia   . Heart murmur   . Allergy   . Thyroid disease   . PAF (paroxysmal atrial fibrillation)     coumadin  . History of abdominal aortic aneurysm   . History of echocardiogram 12/2008    EF >55%; mild concentric LVH; mild MR; mild-mod  TR; mild AV regurg;   . History of nuclear stress test 12/2010    lexiscan; low risk; compared to prior study, perfusion improved  . History of Doppler ultrasound 05/22/2012    LEAs; R anterior tibial artery appeared occluded; L posterior tibial shows short segment of occlusive ds  . History of Doppler ultrasound 05/22/2012    Abdominal Aortic Doppler; slight increase in fusiform aneurysm   . Stroke   . Adjustment disorder with mixed anxiety and depressed mood 04/18/2013    Post CVA depression and anxiety   . Abnormality of gait 04/18/2013  . 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 Increased exposure of the right cornea.   . Pruritic condition 01/24/2013  . Memory change 01/24/2013  . Abnormal PFTs 10/30/2012    Followed in Pulmonary clinic/ [facility]/ [place]  - PFT's 10/22/12 VC  55% no obst, DLCO 50%  - PFT's 12/20/2012 VC 83% and no obst, dLCO 55%  -11/08/2012  Walked RA x 3 laps @ 185 ft each stopped due to  End of  study, not desat -11/08/12 esr 10    . Impotence of organic origin 10/03/2000  . Osteoarthrosis, unspecified whether generalized or localized, unspecified site 02/05/2003  . Hypertrophy of prostate with urinary obstruction and other lower urinary tract symptoms (LUTS) 04/28/2004  . Gout, unspecified 11/03/2004  . Acute on chronic systolic congestive heart failure, NYHA class 2 03/16/2006    BNP 376.9 05/28/13   . Atherosclerosis of renal artery 10/18/2006  . Unspecified vitamin D deficiency 10/18/2006  . Obstructive sleep apnea (adult) (pediatric) 07/23/2008  . Major depressive disorder, single episode, unspecified 03/05/2009  . Basal cell carcinoma of skin of other and unspecified parts of face 12/24/2009  . Internal hemorrhoids without mention of complication 20/35/5974  . Muscle weakness (generalized) 03/10/2011  . Pain in joint, lower leg 08/04/2011  . Nocturia 10/27/2011  . Aortic aneurysm of unspecified site without mention of rupture 10/27/2011  . Fall  03/04/2011  . Hyponatremia 03/04/2011  . TIA (transient ischemic attack) 04/30/2013  . Fatigue 04/18/2013  . 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 CVA - Rt brain stroke 12/14 02/18/2013    02/19/13 angiography CT head:  Diffuse atherosclerotic irregularity and plaque formation of the distal right common carotid artery and proximal internal carotid artery without significant stenosis. Plaque ulceration is present and is a source of emboli. A small right thalamic CVA    . Dyslipidemia 11/03/2004  . Type II or unspecified type diabetes mellitus without mention of complication, not stated as uncontrolled 11/24/2004  . Spinal stenosis in cervical region 12/15/2004  . Right bundle branch block 04/19/2006  . Cardiac pacemaker in situ- MDT 10/11 03/18/2010    Medtronic revo implant in October 2011. Severe sinus bradycardia in the 30s, but not truly pacemaker dependent   . Long term (current) use of anticoagulants 03/17/2011  . Hypothyroidism 03/04/2011  . HTN (hypertension) 03/04/2011  . Atrial fibrillation 03/04/2011     He was on coumadin until his stroke last December and was switched to apixaban 2.58m daily which he has been taking faithfully without reported side effects.    . SSS (sick sinus syndrome) 04/08/2014  . DM (diabetes mellitus), type 2 with renal complications 41/63/8453 . CKD stage 2 due to type 2 diabetes mellitus 07/03/2014  . Osteoarthritis of both knees 07/31/2014  . Quadriceps weakness 07/31/2014  . Balance disorder 07/31/2014  . Open wound of knee, leg (except thigh), and ankle, complicated 56/46/8032   Past Surgical History  Procedure Laterality Date  . Coronary artery bypass graft  1989  . Pacemaker insertion  2011  . Insert / replace / remove pacemaker    . Eye surgery    . Joint replacement    . Hernia repair    . Coronary angioplasty with stent placement  2008    stent to SVG to OM    FAMHx:  Family History  Problem  Relation Age of Onset  . Diabetes Father   . Other Mother     PAD with amputations    SOCHx:   reports that he quit smoking about 47 years ago. His smoking use included Cigarettes. He has a 11.25 pack-year smoking history. He has never used smokeless tobacco. He reports that he does not drink alcohol or use illicit drugs.  ALLERGIES:  Allergies  Allergen Reactions  . Caduet [Amlodipine-Atorvastatin] Other (See Comments)    Weakness   . Crestor [Rosuvastatin] Other (See Comments)  Muscle pain/weakness  . Cymbalta [Duloxetine Hcl] Other (See Comments)    Excessive sedation  . Lipitor [Atorvastatin Calcium] Other (See Comments)    Muscle pain/weakness  . Lisinopril Other (See Comments)    Doesn't remember reaction  . Quinolones     Risk of rupture of AAA  . Simvastatin Other (See Comments)    Muscle pain/weakness    ROS: A comprehensive review of systems was negative except for: Respiratory: positive for dyspnea on exertion Cardiovascular: positive for irregular heart beat and lower extremity edema  HOME MEDS: Current Outpatient Prescriptions  Medication Sig Dispense Refill  . acetaminophen (TYLENOL) 325 MG tablet Take 2 tablets (650 mg total) by mouth 2 (two) times daily. DO NOT EXCEED 4 TABLETS DAILY (Patient taking differently: Take 650 mg by mouth. DO NOT EXCEED 4 TABLETS DAILY take 2 tablets every 6 hours as needed for pain) 360 tablet 1  . allopurinol (ZYLOPRIM) 100 MG tablet Take 50 mg by mouth daily.     Marland Kitchen amoxicillin (AMOXIL) 500 MG capsule Take 500 mg by mouth. Take 4 caps prior to dential    . apixaban (ELIQUIS) 2.5 MG TABS tablet Take 1 tablet (2.5 mg total) by mouth 2 (two) times daily. 60 tablet 11  . Cholecalciferol (VITAMIN D) 400 UNITS capsule Take 400 Units by mouth 2 (two) times daily.     Marland Kitchen DOXYCYCLINE PO Take by mouth.    . furosemide (LASIX) 40 MG tablet Take 1 tablet (40 mg total) by mouth 2 (two) times daily. 180 tablet 3  . ipratropium (ATROVENT)  0.06 % nasal spray One puff in each nostril before each meal 15 mL 12  . levothyroxine (SYNTHROID, LEVOTHROID) 100 MCG tablet Take 100 mcg by mouth daily before breakfast.    . LORazepam (ATIVAN) 0.5 MG tablet Take one tablet by mouth at bedtime (Patient taking differently: Take one (0.25 mg) tablet by mouth at bedtime) 30 tablet 5  . losartan (COZAAR) 100 MG tablet Take 1 tablet (100 mg total) by mouth daily. 30 tablet 5  . metoprolol tartrate (LOPRESSOR) 25 MG tablet Take 3 tablets (75 mg total) by mouth 2 (two) times daily. 120 tablet 0  . nitroGLYCERIN (NITROSTAT) 0.4 MG SL tablet Place 0.4 mg under the tongue every 5 (five) minutes as needed for chest pain.     . Nutritional Supplements (BOOST COMPACT) LIQD Take by mouth. 237 mls daily    . olopatadine (PATANOL) 0.1 % ophthalmic solution One drop in each eye twice daily to help conjunctival irritation 5 mL 12  . polyethylene glycol (MIRALAX / GLYCOLAX) packet Take 17 g by mouth daily.    . polyvinyl alcohol (LIQUIFILM TEARS) 1.4 % ophthalmic solution Place 1 drop into both eyes. One drop in both eyes twice daily as needed for dry eyes    . tiZANidine (ZANAFLEX) 2 MG tablet Take 2 mg by mouth as needed for muscle spasms.    . traMADol (ULTRAM) 50 MG tablet Take 50 mg by mouth every 6 (six) hours as needed. for pain  0  . Triamcinolone Acetonide (KENALOG IN ORABASE MT) Use as directed 5 mg in the mouth or throat.     No current facility-administered medications for this visit.    LABS/IMAGING: No results found for this or any previous visit (from the past 48 hour(s)). No results found.  VITALS: BP 116/60 mmHg  Pulse 71  Ht _0  (1.727 m)  Wt 155 lb (70.308 kg)  BMI 23.57 kg/m2  EXAM:  Deferred  EKG: Deferred  ASSESSMENT: 1. Acute on chronic systolic, right sided- congestive heart failure, NYHA Class II symptoms - EF 55-60% 2. Persistent atrial fibrillation 3. Sick sinus syndrome status post pacemaker placement with normal  function 4. Abnormal PFT's - off of amiodarone 5. History of abdominal aortic aneurysm with mild enlargement 6. Coronary artery disease status post CABG in 1989 with stent to the vein graft in 2008 7. Minimal PAD with normal ABIs bilaterally 8. Recent thalamic stroke - now on Eliquis 9. Epistaxis 10. Urinary incontinence  PLAN: 1.   Mr. Sonneborn seems to have improved with increased dose Lasix. His BNP was only about 250. His echo shows normal systolic function of the left ventricle which is an improvement compared to prior echoes however there is significant right heart dysfunction which is likely the cause of his decompensation. We'll continue to manage this with diarrhetic's. He's had no bleeding problems with the Eliquis. His A. fib rate appears to be controlled. Overall he is doing fairly well, given his age and numerous medical problems.  Plan to see him back in 3 months.  Pixie Casino, MD, [facility] Attending Cardiologist Mahaffey C Deantae Shackleton 09/03/2014, 6:52 PM

## 2014-09-03 NOTE — Patient Instructions (Signed)
Your physician recommends that you schedule a follow-up appointment in: 3 months with Dr. Debara Pickett Please have labs prior to your next office visit

## 2014-09-05 DIAGNOSIS — I509 Heart failure, unspecified: Secondary | ICD-10-CM | POA: Diagnosis not present

## 2014-09-05 DIAGNOSIS — I10 Essential (primary) hypertension: Secondary | ICD-10-CM | POA: Diagnosis not present

## 2014-09-05 DIAGNOSIS — I4891 Unspecified atrial fibrillation: Secondary | ICD-10-CM | POA: Diagnosis not present

## 2014-09-05 DIAGNOSIS — I259 Chronic ischemic heart disease, unspecified: Secondary | ICD-10-CM | POA: Diagnosis not present

## 2014-09-05 DIAGNOSIS — M2550 Pain in unspecified joint: Secondary | ICD-10-CM | POA: Diagnosis not present

## 2014-09-05 DIAGNOSIS — M25562 Pain in left knee: Secondary | ICD-10-CM | POA: Diagnosis not present

## 2014-09-05 DIAGNOSIS — M6281 Muscle weakness (generalized): Secondary | ICD-10-CM | POA: Diagnosis not present

## 2014-09-05 DIAGNOSIS — D472 Monoclonal gammopathy: Secondary | ICD-10-CM | POA: Diagnosis not present

## 2014-09-05 DIAGNOSIS — E039 Hypothyroidism, unspecified: Secondary | ICD-10-CM | POA: Diagnosis not present

## 2014-09-09 DIAGNOSIS — E039 Hypothyroidism, unspecified: Secondary | ICD-10-CM | POA: Diagnosis not present

## 2014-09-09 DIAGNOSIS — M6281 Muscle weakness (generalized): Secondary | ICD-10-CM | POA: Diagnosis not present

## 2014-09-09 DIAGNOSIS — I10 Essential (primary) hypertension: Secondary | ICD-10-CM | POA: Diagnosis not present

## 2014-09-09 DIAGNOSIS — M2550 Pain in unspecified joint: Secondary | ICD-10-CM | POA: Diagnosis not present

## 2014-09-09 DIAGNOSIS — M25562 Pain in left knee: Secondary | ICD-10-CM | POA: Diagnosis not present

## 2014-09-09 DIAGNOSIS — D472 Monoclonal gammopathy: Secondary | ICD-10-CM | POA: Diagnosis not present

## 2014-09-11 ENCOUNTER — Encounter: Payer: Self-pay | Admitting: Internal Medicine

## 2014-09-11 ENCOUNTER — Non-Acute Institutional Stay: Payer: Medicare Other | Admitting: Internal Medicine

## 2014-09-11 VITALS — BP 124/76 | HR 64 | Temp 97.6°F | Wt 156.0 lb

## 2014-09-11 DIAGNOSIS — R29818 Other symptoms and signs involving the nervous system: Secondary | ICD-10-CM | POA: Diagnosis not present

## 2014-09-11 DIAGNOSIS — R0600 Dyspnea, unspecified: Secondary | ICD-10-CM

## 2014-09-11 DIAGNOSIS — I50810 Right heart failure, unspecified: Secondary | ICD-10-CM

## 2014-09-11 DIAGNOSIS — I5022 Chronic systolic (congestive) heart failure: Secondary | ICD-10-CM | POA: Diagnosis not present

## 2014-09-11 DIAGNOSIS — M6281 Muscle weakness (generalized): Secondary | ICD-10-CM

## 2014-09-11 DIAGNOSIS — I255 Ischemic cardiomyopathy: Secondary | ICD-10-CM

## 2014-09-11 DIAGNOSIS — R2689 Other abnormalities of gait and mobility: Secondary | ICD-10-CM

## 2014-09-11 DIAGNOSIS — R609 Edema, unspecified: Secondary | ICD-10-CM

## 2014-09-11 DIAGNOSIS — I509 Heart failure, unspecified: Secondary | ICD-10-CM

## 2014-09-11 DIAGNOSIS — R5382 Chronic fatigue, unspecified: Secondary | ICD-10-CM | POA: Diagnosis not present

## 2014-09-11 NOTE — Progress Notes (Signed)
Patient ID: Jon [name], male   DOB: 1921-07-26, 81 y.o.   MRN: [id number]    [facility] Room Number: 295  Place of Service: Clinic (12)     Allergies  Allergen Reactions  . Caduet [Amlodipine-Atorvastatin] Other (See Comments)    Weakness   . Crestor [Rosuvastatin] Other (See Comments)    Muscle pain/weakness  . Cymbalta [Duloxetine Hcl] Other (See Comments)    Excessive sedation  . Lipitor [Atorvastatin Calcium] Other (See Comments)    Muscle pain/weakness  . Lisinopril Other (See Comments)    Doesn't remember reaction  . Quinolones     Risk of rupture of AAA  . Simvastatin Other (See Comments)    Muscle pain/weakness    Chief Complaint  Patient presents with  . Medical Management of Chronic Issues    CHF, edema, weakness. Here with daughter Lesleigh Noe    HPI:  Dyspnea: Persistent  Right heart failure: Stable  Balance disorder: Generalized weakness and balance problems contribute to gait disorder. Balance problems may be well related to previous stroke.  Chronic fatigue no change from prior visits  Edema: 2+ and bilateral  Muscle weakness (generalized): Related to previous stroke and congestive heart failure  Chronic systolic congestive heart failure: Stable    Medications: Patient's Medications  New Prescriptions   No medications on file  Previous Medications   ACETAMINOPHEN (TYLENOL) 325 MG TABLET    Take 650 mg by mouth. Take one tablet every 6 hours as needed for pain   ALLOPURINOL (ZYLOPRIM) 100 MG TABLET    Take 50 mg by mouth daily.    AMOXICILLIN (AMOXIL) 500 MG CAPSULE    Take 500 mg by mouth. Take 4 caps prior to dential   APIXABAN (ELIQUIS) 2.5 MG TABS TABLET    Take 1 tablet (2.5 mg total) by mouth 2 (two) times daily.   CHOLECALCIFEROL (VITAMIN D) 400 UNITS CAPSULE    Take 400 Units by mouth 2 (two) times daily.    FUROSEMIDE (LASIX) 40 MG TABLET    Take 1 tablet (40 mg total) by mouth 2 (two) times daily.     IPRATROPIUM (ATROVENT) 0.06 % NASAL SPRAY    One puff in each nostril before each meal   LEVOTHYROXINE (SYNTHROID, LEVOTHROID) 100 MCG TABLET    Take 100 mcg by mouth daily before breakfast.   LORAZEPAM (ATIVAN) 0.5 MG TABLET    Take one tablet by mouth at bedtime   LOSARTAN (COZAAR) 100 MG TABLET    Take 1 tablet (100 mg total) by mouth daily.   METOPROLOL TARTRATE (LOPRESSOR) 25 MG TABLET    Take 3 tablets (75 mg total) by mouth 2 (two) times daily.   NITROGLYCERIN (NITROSTAT) 0.4 MG SL TABLET    Place 0.4 mg under the tongue every 5 (five) minutes as needed for chest pain.    NUTRITIONAL SUPPLEMENTS (BOOST COMPACT) LIQD    Take by mouth. 237 mls daily   OLOPATADINE (PATANOL) 0.1 % OPHTHALMIC SOLUTION    One drop in each eye twice daily to help conjunctival irritation   POLYETHYLENE GLYCOL (MIRALAX / GLYCOLAX) PACKET    Take 17 g by mouth daily.   POLYVINYL ALCOHOL (LIQUIFILM TEARS) 1.4 % OPHTHALMIC SOLUTION    Place 1 drop into both eyes. One drop in both eyes twice daily as needed for dry eyes   SILODOSIN (RAPAFLO) 8 MG CAPS CAPSULE    Take 8 mg by mouth. Take one daily when completed start Tamsulosin  TAMSULOSIN (FLOMAX) 0.4 MG CAPS CAPSULE    Take 0.4 mg by mouth. Take one 1/2 hour after same meal daily, start after Rapaflo completed   TIZANIDINE (ZANAFLEX) 2 MG TABLET    Take 2 mg by mouth as needed for muscle spasms.   TRAMADOL (ULTRAM) 50 MG TABLET    Take 50 mg by mouth every 6 (six) hours as needed. for pain  Modified Medications   No medications on file  Discontinued Medications   ACETAMINOPHEN (TYLENOL) 325 MG TABLET    Take 2 tablets (650 mg total) by mouth 2 (two) times daily. DO NOT EXCEED 4 TABLETS DAILY   DOXYCYCLINE PO    Take by mouth.   TRIAMCINOLONE ACETONIDE (KENALOG IN ORABASE MT)    Use as directed 5 mg in the mouth or throat.     Review of Systems  Constitutional: Positive for fatigue. Negative for fever.  HENT: Positive for hearing loss and voice change  (dysphonia since his stroke.). Negative for congestion, ear discharge, ear pain, rhinorrhea, sinus pressure and tinnitus.   Eyes: Negative.   Respiratory: Negative for cough, choking, chest tightness, shortness of breath and wheezing.   Cardiovascular: Negative for palpitations and leg swelling (sometimes, not apparent today. ).  Gastrointestinal: Negative for abdominal pain, diarrhea and abdominal distention.       Chokes on swallowing since his CVA. Episodes of fecal incontinence. Hx of hemorrhoids.  Endocrine:       Hypothyroid. Mild elevations in blood sugars, although not on every check. Not using medications to treat diabetes, but does avoid sweets in the diet.  Genitourinary: Positive for urgency and frequency.       Incontinent from time to time. Followed by Dr. Matilde Sprang.  Musculoskeletal: Positive for back pain, joint swelling (left knee), arthralgias and gait problem. Negative for neck pain and neck stiffness.  Skin: Negative for pallor and rash.  Allergic/Immunologic: Negative.   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 Recurrent CVA on 04/30/13 of the left MCA area  Hematological: Negative.   Psychiatric/Behavioral: Negative.     Filed Vitals:   09/11/14 1657  BP: 124/76  Pulse: 64  Temp: 97.6 F (36.4 C)  TempSrc: Oral  Weight: 156 lb (70.761 kg)  SpO2: 95%   Body mass index is 23.73 kg/(m^2).  Physical Exam  Constitutional: He is oriented to person, place, and time. He appears well-developed and well-nourished. No distress.  HENT:  Severe hearing loss. Bilateral aides. Right facial droop.   Eyes:  Corrective lenses. Increased exposure of the right cornea due to post CVA facial droop.  Neck: No JVD present. No tracheal deviation present. No thyromegaly present.  Cardiovascular: Normal rate and normal heart sounds.  Frequent extrasystoles are present.  No murmur heard. Pacemaker left upper chest    Pulmonary/Chest: Effort normal. He has no wheezes. He has rales.  dry rales posterior lung bases.   Abdominal: Soft. Bowel sounds are normal. He exhibits no distension and no mass. There is no tenderness.  Musculoskeletal: Normal range of motion. He exhibits edema. He exhibits no tenderness.  Unstable gait. Using 4 wheel walker and w/c. Weaker on the right side. Hx of the right total knee replacement.  1+ edema to right LE  Lymphadenopathy:    He has no cervical adenopathy.  Neurological: He is alert and oriented to person, place, and time. He displays normal reflexes. No cranial nerve deficit. He exhibits normal muscle tone.  Coordination normal.  Slurred speech. Right side weakness. C/o left foot spasm   Skin: Skin is warm and dry. No rash noted. No erythema. No pallor.  Psychiatric: He has a normal mood and affect. His behavior is normal. Judgment and thought content normal.  Nursing note and vitals reviewed.    Labs reviewed: Lab on 08/05/2014  Component Date Value Ref Range Status  . Glucose 07/29/2014 97   Final  . BUN 07/29/2014 26* 4 - 21 mg/dL Final  . Creatinine 07/29/2014 0.9  0.6 - 1.3 mg/dL Final  . Potassium 07/29/2014 4.2  3.4 - 5.3 mmol/L Final  . Sodium 07/29/2014 138  137 - 147 mmol/L Final  Clinical Support on 07/25/2014  Component Date Value Ref Range Status  . Date Time Interrogation Session 07/25/2014 [id number]   Preliminary  . Pulse Generator Manufacturer 07/25/2014 MERM   Preliminary  . Pulse Gen Model 07/25/2014 RVDR01 Revo MRI   Preliminary  . Pulse Gen Serial Number 07/25/2014 [id number]   Preliminary  . Implantable Pulse Generator Type 07/25/2014 Implantable Pulse Generator   Preliminary  . Implantable Pulse Generator Implan* 07/25/2014 [id number]   Preliminary  . Lead Channel Setting Sensing Sensi* 07/25/2014 0.9   Preliminary  . Lead Channel Setting Pacing Pulse * 07/25/2014 0.4   Preliminary  . Lead Channel Setting Pacing Amplit*  07/25/2014 2.5   Preliminary  . Zone Setting Type Category 07/25/2014 VF   Preliminary  . Zone Setting Type Category 07/25/2014 VT   Preliminary  . Zone Setting Type Category 07/25/2014 VENTRICULAR_TACHYCARDIA_1   Preliminary  . Zone Setting Type Category 07/25/2014 VENTRICULAR_TACHYCARDIA_2   Preliminary  . Zone Setting Detection Interval 07/25/2014 400   Preliminary  . Zone Setting Type Category 07/25/2014 ATRIAL_FIBRILLATION   Preliminary  . Zone Setting Type Category 07/25/2014 ATAF   Preliminary  . Zone Setting Detection Interval 07/25/2014 350   Preliminary  . Lead Channel Impedance Value 07/25/2014 432   Preliminary  . Lead Channel Impedance Value 07/25/2014 448   Preliminary  . Lead Channel Sensing Intrinsic Amp* 07/25/2014 9.818   Preliminary  . Battery Status 07/25/2014 OK   Preliminary  . Battery Voltage 07/25/2014 2.96   Preliminary  . Loletha Grayer Statistic RV Percent Paced 07/25/2014 41.43   Preliminary  . Eval Rhythm 07/25/2014 AF/VP   Preliminary  Nursing Home on 07/17/2014  Component Date Value Ref Range Status  . Glucose 07/15/2014 102   Final  . BUN 07/15/2014 24* 4 - 21 mg/dL Final  . Creatinine 07/15/2014 0.9  0.6 - 1.3 mg/dL Final  . Potassium 07/15/2014 4.0  3.4 - 5.3 mmol/L Final  . Sodium 07/15/2014 136* 137 - 147 mmol/L Final  Lab on 07/03/2014  Component Date Value Ref Range Status  . Glucose 06/27/2014 110   Final  . BUN 06/27/2014 26* 4 - 21 mg/dL Final  . Creatinine 06/27/2014 0.9  0.6 - 1.3 mg/dL Final  . Potassium 06/27/2014 4.5  3.4 - 5.3 mmol/L Final  . Sodium 06/27/2014 136* 137 - 147 mmol/L Final  . TSH 06/27/2014 4.55  0.41 - 5.90 uIU/mL Final     Assessment/Plan  1. Dyspnea Related to heart failure  2. Right heart failure Stable  3. Balance disorder Continue use caution when using walker and out of bed  4. Chronic fatigue Walk daily  5. Edema Continue current medicines  6. Muscle weakness (generalized) Continue to walk daily for  strengthening  7. Chronic systolic congestive heart failure Unchanged

## 2014-09-12 ENCOUNTER — Telehealth: Payer: Self-pay

## 2014-09-12 NOTE — Telephone Encounter (Signed)
Received fax from [facility], order written 09/11/14 for Cymbalta, it is listed as an allergy. They please clarify. Message to Dr. Nyoka Cowden

## 2014-09-15 NOTE — Telephone Encounter (Signed)
He is not allergic to Cymbalta. He complained that he became too drowsy after taking it so we stopped it. There were multiple other causes for him to become drowsy. I believe he should take this drug in the Cymbalta should be removed from his allergy list.

## 2014-09-16 DIAGNOSIS — I10 Essential (primary) hypertension: Secondary | ICD-10-CM | POA: Diagnosis not present

## 2014-09-16 LAB — BASIC METABOLIC PANEL
BUN: 22 mg/dL — AB (ref 4–21)
CREATININE: 0.9 mg/dL (ref 0.6–1.3)
GLUCOSE: 108 mg/dL
POTASSIUM: 4 mmol/L (ref 3.4–5.3)
Sodium: 136 mmol/L — AB (ref 137–147)

## 2014-09-16 LAB — CBC AND DIFFERENTIAL
HEMATOCRIT: 42 % (ref 41–53)
HEMOGLOBIN: 14.3 g/dL (ref 13.5–17.5)
PLATELETS: 223 10*3/uL (ref 150–399)
WBC: 5.7 10^3/mL

## 2014-09-16 LAB — HEPATIC FUNCTION PANEL
ALT: 12 U/L (ref 10–40)
AST: 17 U/L (ref 14–40)
Alkaline Phosphatase: 87 U/L (ref 25–125)
Bilirubin, Total: 1 mg/dL

## 2014-09-16 LAB — TSH: TSH: 2.96 u[IU]/mL (ref 0.41–5.90)

## 2014-09-16 NOTE — Telephone Encounter (Signed)
Wrote Dr. Rolly Salter message on fax, and fax back to [facility]

## 2014-09-17 DIAGNOSIS — M6281 Muscle weakness (generalized): Secondary | ICD-10-CM | POA: Diagnosis not present

## 2014-09-17 DIAGNOSIS — M25562 Pain in left knee: Secondary | ICD-10-CM | POA: Diagnosis not present

## 2014-09-17 DIAGNOSIS — I10 Essential (primary) hypertension: Secondary | ICD-10-CM | POA: Diagnosis not present

## 2014-09-17 DIAGNOSIS — M2550 Pain in unspecified joint: Secondary | ICD-10-CM | POA: Diagnosis not present

## 2014-09-17 DIAGNOSIS — E039 Hypothyroidism, unspecified: Secondary | ICD-10-CM | POA: Diagnosis not present

## 2014-09-17 DIAGNOSIS — D472 Monoclonal gammopathy: Secondary | ICD-10-CM | POA: Diagnosis not present

## 2014-09-18 ENCOUNTER — Other Ambulatory Visit: Payer: Self-pay | Admitting: Nurse Practitioner

## 2014-09-18 DIAGNOSIS — N182 Chronic kidney disease, stage 2 (mild): Principal | ICD-10-CM

## 2014-09-18 DIAGNOSIS — E1122 Type 2 diabetes mellitus with diabetic chronic kidney disease: Secondary | ICD-10-CM

## 2014-09-18 DIAGNOSIS — E039 Hypothyroidism, unspecified: Secondary | ICD-10-CM

## 2014-09-30 DIAGNOSIS — I10 Essential (primary) hypertension: Secondary | ICD-10-CM | POA: Diagnosis not present

## 2014-09-30 DIAGNOSIS — I5021 Acute systolic (congestive) heart failure: Secondary | ICD-10-CM | POA: Diagnosis not present

## 2014-09-30 DIAGNOSIS — E039 Hypothyroidism, unspecified: Secondary | ICD-10-CM | POA: Diagnosis not present

## 2014-10-01 DIAGNOSIS — M6281 Muscle weakness (generalized): Secondary | ICD-10-CM | POA: Diagnosis not present

## 2014-10-01 DIAGNOSIS — D472 Monoclonal gammopathy: Secondary | ICD-10-CM | POA: Diagnosis not present

## 2014-10-01 DIAGNOSIS — I10 Essential (primary) hypertension: Secondary | ICD-10-CM | POA: Diagnosis not present

## 2014-10-01 DIAGNOSIS — M25562 Pain in left knee: Secondary | ICD-10-CM | POA: Diagnosis not present

## 2014-10-01 DIAGNOSIS — E039 Hypothyroidism, unspecified: Secondary | ICD-10-CM | POA: Diagnosis not present

## 2014-10-01 DIAGNOSIS — M2550 Pain in unspecified joint: Secondary | ICD-10-CM | POA: Diagnosis not present

## 2014-10-06 DIAGNOSIS — M1611 Unilateral primary osteoarthritis, right hip: Secondary | ICD-10-CM | POA: Diagnosis not present

## 2014-10-06 DIAGNOSIS — Z96651 Presence of right artificial knee joint: Secondary | ICD-10-CM | POA: Diagnosis not present

## 2014-10-06 DIAGNOSIS — Z471 Aftercare following joint replacement surgery: Secondary | ICD-10-CM | POA: Diagnosis not present

## 2014-10-06 DIAGNOSIS — M1712 Unilateral primary osteoarthritis, left knee: Secondary | ICD-10-CM | POA: Diagnosis not present

## 2014-10-07 DIAGNOSIS — I4891 Unspecified atrial fibrillation: Secondary | ICD-10-CM | POA: Diagnosis not present

## 2014-10-07 DIAGNOSIS — E039 Hypothyroidism, unspecified: Secondary | ICD-10-CM | POA: Diagnosis not present

## 2014-10-07 DIAGNOSIS — M25562 Pain in left knee: Secondary | ICD-10-CM | POA: Diagnosis not present

## 2014-10-07 DIAGNOSIS — R29898 Other symptoms and signs involving the musculoskeletal system: Secondary | ICD-10-CM | POA: Diagnosis not present

## 2014-10-07 DIAGNOSIS — M2550 Pain in unspecified joint: Secondary | ICD-10-CM | POA: Diagnosis not present

## 2014-10-07 DIAGNOSIS — R29818 Other symptoms and signs involving the nervous system: Secondary | ICD-10-CM | POA: Diagnosis not present

## 2014-10-07 DIAGNOSIS — I509 Heart failure, unspecified: Secondary | ICD-10-CM | POA: Diagnosis not present

## 2014-10-07 DIAGNOSIS — D472 Monoclonal gammopathy: Secondary | ICD-10-CM | POA: Diagnosis not present

## 2014-10-07 DIAGNOSIS — I69954 Hemiplegia and hemiparesis following unspecified cerebrovascular disease affecting left non-dominant side: Secondary | ICD-10-CM | POA: Diagnosis not present

## 2014-10-08 DIAGNOSIS — D472 Monoclonal gammopathy: Secondary | ICD-10-CM | POA: Diagnosis not present

## 2014-10-08 DIAGNOSIS — M2550 Pain in unspecified joint: Secondary | ICD-10-CM | POA: Diagnosis not present

## 2014-10-08 DIAGNOSIS — M25562 Pain in left knee: Secondary | ICD-10-CM | POA: Diagnosis not present

## 2014-10-08 DIAGNOSIS — I69954 Hemiplegia and hemiparesis following unspecified cerebrovascular disease affecting left non-dominant side: Secondary | ICD-10-CM | POA: Diagnosis not present

## 2014-10-08 DIAGNOSIS — R29818 Other symptoms and signs involving the nervous system: Secondary | ICD-10-CM | POA: Diagnosis not present

## 2014-10-08 DIAGNOSIS — R29898 Other symptoms and signs involving the musculoskeletal system: Secondary | ICD-10-CM | POA: Diagnosis not present

## 2014-10-13 ENCOUNTER — Ambulatory Visit (INDEPENDENT_AMBULATORY_CARE_PROVIDER_SITE_OTHER): Payer: Medicare Other | Admitting: Neurology

## 2014-10-13 ENCOUNTER — Encounter: Payer: Self-pay | Admitting: Neurology

## 2014-10-13 VITALS — BP 99/57 | HR 68 | Ht 68.0 in | Wt 156.6 lb

## 2014-10-13 DIAGNOSIS — Z950 Presence of cardiac pacemaker: Secondary | ICD-10-CM | POA: Diagnosis not present

## 2014-10-13 DIAGNOSIS — I482 Chronic atrial fibrillation, unspecified: Secondary | ICD-10-CM

## 2014-10-13 DIAGNOSIS — I255 Ischemic cardiomyopathy: Secondary | ICD-10-CM

## 2014-10-13 DIAGNOSIS — I6340 Cerebral infarction due to embolism of unspecified cerebral artery: Secondary | ICD-10-CM | POA: Diagnosis not present

## 2014-10-13 NOTE — Progress Notes (Signed)
STROKE NEUROLOGY FOLLOW UP NOTE  NAME: Jon [name] DOB: 10-Feb-1922  REASON FOR VISIT: stroke follow up HISTORY FROM: pt and daughter and chart  Today we had the pleasure of seeing Jon [name] in follow-up at our Neurology Clinic. Pt was accompanied by daughter.   History Summary Jon [name] is a 81 y.o. right handed white male with a PMH significant for HTN, HLD, CAD s/p CABG, paroxysmal afib s/p pacemaker, s/p right CEA, recurrent ischemic strokes and AAA followed up in clinic for stroke. He had stroke in Dec 2014 with dysarthria and right face droop. Prior to the stroke, pt was on coumadin, and INR was 2.02 at that time. Nonetheless, he was switched to eliquis 2.5mg  bid for stroke prevention.   In 04/2013, pt was again admitted due to acute onset RLE weakness. He said that he lost control of that leg for approximately 30 minutes. Denies weakness of the right arm, HA, vertigo, double vision, difficulty swallowing, confusion, or visual disturbances. He talked to his daughters and was advised to come to the ED. He stated that has been taking faithfully without reported side effects. CT brain upon arrival to the ED showed findings consistent with an area of subacute to chronic lacunar infarction left MCA distribution and a subsequent MRI-DWI demonstrated: " small acute/subacute infarct left corona radiata. No mass effect or hemorrhage. 2. Three superimposed punctate acute lacunar infarcts (right caudate  nucleus, left occipital pole, left left parietal lobe). These could reflect synchronous small vessel ischemia rather than a recent embolic event". MRA brain showed intracranial atherosclerosis and fusiform enlargement of the cavernous left ICA up to 9 mm diameter as well as fusiform enlargement of the right ICA terminus, up to 6 mm diameter. Total cholesterol 117, triglycerides 79, HDL 30, LDL 71. Eliquis was increased to 5 mg twice daily. Metoprolol was increased from 50 mg twice a  day to 75 mg twice a day for better blood pressure control. Patient had resolution of his symptoms. He was discharged back to ALF.  10/01/13 follow up - the patient has been doing well. He stated that he did not have another episode of weakness, numbness, however, he still has some dysarthria and slurry speech. He continued to take eliquis 5mg  bid and no missing doses. He also complained that he is on lasix and had urinary and bowel urgency. If he did not go to bathroom quick, he will have accident.   01/06/14 follow up - he was doing the same. However, he had epistaxis in 10/2013 and as per daughter it was bad bleeding, "horrible". He went to see cardiologist Dr. Debara Pickett and was felt risk more than benefit for him to be on eliquis 5mg  bid and his eliquis was decreased to 2.5mg  bid. He was also seen recently by Man Mast, NP for his UTI with E. Coli in culture. Pt stated that he had one day of hematuria (flank blood) during the UTI. He also complained of urinary urgency after taking lasix 40mg  in the morning. Daughter requested if the lasix can be divided to 20mg  bid.  04/14/14 follow up - he was doing well, no recurrent strokelike symptoms. So far tolerating Eliquis 2.5 mg twice today. He stated that he still has some intermittent mild nosebleed, but not bad at all. His blood pressure in clinic today 112/68, he is Currently on Lasix, losartan, metoprolol for blood pressure. He lives in assisted living facility, his blood pressure was not regularly checked. Daughter reports patient lack  of energy, but patient denies any depression or depressed mood. He recently had lower back pain, which caused him walking slow.  Interval History During the interval time,  Pt has been doing the same. He still lives in nursing home, walk with walker. He had a fall several weeks ago due to imbalance with quick turns. Did not get any injury. He supposed to take lasix twice a day for CHF, however, he has frequent urination after lasix,  so he only takes once ad day now. He will see urologist this Friday. On flomax now. He will also see PCP in two weeks. His BP today 99/57. Did not check BP daily in NH.   REVIEW OF SYSTEMS: Full 14 system review of systems performed and notable only for those listed below and in HPI above, all others are negative:  Constitutional: fatigue  Cardiovascular:   Ear/Nose/Throat: hearing loss, running nose  Skin: itching  Eyes: eye itching, eye redness, eye discharges  Respiratory:   Gastroitestinal: urinary urgency and frequent  Hematology/Lymphatic:   Endocrine: e  Musculoskeletal: walking difficulty, back pain  Allergy/Immunology: Env allergies  Neurological: slurry speech, facial drooping, weakness Psychiatric:  Sleep: daytime sleepiness  The following represents the patient's updated allergies and side effects list: Allergies  Allergen Reactions  . Caduet [Amlodipine-Atorvastatin] Other (See Comments)    Weakness   . Crestor [Rosuvastatin] Other (See Comments)    Muscle pain/weakness  . Lipitor [Atorvastatin Calcium] Other (See Comments)    Muscle pain/weakness  . Lisinopril Other (See Comments)    Doesn't remember reaction  . Quinolones     Risk of rupture of AAA  . Simvastatin Other (See Comments)    Muscle pain/weakness    Labs since last visit of relevance include the following: Results for orders placed or performed in visit on 09/18/14  CBC and differential  Result Value Ref Range   Hemoglobin 14.3 13.5 - 17.5 g/dL   HCT 42 41 - 53 %   Platelets 223 150 - 399 K/L   WBC 5.7 16^1/WR  Basic metabolic panel  Result Value Ref Range   Glucose 108 mg/dL   BUN 22 (A) 4 - 21 mg/dL   Creatinine 0.9 0.6 - 1.3 mg/dL   Potassium 4.0 3.4 - 5.3 mmol/L   Sodium 136 (A) 137 - 147 mmol/L  Hepatic function panel  Result Value Ref Range   Alkaline Phosphatase 87 25 - 125 U/L   ALT 12 10 - 40 U/L   AST 17 14 - 40 U/L   Bilirubin, Total 1.0 mg/dL  TSH  Result Value Ref Range    TSH 2.96 0.41 - 5.90 uIU/mL    The neurologically relevant items on the patient's problem list were reviewed on today's visit.  Neurologic Examination  A problem focused neurological exam (12 or more points of the single system neurologic examination, vital signs counts as 1 point, cranial nerves count for 8 points) was performed.  Blood pressure 99/57, pulse 68, height 5\' 8"  (1.727 m), weight 156 lb 9.6 oz (71.033 kg).  General - Well nourished, well developed, in no apparent distress.  Ophthalmologic - not able to see throught. Right eye conjunctival injection.  Cardiovascular - irregularly irregular heart rate and rhythm.  Mental Status -  Level of arousal and orientation to time, place, and person were intact. Language including expression, naming, repetition, comprehension, reading was assessed and found intact. Mild dysarthria.  Cranial Nerves II - XII - II - Vision intact OU. III, IV,  VI - Extraocular movements intact. V - Facial sensation intact bilaterally. VII - Facial movement intact bilaterally. VIII - Hearing & vestibular intact bilaterally. X - Palate elevates symmetrically, mild dysarthria. XI - Chin turning & shoulder shrug intact bilaterally. XII - Tongue protrusion intact.  Motor Strength - The patient's strength was 4+/5 in all extremities and pronator drift was absent.  Bulk was normal and fasciculations were absent.   Motor Tone - Muscle tone was assessed at the neck and appendages and was normal.  Reflexes - The patient's reflexes were 1+ in all extremities and he had no pathological reflexes. No rigidity.  Sensory - Light touch, temperature/pinprick were assessed and were normal.    Coordination - The patient had normal movements in the hands with no ataxia or dysmetria.  Tremor was absent.  Gait and Station - walk with walker due to lower back pain, slow but no hemiparetic gait.  Data reviewed: I personally reviewed the images and agree with the  radiology interpretations.  MRI and MRA 05/01/13 1. Small acute/subacute infarct left corona radiata. No mass effect or hemorrhage. 2. Three superimposed punctate acute lacunar infarcts (right caudate nucleus, left occipital pole, left left parietal lobe). These could reflect synchronous small vessel ischemia rather than a recent embolic event. 3. Underlying chronic small vessel ischemia. 4. Intracranial atherosclerosis. Fusiform aneurysmal enlargement of the left ICA (cavernous segment) and right ICA terminus. No anterior circulation major branch occlusion. 5. Atherosclerosis in stenosis of the distal right vertebral artery which appears hemodynamically significant. Other posterior circulation atherosclerosis without stenosis. CT 04/2013 Findings consistent with an area of subacute to chronic lacunar infarction left MCA distribution. Involutional and chronic changes also appreciated. There is no evidence of acute abnormalities. Diffuse atherosclerotic disease  CTA head 02/2013 Moderate stenosis of the right cavernous carotid with heavily calcified plaque. There is fusiform dilatation of the right supra clinoid internal carotid artery compatible with atherosclerotic disease. Mild to moderate atherosclerotic stenosis of the left cavernous carotid.  Calcified plaque with mild to moderate stenosis distal vertebral artery bilaterally Diffuse atherosclerotic irregularity and plaque formation of the distal right common carotid artery and proximal internal carotid artery without significant stenosis. Plaque ulceration is present and is a source of emboli. Atherosclerotic disease in the left carotid bifurcation without hemodynamically significant stenosis. 2D ehco 02/2013 - Left ventricle: The cavity size was normal. Wall thickness   was increased in a pattern of moderate LVH. There was   focal basal hypertrophy. Systolic function was mildly to   moderately reduced. The estimated ejection fraction was  in   the range of 40% to 45%. - Aortic valve: Mild regurgitation. Valve area: 2.29cm^2   (Vmax). - Left atrium: The atrium was moderately dilated. - Right ventricle: The cavity size was mildly dilated. - Right atrium: The atrium was mildly dilated. CUS 02/2013 - Findings consistent with1- 39 percent stenosis involving the right internal carotid artery and the left internal carotid artery. - The right vertebral demonstrates atypical waveforms.   Possible proximal obstruction.   The left vertebral flow is antegrade. - ICA/CCA Ratio. right = 0.33. left= 0.84  Component     Latest Ref Rng 05/01/2013 05/14/2013 05/16/2013 05/28/2013  Cholesterol     0 - 200 mg/dL 117     Triglycerides     <150 mg/dL 79     HDL     >39 mg/dL 30 (L)     Total CHOL/HDL Ratio      3.9     VLDL  0 - 40 mg/dL 16     LDL (calc)     0 - 99 mg/dL 71     Glucose         101  BUN     4 - 21 mg/dL    25 (A)  Creatinine     0.6 - 1.3 mg/dL    1.1  Potassium     3.4 - 5.3 mmol/L    4.0  Sodium     137 - 147 mmol/L    140  Hemoglobin A1C     4.0 - 6.0 % 6.3 (H)     Mean Plasma Glucose     <117 mg/dL 134 (H)     TSH     0.41 - 5.90 uIU/mL  3.68 3.15    Component     Latest Ref Rng 08/29/2013 09/17/2013  Cholesterol     0 - 200 mg/dL    Triglycerides     <150 mg/dL    HDL     >39 mg/dL    Total CHOL/HDL Ratio         VLDL     0 - 40 mg/dL    LDL (calc)     0 - 99 mg/dL    Glucose       110  BUN     4 - 21 mg/dL  20  Creatinine     0.6 - 1.3 mg/dL  1.0  Potassium     3.4 - 5.3 mmol/L  4.4  Sodium     137 - 147 mmol/L  138  Hemoglobin A1C     4.0 - 6.0 % 6.5 (A)   Mean Plasma Glucose     <117 mg/dL    TSH     0.41 - 5.90 uIU/mL 0.62    Component     Latest Ref Rng 12/31/2013  Glucose      111  BUN     4 - 21 mg/dL 19  Creatinine     0.6 - 1.3 mg/dL 1.0  Potassium     3.4 - 5.3 mmol/L 4.4  Sodium     137 - 147 mmol/L 136 (A)  TSH     0.41 - 5.90 uIU/mL 2.10   Assessment:  As you may recall, he is a 81 y.o. Caucasian male with PMH significant for HTN, HLD, CAD s/p CABG, paroxysmal afib s/p pacemaker, s/p right CEA, recurrent ischemic strokes and AAA followed up in clinic for stroke. He had first stroke in 02/2013, changed from coumadin to eliquis but on low dose. Had residue of slurry speech. In 04/2013 he had another stroke complaining of RLE weakness for 53min and resolved. MRI showed multiple punctate or small infarcts bilatearlly. He was put on eliquis 5mg  bid. However, he had epistaxis and hematuria during the interval time. Cardiologist Dr. Debara Pickett changed him back to 2.5mg  bid. So far he is tolerating 2.5 bid. He has side effect profile with Lipitor, Crestor, simvastatin. LDL last check 71, not on statin now.  Plan:  - continue eliquis 2.5mg  twice a day. - Check blood pressure at the facility daily and record and bring over to Dr. Nyoka Cowden next visit. - Follow up with your primary care physician for stroke risk factor modification. Recommend maintain blood pressure goal 120-140, diabetes with hemoglobin A1c goal below 6.5% and lipids with LDL cholesterol goal below 70 mg/dL.  - Follow-up with cardiology and urology. - recommend continued PT/OT for functional recovery - RTC in  one year.  Rosalin Hawking, MD PhD [facility] [address] [place], [place] [address] [phone]  No orders of the defined types were placed in this encounter.   No orders of the defined types were placed in this encounter.    Patient Instructions  - continue eliquis 2.5mg  twice a day. - Check blood pressure at the facility daily for at least two weeks and record and bring over to Dr. Nyoka Cowden next week. - Follow up with your primary care physician for stroke risk factor modification. Recommend maintain blood pressure goal 120-140, diabetes with hemoglobin A1c goal below 6.5% and lipids with LDL cholesterol goal below 70 mg/dL.  - Follow-up with Dr. Debara Pickett and  your urologist. - slow down and avoid fall.  - recommend continued PT/OT for functional recovery - follow up in one year.

## 2014-10-13 NOTE — Patient Instructions (Addendum)
-   continue eliquis 2.5mg  twice a day. - Check blood pressure at the facility daily for at least two weeks and record and bring over to Dr. Nyoka Cowden next week. - Follow up with your primary care physician for stroke risk factor modification. Recommend maintain blood pressure goal 120-140, diabetes with hemoglobin A1c goal below 6.5% and lipids with LDL cholesterol goal below 70 mg/dL.  - Follow-up with Dr. Debara Pickett and your urologist. - slow down and avoid fall.  - recommend continued PT/OT for functional recovery - follow up in one year.

## 2014-10-15 DIAGNOSIS — M2550 Pain in unspecified joint: Secondary | ICD-10-CM | POA: Diagnosis not present

## 2014-10-15 DIAGNOSIS — D472 Monoclonal gammopathy: Secondary | ICD-10-CM | POA: Diagnosis not present

## 2014-10-15 DIAGNOSIS — I69954 Hemiplegia and hemiparesis following unspecified cerebrovascular disease affecting left non-dominant side: Secondary | ICD-10-CM | POA: Diagnosis not present

## 2014-10-15 DIAGNOSIS — M25562 Pain in left knee: Secondary | ICD-10-CM | POA: Diagnosis not present

## 2014-10-15 DIAGNOSIS — R29818 Other symptoms and signs involving the nervous system: Secondary | ICD-10-CM | POA: Diagnosis not present

## 2014-10-15 DIAGNOSIS — R29898 Other symptoms and signs involving the musculoskeletal system: Secondary | ICD-10-CM | POA: Diagnosis not present

## 2014-10-16 DIAGNOSIS — H0012 Chalazion right lower eyelid: Secondary | ICD-10-CM | POA: Diagnosis not present

## 2014-10-17 DIAGNOSIS — R351 Nocturia: Secondary | ICD-10-CM | POA: Diagnosis not present

## 2014-10-17 DIAGNOSIS — N3941 Urge incontinence: Secondary | ICD-10-CM | POA: Diagnosis not present

## 2014-10-20 DIAGNOSIS — R29818 Other symptoms and signs involving the nervous system: Secondary | ICD-10-CM | POA: Diagnosis not present

## 2014-10-20 DIAGNOSIS — D472 Monoclonal gammopathy: Secondary | ICD-10-CM | POA: Diagnosis not present

## 2014-10-20 DIAGNOSIS — I69954 Hemiplegia and hemiparesis following unspecified cerebrovascular disease affecting left non-dominant side: Secondary | ICD-10-CM | POA: Diagnosis not present

## 2014-10-20 DIAGNOSIS — M2550 Pain in unspecified joint: Secondary | ICD-10-CM | POA: Diagnosis not present

## 2014-10-20 DIAGNOSIS — M25562 Pain in left knee: Secondary | ICD-10-CM | POA: Diagnosis not present

## 2014-10-20 DIAGNOSIS — R29898 Other symptoms and signs involving the musculoskeletal system: Secondary | ICD-10-CM | POA: Diagnosis not present

## 2014-10-23 ENCOUNTER — Non-Acute Institutional Stay (INDEPENDENT_AMBULATORY_CARE_PROVIDER_SITE_OTHER): Payer: Medicare Other | Admitting: Internal Medicine

## 2014-10-23 ENCOUNTER — Encounter: Payer: Self-pay | Admitting: Internal Medicine

## 2014-10-23 VITALS — BP 140/68 | HR 68 | Temp 97.8°F | Wt 156.0 lb

## 2014-10-23 DIAGNOSIS — R0600 Dyspnea, unspecified: Secondary | ICD-10-CM

## 2014-10-23 DIAGNOSIS — F329 Major depressive disorder, single episode, unspecified: Secondary | ICD-10-CM

## 2014-10-23 DIAGNOSIS — I69954 Hemiplegia and hemiparesis following unspecified cerebrovascular disease affecting left non-dominant side: Secondary | ICD-10-CM | POA: Diagnosis not present

## 2014-10-23 DIAGNOSIS — I10 Essential (primary) hypertension: Secondary | ICD-10-CM | POA: Diagnosis not present

## 2014-10-23 DIAGNOSIS — H0012 Chalazion right lower eyelid: Secondary | ICD-10-CM

## 2014-10-23 DIAGNOSIS — R609 Edema, unspecified: Secondary | ICD-10-CM

## 2014-10-23 DIAGNOSIS — IMO0002 Reserved for concepts with insufficient information to code with codable children: Secondary | ICD-10-CM

## 2014-10-23 DIAGNOSIS — R5382 Chronic fatigue, unspecified: Secondary | ICD-10-CM

## 2014-10-23 DIAGNOSIS — I5022 Chronic systolic (congestive) heart failure: Secondary | ICD-10-CM

## 2014-10-23 DIAGNOSIS — I255 Ischemic cardiomyopathy: Secondary | ICD-10-CM

## 2014-10-23 DIAGNOSIS — J3489 Other specified disorders of nose and nasal sinuses: Secondary | ICD-10-CM

## 2014-10-23 DIAGNOSIS — M25562 Pain in left knee: Secondary | ICD-10-CM

## 2014-10-23 DIAGNOSIS — R159 Full incontinence of feces: Secondary | ICD-10-CM

## 2014-10-23 DIAGNOSIS — R350 Frequency of micturition: Secondary | ICD-10-CM

## 2014-10-23 DIAGNOSIS — I63039 Cerebral infarction due to thrombosis of unspecified carotid artery: Secondary | ICD-10-CM | POA: Diagnosis not present

## 2014-10-23 DIAGNOSIS — D472 Monoclonal gammopathy: Secondary | ICD-10-CM | POA: Diagnosis not present

## 2014-10-23 DIAGNOSIS — E1122 Type 2 diabetes mellitus with diabetic chronic kidney disease: Secondary | ICD-10-CM | POA: Diagnosis not present

## 2014-10-23 DIAGNOSIS — L03115 Cellulitis of right lower limb: Secondary | ICD-10-CM

## 2014-10-23 DIAGNOSIS — I482 Chronic atrial fibrillation, unspecified: Secondary | ICD-10-CM

## 2014-10-23 DIAGNOSIS — N189 Chronic kidney disease, unspecified: Secondary | ICD-10-CM

## 2014-10-23 DIAGNOSIS — Z7901 Long term (current) use of anticoagulants: Secondary | ICD-10-CM

## 2014-10-23 DIAGNOSIS — M2550 Pain in unspecified joint: Secondary | ICD-10-CM | POA: Diagnosis not present

## 2014-10-23 DIAGNOSIS — R29818 Other symptoms and signs involving the nervous system: Secondary | ICD-10-CM | POA: Diagnosis not present

## 2014-10-23 DIAGNOSIS — R29898 Other symptoms and signs involving the musculoskeletal system: Secondary | ICD-10-CM | POA: Diagnosis not present

## 2014-10-23 DIAGNOSIS — I6350 Cerebral infarction due to unspecified occlusion or stenosis of unspecified cerebral artery: Secondary | ICD-10-CM

## 2014-10-23 NOTE — Progress Notes (Signed)
Patient ID: Jon [name], male   DOB: 1921/04/11, 81 y.o.   MRN: [id number]    [place] Room Number: [id number]  Place of Service: Clinic (12)     Allergies  Allergen Reactions  . Caduet [Amlodipine-Atorvastatin] Other (See Comments)    Weakness   . Crestor [Rosuvastatin] Other (See Comments)    Muscle pain/weakness  . Lipitor [Atorvastatin Calcium] Other (See Comments)    Muscle pain/weakness  . Lisinopril Other (See Comments)    Doesn't remember reaction  . Quinolones     Risk of rupture of AAA  . Simvastatin Other (See Comments)    Muscle pain/weakness    Chief Complaint  Patient presents with  . Medical Management of Chronic Issues    dyspnea, CHF, fatigue, edema    HPI:  Fecal incontinence: Patient says he has loose stools that are uncontrollable. This results in frequent fecal incontinence.  Rhinorrhea: Persistent problem with drippy nose. Worse at meal times.  Pain in joint, lower leg, left: Persistent problem with left knee pain. He has seen the orthopedist. He has received an injection in the knee on 10/06/2014.  Chalazion of right lower eyelid: Acute problem 10/15/2014 see by the ophthalmologist. He had a shoelace again in the right lower lid. Although there is still some mild irritation, it is getting better.  Cellulitis of leg, right: Resolved  Essential hypertension: Controlled  Dyspnea: Persistent problem with minimal exertion. Patient has known congestive heart failure which contributes to this problem.  Edema: Stable  Chronic atrial fibrillation: Anticoagulated and rate controlled.  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 Excessive fatigue. Slurred speech. He has been for follow-up appointment with the neurologist, Dr.Xu.  Type 2 diabetes mellitus with diabetic chronic kidney disease: Stable  Depression due to stroke: Continues to complain of a multitude of  problems. He just looks worn out. I think depression is contributing to some of his problems of chronic fatigue, although he certainly has enough organic disease to create problems of excessive fatigue. We attempted to start him on Cymbalta, but he never did start it because the pharmacy said that he might be allergic to it patient had previously complained of fatigue on Cymbalta. He is not allergic to this medication. He does not want to start it now.  Chronic fatigue: See notes under depression  Chronic systolic congestive heart failure: Compensated. May be linked to his complaints of fatigue.  Long term current use of anticoagulant therapy: Continues on Eliquis for atrial fibrillation as well as congestive heart failure.  Urinary frequency: Interrupts sleep. Denies dysuria. He has been to see the urologist.    Medications: Patient's Medications  New Prescriptions   No medications on file  Previous Medications   ACETAMINOPHEN (TYLENOL) 325 MG TABLET    Take 650 mg by mouth. Take one tablet every 6 hours as needed for pain   ALLOPURINOL (ZYLOPRIM) 100 MG TABLET    Take 50 mg by mouth daily.    AMOXICILLIN (AMOXIL) 500 MG CAPSULE    Take 500 mg by mouth. Take 4 caps prior to dential   APIXABAN (ELIQUIS) 2.5 MG TABS TABLET    Take 1 tablet (2.5 mg total) by mouth 2 (two) times daily.   CHOLECALCIFEROL (VITAMIN D) 400 UNITS CAPSULE    Take 400 Units by mouth 2 (two) times daily.    FUROSEMIDE (LASIX) 40 MG TABLET    Take 1 tablet (40 mg total) by  mouth 2 (two) times daily.   IPRATROPIUM (ATROVENT) 0.06 % NASAL SPRAY    One puff in each nostril before each meal   LEVOTHYROXINE (SYNTHROID, LEVOTHROID) 100 MCG TABLET    Take 100 mcg by mouth daily before breakfast.   LORAZEPAM (ATIVAN) 0.5 MG TABLET    Take one tablet by mouth at bedtime   LOSARTAN (COZAAR) 100 MG TABLET    Take 1 tablet (100 mg total) by mouth daily.   METOPROLOL TARTRATE (LOPRESSOR) 25 MG TABLET    Take 3 tablets (75 mg  total) by mouth 2 (two) times daily.   NITROGLYCERIN (NITROSTAT) 0.4 MG SL TABLET    Place 0.4 mg under the tongue every 5 (five) minutes as needed for chest pain.    NUTRITIONAL SUPPLEMENTS (BOOST COMPACT) LIQD    Take by mouth. 237 mls daily   OLOPATADINE (PATANOL) 0.1 % OPHTHALMIC SOLUTION    One drop in each eye twice daily to help conjunctival irritation   POLYETHYLENE GLYCOL (MIRALAX / GLYCOLAX) PACKET    Take 17 g by mouth daily.   POLYVINYL ALCOHOL (LIQUIFILM TEARS) 1.4 % OPHTHALMIC SOLUTION    Place 1 drop into both eyes. One drop in both eyes twice daily as needed for dry eyes   SILODOSIN (RAPAFLO) 8 MG CAPS CAPSULE    Take 8 mg by mouth. Take one daily when completed start Tamsulosin   TIZANIDINE (ZANAFLEX) 2 MG TABLET    Take 2 mg by mouth as needed for muscle spasms.  Modified Medications   No medications on file  Discontinued Medications   TAMSULOSIN (FLOMAX) 0.4 MG CAPS CAPSULE    Take 0.4 mg by mouth. Take one 1/2 hour after same meal daily, start after Rapaflo completed   TRAMADOL (ULTRAM) 50 MG TABLET    Take 50 mg by mouth every 6 (six) hours as needed. for pain     Review of Systems  Constitutional: Positive for fatigue. Negative for fever.  HENT: Positive for hearing loss and voice change (dysphonia since his stroke.). Negative for congestion, ear discharge, ear pain, rhinorrhea, sinus pressure and tinnitus.   Eyes: Negative.   Respiratory: Negative for cough, choking, chest tightness, shortness of breath and wheezing.   Cardiovascular: Negative for palpitations and leg swelling (sometimes, not apparent today. ).  Gastrointestinal: Negative for abdominal pain, diarrhea and abdominal distention.       Chokes on swallowing since his CVA. Episodes of fecal incontinence. Hx of hemorrhoids.  Endocrine:       Hypothyroid. Mild elevations in blood sugars, although not on every check. Not using medications to treat diabetes, but does avoid sweets in the diet.  Genitourinary:  Positive for urgency and frequency.       Incontinent from time to time. Followed by Dr. Matilde Sprang.  Musculoskeletal: Positive for back pain, joint swelling (left knee), arthralgias and gait problem. Negative for neck pain and neck stiffness.  Skin: Negative for pallor and rash.  Allergic/Immunologic: Negative.   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 Recurrent CVA on 04/30/13 of the left MCA area  Hematological: Negative.   Psychiatric/Behavioral: Negative.     Filed Vitals:   10/23/14 1549  BP: 140/68  Pulse: 68  Temp: 97.8 F (36.6 C)  TempSrc: Oral  Weight: 156 lb (70.761 kg)  SpO2: 98%   Body mass index is 23.73 kg/(m^2).  Physical Exam  Constitutional: He is oriented to person, place, and time.  He appears well-developed and well-nourished. No distress.  HENT:  Severe hearing loss. Bilateral aides. Right facial droop.   Eyes:  Corrective lenses. Increased exposure of the right cornea due to post CVA facial droop.  Neck: No JVD present. No tracheal deviation present. No thyromegaly present.  Cardiovascular: Normal rate and normal heart sounds.  Frequent extrasystoles are present.  No murmur heard. Pacemaker left upper chest  Pulmonary/Chest: Effort normal. He has no wheezes. He has rales.  dry rales posterior lung bases.   Abdominal: Soft. Bowel sounds are normal. He exhibits no distension and no mass. There is no tenderness.  Musculoskeletal: Normal range of motion. He exhibits edema. He exhibits no tenderness.  Unstable gait. Using 4 wheel walker and w/c. Weaker on the right side. Hx of the right total knee replacement.  1+ edema to right LE  Lymphadenopathy:    He has no cervical adenopathy.  Neurological: He is alert and oriented to person, place, and time. He displays normal reflexes. No cranial nerve deficit. He exhibits normal muscle tone. Coordination normal.  Slurred speech. Right side weakness.  C/o left foot spasm   Skin: Skin is warm and dry. No rash noted. No erythema. No pallor.  Psychiatric: He has a normal mood and affect. His behavior is normal. Judgment and thought content normal.  Nursing note and vitals reviewed.    Labs reviewed: Lab on 09/18/2014  Component Date Value Ref Range Status  . Hemoglobin 09/16/2014 14.3  13.5 - 17.5 g/dL Final  . HCT 09/16/2014 42  41 - 53 % Final  . Platelets 09/16/2014 223  150 - 399 K/L Final  . WBC 09/16/2014 5.7   Final  . Glucose 09/16/2014 108   Final  . BUN 09/16/2014 22* 4 - 21 mg/dL Final  . Creatinine 09/16/2014 0.9  0.6 - 1.3 mg/dL Final  . Potassium 09/16/2014 4.0  3.4 - 5.3 mmol/L Final  . Sodium 09/16/2014 136* 137 - 147 mmol/L Final  . Alkaline Phosphatase 09/16/2014 87  25 - 125 U/L Final  . ALT 09/16/2014 12  10 - 40 U/L Final  . AST 09/16/2014 17  14 - 40 U/L Final  . Bilirubin, Total 09/16/2014 1.0   Final  . TSH 09/16/2014 2.96  0.41 - 5.90 uIU/mL Final  Lab on 08/05/2014  Component Date Value Ref Range Status  . Glucose 07/29/2014 97   Final  . BUN 07/29/2014 26* 4 - 21 mg/dL Final  . Creatinine 07/29/2014 0.9  0.6 - 1.3 mg/dL Final  . Potassium 07/29/2014 4.2  3.4 - 5.3 mmol/L Final  . Sodium 07/29/2014 138  137 - 147 mmol/L Final  Clinical Support on 07/25/2014  Component Date Value Ref Range Status  . Date Time Interrogation Session 07/25/2014 [id number]   Preliminary  . Pulse Generator Manufacturer 07/25/2014 MERM   Preliminary  . Pulse Gen Model 07/25/2014 RVDR01 Revo MRI   Preliminary  . Pulse Gen Serial Number 07/25/2014 [id number]   Preliminary  . Implantable Pulse Generator Type 07/25/2014 Implantable Pulse Generator   Preliminary  . Implantable Pulse Generator Implan* 07/25/2014 [id number]   Preliminary  . Lead Channel Setting Sensing Sensi* 07/25/2014 0.9   Preliminary  . Lead Channel Setting Pacing Pulse * 07/25/2014 0.4   Preliminary  . Lead Channel Setting Pacing Amplit*  07/25/2014 2.5   Preliminary  . Zone Setting Type Category 07/25/2014 VF   Preliminary  . Zone Setting Type Category 07/25/2014 VT   Preliminary  . Zone Setting Type Category  07/25/2014 VENTRICULAR_TACHYCARDIA_1   Preliminary  . Zone Setting Type Category 07/25/2014 VENTRICULAR_TACHYCARDIA_2   Preliminary  . Zone Setting Detection Interval 07/25/2014 400   Preliminary  . Zone Setting Type Category 07/25/2014 ATRIAL_FIBRILLATION   Preliminary  . Zone Setting Type Category 07/25/2014 ATAF   Preliminary  . Zone Setting Detection Interval 07/25/2014 350   Preliminary  . Lead Channel Impedance Value 07/25/2014 432   Preliminary  . Lead Channel Impedance Value 07/25/2014 448   Preliminary  . Lead Channel Sensing Intrinsic Amp* 07/25/2014 9.818   Preliminary  . Battery Status 07/25/2014 OK   Preliminary  . Battery Voltage 07/25/2014 2.96   Preliminary  . Loletha Grayer Statistic RV Percent Paced 07/25/2014 41.43   Preliminary  . Eval Rhythm 07/25/2014 AF/VP   Preliminary     Assessment/Plan  1. Fecal incontinence Added Benefiber 1 tablespoon daily  2. Rhinorrhea Advised patient I did not feel there are any more medications. I think his chronic problem with him his not amenable to medication. He did try Atrovent NS the past, which helps some. But it seemed associated with a dry mouth, and he does not want to use it any more  3. Pain in joint, lower leg, left Chronic discomfort. Patient using wheelchair. He is getting trained in use of electric wheelchair. This may extend his arises of activity and life satisfaction.  4. Chalazion of right lower eyelid Improving  5. Cellulitis of leg, right Resolved  6. Essential hypertension Controlled  7. Dyspnea Persistent. Unchanged.  8. Edema Improved  9. Chronic atrial fibrillation Anticoagulated and rate controlled  10. Cerebral infarction due to thrombosis of carotid artery Persistent problems of fatigue, slurred speech, and loss of  balance  11. Type 2 diabetes mellitus with diabetic chronic kidney disease Controlled  12. Depression due to stroke Continue to monitor closely. Patient should probably be on antidepressant, but he does not want to start one now  38. Chronic fatigue Multifactorial with the presence of substantial organic disease including his previous stroke and congestive heart failure. Most likely related to his depression as well.  14. Chronic systolic congestive heart failure Compensated  15. Long term current use of anticoagulant therapy Continue Eliquis  16. Urinary frequency Continue appointments with Dr. Matilde Sprang

## 2014-10-24 DIAGNOSIS — H0012 Chalazion right lower eyelid: Secondary | ICD-10-CM | POA: Insufficient documentation

## 2014-10-27 ENCOUNTER — Ambulatory Visit (INDEPENDENT_AMBULATORY_CARE_PROVIDER_SITE_OTHER): Payer: Medicare Other | Admitting: *Deleted

## 2014-10-27 DIAGNOSIS — I495 Sick sinus syndrome: Secondary | ICD-10-CM | POA: Diagnosis not present

## 2014-10-28 DIAGNOSIS — R29898 Other symptoms and signs involving the musculoskeletal system: Secondary | ICD-10-CM | POA: Diagnosis not present

## 2014-10-28 DIAGNOSIS — M2550 Pain in unspecified joint: Secondary | ICD-10-CM | POA: Diagnosis not present

## 2014-10-28 DIAGNOSIS — M25562 Pain in left knee: Secondary | ICD-10-CM | POA: Diagnosis not present

## 2014-10-28 DIAGNOSIS — I69954 Hemiplegia and hemiparesis following unspecified cerebrovascular disease affecting left non-dominant side: Secondary | ICD-10-CM | POA: Diagnosis not present

## 2014-10-28 DIAGNOSIS — D472 Monoclonal gammopathy: Secondary | ICD-10-CM | POA: Diagnosis not present

## 2014-10-28 DIAGNOSIS — R29818 Other symptoms and signs involving the nervous system: Secondary | ICD-10-CM | POA: Diagnosis not present

## 2014-10-28 NOTE — Progress Notes (Signed)
Remote pacemaker transmission.   

## 2014-10-30 DIAGNOSIS — M25562 Pain in left knee: Secondary | ICD-10-CM | POA: Diagnosis not present

## 2014-10-30 DIAGNOSIS — R29898 Other symptoms and signs involving the musculoskeletal system: Secondary | ICD-10-CM | POA: Diagnosis not present

## 2014-10-30 DIAGNOSIS — D472 Monoclonal gammopathy: Secondary | ICD-10-CM | POA: Diagnosis not present

## 2014-10-30 DIAGNOSIS — I69954 Hemiplegia and hemiparesis following unspecified cerebrovascular disease affecting left non-dominant side: Secondary | ICD-10-CM | POA: Diagnosis not present

## 2014-10-30 DIAGNOSIS — R29818 Other symptoms and signs involving the nervous system: Secondary | ICD-10-CM | POA: Diagnosis not present

## 2014-10-30 DIAGNOSIS — M2550 Pain in unspecified joint: Secondary | ICD-10-CM | POA: Diagnosis not present

## 2014-10-31 DIAGNOSIS — M2550 Pain in unspecified joint: Secondary | ICD-10-CM | POA: Diagnosis not present

## 2014-10-31 DIAGNOSIS — D472 Monoclonal gammopathy: Secondary | ICD-10-CM | POA: Diagnosis not present

## 2014-10-31 DIAGNOSIS — R29818 Other symptoms and signs involving the nervous system: Secondary | ICD-10-CM | POA: Diagnosis not present

## 2014-10-31 DIAGNOSIS — M25562 Pain in left knee: Secondary | ICD-10-CM | POA: Diagnosis not present

## 2014-10-31 DIAGNOSIS — R29898 Other symptoms and signs involving the musculoskeletal system: Secondary | ICD-10-CM | POA: Diagnosis not present

## 2014-10-31 DIAGNOSIS — I69954 Hemiplegia and hemiparesis following unspecified cerebrovascular disease affecting left non-dominant side: Secondary | ICD-10-CM | POA: Diagnosis not present

## 2014-10-31 LAB — CUP PACEART REMOTE DEVICE CHECK
Brady Statistic RV Percent Paced: 32.57 %
Date Time Interrogation Session: 20160822153811
Lead Channel Impedance Value: 416 Ohm
Lead Channel Impedance Value: 432 Ohm
Lead Channel Sensing Intrinsic Amplitude: 7.787 mV
Lead Channel Setting Sensing Sensitivity: 0.9 mV
MDC IDC MSMT BATTERY VOLTAGE: 2.96 V
MDC IDC SET LEADCHNL RV PACING AMPLITUDE: 2.5 V
MDC IDC SET LEADCHNL RV PACING PULSEWIDTH: 0.4 ms
MDC IDC SET ZONE DETECTION INTERVAL: 350 ms
Zone Setting Detection Interval: 400 ms

## 2014-11-03 DIAGNOSIS — R29818 Other symptoms and signs involving the nervous system: Secondary | ICD-10-CM | POA: Diagnosis not present

## 2014-11-03 DIAGNOSIS — M25562 Pain in left knee: Secondary | ICD-10-CM | POA: Diagnosis not present

## 2014-11-03 DIAGNOSIS — D472 Monoclonal gammopathy: Secondary | ICD-10-CM | POA: Diagnosis not present

## 2014-11-03 DIAGNOSIS — I69954 Hemiplegia and hemiparesis following unspecified cerebrovascular disease affecting left non-dominant side: Secondary | ICD-10-CM | POA: Diagnosis not present

## 2014-11-03 DIAGNOSIS — R29898 Other symptoms and signs involving the musculoskeletal system: Secondary | ICD-10-CM | POA: Diagnosis not present

## 2014-11-03 DIAGNOSIS — M2550 Pain in unspecified joint: Secondary | ICD-10-CM | POA: Diagnosis not present

## 2014-11-06 DIAGNOSIS — R29818 Other symptoms and signs involving the nervous system: Secondary | ICD-10-CM | POA: Diagnosis not present

## 2014-11-06 DIAGNOSIS — M25562 Pain in left knee: Secondary | ICD-10-CM | POA: Diagnosis not present

## 2014-11-06 DIAGNOSIS — R29898 Other symptoms and signs involving the musculoskeletal system: Secondary | ICD-10-CM | POA: Diagnosis not present

## 2014-11-06 DIAGNOSIS — I69954 Hemiplegia and hemiparesis following unspecified cerebrovascular disease affecting left non-dominant side: Secondary | ICD-10-CM | POA: Diagnosis not present

## 2014-11-06 DIAGNOSIS — I509 Heart failure, unspecified: Secondary | ICD-10-CM | POA: Diagnosis not present

## 2014-11-06 DIAGNOSIS — D472 Monoclonal gammopathy: Secondary | ICD-10-CM | POA: Diagnosis not present

## 2014-11-06 DIAGNOSIS — I4891 Unspecified atrial fibrillation: Secondary | ICD-10-CM | POA: Diagnosis not present

## 2014-11-06 DIAGNOSIS — E039 Hypothyroidism, unspecified: Secondary | ICD-10-CM | POA: Diagnosis not present

## 2014-11-06 DIAGNOSIS — M2550 Pain in unspecified joint: Secondary | ICD-10-CM | POA: Diagnosis not present

## 2014-11-12 DIAGNOSIS — D472 Monoclonal gammopathy: Secondary | ICD-10-CM | POA: Diagnosis not present

## 2014-11-12 DIAGNOSIS — R29898 Other symptoms and signs involving the musculoskeletal system: Secondary | ICD-10-CM | POA: Diagnosis not present

## 2014-11-12 DIAGNOSIS — M25562 Pain in left knee: Secondary | ICD-10-CM | POA: Diagnosis not present

## 2014-11-12 DIAGNOSIS — I69954 Hemiplegia and hemiparesis following unspecified cerebrovascular disease affecting left non-dominant side: Secondary | ICD-10-CM | POA: Diagnosis not present

## 2014-11-12 DIAGNOSIS — M2550 Pain in unspecified joint: Secondary | ICD-10-CM | POA: Diagnosis not present

## 2014-11-12 DIAGNOSIS — R29818 Other symptoms and signs involving the nervous system: Secondary | ICD-10-CM | POA: Diagnosis not present

## 2014-11-17 ENCOUNTER — Encounter: Payer: Self-pay | Admitting: Cardiology

## 2014-11-18 DIAGNOSIS — I69954 Hemiplegia and hemiparesis following unspecified cerebrovascular disease affecting left non-dominant side: Secondary | ICD-10-CM | POA: Diagnosis not present

## 2014-11-18 DIAGNOSIS — M25562 Pain in left knee: Secondary | ICD-10-CM | POA: Diagnosis not present

## 2014-11-18 DIAGNOSIS — R29898 Other symptoms and signs involving the musculoskeletal system: Secondary | ICD-10-CM | POA: Diagnosis not present

## 2014-11-18 DIAGNOSIS — R29818 Other symptoms and signs involving the nervous system: Secondary | ICD-10-CM | POA: Diagnosis not present

## 2014-11-18 DIAGNOSIS — D472 Monoclonal gammopathy: Secondary | ICD-10-CM | POA: Diagnosis not present

## 2014-11-18 DIAGNOSIS — M2550 Pain in unspecified joint: Secondary | ICD-10-CM | POA: Diagnosis not present

## 2014-11-20 DIAGNOSIS — M1712 Unilateral primary osteoarthritis, left knee: Secondary | ICD-10-CM | POA: Diagnosis not present

## 2014-11-20 DIAGNOSIS — Z96651 Presence of right artificial knee joint: Secondary | ICD-10-CM | POA: Diagnosis not present

## 2014-11-20 DIAGNOSIS — Z471 Aftercare following joint replacement surgery: Secondary | ICD-10-CM | POA: Diagnosis not present

## 2014-11-24 DIAGNOSIS — R609 Edema, unspecified: Secondary | ICD-10-CM | POA: Diagnosis not present

## 2014-11-24 DIAGNOSIS — I10 Essential (primary) hypertension: Secondary | ICD-10-CM | POA: Diagnosis not present

## 2014-11-24 DIAGNOSIS — R0602 Shortness of breath: Secondary | ICD-10-CM | POA: Diagnosis not present

## 2014-11-24 DIAGNOSIS — R509 Fever, unspecified: Secondary | ICD-10-CM | POA: Diagnosis not present

## 2014-11-25 DIAGNOSIS — D472 Monoclonal gammopathy: Secondary | ICD-10-CM | POA: Diagnosis not present

## 2014-11-25 DIAGNOSIS — R29818 Other symptoms and signs involving the nervous system: Secondary | ICD-10-CM | POA: Diagnosis not present

## 2014-11-25 DIAGNOSIS — M2550 Pain in unspecified joint: Secondary | ICD-10-CM | POA: Diagnosis not present

## 2014-11-25 DIAGNOSIS — R29898 Other symptoms and signs involving the musculoskeletal system: Secondary | ICD-10-CM | POA: Diagnosis not present

## 2014-11-25 DIAGNOSIS — M25562 Pain in left knee: Secondary | ICD-10-CM | POA: Diagnosis not present

## 2014-11-25 DIAGNOSIS — I69954 Hemiplegia and hemiparesis following unspecified cerebrovascular disease affecting left non-dominant side: Secondary | ICD-10-CM | POA: Diagnosis not present

## 2014-11-25 LAB — BASIC METABOLIC PANEL
BUN: 21 mg/dL (ref 4–21)
CREATININE: 1 mg/dL (ref 0.6–1.3)
GLUCOSE: 116 mg/dL
POTASSIUM: 4.7 mmol/L (ref 3.4–5.3)
Sodium: 137 mmol/L (ref 137–147)

## 2014-11-25 LAB — CBC AND DIFFERENTIAL
HCT: 44 % (ref 41–53)
Hemoglobin: 15 g/dL (ref 13.5–17.5)
Platelets: 200 10*3/uL (ref 150–399)
WBC: 7.2 10*3/mL

## 2014-11-27 ENCOUNTER — Encounter: Payer: Self-pay | Admitting: Nurse Practitioner

## 2014-11-27 ENCOUNTER — Non-Acute Institutional Stay: Payer: Medicare Other | Admitting: Nurse Practitioner

## 2014-11-27 DIAGNOSIS — K5900 Constipation, unspecified: Secondary | ICD-10-CM | POA: Diagnosis not present

## 2014-11-27 DIAGNOSIS — M1000 Idiopathic gout, unspecified site: Secondary | ICD-10-CM

## 2014-11-27 DIAGNOSIS — N401 Enlarged prostate with lower urinary tract symptoms: Secondary | ICD-10-CM | POA: Diagnosis not present

## 2014-11-27 DIAGNOSIS — E039 Hypothyroidism, unspecified: Secondary | ICD-10-CM

## 2014-11-27 DIAGNOSIS — I5022 Chronic systolic (congestive) heart failure: Secondary | ICD-10-CM | POA: Diagnosis not present

## 2014-11-27 DIAGNOSIS — F4323 Adjustment disorder with mixed anxiety and depressed mood: Secondary | ICD-10-CM

## 2014-11-27 DIAGNOSIS — I10 Essential (primary) hypertension: Secondary | ICD-10-CM

## 2014-11-27 NOTE — Assessment & Plan Note (Signed)
Stable, continue Lorazepam 0.25mg  nightly.

## 2014-11-27 NOTE — Assessment & Plan Note (Signed)
Compensated clinically, continue Furosemide 40mg , prn Furosemide.

## 2014-11-27 NOTE — Assessment & Plan Note (Signed)
continue Levothyroxine 62mcg, TSH 4.551 06/27/14, 2.965 09/16/14

## 2014-11-27 NOTE — Assessment & Plan Note (Signed)
Stable, continue Benefiber and prn MiraLax

## 2014-11-27 NOTE — Assessment & Plan Note (Signed)
No flare ups. continue Allopurinol 50mg daily.  

## 2014-11-27 NOTE — Assessment & Plan Note (Signed)
Baseline urinary frequency, continue Tamsulosin 0.4mg 

## 2014-11-27 NOTE — Progress Notes (Signed)
Patient ID: Jon [name], male   DOB: Dec 10, 1921, 81 y.o.   MRN: [id number]  Location:  [facility] Provider:  Marlana Latus NP  Code Status:  DNR Goals of care: Advanced Directive information    Chief Complaint  Patient presents with  . Medical Management of Chronic Issues  . Acute Visit    elevated blood pressure     HPI: Patient is a 81 y.o. male seen in the [facility] at [facility] today for evaluation of elevated blood pressure, he denied chest pain, pressure, palpitation, increased cough, SOB, DOE, or sputum production, CXR 11/24/14 showed no acute cardiac or pulmonary pathology, CBC and BMP wnl 11/25/14, 11/26/14 urine culture showed no growth.   Review of Systems:  Review of Systems  Constitutional: Negative for fever.  HENT: Positive for hearing loss. Negative for congestion, ear discharge, ear pain and tinnitus.   Eyes: Negative.   Respiratory: Negative for cough, shortness of breath and wheezing.   Cardiovascular: Positive for leg swelling (sometimes, not apparent today. ). Negative for palpitations.       Trace edema BLE  Gastrointestinal: Negative for abdominal pain and diarrhea.       Chokes on swallowing since his CVA. Episodes of fecal incontinence. Hx of hemorrhoids.  Genitourinary: Positive for urgency and frequency.       Incontinent from time to time. Followed by Dr. Matilde Sprang.  Musculoskeletal: Positive for back pain. Negative for neck pain.  Skin: Negative for rash.  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 Recurrent CVA on 04/30/13 of the left MCA area  Psychiatric/Behavioral: Negative.     Past Medical History  Diagnosis Date  . Hypertension   . Coronary artery disease   . Arthritis   . Cataract   . Anxiety   . Blood transfusion without reported diagnosis   . Hyperlipidemia   . Heart murmur   . Allergy   . Thyroid disease   . PAF (paroxysmal atrial fibrillation)     coumadin  .  History of abdominal aortic aneurysm   . History of echocardiogram 12/2008    EF >55%; mild concentric LVH; mild MR; mild-mod TR; mild AV regurg;   . History of nuclear stress test 12/2010    lexiscan; low risk; compared to prior study, perfusion improved  . History of Doppler ultrasound 05/22/2012    LEAs; R anterior tibial artery appeared occluded; L posterior tibial shows short segment of occlusive ds  . History of Doppler ultrasound 05/22/2012    Abdominal Aortic Doppler; slight increase in fusiform aneurysm   . Stroke   . Adjustment disorder with mixed anxiety and depressed mood 04/18/2013    Post CVA depression and anxiety   . Abnormality of gait 04/18/2013  . 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 Increased exposure of the right cornea.   . Pruritic condition 01/24/2013  . Memory change 01/24/2013  . Abnormal PFTs 10/30/2012    Followed in Pulmonary clinic/ [facility]/ [place]  - PFT's 10/22/12 VC  55% no obst, DLCO 50%  - PFT's 12/20/2012 VC 83% and no obst, dLCO 55%  -11/08/2012  Walked RA x 3 laps @ 185 ft each stopped due to  End of study, not desat -11/08/12 esr 10    . Impotence of organic origin 10/03/2000  . Osteoarthrosis, unspecified whether generalized or localized, unspecified site 02/05/2003  . Hypertrophy of prostate with urinary obstruction and  other lower urinary tract symptoms (LUTS) 04/28/2004  . Gout, unspecified 11/03/2004  . Acute on chronic systolic congestive heart failure, NYHA class 2 03/16/2006    BNP 376.9 05/28/13   . Atherosclerosis of renal artery 10/18/2006  . Unspecified vitamin D deficiency 10/18/2006  . Obstructive sleep apnea (adult) (pediatric) 07/23/2008  . Major depressive disorder, single episode, unspecified 03/05/2009  . Basal cell carcinoma of skin of other and unspecified parts of face 12/24/2009  . Internal hemorrhoids without mention of complication 00/86/7619  . Muscle weakness (generalized) 03/10/2011  . Pain in joint, lower  leg 08/04/2011  . Nocturia 10/27/2011  . Aortic aneurysm of unspecified site without mention of rupture 10/27/2011  . Fall 03/04/2011  . Hyponatremia 03/04/2011  . TIA (transient ischemic attack) 04/30/2013  . Fatigue 04/18/2013  . 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 CVA - Rt brain stroke 12/14 02/18/2013    02/19/13 angiography CT head:  Diffuse atherosclerotic irregularity and plaque formation of the distal right common carotid artery and proximal internal carotid artery without significant stenosis. Plaque ulceration is present and is a source of emboli. A small right thalamic CVA    . Dyslipidemia 11/03/2004  . Type II or unspecified type diabetes mellitus without mention of complication, not stated as uncontrolled 11/24/2004  . Spinal stenosis in cervical region 12/15/2004  . Right bundle branch block 04/19/2006  . Cardiac pacemaker in situ- MDT 10/11 03/18/2010    Medtronic revo implant in October 2011. Severe sinus bradycardia in the 30s, but not truly pacemaker dependent   . Long term (current) use of anticoagulants 03/17/2011  . Hypothyroidism 03/04/2011  . HTN (hypertension) 03/04/2011  . Atrial fibrillation 03/04/2011     He was on coumadin until his stroke last December and was switched to apixaban 2.22m daily which he has been taking faithfully without reported side effects.    . SSS (sick sinus syndrome) 04/08/2014  . DM (diabetes mellitus), type 2 with renal complications 45/11/3265 . CKD stage 2 due to type 2 diabetes mellitus 07/03/2014  . Osteoarthritis of both knees 07/31/2014  . Quadriceps weakness 07/31/2014  . Balance disorder 07/31/2014  . Open wound of knee, leg (except thigh), and ankle, complicated 51/24/5809   Patient Active Problem List   Diagnosis Date Noted  . Chalazion of right lower eyelid 10/24/2014  . Right heart failure 09/03/2014  . Osteoarthritis of both knees 07/31/2014  . Quadriceps weakness 07/31/2014    . Balance disorder 07/31/2014  . Open wound of knee, leg (except thigh), and ankle, complicated 098/33/8250 . Dyspnea 07/03/2014  . DM (diabetes mellitus), type 2 with renal complications 053/97/6734 . CKD stage 2 due to type 2 diabetes mellitus 07/03/2014  . Lower back pain 05/29/2014  . SSS (sick sinus syndrome) 04/08/2014  . Constipation 03/27/2014  . Soreness of tongue 03/27/2014  . Fecal incontinence 02/17/2014  . Chronic systolic congestive heart failure 02/17/2014  . Urgency of urination 02/13/2014  . Hemorrhoid 02/13/2014  . Cerebral infarction due to embolism of cerebral artery 01/06/2014  . UTI (urinary tract infection) 12/26/2013  . GERD (gastroesophageal reflux disease) 10/24/2013  . Epistaxis 10/22/2013  . Dysphagia, unspecified(787.20) 10/16/2013  . Cough 09/12/2013  . Foot spasms 09/12/2013  . Rhinorrhea 08/15/2013  . Edema 06/01/2013  . TIA (transient ischemic attack) 04/30/2013  . History of ischemic left MCA stroke 04/30/2013  . Speech and language deficit due to old cerebral  infarction 04/18/2013  . Abnormality of gait 04/18/2013  . Adjustment disorder with mixed anxiety and depressed mood 04/18/2013  . Fatigue 04/18/2013  . 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 CVA - Rt brain stroke 12/14 02/18/2013  . Memory change 01/24/2013  . Pruritic condition 01/24/2013  . Abnormal PFTs 10/30/2012  . Allergic rhinitis 09/13/2012  . Aortic aneurysm of unspecified site without mention of rupture 10/27/2011  . Nocturia 10/27/2011  . Pain in left knee 08/04/2011  . Long term current use of anticoagulant therapy 03/17/2011  . Muscle weakness (generalized) 03/10/2011  . Atrial fibrillation 03/04/2011  . Fall 03/04/2011  . HTN (hypertension) 03/04/2011  . Hypothyroidism 03/04/2011  . Internal hemorrhoids without mention of complication 46/96/2952  . Cardiac pacemaker in situ- MDT 10/11 03/18/2010  . Basal cell carcinoma of skin of other  and unspecified parts of face 12/24/2009  . Other specified cardiac dysrhythmias(427.89) 12/24/2009  . Unspecified sinusitis (chronic) 07/01/2009  . Major depressive disorder, single episode, unspecified 03/05/2009  . Osteoarthrosis, unspecified whether generalized or localized, lower leg 01/19/2009  . Syncope and collapse 01/19/2009  . Obstructive sleep apnea (adult) (pediatric) 07/23/2008  . Unspecified vitamin D deficiency 10/18/2006  . Atherosclerosis of renal artery 10/18/2006  . Pain in joint, shoulder region 10/18/2006  . Right bundle branch block 04/19/2006  . Urinary frequency 03/16/2006  . Spinal stenosis in cervical region 12/15/2004  . Type 2 diabetes mellitus with circulatory disorder 11/24/2004  . Dyslipidemia 11/03/2004  . Gout 11/03/2004  . Coronary atherosclerosis 11/03/2004  . Enlarged prostate with lower urinary tract symptoms (LUTS) 04/28/2004  . Osteoarthritis 02/05/2003  . Impotence of organic origin 10/03/2000    Allergies  Allergen Reactions  . Caduet [Amlodipine-Atorvastatin] Other (See Comments)    Weakness   . Crestor [Rosuvastatin] Other (See Comments)    Muscle pain/weakness  . Lipitor [Atorvastatin Calcium] Other (See Comments)    Muscle pain/weakness  . Lisinopril Other (See Comments)    Doesn't remember reaction  . Quinolones     Risk of rupture of AAA  . Simvastatin Other (See Comments)    Muscle pain/weakness    Medications: Patient's Medications  New Prescriptions   No medications on file  Previous Medications   ACETAMINOPHEN (TYLENOL) 325 MG TABLET    Take 650 mg by mouth. Take one tablet every 6 hours as needed for pain   ALLOPURINOL (ZYLOPRIM) 100 MG TABLET    Take 50 mg by mouth daily.    AMOXICILLIN (AMOXIL) 500 MG CAPSULE    Take 500 mg by mouth. Take 4 caps prior to dential   APIXABAN (ELIQUIS) 2.5 MG TABS TABLET    Take 1 tablet (2.5 mg total) by mouth 2 (two) times daily.   CHOLECALCIFEROL (VITAMIN D) 400 UNITS CAPSULE     Take 400 Units by mouth 2 (two) times daily.    FUROSEMIDE (LASIX) 40 MG TABLET    Take 1 tablet (40 mg total) by mouth 2 (two) times daily.   IPRATROPIUM (ATROVENT) 0.06 % NASAL SPRAY    One puff in each nostril before each meal   LEVOTHYROXINE (SYNTHROID, LEVOTHROID) 100 MCG TABLET    Take 100 mcg by mouth daily before breakfast.   LORAZEPAM (ATIVAN) 0.5 MG TABLET    Take one tablet by mouth at bedtime   LOSARTAN (COZAAR) 100 MG TABLET    Take 1 tablet (100 mg total) by mouth daily.   METOPROLOL TARTRATE (LOPRESSOR) 25 MG TABLET  Take 3 tablets (75 mg total) by mouth 2 (two) times daily.   NITROGLYCERIN (NITROSTAT) 0.4 MG SL TABLET    Place 0.4 mg under the tongue every 5 (five) minutes as needed for chest pain.    NUTRITIONAL SUPPLEMENTS (BOOST COMPACT) LIQD    Take by mouth. 237 mls daily   OLOPATADINE (PATANOL) 0.1 % OPHTHALMIC SOLUTION    One drop in each eye twice daily to help conjunctival irritation   POLYETHYLENE GLYCOL (MIRALAX / GLYCOLAX) PACKET    Take 17 g by mouth daily.   POLYVINYL ALCOHOL (LIQUIFILM TEARS) 1.4 % OPHTHALMIC SOLUTION    Place 1 drop into both eyes. One drop in both eyes twice daily as needed for dry eyes   SILODOSIN (RAPAFLO) 8 MG CAPS CAPSULE    Take 8 mg by mouth. Take one daily when completed start Tamsulosin   TIZANIDINE (ZANAFLEX) 2 MG TABLET    Take 2 mg by mouth as needed for muscle spasms.  Modified Medications   No medications on file  Discontinued Medications   No medications on file    Physical Exam: Filed Vitals:   11/27/14 1124  BP: 170/90  Pulse: 66  Temp: 98.6 F (37 C)  TempSrc: Tympanic  Resp: 16   There is no weight on file to calculate BMI.  Physical Exam  Constitutional: He is oriented to person, place, and time. He appears well-developed and well-nourished. No distress.  HENT:  Severe hearing loss. Bilateral aides. Right facial droop.   Eyes:  Corrective lenses. Increased exposure of the right cornea due to post CVA facial  droop.  Neck: No JVD present. No tracheal deviation present. No thyromegaly present.  Cardiovascular: Normal rate and normal heart sounds.  Frequent extrasystoles are present.  No murmur heard. Pacemaker left upper chest  Pulmonary/Chest: Effort normal. He has no wheezes. He has rales.  dry rales posterior lung bases.   Abdominal: Soft. Bowel sounds are normal. He exhibits no distension and no mass. There is no tenderness.  Musculoskeletal: Normal range of motion. He exhibits edema. He exhibits no tenderness.  Unstable gait. Using 4 wheel walker and w/c. Weaker on the right side. Hx of the right total knee replacement. Trace edema BLE  Lymphadenopathy:    He has no cervical adenopathy.  Neurological: He is alert and oriented to person, place, and time. He displays normal reflexes. No cranial nerve deficit. He exhibits normal muscle tone. Coordination normal.  Slurred speech. Right side weakness. C/o left foot spasm   Skin: Skin is warm and dry. No rash noted. No erythema. No pallor.  Psychiatric: He has a normal mood and affect. His behavior is normal. Judgment and thought content normal.  Nursing note and vitals reviewed.   Labs reviewed: Basic Metabolic Panel:  Recent Labs  07/29/14 09/16/14 11/25/14  NA 138 136* 137  K 4.2 4.0 4.7  BUN 26* 22* 21  CREATININE 0.9 0.9 1.0    Liver Function Tests:  Recent Labs  05/30/14 09/16/14  AST 14 17  ALT 10 12  ALKPHOS 93 87    CBC:  Recent Labs  05/30/14 09/16/14 11/25/14  WBC 7.2 5.7 7.2  HGB 13.9 14.3 15.0  HCT 41 42 44  PLT 234 223 200    Lab Results  Component Value Date   TSH 2.96 09/16/2014   Lab Results  Component Value Date   HGBA1C 6.5* 08/29/2013   Lab Results  Component Value Date   CHOL 117 05/01/2013   HDL  30* 05/01/2013   LDLCALC 71 05/01/2013   TRIG 79 05/01/2013   CHOLHDL 3.9 05/01/2013    Significant Diagnostic Results since last visit: none  Patient Care Team: [name], MD as PCP -  General (Internal Medicine) [place] Pixie Casino, MD as Consulting Physician (Cardiology)  Assessment/Plan Problem List Items Addressed This Visit    HTN (hypertension) (Chronic)    Elevated Bp 170/90, increase Metoprolol 100 mg twice a day and Losartan 182m daily controlled      Hypothyroidism (Chronic)    continue Levothyroxine 725m, TSH 4.551 06/27/14, 2.965 09/16/14       Chronic systolic congestive heart failure (Chronic)    Compensated clinically, continue Furosemide 4038mprn Furosemide.       Gout    No flare ups. continue Allopurinol 46m56mily.       Enlarged prostate with lower urinary tract symptoms (LUTS)    Baseline urinary frequency, continue Tamsulosin 0.4mg 41m  Adjustment disorder with mixed anxiety and depressed mood    Stable, continue Lorazepam 0.25mg 31mtly.       Constipation - Primary    Stable, continue Benefiber and prn MiraLax          Family/ staff Communication: continue to observe.  Labs/tests ordered: none  [name] NP [facility] [place] [id number]:  [phone] prompts after 5pm & weekends Office Phone:  [phone] Fax:  [phone]

## 2014-11-27 NOTE — Assessment & Plan Note (Signed)
Elevated Bp 170/90, increase Metoprolol 100 mg twice a day and Losartan 100mg  daily controlled

## 2014-11-28 ENCOUNTER — Encounter: Payer: Self-pay | Admitting: Cardiovascular Disease

## 2014-12-02 ENCOUNTER — Encounter: Payer: Self-pay | Admitting: Internal Medicine

## 2014-12-02 ENCOUNTER — Ambulatory Visit (INDEPENDENT_AMBULATORY_CARE_PROVIDER_SITE_OTHER): Payer: Medicare Other | Admitting: Internal Medicine

## 2014-12-02 VITALS — BP 110/62 | HR 61 | Ht 68.0 in | Wt 168.2 lb

## 2014-12-02 DIAGNOSIS — I5022 Chronic systolic (congestive) heart failure: Secondary | ICD-10-CM

## 2014-12-02 DIAGNOSIS — R32 Unspecified urinary incontinence: Secondary | ICD-10-CM

## 2014-12-02 DIAGNOSIS — I255 Ischemic cardiomyopathy: Secondary | ICD-10-CM

## 2014-12-02 DIAGNOSIS — R0602 Shortness of breath: Secondary | ICD-10-CM

## 2014-12-02 DIAGNOSIS — Z79899 Other long term (current) drug therapy: Secondary | ICD-10-CM

## 2014-12-02 DIAGNOSIS — Z950 Presence of cardiac pacemaker: Secondary | ICD-10-CM

## 2014-12-02 DIAGNOSIS — I509 Heart failure, unspecified: Secondary | ICD-10-CM

## 2014-12-02 DIAGNOSIS — I482 Chronic atrial fibrillation, unspecified: Secondary | ICD-10-CM

## 2014-12-02 DIAGNOSIS — I50810 Right heart failure, unspecified: Secondary | ICD-10-CM

## 2014-12-02 DIAGNOSIS — R609 Edema, unspecified: Secondary | ICD-10-CM

## 2014-12-02 NOTE — Patient Instructions (Addendum)
Your physician has recommended you make the following change in your medication: INCREASE lasix to 40mg  TWICE DAILY  Please have lab work next week   Your physician recommends that you schedule a follow-up appointment in: 2 weeks with Dr. Debara Pickett - OK to double book  Dr. Debara Pickett has referred you to Dr. Louis Meckel with [facility]

## 2014-12-03 NOTE — Progress Notes (Signed)
OFFICE NOTE  Chief Complaint:  Follow-up, swelling and weight gain  Primary Care Physician: Jon Dooms, MD  HPI:  Jon [name]  is a 81 yo male formerly followed by Dr. Rex [name] and recently seen by Jon Kicks, NP, for left lower extremity edema with discoloration of his toes, was beginning to be uncomfortable for him to walk. We did venous Dopplers. He was quite concerned about arterial blood supply. His mother ended up with bilateral amputations. Venous Dopplers showed a ruptured baker cyst, no DVT. He was instructed on this and since that time his symptoms have resolved completely. He is on Coumadin for paroxysmal A-fib which led to the discoloration in his toes. Additionally, he has a history of abdominal aortic aneurysm and because he was concerned about his arterial disease, if he had it, we did Dopplers.  He is here for the results of the Dopplers. Bilateral ABIs were 1.0, though he does have appearance of an occluded right anterior tibial artery and a left posterior tib demonstrated a short segment of occlusive disease with reconstitution at the ankle. His pulses have been 2+. He has no claudication symptoms. Additionally, the duplex of his abdominal aorta was slightly, minimally changed from his last one. Previously it was 3.95 x 3.57, now he is 3.7 x 4.0, essentially the same, very slight increase. He has no abdominal pain and no other complaints. He has really no complaints today, feels quite well. He is not aware of any tachycardias or palpitations. He has a Medtronic pacemaker which was interrogated today. This demonstrates a battery voltage of 3.0V.  His a-fib burden is 1.6% and he is on amiodarone.  Other history includes bypass grafting in 1989; vein graft was stented in 2008. Last stress test was 2012, low risk study. EF was 49%. Pacemaker was placed for paroxysmal A-fib and bradycardia, sick sinus syndrome in 2011. He is also on warfarin.   Mr. Jon [name] underwent PFTs on  10/22/2012. This showed a moderately severe restrictive limitation as well as moderately reduced diffusion capacity. Based on these findings I recommended discontinuing his amiodarone and he also stopped his pravastatin. Since that time he's noted that he feels quite a bit better including some minor improvement in his knee pain. He still feels like his left knee is bothering him and he is status post right TKR. 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 I did refer him to the [facility], but has not been contacted for appointment so far.  Since his last followup, Mr. Jon [name] was seen by Dr. Sallyanne Kuster for a device check in his device appeared to be working properly. He was having atrial fibrillation, but a little burden. He continued on warfarin which was generally therapeutic. Unfortunately, in December he had a thalamic stroke which resulted in some right facial droop and hemiparesis. That is improving , but slowly. He was then changed to Eliquis for anticoagulation. He was also started on Zetia, as he has a history of statin intolerance in the past.  An echo performed in the hospital demonstrated an EF of 40-45%, which is mildly reduced from previous studies. In addition, he has had a small amount of weight gain and lower extremity swelling which is noted over the past several weeks while in recovery.  Unfortunately, he suffered another stroke last month around the time that we plan to admit him for tikosyn induction. He is still trying to recover from that.   Mr.  Jon [name] appears euvolemic today. He does have a small amount of lower extremity swelling. His weight has been stable. He is reporting some epistaxis. He is concerned about the dose of his Eliquis. He is also reporting some urinary incontinence which is new. There is some flank pain and there is some possible concern for urinary tract infection however he is awaiting the results of a urinalysis.  Mr. Jon [name] was seen in the  office today as an add-on for worsening swelling and shortness of breath. I reviewed notes from [facility] which indicate in January because of excessive urination he requested to come off of his diuretics. He was taken off the diuretics but quickly developed swelling and worsening shortness of breath. A BNP was obtained which was greater than 300. He was restarted on his diuretics and had some improvement in his shortness of breath but still has persistent symptoms. He's also developed asymmetric right lower extremity edema. He underwent an in office Doppler ultrasound that was negative for DVT. Those results are communicated in the office notes, but no formal radiology report is available. It would be unlikely for him to have DVT given the fact that he's on Eliquis. He still has persistent right lower extremity edema. He reports shortness of breath, fatigue and difficulty speaking. He denies any chest pain.  He is also overdue for pacemaker check and that was performed in the office today under my supervision.  Mr. Jon [name] returns today in the office. He reports an improvement in his shortness of breath with an increased dose of Lasix. An echocardiogram was performed which shows preserved systolic function of the left ventricle however there is significant right heart dysfunction and severe dilatation of the right ventricle. This is likely the cause of his shortness of breath. He seems to respond and diaphoretic. He reports his fatigue is improving and is currently working with physical therapy.  I saw Mr. Jon [name] back today in the office. When I last saw him he had had some leg swelling and I recommended increasing his Lasix to 40 mg twice daily. Unfortunately due to problems with incontinence he did not make those changes. Over the past 3 months there is been about a 13 pound weight gain. He has significant leg swelling. This is likely due to some right heart failure symptoms which were suspected at  his last office visit. He does get a little more short of breath with exertion as well.  PMHx:  Past Medical History  Diagnosis Date  . Hypertension   . Coronary artery disease   . Arthritis   . Cataract   . Anxiety   . Blood transfusion without reported diagnosis   . Hyperlipidemia   . Heart murmur   . Allergy   . Thyroid disease   . PAF (paroxysmal atrial fibrillation)     coumadin  . History of abdominal aortic aneurysm   . History of echocardiogram 12/2008    EF >55%; mild concentric LVH; mild MR; mild-mod TR; mild AV regurg;   . History of nuclear stress test 12/2010    lexiscan; low risk; compared to prior study, perfusion improved  . History of Doppler ultrasound 05/22/2012    LEAs; R anterior tibial artery appeared occluded; L posterior tibial shows short segment of occlusive ds  . History of Doppler ultrasound 05/22/2012    Abdominal Aortic Doppler; slight increase in fusiform aneurysm   . Stroke   . Adjustment disorder with mixed anxiety and depressed mood 04/18/2013  Post CVA depression and anxiety   . Abnormality of gait 04/18/2013  . 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 Increased exposure of the right cornea.   . Pruritic condition 01/24/2013  . Memory change 01/24/2013  . Abnormal PFTs 10/30/2012    Followed in Pulmonary clinic/ [facility]/ [place]  - PFT's 10/22/12 VC  55% no obst, DLCO 50%  - PFT's 12/20/2012 VC 83% and no obst, dLCO 55%  -11/08/2012  Walked RA x 3 laps @ 185 ft each stopped due to  End of study, not desat -11/08/12 esr 10    . Impotence of organic origin 10/03/2000  . Osteoarthrosis, unspecified whether generalized or localized, unspecified site 02/05/2003  . Hypertrophy of prostate with urinary obstruction and other lower urinary tract symptoms (LUTS) 04/28/2004  . Gout, unspecified 11/03/2004  . Acute on chronic systolic congestive heart failure, NYHA class 2 03/16/2006    BNP 376.9 05/28/13   . Atherosclerosis of renal  artery 10/18/2006  . Unspecified vitamin D deficiency 10/18/2006  . Obstructive sleep apnea (adult) (pediatric) 07/23/2008  . Major depressive disorder, single episode, unspecified 03/05/2009  . Basal cell carcinoma of skin of other and unspecified parts of face 12/24/2009  . Internal hemorrhoids without mention of complication 17/91/5056  . Muscle weakness (generalized) 03/10/2011  . Pain in joint, lower leg 08/04/2011  . Nocturia 10/27/2011  . Aortic aneurysm of unspecified site without mention of rupture 10/27/2011  . Fall 03/04/2011  . Hyponatremia 03/04/2011  . TIA (transient ischemic attack) 04/30/2013  . Fatigue 04/18/2013  . 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 CVA - Rt brain stroke 12/14 02/18/2013    02/19/13 angiography CT head:  Diffuse atherosclerotic irregularity and plaque formation of the distal right common carotid artery and proximal internal carotid artery without significant stenosis. Plaque ulceration is present and is a source of emboli. A small right thalamic CVA    . Dyslipidemia 11/03/2004  . Type II or unspecified type diabetes mellitus without mention of complication, not stated as uncontrolled 11/24/2004  . Spinal stenosis in cervical region 12/15/2004  . Right bundle branch block 04/19/2006  . Cardiac pacemaker in situ- MDT 10/11 03/18/2010    Medtronic revo implant in October 2011. Severe sinus bradycardia in the 30s, but not truly pacemaker dependent   . Long term (current) use of anticoagulants 03/17/2011  . Hypothyroidism 03/04/2011  . HTN (hypertension) 03/04/2011  . Atrial fibrillation 03/04/2011     He was on coumadin until his stroke last December and was switched to apixaban 2.$RemoveBefor'5mg'BvhMYWPcRwoN$  daily which he has been taking faithfully without reported side effects.    . SSS (sick sinus syndrome) 04/08/2014  . DM (diabetes mellitus), type 2 with renal complications 9/79/4801  . CKD stage 2 due to type 2 diabetes mellitus  07/03/2014  . Osteoarthritis of both knees 07/31/2014  . Quadriceps weakness 07/31/2014  . Balance disorder 07/31/2014  . Open wound of knee, leg (except thigh), and ankle, complicated 6/55/3748    Past Surgical History  Procedure Laterality Date  . Coronary artery bypass graft  1989  . Pacemaker insertion  2011  . Insert / replace / remove pacemaker    . Eye surgery    . Joint replacement    . Hernia repair    . Coronary angioplasty with stent placement  2008    stent to SVG to OM    FAMHx:  Family  History  Problem Relation Age of Onset  . Diabetes Father   . Other Mother     PAD with amputations    SOCHx:   reports that he quit smoking about 47 years ago. His smoking use included Cigarettes. He has a 11.25 pack-year smoking history. He has never used smokeless tobacco. He reports that he does not drink alcohol or use illicit drugs.  ALLERGIES:  Allergies  Allergen Reactions  . Caduet [Amlodipine-Atorvastatin] Other (See Comments)    Weakness   . Crestor [Rosuvastatin] Other (See Comments)    Muscle pain/weakness  . Lipitor [Atorvastatin Calcium] Other (See Comments)    Muscle pain/weakness  . Lisinopril Other (See Comments)    Doesn't remember reaction  . Quinolones     Risk of rupture of AAA  . Simvastatin Other (See Comments)    Muscle pain/weakness    ROS: A comprehensive review of systems was negative except for: Respiratory: positive for dyspnea on exertion Cardiovascular: positive for irregular heart beat and lower extremity edema  HOME MEDS: Current Outpatient Prescriptions  Medication Sig Dispense Refill  . acetaminophen (TYLENOL) 325 MG tablet Take 650 mg by mouth. Take one tablet every 6 hours as needed for pain    . allopurinol (ZYLOPRIM) 100 MG tablet Take 50 mg by mouth daily.     Marland Kitchen amoxicillin (AMOXIL) 500 MG capsule Take 500 mg by mouth. Take 4 caps prior to dential    . apixaban (ELIQUIS) 2.5 MG TABS tablet Take 1 tablet (2.5 mg total) by mouth  2 (two) times daily. 60 tablet 11  . Cholecalciferol (VITAMIN D) 400 UNITS capsule Take 400 Units by mouth 2 (two) times daily.     . furosemide (LASIX) 40 MG tablet Take 40 mg by mouth 2 (two) times daily.     Marland Kitchen ipratropium (ATROVENT) 0.06 % nasal spray One puff in each nostril before each meal 15 mL 12  . levothyroxine (SYNTHROID, LEVOTHROID) 100 MCG tablet Take 100 mcg by mouth daily before breakfast.    . LORazepam (ATIVAN) 0.5 MG tablet Take 0.25 mg by mouth at bedtime.    Marland Kitchen losartan (COZAAR) 100 MG tablet Take 1 tablet (100 mg total) by mouth daily. 30 tablet 5  . metoprolol (LOPRESSOR) 100 MG tablet Take 100 mg by mouth 2 (two) times daily.    . nitroGLYCERIN (NITROSTAT) 0.4 MG SL tablet Place 0.4 mg under the tongue every 5 (five) minutes as needed for chest pain.     . Nutritional Supplements (BOOST COMPACT) LIQD Take by mouth. 237 mls daily    . olopatadine (PATANOL) 0.1 % ophthalmic solution One drop in each eye twice daily to help conjunctival irritation 5 mL 12  . polyethylene glycol (MIRALAX / GLYCOLAX) packet Take 17 g by mouth daily.    . polyvinyl alcohol (LIQUIFILM TEARS) 1.4 % ophthalmic solution Place 1 drop into both eyes. One drop in both eyes twice daily as needed for dry eyes    . tamsulosin (FLOMAX) 0.4 MG CAPS capsule Take 0.4 mg by mouth daily.    . Wheat Dextrin (BENEFIBER PO) Take by mouth daily.     No current facility-administered medications for this visit.    LABS/IMAGING: No results found for this or any previous visit (from the past 48 hour(s)). No results found.  VITALS: BP 110/62 mmHg  Pulse 61  Ht $R'5\' 8"'PH$  (1.727 m)  Wt 168 lb 3.2 oz (76.295 kg)  BMI 25.58 kg/m2  EXAM: GEN: Awake, NAD HEENT: PERRLA,  EOMI Neck: JVP elevated to 3 cm water Cardiovascular: Irregularly irregular Abdomen: Soft, nontender, mildly distended Extremities: 1-2+ bilateral pitting edema Skin: Warm, dry Neurologic: Grossly intact Psych: Pleasant  EKG: Atrial fibrillation  with ventricular pacing at 61  ASSESSMENT: 1. Acute on chronic systolic, right sided- congestive heart failure, NYHA Class II symptoms - EF 55-60% 2. Persistent atrial fibrillation 3. Sick sinus syndrome status post pacemaker placement with normal function 4. Abnormal PFT's - off of amiodarone 5. History of abdominal aortic aneurysm with mild enlargement 6. Coronary artery disease status post CABG in 1989 with stent to the vein graft in 2008 7. Minimal PAD with normal ABIs bilaterally 8. Recent thalamic stroke - now on Eliquis 9. Epistaxis 10. Urinary incontinence  PLAN: 1.   Mr. Rathke continues to have problems with lower extremity edema and abdominal swelling related to mostly right heart failure. At his last office visit I increased his Lasix but he was noncompliant with this due to incontinence. He's now gained about 13 pounds. He is more dyspneic and is having troubles with orthopnea. I recommend increasing his Lasix now to 40 mg twice daily. He will need a referral to see urologist regarding his incontinence. Medications and not been effective in the past and he may and from a condom catheter and leg bag.  Plan to see him back in 3 months.  Pixie Casino, MD, [facility] Attending Cardiologist [facility]  Nadean Corwin Hilty 12/03/2014, 1:40 PM

## 2014-12-04 ENCOUNTER — Encounter: Payer: Self-pay | Admitting: Internal Medicine

## 2014-12-04 DIAGNOSIS — M25562 Pain in left knee: Secondary | ICD-10-CM | POA: Diagnosis not present

## 2014-12-04 DIAGNOSIS — D472 Monoclonal gammopathy: Secondary | ICD-10-CM | POA: Diagnosis not present

## 2014-12-04 DIAGNOSIS — R29818 Other symptoms and signs involving the nervous system: Secondary | ICD-10-CM | POA: Diagnosis not present

## 2014-12-04 DIAGNOSIS — R29898 Other symptoms and signs involving the musculoskeletal system: Secondary | ICD-10-CM | POA: Diagnosis not present

## 2014-12-04 DIAGNOSIS — I69954 Hemiplegia and hemiparesis following unspecified cerebrovascular disease affecting left non-dominant side: Secondary | ICD-10-CM | POA: Diagnosis not present

## 2014-12-04 DIAGNOSIS — M2550 Pain in unspecified joint: Secondary | ICD-10-CM | POA: Diagnosis not present

## 2014-12-08 DIAGNOSIS — M2550 Pain in unspecified joint: Secondary | ICD-10-CM | POA: Diagnosis not present

## 2014-12-08 DIAGNOSIS — R29818 Other symptoms and signs involving the nervous system: Secondary | ICD-10-CM | POA: Diagnosis not present

## 2014-12-08 DIAGNOSIS — I4891 Unspecified atrial fibrillation: Secondary | ICD-10-CM | POA: Diagnosis not present

## 2014-12-08 DIAGNOSIS — R29898 Other symptoms and signs involving the musculoskeletal system: Secondary | ICD-10-CM | POA: Diagnosis not present

## 2014-12-08 DIAGNOSIS — I509 Heart failure, unspecified: Secondary | ICD-10-CM | POA: Diagnosis not present

## 2014-12-08 DIAGNOSIS — E039 Hypothyroidism, unspecified: Secondary | ICD-10-CM | POA: Diagnosis not present

## 2014-12-08 DIAGNOSIS — I69954 Hemiplegia and hemiparesis following unspecified cerebrovascular disease affecting left non-dominant side: Secondary | ICD-10-CM | POA: Diagnosis not present

## 2014-12-08 DIAGNOSIS — D472 Monoclonal gammopathy: Secondary | ICD-10-CM | POA: Diagnosis not present

## 2014-12-08 DIAGNOSIS — M25562 Pain in left knee: Secondary | ICD-10-CM | POA: Diagnosis not present

## 2014-12-09 DIAGNOSIS — Z79899 Other long term (current) drug therapy: Secondary | ICD-10-CM | POA: Diagnosis not present

## 2014-12-09 DIAGNOSIS — R601 Generalized edema: Secondary | ICD-10-CM | POA: Diagnosis not present

## 2014-12-09 DIAGNOSIS — I5033 Acute on chronic diastolic (congestive) heart failure: Secondary | ICD-10-CM | POA: Diagnosis not present

## 2014-12-09 DIAGNOSIS — N3941 Urge incontinence: Secondary | ICD-10-CM | POA: Diagnosis not present

## 2014-12-09 DIAGNOSIS — R351 Nocturia: Secondary | ICD-10-CM | POA: Diagnosis not present

## 2014-12-09 LAB — BASIC METABOLIC PANEL
BUN: 24 mg/dL — AB (ref 4–21)
Creatinine: 1 mg/dL (ref 0.6–1.3)
GLUCOSE: 108 mg/dL
Potassium: 4 mmol/L (ref 3.4–5.3)
Sodium: 135 mmol/L — AB (ref 137–147)

## 2014-12-18 ENCOUNTER — Telehealth: Payer: Self-pay | Admitting: *Deleted

## 2014-12-18 ENCOUNTER — Encounter: Payer: Medicare Other | Admitting: Internal Medicine

## 2014-12-18 NOTE — Telephone Encounter (Signed)
LM for daughter Izora Gala to call in to let us know which appointment date/time worked best. Patient was accidentally scheduled for an appointment on 10/19 and 10/20 with Dr. Debara Pickett.

## 2014-12-19 ENCOUNTER — Encounter: Payer: Self-pay | Admitting: Internal Medicine

## 2014-12-22 DIAGNOSIS — I10 Essential (primary) hypertension: Secondary | ICD-10-CM | POA: Diagnosis not present

## 2014-12-23 DIAGNOSIS — R0602 Shortness of breath: Secondary | ICD-10-CM | POA: Diagnosis not present

## 2014-12-24 ENCOUNTER — Ambulatory Visit: Payer: Medicare Other | Admitting: Internal Medicine

## 2014-12-24 ENCOUNTER — Encounter: Payer: Self-pay | Admitting: Nurse Practitioner

## 2014-12-24 ENCOUNTER — Other Ambulatory Visit: Payer: Self-pay | Admitting: Nurse Practitioner

## 2014-12-24 ENCOUNTER — Non-Acute Institutional Stay: Payer: Medicare Other | Admitting: Nurse Practitioner

## 2014-12-24 DIAGNOSIS — I5022 Chronic systolic (congestive) heart failure: Secondary | ICD-10-CM

## 2014-12-24 DIAGNOSIS — E039 Hypothyroidism, unspecified: Secondary | ICD-10-CM | POA: Diagnosis not present

## 2014-12-24 DIAGNOSIS — M1000 Idiopathic gout, unspecified site: Secondary | ICD-10-CM | POA: Diagnosis not present

## 2014-12-24 DIAGNOSIS — I4891 Unspecified atrial fibrillation: Secondary | ICD-10-CM | POA: Diagnosis not present

## 2014-12-24 DIAGNOSIS — I10 Essential (primary) hypertension: Secondary | ICD-10-CM | POA: Diagnosis not present

## 2014-12-24 DIAGNOSIS — I4821 Permanent atrial fibrillation: Secondary | ICD-10-CM

## 2014-12-24 DIAGNOSIS — I482 Chronic atrial fibrillation: Secondary | ICD-10-CM | POA: Diagnosis not present

## 2014-12-24 DIAGNOSIS — N401 Enlarged prostate with lower urinary tract symptoms: Secondary | ICD-10-CM | POA: Diagnosis not present

## 2014-12-24 LAB — BASIC METABOLIC PANEL
BUN: 27 mg/dL — AB (ref 4–21)
CREATININE: 0.9 mg/dL (ref 0.6–1.3)
GLUCOSE: 111 mg/dL
Potassium: 4 mmol/L (ref 3.4–5.3)
SODIUM: 138 mmol/L (ref 137–147)

## 2014-12-24 NOTE — Assessment & Plan Note (Signed)
Baseline urinary frequency, continue Tamsulosin 0.4mg 

## 2014-12-24 NOTE — Assessment & Plan Note (Signed)
Controlled, continue Metoprolol 100 mg twice a day and Losartan 100mg  daily

## 2014-12-24 NOTE — Progress Notes (Signed)
Patient ID: Jon [name], male   DOB: 09/02/1921, 81 y.o.   MRN: [id number]  Location:  [facility] Provider:  ManXie Media Pizzini NP  Code Status:  DNR Goals of care: Advanced Directive information    Chief Complaint  Patient presents with  . Medical Management of Chronic Issues  . Acute Visit    SOB     HPI: Patient is a 81 y.o. male seen in the [facility] at [name] today for evaluation of elevated SOB, reported 12/23/14 the patient had difficulty with speech due to increased SOB, denied chest pain, palpitation, sputum production, and fever. CXR 12/23/14 showed no acute cardiac or pulmonary pathology. The patient is less SOB this morning after extra dose of Furosemide 20mg in addition to 40mg bid. Weight reduction 2 Ibs.   Review of Systems  Constitutional: Positive for malaise/fatigue. Negative for fever.       Generalized weakness  HENT: Positive for hearing loss. Negative for congestion, ear discharge, ear pain and tinnitus.   Eyes: Negative.   Respiratory: Positive for shortness of breath. Negative for cough and wheezing.   Cardiovascular: Positive for leg swelling (sometimes, not apparent today. ). Negative for palpitations.       Trace edema BLE, R>L  Gastrointestinal: Negative for abdominal pain and diarrhea.       Chokes on swallowing since his CVA. Episodes of fecal incontinence. Hx of hemorrhoids.  Genitourinary: Positive for urgency and frequency.       Incontinent from time to time. Followed by [name].  Musculoskeletal: Positive for back pain. Negative for neck pain.  Skin: Negative for rash.  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 Recurrent CVA on 04/30/13 of the left MCA area Left facial weakness  Psychiatric/Behavioral: Negative.     Past Medical History  Diagnosis Date  . Hypertension   . Coronary artery disease   . Arthritis   . Cataract   . Anxiety   . Blood transfusion without  reported diagnosis   . Hyperlipidemia   . Heart murmur   . Allergy   . Thyroid disease   . PAF (paroxysmal atrial fibrillation) (HCC)     coumadin  . History of abdominal aortic aneurysm   . History of echocardiogram 12/2008    EF >55%; mild concentric LVH; mild MR; mild-mod TR; mild AV regurg;   . History of nuclear stress test 12/2010    lexiscan; low risk; compared to prior study, perfusion improved  . History of Doppler ultrasound 05/22/2012    LEAs; R anterior tibial artery appeared occluded; L posterior tibial shows short segment of occlusive ds  . History of Doppler ultrasound 05/22/2012    Abdominal Aortic Doppler; slight increase in fusiform aneurysm   . Stroke (HCC)   . Adjustment disorder with mixed anxiety and depressed mood 04/18/2013    Post CVA depression and anxiety   . Abnormality of gait 04/18/2013  . 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 Increased exposure of the right cornea.   . Pruritic condition 01/24/2013  . Memory change 01/24/2013  . Abnormal PFTs 10/30/2012    Followed in Pulmonary clinic/ [facility]/ [place]  - PFT's 10/22/12 VC  55% no obst, DLCO 50%  - PFT's 12/20/2012 VC 83% and no obst, dLCO 55%  -11/08/2012  Walked RA x 3 laps @ 185 ft each stopped due to  End of study, not desat -11/08/12 esr   10    . Impotence of organic origin 10/03/2000  . Osteoarthrosis, unspecified whether generalized or localized, unspecified site 02/05/2003  . Hypertrophy of prostate with urinary obstruction and other lower urinary tract symptoms (LUTS) 04/28/2004  . Gout, unspecified 11/03/2004  . Acute on chronic systolic congestive heart failure, NYHA class 2 ([place]) 03/16/2006    BNP 376.9 05/28/13   . Atherosclerosis of renal artery ([place]) 10/18/2006  . Unspecified vitamin D deficiency 10/18/2006  . Obstructive sleep apnea (adult) (pediatric) 07/23/2008  . Major depressive disorder, single episode, unspecified ([place]) 03/05/2009  . Basal cell carcinoma of skin of  other and unspecified parts of face 12/24/2009  . Internal hemorrhoids without mention of complication 59/56/3875  . Muscle weakness (generalized) 03/10/2011  . Pain in joint, lower leg 08/04/2011  . Nocturia 10/27/2011  . Aortic aneurysm of unspecified site without mention of rupture 10/27/2011  . Fall 03/04/2011  . Hyponatremia 03/04/2011  . TIA (transient ischemic attack) 04/30/2013  . Fatigue 04/18/2013  . 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 CVA - Rt brain stroke 12/14 02/18/2013    02/19/13 angiography CT head:  Diffuse atherosclerotic irregularity and plaque formation of the distal right common carotid artery and proximal internal carotid artery without significant stenosis. Plaque ulceration is present and is a source of emboli. A small right thalamic CVA    . Dyslipidemia 11/03/2004  . Type II or unspecified type diabetes mellitus without mention of complication, not stated as uncontrolled 11/24/2004  . Spinal stenosis in cervical region 12/15/2004  . Right bundle branch block 04/19/2006  . Cardiac pacemaker in situ- MDT 10/11 03/18/2010    Medtronic revo implant in October 2011. Severe sinus bradycardia in the 30s, but not truly pacemaker dependent   . Long term (current) use of anticoagulants 03/17/2011  . Hypothyroidism 03/04/2011  . HTN (hypertension) 03/04/2011  . Atrial fibrillation ([place]) 03/04/2011     He was on coumadin until his stroke last December and was switched to apixaban 2.58m daily which he has been taking faithfully without reported side effects.    . SSS (sick sinus syndrome) ([place] 04/08/2014  . DM (diabetes mellitus), type 2 with renal complications ([place] 46/43/3295 . CKD stage 2 due to type 2 diabetes mellitus ([place] 07/03/2014  . Osteoarthritis of both knees 07/31/2014  . Quadriceps weakness 07/31/2014  . Balance disorder 07/31/2014  . Open wound of knee, leg (except thigh), and ankle, complicated 51/88/4166   Patient  Active Problem List   Diagnosis Date Noted  . Chalazion of right lower eyelid 10/24/2014  . Right heart failure ([place] 09/03/2014  . Osteoarthritis of both knees 07/31/2014  . Quadriceps weakness 07/31/2014  . Balance disorder 07/31/2014  . Open wound of knee, leg (except thigh), and ankle, complicated 006/30/1601 . Dyspnea 07/03/2014  . DM (diabetes mellitus), type 2 with renal complications ([place] 009/32/3557 . CKD stage 2 due to type 2 diabetes mellitus ([place] 07/03/2014  . Lower back pain 05/29/2014  . SSS (sick sinus syndrome) ([place] 04/08/2014  . Constipation 03/27/2014  . Soreness of tongue 03/27/2014  . Fecal incontinence 02/17/2014  . Chronic systolic congestive heart failure ([place] 02/17/2014  . Urgency of urination 02/13/2014  . Hemorrhoid 02/13/2014  . Cerebral infarction due to embolism of cerebral artery ([place] 01/06/2014  . UTI (urinary tract infection) 12/26/2013  . GERD (gastroesophageal reflux disease) 10/24/2013  . Epistaxis 10/22/2013  . Dysphagia, unspecified(787.20) 10/16/2013  .  Cough 09/12/2013  . Foot spasms 09/12/2013  . Rhinorrhea 08/15/2013  . Edema 06/01/2013  . TIA (transient ischemic attack) 04/30/2013  . History of ischemic left MCA stroke 04/30/2013  . 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 Abnormality of gait 04/18/2013  . Adjustment disorder with mixed anxiety and depressed mood 04/18/2013  . Fatigue 04/18/2013  . Depression due to stroke ([place]) 04/11/2013  . 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 CVA - Rt brain stroke 12/14 02/18/2013  . Memory change 01/24/2013  . Pruritic condition 01/24/2013  . Abnormal PFTs 10/30/2012  . Allergic rhinitis 09/13/2012  . Aortic aneurysm of unspecified site without mention of rupture 10/27/2011  . Nocturia 10/27/2011  . Pain in left knee 08/04/2011  . Long term current use of anticoagulant therapy 03/17/2011  . Muscle weakness (generalized) 03/10/2011  . Atrial fibrillation ([place])  03/04/2011  . Fall 03/04/2011  . HTN (hypertension) 03/04/2011  . Hypothyroidism 03/04/2011  . Internal hemorrhoids without mention of complication 62/95/2841  . Cardiac pacemaker in situ- MDT 10/11 03/18/2010  . Basal cell carcinoma of skin of other and unspecified parts of face 12/24/2009  . Other specified cardiac dysrhythmias(427.89) 12/24/2009  . Unspecified sinusitis (chronic) 07/01/2009  . Major depressive disorder, single episode, unspecified ([place]) 03/05/2009  . Osteoarthrosis, unspecified whether generalized or localized, lower leg 01/19/2009  . Syncope and collapse 01/19/2009  . Obstructive sleep apnea (adult) (pediatric) 07/23/2008  . Unspecified vitamin D deficiency 10/18/2006  . Atherosclerosis of renal artery ([place]) 10/18/2006  . Pain in joint, shoulder region 10/18/2006  . Right bundle branch block 04/19/2006  . Urinary frequency 03/16/2006  . Spinal stenosis in cervical region 12/15/2004  . Type 2 diabetes mellitus with circulatory disorder ([place]) 11/24/2004  . Dyslipidemia 11/03/2004  . Gout 11/03/2004  . Coronary atherosclerosis 11/03/2004  . Enlarged prostate with lower urinary tract symptoms (LUTS) 04/28/2004  . Osteoarthritis 02/05/2003  . Impotence of organic origin 10/03/2000    Allergies  Allergen Reactions  . Caduet [Amlodipine-Atorvastatin] Other (See Comments)    Weakness   . Crestor [Rosuvastatin] Other (See Comments)    Muscle pain/weakness  . Lipitor [Atorvastatin Calcium] Other (See Comments)    Muscle pain/weakness  . Lisinopril Other (See Comments)    Doesn't remember reaction  . Quinolones     Risk of rupture of AAA  . Simvastatin Other (See Comments)    Muscle pain/weakness    Medications: Patient's Medications  New Prescriptions   No medications on file  Previous Medications   ACETAMINOPHEN (TYLENOL) 325 MG TABLET    Take 650 mg by mouth. Take one tablet every 6 hours as needed for pain   ALLOPURINOL (ZYLOPRIM) 100 MG TABLET    Take  50 mg by mouth daily.    AMOXICILLIN (AMOXIL) 500 MG CAPSULE    Take 500 mg by mouth. Take 4 caps prior to dential   APIXABAN (ELIQUIS) 2.5 MG TABS TABLET    Take 1 tablet (2.5 mg total) by mouth 2 (two) times daily.   CHOLECALCIFEROL (VITAMIN D) 400 UNITS CAPSULE    Take 400 Units by mouth 2 (two) times daily.    FUROSEMIDE (LASIX) 40 MG TABLET    Take 40 mg by mouth 2 (two) times daily.    IPRATROPIUM (ATROVENT) 0.06 % NASAL SPRAY    One puff in each nostril before each meal   LEVOTHYROXINE (SYNTHROID, LEVOTHROID) 100 MCG TABLET    Take 100 mcg by mouth daily before breakfast.   LORAZEPAM (  ATIVAN) 0.5 MG TABLET    Take 0.25 mg by mouth at bedtime.   LOSARTAN (COZAAR) 100 MG TABLET    Take 1 tablet (100 mg total) by mouth daily.   METOPROLOL (LOPRESSOR) 100 MG TABLET    Take 100 mg by mouth 2 (two) times daily.   NITROGLYCERIN (NITROSTAT) 0.4 MG SL TABLET    Place 0.4 mg under the tongue every 5 (five) minutes as needed for chest pain.    NUTRITIONAL SUPPLEMENTS (BOOST COMPACT) LIQD    Take by mouth. 237 mls daily   OLOPATADINE (PATANOL) 0.1 % OPHTHALMIC SOLUTION    One drop in each eye twice daily to help conjunctival irritation   POLYETHYLENE GLYCOL (MIRALAX / GLYCOLAX) PACKET    Take 17 g by mouth daily.   POLYVINYL ALCOHOL (LIQUIFILM TEARS) 1.4 % OPHTHALMIC SOLUTION    Place 1 drop into both eyes. One drop in both eyes twice daily as needed for dry eyes   TAMSULOSIN (FLOMAX) 0.4 MG CAPS CAPSULE    Take 0.4 mg by mouth daily.   WHEAT DEXTRIN (BENEFIBER PO)    Take by mouth daily.  Modified Medications   No medications on file  Discontinued Medications   No medications on file    Physical Exam: Filed Vitals:   12/24/14 1008  BP: 140/78  Pulse: 68  Temp: 98.7 F (37.1 C)  TempSrc: Tympanic  Resp: 18   There is no weight on file to calculate BMI.  Physical Exam  Constitutional: He is oriented to person, place, and time. He appears well-developed and well-nourished. No distress.    HENT:  Severe hearing loss. Bilateral aides. Right facial droop.   Eyes:  Corrective lenses. Increased exposure of the right cornea due to post CVA facial droop.  Neck: No JVD present. No tracheal deviation present. No thyromegaly present.  Cardiovascular: Normal rate and normal heart sounds.  Frequent extrasystoles are present.  No murmur heard. Pacemaker left upper chest  Pulmonary/Chest: Effort normal. He has no wheezes. He has rales.  dry rales posterior lung bases.   Abdominal: Soft. Bowel sounds are normal. He exhibits no distension and no mass. There is no tenderness.  Musculoskeletal: Normal range of motion. He exhibits edema. He exhibits no tenderness.  Unstable gait. Using 4 wheel walker and w/c. Weaker on the right side. Hx of the right total knee replacement. Trace edema BLE, R>L  Lymphadenopathy:    He has no cervical adenopathy.  Neurological: He is alert and oriented to person, place, and time. He displays normal reflexes. No cranial nerve deficit. He exhibits normal muscle tone. Coordination normal.  Slurred speech. Right side weakness. C/o left foot spasm   Skin: Skin is warm and dry. No rash noted. No erythema. No pallor.  Psychiatric: He has a normal mood and affect. His behavior is normal. Judgment and thought content normal.  Nursing note and vitals reviewed.   Labs reviewed: Basic Metabolic Panel:  Recent Labs  07/29/14 09/16/14 11/25/14  NA 138 136* 137  K 4.2 4.0 4.7  BUN 26* 22* 21  CREATININE 0.9 0.9 1.0    Liver Function Tests:  Recent Labs  05/30/14 09/16/14  AST 14 17  ALT 10 12  ALKPHOS 93 87    CBC:  Recent Labs  05/30/14 09/16/14 11/25/14  WBC 7.2 5.7 7.2  HGB 13.9 14.3 15.0  HCT 41 42 44  PLT 234 223 200    Lab Results  Component Value Date   TSH 2.96 09/16/2014  Lab Results  Component Value Date   HGBA1C 6.5* 08/29/2013   Lab Results  Component Value Date   CHOL 117 05/01/2013   HDL 30* 05/01/2013   LDLCALC 71  05/01/2013   TRIG 79 05/01/2013   CHOLHDL 3.9 05/01/2013    Significant Diagnostic Results since last visit: none  Patient Care Team: [name], MD as PCP - General (Internal Medicine) Jon [name] Pixie Casino, MD as Consulting Physician (Cardiology) Ardis Hughs, MD as Attending Physician (Urology)  Assessment/Plan Problem List Items Addressed This Visit    Atrial fibrillation ([place]) (Chronic)    Rate controlled, continue Metoprolol 152m bid. Eliquis for VTE risk reduction.       HTN (hypertension) (Chronic)    Controlled, continue Metoprolol 100 mg twice a day and Losartan 10108mdaily       Hypothyroidism (Chronic)    continue Levothyroxine 10039m TSH 4.551 06/27/14, 2.965 09/16/14      Chronic systolic congestive heart failure ([facility] (Chronic)    12/23/14 CXR no acute cardiac or pulmonary pathology, developing CHF decompensation, continue Furosemide 71m52md, 20mg19m, f/u Cardiology.       Gout    No flare ups. continue Allopurinol 50mg 50my.       Enlarged prostate with lower urinary tract symptoms (LUTS)    Baseline urinary frequency, continue Tamsulosin 0.4mg   60m    Family/ staff Communication: continue to observe.  Labs/tests ordered: CXR done 12/23/14, CBC done 12/24/14, BMP pending.   [facility] [id number]:  [phone] prompts after 5pm & weekends Office Phone:  [phone] Fax:  [phone]

## 2014-12-24 NOTE — Assessment & Plan Note (Signed)
Rate controlled, continue Metoprolol 100mg  bid. Eliquis for VTE risk reduction.

## 2014-12-24 NOTE — Assessment & Plan Note (Signed)
No flare ups. continue Allopurinol 50mg daily.  

## 2014-12-24 NOTE — Assessment & Plan Note (Signed)
continue Levothyroxine 166mcg, TSH 4.551 06/27/14, 2.965 09/16/14

## 2014-12-24 NOTE — Assessment & Plan Note (Signed)
12/23/14 CXR no acute cardiac or pulmonary pathology, developing CHF decompensation, continue Furosemide 40mg  bid, 20mg  prn, f/u Cardiology.

## 2014-12-25 ENCOUNTER — Encounter: Payer: Self-pay | Admitting: Internal Medicine

## 2014-12-25 ENCOUNTER — Other Ambulatory Visit: Payer: Self-pay | Admitting: Nurse Practitioner

## 2014-12-25 ENCOUNTER — Ambulatory Visit (INDEPENDENT_AMBULATORY_CARE_PROVIDER_SITE_OTHER): Payer: Medicare Other | Admitting: Internal Medicine

## 2014-12-25 VITALS — BP 119/67 | HR 65 | Ht 68.0 in | Wt 165.8 lb

## 2014-12-25 DIAGNOSIS — I255 Ischemic cardiomyopathy: Secondary | ICD-10-CM | POA: Diagnosis not present

## 2014-12-25 DIAGNOSIS — R0600 Dyspnea, unspecified: Secondary | ICD-10-CM | POA: Diagnosis not present

## 2014-12-25 DIAGNOSIS — E1122 Type 2 diabetes mellitus with diabetic chronic kidney disease: Secondary | ICD-10-CM

## 2014-12-25 DIAGNOSIS — N182 Chronic kidney disease, stage 2 (mild): Secondary | ICD-10-CM

## 2014-12-25 DIAGNOSIS — Z950 Presence of cardiac pacemaker: Secondary | ICD-10-CM | POA: Diagnosis not present

## 2014-12-25 DIAGNOSIS — I5022 Chronic systolic (congestive) heart failure: Secondary | ICD-10-CM | POA: Diagnosis not present

## 2014-12-25 NOTE — Progress Notes (Signed)
OFFICE NOTE  Chief Complaint:  Follow-up, swelling and weight gain  Primary Care Physician: Jon Dooms, MD  HPI:  Jon [name]  is a 81 yo male formerly followed by Dr. Rex [name] and recently seen by Jon Kicks, NP, for left lower extremity edema with discoloration of his toes, was beginning to be uncomfortable for him to walk. We did venous Dopplers. He was quite concerned about arterial blood supply. His mother ended up with bilateral amputations. Venous Dopplers showed a ruptured baker cyst, no DVT. He was instructed on this and since that time his symptoms have resolved completely. He is on Coumadin for paroxysmal A-fib which led to the discoloration in his toes. Additionally, he has a history of abdominal aortic aneurysm and because he was concerned about his arterial disease, if he had it, we did Dopplers.  He is here for the results of the Dopplers. Bilateral ABIs were 1.0, though he does have appearance of an occluded right anterior tibial artery and a left posterior tib demonstrated a short segment of occlusive disease with reconstitution at the ankle. His pulses have been 2+. He has no claudication symptoms. Additionally, the duplex of his abdominal aorta was slightly, minimally changed from his last one. Previously it was 3.95 x 3.57, now he is 3.7 x 4.0, essentially the same, very slight increase. He has no abdominal pain and no other complaints. He has really no complaints today, feels quite well. He is not aware of any tachycardias or palpitations. He has a Medtronic pacemaker which was interrogated today. This demonstrates a battery voltage of 3.0V.  His a-fib burden is 1.6% and he is on amiodarone.  Other history includes bypass grafting in 1989; vein graft was stented in 2008. Last stress test was 2012, low risk study. EF was 49%. Pacemaker was placed for paroxysmal A-fib and bradycardia, sick sinus syndrome in 2011. He is also on warfarin.   Jon [name] underwent PFTs on  10/22/2012. This showed a moderately severe restrictive limitation as well as moderately reduced diffusion capacity. Based on these findings I recommended discontinuing his amiodarone and he also stopped his pravastatin. Since that time he's noted that he feels quite a bit better including some minor improvement in his knee pain. He still feels like his left knee is bothering him and he is status post right TKR. 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 I did refer him to the [facility] pulmonary, but has not been contacted for appointment so far.  Since his last followup, Jon [name] was seen by Jon [name] for a device check in his device appeared to be working properly. He was having atrial fibrillation, but a little burden. He continued on warfarin which was generally therapeutic. Unfortunately, in December he had a thalamic stroke which resulted in some right facial droop and hemiparesis. That is improving , but slowly. He was then changed to Eliquis for anticoagulation. He was also started on Zetia, as he has a history of statin intolerance in the past.  An echo performed in the hospital demonstrated an EF of 40-45%, which is mildly reduced from previous studies. In addition, he has had a small amount of weight gain and lower extremity swelling which is noted over the past several weeks while in recovery.  Unfortunately, he suffered another stroke last month around the time that we plan to admit him for tikosyn induction. He is still trying to recover from that.   [name] appears euvolemic today. He does have a small amount of lower extremity swelling. His weight has been stable. He is reporting some epistaxis. He is concerned about the dose of his Eliquis. He is also reporting some urinary incontinence which is new. There is some flank pain and there is some possible concern for urinary tract infection however he is awaiting the results of a urinalysis.  Jon [name] was seen in the  office today as an add-on for worsening swelling and shortness of breath. I reviewed notes from [facility] which indicate in January because of excessive urination he requested to come off of his diuretics. He was taken off the diuretics but quickly developed swelling and worsening shortness of breath. A BNP was obtained which was greater than 300. He was restarted on his diuretics and had some improvement in his shortness of breath but still has persistent symptoms. He's also developed asymmetric right lower extremity edema. He underwent an in office Doppler ultrasound that was negative for DVT. Those results are communicated in the office notes, but no formal radiology report is available. It would be unlikely for him to have DVT given the fact that he's on Eliquis. He still has persistent right lower extremity edema. He reports shortness of breath, fatigue and difficulty speaking. He denies any chest pain.  He is also overdue for pacemaker check and that was performed in the office today under my supervision.  Jon [name] returns today in the office. He reports an improvement in his shortness of breath with an increased dose of Lasix. An echocardiogram was performed which shows preserved systolic function of the left ventricle however there is significant right heart dysfunction and severe dilatation of the right ventricle. This is likely the cause of his shortness of breath. He seems to respond and diaphoretic. He reports his fatigue is improving and is currently working with physical therapy.  I saw Jon [name] back today in the office. When I last saw him he had had some leg swelling and I recommended increasing his Lasix to 40 mg twice daily. Unfortunately due to problems with incontinence he did not make those changes. Over the past 3 months there is been about a 13 pound weight gain. He has significant leg swelling. This is likely due to some right heart failure symptoms which were suspected at  his last office visit. He does get a little more short of breath with exertion as well.  Jon [name] returns again today for close follow-up. He is now been taking Lasix 40 mg twice daily. His weight is actually down 3-4 pounds since his last office visit. He's been urinating quite a bit more. He did see Dr. Louis Meckel for a urology evaluation. His medicines were adjusted and although he was having some urinary retention now is having problems with incontinence. He appears to have had some improvement in his swelling and overall some mild improvement in shortness of breath however the other night was noted to be short of breath and hypoxic. He was placed on oxygen overnight. Is not clear whether he will need nocturnal oxygen however he does have a remote history of sleep apnea and has not used CPAP in some time. Labs in early October indicated elevated BNP of 350, but this is not been reassessed since diuresis.  PMHx:  Past Medical History  Diagnosis Date  . Hypertension   . Coronary artery disease   . Arthritis   . Cataract   . Anxiety   .  Blood transfusion without reported diagnosis   . Hyperlipidemia   . Heart murmur   . Allergy   . Thyroid disease   . PAF (paroxysmal atrial fibrillation) (HCC)     coumadin  . History of abdominal aortic aneurysm   . History of echocardiogram 12/2008    EF >55%; mild concentric LVH; mild MR; mild-mod TR; mild AV regurg;   . History of nuclear stress test 12/2010    lexiscan; low risk; compared to prior study, perfusion improved  . History of Doppler ultrasound 05/22/2012    LEAs; R anterior tibial artery appeared occluded; L posterior tibial shows short segment of occlusive ds  . History of Doppler ultrasound 05/22/2012    Abdominal Aortic Doppler; slight increase in fusiform aneurysm   . Stroke ([place])   . Adjustment disorder with mixed anxiety and depressed mood 04/18/2013    Post CVA depression and anxiety   . Abnormality of gait 04/18/2013  .  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 Increased exposure of the right cornea.   . Pruritic condition 01/24/2013  . Memory change 01/24/2013  . Abnormal PFTs 10/30/2012    Followed in Pulmonary clinic/ [facility]/ [place]  - PFT's 10/22/12 VC  55% no obst, DLCO 50%  - PFT's 12/20/2012 VC 83% and no obst, dLCO 55%  -11/08/2012  Walked RA x 3 laps @ 185 ft each stopped due to  End of study, not desat -11/08/12 esr 10    . Impotence of organic origin 10/03/2000  . Osteoarthrosis, unspecified whether generalized or localized, unspecified site 02/05/2003  . Hypertrophy of prostate with urinary obstruction and other lower urinary tract symptoms (LUTS) 04/28/2004  . Gout, unspecified 11/03/2004  . Acute on chronic systolic congestive heart failure, NYHA class 2 ([place]) 03/16/2006    BNP 376.9 05/28/13   . Atherosclerosis of renal artery ([place]) 10/18/2006  . Unspecified vitamin D deficiency 10/18/2006  . Obstructive sleep apnea (adult) (pediatric) 07/23/2008  . Major depressive disorder, single episode, unspecified ([place]) 03/05/2009  . Basal cell carcinoma of skin of other and unspecified parts of face 12/24/2009  . Internal hemorrhoids without mention of complication 49/20/1007  . Muscle weakness (generalized) 03/10/2011  . Pain in joint, lower leg 08/04/2011  . Nocturia 10/27/2011  . Aortic aneurysm of unspecified site without mention of rupture 10/27/2011  . Fall 03/04/2011  . Hyponatremia 03/04/2011  . TIA (transient ischemic attack) 04/30/2013  . Fatigue 04/18/2013  . 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 CVA - Rt brain stroke 12/14 02/18/2013    02/19/13 angiography CT head:  Diffuse atherosclerotic irregularity and plaque formation of the distal right common carotid artery and proximal internal carotid artery without significant stenosis. Plaque ulceration is present and is a source of emboli. A small right thalamic CVA    .  Dyslipidemia 11/03/2004  . Type II or unspecified type diabetes mellitus without mention of complication, not stated as uncontrolled 11/24/2004  . Spinal stenosis in cervical region 12/15/2004  . Right bundle branch block 04/19/2006  . Cardiac pacemaker in situ- MDT 10/11 03/18/2010    Medtronic revo implant in October 2011. Severe sinus bradycardia in the 30s, but not truly pacemaker dependent   . Long term (current) use of anticoagulants 03/17/2011  . Hypothyroidism 03/04/2011  . HTN (hypertension) 03/04/2011  . Atrial fibrillation ([place]) 03/04/2011     He was on coumadin until his stroke last December  and was switched to apixaban 2.$RemoveBefor'5mg'LYMLMOipyYUk$  daily which he has been taking faithfully without reported side effects.    . SSS (sick sinus syndrome) ([place]) 04/08/2014  . DM (diabetes mellitus), type 2 with renal complications ([place]) 3/71/6967  . CKD stage 2 due to type 2 diabetes mellitus ([place]) 07/03/2014  . Osteoarthritis of both knees 07/31/2014  . Quadriceps weakness 07/31/2014  . Balance disorder 07/31/2014  . Open wound of knee, leg (except thigh), and ankle, complicated 8/93/8101    Past Surgical History  Procedure Laterality Date  . Coronary artery bypass graft  1989  . Pacemaker insertion  2011  . Insert / replace / remove pacemaker    . Eye surgery    . Joint replacement    . Hernia repair    . Coronary angioplasty with stent placement  2008    stent to SVG to OM    FAMHx:  Family History  Problem Relation Age of Onset  . Diabetes Father   . Other Mother     PAD with amputations    SOCHx:   reports that he quit smoking about 47 years ago. His smoking use included Cigarettes. He has a 11.25 pack-year smoking history. He has never used smokeless tobacco. He reports that he does not drink alcohol or use illicit drugs.  ALLERGIES:  Allergies  Allergen Reactions  . Caduet [Amlodipine-Atorvastatin] Other (See Comments)    Weakness   . Crestor [Rosuvastatin] Other (See Comments)    Muscle  pain/weakness  . Lipitor [Atorvastatin Calcium] Other (See Comments)    Muscle pain/weakness  . Lisinopril Other (See Comments)    Doesn't remember reaction  . Quinolones     Risk of rupture of AAA  . Simvastatin Other (See Comments)    Muscle pain/weakness    ROS: A comprehensive review of systems was negative except for: Respiratory: positive for dyspnea on exertion Cardiovascular: positive for irregular heart beat and lower extremity edema  HOME MEDS: Current Outpatient Prescriptions  Medication Sig Dispense Refill  . acetaminophen (TYLENOL) 325 MG tablet Take 650 mg by mouth. Take one tablet every 6 hours as needed for pain    . allopurinol (ZYLOPRIM) 100 MG tablet Take 50 mg by mouth daily.     Marland Kitchen amoxicillin (AMOXIL) 500 MG capsule Take 500 mg by mouth. Take 4 caps prior to dential    . apixaban (ELIQUIS) 2.5 MG TABS tablet Take 1 tablet (2.5 mg total) by mouth 2 (two) times daily. 60 tablet 11  . Cholecalciferol (VITAMIN D) 400 UNITS capsule Take 400 Units by mouth 2 (two) times daily.     . furosemide (LASIX) 40 MG tablet Take 40 mg by mouth 2 (two) times daily.     Marland Kitchen ipratropium (ATROVENT) 0.06 % nasal spray One puff in each nostril before each meal 15 mL 12  . levothyroxine (SYNTHROID, LEVOTHROID) 100 MCG tablet Take 100 mcg by mouth daily before breakfast.    . LORazepam (ATIVAN) 0.5 MG tablet Take 0.25 mg by mouth at bedtime.    Marland Kitchen losartan (COZAAR) 100 MG tablet Take 1 tablet (100 mg total) by mouth daily. 30 tablet 5  . metoprolol (LOPRESSOR) 100 MG tablet Take 100 mg by mouth 2 (two) times daily.    . nitroGLYCERIN (NITROSTAT) 0.4 MG SL tablet Place 0.4 mg under the tongue every 5 (five) minutes as needed for chest pain.     . Nutritional Supplements (BOOST COMPACT) LIQD Take by mouth. 237 mls daily    .  olopatadine (PATANOL) 0.1 % ophthalmic solution One drop in each eye twice daily to help conjunctival irritation 5 mL 12  . polyethylene glycol (MIRALAX / GLYCOLAX)  packet Take 17 g by mouth daily.    . polyvinyl alcohol (LIQUIFILM TEARS) 1.4 % ophthalmic solution Place 1 drop into both eyes. One drop in both eyes twice daily as needed for dry eyes    . tamsulosin (FLOMAX) 0.4 MG CAPS capsule Take 0.4 mg by mouth daily.    . Wheat Dextrin (BENEFIBER PO) Take by mouth daily.     No current facility-administered medications for this visit.    LABS/IMAGING: Results for orders placed or performed in visit on 12/24/14 (from the past 48 hour(s))  Basic metabolic panel     Status: Abnormal   Collection Time: 12/24/14 12:00 AM  Result Value Ref Range   Glucose 111 mg/dL   BUN 27 (A) 4 - 21 mg/dL   Creatinine 0.9 0.6 - 1.3 mg/dL   Potassium 4.0 3.4 - 5.3 mmol/L   Sodium 138 137 - 147 mmol/L   No results found.  VITALS: BP 119/67 mmHg  Pulse 65  Ht $R'5\' 8"'qS$  (1.727 m)  Wt 165 lb 12.8 oz (75.206 kg)  BMI 25.22 kg/m2  EXAM: GEN: Awake, NAD HEENT: PERRLA, EOMI Neck: JVP elevated to 3 cm water Cardiovascular: Irregularly irregular Abdomen: Soft, nontender, mildly distended Extremities: 1+ bilateral pitting edema Skin: Warm, dry Neurologic: Grossly intact Psych: Pleasant  EKG: Deferred  ASSESSMENT: 1. Acute on chronic systolic, right sided- congestive heart failure, NYHA Class II symptoms - EF 55-60% 2. Persistent atrial fibrillation 3. Sick sinus syndrome status post pacemaker placement with normal function 4. Abnormal PFT's - off of amiodarone 5. History of abdominal aortic aneurysm with mild enlargement 6. Coronary artery disease status post CABG in 1989 with stent to the vein graft in 2008 7. Minimal PAD with normal ABIs bilaterally 8. Recent thalamic stroke - now on Eliquis 9. Epistaxis 10. Urinary incontinence  PLAN: 1.   Jon [name] may have had some improvement and certainly is losing some weight on twice a day diuretics. Unfortunately he's having some incontinence. He is currently seeing a urologist for this. I would encourage him to  stay on his current twice a day dosing and will go ahead and recheck a BNP. He is reporting some significant anxiety which attributes to his shortness of breath and I will deferred to Dr. Nyoka Cowden as he may need more liberal Xanax prescription. Also, he may need overnight oximetry to see if he qualifies for nocturnal oxygen. He does have a history of sleep apnea and was previously on CPAP, however was uncomfortable wearing a mask at night.  Plan to see him back in 3 months.  Pixie Casino, MD, [facility] Attending Cardiologist [place] 12/25/2014, 5:08 PM

## 2014-12-25 NOTE — Patient Instructions (Signed)
Dr. Debara Pickett has ordered lab work - BNP >> please have results sent to [phone]  Your physician recommends that you schedule a follow-up appointment in 3 months

## 2014-12-26 DIAGNOSIS — I10 Essential (primary) hypertension: Secondary | ICD-10-CM | POA: Diagnosis not present

## 2014-12-26 DIAGNOSIS — I509 Heart failure, unspecified: Secondary | ICD-10-CM | POA: Diagnosis not present

## 2015-01-01 ENCOUNTER — Encounter: Payer: Self-pay | Admitting: Internal Medicine

## 2015-01-05 ENCOUNTER — Telehealth: Payer: Self-pay | Admitting: *Deleted

## 2015-01-05 NOTE — Telephone Encounter (Signed)
Received BNP results = 300 (12/26/14) LM for daughter Izora Gala that value has improved and patient should continue current medications.

## 2015-01-06 ENCOUNTER — Encounter: Payer: Self-pay | Admitting: Internal Medicine

## 2015-01-07 ENCOUNTER — Encounter: Payer: Self-pay | Admitting: Internal Medicine

## 2015-01-15 ENCOUNTER — Encounter: Payer: Self-pay | Admitting: Nurse Practitioner

## 2015-01-15 ENCOUNTER — Encounter: Payer: Medicare Other | Admitting: Internal Medicine

## 2015-01-15 ENCOUNTER — Non-Acute Institutional Stay: Payer: Medicare Other | Admitting: Nurse Practitioner

## 2015-01-15 DIAGNOSIS — J80 Acute respiratory distress syndrome: Secondary | ICD-10-CM | POA: Diagnosis not present

## 2015-01-15 DIAGNOSIS — I480 Paroxysmal atrial fibrillation: Secondary | ICD-10-CM | POA: Diagnosis not present

## 2015-01-15 DIAGNOSIS — E039 Hypothyroidism, unspecified: Secondary | ICD-10-CM | POA: Diagnosis not present

## 2015-01-15 DIAGNOSIS — R531 Weakness: Secondary | ICD-10-CM | POA: Diagnosis not present

## 2015-01-15 DIAGNOSIS — I5022 Chronic systolic (congestive) heart failure: Secondary | ICD-10-CM | POA: Diagnosis not present

## 2015-01-15 DIAGNOSIS — M5440 Lumbago with sciatica, unspecified side: Secondary | ICD-10-CM

## 2015-01-15 DIAGNOSIS — R5383 Other fatigue: Secondary | ICD-10-CM | POA: Diagnosis not present

## 2015-01-15 DIAGNOSIS — I10 Essential (primary) hypertension: Secondary | ICD-10-CM | POA: Diagnosis not present

## 2015-01-15 DIAGNOSIS — R300 Dysuria: Secondary | ICD-10-CM | POA: Diagnosis not present

## 2015-01-15 NOTE — Assessment & Plan Note (Signed)
Compensated clinically, continue Furosemide 40mg , prn Furosemide. Update CMP

## 2015-01-15 NOTE — Assessment & Plan Note (Signed)
Tolerable, continue Tylenol 650mg  qam

## 2015-01-15 NOTE — Assessment & Plan Note (Addendum)
Controlled, continue Metoprolol 100 mg twice a day, Losartan 100mg , Furosemide 40mg  bid.

## 2015-01-15 NOTE — Assessment & Plan Note (Signed)
Rate controlled, continue Metoprolol 100mg bid. Eliquis for VTE risk reduction.  

## 2015-01-15 NOTE — Progress Notes (Signed)
Patient ID: Jon [name], male   DOB: [birth year], 81 y.o.   MRN: [id number]  Location:  [facility] Provider:  Marlana Latus NP  Code Status:  DNR Goals of care: Advanced Directive information    Chief Complaint  Patient presents with  . Medical Management of Chronic Issues  . Acute Visit    increased generalized weakness, slowed speech     HPI: Patient is a 81 y.o. male seen in the [facility] at [facility] today for evaluation of generalized weakness, slowed speech, duration uncertain, improving gradually, no new focal neurological symptoms, denied HA, dizziness, chest pain, palpitation, cough, wheezing, nausea, or dysuria.   CHF: less SOB, but still DOE, moist rales back of lungs, on Furosemide 40mg  bid.   Hypothyroidism, taking Levothyroxine 134mcg, last TSH wnl  BPH: no urinary retention, but dribbling, taking Tamsulosin 0.4mg  daily  Gout: no flare ups, taking Allopurinol 100mg   Afib/late effect of CVA: heart rate is in control, left facial weakness, ambulates with walker, taking Eliquis   Review of Systems  Constitutional: Positive for malaise/fatigue. Negative for fever.       Generalized weakness  HENT: Positive for hearing loss. Negative for congestion, ear discharge, ear pain and tinnitus.   Eyes: Negative.   Respiratory: Positive for shortness of breath. Negative for cough and wheezing.   Cardiovascular: Positive for leg swelling (sometimes, not apparent today. ). Negative for palpitations.       Trace edema BLE, R>L  Gastrointestinal: Negative for abdominal pain and diarrhea.       Chokes on swallowing since his CVA. Episodes of fecal incontinence. Hx of hemorrhoids.  Genitourinary: Positive for urgency and frequency.       Incontinent from time to time. Followed by Dr. Matilde Sprang.  Musculoskeletal: Positive for back pain. Negative for neck pain.  Skin: Negative for rash.  Neurological: Positive for weakness.       Thalamic CVA 02/18/2013. Residual right side  weakness, slurred speech, and depression. Recurrent CVA on 04/30/13 of the left MCA area Left facial weakness  Psychiatric/Behavioral: Positive for memory loss. Negative for depression. The patient is not nervous/anxious and does not have insomnia.     Past Medical History  Diagnosis Date  . Hypertension   . Coronary artery disease   . Arthritis   . Cataract   . Anxiety   . Blood transfusion without reported diagnosis   . Hyperlipidemia   . Heart murmur   . Allergy   . Thyroid disease   . PAF (paroxysmal atrial fibrillation) (HCC)     coumadin  . History of abdominal aortic aneurysm   . History of echocardiogram 12/2008    EF >55%; mild concentric LVH; mild MR; mild-mod TR; mild AV regurg;   . History of nuclear stress test 12/2010    lexiscan; low risk; compared to prior study, perfusion improved  . History of Doppler ultrasound 05/22/2012    LEAs; R anterior tibial artery appeared occluded; L posterior tibial shows short segment of occlusive ds  . History of Doppler ultrasound 05/22/2012    Abdominal Aortic Doppler; slight increase in fusiform aneurysm   . Stroke ([place])   . Adjustment disorder with mixed anxiety and depressed mood 04/18/2013    Post CVA depression and anxiety   . Abnormality of gait 04/18/2013  . 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 Increased exposure of the right cornea.   . Pruritic condition 01/24/2013  . Memory change 01/24/2013  . Abnormal  PFTs 10/30/2012    Followed in Pulmonary clinic/ [facility]/ [place]  - PFT's 10/22/12 VC  55% no obst, DLCO 50%  - PFT's 12/20/2012 VC 83% and no obst, dLCO 55%  -11/08/2012  Walked RA x 3 laps @ 185 ft each stopped due to  End of study, not desat -11/08/12 esr 10    . Impotence of organic origin 10/03/2000  . Osteoarthrosis, unspecified whether generalized or localized, unspecified site 02/05/2003  . Hypertrophy of prostate with urinary obstruction and other lower urinary tract symptoms (LUTS)  04/28/2004  . Gout, unspecified 11/03/2004  . Acute on chronic systolic congestive heart failure, NYHA class 2 ([place]) 03/16/2006    BNP 376.9 05/28/13   . Atherosclerosis of renal artery ([place]) 10/18/2006  . Unspecified vitamin D deficiency 10/18/2006  . Obstructive sleep apnea (adult) (pediatric) 07/23/2008  . Major depressive disorder, single episode, unspecified ([place]) 03/05/2009  . Basal cell carcinoma of skin of other and unspecified parts of face 12/24/2009  . Internal hemorrhoids without mention of complication 40/97/3532  . Muscle weakness (generalized) 03/10/2011  . Pain in joint, lower leg 08/04/2011  . Nocturia 10/27/2011  . Aortic aneurysm of unspecified site without mention of rupture 10/27/2011  . Fall 03/04/2011  . Hyponatremia 03/04/2011  . TIA (transient ischemic attack) 04/30/2013  . Fatigue 04/18/2013  . 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 CVA - Rt brain stroke 12/14 02/18/2013    02/19/13 angiography CT head:  Diffuse atherosclerotic irregularity and plaque formation of the distal right common carotid artery and proximal internal carotid artery without significant stenosis. Plaque ulceration is present and is a source of emboli. A small right thalamic CVA    . Dyslipidemia 11/03/2004  . Type II or unspecified type diabetes mellitus without mention of complication, not stated as uncontrolled 11/24/2004  . Spinal stenosis in cervical region 12/15/2004  . Right bundle branch block 04/19/2006  . Cardiac pacemaker in situ- MDT 10/11 03/18/2010    Medtronic revo implant in October 2011. Severe sinus bradycardia in the 30s, but not truly pacemaker dependent   . Long term (current) use of anticoagulants 03/17/2011  . Hypothyroidism 03/04/2011  . HTN (hypertension) 03/04/2011  . Atrial fibrillation ([place]) 03/04/2011     He was on coumadin until his stroke last December and was switched to apixaban 2.$RemoveBefor'5mg'jpCSMAcuEfLl$  daily which he has been taking  faithfully without reported side effects.    . SSS (sick sinus syndrome) ([place]) 04/08/2014  . DM (diabetes mellitus), type 2 with renal complications ([place]) 9/92/4268  . CKD stage 2 due to type 2 diabetes mellitus ([place]) 07/03/2014  . Osteoarthritis of both knees 07/31/2014  . Quadriceps weakness 07/31/2014  . Balance disorder 07/31/2014  . Open wound of knee, leg (except thigh), and ankle, complicated 3/41/9622    Patient Active Problem List   Diagnosis Date Noted  . Chalazion of right lower eyelid 10/24/2014  . Right heart failure ([place]) 09/03/2014  . Osteoarthritis of both knees 07/31/2014  . Quadriceps weakness 07/31/2014  . Balance disorder 07/31/2014  . Open wound of knee, leg (except thigh), and ankle, complicated 29/79/8921  . Dyspnea 07/03/2014  . DM (diabetes mellitus), type 2 with renal complications ([place]) 19/41/7408  . CKD stage 2 due to type 2 diabetes mellitus ([place]) 07/03/2014  . Lower back pain 05/29/2014  . SSS (sick sinus syndrome) ([place]) 04/08/2014  . Constipation 03/27/2014  . Soreness of tongue 03/27/2014  . Fecal  incontinence 02/17/2014  . Chronic systolic congestive heart failure (HCC) 02/17/2014  . Urgency of urination 02/13/2014  . Hemorrhoid 02/13/2014  . Cerebral infarction due to embolism of cerebral artery (HCC) 01/06/2014  . UTI (urinary tract infection) 12/26/2013  . GERD (gastroesophageal reflux disease) 10/24/2013  . Epistaxis 10/22/2013  . Dysphagia, unspecified(787.20) 10/16/2013  . Cough 09/12/2013  . Foot spasms 09/12/2013  . Rhinorrhea 08/15/2013  . Edema 06/01/2013  . TIA (transient ischemic attack) 04/30/2013  . History of ischemic left MCA stroke 04/30/2013  . 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 Abnormality of gait 04/18/2013  . Adjustment disorder with mixed anxiety and depressed mood 04/18/2013  . Spell of generalized weakness 04/18/2013  . 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 Xerophthalmia  02/25/2013  . CVA - Rt brain stroke 12/14 02/18/2013  . Memory change 01/24/2013  . Pruritic condition 01/24/2013  . Abnormal PFTs 10/30/2012  . Allergic rhinitis 09/13/2012  . Aortic aneurysm of unspecified site without mention of rupture 10/27/2011  . Nocturia 10/27/2011  . Pain in left knee 08/04/2011  . Long term current use of anticoagulant therapy 03/17/2011  . Muscle weakness (generalized) 03/10/2011  . Atrial fibrillation (HCC) 03/04/2011  . Fall 03/04/2011  . HTN (hypertension) 03/04/2011  . Hypothyroidism 03/04/2011  . Internal hemorrhoids without mention of complication 02/03/2011  . Cardiac pacemaker in situ- MDT 10/11 03/18/2010  . Basal cell carcinoma of skin of other and unspecified parts of face 12/24/2009  . Other specified cardiac dysrhythmias(427.89) 12/24/2009  . Unspecified sinusitis (chronic) 07/01/2009  . Major depressive disorder, single episode, unspecified (HCC) 03/05/2009  . Osteoarthrosis, unspecified whether generalized or localized, lower leg 01/19/2009  . Syncope and collapse 01/19/2009  . Obstructive sleep apnea (adult) (pediatric) 07/23/2008  . Unspecified vitamin D deficiency 10/18/2006  . Atherosclerosis of renal artery (HCC) 10/18/2006  . Pain in joint, shoulder region 10/18/2006  . Right bundle branch block 04/19/2006  . Urinary frequency 03/16/2006  . Spinal stenosis in cervical region 12/15/2004  . Type 2 diabetes mellitus with circulatory disorder (HCC) 11/24/2004  . Dyslipidemia 11/03/2004  . Gout 11/03/2004  . Coronary atherosclerosis 11/03/2004  . Enlarged prostate with lower urinary tract symptoms (LUTS) 04/28/2004  . Osteoarthritis 02/05/2003  . Impotence of organic origin 10/03/2000    Allergies  Allergen Reactions  . Caduet [Amlodipine-Atorvastatin] Other (See Comments)    Weakness   . Crestor [Rosuvastatin] Other (See Comments)    Muscle pain/weakness  . Lipitor [Atorvastatin Calcium] Other (See Comments)    Muscle  pain/weakness  . Lisinopril Other (See Comments)    Doesn't remember reaction  . Quinolones     Risk of rupture of AAA  . Simvastatin Other (See Comments)    Muscle pain/weakness    Medications: Patient's Medications  New Prescriptions   No medications on file  Previous Medications   ACETAMINOPHEN (TYLENOL) 325 MG TABLET    Take 650 mg by mouth. Take one tablet every 6 hours as needed for pain   ALLOPURINOL (ZYLOPRIM) 100 MG TABLET    Take 50 mg by mouth daily.    AMOXICILLIN (AMOXIL) 500 MG CAPSULE    Take 500 mg by mouth. Take 4 caps prior to dential   APIXABAN (ELIQUIS) 2.5 MG TABS TABLET    Take 1 tablet (2.5 mg total) by mouth 2 (two) times daily.   CHOLECALCIFEROL (VITAMIN D) 400 UNITS CAPSULE    Take 400 Units by mouth 2 (two) times daily.  FUROSEMIDE (LASIX) 40 MG TABLET    Take 40 mg by mouth 2 (two) times daily.    IPRATROPIUM (ATROVENT) 0.06 % NASAL SPRAY    One puff in each nostril before each meal   LEVOTHYROXINE (SYNTHROID, LEVOTHROID) 100 MCG TABLET    Take 100 mcg by mouth daily before breakfast.   LORAZEPAM (ATIVAN) 0.5 MG TABLET    Take 0.25 mg by mouth at bedtime.   LOSARTAN (COZAAR) 100 MG TABLET    Take 1 tablet (100 mg total) by mouth daily.   METOPROLOL (LOPRESSOR) 100 MG TABLET    Take 100 mg by mouth 2 (two) times daily.   NITROGLYCERIN (NITROSTAT) 0.4 MG SL TABLET    Place 0.4 mg under the tongue every 5 (five) minutes as needed for chest pain.    NUTRITIONAL SUPPLEMENTS (BOOST COMPACT) LIQD    Take by mouth. 237 mls daily   OLOPATADINE (PATANOL) 0.1 % OPHTHALMIC SOLUTION    One drop in each eye twice daily to help conjunctival irritation   POLYETHYLENE GLYCOL (MIRALAX / GLYCOLAX) PACKET    Take 17 g by mouth daily.   POLYVINYL ALCOHOL (LIQUIFILM TEARS) 1.4 % OPHTHALMIC SOLUTION    Place 1 drop into both eyes. One drop in both eyes twice daily as needed for dry eyes   TAMSULOSIN (FLOMAX) 0.4 MG CAPS CAPSULE    Take 0.4 mg by mouth daily.   WHEAT DEXTRIN  (BENEFIBER PO)    Take by mouth daily.  Modified Medications   No medications on file  Discontinued Medications   No medications on file    Physical Exam: Filed Vitals:   01/15/15 1239  BP: 126/82  Pulse: 66  Temp: 96.6 F (35.9 C)  TempSrc: Tympanic  Resp: 18   There is no weight on file to calculate BMI.  Physical Exam  Constitutional: He is oriented to person, place, and time. He appears well-developed and well-nourished. No distress.  HENT:  Severe hearing loss. Bilateral aides. Right facial droop.   Eyes:  Corrective lenses. Increased exposure of the right cornea due to post CVA facial droop. Irregular pupil on the left  Neck: No JVD present. No tracheal deviation present. No thyromegaly present.  Cardiovascular: Normal rate and normal heart sounds.  Frequent extrasystoles are present.  No murmur heard. Pacemaker left upper chest  Pulmonary/Chest: Effort normal. He has no wheezes. He has rales.  dry rales posterior lung bases.   Abdominal: Soft. Bowel sounds are normal. He exhibits no distension and no mass. There is no tenderness.  Musculoskeletal: Normal range of motion. He exhibits edema. He exhibits no tenderness.  Unstable gait. Using 4 wheel walker and w/c. Weaker on the right side. Hx of the right total knee replacement. Trace edema BLE, R>L  Lymphadenopathy:    He has no cervical adenopathy.  Neurological: He is alert and oriented to person, place, and time. He displays normal reflexes. No cranial nerve deficit. He exhibits normal muscle tone. Coordination normal.  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 Recurrent CVA on 04/30/13 of the left MCA area Left facial weakness  Skin: Skin is warm and dry. No rash noted. No erythema. No pallor.  Psychiatric: He has a normal mood and affect. His behavior is normal. Judgment and thought content normal.  Nursing note and vitals reviewed.   Labs reviewed: Basic Metabolic  Panel:  Recent Labs  11/25/14 12/09/14 12/24/14  NA 137 135* 138  K 4.7 4.0 4.0  BUN  21 24* 27*  CREATININE 1.0 1.0 0.9    Liver Function Tests:  Recent Labs  05/30/14 09/16/14  AST 14 17  ALT 10 12  ALKPHOS 93 87    CBC:  Recent Labs  05/30/14 09/16/14 11/25/14  WBC 7.2 5.7 7.2  HGB 13.9 14.3 15.0  HCT 41 42 44  PLT 234 223 200    Lab Results  Component Value Date   TSH 2.96 09/16/2014   Lab Results  Component Value Date   HGBA1C 6.5* 08/29/2013   Lab Results  Component Value Date   CHOL 117 05/01/2013   HDL 30* 05/01/2013   LDLCALC 71 05/01/2013   TRIG 79 05/01/2013   CHOLHDL 3.9 05/01/2013    Significant Diagnostic Results since last visit: none  Patient Care Team: [name], MD as PCP - General (Internal Medicine) [facility] Pixie Casino, MD as Consulting Physician (Cardiology) Ardis Hughs, MD as Attending Physician (Urology)  Assessment/Plan Problem List Items Addressed This Visit    Atrial fibrillation ([place]) (Chronic)    Rate controlled, continue Metoprolol 100mg  bid. Eliquis for VTE risk reduction.       HTN (hypertension) (Chronic)    Controlled, continue Metoprolol 100 mg twice a day, Losartan 100mg , Furosemide 40mg  bid.       Hypothyroidism - Primary (Chronic)    continue Levothyroxine 179mcg, TSH 4.551 06/27/14, 2.965 09/16/14, update TSH      Chronic systolic congestive heart failure (HCC) (Chronic)    Compensated clinically, continue Furosemide 40mg , prn Furosemide. Update CMP      Lower back pain (Chronic)    Tolerable, continue Tylenol 650mg  qam      Spell of generalized weakness    Generalized weakness than usual, slowed speech, duration uncertain, but he said its getting better upon my examination, no new focal neurological symptoms noted. Update CBC, CMP, TSH, UA C/S          Family/ staff Communication: continue to observe.  Labs/tests ordered: CBC, CMP, TSH, UA C/S  [facility] Johanan Skorupski  NP Geriatrics [facility] [address], [place] [address] On Call:  [phone] & follow prompts after 5pm & weekends Office Phone:  [phone] Office Fax:  [phone]

## 2015-01-15 NOTE — Assessment & Plan Note (Signed)
Generalized weakness than usual, slowed speech, duration uncertain, but he said its getting better upon my examination, no new focal neurological symptoms noted. Update CBC, CMP, TSH, UA C/S

## 2015-01-15 NOTE — Assessment & Plan Note (Signed)
continue Levothyroxine 172mcg, TSH 4.551 06/27/14, 2.965 09/16/14, update TSH

## 2015-01-16 LAB — BASIC METABOLIC PANEL
BUN: 25 mg/dL — AB (ref 4–21)
Creatinine: 1 mg/dL (ref 0.6–1.3)
Glucose: 88 mg/dL
Potassium: 3.8 mmol/L (ref 3.4–5.3)
SODIUM: 139 mmol/L (ref 137–147)

## 2015-01-16 LAB — HEPATIC FUNCTION PANEL
ALK PHOS: 104 U/L (ref 25–125)
ALT: 11 U/L (ref 10–40)
AST: 22 U/L (ref 14–40)
BILIRUBIN, TOTAL: 1.3 mg/dL

## 2015-01-16 LAB — CBC AND DIFFERENTIAL
HCT: 48 % (ref 41–53)
Hemoglobin: 16 g/dL (ref 13.5–17.5)
Platelets: 180 10*3/uL (ref 150–399)
WBC: 7.1 10^3/mL

## 2015-01-16 LAB — TSH: TSH: 7.2 u[IU]/mL — AB (ref 0.41–5.90)

## 2015-01-19 DIAGNOSIS — M1712 Unilateral primary osteoarthritis, left knee: Secondary | ICD-10-CM | POA: Diagnosis not present

## 2015-01-19 DIAGNOSIS — M25562 Pain in left knee: Secondary | ICD-10-CM | POA: Diagnosis not present

## 2015-01-22 ENCOUNTER — Non-Acute Institutional Stay: Payer: Medicare Other | Admitting: Nurse Practitioner

## 2015-01-22 ENCOUNTER — Encounter: Payer: Self-pay | Admitting: Nurse Practitioner

## 2015-01-22 ENCOUNTER — Other Ambulatory Visit: Payer: Self-pay | Admitting: Nurse Practitioner

## 2015-01-22 DIAGNOSIS — I482 Chronic atrial fibrillation: Secondary | ICD-10-CM | POA: Diagnosis not present

## 2015-01-22 DIAGNOSIS — E039 Hypothyroidism, unspecified: Secondary | ICD-10-CM

## 2015-01-22 DIAGNOSIS — N182 Chronic kidney disease, stage 2 (mild): Secondary | ICD-10-CM | POA: Diagnosis not present

## 2015-01-22 DIAGNOSIS — I10 Essential (primary) hypertension: Secondary | ICD-10-CM

## 2015-01-22 DIAGNOSIS — I5022 Chronic systolic (congestive) heart failure: Secondary | ICD-10-CM | POA: Diagnosis not present

## 2015-01-22 DIAGNOSIS — E1122 Type 2 diabetes mellitus with diabetic chronic kidney disease: Secondary | ICD-10-CM | POA: Diagnosis not present

## 2015-01-22 DIAGNOSIS — M1000 Idiopathic gout, unspecified site: Secondary | ICD-10-CM | POA: Diagnosis not present

## 2015-01-22 DIAGNOSIS — N401 Enlarged prostate with lower urinary tract symptoms: Secondary | ICD-10-CM

## 2015-01-22 DIAGNOSIS — I4821 Permanent atrial fibrillation: Secondary | ICD-10-CM

## 2015-01-22 NOTE — Progress Notes (Signed)
Patient ID: Jon [name], male   DOB: [birth year], 81 y.o.   MRN: [id number]  Location:  [facility] Provider:  Marlana Latus NP  Code Status:  DNR Goals of care: Advanced Directive information    Chief Complaint  Patient presents with  . Medical Management of Chronic Issues  . Acute Visit    elevated TSH     HPI: Patient is a 81 y.o. male seen in the [facility] at [facility] today for evaluation of elevated TSH, 7.202 01/16/15, presently on Levothyroxine 168mcg, TSH 2.965 09/16/14.   CHF: less SOB, but still DOE, moist rales back of lungs, on Furosemide 40mg  bid.   Hypothyroidism, taking Levothyroxine 110mcg, last TSH wnl  BPH: no urinary retention, but dribbling, taking Tamsulosin 0.4mg  daily  Gout: no flare ups, taking Allopurinol 100mg   Afib/late effect of CVA: heart rate is in control, left facial weakness, ambulates with walker, taking Eliquis   Review of Systems  Constitutional: Positive for malaise/fatigue. Negative for fever.       Generalized weakness  HENT: Positive for hearing loss. Negative for congestion, ear discharge, ear pain and tinnitus.   Eyes: Negative.   Respiratory: Positive for shortness of breath. Negative for cough and wheezing.   Cardiovascular: Positive for leg swelling (sometimes, not apparent today. ). Negative for palpitations.       Trace edema BLE, R>L  Gastrointestinal: Negative for abdominal pain and diarrhea.       Chokes on swallowing since his CVA. Episodes of fecal incontinence. Hx of hemorrhoids.  Genitourinary: Positive for urgency and frequency.       Incontinent from time to time. Followed by Dr. Matilde Sprang.  Musculoskeletal: Positive for back pain. Negative for neck pain.  Skin: Negative for rash.  Neurological: Positive for weakness.       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 Recurrent CVA on 04/30/13 of the left MCA area Left facial weakness  Psychiatric/Behavioral: Positive for  memory loss. Negative for depression. The patient is not nervous/anxious and does not have insomnia.     Past Medical History  Diagnosis Date  . Hypertension   . Coronary artery disease   . Arthritis   . Cataract   . Anxiety   . Blood transfusion without reported diagnosis   . Hyperlipidemia   . Heart murmur   . Allergy   . Thyroid disease   . PAF (paroxysmal atrial fibrillation) (HCC)     coumadin  . History of abdominal aortic aneurysm   . History of echocardiogram 12/2008    EF >55%; mild concentric LVH; mild MR; mild-mod TR; mild AV regurg;   . History of nuclear stress test 12/2010    lexiscan; low risk; compared to prior study, perfusion improved  . History of Doppler ultrasound 05/22/2012    LEAs; R anterior tibial artery appeared occluded; L posterior tibial shows short segment of occlusive ds  . History of Doppler ultrasound 05/22/2012    Abdominal Aortic Doppler; slight increase in fusiform aneurysm   . Stroke ([place])   . Adjustment disorder with mixed anxiety and depressed mood 04/18/2013    Post CVA depression and anxiety   . Abnormality of gait 04/18/2013  . 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 Increased exposure of the right cornea.   . Pruritic condition 01/24/2013  . Memory change 01/24/2013  . Abnormal PFTs 10/30/2012    Followed in Pulmonary clinic/ [facility]/ [place]  - PFT's 10/22/12  VC  55% no obst, DLCO 50%  - PFT's 12/20/2012 VC 83% and no obst, dLCO 55%  -11/08/2012  Walked RA x 3 laps @ 185 ft each stopped due to  End of study, not desat -11/08/12 esr 10    . Impotence of organic origin 10/03/2000  . Osteoarthrosis, unspecified whether generalized or localized, unspecified site 02/05/2003  . Hypertrophy of prostate with urinary obstruction and other lower urinary tract symptoms (LUTS) 04/28/2004  . Gout, unspecified 11/03/2004  . Acute on chronic systolic congestive heart failure, NYHA class 2 ([place]) 03/16/2006    BNP 376.9 05/28/13   .  Atherosclerosis of renal artery ([place]) 10/18/2006  . Unspecified vitamin D deficiency 10/18/2006  . Obstructive sleep apnea (adult) (pediatric) 07/23/2008  . Major depressive disorder, single episode, unspecified ([place]) 03/05/2009  . Basal cell carcinoma of skin of other and unspecified parts of face 12/24/2009  . Internal hemorrhoids without mention of complication 40/97/3532  . Muscle weakness (generalized) 03/10/2011  . Pain in joint, lower leg 08/04/2011  . Nocturia 10/27/2011  . Aortic aneurysm of unspecified site without mention of rupture 10/27/2011  . Fall 03/04/2011  . Hyponatremia 03/04/2011  . TIA (transient ischemic attack) 04/30/2013  . Fatigue 04/18/2013  . 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 CVA - Rt brain stroke 12/14 02/18/2013    02/19/13 angiography CT head:  Diffuse atherosclerotic irregularity and plaque formation of the distal right common carotid artery and proximal internal carotid artery without significant stenosis. Plaque ulceration is present and is a source of emboli. A small right thalamic CVA    . Dyslipidemia 11/03/2004  . Type II or unspecified type diabetes mellitus without mention of complication, not stated as uncontrolled 11/24/2004  . Spinal stenosis in cervical region 12/15/2004  . Right bundle branch block 04/19/2006  . Cardiac pacemaker in situ- MDT 10/11 03/18/2010    Medtronic revo implant in October 2011. Severe sinus bradycardia in the 30s, but not truly pacemaker dependent   . Long term (current) use of anticoagulants 03/17/2011  . Hypothyroidism 03/04/2011  . HTN (hypertension) 03/04/2011  . Atrial fibrillation ([place]) 03/04/2011     He was on coumadin until his stroke last December and was switched to apixaban 2.$RemoveBefor'5mg'mHyunlqrafqw$  daily which he has been taking faithfully without reported side effects.    . SSS (sick sinus syndrome) ([place]) 04/08/2014  . DM (diabetes mellitus), type 2 with renal complications ([place])  9/92/4268  . CKD stage 2 due to type 2 diabetes mellitus ([place]) 07/03/2014  . Osteoarthritis of both knees 07/31/2014  . Quadriceps weakness 07/31/2014  . Balance disorder 07/31/2014  . Open wound of knee, leg (except thigh), and ankle, complicated 3/41/9622    Patient Active Problem List   Diagnosis Date Noted  . Chalazion of right lower eyelid 10/24/2014  . Right heart failure ([place]) 09/03/2014  . Osteoarthritis of both knees 07/31/2014  . Quadriceps weakness 07/31/2014  . Balance disorder 07/31/2014  . Open wound of knee, leg (except thigh), and ankle, complicated 29/79/8921  . Dyspnea 07/03/2014  . DM (diabetes mellitus), type 2 with renal complications ([place]) 19/41/7408  . CKD stage 2 due to type 2 diabetes mellitus ([place]) 07/03/2014  . Lower back pain 05/29/2014  . SSS (sick sinus syndrome) ([place]) 04/08/2014  . Constipation 03/27/2014  . Soreness of tongue 03/27/2014  . Fecal incontinence 02/17/2014  . Chronic systolic congestive heart failure ([place]) 02/17/2014  . Urgency of urination  02/13/2014  . Hemorrhoid 02/13/2014  . Cerebral infarction due to embolism of cerebral artery ([place]) 01/06/2014  . UTI (urinary tract infection) 12/26/2013  . GERD (gastroesophageal reflux disease) 10/24/2013  . Epistaxis 10/22/2013  . Dysphagia, unspecified(787.20) 10/16/2013  . Cough 09/12/2013  . Foot spasms 09/12/2013  . Rhinorrhea 08/15/2013  . Edema 06/01/2013  . TIA (transient ischemic attack) 04/30/2013  . History of ischemic left MCA stroke 04/30/2013  . 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 Abnormality of gait 04/18/2013  . Adjustment disorder with mixed anxiety and depressed mood 04/18/2013  . Spell of generalized weakness 04/18/2013  . Depression due to stroke ([place]) 04/11/2013  . 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 CVA - Rt brain stroke 12/14 02/18/2013  . Memory change 01/24/2013  . Pruritic condition 01/24/2013  . Abnormal PFTs 10/30/2012  . Allergic  rhinitis 09/13/2012  . Aortic aneurysm of unspecified site without mention of rupture 10/27/2011  . Nocturia 10/27/2011  . Pain in left knee 08/04/2011  . Long term current use of anticoagulant therapy 03/17/2011  . Muscle weakness (generalized) 03/10/2011  . Atrial fibrillation ([place]) 03/04/2011  . Fall 03/04/2011  . HTN (hypertension) 03/04/2011  . Hypothyroidism 03/04/2011  . Internal hemorrhoids without mention of complication 93/57/0177  . Cardiac pacemaker in situ- MDT 10/11 03/18/2010  . Basal cell carcinoma of skin of other and unspecified parts of face 12/24/2009  . Other specified cardiac dysrhythmias(427.89) 12/24/2009  . Unspecified sinusitis (chronic) 07/01/2009  . Major depressive disorder, single episode, unspecified ([place]) 03/05/2009  . Osteoarthrosis, unspecified whether generalized or localized, lower leg 01/19/2009  . Syncope and collapse 01/19/2009  . Obstructive sleep apnea (adult) (pediatric) 07/23/2008  . Unspecified vitamin D deficiency 10/18/2006  . Atherosclerosis of renal artery ([place]) 10/18/2006  . Pain in joint, shoulder region 10/18/2006  . Right bundle branch block 04/19/2006  . Urinary frequency 03/16/2006  . Spinal stenosis in cervical region 12/15/2004  . Type 2 diabetes mellitus with circulatory disorder ([place]) 11/24/2004  . Dyslipidemia 11/03/2004  . Gout 11/03/2004  . Coronary atherosclerosis 11/03/2004  . Enlarged prostate with lower urinary tract symptoms (LUTS) 04/28/2004  . Osteoarthritis 02/05/2003  . Impotence of organic origin 10/03/2000    Allergies  Allergen Reactions  . Caduet [Amlodipine-Atorvastatin] Other (See Comments)    Weakness   . Crestor [Rosuvastatin] Other (See Comments)    Muscle pain/weakness  . Lipitor [Atorvastatin Calcium] Other (See Comments)    Muscle pain/weakness  . Lisinopril Other (See Comments)    Doesn't remember reaction  . Quinolones     Risk of rupture of AAA  . Simvastatin Other (See Comments)     Muscle pain/weakness    Medications: Patient's Medications  New Prescriptions   No medications on file  Previous Medications   ACETAMINOPHEN (TYLENOL) 325 MG TABLET    Take 650 mg by mouth. Take one tablet every 6 hours as needed for pain   ALLOPURINOL (ZYLOPRIM) 100 MG TABLET    Take 50 mg by mouth daily.    AMOXICILLIN (AMOXIL) 500 MG CAPSULE    Take 500 mg by mouth. Take 4 caps prior to dential   APIXABAN (ELIQUIS) 2.5 MG TABS TABLET    Take 1 tablet (2.5 mg total) by mouth 2 (two) times daily.   CHOLECALCIFEROL (VITAMIN D) 400 UNITS CAPSULE    Take 400 Units by mouth 2 (two) times daily.    FUROSEMIDE (LASIX) 40 MG TABLET    Take 40 mg by mouth 2 (  two) times daily.    IPRATROPIUM (ATROVENT) 0.06 % NASAL SPRAY    One puff in each nostril before each meal   LEVOTHYROXINE (SYNTHROID, LEVOTHROID) 100 MCG TABLET    Take 100 mcg by mouth daily before breakfast.   LORAZEPAM (ATIVAN) 0.5 MG TABLET    Take 0.25 mg by mouth at bedtime.   LOSARTAN (COZAAR) 100 MG TABLET    Take 1 tablet (100 mg total) by mouth daily.   METOPROLOL (LOPRESSOR) 100 MG TABLET    Take 100 mg by mouth 2 (two) times daily.   NITROGLYCERIN (NITROSTAT) 0.4 MG SL TABLET    Place 0.4 mg under the tongue every 5 (five) minutes as needed for chest pain.    NUTRITIONAL SUPPLEMENTS (BOOST COMPACT) LIQD    Take by mouth. 237 mls daily   OLOPATADINE (PATANOL) 0.1 % OPHTHALMIC SOLUTION    One drop in each eye twice daily to help conjunctival irritation   POLYETHYLENE GLYCOL (MIRALAX / GLYCOLAX) PACKET    Take 17 g by mouth daily.   POLYVINYL ALCOHOL (LIQUIFILM TEARS) 1.4 % OPHTHALMIC SOLUTION    Place 1 drop into both eyes. One drop in both eyes twice daily as needed for dry eyes   TAMSULOSIN (FLOMAX) 0.4 MG CAPS CAPSULE    Take 0.4 mg by mouth daily.   WHEAT DEXTRIN (BENEFIBER PO)    Take by mouth daily.  Modified Medications   No medications on file  Discontinued Medications   No medications on file    Physical Exam: Filed  Vitals:   01/22/15 1058  BP: 120/72  Pulse: 66  Temp: 97 F (36.1 C)  TempSrc: Tympanic  Resp: 18   There is no weight on file to calculate BMI.  Physical Exam  Constitutional: He is oriented to person, place, and time. He appears well-developed and well-nourished. No distress.  HENT:  Severe hearing loss. Bilateral aides. Right facial droop.   Eyes:  Corrective lenses. Increased exposure of the right cornea due to post CVA facial droop. Irregular pupil on the left  Neck: No JVD present. No tracheal deviation present. No thyromegaly present.  Cardiovascular: Normal rate and normal heart sounds.  Frequent extrasystoles are present.  No murmur heard. Pacemaker left upper chest  Pulmonary/Chest: Effort normal. He has no wheezes. He has rales.  dry rales posterior lung bases.   Abdominal: Soft. Bowel sounds are normal. He exhibits no distension and no mass. There is no tenderness.  Musculoskeletal: Normal range of motion. He exhibits edema. He exhibits no tenderness.  Unstable gait. Using 4 wheel walker and w/c. Weaker on the right side. Hx of the right total knee replacement. Trace edema BLE, R>L  Lymphadenopathy:    He has no cervical adenopathy.  Neurological: He is alert and oriented to person, place, and time. He displays normal reflexes. No cranial nerve deficit. He exhibits normal muscle tone. Coordination normal.  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 Recurrent CVA on 04/30/13 of the left MCA area Left facial weakness  Skin: Skin is warm and dry. No rash noted. No erythema. No pallor.  Psychiatric: He has a normal mood and affect. His behavior is normal. Judgment and thought content normal.  Nursing note and vitals reviewed.   Labs reviewed: Basic Metabolic Panel:  Recent Labs  12/09/14 12/24/14 01/16/15  NA 135* 138 139  K 4.0 4.0 3.8  BUN 24* 27* 25*  CREATININE 1.0 0.9 1.0    Liver Function Tests:  Recent Labs   05/30/14 09/16/14 01/16/15  AST 14 17 22   ALT 10 12 11   ALKPHOS 93 87 104    CBC:  Recent Labs  09/16/14 11/25/14 01/16/15  WBC 5.7 7.2 7.1  HGB 14.3 15.0 16.0  HCT 42 44 48  PLT 223 200 180    Lab Results  Component Value Date   TSH 7.20* 01/16/2015   Lab Results  Component Value Date   HGBA1C 6.5* 08/29/2013   Lab Results  Component Value Date   CHOL 117 05/01/2013   HDL 30* 05/01/2013   LDLCALC 71 05/01/2013   TRIG 79 05/01/2013   CHOLHDL 3.9 05/01/2013    Significant Diagnostic Results since last visit: none  Patient Care Team: [name], MD as PCP - General (Internal Medicine) [facility] Pixie Casino, MD as Consulting Physician (Cardiology) Ardis Hughs, MD as Attending Physician (Urology)  Assessment/Plan Problem List Items Addressed This Visit    Atrial fibrillation ([place]) (Chronic)    Rate controlled, continue Metoprolol 100mg  bid. Eliquis for VTE risk reduction.       HTN (hypertension) (Chronic)    Controlled, continue Metoprolol 100 mg twice a day, Losartan 100mg , Furosemide 40mg  bid.       Hypothyroidism - Primary (Chronic)    09/16/14 TSH 2.965 01/15/15 7.202 Increase Levothyroxine 138mcg, TSH 8 weeks.       Chronic systolic congestive heart failure (HCC) (Chronic)    compensated clinically, continue Furosemide 40mg , prn Furosemide.      CKD stage 2 due to type 2 diabetes mellitus (HCC) (Chronic)    12/09/14 Bun/creat 24/0.95 01/16/15 Bun/creat 25/0.97      Gout    No flare ups. continue Allopurinol 50mg  daily.       Enlarged prostate with lower urinary tract symptoms (LUTS)    Baseline urinary frequency, continue Tamsulosin 0.4mg           Family/ staff Communication: continue to observe.  Labs/tests ordered: TSH 8 weeks.   [facility] Nechuma Boven NP Geriatrics [facility] [phone], [place] [address] On Call:  [phone] & follow prompts after 5pm &  weekends Office Phone:  [phone] Office Fax:  [phone]

## 2015-01-22 NOTE — Assessment & Plan Note (Signed)
compensated clinically, continue Furosemide 40mg , prn Furosemide.

## 2015-01-22 NOTE — Assessment & Plan Note (Signed)
No flare ups. continue Allopurinol 50mg daily.  

## 2015-01-22 NOTE — Assessment & Plan Note (Signed)
Rate controlled, continue Metoprolol 100mg bid. Eliquis for VTE risk reduction.  

## 2015-01-22 NOTE — Assessment & Plan Note (Signed)
Controlled, continue Metoprolol 100 mg twice a day, Losartan 100mg , Furosemide 40mg  bid.

## 2015-01-22 NOTE — Assessment & Plan Note (Signed)
09/16/14 TSH 2.965 01/15/15 7.202 Increase Levothyroxine 180mcg, TSH 8 weeks.

## 2015-01-22 NOTE — Assessment & Plan Note (Signed)
Baseline urinary frequency, continue Tamsulosin 0.4mg 

## 2015-01-22 NOTE — Assessment & Plan Note (Signed)
12/09/14 Bun/creat 24/0.95 01/16/15 Bun/creat 25/0.97

## 2015-01-27 DIAGNOSIS — M1712 Unilateral primary osteoarthritis, left knee: Secondary | ICD-10-CM | POA: Diagnosis not present

## 2015-01-27 DIAGNOSIS — M25562 Pain in left knee: Secondary | ICD-10-CM | POA: Diagnosis not present

## 2015-01-28 ENCOUNTER — Ambulatory Visit (INDEPENDENT_AMBULATORY_CARE_PROVIDER_SITE_OTHER): Payer: Medicare Other | Admitting: *Deleted

## 2015-01-28 ENCOUNTER — Telehealth: Payer: Self-pay | Admitting: Cardiology

## 2015-01-28 DIAGNOSIS — I495 Sick sinus syndrome: Secondary | ICD-10-CM | POA: Diagnosis not present

## 2015-01-28 NOTE — Telephone Encounter (Signed)
Spoke with pt and reminded pt of remote transmission that is due today. Pt verbalized understanding.   

## 2015-02-02 DIAGNOSIS — M25562 Pain in left knee: Secondary | ICD-10-CM | POA: Diagnosis not present

## 2015-02-02 DIAGNOSIS — M1712 Unilateral primary osteoarthritis, left knee: Secondary | ICD-10-CM | POA: Diagnosis not present

## 2015-02-02 NOTE — Progress Notes (Signed)
Remote pacemaker transmission.   

## 2015-02-07 ENCOUNTER — Emergency Department (HOSPITAL_COMMUNITY): Payer: Medicare Other

## 2015-02-07 ENCOUNTER — Encounter (HOSPITAL_COMMUNITY): Payer: Self-pay | Admitting: Emergency Medicine

## 2015-02-07 ENCOUNTER — Emergency Department (HOSPITAL_COMMUNITY)
Admission: EM | Admit: 2015-02-07 | Discharge: 2015-02-07 | Disposition: A | Discharge status: Home / Self Care | Payer: Medicare Other | Attending: Emergency Medicine | Admitting: Emergency Medicine

## 2015-02-07 DIAGNOSIS — F419 Anxiety disorder, unspecified: Secondary | ICD-10-CM | POA: Diagnosis not present

## 2015-02-07 DIAGNOSIS — Z7902 Long term (current) use of antithrombotics/antiplatelets: Secondary | ICD-10-CM | POA: Insufficient documentation

## 2015-02-07 DIAGNOSIS — S0083XA Contusion of other part of head, initial encounter: Secondary | ICD-10-CM | POA: Insufficient documentation

## 2015-02-07 DIAGNOSIS — Y9389 Activity, other specified: Secondary | ICD-10-CM | POA: Insufficient documentation

## 2015-02-07 DIAGNOSIS — W01198A Fall on same level from slipping, tripping and stumbling with subsequent striking against other object, initial encounter: Secondary | ICD-10-CM | POA: Insufficient documentation

## 2015-02-07 DIAGNOSIS — S0990XA Unspecified injury of head, initial encounter: Secondary | ICD-10-CM

## 2015-02-07 DIAGNOSIS — R011 Cardiac murmur, unspecified: Secondary | ICD-10-CM | POA: Insufficient documentation

## 2015-02-07 DIAGNOSIS — I4891 Unspecified atrial fibrillation: Secondary | ICD-10-CM | POA: Diagnosis not present

## 2015-02-07 DIAGNOSIS — I129 Hypertensive chronic kidney disease with stage 1 through stage 4 chronic kidney disease, or unspecified chronic kidney disease: Secondary | ICD-10-CM | POA: Diagnosis not present

## 2015-02-07 DIAGNOSIS — E1122 Type 2 diabetes mellitus with diabetic chronic kidney disease: Secondary | ICD-10-CM | POA: Insufficient documentation

## 2015-02-07 DIAGNOSIS — E039 Hypothyroidism, unspecified: Secondary | ICD-10-CM | POA: Insufficient documentation

## 2015-02-07 DIAGNOSIS — Z951 Presence of aortocoronary bypass graft: Secondary | ICD-10-CM | POA: Insufficient documentation

## 2015-02-07 DIAGNOSIS — M109 Gout, unspecified: Secondary | ICD-10-CM | POA: Diagnosis not present

## 2015-02-07 DIAGNOSIS — Y998 Other external cause status: Secondary | ICD-10-CM | POA: Insufficient documentation

## 2015-02-07 DIAGNOSIS — M1990 Unspecified osteoarthritis, unspecified site: Secondary | ICD-10-CM | POA: Diagnosis not present

## 2015-02-07 DIAGNOSIS — Z950 Presence of cardiac pacemaker: Secondary | ICD-10-CM | POA: Insufficient documentation

## 2015-02-07 DIAGNOSIS — Z79899 Other long term (current) drug therapy: Secondary | ICD-10-CM | POA: Insufficient documentation

## 2015-02-07 DIAGNOSIS — Z85828 Personal history of other malignant neoplasm of skin: Secondary | ICD-10-CM | POA: Diagnosis not present

## 2015-02-07 DIAGNOSIS — Z8673 Personal history of transient ischemic attack (TIA), and cerebral infarction without residual deficits: Secondary | ICD-10-CM | POA: Diagnosis not present

## 2015-02-07 DIAGNOSIS — Z87891 Personal history of nicotine dependence: Secondary | ICD-10-CM | POA: Insufficient documentation

## 2015-02-07 DIAGNOSIS — S098XXA Other specified injuries of head, initial encounter: Secondary | ICD-10-CM | POA: Diagnosis not present

## 2015-02-07 DIAGNOSIS — Z9861 Coronary angioplasty status: Secondary | ICD-10-CM | POA: Insufficient documentation

## 2015-02-07 DIAGNOSIS — N182 Chronic kidney disease, stage 2 (mild): Secondary | ICD-10-CM | POA: Diagnosis not present

## 2015-02-07 DIAGNOSIS — S0093XA Contusion of unspecified part of head, initial encounter: Secondary | ICD-10-CM | POA: Diagnosis not present

## 2015-02-07 DIAGNOSIS — Y9289 Other specified places as the place of occurrence of the external cause: Secondary | ICD-10-CM | POA: Insufficient documentation

## 2015-02-07 DIAGNOSIS — M799 Soft tissue disorder, unspecified: Secondary | ICD-10-CM | POA: Diagnosis not present

## 2015-02-07 NOTE — ED Notes (Signed)
Pt given water to drink. 

## 2015-02-07 NOTE — ED Notes (Signed)
Pt verbalized understanding of d/c instructions and has no further questions. Pt stable and NAD. No signs of concussion. Pt to be d/c home with ptar.

## 2015-02-07 NOTE — ED Provider Notes (Signed)
CSN: [id number]     Arrival date & time 02/07/15  1917 History   First MD Initiated Contact with Patient 02/07/15 1920     Chief Complaint  Patient presents with  . Fall     (Consider location/radiation/quality/duration/timing/severity/associated sxs/prior Treatment) Patient is a 81 y.o. male presenting with fall. The history is provided by the patient.  Fall This is a new problem. The current episode started less than 1 hour ago. The problem occurs rarely. The problem has not changed since onset.Associated symptoms include headaches. Pertinent negatives include no chest pain, no abdominal pain and no shortness of breath. Nothing aggravates the symptoms. Nothing relieves the symptoms. He has tried nothing for the symptoms. The treatment provided no relief.   81 yo M with a chief complaint of a fall. Patient was doing a puzzle and dropped a piece on the floor. Patient then reached over to try and grab bed and fell onto the floor. Struck the left side of his head. Patient denies loss of consciousness. Patient is on Eliquis for atrial fibrillation. Denies any difficulty with speech any weakness. Patient denies chest pain back pain neck pain abdominal pain.  Past Medical History  Diagnosis Date  . Hypertension   . Coronary artery disease   . Arthritis   . Cataract   . Anxiety   . Blood transfusion without reported diagnosis   . Hyperlipidemia   . Heart murmur   . Allergy   . Thyroid disease   . PAF (paroxysmal atrial fibrillation) (HCC)     coumadin  . History of abdominal aortic aneurysm   . History of echocardiogram 12/2008    EF >55%; mild concentric LVH; mild MR; mild-mod TR; mild AV regurg;   . History of nuclear stress test 12/2010    lexiscan; low risk; compared to prior study, perfusion improved  . History of Doppler ultrasound 05/22/2012    LEAs; R anterior tibial artery appeared occluded; L posterior tibial shows short segment of occlusive ds  . History of Doppler ultrasound  05/22/2012    Abdominal Aortic Doppler; slight increase in fusiform aneurysm   . Stroke ([place])   . Adjustment disorder with mixed anxiety and depressed mood 04/18/2013    Post CVA depression and anxiety   . Abnormality of gait 04/18/2013  . 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 Increased exposure of the right cornea.   . Pruritic condition 01/24/2013  . Memory change 01/24/2013  . Abnormal PFTs 10/30/2012    Followed in Pulmonary clinic/ [facility]/ [place]  - PFT's 10/22/12 VC  55% no obst, DLCO 50%  - PFT's 12/20/2012 VC 83% and no obst, dLCO 55%  -11/08/2012  Walked RA x 3 laps @ 185 ft each stopped due to  End of study, not desat -11/08/12 esr 10    . Impotence of organic origin 10/03/2000  . Osteoarthrosis, unspecified whether generalized or localized, unspecified site 02/05/2003  . Hypertrophy of prostate with urinary obstruction and other lower urinary tract symptoms (LUTS) 04/28/2004  . Gout, unspecified 11/03/2004  . Acute on chronic systolic congestive heart failure, NYHA class 2 ([place]) 03/16/2006    BNP 376.9 05/28/13   . Atherosclerosis of renal artery ([place]) 10/18/2006  . Unspecified vitamin D deficiency 10/18/2006  . Obstructive sleep apnea (adult) (pediatric) 07/23/2008  . Major depressive disorder, single episode, unspecified ([place]) 03/05/2009  . Basal cell carcinoma of skin of other and unspecified parts of face 12/24/2009  . Internal hemorrhoids without mention  of complication 16/96/7893  . Muscle weakness (generalized) 03/10/2011  . Pain in joint, lower leg 08/04/2011  . Nocturia 10/27/2011  . Aortic aneurysm of unspecified site without mention of rupture 10/27/2011  . Fall 03/04/2011  . Hyponatremia 03/04/2011  . TIA (transient ischemic attack) 04/30/2013  . Fatigue 04/18/2013  . 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 CVA - Rt brain stroke 12/14 02/18/2013    02/19/13 angiography CT head:  Diffuse  atherosclerotic irregularity and plaque formation of the distal right common carotid artery and proximal internal carotid artery without significant stenosis. Plaque ulceration is present and is a source of emboli. A small right thalamic CVA    . Dyslipidemia 11/03/2004  . Type II or unspecified type diabetes mellitus without mention of complication, not stated as uncontrolled 11/24/2004  . Spinal stenosis in cervical region 12/15/2004  . Right bundle branch block 04/19/2006  . Cardiac pacemaker in situ- MDT 10/11 03/18/2010    Medtronic revo implant in October 2011. Severe sinus bradycardia in the 30s, but not truly pacemaker dependent   . Long term (current) use of anticoagulants 03/17/2011  . Hypothyroidism 03/04/2011  . HTN (hypertension) 03/04/2011  . Atrial fibrillation ([place]) 03/04/2011     He was on coumadin until his stroke last December and was switched to apixaban 2.58m daily which he has been taking faithfully without reported side effects.    . SSS ([place]) [place] 04/08/2014  . DM (diabetes mellitus), type 2 with renal complications ([place] 48/12/1749 . CKD stage 2 due to type 2 diabetes mellitus ([facility] 07/03/2014  . Osteoarthritis of both knees 07/31/2014  . Quadriceps weakness 07/31/2014  . Balance disorder 07/31/2014  . Open wound of knee, leg (except thigh), and ankle, complicated 50/25/8527  Past Surgical History  Procedure Laterality Date  . Coronary artery bypass graft  1989  . Pacemaker insertion  2011  . Insert / replace / remove pacemaker    . Eye surgery    . Joint replacement    . Hernia repair    . Coronary angioplasty with stent placement  2008    stent to SVG to OM   Family History  Problem Relation Age of Onset  . Diabetes Father   . Other Mother     PAD with amputations   Social History  Substance Use Topics  . Smoking status: Former Smoker -- 0.75 packs/day for 15 years    Types: Cigarettes    Quit date: 03/04/1967  . Smokeless tobacco: Never Used   . Alcohol Use: No    Review of Systems  Constitutional: Negative for fever and chills.  HENT: Negative for congestion and facial swelling.   Eyes: Negative for discharge and visual disturbance.  Respiratory: Negative for shortness of breath.   Cardiovascular: Negative for chest pain and palpitations.  Gastrointestinal: Negative for vomiting, abdominal pain and diarrhea.  Musculoskeletal: Negative for myalgias and arthralgias.  Skin: Negative for color change and rash.  Neurological: Positive for headaches. Negative for tremors and syncope.  Psychiatric/Behavioral: Negative for confusion and dysphoric mood.      Allergies  Quinolones; Caduet; Crestor; Lipitor; Simvastatin; and Lisinopril  Home Medications   Prior to Admission medications   Medication Sig Start Date End Date Taking? Authorizing Provider  acetaminophen (TYLENOL) 325 MG tablet Take 650 mg by mouth every morning.    Yes Historical Provider, MD  allopurinol (ZYLOPRIM) 100 MG tablet Take 50 mg  by mouth daily.    Yes Historical Provider, MD  amoxicillin (AMOXIL) 500 MG capsule Take 500 mg by mouth. Take 4 caps prior to dental procedures   Yes Historical Provider, MD  apixaban (ELIQUIS) 2.5 MG TABS tablet Take 1 tablet (2.5 mg total) by mouth 2 (two) times daily. 10/22/13  Yes Pixie Casino, MD  carboxymethylcellulose (REFRESH PLUS) 0.5 % SOLN Place 1 drop into both eyes every evening.   Yes Historical Provider, MD  Cholecalciferol (VITAMIN D) 400 UNITS capsule Take 400 Units by mouth 2 (two) times daily.    Yes Historical Provider, MD  furosemide (LASIX) 20 MG tablet Take 20 mg by mouth daily as needed for fluid or edema (takes 67m if gain >3 lbs in a day, >5 lbs in a week).   Yes Historical Provider, MD  furosemide (LASIX) 40 MG tablet Take 40 mg by mouth 2 (two) times daily.    Yes Historical Provider, MD  ipratropium (ATROVENT) 0.06 % nasal spray One puff in each nostril before each meal Patient taking differently:  Place 1 spray into both nostrils daily as needed for rhinitis.  08/15/13  Yes AEstill Dooms MD  levothyroxine (SYNTHROID, LEVOTHROID) 125 MCG tablet Take 125 mcg by mouth daily before breakfast.   Yes Historical Provider, MD  LORazepam (ATIVAN) 0.5 MG tablet Take 0.25 mg by mouth at bedtime.   Yes Historical Provider, MD  losartan (COZAAR) 100 MG tablet Take 1 tablet (100 mg total) by mouth daily. 03/26/13  Yes Mahima PBubba Camp MD  metoprolol (LOPRESSOR) 100 MG tablet Take 100 mg by mouth 2 (two) times daily.   Yes Historical Provider, MD  nitroGLYCERIN (NITROSTAT) 0.4 MG SL tablet Place 0.4 mg under the tongue every 5 (five) minutes as needed for chest pain.    Yes Historical Provider, MD  Nutritional Supplements (BOOST COMPACT) LIQD Take 1 Container by mouth daily. 237 mls daily   Yes Historical Provider, MD  olopatadine (PATANOL) 0.1 % ophthalmic solution One drop in each eye twice daily to help conjunctival irritation 08/15/13  Yes AEstill Dooms MD  OXYGEN Inhale 2 L into the lungs daily.   Yes Historical Provider, MD  polyethylene glycol (MIRALAX / GLYCOLAX) packet Take 17 g by mouth daily.   Yes Historical Provider, MD  polyvinyl alcohol (LIQUIFILM TEARS) 1.4 % ophthalmic solution Place 1 drop into both eyes. One drop in both eyes twice daily as needed for dry eyes   Yes Historical Provider, MD  Wheat Dextrin (BENEFIBER PO) Take 15 mLs by mouth daily.    Yes Historical Provider, MD   BP 158/80 mmHg  Pulse 69  Temp(Src) 98.1 F (36.7 C) (Oral)  Resp 23  SpO2 95% Physical Exam  Constitutional: He is oriented to person, place, and time. He appears well-developed and well-nourished.  HENT:  Head: Normocephalic.  Left parietal hematoma.  Eyes: EOM are normal. Pupils are equal, round, and reactive to light.  Neck: Normal range of motion. Neck supple. No JVD present.  Cardiovascular: Normal rate and regular rhythm.  Exam reveals no gallop and no friction rub.   No murmur  heard. Pulmonary/Chest: No respiratory distress. He has no wheezes.  Abdominal: He exhibits no distension. There is no tenderness. There is no rebound and no guarding.  Musculoskeletal: Normal range of motion.  Neurological: He is alert and oriented to person, place, and time.  Skin: No rash noted. No pallor.  Psychiatric: He has a normal mood and affect. His behavior is normal.  Nursing note and vitals reviewed.   ED Course  Procedures (including critical care time) Labs Review Labs Reviewed - No data to display  Imaging Review Ct Head Wo Contrast  02/07/2015  CLINICAL DATA:  [place] and hit head. Scalp soft tissue swelling. Initial encounter. The patient takes Eliquis. No loss of consciousness or ongoing pain. EXAM: CT HEAD WITHOUT CONTRAST TECHNIQUE: Contiguous axial images were obtained from the base of the skull through the vertex without intravenous contrast. COMPARISON:  CT head without contrast 04/30/2013. FINDINGS: Moderate generalized atrophy and diffuse white matter hypoattenuation is similar to the prior exam. No acute parenchymal hemorrhage is present. Remote lacunar infarcts of the basal ganglia are stable. No acute cortical infarct is present. The insular ribbon is intact. The ventricles are proportionate to the degree of atrophy. No significant extra-axial fluid collection or hemorrhage is present. Left parietal scalp soft tissue swelling is present. There is no underlying fracture. Additional soft tissue swelling is present in the left occipital scalp. A small fluid level is present in the right maxillary sinus without fracture. There is wall thickening suggesting a history of chronic right maxillary sinusitis. The paranasal sinuses are otherwise clear. The mastoid air cells are clear. The calvarium is intact. Bilateral lens replacements are present. The globes and orbits are otherwise within normal limits. IMPRESSION: 1. Left parietal and occipital soft tissue swelling without  underlying fracture. 2. No acute intracranial abnormality or significant interval change. 3. Stable atrophy and white matter disease. 4. Remote lacunar infarcts of the basal gangliar stable. 5. Fluid level in the right maxillary sinus suggesting acute sinusitis. 6. Wall thickening of the right maxillary sinus suggesting a history of chronic right-sided sinusitis. Electronically Signed   By: San Morelle M.D.   On: 02/07/2015 20:53   I have personally reviewed and evaluated these images and lab results as part of my medical decision-making.   EKG Interpretation   Date/Time:  Saturday February 07 2015 19:31:29 EST Ventricular Rate:  63 PR Interval:    QRS Duration: 148 QT Interval:  470 QTC Calculation: 481 R Axis:   98 Text Interpretation:  Atrial fibrillation RBBB and LPFB t wave inverion in  v2, 3 resolved from prior Otherwise no significant change Confirmed by  Tyrel Lex MD, Quillian Quince ([id number]) on 02/07/2015 9:29:21 PM      MDM   Final diagnoses:  Head injury, initial encounter    81 yo M with a chief complaint of fall. Patient is on Eliquis therefore will obtain a CT scan. CT negative for acute intracranial bleed. Discussed with patient the possibility of delayed intracranial hemorrhage. He will follow with his PCP in one week.  11:18 PM:  I have discussed the diagnosis/risks/treatment options with the patient and family and believe the pt to be eligible for discharge home to follow-up with PCP. We also discussed returning to the ED immediately if new or worsening sx occur. We discussed the sx which are most concerning (e.g., sudden worsening pain, confusion, weakness, other stroke sx) that necessitate immediate return. Medications administered to the patient during their visit and any new prescriptions provided to the patient are listed below.  Medications given during this visit Medications - No data to display  Discharge Medication List as of 02/07/2015  9:30 PM      The  patient appears reasonably screen and/or stabilized for discharge and I doubt any other medical condition or other [facility] requiring further screening, evaluation, or treatment in the ED at this time prior to discharge.  Deno Etienne, DO 02/07/15 2318

## 2015-02-07 NOTE — ED Notes (Signed)
Also reports hitting head, no LOC. Skin tear to R wrist area

## 2015-02-07 NOTE — ED Notes (Signed)
Pt in EMS from [facility] at [place] after falling on floor trying to get from chair to walker. Does take thinners. Neuro intact, A/O X4. Denies pain

## 2015-02-07 NOTE — Discharge Instructions (Signed)
When your own blood thinning medicines you're at risk for a delayed bleed. If you have worsening headache Concussion, Adult A concussion, or closed-head injury, is a brain injury caused by a direct blow to the head or by a quick and sudden movement (jolt) of the head or neck. Concussions are usually not life-threatening. Even so, the effects of a concussion can be serious. If you have had a concussion before, you are more likely to experience concussion-like symptoms after a direct blow to the head.  CAUSES  Direct blow to the head, such as from running into another player during a soccer game, being hit in a fight, or hitting your head on a hard surface.  A jolt of the head or neck that causes the brain to move back and forth inside the skull, such as in a car crash. SIGNS AND SYMPTOMS The signs of a concussion can be hard to notice. Early on, they may be missed by you, family members, and health care providers. You may look fine but act or feel differently. Symptoms are usually temporary, but they may last for days, weeks, or even longer. Some symptoms may appear right away while others may not show up for hours or days. Every head injury is different. Symptoms include:  Mild to moderate headaches that will not go away.  A feeling of pressure inside your head.  Having more trouble than usual:  Learning or remembering things you have heard.  Answering questions.  Paying attention or concentrating.  Organizing daily tasks.  Making decisions and solving problems.  Slowness in thinking, acting or reacting, speaking, or reading.  Getting lost or being easily confused.  Feeling tired all the time or lacking energy (fatigued).  Feeling drowsy.  Sleep disturbances.  Sleeping more than usual.  Sleeping less than usual.  Trouble falling asleep.  Trouble sleeping (insomnia).  Loss of balance or feeling lightheaded or dizzy.  Nausea or vomiting.  Numbness or  tingling.  Increased sensitivity to:  Sounds.  Lights.  Distractions.  Vision problems or eyes that tire easily.  Diminished sense of taste or smell.  Ringing in the ears.  Mood changes such as feeling sad or anxious.  Becoming easily irritated or angry for little or no reason.  Lack of motivation.  Seeing or hearing things other people do not see or hear (hallucinations). DIAGNOSIS Your health care provider can usually diagnose a concussion based on a description of your injury and symptoms. He or she will ask whether you passed out (lost consciousness) and whether you are having trouble remembering events that happened right before and during your injury. Your evaluation might include:  A brain scan to look for signs of injury to the brain. Even if the test shows no injury, you may still have a concussion.  Blood tests to be sure other problems are not present. TREATMENT  Concussions are usually treated in an emergency department, in urgent care, or at a clinic. You may need to stay in the hospital overnight for further treatment.  Tell your health care provider if you are taking any medicines, including prescription medicines, over-the-counter medicines, and natural remedies. Some medicines, such as blood thinners (anticoagulants) and aspirin, may increase the chance of complications. Also tell your health care provider whether you have had alcohol or are taking illegal drugs. This information may affect treatment.  Your health care provider will send you home with important instructions to follow.  How fast you will recover from a concussion depends  on many factors. These factors include how severe your concussion is, what part of your brain was injured, your age, and how healthy you were before the concussion.  Most people with mild injuries recover fully. Recovery can take time. In general, recovery is slower in older persons. Also, persons who have had a concussion in  the past or have other medical problems may find that it takes longer to recover from their current injury. HOME CARE INSTRUCTIONS General Instructions  Carefully follow the directions your health care provider gave you.  Only take over-the-counter or prescription medicines for pain, discomfort, or fever as directed by your health care provider.  Take only those medicines that your health care provider has approved.  Do not drink alcohol until your health care provider says you are well enough to do so. Alcohol and certain other drugs may slow your recovery and can put you at risk of further injury.  If it is harder than usual to remember things, write them down.  If you are easily distracted, try to do one thing at a time. For example, do not try to watch TV while fixing dinner.  Talk with family members or close friends when making important decisions.  Keep all follow-up appointments. Repeated evaluation of your symptoms is recommended for your recovery.  Watch your symptoms and tell others to do the same. Complications sometimes occur after a concussion. Older adults with a brain injury may have a higher risk of serious complications, such as a blood clot on the brain.  Tell your teachers, school nurse, school counselor, coach, athletic trainer, or work Freight forwarder about your injury, symptoms, and restrictions. Tell them about what you can or cannot do. They should watch for:  Increased problems with attention or concentration.  Increased difficulty remembering or learning new information.  Increased time needed to complete tasks or assignments.  Increased irritability or decreased ability to cope with stress.  Increased symptoms.  Rest. Rest helps the brain to heal. Make sure you:  Get plenty of sleep at night. Avoid staying up late at night.  Keep the same bedtime hours on weekends and weekdays.  Rest during the day. Take daytime naps or rest breaks when you feel  tired.  Limit activities that require a lot of thought or concentration. These include:  Doing homework or job-related work.  Watching TV.  Working on the computer.  Avoid any situation where there is potential for another head injury (football, hockey, soccer, basketball, martial arts, downhill snow sports and horseback riding). Your condition will get worse every time you experience a concussion. You should avoid these activities until you are evaluated by the appropriate follow-up health care providers. Returning To Your Regular Activities You will need to return to your normal activities slowly, not all at once. You must give your body and brain enough time for recovery.  Do not return to sports or other athletic activities until your health care provider tells you it is safe to do so.  Ask your health care provider when you can drive, ride a bicycle, or operate heavy machinery. Your ability to react may be slower after a brain injury. Never do these activities if you are dizzy.  Ask your health care provider about when you can return to work or school. Preventing Another Concussion It is very important to avoid another brain injury, especially before you have recovered. In rare cases, another injury can lead to permanent brain damage, brain swelling, or death. The risk of  this is greatest during the first 7-10 days after a head injury. Avoid injuries by:  Wearing a seat belt when riding in a car.  Drinking alcohol only in moderation.  Wearing a helmet when biking, skiing, skateboarding, skating, or doing similar activities.  Avoiding activities that could lead to a second concussion, such as contact or recreational sports, until your health care provider says it is okay.  Taking safety measures in your home.  Remove clutter and tripping hazards from floors and stairways.  Use grab bars in bathrooms and handrails by stairs.  Place non-slip mats on floors and in  bathtubs.  Improve lighting in dim areas. SEEK MEDICAL CARE IF:  You have increased problems paying attention or concentrating.  You have increased difficulty remembering or learning new information.  You need more time to complete tasks or assignments than before.  You have increased irritability or decreased ability to cope with stress.  You have more symptoms than before. Seek medical care if you have any of the following symptoms for more than 2 weeks after your injury:  Lasting (chronic) headaches.  Dizziness or balance problems.  Nausea.  Vision problems.  Increased sensitivity to noise or light.  Depression or mood swings.  Anxiety or irritability.  Memory problems.  Difficulty concentrating or paying attention.  Sleep problems.  Feeling tired all the time. SEEK IMMEDIATE MEDICAL CARE IF:  You have severe or worsening headaches. These may be a sign of a blood clot in the brain.  You have weakness (even if only in one hand, leg, or part of the face).  You have numbness.  You have decreased coordination.  You vomit repeatedly.  You have increased sleepiness.  One pupil is larger than the other.  You have convulsions.  You have slurred speech.  You have increased confusion. This may be a sign of a blood clot in the brain.  You have increased restlessness, agitation, or irritability.  You are unable to recognize people or places.  You have neck pain.  It is difficult to wake you up.  You have unusual behavior changes.  You lose consciousness. MAKE SURE YOU:  Understand these instructions.  Will watch your condition.  Will get help right away if you are not doing well or get worse.   This information is not intended to replace advice given to you by your health care provider. Make sure you discuss any questions you have with your health care provider.   Document Released: 05/14/2003 Document Revised: 03/14/2014 Document Reviewed:  09/13/2012 Elsevier Interactive Patient Education [employer].

## 2015-02-08 IMAGING — CR DG CHEST 2V
2 series · 2 of 2 positions shown · non-contrast
Comparison: None.

CLINICAL DATA: Short of breath

CHEST - 2 VIEW

[PA]
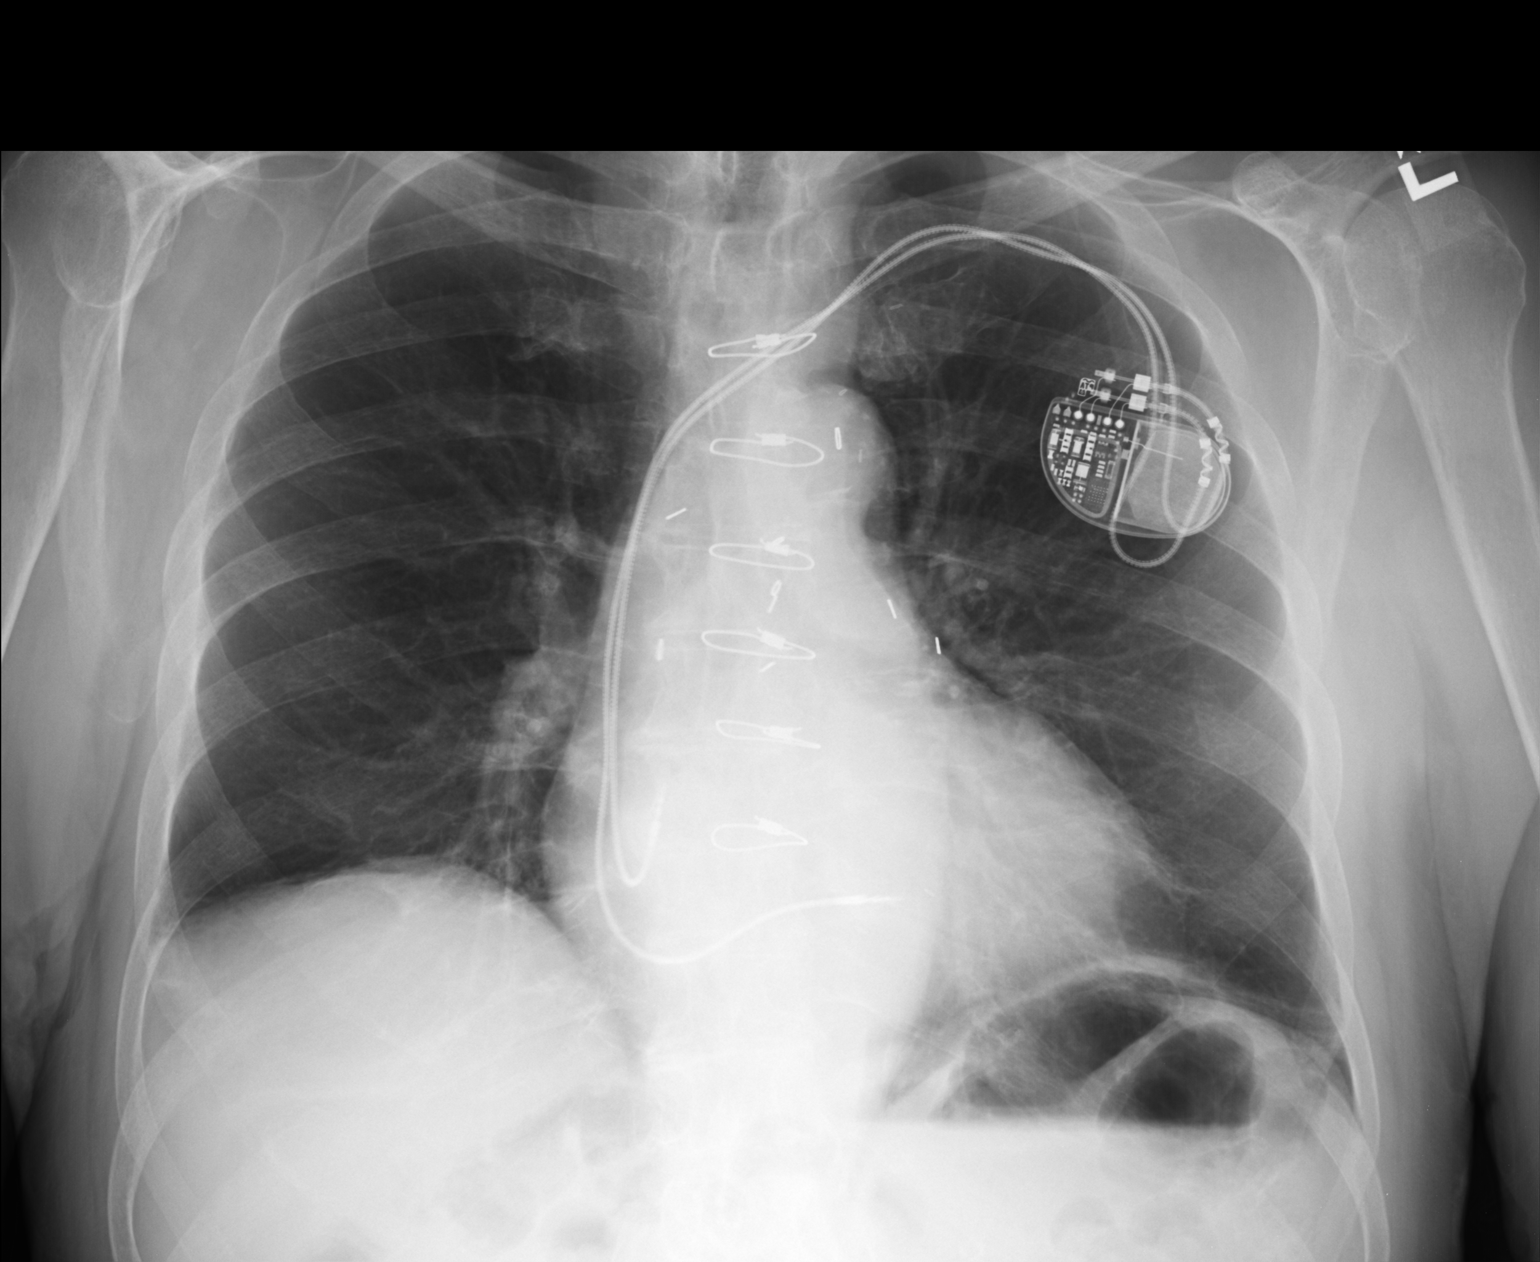

[lateral]
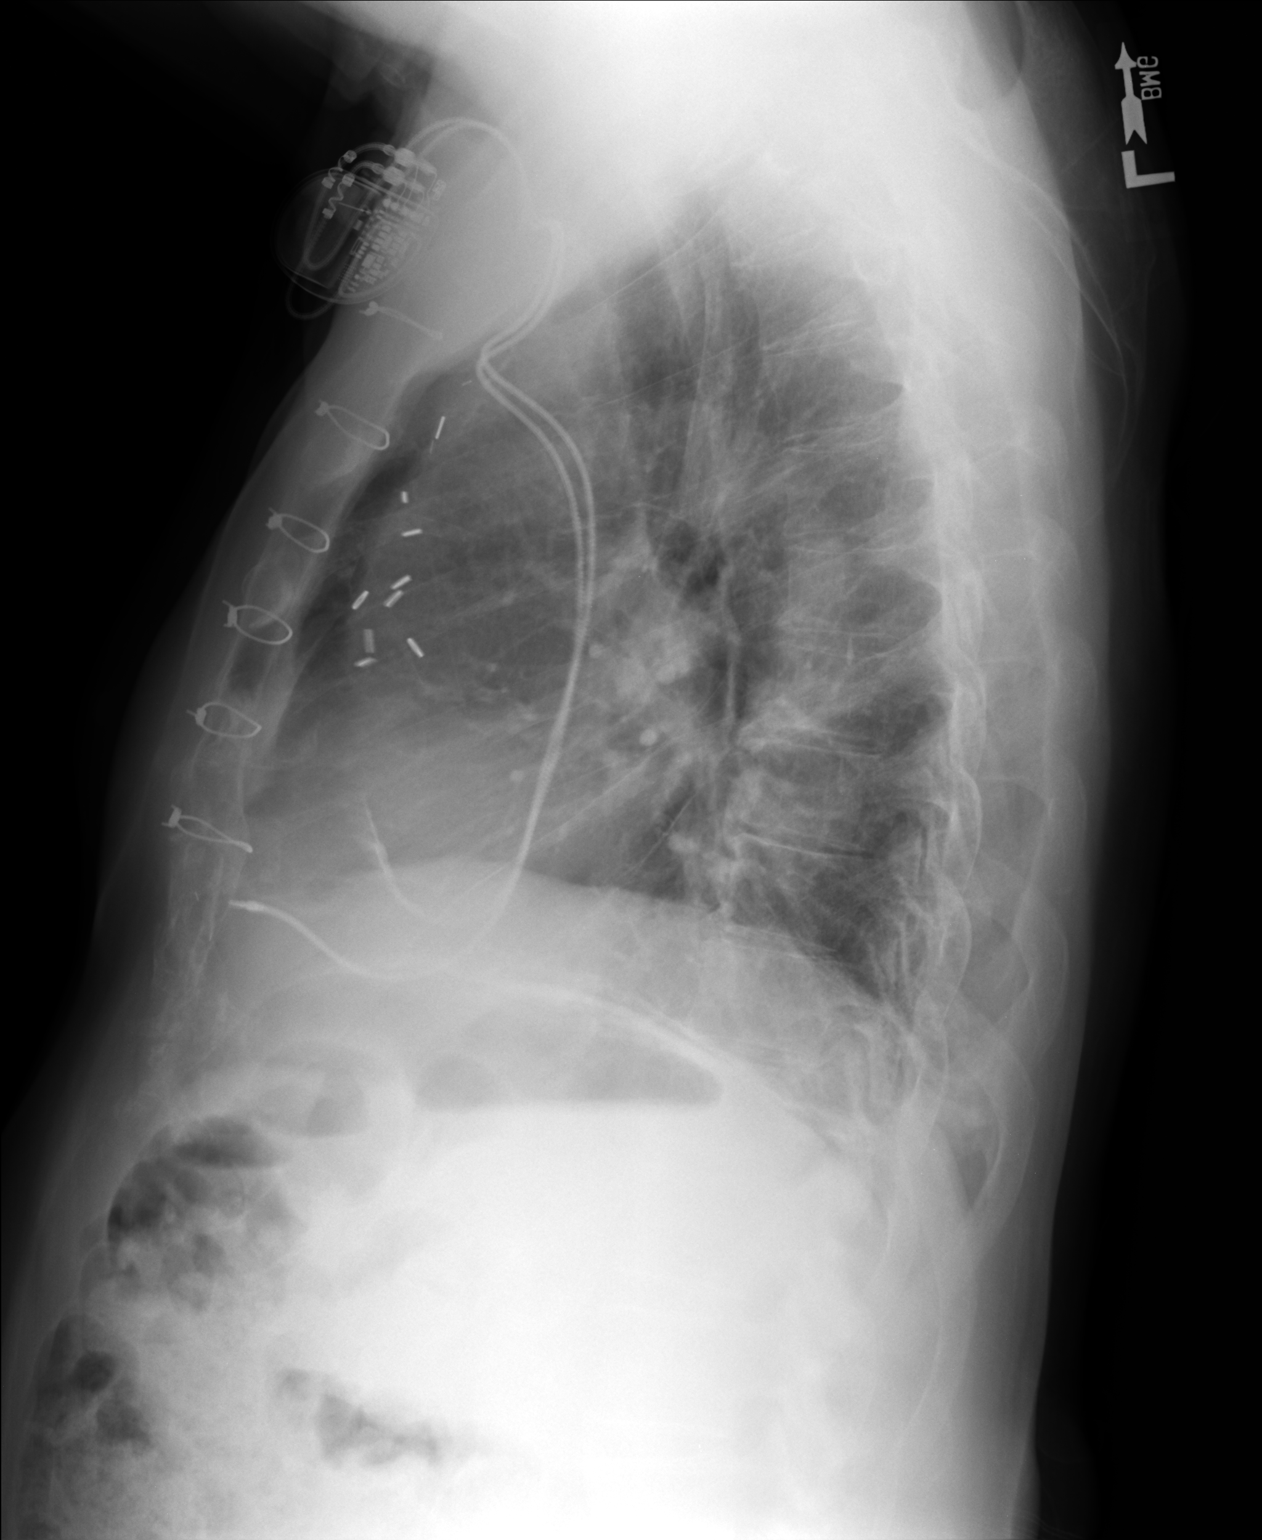

[2 of 2 positions shown; findings below may reference images not displayed]

FINDINGS: Left subclavian approach cardiac rhythm maintenance
device.  Leads project over the right atrium and right ventricle.
The patient is status post median sternotomy with evidence of prior
multivessel CABG.  The lungs are clear.  No edema, consolidation,
pneumothorax or pleural effusion.  Cardiac and mediastinal contours
within normal limits.  Aortic atherosclerotic changes are noted.
Mild pulmonary hyperexpansion.
IMPRESSION: No acute cardiopulmonary disease.

Aortic atherosclerosis.

## 2015-02-10 ENCOUNTER — Encounter: Payer: Self-pay | Admitting: Cardiology

## 2015-02-10 DIAGNOSIS — M1712 Unilateral primary osteoarthritis, left knee: Secondary | ICD-10-CM | POA: Diagnosis not present

## 2015-02-10 DIAGNOSIS — M25562 Pain in left knee: Secondary | ICD-10-CM | POA: Diagnosis not present

## 2015-02-10 LAB — CUP PACEART REMOTE DEVICE CHECK
Implantable Lead Implant Date: 20111021
Implantable Lead Location: 753859
Implantable Lead Location: 753860
Implantable Lead Model: 5086
Lead Channel Impedance Value: 408 Ohm
Lead Channel Setting Pacing Pulse Width: 0.4 ms
Lead Channel Setting Sensing Sensitivity: 0.9 mV
MDC IDC LEAD IMPLANT DT: 20111021
MDC IDC LEAD MODEL: 5086
MDC IDC MSMT BATTERY VOLTAGE: 2.96 V
MDC IDC MSMT LEADCHNL RV IMPEDANCE VALUE: 432 Ohm
MDC IDC MSMT LEADCHNL RV SENSING INTR AMPL: 9.141 mV
MDC IDC SESS DTM: 20161123222302
MDC IDC SET LEADCHNL RV PACING AMPLITUDE: 2.5 V
MDC IDC STAT BRADY RV PERCENT PACED: 56.79 %

## 2015-02-16 DIAGNOSIS — M1712 Unilateral primary osteoarthritis, left knee: Secondary | ICD-10-CM | POA: Diagnosis not present

## 2015-02-16 DIAGNOSIS — M25562 Pain in left knee: Secondary | ICD-10-CM | POA: Diagnosis not present

## 2015-02-19 DIAGNOSIS — E039 Hypothyroidism, unspecified: Secondary | ICD-10-CM | POA: Diagnosis not present

## 2015-02-19 LAB — TSH: TSH: 3.19 u[IU]/mL (ref 0.41–5.90)

## 2015-03-05 DIAGNOSIS — Z961 Presence of intraocular lens: Secondary | ICD-10-CM | POA: Diagnosis not present

## 2015-03-05 DIAGNOSIS — H0012 Chalazion right lower eyelid: Secondary | ICD-10-CM | POA: Diagnosis not present

## 2015-03-12 ENCOUNTER — Encounter: Payer: Medicare Other | Admitting: Internal Medicine

## 2015-03-25 ENCOUNTER — Ambulatory Visit (INDEPENDENT_AMBULATORY_CARE_PROVIDER_SITE_OTHER): Payer: Medicare Other | Admitting: Ophthalmology

## 2015-04-02 ENCOUNTER — Ambulatory Visit (INDEPENDENT_AMBULATORY_CARE_PROVIDER_SITE_OTHER): Payer: Medicare Other | Admitting: Ophthalmology

## 2015-04-02 ENCOUNTER — Ambulatory Visit: Payer: Medicare Other | Admitting: Internal Medicine

## 2015-04-02 DIAGNOSIS — H35033 Hypertensive retinopathy, bilateral: Secondary | ICD-10-CM

## 2015-04-02 DIAGNOSIS — H338 Other retinal detachments: Secondary | ICD-10-CM

## 2015-04-02 DIAGNOSIS — I10 Essential (primary) hypertension: Secondary | ICD-10-CM | POA: Diagnosis not present

## 2015-04-02 DIAGNOSIS — H43813 Vitreous degeneration, bilateral: Secondary | ICD-10-CM | POA: Diagnosis not present

## 2015-04-07 ENCOUNTER — Ambulatory Visit (INDEPENDENT_AMBULATORY_CARE_PROVIDER_SITE_OTHER): Payer: Medicare Other | Admitting: Internal Medicine

## 2015-04-07 ENCOUNTER — Encounter: Payer: Self-pay | Admitting: Internal Medicine

## 2015-04-07 VITALS — BP 102/62 | HR 63 | Ht 68.0 in | Wt 156.9 lb

## 2015-04-07 DIAGNOSIS — I5022 Chronic systolic (congestive) heart failure: Secondary | ICD-10-CM

## 2015-04-07 DIAGNOSIS — Z950 Presence of cardiac pacemaker: Secondary | ICD-10-CM

## 2015-04-07 DIAGNOSIS — I4819 Other persistent atrial fibrillation: Secondary | ICD-10-CM

## 2015-04-07 DIAGNOSIS — I714 Abdominal aortic aneurysm, without rupture, unspecified: Secondary | ICD-10-CM | POA: Insufficient documentation

## 2015-04-07 DIAGNOSIS — I481 Persistent atrial fibrillation: Secondary | ICD-10-CM

## 2015-04-07 NOTE — Progress Notes (Signed)
OFFICE NOTE  Chief Complaint:  Knee pain, improved  Primary Care Physician: [name], MD  HPI:  Jon [name]  is a 82 yo male formerly followed by Dr. Rex Kras and recently seen by Cecilie Kicks, NP, for left lower extremity edema with discoloration of his toes, was beginning to be uncomfortable for him to walk. We did venous Dopplers. He was quite concerned about arterial blood supply. His mother ended up with bilateral amputations. Venous Dopplers showed a ruptured baker cyst, no DVT. He was instructed on this and since that time his symptoms have resolved completely. He is on Coumadin for paroxysmal A-fib which led to the discoloration in his toes. Additionally, he has a history of abdominal aortic aneurysm and because he was concerned about his arterial disease, if he had it, we did Dopplers.  He is here for the results of the Dopplers. Bilateral ABIs were 1.0, though he does have appearance of an occluded right anterior tibial artery and a left posterior tib demonstrated a short segment of occlusive disease with reconstitution at the ankle. His pulses have been 2+. He has no claudication symptoms. Additionally, the duplex of his abdominal aorta was slightly, minimally changed from his last one. Previously it was 3.95 x 3.57, now he is 3.7 x 4.0, essentially the same, very slight increase. He has no abdominal pain and no other complaints. He has really no complaints today, feels quite well. He is not aware of any tachycardias or palpitations. He has a Medtronic pacemaker which was interrogated today. This demonstrates a battery voltage of 3.0V.  His a-fib burden is 1.6% and he is on amiodarone.  Other history includes bypass grafting in 1989; vein graft was stented in 2008. Last stress test was 2012, low risk study. EF was 49%. Pacemaker was placed for paroxysmal A-fib and bradycardia, sick sinus syndrome in 2011. He is also on warfarin.   Jon [name] underwent PFTs on 10/22/2012.  This showed a moderately severe restrictive limitation as well as moderately reduced diffusion capacity. Based on these findings I recommended discontinuing his amiodarone and he also stopped his pravastatin. Since that time he's noted that he feels quite a bit better including some minor improvement in his knee pain. He still feels like his left knee is bothering him and he is status post right TKR. 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 I did refer him to the [facility], but has not been contacted for appointment so far.  Since his last followup, Jon [name] was seen by Dr. Sallyanne Kuster for a device check in his device appeared to be working properly. He was having atrial fibrillation, but a little burden. He continued on warfarin which was generally therapeutic. Unfortunately, in December he had a thalamic stroke which resulted in some right facial droop and hemiparesis. That is improving , but slowly. He was then changed to Eliquis for anticoagulation. He was also started on Zetia, as he has a history of statin intolerance in the past.  An echo performed in the hospital demonstrated an EF of 40-45%, which is mildly reduced from previous studies. In addition, he has had a small amount of weight gain and lower extremity swelling which is noted over the past several weeks while in recovery.  Unfortunately, he suffered another stroke last month around the time that we plan to admit him for tikosyn induction. He is still trying to recover from that.   Jon [name] appears  euvolemic today. He does have a small amount of lower extremity swelling. His weight has been stable. He is reporting some epistaxis. He is concerned about the dose of his Eliquis. He is also reporting some urinary incontinence which is new. There is some flank pain and there is some possible concern for urinary tract infection however he is awaiting the results of a urinalysis.  Jon [name] was seen in the office today as  an add-on for worsening swelling and shortness of breath. I reviewed notes from [facility] which indicate in January because of excessive urination he requested to come off of his diuretics. He was taken off the diuretics but quickly developed swelling and worsening shortness of breath. A BNP was obtained which was greater than 300. He was restarted on his diuretics and had some improvement in his shortness of breath but still has persistent symptoms. He's also developed asymmetric right lower extremity edema. He underwent an in office Doppler ultrasound that was negative for DVT. Those results are communicated in the office notes, but no formal radiology report is available. It would be unlikely for him to have DVT given the fact that he's on Eliquis. He still has persistent right lower extremity edema. He reports shortness of breath, fatigue and difficulty speaking. He denies any chest pain.  He is also overdue for pacemaker check and that was performed in the office today under my supervision.  Jon [name] returns today in the office. He reports an improvement in his shortness of breath with an increased dose of Lasix. An echocardiogram was performed which shows preserved systolic function of the left ventricle however there is significant right heart dysfunction and severe dilatation of the right ventricle. This is likely the cause of his shortness of breath. He seems to respond and diaphoretic. He reports his fatigue is improving and is currently working with physical therapy.  I saw Jon [name] back today in the office. When I last saw him he had had some leg swelling and I recommended increasing his Lasix to 40 mg twice daily. Unfortunately due to problems with incontinence he did not make those changes. Over the past 3 months there is been about a 13 pound weight gain. He has significant leg swelling. This is likely due to some right heart failure symptoms which were suspected at his last office  visit. He does get a little more short of breath with exertion as well.  Jon [name] returns again today for close follow-up. He is now been taking Lasix 40 mg twice daily. His weight is actually down 3-4 pounds since his last office visit. He's been urinating quite a bit more. He did see Dr. Louis Meckel for a urology evaluation. His medicines were adjusted and although he was having some urinary retention now is having problems with incontinence. He appears to have had some improvement in his swelling and overall some mild improvement in shortness of breath however the other night was noted to be short of breath and hypoxic. He was placed on oxygen overnight. Is not clear whether he will need nocturnal oxygen however he does have a remote history of sleep apnea and has not used CPAP in some time. Labs in early October indicated elevated BNP of 350, but this is not been reassessed since diuresis.  Mr. Niebuhr reports some improvement in his symptoms. He's more energetic today to fact has had recent treatment of his knees with injections and therapy at [facility]. He says this has helped significantly. He  is walking and does appear somewhat brighter today. Generally his energy level is pretty good although he naps fairly regularly. His weight is been stable. He denies any worsening shortness of breath or chest pain. He just did a remote check and will be due to see Dr. Loletha Grayer back in the office in a few months. He is overdue for abdominal ultrasound as he had a small aortic aneurysm which was seen in 2014. He's also had carotid Dopplers which showed mild carotid disease.  PMHx:  Past Medical History  Diagnosis Date  . Hypertension   . Coronary artery disease   . Arthritis   . Cataract   . Anxiety   . Blood transfusion without reported diagnosis   . Hyperlipidemia   . Heart murmur   . Allergy   . Thyroid disease   . PAF (paroxysmal atrial fibrillation) (HCC)     coumadin  . History of abdominal aortic  aneurysm   . History of echocardiogram 12/2008    EF >55%; mild concentric LVH; mild MR; mild-mod TR; mild AV regurg;   . History of nuclear stress test 12/2010    lexiscan; low risk; compared to prior study, perfusion improved  . History of Doppler ultrasound 05/22/2012    LEAs; R anterior tibial artery appeared occluded; L posterior tibial shows short segment of occlusive ds  . History of Doppler ultrasound 05/22/2012    Abdominal Aortic Doppler; slight increase in fusiform aneurysm   . Stroke ([place])   . Adjustment disorder with mixed anxiety and depressed mood 04/18/2013    Post CVA depression and anxiety   . Abnormality of gait 04/18/2013  . 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 Increased exposure of the right cornea.   . Pruritic condition 01/24/2013  . Memory change 01/24/2013  . Abnormal PFTs 10/30/2012    Followed in Pulmonary clinic/ [facility]/ [place]  - PFT's 10/22/12 VC  55% no obst, DLCO 50%  - PFT's 12/20/2012 VC 83% and no obst, dLCO 55%  -11/08/2012  Walked RA x 3 laps @ 185 ft each stopped due to  End of study, not desat -11/08/12 esr 10    . Impotence of organic origin 10/03/2000  . Osteoarthrosis, unspecified whether generalized or localized, unspecified site 02/05/2003  . Hypertrophy of prostate with urinary obstruction and other lower urinary tract symptoms (LUTS) 04/28/2004  . Gout, unspecified 11/03/2004  . Acute on chronic systolic congestive heart failure, NYHA class 2 ([place]) 03/16/2006    BNP 376.9 05/28/13   . Atherosclerosis of renal artery ([place]) 10/18/2006  . Unspecified vitamin D deficiency 10/18/2006  . Obstructive sleep apnea (adult) (pediatric) 07/23/2008  . Major depressive disorder, single episode, unspecified ([place]) 03/05/2009  . Basal cell carcinoma of skin of other and unspecified parts of face 12/24/2009  . Internal hemorrhoids without mention of complication 95/63/8756  . Muscle weakness (generalized) 03/10/2011  . Pain in joint, lower leg  08/04/2011  . Nocturia 10/27/2011  . Aortic aneurysm of unspecified site without mention of rupture 10/27/2011  . Fall 03/04/2011  . Hyponatremia 03/04/2011  . TIA (transient ischemic attack) 04/30/2013  . Fatigue 04/18/2013  . 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 CVA - Rt brain stroke 12/14 02/18/2013    02/19/13 angiography CT head:  Diffuse atherosclerotic irregularity and plaque formation of the distal right common carotid artery and proximal internal carotid artery without significant stenosis. Plaque ulceration is  present and is a source of emboli. A small right thalamic CVA    . Dyslipidemia 11/03/2004  . Type II or unspecified type diabetes mellitus without mention of complication, not stated as uncontrolled 11/24/2004  . Spinal stenosis in cervical region 12/15/2004  . Right bundle branch block 04/19/2006  . Cardiac pacemaker in situ- MDT 10/11 03/18/2010    Medtronic revo implant in October 2011. Severe sinus bradycardia in the 30s, but not truly pacemaker dependent   . Long term (current) use of anticoagulants 03/17/2011  . Hypothyroidism 03/04/2011  . HTN (hypertension) 03/04/2011  . Atrial fibrillation ([place]) 03/04/2011     He was on coumadin until his stroke last December and was switched to apixaban 2.'5mg'$  daily which he has been taking faithfully without reported side effects.    . SSS (sick sinus syndrome) ([place]) 04/08/2014  . DM (diabetes mellitus), type 2 with renal complications ([place]) 2/83/1517  . CKD stage 2 due to type 2 diabetes mellitus ([place]) 07/03/2014  . Osteoarthritis of both knees 07/31/2014  . Quadriceps weakness 07/31/2014  . Balance disorder 07/31/2014  . Open wound of knee, leg (except thigh), and ankle, complicated 08/21/735    Past Surgical History  Procedure Laterality Date  . Coronary artery bypass graft  1989  . Pacemaker insertion  2011  . Insert / replace / remove pacemaker    . Eye surgery    . Joint  replacement    . Hernia repair    . Coronary angioplasty with stent placement  2008    stent to SVG to OM    FAMHx:  Family History  Problem Relation Age of Onset  . Diabetes Father   . Other Mother     PAD with amputations    SOCHx:   reports that he quit smoking about 48 years ago. His smoking use included Cigarettes. He has a 11.25 pack-year smoking history. He has never used smokeless tobacco. He reports that he does not drink alcohol or use illicit drugs.  ALLERGIES:  Allergies  Allergen Reactions  . Quinolones Other (See Comments)    Risk of rupture of AAA  . Caduet [Amlodipine-Atorvastatin] Other (See Comments)    Weakness   . Crestor [Rosuvastatin] Other (See Comments)    Muscle pain/weakness  . Lipitor [Atorvastatin Calcium] Other (See Comments)    Muscle pain/weakness  . Simvastatin Other (See Comments)    Muscle pain/weakness  . Lisinopril Other (See Comments)    Doesn't remember reaction    ROS: A comprehensive review of systems was negative except for: Respiratory: positive for dyspnea on exertion Cardiovascular: positive for irregular heart beat and lower extremity edema  HOME MEDS: Current Outpatient Prescriptions  Medication Sig Dispense Refill  . acetaminophen (TYLENOL) 325 MG tablet Take 650 mg by mouth every morning.     Marland Kitchen allopurinol (ZYLOPRIM) 100 MG tablet Take 50 mg by mouth daily.     Marland Kitchen amoxicillin (AMOXIL) 500 MG capsule Take 500 mg by mouth. Take 4 caps prior to dental procedures    . apixaban (ELIQUIS) 2.5 MG TABS tablet Take 1 tablet (2.5 mg total) by mouth 2 (two) times daily. 60 tablet 11  . carboxymethylcellulose (REFRESH PLUS) 0.5 % SOLN Place 1 drop into both eyes every evening.    . Cholecalciferol (VITAMIN D) 400 UNITS capsule Take 400 Units by mouth 2 (two) times daily.     . furosemide (LASIX) 20 MG tablet Take 20 mg by mouth daily as needed for fluid or edema (takes  $'20mg'b$  if gain >3 lbs in a day, >5 lbs in a week).    . furosemide  (LASIX) 40 MG tablet Take 40 mg by mouth 2 (two) times daily.     Marland Kitchen ipratropium (ATROVENT) 0.06 % nasal spray One puff in each nostril before each meal (Patient taking differently: Place 1 spray into both nostrils daily as needed for rhinitis. ) 15 mL 12  . levothyroxine (SYNTHROID, LEVOTHROID) 125 MCG tablet Take 125 mcg by mouth daily before breakfast.    . LORazepam (ATIVAN) 0.5 MG tablet Take 0.25 mg by mouth at bedtime.    Marland Kitchen losartan (COZAAR) 100 MG tablet Take 1 tablet (100 mg total) by mouth daily. 30 tablet 5  . metoprolol (LOPRESSOR) 100 MG tablet Take 100 mg by mouth 2 (two) times daily.    . nitroGLYCERIN (NITROSTAT) 0.4 MG SL tablet Place 0.4 mg under the tongue every 5 (five) minutes as needed for chest pain.     . Nutritional Supplements (BOOST COMPACT) LIQD Take 1 Container by mouth daily. 237 mls daily    . polyethylene glycol (MIRALAX / GLYCOLAX) packet Take 17 g by mouth daily.    . polyvinyl alcohol (LIQUIFILM TEARS) 1.4 % ophthalmic solution Place 1 drop into both eyes. One drop in both eyes twice daily as needed for dry eyes    . Wheat Dextrin (BENEFIBER PO) Take 15 mLs by mouth daily.      No current facility-administered medications for this visit.    LABS/IMAGING: No results found for this or any previous visit (from the past 48 hour(s)). No results found.  VITALS: BP 102/62 mmHg  Pulse 63  Ht '5\' 8"'$  (1.727 m)  Wt 156 lb 14.4 oz (71.169 kg)  BMI 23.86 kg/m2  EXAM: GEN: Awake, NAD HEENT: PERRLA, EOMI Neck: JVP elevated to 3 cm water Cardiovascular: Irregularly irregular Abdomen: Soft, nontender, mildly distended Extremities: 1+ bilateral pitting edema Skin: Warm, dry Neurologic: Grossly intact Psych: Pleasant  EKG: Ventricular paced rhythm at 63  ASSESSMENT: 1. Acute on chronic systolic, right sided- congestive heart failure, NYHA Class II symptoms - EF 55-60% 2. Persistent atrial fibrillation 3. Sick sinus syndrome status post pacemaker placement with  normal function 4. Abnormal PFT's - off of amiodarone 5. History of abdominal aortic aneurysm with mild enlargement 6. Coronary artery disease status post CABG in 1989 with stent to the vein graft in 2008 7. Minimal PAD with normal ABIs bilaterally 8. Recent thalamic stroke - now on Eliquis 9. Epistaxis 10. Urinary incontinence 11. Mild bilateral carotid artery disease  PLAN: 1.   Mr. Lubeck seems a bit more energetic today. His family recently has noted some improvement, which may be due to some pain relief from joint injections. Some of his urinary incontinence have improved. He seems to be euvolemic on exam today. He just had a remote pacer check in all scheduled for follow-up with Dr. Loletha Grayer. He is overdue for screening of his abdominal aortic aneurysm. We'll go ahead and order abdominal ultrasound.   Plan to see him back in 6 months.  Pixie Casino, MD, [facility] Attending Cardiologist [place] 04/07/2015, 1:18 PM

## 2015-04-07 NOTE — Patient Instructions (Signed)
Your physician has requested that you have an abdominal aorta duplex. During this test, an ultrasound is used to evaluate the aorta. Allow 30 minutes for this exam. Do not eat after midnight the day before and avoid carbonated beverages  Your physician recommends that you schedule a follow-up appointment in 3 months with Dr Sallyanne Kuster with device check.  Dr Debara Pickett recommends that you schedule a follow-up appointment in 6 months. You will receive a reminder letter in the mail two months in advance. If you don't receive a letter, please call our office to schedule the follow-up appointment.  If you need a refill on your cardiac medications before your next appointment, please call your pharmacy.

## 2015-04-09 ENCOUNTER — Non-Acute Institutional Stay: Payer: Medicare Other | Admitting: Nurse Practitioner

## 2015-04-09 ENCOUNTER — Encounter: Payer: Self-pay | Admitting: Nurse Practitioner

## 2015-04-09 DIAGNOSIS — M5440 Lumbago with sciatica, unspecified side: Secondary | ICD-10-CM

## 2015-04-09 DIAGNOSIS — I480 Paroxysmal atrial fibrillation: Secondary | ICD-10-CM

## 2015-04-09 DIAGNOSIS — K5900 Constipation, unspecified: Secondary | ICD-10-CM | POA: Diagnosis not present

## 2015-04-09 DIAGNOSIS — I5022 Chronic systolic (congestive) heart failure: Secondary | ICD-10-CM | POA: Diagnosis not present

## 2015-04-09 DIAGNOSIS — E039 Hypothyroidism, unspecified: Secondary | ICD-10-CM

## 2015-04-09 DIAGNOSIS — L0591 Pilonidal cyst without abscess: Secondary | ICD-10-CM | POA: Diagnosis not present

## 2015-04-09 DIAGNOSIS — N182 Chronic kidney disease, stage 2 (mild): Secondary | ICD-10-CM | POA: Diagnosis not present

## 2015-04-09 DIAGNOSIS — E1122 Type 2 diabetes mellitus with diabetic chronic kidney disease: Secondary | ICD-10-CM

## 2015-04-09 DIAGNOSIS — I10 Essential (primary) hypertension: Secondary | ICD-10-CM | POA: Diagnosis not present

## 2015-04-09 DIAGNOSIS — N401 Enlarged prostate with lower urinary tract symptoms: Secondary | ICD-10-CM | POA: Diagnosis not present

## 2015-04-09 DIAGNOSIS — M1000 Idiopathic gout, unspecified site: Secondary | ICD-10-CM | POA: Diagnosis not present

## 2015-04-09 NOTE — Assessment & Plan Note (Signed)
.  A tender Pilonidal cyst like growth at the right side of anus. Surgeon referral.

## 2015-04-09 NOTE — Assessment & Plan Note (Signed)
Controlled, continue Metoprolol 100 mg twice a day, Losartan 100mg , Furosemide 40mg  bid.

## 2015-04-09 NOTE — Assessment & Plan Note (Signed)
12/09/14 Bun/creat 24/0.95 01/16/15 Bun/creat 25/0.97

## 2015-04-09 NOTE — Assessment & Plan Note (Signed)
compensated clinically, continue Furosemide 40mg  bid, prn Furosemide.

## 2015-04-09 NOTE — Assessment & Plan Note (Signed)
09/16/14 TSH 2.965 01/15/15 7.202 02/19/15 TSH 3.194 Continue Levothyroxine 172mcg

## 2015-04-09 NOTE — Assessment & Plan Note (Signed)
No flare ups. continue Allopurinol 50mg daily.  

## 2015-04-09 NOTE — Assessment & Plan Note (Signed)
Tolerable, continue Tylenol 650mg  qam

## 2015-04-09 NOTE — Assessment & Plan Note (Signed)
Rate controlled, continue Metoprolol 100mg bid. Eliquis for VTE risk reduction.  

## 2015-04-09 NOTE — Assessment & Plan Note (Signed)
Baseline urinary frequency, continue Tamsulosin 0.4mg 

## 2015-04-09 NOTE — Progress Notes (Signed)
Patient ID: Jon [name], male   DOB: August 20, 1921, 82 y.o.   MRN: [id number]  Location:  [facility] Provider:  Marlana Latus NP  Code Status:  DNR Goals of care: Advanced Directive information Does patient have an advance directive?: Yes, Type of Advance Directive: Healthcare Power of Attorney  Chief Complaint  Patient presents with  . Acute Visit    Rectal Cyst     HPI: Patient is a 82 y.o. male seen in the [place] at [facility] today for evaluation of tender Pilonidal cyst like growth at the right side of anus. elevated TSH, 7.202 01/16/15, presently on Levothyroxine 122mg, TSH 2.965 09/16/14.   CHF: less SOB, but still DOE, moist rales back of lungs, on Furosemide '40mg'$  bid.   Hypothyroidism, taking Levothyroxine 1068m, last TSH wnl  BPH: no urinary retention, but dribbling, taking Tamsulosin 0.'4mg'$  daily  Gout: no flare ups, taking Allopurinol '100mg'$   Afib/late effect of CVA: heart rate is in control, left facial weakness, ambulates with walker, taking Eliquis   Review of Systems  Constitutional: Positive for malaise/fatigue. Negative for fever.       Generalized weakness  HENT: Positive for hearing loss. Negative for congestion, ear discharge, ear pain and tinnitus.   Eyes: Negative.   Respiratory: Positive for shortness of breath. Negative for cough and wheezing.   Cardiovascular: Positive for leg swelling (sometimes, not apparent today. ). Negative for palpitations.       Trace edema BLE, R>L  Gastrointestinal: Negative for abdominal pain and diarrhea.       Chokes on swallowing since his CVA. Episodes of fecal incontinence. Hx of hemorrhoids.A tender Pilonidal cyst like growth at the right side of anus   Genitourinary: Positive for urgency and frequency.       Incontinent from time to time. Followed by Dr. MaMatilde Sprang Musculoskeletal: Positive for back pain. Negative for neck pain.  Skin: Negative for rash.  Neurological: Positive for weakness.       Thalamic CVA  02/18/2013. Residual right side weakness, slurred speech, and depression. Recurrent CVA on 04/30/13 of the left MCA area Left facial weakness  Psychiatric/Behavioral: Positive for memory loss. Negative for depression. The patient is not nervous/anxious and does not have insomnia.     Past Medical History  Diagnosis Date  . Hypertension   . Coronary artery disease   . Arthritis   . Cataract   . Anxiety   . Blood transfusion without reported diagnosis   . Hyperlipidemia   . Heart murmur   . Allergy   . Thyroid disease   . PAF (paroxysmal atrial fibrillation) (HCC)     coumadin  . History of abdominal aortic aneurysm   . History of echocardiogram 12/2008    EF >55%; mild concentric LVH; mild MR; mild-mod TR; mild AV regurg;   . History of nuclear stress test 12/2010    lexiscan; low risk; compared to prior study, perfusion improved  . History of Doppler ultrasound 05/22/2012    LEAs; R anterior tibial artery appeared occluded; L posterior tibial shows short segment of occlusive ds  . History of Doppler ultrasound 05/22/2012    Abdominal Aortic Doppler; slight increase in fusiform aneurysm   . [place]  . Adjustment disorder with mixed anxiety and depressed mood 04/18/2013    Post CVA depression and anxiety   . Abnormality of gait 04/18/2013  . 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 Increased exposure of the right cornea.   .Marland Kitchen  Pruritic condition 01/24/2013  . Memory change 01/24/2013  . Abnormal PFTs 10/30/2012    Followed in Pulmonary clinic/ [facility]/ [place]  - PFT's 10/22/12 VC  55% no obst, DLCO 50%  - PFT's 12/20/2012 VC 83% and no obst, dLCO 55%  -11/08/2012  Walked RA x 3 laps @ 185 ft each stopped due to  End of study, not desat -11/08/12 esr 10    . Impotence of organic origin 10/03/2000  . Osteoarthrosis, unspecified whether generalized or localized, unspecified site 02/05/2003  . Hypertrophy of prostate with urinary obstruction and other lower  urinary tract symptoms (LUTS) 04/28/2004  . Gout, unspecified 11/03/2004  . Acute on chronic systolic congestive heart failure, NYHA class 2 ([place]) 03/16/2006    BNP 376.9 05/28/13   . Atherosclerosis of renal artery ([place]) 10/18/2006  . Unspecified vitamin D deficiency 10/18/2006  . Obstructive sleep apnea (adult) (pediatric) 07/23/2008  . Major depressive disorder, single episode, unspecified ([place]) 03/05/2009  . Basal cell carcinoma of skin of other and unspecified parts of face 12/24/2009  . Internal hemorrhoids without mention of complication 96/78/9381  . Muscle weakness (generalized) 03/10/2011  . Pain in joint, lower leg 08/04/2011  . Nocturia 10/27/2011  . Aortic aneurysm of unspecified site without mention of rupture 10/27/2011  . Fall 03/04/2011  . Hyponatremia 03/04/2011  . TIA (transient ischemic attack) 04/30/2013  . Fatigue 04/18/2013  . 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 CVA - Rt brain stroke 12/14 02/18/2013    02/19/13 angiography CT head:  Diffuse atherosclerotic irregularity and plaque formation of the distal right common carotid artery and proximal internal carotid artery without significant stenosis. Plaque ulceration is present and is a source of emboli. A small right thalamic CVA    . Dyslipidemia 11/03/2004  . Type II or unspecified type diabetes mellitus without mention of complication, not stated as uncontrolled 11/24/2004  . Spinal stenosis in cervical region 12/15/2004  . Right bundle branch block 04/19/2006  . Cardiac pacemaker in situ- MDT 10/11 03/18/2010    Medtronic revo implant in October 2011. Severe sinus bradycardia in the 30s, but not truly pacemaker dependent   . Long term (current) use of anticoagulants 03/17/2011  . Hypothyroidism 03/04/2011  . HTN (hypertension) 03/04/2011  . Atrial fibrillation ([place]) 03/04/2011     He was on coumadin until his stroke last December and was switched to apixaban 2.'5mg'$  daily  which he has been taking faithfully without reported side effects.    . SSS (sick sinus syndrome) ([place]) 04/08/2014  . DM (diabetes mellitus), type 2 with renal complications ([place]) 0/17/5102  . CKD stage 2 due to type 2 diabetes mellitus ([place]) 07/03/2014  . Osteoarthritis of both knees 07/31/2014  . Quadriceps weakness 07/31/2014  . Balance disorder 07/31/2014  . Open wound of knee, leg (except thigh), and ankle, complicated 5/85/2778    Patient Active Problem List   Diagnosis Date Noted  . Pilonidal cyst 04/09/2015  . AAA (abdominal aortic aneurysm) ([place]) 04/07/2015  . Chalazion of right lower eyelid 10/24/2014  . Right heart failure ([place]) 09/03/2014  . Osteoarthritis of both knees 07/31/2014  . Quadriceps weakness 07/31/2014  . Balance disorder 07/31/2014  . Open wound of knee, leg (except thigh), and ankle, complicated 24/23/5361  . Dyspnea 07/03/2014  . DM (diabetes mellitus), type 2 with renal complications ([place]) 44/31/5400  . CKD stage 2 due to type 2 diabetes mellitus ([place]) 07/03/2014  . Lower  back pain 05/29/2014  . SSS (sick sinus syndrome) ([place]) 04/08/2014  . Constipation 03/27/2014  . Soreness of tongue 03/27/2014  . Fecal incontinence 02/17/2014  . Chronic systolic congestive heart failure ([place]) 02/17/2014  . Urgency of urination 02/13/2014  . Hemorrhoid 02/13/2014  . Cerebral infarction due to embolism of cerebral artery ([place]) 01/06/2014  . UTI (urinary tract infection) 12/26/2013  . GERD (gastroesophageal reflux disease) 10/24/2013  . Epistaxis 10/22/2013  . Dysphagia, unspecified(787.20) 10/16/2013  . Cough 09/12/2013  . Foot spasms 09/12/2013  . Rhinorrhea 08/15/2013  . Edema 06/01/2013  . TIA (transient ischemic attack) 04/30/2013  . History of ischemic left MCA stroke 04/30/2013  . 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 Abnormality of gait 04/18/2013  . Adjustment disorder with mixed anxiety and depressed mood 04/18/2013  .  Spell of generalized weakness 04/18/2013  . Depression due to stroke ([place]) 04/11/2013  . 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 CVA - Rt brain stroke 12/14 02/18/2013  . Memory change 01/24/2013  . Pruritic condition 01/24/2013  . Abnormal PFTs 10/30/2012  . Allergic rhinitis 09/13/2012  . Aortic aneurysm of unspecified site without mention of rupture 10/27/2011  . Nocturia 10/27/2011  . Pain in left knee 08/04/2011  . Long term current use of anticoagulant therapy 03/17/2011  . Muscle weakness (generalized) 03/10/2011  . Atrial fibrillation ([place]) 03/04/2011  . Fall 03/04/2011  . HTN (hypertension) 03/04/2011  . Hypothyroidism 03/04/2011  . Internal hemorrhoids without mention of complication 33/82/5053  . Cardiac pacemaker in situ- MDT 10/11 03/18/2010  . Basal cell carcinoma of skin of other and unspecified parts of face 12/24/2009  . Other specified cardiac dysrhythmias(427.89) 12/24/2009  . Unspecified sinusitis (chronic) 07/01/2009  . Major depressive disorder, single episode, unspecified ([place]) 03/05/2009  . Osteoarthrosis, unspecified whether generalized or localized, lower leg 01/19/2009  . Syncope and collapse 01/19/2009  . Obstructive sleep apnea (adult) (pediatric) 07/23/2008  . Unspecified vitamin D deficiency 10/18/2006  . Atherosclerosis of renal artery ([place]) 10/18/2006  . Pain in joint, shoulder region 10/18/2006  . Right bundle branch block 04/19/2006  . Urinary frequency 03/16/2006  . Spinal stenosis in cervical region 12/15/2004  . Type 2 diabetes mellitus with circulatory disorder ([place]) 11/24/2004  . Dyslipidemia 11/03/2004  . Gout 11/03/2004  . Coronary atherosclerosis 11/03/2004  . Enlarged prostate with lower urinary tract symptoms (LUTS) 04/28/2004  . Osteoarthritis 02/05/2003  . Impotence of organic origin 10/03/2000    Allergies  Allergen Reactions  . Quinolones Other (See Comments)    Risk of rupture of AAA  . Caduet [Amlodipine-Atorvastatin] Other (See  Comments)    Weakness   . Crestor [Rosuvastatin] Other (See Comments)    Muscle pain/weakness  . Lipitor [Atorvastatin Calcium] Other (See Comments)    Muscle pain/weakness  . Simvastatin Other (See Comments)    Muscle pain/weakness  . Lisinopril Other (See Comments)    Doesn't remember reaction    Medications: Patient's Medications  New Prescriptions   No medications on file  Previous Medications   ACETAMINOPHEN (TYLENOL) 325 MG TABLET    Take 650 mg by mouth every morning.    ALLOPURINOL (ZYLOPRIM) 100 MG TABLET    Take 50 mg by mouth daily.    AMOXICILLIN (AMOXIL) 500 MG CAPSULE    Take 500 mg by mouth. Take 4 caps prior to dental procedures   APIXABAN (ELIQUIS) 2.5 MG TABS TABLET    Take 1 tablet (2.5 mg total) by mouth 2 (two) times daily.  CARBOXYMETHYLCELLULOSE (REFRESH PLUS) 0.5 % SOLN    Place 1 drop into both eyes every evening.   CHOLECALCIFEROL (VITAMIN D) 400 UNITS CAPSULE    Take 400 Units by mouth 2 (two) times daily.    FUROSEMIDE (LASIX) 20 MG TABLET    Take 20 mg by mouth daily as needed for fluid or edema (takes '20mg'$  if gain >3 lbs in a day, >5 lbs in a week).   FUROSEMIDE (LASIX) 40 MG TABLET    Take 40 mg by mouth 2 (two) times daily.    HYDROCORTISONE CREAM 1 %    Apply to hemorrhoids twice daily   IPRATROPIUM (ATROVENT) 0.06 % NASAL SPRAY    One puff in each nostril before each meal   LEVOTHYROXINE (SYNTHROID, LEVOTHROID) 125 MCG TABLET    Take 125 mcg by mouth daily before breakfast.   LORAZEPAM (ATIVAN) 0.5 MG TABLET    Take 0.25 mg by mouth at bedtime. Take 1 tab by mouth every four hours as needed   LOSARTAN (COZAAR) 100 MG TABLET    Take 1 tablet (100 mg total) by mouth daily.   METOPROLOL (LOPRESSOR) 100 MG TABLET    Take 100 mg by mouth 2 (two) times daily.   NITROGLYCERIN (NITROSTAT) 0.4 MG SL TABLET    Place 0.4 mg under the tongue every 5 (five) minutes as needed for chest pain.    NUTRITIONAL SUPPLEMENTS (BOOST COMPACT) LIQD    Take 1 Container by  mouth daily. 237 mls daily   OLOPATADINE (PATANOL) 0.1 % OPHTHALMIC SOLUTION    Place 1 drop into both eyes 2 (two) times daily.   POLYETHYLENE GLYCOL (MIRALAX / GLYCOLAX) PACKET    Take 17 g by mouth daily.   POLYVINYL ALCOHOL (LIQUIFILM TEARS) 1.4 % OPHTHALMIC SOLUTION    Place 1 drop into both eyes. One drop in both eyes twice daily as needed for dry eyes   PROTEIN SUPPLEMENT (RESOURCE BENEPROTEIN) POWD    Take 1 scoop by mouth daily as needed.   TAMSULOSIN (FLOMAX) 0.4 MG CAPS CAPSULE    Take one cap by mouth daily   TOBRAMYCIN-DEXAMETHASONE (TOBRADEX) OPHTHALMIC OINTMENT    Place 1 application into both eyes 2 (two) times daily.   WHEAT DEXTRIN (BENEFIBER PO)    Take 15 mLs by mouth daily.   Modified Medications   No medications on file  Discontinued Medications   No medications on file    Physical Exam: Filed Vitals:   04/09/15 1221  BP: 132/70  Pulse: 66  Temp: 96.7 F (35.9 C)  TempSrc: Oral  Resp: 18  Height: '5\' 8"'$  (1.727 m)  Weight: 159 lb (72.122 kg)   Body mass index is 24.18 kg/(m^2).  Physical Exam  Constitutional: He is oriented to person, place, and time. He appears well-developed and well-nourished. No distress.  HENT:  Severe hearing loss. Bilateral aides. Right facial droop.   Eyes:  Corrective lenses. Increased exposure of the right cornea due to post CVA facial droop. Irregular pupil on the left  Neck: No JVD present. No tracheal deviation present. No thyromegaly present.  Cardiovascular: Normal rate and normal heart sounds.  Frequent extrasystoles are present.  No murmur heard. Pacemaker left upper chest  Pulmonary/Chest: Effort normal. He has no wheezes. He has rales.  dry rales posterior lung bases.   Abdominal: Soft. Bowel sounds are normal. He exhibits no distension and no mass. There is no tenderness.  Genitourinary:  .A tender Pilonidal cyst like growth at the right side  of anus  Musculoskeletal: Normal range of motion. He exhibits edema. He  exhibits no tenderness.  Unstable gait. Using 4 wheel walker and w/c. Weaker on the right side. Hx of the right total knee replacement. Trace edema BLE, R>L  Lymphadenopathy:    He has no cervical adenopathy.  Neurological: He is alert and oriented to person, place, and time. He displays normal reflexes. No cranial nerve deficit. He exhibits normal muscle tone. Coordination normal.  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 Recurrent CVA on 04/30/13 of the left MCA area Left facial weakness  Skin: Skin is warm and dry. No rash noted. No erythema. No pallor.  Psychiatric: He has a normal mood and affect. His behavior is normal. Judgment and thought content normal.  Nursing note and vitals reviewed.   Labs reviewed: Basic Metabolic Panel:  Recent Labs  12/09/14 12/24/14 01/16/15  NA 135* 138 139  K 4.0 4.0 3.8  BUN 24* 27* 25*  CREATININE 1.0 0.9 1.0    Liver Function Tests:  Recent Labs  05/30/14 09/16/14 01/16/15  AST '14 17 22  '$ ALT '10 12 11  '$ ALKPHOS 93 87 104    CBC:  Recent Labs  09/16/14 11/25/14 01/16/15  WBC 5.7 7.2 7.1  HGB 14.3 15.0 16.0  HCT 42 44 48  PLT 223 200 180    Lab Results  Component Value Date   TSH 3.19 02/19/2015   Lab Results  Component Value Date   HGBA1C 6.5* 08/29/2013   Lab Results  Component Value Date   CHOL 117 05/01/2013   HDL 30* 05/01/2013   LDLCALC 71 05/01/2013   TRIG 79 05/01/2013   CHOLHDL 3.9 05/01/2013    Significant Diagnostic Results since last visit: none  Patient Care Team: [name], MD as PCP - General (Internal Medicine) [facility] Pixie Casino, MD as Consulting Physician (Cardiology) Ardis Hughs, MD as Attending Physician (Urology)  Assessment/Plan Problem List Items Addressed This Visit    Atrial fibrillation ([place]) (Chronic)    Rate controlled, continue Metoprolol '100mg'$  bid. Eliquis for VTE risk reduction.      HTN (hypertension)  (Chronic)    Controlled, continue Metoprolol 100 mg twice a day, Losartan '100mg'$ , Furosemide '40mg'$  bid.      Hypothyroidism (Chronic)    09/16/14 TSH 2.965 01/15/15 7.202 02/19/15 TSH 3.194 Continue Levothyroxine 169mg      Chronic systolic congestive heart failure (HCC) (Chronic)    compensated clinically, continue Furosemide '40mg'$  bid, prn Furosemide.      Lower back pain (Chronic)    Tolerable, continue Tylenol '650mg'$  qam      CKD stage 2 due to type 2 diabetes mellitus (HCC) (Chronic)    12/09/14 Bun/creat 24/0.95 01/16/15 Bun/creat 25/0.97      Gout    No flare ups. continue Allopurinol '50mg'$  daily.       Enlarged prostate with lower urinary tract symptoms (LUTS)    Baseline urinary frequency, continue Tamsulosin 0.'4mg'$       Relevant Medications   tamsulosin (FLOMAX) 0.4 MG CAPS capsule   Constipation    Stable, continue Benefiber and prn MiraLax      Pilonidal cyst - Primary    .A tender Pilonidal cyst like growth at the right side of anus. Surgeon referral.           Family/ staff Communication: surgeon referral.   Labs/tests ordered: none  MMarlana LatusNP [facility]  [address], [place] [address] On Call:  [phone] & follow prompts after 5pm & weekends Office Phone:  [phone] Office Fax:  [phone]

## 2015-04-09 NOTE — Assessment & Plan Note (Signed)
Stable, continue Benefiber and prn MiraLax 

## 2015-04-09 NOTE — Progress Notes (Signed)
Patient ID: Jon [name], male   DOB: April 24, 1921, 82 y.o.   MRN: [id number]

## 2015-04-13 DIAGNOSIS — K603 Anal fistula: Secondary | ICD-10-CM | POA: Diagnosis not present

## 2015-04-14 ENCOUNTER — Ambulatory Visit (HOSPITAL_COMMUNITY)
Admission: RE | Admit: 2015-04-14 | Discharge: 2015-04-14 | Disposition: A | Discharge status: Home / Self Care | Payer: Medicare Other | Source: Ambulatory Visit | Attending: Cardiology | Admitting: Cardiology

## 2015-04-14 DIAGNOSIS — I714 Abdominal aortic aneurysm, without rupture, unspecified: Secondary | ICD-10-CM

## 2015-04-14 DIAGNOSIS — I700 Atherosclerosis of aorta: Secondary | ICD-10-CM | POA: Diagnosis not present

## 2015-04-14 DIAGNOSIS — I129 Hypertensive chronic kidney disease with stage 1 through stage 4 chronic kidney disease, or unspecified chronic kidney disease: Secondary | ICD-10-CM | POA: Insufficient documentation

## 2015-04-14 DIAGNOSIS — E785 Hyperlipidemia, unspecified: Secondary | ICD-10-CM | POA: Diagnosis not present

## 2015-04-14 DIAGNOSIS — E1122 Type 2 diabetes mellitus with diabetic chronic kidney disease: Secondary | ICD-10-CM | POA: Insufficient documentation

## 2015-04-14 DIAGNOSIS — N182 Chronic kidney disease, stage 2 (mild): Secondary | ICD-10-CM | POA: Insufficient documentation

## 2015-04-16 ENCOUNTER — Encounter: Payer: Self-pay | Admitting: Nurse Practitioner

## 2015-04-16 ENCOUNTER — Non-Acute Institutional Stay: Payer: Medicare Other | Admitting: Nurse Practitioner

## 2015-04-16 DIAGNOSIS — N401 Enlarged prostate with lower urinary tract symptoms: Secondary | ICD-10-CM

## 2015-04-16 DIAGNOSIS — I10 Essential (primary) hypertension: Secondary | ICD-10-CM | POA: Diagnosis not present

## 2015-04-16 DIAGNOSIS — E1122 Type 2 diabetes mellitus with diabetic chronic kidney disease: Secondary | ICD-10-CM

## 2015-04-16 DIAGNOSIS — K5900 Constipation, unspecified: Secondary | ICD-10-CM

## 2015-04-16 DIAGNOSIS — I482 Chronic atrial fibrillation: Secondary | ICD-10-CM

## 2015-04-16 DIAGNOSIS — I5022 Chronic systolic (congestive) heart failure: Secondary | ICD-10-CM | POA: Diagnosis not present

## 2015-04-16 DIAGNOSIS — I4821 Permanent atrial fibrillation: Secondary | ICD-10-CM

## 2015-04-16 DIAGNOSIS — N182 Chronic kidney disease, stage 2 (mild): Secondary | ICD-10-CM | POA: Diagnosis not present

## 2015-04-16 DIAGNOSIS — M5440 Lumbago with sciatica, unspecified side: Secondary | ICD-10-CM

## 2015-04-16 DIAGNOSIS — E039 Hypothyroidism, unspecified: Secondary | ICD-10-CM | POA: Diagnosis not present

## 2015-04-16 DIAGNOSIS — I714 Abdominal aortic aneurysm, without rupture, unspecified: Secondary | ICD-10-CM

## 2015-04-16 DIAGNOSIS — M1000 Idiopathic gout, unspecified site: Secondary | ICD-10-CM | POA: Diagnosis not present

## 2015-04-20 NOTE — Assessment & Plan Note (Signed)
09/16/14 TSH 2.965 01/15/15 7.202 02/19/15 TSH 3.194 Continue Levothyroxine 127mcg

## 2015-04-20 NOTE — Assessment & Plan Note (Signed)
No flare ups. continue Allopurinol 50mg daily.  

## 2015-04-20 NOTE — Assessment & Plan Note (Signed)
compensated clinically, continue Furosemide 40mg  bid, prn Furosemide. Noted some nonproductive cough at night, but denied paroxysmal nocturnal orthopnea.

## 2015-04-20 NOTE — Assessment & Plan Note (Signed)
Baseline urinary frequency, continue Tamsulosin 0.4mg 

## 2015-04-20 NOTE — Assessment & Plan Note (Signed)
Rate controlled, continue Metoprolol 100mg bid. Eliquis for VTE risk reduction.  

## 2015-04-20 NOTE — Assessment & Plan Note (Signed)
Tolerable, continue Tylenol 650mg  qam

## 2015-04-20 NOTE — Assessment & Plan Note (Signed)
Stable, continue Benefiber and prn MiraLax 

## 2015-04-20 NOTE — Assessment & Plan Note (Signed)
F/u cardiology 

## 2015-04-20 NOTE — Assessment & Plan Note (Signed)
Controlled, continue Metoprolol 100 mg twice a day, Losartan 100mg , Furosemide 40mg  bid.

## 2015-04-20 NOTE — Assessment & Plan Note (Signed)
12/09/14 Bun/creat 24/0.95 01/16/15 Bun/creat 25/0.97

## 2015-04-20 NOTE — Progress Notes (Signed)
Patient ID: Jon [name], male   DOB: 01-21-1922, 82 y.o.   MRN: [id number]  Location:  [facility] Provider:  Marlana Latus NP  Code Status:  DNR Goals of care: Advanced Directive information Does patient have an advance directive?: Yes, Type of Advance Directive: Healthcare Power of Attorney  Chief Complaint  Patient presents with  . Acute Visit    cough     HPI: Patient is a 82 y.o. male seen in the [place] at [facility] today for evaluation of no productive cough, running nose x 2 days, but started to feel better today, pending surgical eval for tender Pilonidal cyst like growth at the right side of anus. elevated TSH, 7.202 01/16/15, presently on Levothyroxine 170mg, TSH wnl 3.19 02/19/15. Hx of CVA, CHF, HTN, anxiety, urinary frequency.   CHF: less SOB, but still DOE, moist rales back of lungs, on Furosemide '40mg'$  bid.   Hypothyroidism, taking Levothyroxine 1279m, last TSH wnl  BPH: no urinary retention, but dribbling, taking Tamsulosin 0.'4mg'$  daily  Gout: no flare ups, taking Allopurinol '100mg'$   Afib/late effect of CVA: heart rate is in control, left facial weakness, ambulates with walker, taking Eliquis   Review of Systems  Constitutional: Negative for fever and malaise/fatigue.       Generalized weakness  HENT: Positive for congestion and hearing loss. Negative for ear discharge, ear pain and tinnitus.   Eyes: Negative.   Respiratory: Positive for cough and shortness of breath. Negative for wheezing.   Cardiovascular: Positive for leg swelling (sometimes, not apparent today. ). Negative for palpitations.       Trace edema BLE, R>L  Gastrointestinal: Negative for abdominal pain and diarrhea.       Chokes on swallowing since his CVA. Episodes of fecal incontinence. Hx of hemorrhoids.A tender Pilonidal cyst like growth at the right side of anus   Genitourinary: Positive for urgency and frequency.       Incontinent from time to time. Followed by Dr. MaMatilde Sprang   Musculoskeletal: Positive for back pain. Negative for neck pain.  Skin: Negative for rash.  Neurological: Positive for weakness.       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 Recurrent CVA on 04/30/13 of the left MCA area Left facial weakness  Psychiatric/Behavioral: Positive for memory loss. Negative for depression. The patient is not nervous/anxious and does not have insomnia.     Past Medical History  Diagnosis Date  . Hypertension   . Coronary artery disease   . Arthritis   . Cataract   . Anxiety   . Blood transfusion without reported diagnosis   . Hyperlipidemia   . Heart murmur   . Allergy   . Thyroid disease   . PAF (paroxysmal atrial fibrillation) (HCC)     coumadin  . History of abdominal aortic aneurysm   . History of echocardiogram 12/2008    EF >55%; mild concentric LVH; mild MR; mild-mod TR; mild AV regurg;   . History of nuclear stress test 12/2010    lexiscan; low risk; compared to prior study, perfusion improved  . History of Doppler ultrasound 05/22/2012    LEAs; R anterior tibial artery appeared occluded; L posterior tibial shows short segment of occlusive ds  . History of Doppler ultrasound 05/22/2012    Abdominal Aortic Doppler; slight increase in fusiform aneurysm   . [place]  . Adjustment disorder with mixed anxiety and depressed mood 04/18/2013    Post CVA depression and anxiety   .  Abnormality of gait 04/18/2013  . 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 Increased exposure of the right cornea.   . Pruritic condition 01/24/2013  . Memory change 01/24/2013  . Abnormal PFTs 10/30/2012    Followed in Pulmonary clinic/  Healthcare/ [place]  - PFT's 10/22/12 VC  55% no obst, DLCO 50%  - PFT's 12/20/2012 VC 83% and no obst, dLCO 55%  -11/08/2012  Walked RA x 3 laps @ 185 ft each stopped due to  End of study, not desat -11/08/12 esr 10    . Impotence of organic origin 10/03/2000  .  Osteoarthrosis, unspecified whether generalized or localized, unspecified site 02/05/2003  . Hypertrophy of prostate with urinary obstruction and other lower urinary tract symptoms (LUTS) 04/28/2004  . Gout, unspecified 11/03/2004  . Acute on chronic systolic congestive heart failure, NYHA class 2 ([place]) 03/16/2006    BNP 376.9 05/28/13   . Atherosclerosis of renal artery ([place]) 10/18/2006  . Unspecified vitamin D deficiency 10/18/2006  . Obstructive sleep apnea (adult) (pediatric) 07/23/2008  . Major depressive disorder, single episode, unspecified ([place]) 03/05/2009  . Basal cell carcinoma of skin of other and unspecified parts of face 12/24/2009  . Internal hemorrhoids without mention of complication 27/05/5007  . Muscle weakness (generalized) 03/10/2011  . Pain in joint, lower leg 08/04/2011  . Nocturia 10/27/2011  . Aortic aneurysm of unspecified site without mention of rupture 10/27/2011  . Fall 03/04/2011  . Hyponatremia 03/04/2011  . TIA (transient ischemic attack) 04/30/2013  . Fatigue 04/18/2013  . 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 CVA - Rt brain stroke 12/14 02/18/2013    02/19/13 angiography CT head:  Diffuse atherosclerotic irregularity and plaque formation of the distal right common carotid artery and proximal internal carotid artery without significant stenosis. Plaque ulceration is present and is a source of emboli. A small right thalamic CVA    . Dyslipidemia 11/03/2004  . Type II or unspecified type diabetes mellitus without mention of complication, not stated as uncontrolled 11/24/2004  . Spinal stenosis in cervical region 12/15/2004  . Right bundle branch block 04/19/2006  . Cardiac pacemaker in situ- MDT 10/11 03/18/2010    Medtronic revo implant in October 2011. Severe sinus bradycardia in the 30s, but not truly pacemaker dependent   . Long term (current) use of anticoagulants 03/17/2011  . Hypothyroidism 03/04/2011  . HTN  (hypertension) 03/04/2011  . Atrial fibrillation ([place]) 03/04/2011     He was on coumadin until his stroke last December and was switched to apixaban 2.'5mg'$  daily which he has been taking faithfully without reported side effects.    . SSS (sick sinus syndrome) ([place]) 04/08/2014  . DM (diabetes mellitus), type 2 with renal complications ([place]) 3/81/8299  . CKD stage 2 due to type 2 diabetes mellitus ([place]) 07/03/2014  . Osteoarthritis of both knees 07/31/2014  . Quadriceps weakness 07/31/2014  . Balance disorder 07/31/2014  . Open wound of knee, leg (except thigh), and ankle, complicated 3/71/6967    Patient Active Problem List   Diagnosis Date Noted  . Pilonidal cyst 04/09/2015  . AAA (abdominal aortic aneurysm) ([place]) 04/07/2015  . Chalazion of right lower eyelid 10/24/2014  . Right heart failure ([place]) 09/03/2014  . Osteoarthritis of both knees 07/31/2014  . Quadriceps weakness 07/31/2014  . Balance disorder 07/31/2014  . Open wound of knee, leg (except thigh), and ankle, complicated 89/38/1017  . Dyspnea 07/03/2014  .  DM (diabetes mellitus), type 2 with renal complications (HCC) 07/03/2014  . CKD stage 2 due to type 2 diabetes mellitus (HCC) 07/03/2014  . Lower back pain 05/29/2014  . SSS (sick sinus syndrome) (HCC) 04/08/2014  . Constipation 03/27/2014  . Soreness of tongue 03/27/2014  . Fecal incontinence 02/17/2014  . Chronic systolic congestive heart failure (HCC) 02/17/2014  . Urgency of urination 02/13/2014  . Hemorrhoid 02/13/2014  . Cerebral infarction due to embolism of cerebral artery (HCC) 01/06/2014  . UTI (urinary tract infection) 12/26/2013  . GERD (gastroesophageal reflux disease) 10/24/2013  . Epistaxis 10/22/2013  . Dysphagia, unspecified(787.20) 10/16/2013  . Cough 09/12/2013  . Foot spasms 09/12/2013  . Rhinorrhea 08/15/2013  . Edema 06/01/2013  . TIA (transient ischemic attack) 04/30/2013  . History of ischemic left MCA stroke 04/30/2013  . Speech and language  deficit due to old cerebral infarction 04/18/2013  . Abnormality of gait 04/18/2013  . Adjustment disorder with mixed anxiety and depressed mood 04/18/2013  . Spell of generalized weakness 04/18/2013  . 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 CVA - Rt brain stroke 12/14 02/18/2013  . Memory change 01/24/2013  . Pruritic condition 01/24/2013  . Abnormal PFTs 10/30/2012  . Allergic rhinitis 09/13/2012  . Aortic aneurysm of unspecified site without mention of rupture 10/27/2011  . Nocturia 10/27/2011  . Pain in left knee 08/04/2011  . Long term current use of anticoagulant therapy 03/17/2011  . Muscle weakness (generalized) 03/10/2011  . Atrial fibrillation (HCC) 03/04/2011  . Fall 03/04/2011  . HTN (hypertension) 03/04/2011  . Hypothyroidism 03/04/2011  . Internal hemorrhoids without mention of complication 02/03/2011  . Cardiac pacemaker in situ- MDT 10/11 03/18/2010  . Basal cell carcinoma of skin of other and unspecified parts of face 12/24/2009  . Other specified cardiac dysrhythmias(427.89) 12/24/2009  . Unspecified sinusitis (chronic) 07/01/2009  . Major depressive disorder, single episode, unspecified (HCC) 03/05/2009  . Osteoarthrosis, unspecified whether generalized or localized, lower leg 01/19/2009  . Syncope and collapse 01/19/2009  . Obstructive sleep apnea (adult) (pediatric) 07/23/2008  . Unspecified vitamin D deficiency 10/18/2006  . Atherosclerosis of renal artery (HCC) 10/18/2006  . Pain in joint, shoulder region 10/18/2006  . Right bundle branch block 04/19/2006  . Urinary frequency 03/16/2006  . Spinal stenosis in cervical region 12/15/2004  . Type 2 diabetes mellitus with circulatory disorder (HCC) 11/24/2004  . Dyslipidemia 11/03/2004  . Gout 11/03/2004  . Coronary atherosclerosis 11/03/2004  . Enlarged prostate with lower urinary tract symptoms (LUTS) 04/28/2004  . Osteoarthritis 02/05/2003  . Impotence of organic origin  10/03/2000    Allergies  Allergen Reactions  . Quinolones Other (See Comments)    Risk of rupture of AAA  . Caduet [Amlodipine-Atorvastatin] Other (See Comments)    Weakness   . Crestor [Rosuvastatin] Other (See Comments)    Muscle pain/weakness  . Lipitor [Atorvastatin Calcium] Other (See Comments)    Muscle pain/weakness  . Simvastatin Other (See Comments)    Muscle pain/weakness  . Lisinopril Other (See Comments)    Doesn't remember reaction    Medications: Patient's Medications  New Prescriptions   No medications on file  Previous Medications   ACETAMINOPHEN (TYLENOL) 325 MG TABLET    Take 650 mg by mouth every morning.    ALLOPURINOL (ZYLOPRIM) 100 MG TABLET    Take 50 mg by mouth daily.    AMOXICILLIN (AMOXIL) 500 MG CAPSULE    Take 500 mg by mouth. Take 4 caps prior to  dental procedures   APIXABAN (ELIQUIS) 2.5 MG TABS TABLET    Take 1 tablet (2.5 mg total) by mouth 2 (two) times daily.   CARBOXYMETHYLCELLULOSE (REFRESH PLUS) 0.5 % SOLN    Place 1 drop into both eyes every evening.   CHOLECALCIFEROL (VITAMIN D) 400 UNITS CAPSULE    Take 400 Units by mouth 2 (two) times daily.    FUROSEMIDE (LASIX) 20 MG TABLET    Take 20 mg by mouth daily as needed for fluid or edema (takes '20mg'$  if gain >3 lbs in a day, >5 lbs in a week). Reported on 04/16/2015   FUROSEMIDE (LASIX) 40 MG TABLET    Take 40 mg by mouth 2 (two) times daily.    HYDROCORTISONE CREAM 1 %    Apply to hemorrhoids twice daily   IPRATROPIUM (ATROVENT) 0.06 % NASAL SPRAY    One puff in each nostril before each meal   LEVOTHYROXINE (SYNTHROID, LEVOTHROID) 125 MCG TABLET    Take 125 mcg by mouth daily before breakfast.   LORAZEPAM (ATIVAN) 0.5 MG TABLET    Take 0.25 mg by mouth at bedtime. Take 1 tab by mouth every four hours as needed   LOSARTAN (COZAAR) 100 MG TABLET    Take 1 tablet (100 mg total) by mouth daily.   METOPROLOL (LOPRESSOR) 100 MG TABLET    Take 100 mg by mouth 2 (two) times daily.   NITROGLYCERIN  (NITROSTAT) 0.4 MG SL TABLET    Place 0.4 mg under the tongue every 5 (five) minutes as needed for chest pain.    NUTRITIONAL SUPPLEMENTS (BOOST COMPACT) LIQD    Take 1 Container by mouth daily. 237 mls daily   OLOPATADINE (PATANOL) 0.1 % OPHTHALMIC SOLUTION    Place 1 drop into both eyes 2 (two) times daily.   POLYETHYLENE GLYCOL (MIRALAX / GLYCOLAX) PACKET    Take 17 g by mouth daily.   POLYVINYL ALCOHOL (LIQUIFILM TEARS) 1.4 % OPHTHALMIC SOLUTION    Place 1 drop into both eyes. One drop in both eyes twice daily as needed for dry eyes   PROTEIN SUPPLEMENT (RESOURCE BENEPROTEIN) POWD    Take 1 scoop by mouth daily as needed.   TAMSULOSIN (FLOMAX) 0.4 MG CAPS CAPSULE    Take one cap by mouth daily   TOBRAMYCIN-DEXAMETHASONE (TOBRADEX) OPHTHALMIC OINTMENT    Place 1 application into both eyes 2 (two) times daily.   WHEAT DEXTRIN (BENEFIBER PO)    Take 15 mLs by mouth daily.   Modified Medications   No medications on file  Discontinued Medications   No medications on file    Physical Exam: Filed Vitals:   04/16/15 1100  BP: 132/70  Pulse: 66  Temp: 96.7 F (35.9 C)  TempSrc: Oral  Resp: 18  Height: '5\' 8"'$  (1.727 m)  Weight: 155 lb (70.308 kg)   Body mass index is 23.57 kg/(m^2).  Physical Exam  Constitutional: He is oriented to person, place, and time. He appears well-developed and well-nourished. No distress.  HENT:  Severe hearing loss. Bilateral aides. Right facial droop.   Eyes:  Corrective lenses. Increased exposure of the right cornea due to post CVA facial droop. Irregular pupil on the left  Neck: No JVD present. No tracheal deviation present. No thyromegaly present.  Cardiovascular: Normal rate and normal heart sounds.  Frequent extrasystoles are present.  No murmur heard. Pacemaker left upper chest  Pulmonary/Chest: Effort normal. He has no wheezes. He has rales.  dry rales posterior lung bases.  Abdominal: Soft. Bowel sounds are normal. He exhibits no distension and  no mass. There is no tenderness.  Genitourinary:  .A tender Pilonidal cyst like growth at the right side of anus  Musculoskeletal: Normal range of motion. He exhibits edema. He exhibits no tenderness.  Unstable gait. Using 4 wheel walker and w/c. Weaker on the right side. Hx of the right total knee replacement. Trace edema BLE, R>L  Lymphadenopathy:    He has no cervical adenopathy.  Neurological: He is alert and oriented to person, place, and time. He displays normal reflexes. No cranial nerve deficit. He exhibits normal muscle tone. Coordination normal.  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 Recurrent CVA on 04/30/13 of the left MCA area Left facial weakness  Skin: Skin is warm and dry. No rash noted. No erythema. No pallor.  Psychiatric: He has a normal mood and affect. His behavior is normal. Judgment and thought content normal.  Nursing note and vitals reviewed.   Labs reviewed: Basic Metabolic Panel:  Recent Labs  12/09/14 12/24/14 01/16/15  NA 135* 138 139  K 4.0 4.0 3.8  BUN 24* 27* 25*  CREATININE 1.0 0.9 1.0    Liver Function Tests:  Recent Labs  05/30/14 09/16/14 01/16/15  AST '14 17 22  '$ ALT '10 12 11  '$ ALKPHOS 93 87 104    CBC:  Recent Labs  09/16/14 11/25/14 01/16/15  WBC 5.7 7.2 7.1  HGB 14.3 15.0 16.0  HCT 42 44 48  PLT 223 200 180    Lab Results  Component Value Date   TSH 3.19 02/19/2015   Lab Results  Component Value Date   HGBA1C 6.5* 08/29/2013   Lab Results  Component Value Date   CHOL 117 05/01/2013   HDL 30* 05/01/2013   LDLCALC 71 05/01/2013   TRIG 79 05/01/2013   CHOLHDL 3.9 05/01/2013    Significant Diagnostic Results since last visit: none  Patient Care Team: [name], MD as PCP - General (Internal Medicine) [facility] Pixie Casino, MD as Consulting Physician (Cardiology) Ardis Hughs, MD as Attending Physician (Urology)  Assessment/Plan Problem  List Items Addressed This Visit    Atrial fibrillation ([place]) - Primary (Chronic)    Rate controlled, continue Metoprolol '100mg'$  bid. Eliquis for VTE risk reduction.       HTN (hypertension) (Chronic)    Controlled, continue Metoprolol 100 mg twice a day, Losartan '100mg'$ , Furosemide '40mg'$  bid.      Hypothyroidism (Chronic)    09/16/14 TSH 2.965 01/15/15 7.202 02/19/15 TSH 3.194 Continue Levothyroxine 136mg      Chronic systolic congestive heart failure (HCC) (Chronic)    compensated clinically, continue Furosemide '40mg'$  bid, prn Furosemide. Noted some nonproductive cough at night, but denied paroxysmal nocturnal orthopnea.       Lower back pain (Chronic)    Tolerable, continue Tylenol '650mg'$  qam       CKD stage 2 due to type 2 diabetes mellitus (HCC) (Chronic)    12/09/14 Bun/creat 24/0.95 01/16/15 Bun/creat 25/0.97      Gout    No flare ups. continue Allopurinol '50mg'$  daily.      Enlarged prostate with lower urinary tract symptoms (LUTS)    Baseline urinary frequency, continue Tamsulosin 0.'4mg'$       Constipation    Stable, continue Benefiber and prn MiraLax      AAA (abdominal aortic aneurysm) ([facility]    F/u cardiology          Family/  staff Communication: surgeon referral pending.   Labs/tests ordered: none  ManXie Vivan Agostino NP [facility] [address], [place] [address] On Call:  [phone] & follow prompts after 5pm & weekends Office Phone:  [phone] Office Fax:  [phone]

## 2015-05-14 ENCOUNTER — Non-Acute Institutional Stay: Payer: Medicare Other | Admitting: Nurse Practitioner

## 2015-05-14 ENCOUNTER — Encounter: Payer: Self-pay | Admitting: Nurse Practitioner

## 2015-05-14 DIAGNOSIS — K5900 Constipation, unspecified: Secondary | ICD-10-CM

## 2015-05-14 DIAGNOSIS — M1000 Idiopathic gout, unspecified site: Secondary | ICD-10-CM

## 2015-05-14 DIAGNOSIS — I5022 Chronic systolic (congestive) heart failure: Secondary | ICD-10-CM

## 2015-05-14 DIAGNOSIS — E039 Hypothyroidism, unspecified: Secondary | ICD-10-CM

## 2015-05-14 DIAGNOSIS — I480 Paroxysmal atrial fibrillation: Secondary | ICD-10-CM

## 2015-05-14 DIAGNOSIS — N182 Chronic kidney disease, stage 2 (mild): Secondary | ICD-10-CM | POA: Diagnosis not present

## 2015-05-14 DIAGNOSIS — E1122 Type 2 diabetes mellitus with diabetic chronic kidney disease: Secondary | ICD-10-CM

## 2015-05-14 DIAGNOSIS — M5440 Lumbago with sciatica, unspecified side: Secondary | ICD-10-CM | POA: Diagnosis not present

## 2015-05-14 DIAGNOSIS — I10 Essential (primary) hypertension: Secondary | ICD-10-CM | POA: Diagnosis not present

## 2015-05-14 DIAGNOSIS — N401 Enlarged prostate with lower urinary tract symptoms: Secondary | ICD-10-CM | POA: Diagnosis not present

## 2015-05-14 NOTE — Assessment & Plan Note (Signed)
Stable, continue Benefiber and prn MiraLax 

## 2015-05-14 NOTE — Assessment & Plan Note (Signed)
Tolerable, continue Tylenol 650mg  qam

## 2015-05-14 NOTE — Assessment & Plan Note (Signed)
09/16/14 TSH 2.965 01/15/15 7.202 02/19/15 TSH 3.194 Continue Levothyroxine 120mcg

## 2015-05-14 NOTE — Assessment & Plan Note (Signed)
Controlled, continue Metoprolol 100 mg twice a day, Losartan 100mg , Furosemide 40mg  bid.

## 2015-05-14 NOTE — Assessment & Plan Note (Signed)
Baseline urinary frequency, continue Tamsulosin 0.4mg 

## 2015-05-14 NOTE — Assessment & Plan Note (Signed)
12/09/14 Bun/creat 24/0.95 01/16/15 Bun/creat 25/0.97

## 2015-05-14 NOTE — Progress Notes (Signed)
Patient ID: Jon [name], male   DOB: [birth year], 82 y.o.   MRN: [id number]  Location:  [place] Room Number: 782 Place of Service: [facility] Provider:  Lennie Odor Patrece Tallie NP  [name], MD  Patient Care Team: [name], MD as PCP - General (Internal Medicine) [facility] Pixie Casino, MD as Consulting Physician (Cardiology) Ardis Hughs, MD as Attending Physician (Urology)  Extended Emergency Contact Information Primary Emergency Contact: [name] of [place] Phone: [phone] Relation: Daughter Secondary Emergency Contact: Eyvonne Mechanic [place] of [place] Phone: [phone] Mobile Phone: [phone] Relation: Daughter  Code Status:  DNR Goals of care: Advanced Directive information Advanced Directives 05/14/2015  Does patient have an advance directive? Yes  Type of Advance Directive [place]  Does patient want to make changes to advanced directive? No - Patient declined  Copy of advanced directive(s) in chart? Yes     Chief Complaint  Patient presents with  . Medical Management of Chronic Issues    Routine Visit    HPI:  Pt is a 82 y.o. male seen today for medical management of chronic diseases.  Hx of hypothyroidism, takingLevothyroxine 187mg, TSH wnl 3.19 02/19/15. CVA, CHF, HTN, anxiety, urinary frequency.   CHF: less SOB, but still DOE, moist rales back of lungs, on Furosemide '40mg'$  bid.   Hypothyroidism, taking Levothyroxine 1234m, last TSH wnl  BPH: no urinary retention, but dribbling, taking Tamsulosin 0.'4mg'$  daily  Gout: no flare ups, taking Allopurinol '100mg'$   Afib/late effect of CVA: heart rate is in control, left facial weakness, ambulates with walker, taking Eliquis   Past Medical History  Diagnosis Date  . Hypertension   . Coronary artery disease   . Arthritis   . Cataract   . Anxiety   . Blood transfusion without reported diagnosis     . Hyperlipidemia   . Heart murmur   . Allergy   . Thyroid disease   . PAF (paroxysmal atrial fibrillation) (HCC)     coumadin  . History of abdominal aortic aneurysm   . History of echocardiogram 12/2008    EF >55%; mild concentric LVH; mild MR; mild-mod TR; mild AV regurg;   . History of nuclear stress test 12/2010    lexiscan; low risk; compared to prior study, perfusion improved  . History of Doppler ultrasound 05/22/2012    LEAs; R anterior tibial artery appeared occluded; L posterior tibial shows short segment of occlusive ds  . History of Doppler ultrasound 05/22/2012    Abdominal Aortic Doppler; slight increase in fusiform aneurysm   . Stroke ([facility]  . Adjustment disorder with mixed anxiety and depressed mood 04/18/2013    Post CVA depression and anxiety   . Abnormality of gait 04/18/2013  . 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 Increased exposure of the right cornea.   . Pruritic condition 01/24/2013  . Memory change 01/24/2013  . Abnormal PFTs 10/30/2012    Followed in Pulmonary clinic/ [facility]/ [place]  - PFT's 10/22/12 VC  55% no obst, DLCO 50%  - PFT's 12/20/2012 VC 83% and no obst, dLCO 55%  -11/08/2012  Walked RA x 3 laps @ 185 ft each stopped due to  End of study, not desat -11/08/12 esr 10    . Impotence of organic origin 10/03/2000  . Osteoarthrosis, unspecified whether generalized or localized, unspecified site 02/05/2003  . Hypertrophy of prostate with urinary obstruction and  other lower urinary tract symptoms (LUTS) 04/28/2004  . Gout, unspecified 11/03/2004  . Acute on chronic systolic congestive heart failure, NYHA class 2 ([place]) 03/16/2006    BNP 376.9 05/28/13   . Atherosclerosis of renal artery ([place]) 10/18/2006  . Unspecified vitamin D deficiency 10/18/2006  . Obstructive sleep apnea (adult) (pediatric) 07/23/2008  . Major depressive disorder, single episode, unspecified ([place]) 03/05/2009  . Basal cell carcinoma of skin of other and unspecified  parts of face 12/24/2009  . Internal hemorrhoids without mention of complication 91/47/8295  . Muscle weakness (generalized) 03/10/2011  . Pain in joint, lower leg 08/04/2011  . Nocturia 10/27/2011  . Aortic aneurysm of unspecified site without mention of rupture 10/27/2011  . Fall 03/04/2011  . Hyponatremia 03/04/2011  . TIA (transient ischemic attack) 04/30/2013  . Fatigue 04/18/2013  . 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 CVA - Rt brain stroke 12/14 02/18/2013    02/19/13 angiography CT head:  Diffuse atherosclerotic irregularity and plaque formation of the distal right common carotid artery and proximal internal carotid artery without significant stenosis. Plaque ulceration is present and is a source of emboli. A small right thalamic CVA    . Dyslipidemia 11/03/2004  . Type II or unspecified type diabetes mellitus without mention of complication, not stated as uncontrolled 11/24/2004  . Spinal stenosis in cervical region 12/15/2004  . Right bundle branch block 04/19/2006  . Cardiac pacemaker in situ- MDT 10/11 03/18/2010    Medtronic revo implant in October 2011. Severe sinus bradycardia in the 30s, but not truly pacemaker dependent   . Long term (current) use of anticoagulants 03/17/2011  . Hypothyroidism 03/04/2011  . HTN (hypertension) 03/04/2011  . Atrial fibrillation ([place]) 03/04/2011     He was on coumadin until his stroke last December and was switched to apixaban 2.'5mg'$  daily which he has been taking faithfully without reported side effects.    . SSS (sick sinus syndrome) ([place]) 04/08/2014  . DM (diabetes mellitus), type 2 with renal complications ([place]) 08/25/3084  . CKD stage 2 due to type 2 diabetes mellitus ([place]) 07/03/2014  . Osteoarthritis of both knees 07/31/2014  . Quadriceps weakness 07/31/2014  . Balance disorder 07/31/2014  . Open wound of knee, leg (except thigh), and ankle, complicated 5/78/4696   Past Surgical History    Procedure Laterality Date  . Coronary artery bypass graft  1989  . Pacemaker insertion  2011  . Insert / replace / remove pacemaker    . Eye surgery    . Joint replacement    . Hernia repair    . Coronary angioplasty with stent placement  2008    stent to SVG to OM    Allergies  Allergen Reactions  . Quinolones Other (See Comments)    Risk of rupture of AAA  . Caduet [Amlodipine-Atorvastatin] Other (See Comments)    Weakness   . Crestor [Rosuvastatin] Other (See Comments)    Muscle pain/weakness  . Lipitor [Atorvastatin Calcium] Other (See Comments)    Muscle pain/weakness  . Simvastatin Other (See Comments)    Muscle pain/weakness  . Lisinopril Other (See Comments)    Doesn't remember reaction      Medication List       This list is accurate as of: 05/14/15 12:26 PM.  Always use your most recent med list.               acetaminophen 325 MG tablet  Commonly  known as:  TYLENOL  Take 650 mg by mouth every morning.     allopurinol 100 MG tablet  Commonly known as:  ZYLOPRIM  Take 50 mg by mouth daily.     amoxicillin 500 MG capsule  Commonly known as:  AMOXIL  Take 500 mg by mouth. Take 4 caps prior to dental procedures     apixaban 2.5 MG Tabs tablet  Commonly known as:  ELIQUIS  Take 1 tablet (2.5 mg total) by mouth 2 (two) times daily.     BOOST COMPACT Liqd  Take 1 Container by mouth daily. 237 mls daily     carboxymethylcellulose 0.5 % Soln  Commonly known as:  REFRESH PLUS  Place 1 drop into both eyes every evening.     furosemide 40 MG tablet  Commonly known as:  LASIX  Take 40 mg by mouth 2 (two) times daily.     furosemide 20 MG tablet  Commonly known as:  LASIX  Take 20 mg by mouth daily as needed for fluid or edema (takes '20mg'$  if gain >3 lbs in a day, >5 lbs in a week). Reported on 04/16/2015     ipratropium 0.06 % nasal spray  Commonly known as:  ATROVENT  One puff in each nostril before each meal     levothyroxine 125 MCG tablet   Commonly known as:  SYNTHROID, LEVOTHROID  Take 125 mcg by mouth daily before breakfast.     LORazepam 0.5 MG tablet  Commonly known as:  ATIVAN  Take 0.25 mg by mouth at bedtime. Take 1 tab by mouth every four hours as needed     losartan 100 MG tablet  Commonly known as:  COZAAR  Take 1 tablet (100 mg total) by mouth daily.     metoprolol 100 MG tablet  Commonly known as:  LOPRESSOR  Take 100 mg by mouth 2 (two) times daily.     nitroGLYCERIN 0.4 MG SL tablet  Commonly known as:  NITROSTAT  Place 0.4 mg under the tongue every 5 (five) minutes as needed for chest pain.     olopatadine 0.1 % ophthalmic solution  Commonly known as:  PATANOL  Place 1 drop into both eyes 2 (two) times daily.     polyethylene glycol packet  Commonly known as:  MIRALAX / GLYCOLAX  Take 17 g by mouth daily.     polyvinyl alcohol 1.4 % ophthalmic solution  Commonly known as:  LIQUIFILM TEARS  Place 1 drop into both eyes. One drop in both eyes twice daily as needed for dry eyes     protein supplement Powd  Take 1 scoop by mouth daily as needed.     tamsulosin 0.4 MG Caps capsule  Commonly known as:  FLOMAX  Take one cap by mouth daily     tobramycin-dexamethasone ophthalmic ointment  Commonly known as:  TOBRADEX  Place 1 application into both eyes 2 (two) times daily.     Vitamin D 400 units capsule  Take 400 Units by mouth 2 (two) times daily.        Review of Systems  Constitutional: Negative for fever.       Generalized weakness  HENT: Positive for congestion and hearing loss. Negative for ear discharge, ear pain and tinnitus.   Eyes: Negative.   Respiratory: Positive for cough and shortness of breath. Negative for wheezing.   Cardiovascular: Positive for leg swelling (sometimes, not apparent today. ). Negative for palpitations.       Trace  edema BLE, R>L  Gastrointestinal: Negative for abdominal pain and diarrhea.       Chokes on swallowing since his CVA. Episodes of fecal  incontinence. Hx of hemorrhoids.A tender Pilonidal cyst like growth at the right side of anus   Genitourinary: Positive for urgency and frequency.       Incontinent from time to time. Followed by Dr. Matilde Sprang.  Musculoskeletal: Positive for back pain. Negative for neck pain.  Skin: Negative for rash.  Neurological: Positive for weakness.       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 Recurrent CVA on 04/30/13 of the left MCA area Left facial weakness  Psychiatric/Behavioral: The patient is not nervous/anxious.     Immunization History  Administered Date(s) Administered  . Influenza Whole 12/06/2011, 12/19/2012  . Influenza-Unspecified 01/09/2014, 11/25/2014  . PPD Test 05/03/2013  . Pneumococcal Polysaccharide-23 03/07/1986  . Td 04/20/2005   Pertinent  Health Maintenance Due  Topic Date Due  . OPHTHALMOLOGY EXAM  01/12/1934  . PNA vac Low Risk Adult (1 of 2 - PCV13) 01/12/1989  . HEMOGLOBIN A1C  02/28/2014  . FOOT EXAM  07/31/2015  . INFLUENZA VACCINE  10/06/2015   Fall Risk  10/23/2014 07/07/2014 02/17/2014 09/13/2012  Falls in the past year? Yes Yes Exclusion - non ambulatory No  Number falls in past yr: 1 1 - -  Injury with Fall? - No - -  Risk for fall due to : - History of fall(s);Impaired mobility;Impaired balance/gait - -  Follow up - Falls evaluation completed;Falls prevention discussed - -   Functional Status Survey:    Filed Vitals:   05/14/15 0924  BP: 152/77  Pulse: 65  Temp: 98.1 F (36.7 C)  TempSrc: Oral  Resp: 22  Height: '5\' 8"'$  (1.727 m)  Weight: 155 lb (70.308 kg)   Body mass index is 23.57 kg/(m^2). Physical Exam  Constitutional: He is oriented to person, place, and time. He appears well-developed and well-nourished. No distress.  HENT:  Severe hearing loss. Bilateral aides. Right facial droop.   Eyes:  Corrective lenses. Increased exposure of the right cornea due to post CVA facial droop. Irregular  pupil on the left  Neck: No JVD present. No tracheal deviation present. No thyromegaly present.  Cardiovascular: Normal rate and normal heart sounds.  Frequent extrasystoles are present.  No murmur heard. Pacemaker left upper chest  Pulmonary/Chest: Effort normal. He has no wheezes. He has rales.  dry rales posterior lung bases.   Abdominal: Soft. Bowel sounds are normal. He exhibits no distension and no mass. There is no tenderness.  Genitourinary:  .A tender Pilonidal cyst like growth at the right side of anus  Musculoskeletal: Normal range of motion. He exhibits edema. He exhibits no tenderness.  Unstable gait. Using 4 wheel walker and w/c. Weaker on the right side. Hx of the right total knee replacement. Trace edema BLE, R>L. C/o left knee pain, f/u Ortho. Motorized w/c to go further  Lymphadenopathy:    He has no cervical adenopathy.  Neurological: He is alert and oriented to person, place, and time. He displays normal reflexes. No cranial nerve deficit. He exhibits normal muscle tone. Coordination normal.  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 Recurrent CVA on 04/30/13 of the left MCA area Left facial weakness  Skin: Skin is warm and dry. No rash noted. No erythema. No pallor.  Psychiatric: He has a normal mood and affect. His behavior is normal. Judgment and thought  content normal.  Nursing note and vitals reviewed.   Labs reviewed:  Recent Labs  12/09/14 12/24/14 01/16/15  NA 135* 138 139  K 4.0 4.0 3.8  BUN 24* 27* 25*  CREATININE 1.0 0.9 1.0    Recent Labs  05/30/14 09/16/14 01/16/15  AST '14 17 22  '$ ALT '10 12 11  '$ ALKPHOS 93 87 104    Recent Labs  09/16/14 11/25/14 01/16/15  WBC 5.7 7.2 7.1  HGB 14.3 15.0 16.0  HCT 42 44 48  PLT 223 200 180   Lab Results  Component Value Date   TSH 3.19 02/19/2015   Lab Results  Component Value Date   HGBA1C 6.5* 08/29/2013   Lab Results  Component Value Date   CHOL 117  05/01/2013   HDL 30* 05/01/2013   LDLCALC 71 05/01/2013   TRIG 79 05/01/2013   CHOLHDL 3.9 05/01/2013    Significant Diagnostic Results in last 30 days:  No results found.  Assessment/Plan  Atrial fibrillation Rate controlled, continue Metoprolol '100mg'$  bid. Eliquis for VTE risk reduction.   Chronic systolic congestive heart failure ([place]) compensated clinically, continue Furosemide '40mg'$  bid, prn Furosemide. Noted some nonproductive cough at night, but denied paroxysmal nocturnal orthopnea, rales posterior lung bases.   CKD stage 2 due to type 2 diabetes mellitus ([place]) 12/09/14 Bun/creat 24/0.95 01/16/15 Bun/creat 25/0.97  Constipation Stable, continue Benefiber and prn MiraLax  Enlarged prostate with lower urinary tract symptoms (LUTS) Baseline urinary frequency, continue Tamsulosin 0.'4mg'$   Gout No flare ups. continue Allopurinol '50mg'$  daily.  HTN (hypertension) Controlled, continue Metoprolol 100 mg twice a day, Losartan '100mg'$ , Furosemide '40mg'$  bid.  Hypothyroidism 09/16/14 TSH 2.965 01/15/15 7.202 02/19/15 TSH 3.194 Continue Levothyroxine 157mg  Lower back pain Tolerable, continue Tylenol '650mg'$  qam    Family/ staff Communication: continue AL for care needs, mobility, walker for short distance and motorized w/c to go further.   Labs/tests ordered:  none

## 2015-05-14 NOTE — Assessment & Plan Note (Signed)
compensated clinically, continue Furosemide 40mg  bid, prn Furosemide. Noted some nonproductive cough at night, but denied paroxysmal nocturnal orthopnea, rales posterior lung bases.

## 2015-05-14 NOTE — Assessment & Plan Note (Signed)
No flare ups. continue Allopurinol 50mg daily.  

## 2015-05-14 NOTE — Assessment & Plan Note (Signed)
Rate controlled, continue Metoprolol 100mg bid. Eliquis for VTE risk reduction.  

## 2015-05-19 DIAGNOSIS — M25562 Pain in left knee: Secondary | ICD-10-CM | POA: Diagnosis not present

## 2015-05-19 DIAGNOSIS — M1712 Unilateral primary osteoarthritis, left knee: Secondary | ICD-10-CM | POA: Diagnosis not present

## 2015-05-21 ENCOUNTER — Non-Acute Institutional Stay: Payer: Medicare Other | Admitting: Internal Medicine

## 2015-05-21 ENCOUNTER — Encounter: Payer: Self-pay | Admitting: Internal Medicine

## 2015-05-21 VITALS — BP 144/82 | HR 78 | Temp 97.5°F | Ht 68.0 in | Wt 159.0 lb

## 2015-05-21 DIAGNOSIS — E785 Hyperlipidemia, unspecified: Secondary | ICD-10-CM

## 2015-05-21 DIAGNOSIS — I10 Essential (primary) hypertension: Secondary | ICD-10-CM

## 2015-05-21 DIAGNOSIS — I639 Cerebral infarction, unspecified: Secondary | ICD-10-CM | POA: Diagnosis not present

## 2015-05-21 DIAGNOSIS — N183 Chronic kidney disease, stage 3 unspecified: Secondary | ICD-10-CM

## 2015-05-21 DIAGNOSIS — H02109 Unspecified ectropion of unspecified eye, unspecified eyelid: Secondary | ICD-10-CM

## 2015-05-21 DIAGNOSIS — L723 Sebaceous cyst: Secondary | ICD-10-CM | POA: Diagnosis not present

## 2015-05-21 DIAGNOSIS — Z85828 Personal history of other malignant neoplasm of skin: Secondary | ICD-10-CM | POA: Diagnosis not present

## 2015-05-21 DIAGNOSIS — L821 Other seborrheic keratosis: Secondary | ICD-10-CM | POA: Diagnosis not present

## 2015-05-21 DIAGNOSIS — R32 Unspecified urinary incontinence: Secondary | ICD-10-CM | POA: Diagnosis not present

## 2015-05-21 DIAGNOSIS — D1801 Hemangioma of skin and subcutaneous tissue: Secondary | ICD-10-CM | POA: Diagnosis not present

## 2015-05-21 DIAGNOSIS — R609 Edema, unspecified: Secondary | ICD-10-CM

## 2015-05-21 DIAGNOSIS — D485 Neoplasm of uncertain behavior of skin: Secondary | ICD-10-CM | POA: Diagnosis not present

## 2015-05-21 DIAGNOSIS — R05 Cough: Secondary | ICD-10-CM | POA: Diagnosis not present

## 2015-05-21 DIAGNOSIS — L570 Actinic keratosis: Secondary | ICD-10-CM | POA: Diagnosis not present

## 2015-05-21 DIAGNOSIS — M25562 Pain in left knee: Secondary | ICD-10-CM | POA: Diagnosis not present

## 2015-05-21 DIAGNOSIS — R059 Cough, unspecified: Secondary | ICD-10-CM

## 2015-05-21 DIAGNOSIS — D044 Carcinoma in situ of skin of scalp and neck: Secondary | ICD-10-CM | POA: Diagnosis not present

## 2015-05-21 DIAGNOSIS — I63039 Cerebral infarction due to thrombosis of unspecified carotid artery: Secondary | ICD-10-CM

## 2015-05-21 DIAGNOSIS — C44519 Basal cell carcinoma of skin of other part of trunk: Secondary | ICD-10-CM | POA: Diagnosis not present

## 2015-05-21 DIAGNOSIS — R0600 Dyspnea, unspecified: Secondary | ICD-10-CM

## 2015-05-21 DIAGNOSIS — F0631 Mood disorder due to known physiological condition with depressive features: Secondary | ICD-10-CM

## 2015-05-21 DIAGNOSIS — E1122 Type 2 diabetes mellitus with diabetic chronic kidney disease: Secondary | ICD-10-CM | POA: Diagnosis not present

## 2015-05-21 DIAGNOSIS — IMO0002 Reserved for concepts with insufficient information to code with codable children: Secondary | ICD-10-CM

## 2015-05-21 DIAGNOSIS — E039 Hypothyroidism, unspecified: Secondary | ICD-10-CM

## 2015-05-21 DIAGNOSIS — I5022 Chronic systolic (congestive) heart failure: Secondary | ICD-10-CM

## 2015-05-21 DIAGNOSIS — L565 Disseminated superficial actinic porokeratosis (DSAP): Secondary | ICD-10-CM | POA: Diagnosis not present

## 2015-05-21 NOTE — Progress Notes (Signed)
Patient ID: Jon [name], male   DOB: [birth year], 82 y.o.   MRN: [id number]   Location:  [facility]  Provider: Jeanmarie Hubert, M.D.  Code Status: Full code Goals of Care:  Advanced Directives 05/21/2015  Does patient have an advance directive? Yes  Type of Paramedic of [place];Living will  Does patient want to make changes to advanced directive? -  Copy of advanced directive(s) in chart? Yes     Chief Complaint  Patient presents with  . Medical Management of Chronic Issues    blood sugar, thyroid, blood pressure, A-Fib, depression, here with daughter Izora Gala    HPI: Patient is a 82 y.o. male seen today for medical management of chronic diseases.  Despite a multitude of chronic illnesses, patient actually seems to be making some improvement overall.  Patient had discomfort in the perirectal area. He has a pilonidal cyst that has been evaluated by surgeon. He also has hemorrhoids, but finds relief with the hemorrhoidal creams and been ordered.  Patient has a persistent cough especially at night. It is deep and croupy sounding.  Complains of sleepy feelings much of the time today. He has a history of sleep apnea. He previously used CPAP, but the mask irritating and refuses to wear.  Essential hypertension - controlled  Hypothyroidism, unspecified hypothyroidism type - compensated  Dyslipidemia - controlled  Type 2 diabetes mellitus with stage 3 chronic kidney disease, without long-term current use of insulin (HCC) -   stable  Depression due to stroke [facility]) - improved  Cerebral infarction due to thrombosis of carotid artery (HCC) - residual problems of strength and dysphagia as well as dysarthria  Chronic systolic congestive heart failure ([place]) - compensated. He feels that the edema is increasing.  Dyspnea - chronic, with exertion  Complains of pain in his left knee. He has been seen at [facility] and is receiving injections there.      Past  Medical History  Diagnosis Date  . Hypertension   . Coronary artery disease   . Arthritis   . Cataract   . Anxiety   . Blood transfusion without reported diagnosis   . Hyperlipidemia   . Heart murmur   . Allergy   . Thyroid disease   . PAF (paroxysmal atrial fibrillation) (HCC)     coumadin  . History of abdominal aortic aneurysm   . History of echocardiogram 12/2008    EF >55%; mild concentric LVH; mild MR; mild-mod TR; mild AV regurg;   . History of nuclear stress test 12/2010    lexiscan; low risk; compared to prior study, perfusion improved  . History of Doppler ultrasound 05/22/2012    LEAs; R anterior tibial artery appeared occluded; L posterior tibial shows short segment of occlusive ds  . History of Doppler ultrasound 05/22/2012    Abdominal Aortic Doppler; slight increase in fusiform aneurysm   . Stroke ([place])   . Adjustment disorder with mixed anxiety and depressed mood 04/18/2013    Post CVA depression and anxiety   . Abnormality of gait 04/18/2013  . 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 Increased exposure of the right cornea.   . Pruritic condition 01/24/2013  . Memory change 01/24/2013  . Abnormal PFTs 10/30/2012    Followed in Pulmonary clinic/ [facility]/ [place]  - PFT's 10/22/12 VC  55% no obst, DLCO 50%  - PFT's 12/20/2012 VC 83% and no obst, dLCO 55%  -11/08/2012  Walked RA x 3 laps @  185 ft each stopped due to  End of study, not desat -11/08/12 esr 10    . Impotence of organic origin 10/03/2000  . Osteoarthrosis, unspecified whether generalized or localized, unspecified site 02/05/2003  . Hypertrophy of prostate with urinary obstruction and other lower urinary tract symptoms (LUTS) 04/28/2004  . Gout, unspecified 11/03/2004  . Acute on chronic systolic congestive heart failure, NYHA class 2 ([place]) 03/16/2006    BNP 376.9 05/28/13   . Atherosclerosis of renal artery ([place]) 10/18/2006  . Unspecified vitamin D deficiency 10/18/2006  . Obstructive  sleep apnea (adult) (pediatric) 07/23/2008  . Major depressive disorder, single episode, unspecified ([place]) 03/05/2009  . Basal cell carcinoma of skin of other and unspecified parts of face 12/24/2009  . Internal hemorrhoids without mention of complication 20/35/5974  . Muscle weakness (generalized) 03/10/2011  . Pain in joint, lower leg 08/04/2011  . Nocturia 10/27/2011  . Aortic aneurysm of unspecified site without mention of rupture 10/27/2011  . Fall 03/04/2011  . Hyponatremia 03/04/2011  . TIA (transient ischemic attack) 04/30/2013  . Fatigue 04/18/2013  . 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 CVA - Rt brain stroke 12/14 02/18/2013    02/19/13 angiography CT head:  Diffuse atherosclerotic irregularity and plaque formation of the distal right common carotid artery and proximal internal carotid artery without significant stenosis. Plaque ulceration is present and is a source of emboli. A small right thalamic CVA    . Dyslipidemia 11/03/2004  . Type II or unspecified type diabetes mellitus without mention of complication, not stated as uncontrolled 11/24/2004  . Spinal stenosis in cervical region 12/15/2004  . Right bundle branch block 04/19/2006  . Cardiac pacemaker in situ- MDT 10/11 03/18/2010    Medtronic revo implant in October 2011. Severe sinus bradycardia in the 30s, but not truly pacemaker dependent   . Long term (current) use of anticoagulants 03/17/2011  . Hypothyroidism 03/04/2011  . HTN (hypertension) 03/04/2011  . Atrial fibrillation ([place]) 03/04/2011     He was on coumadin until his stroke last December and was switched to apixaban 2.37m daily which he has been taking faithfully without reported side effects.    . SSS (sick sinus syndrome) ([place] 04/08/2014  . DM (diabetes mellitus), type 2 with renal complications ([place] 41/63/8453 . CKD stage 2 due to type 2 diabetes mellitus ([place] 07/03/2014  . Osteoarthritis of both knees 07/31/2014    . Quadriceps weakness 07/31/2014  . Balance disorder 07/31/2014  . Open wound of knee, leg (except thigh), and ankle, complicated 56/46/8032   Past Surgical History  Procedure Laterality Date  . Coronary artery bypass graft  1989  . Pacemaker insertion  2011  . Insert / replace / remove pacemaker    . Eye surgery    . Joint replacement    . Hernia repair    . Coronary angioplasty with stent placement  2008    stent to SVG to OM    Allergies  Allergen Reactions  . Quinolones Other (See Comments)    Risk of rupture of AAA  . Caduet [Amlodipine-Atorvastatin] Other (See Comments)    Weakness   . Crestor [Rosuvastatin] Other (See Comments)    Muscle pain/weakness  . Lipitor [Atorvastatin Calcium] Other (See Comments)    Muscle pain/weakness  . Simvastatin Other (See Comments)    Muscle pain/weakness  . Lisinopril Other (See Comments)    Doesn't remember reaction  Medication List       This list is accurate as of: 05/21/15  4:36 PM.  Always use your most recent med list.               acetaminophen 325 MG tablet  Commonly known as:  TYLENOL  Take 650 mg by mouth every morning.     allopurinol 100 MG tablet  Commonly known as:  ZYLOPRIM  Take 50 mg by mouth daily.     amoxicillin 500 MG capsule  Commonly known as:  AMOXIL  Take 500 mg by mouth. Take 4 caps prior to dental procedures     apixaban 2.5 MG Tabs tablet  Commonly known as:  ELIQUIS  Take 1 tablet (2.5 mg total) by mouth 2 (two) times daily.     BOOST COMPACT Liqd  Take 1 Container by mouth daily. 237 mls daily     carboxymethylcellulose 0.5 % Soln  Commonly known as:  REFRESH PLUS  Place 1 drop into both eyes every evening.     furosemide 40 MG tablet  Commonly known as:  LASIX  Take 40 mg by mouth 2 (two) times daily.     furosemide 20 MG tablet  Commonly known as:  LASIX  Take 20 mg by mouth daily as needed for fluid or edema (takes 65m if gain >3 lbs in a day, >5 lbs in a week).  Reported on 04/16/2015     ipratropium 0.06 % nasal spray  Commonly known as:  ATROVENT  One puff in each nostril before each meal     levothyroxine 125 MCG tablet  Commonly known as:  SYNTHROID, LEVOTHROID  Take 125 mcg by mouth daily before breakfast.     LORazepam 0.5 MG tablet  Commonly known as:  ATIVAN  Take 0.25 mg by mouth at bedtime. Take 1 tab by mouth every four hours as needed     losartan 100 MG tablet  Commonly known as:  COZAAR  Take 1 tablet (100 mg total) by mouth daily.     metoprolol 100 MG tablet  Commonly known as:  LOPRESSOR  Take 100 mg by mouth 2 (two) times daily.     nitroGLYCERIN 0.4 MG SL tablet  Commonly known as:  NITROSTAT  Place 0.4 mg under the tongue every 5 (five) minutes as needed for chest pain.     olopatadine 0.1 % ophthalmic solution  Commonly known as:  PATANOL  Place 1 drop into both eyes 2 (two) times daily.     polyethylene glycol packet  Commonly known as:  MIRALAX / GLYCOLAX  Take 17 g by mouth daily.     polyvinyl alcohol 1.4 % ophthalmic solution  Commonly known as:  LIQUIFILM TEARS  Place 1 drop into both eyes. One drop in both eyes twice daily as needed for dry eyes     protein supplement Powd  Take 1 scoop by mouth daily as needed.     tamsulosin 0.4 MG Caps capsule  Commonly known as:  FLOMAX  Take one cap by mouth daily     tobramycin-dexamethasone ophthalmic ointment  Commonly known as:  TOBRADEX  Place 1 application into both eyes 2 (two) times daily.     Vitamin D 400 units capsule  Take 400 Units by mouth 2 (two) times daily.        Review of Systems:  Review of Systems  Constitutional: Negative for fever.       Generalized weakness  HENT: Positive for congestion  and hearing loss. Negative for ear discharge, ear pain and tinnitus.   Eyes: Negative.   Respiratory: Positive for cough and shortness of breath. Negative for wheezing.   Cardiovascular: Positive for leg swelling (sometimes, not apparent  today. ). Negative for palpitations.       Trace edema BLE, R>L  Gastrointestinal: Negative for abdominal pain and diarrhea.       Chokes on swallowing since his CVA. Episodes of fecal incontinence. Hx of hemorrhoids.A tender Pilonidal cyst like growth at the right side of anus.  Genitourinary: Positive for urgency and frequency.       Incontinent from time to time. Followed by Dr. Matilde Sprang.  Musculoskeletal: Positive for back pain. Negative for neck pain.  Skin: Negative for rash.  Neurological: Positive for weakness.       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 Recurrent CVA on 04/30/13 of the left MCA area Left facial weakness  Psychiatric/Behavioral: The patient is not nervous/anxious.     Health Maintenance  Topic Date Due  . OPHTHALMOLOGY EXAM  01/12/1932  . ZOSTAVAX  01/13/1984  . PNA vac Low Risk Adult (1 of 2 - PCV13) 01/12/1989  . HEMOGLOBIN A1C  02/28/2014  . TETANUS/TDAP  04/21/2015  . FOOT EXAM  07/31/2015  . INFLUENZA VACCINE  10/06/2015    Physical Exam: Filed Vitals:   05/21/15 1627  BP: 144/82  Pulse: 78  Temp: 97.5 F (36.4 C)  TempSrc: Oral  Height: 5' 8" (1.727 m)  Weight: 159 lb (72.122 kg)  SpO2: 95%   Body mass index is 24.18 kg/(m^2). Physical Exam  Constitutional: He is oriented to person, place, and time. He appears well-developed and well-nourished. No distress.  HENT:  Severe hearing loss. Bilateral aides. Right facial droop.   Eyes:  Corrective lenses. Increased exposure of the right cornea due to post CVA facial droop. Irregular pupil on the left. Ectropion of both lower lids.  Neck: No JVD present. No tracheal deviation present. No thyromegaly present.  Cardiovascular: Normal rate and normal heart sounds.  Frequent extrasystoles are present.  No murmur heard. Pacemaker left upper chest  Pulmonary/Chest: Effort normal. He has no wheezes. He has rales.  dry rales posterior lung bases.     Abdominal: Soft. Bowel sounds are normal. He exhibits no distension and no mass. There is no tenderness.  Genitourinary:  A tender pilonidal cyst like growth at the right side of anus  Musculoskeletal: Normal range of motion. He exhibits edema. He exhibits no tenderness.  Unstable gait. Using 4 wheel walker and w/c. Weaker on the right side. Hx of the right total knee replacement. Trace edema BLE, R>L. C/o left knee pain, f/u Ortho. Motorized w/c to go further  Lymphadenopathy:    He has no cervical adenopathy.  Neurological: He is alert and oriented to person, place, and time. He displays normal reflexes. No cranial nerve deficit. He exhibits normal muscle tone. Coordination normal.  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 Recurrent CVA on 04/30/13 of the left MCA area Left facial weakness  Skin: Skin is warm and dry. No rash noted. No erythema. No pallor.  Psychiatric: He has a normal mood and affect. His behavior is normal. Judgment and thought content normal.  Nursing note and vitals reviewed.   Labs reviewed: Basic Metabolic Panel:  Recent Labs  09/16/14  12/09/14 12/24/14 01/16/15 02/19/15  NA 136*  < > 135* 138 139  --   K 4.0  < >  4.0 4.0 3.8  --   BUN 22*  < > 24* 27* 25*  --   CREATININE 0.9  < > 1.0 0.9 1.0  --   TSH 2.96  --   --   --  7.20* 3.19  < > = values in this interval not displayed. Liver Function Tests:  Recent Labs  05/30/14 09/16/14 01/16/15  AST _0 ALT _1 ALKPHOS 93 87 104   No results for input(s): LIPASE, AMYLASE in the last 8760 hours. No results for input(s): AMMONIA in the last 8760 hours. CBC:  Recent Labs  09/16/14 11/25/14 01/16/15  WBC 5.7 7.2 7.1  HGB 14.3 15.0 16.0  HCT 42 44 48  PLT 223 200 180   Lipid Panel: No results for input(s): CHOL, HDL, LDLCALC, TRIG, CHOLHDL, LDLDIRECT in the last 8760 hours. Lab Results  Component Value Date   HGBA1C 6.5* 08/29/2013      Assessment/Plan 1. Essential hypertension Adequately controlled  2. Hypothyroidism, unspecified hypothyroidism type Compensated  3. Dyslipidemia Controlled  4. Type 2 diabetes mellitus with stage 3 chronic kidney disease, without long-term current use of insulin (HCC) Controlled  5. Depression due to stroke ([place]) Improved  6. Cerebral infarction due to thrombosis of carotid artery (HCC) Unchanged  7. Chronic systolic congestive heart failure (HCC) Compensated  8. Dyspnea Improved  9. Cough Persistent SAdd Duo nebs  3 times a day  10. Urinary incontinence, unspecified incontinence type Persistent problem without change  11. Edema, unspecified type And spironolactone 25 mg daily  12. Arthralgia of left lower leg Patient will continue to go to [facility]  13. Ectropion, unspecified laterality Unchanged

## 2015-05-22 DIAGNOSIS — Z471 Aftercare following joint replacement surgery: Secondary | ICD-10-CM | POA: Diagnosis not present

## 2015-05-22 DIAGNOSIS — Z96651 Presence of right artificial knee joint: Secondary | ICD-10-CM | POA: Diagnosis not present

## 2015-05-22 DIAGNOSIS — M1712 Unilateral primary osteoarthritis, left knee: Secondary | ICD-10-CM | POA: Diagnosis not present

## 2015-05-25 DIAGNOSIS — R32 Unspecified urinary incontinence: Secondary | ICD-10-CM | POA: Insufficient documentation

## 2015-05-27 DIAGNOSIS — M25562 Pain in left knee: Secondary | ICD-10-CM | POA: Diagnosis not present

## 2015-05-27 DIAGNOSIS — M1712 Unilateral primary osteoarthritis, left knee: Secondary | ICD-10-CM | POA: Diagnosis not present

## 2015-06-01 DIAGNOSIS — M6281 Muscle weakness (generalized): Secondary | ICD-10-CM | POA: Diagnosis not present

## 2015-06-01 DIAGNOSIS — R2689 Other abnormalities of gait and mobility: Secondary | ICD-10-CM | POA: Diagnosis not present

## 2015-06-01 DIAGNOSIS — I69954 Hemiplegia and hemiparesis following unspecified cerebrovascular disease affecting left non-dominant side: Secondary | ICD-10-CM | POA: Diagnosis not present

## 2015-06-01 DIAGNOSIS — M2550 Pain in unspecified joint: Secondary | ICD-10-CM | POA: Diagnosis not present

## 2015-06-01 DIAGNOSIS — I509 Heart failure, unspecified: Secondary | ICD-10-CM | POA: Diagnosis not present

## 2015-06-01 DIAGNOSIS — R2681 Unsteadiness on feet: Secondary | ICD-10-CM | POA: Diagnosis not present

## 2015-06-01 DIAGNOSIS — I4891 Unspecified atrial fibrillation: Secondary | ICD-10-CM | POA: Diagnosis not present

## 2015-06-01 DIAGNOSIS — M25562 Pain in left knee: Secondary | ICD-10-CM | POA: Diagnosis not present

## 2015-06-01 DIAGNOSIS — E039 Hypothyroidism, unspecified: Secondary | ICD-10-CM | POA: Diagnosis not present

## 2015-06-01 DIAGNOSIS — R29818 Other symptoms and signs involving the nervous system: Secondary | ICD-10-CM | POA: Diagnosis not present

## 2015-06-01 DIAGNOSIS — R29898 Other symptoms and signs involving the musculoskeletal system: Secondary | ICD-10-CM | POA: Diagnosis not present

## 2015-06-01 DIAGNOSIS — D472 Monoclonal gammopathy: Secondary | ICD-10-CM | POA: Diagnosis not present

## 2015-06-02 DIAGNOSIS — E119 Type 2 diabetes mellitus without complications: Secondary | ICD-10-CM | POA: Diagnosis not present

## 2015-06-02 LAB — HEPATIC FUNCTION PANEL
ALK PHOS: 103 U/L (ref 25–125)
ALT: 11 U/L (ref 10–40)
AST: 17 U/L (ref 14–40)
Bilirubin, Total: 1.2 mg/dL

## 2015-06-02 LAB — CBC AND DIFFERENTIAL
HCT: 46 % (ref 41–53)
HEMOGLOBIN: 15.8 g/dL (ref 13.5–17.5)
Platelets: 326 10*3/uL (ref 150–399)
WBC: 6.9 10*3/mL

## 2015-06-02 LAB — BASIC METABOLIC PANEL
BUN: 30 mg/dL — AB (ref 4–21)
CREATININE: 1.2 mg/dL (ref 0.6–1.3)
Glucose: 114 mg/dL
POTASSIUM: 4 mmol/L (ref 3.4–5.3)
Sodium: 137 mmol/L (ref 137–147)

## 2015-06-03 DIAGNOSIS — M1712 Unilateral primary osteoarthritis, left knee: Secondary | ICD-10-CM | POA: Diagnosis not present

## 2015-06-03 DIAGNOSIS — R29818 Other symptoms and signs involving the nervous system: Secondary | ICD-10-CM | POA: Diagnosis not present

## 2015-06-03 DIAGNOSIS — M6281 Muscle weakness (generalized): Secondary | ICD-10-CM | POA: Diagnosis not present

## 2015-06-03 DIAGNOSIS — I69954 Hemiplegia and hemiparesis following unspecified cerebrovascular disease affecting left non-dominant side: Secondary | ICD-10-CM | POA: Diagnosis not present

## 2015-06-03 DIAGNOSIS — R2689 Other abnormalities of gait and mobility: Secondary | ICD-10-CM | POA: Diagnosis not present

## 2015-06-03 DIAGNOSIS — M25562 Pain in left knee: Secondary | ICD-10-CM | POA: Diagnosis not present

## 2015-06-03 DIAGNOSIS — R2681 Unsteadiness on feet: Secondary | ICD-10-CM | POA: Diagnosis not present

## 2015-06-04 DIAGNOSIS — I509 Heart failure, unspecified: Secondary | ICD-10-CM | POA: Diagnosis not present

## 2015-06-04 LAB — BASIC METABOLIC PANEL
BUN: 33 mg/dL — AB (ref 4–21)
Creatinine: 1.3 mg/dL (ref 0.6–1.3)
Glucose: 107 mg/dL
Potassium: 3.9 mmol/L (ref 3.4–5.3)
SODIUM: 138 mmol/L (ref 137–147)

## 2015-06-04 LAB — HEPATIC FUNCTION PANEL
ALK PHOS: 94 U/L (ref 25–125)
ALT: 11 U/L (ref 10–40)
AST: 18 U/L (ref 14–40)
BILIRUBIN, TOTAL: 1 mg/dL

## 2015-06-09 DIAGNOSIS — M6281 Muscle weakness (generalized): Secondary | ICD-10-CM | POA: Diagnosis not present

## 2015-06-09 DIAGNOSIS — I4891 Unspecified atrial fibrillation: Secondary | ICD-10-CM | POA: Diagnosis not present

## 2015-06-09 DIAGNOSIS — M25562 Pain in left knee: Secondary | ICD-10-CM | POA: Diagnosis not present

## 2015-06-09 DIAGNOSIS — I509 Heart failure, unspecified: Secondary | ICD-10-CM | POA: Diagnosis not present

## 2015-06-09 DIAGNOSIS — E039 Hypothyroidism, unspecified: Secondary | ICD-10-CM | POA: Diagnosis not present

## 2015-06-09 DIAGNOSIS — R2681 Unsteadiness on feet: Secondary | ICD-10-CM | POA: Diagnosis not present

## 2015-06-09 DIAGNOSIS — R29818 Other symptoms and signs involving the nervous system: Secondary | ICD-10-CM | POA: Diagnosis not present

## 2015-06-09 DIAGNOSIS — I69954 Hemiplegia and hemiparesis following unspecified cerebrovascular disease affecting left non-dominant side: Secondary | ICD-10-CM | POA: Diagnosis not present

## 2015-06-09 DIAGNOSIS — M2550 Pain in unspecified joint: Secondary | ICD-10-CM | POA: Diagnosis not present

## 2015-06-09 DIAGNOSIS — D472 Monoclonal gammopathy: Secondary | ICD-10-CM | POA: Diagnosis not present

## 2015-06-09 DIAGNOSIS — R2689 Other abnormalities of gait and mobility: Secondary | ICD-10-CM | POA: Diagnosis not present

## 2015-06-10 DIAGNOSIS — M25562 Pain in left knee: Secondary | ICD-10-CM | POA: Diagnosis not present

## 2015-06-10 DIAGNOSIS — M1712 Unilateral primary osteoarthritis, left knee: Secondary | ICD-10-CM | POA: Diagnosis not present

## 2015-06-11 ENCOUNTER — Encounter: Payer: Self-pay | Admitting: Nurse Practitioner

## 2015-06-11 ENCOUNTER — Non-Acute Institutional Stay: Payer: Medicare Other | Admitting: Nurse Practitioner

## 2015-06-11 VITALS — BP 116/68 | HR 84 | Temp 97.4°F | Ht 68.0 in | Wt 150.2 lb

## 2015-06-11 DIAGNOSIS — M170 Bilateral primary osteoarthritis of knee: Secondary | ICD-10-CM

## 2015-06-11 DIAGNOSIS — M1000 Idiopathic gout, unspecified site: Secondary | ICD-10-CM

## 2015-06-11 DIAGNOSIS — I10 Essential (primary) hypertension: Secondary | ICD-10-CM

## 2015-06-11 DIAGNOSIS — M25562 Pain in left knee: Secondary | ICD-10-CM | POA: Diagnosis not present

## 2015-06-11 DIAGNOSIS — I480 Paroxysmal atrial fibrillation: Secondary | ICD-10-CM | POA: Diagnosis not present

## 2015-06-11 DIAGNOSIS — M6281 Muscle weakness (generalized): Secondary | ICD-10-CM | POA: Diagnosis not present

## 2015-06-11 DIAGNOSIS — I63039 Cerebral infarction due to thrombosis of unspecified carotid artery: Secondary | ICD-10-CM

## 2015-06-11 DIAGNOSIS — E039 Hypothyroidism, unspecified: Secondary | ICD-10-CM | POA: Diagnosis not present

## 2015-06-11 DIAGNOSIS — N3942 Incontinence without sensory awareness: Secondary | ICD-10-CM

## 2015-06-11 DIAGNOSIS — R634 Abnormal weight loss: Secondary | ICD-10-CM

## 2015-06-11 DIAGNOSIS — R2689 Other abnormalities of gait and mobility: Secondary | ICD-10-CM | POA: Diagnosis not present

## 2015-06-11 DIAGNOSIS — F329 Major depressive disorder, single episode, unspecified: Secondary | ICD-10-CM | POA: Diagnosis not present

## 2015-06-11 DIAGNOSIS — I69954 Hemiplegia and hemiparesis following unspecified cerebrovascular disease affecting left non-dominant side: Secondary | ICD-10-CM | POA: Diagnosis not present

## 2015-06-11 DIAGNOSIS — I5022 Chronic systolic (congestive) heart failure: Secondary | ICD-10-CM | POA: Diagnosis not present

## 2015-06-11 DIAGNOSIS — R2681 Unsteadiness on feet: Secondary | ICD-10-CM | POA: Diagnosis not present

## 2015-06-11 DIAGNOSIS — R29818 Other symptoms and signs involving the nervous system: Secondary | ICD-10-CM | POA: Diagnosis not present

## 2015-06-11 NOTE — Assessment & Plan Note (Signed)
compensated clinically, continue Furosemide 40mg  bid, prn Furosemide. Spironolactone 25mg . F/u BMP

## 2015-06-11 NOTE — Assessment & Plan Note (Signed)
No flare ups. continue Allopurinol 50mg daily.  

## 2015-06-11 NOTE — Assessment & Plan Note (Signed)
increased urinary frequency last night, may consider UA C/S if no better, continue Tamsulosin 0.4mg 

## 2015-06-11 NOTE — Assessment & Plan Note (Signed)
Controlled, continue Metoprolol 100 mg twice a day, Losartan 100mg , Furosemide 40mg  bid, Spironolactone 25mg 

## 2015-06-11 NOTE — Assessment & Plan Note (Signed)
Weight loss about #10Ibs in the past 2-3 months, decreased appetite, Spironolactone 25mg  added about a week ago. urineary frequently 5-6x/last night, he wants to wait and see if UA C/S needed. Also desires delaying Mirtazapine 7.5mg  for appetite today. F/u BMP

## 2015-06-11 NOTE — Progress Notes (Signed)
Patient ID: Jon [name], male   DOB: 04/04/1921, 82 y.o.   MRN: [id number]  Location:  [facility]   Place of Service: clinic [facility] Provider:  [name] NP  GREEN, ARTHUR G, MD  Patient Care Team: Arthur G Green, MD as PCP - General (Internal Medicine) [facility] Kenneth C Hilty, MD as Consulting Physician (Cardiology) Benjamin W Herrick, MD as Attending Physician (Urology)  Extended Emergency Contact Information Primary Emergency Contact: Praire,Nancy  [place] Mobile Phone: [phone] Relation: Daughter Secondary Emergency Contact: Motsinger,Margie  [place] Home Phone: [phone] Mobile Phone: [phone] Relation: Daughter  Code Status:  DNR Goals of care: Advanced Directive information Advanced Directives 06/11/2015  Does patient have an advance directive? Yes  Type of Advance Directive Healthcare Power of Attorney;Living will  Copy of advanced directive(s) in chart? Yes     Chief Complaint  Patient presents with  . Blood Pressure Check    Decreased blood pressure  . Weight Loss    patient is concerned about his weight loss   HPI:  Pt is a 82 y.o. male seen today for medical management of chronic diseases.  Weight loss about #10Ibs in the past 2-3 months, decreased appetite, Spironolactone 25mg added about a week ago. urineary frequently 5-6x/last night, he wants to wait and see if UA C/S needed. Also desires delaying Mirtazapine 7.5mg for appetite today.   Hx of hypothyroidism, takingLevothyroxine 125mcg, TSH wnl 3.19 02/19/15. CVA, CHF, HTN, anxiety, urinary frequency.   CHF: less SOB, dry rales back of lungs, on Furosemide 40mg bid, Spironolactone 25mg.   Hypothyroidism, taking Levothyroxine 125mcg, last TSH wnl  BPH: no urinary retention, but dribbling, taking Tamsulosin 0.4mg daily  Gout: no flare ups, taking Allopurinol 50mg  Afib/late effect of CVA: heart rate is in control, left facial weakness,  ambulates with walker, taking Eliquis   Past Medical History  Diagnosis Date  . Hypertension   . Coronary artery disease   . Arthritis   . Cataract   . Anxiety   . Blood transfusion without reported diagnosis   . Hyperlipidemia   . Heart murmur   . Allergy   . Thyroid disease   . PAF (paroxysmal atrial fibrillation) (HCC)     coumadin  . History of abdominal aortic aneurysm   . History of echocardiogram 12/2008    EF >55%; mild concentric LVH; mild MR; mild-mod TR; mild AV regurg;   . History of nuclear stress test 12/2010    lexiscan; low risk; compared to prior study, perfusion improved  . History of Doppler ultrasound 05/22/2012    LEAs; R anterior tibial artery appeared occluded; L posterior tibial shows short segment of occlusive ds  . History of Doppler ultrasound 05/22/2012    Abdominal Aortic Doppler; slight increase in fusiform aneurysm   . Stroke (HCC)   . Adjustment disorder with mixed anxiety and depressed mood 04/18/2013    Post CVA depression and anxiety   . Abnormality of gait 04/18/2013  . 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 Increased exposure of the right cornea.   . Pruritic condition 01/24/2013  . Memory change 01/24/2013  . Abnormal PFTs 10/30/2012    Followed in Pulmonary clinic/ [facility]/ [place]  - PFT's 10/22/12 VC  55% no obst, DLCO 50%  - PFT's 12/20/2012 VC 83% and no obst, dLCO 55%  -11/08/2012  Walked RA x 3 laps @ 185 ft each stopped due to  End   of study, not desat -11/08/12 esr 10    . Impotence of organic origin 10/03/2000  . Osteoarthrosis, unspecified whether generalized or localized, unspecified site 02/05/2003  . Hypertrophy of prostate with urinary obstruction and other lower urinary tract symptoms (LUTS) 04/28/2004  . Gout, unspecified 11/03/2004  . Acute on chronic systolic congestive heart failure, NYHA class 2 (HCC) 03/16/2006    BNP 376.9 05/28/13   . Atherosclerosis of renal artery (HCC) 10/18/2006  . Unspecified  vitamin D deficiency 10/18/2006  . Obstructive sleep apnea (adult) (pediatric) 07/23/2008  . Major depressive disorder, single episode, unspecified (HCC) 03/05/2009  . Basal cell carcinoma of skin of other and unspecified parts of face 12/24/2009  . Internal hemorrhoids without mention of complication 02/03/2011  . Muscle weakness (generalized) 03/10/2011  . Pain in joint, lower leg 08/04/2011  . Nocturia 10/27/2011  . Aortic aneurysm of unspecified site without mention of rupture 10/27/2011  . Fall 03/04/2011  . Hyponatremia 03/04/2011  . TIA (transient ischemic attack) 04/30/2013  . Fatigue 04/18/2013  . 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 CVA - Rt brain stroke 12/14 02/18/2013    02/19/13 angiography CT head:  Diffuse atherosclerotic irregularity and plaque formation of the distal right common carotid artery and proximal internal carotid artery without significant stenosis. Plaque ulceration is present and is a source of emboli. A small right thalamic CVA    . Dyslipidemia 11/03/2004  . Type II or unspecified type diabetes mellitus without mention of complication, not stated as uncontrolled 11/24/2004  . Spinal stenosis in cervical region 12/15/2004  . Right bundle branch block 04/19/2006  . Cardiac pacemaker in situ- MDT 10/11 03/18/2010    Medtronic revo implant in October 2011. Severe sinus bradycardia in the 30s, but not truly pacemaker dependent   . Long term (current) use of anticoagulants 03/17/2011  . Hypothyroidism 03/04/2011  . HTN (hypertension) 03/04/2011  . Atrial fibrillation (HCC) 03/04/2011     He was on coumadin until his stroke last December and was switched to apixaban 2.5mg daily which he has been taking faithfully without reported side effects.    . SSS (sick sinus syndrome) (HCC) 04/08/2014  . DM (diabetes mellitus), type 2 with renal complications (HCC) 07/03/2014  . CKD stage 2 due to type 2 diabetes mellitus (HCC)  07/03/2014  . Osteoarthritis of both knees 07/31/2014  . Quadriceps weakness 07/31/2014  . Balance disorder 07/31/2014  . Open wound of knee, leg (except thigh), and ankle, complicated 07/31/2014   Past Surgical History  Procedure Laterality Date  . Coronary artery bypass graft  1989  . Pacemaker insertion  2011  . Insert / replace / remove pacemaker    . Eye surgery    . Joint replacement    . Hernia repair    . Coronary angioplasty with stent placement  2008    stent to SVG to OM    Allergies  Allergen Reactions  . Quinolones Other (See Comments)    Risk of rupture of AAA  . Caduet [Amlodipine-Atorvastatin] Other (See Comments)    Weakness   . Crestor [Rosuvastatin] Other (See Comments)    Muscle pain/weakness  . Lipitor [Atorvastatin Calcium] Other (See Comments)    Muscle pain/weakness  . Simvastatin Other (See Comments)    Muscle pain/weakness  . Lisinopril Other (See Comments)    Doesn't remember reaction      Medication List       This list   is accurate as of: 06/11/15  4:56 PM.  Always use your most recent med list.               acetaminophen 325 MG tablet  Commonly known as:  TYLENOL  Take 650 mg by mouth every morning.     allopurinol 100 MG tablet  Commonly known as:  ZYLOPRIM  Take 50 mg by mouth daily.     amoxicillin 500 MG capsule  Commonly known as:  AMOXIL  Take 500 mg by mouth. Take 4 caps prior to dental procedures     apixaban 2.5 MG Tabs tablet  Commonly known as:  ELIQUIS  Take 1 tablet (2.5 mg total) by mouth 2 (two) times daily.     BOOST COMPACT Liqd  Take 1 Container by mouth daily. 237 mls daily     carboxymethylcellulose 0.5 % Soln  Commonly known as:  REFRESH PLUS  Place 1 drop into both eyes every evening.     furosemide 40 MG tablet  Commonly known as:  LASIX  Take 40 mg by mouth 2 (two) times daily.     furosemide 20 MG tablet  Commonly known as:  LASIX  Take 20 mg by mouth daily as needed for fluid or edema (takes  20mg if gain >3 lbs in a day, >5 lbs in a week). Reported on 04/16/2015     ipratropium 0.06 % nasal spray  Commonly known as:  ATROVENT  One puff in each nostril before each meal     ipratropium-albuterol 0.5-2.5 (3) MG/3ML Soln  Commonly known as:  DUONEB  Take 3 mLs by nebulization 4 (four) times daily as needed.     levothyroxine 125 MCG tablet  Commonly known as:  SYNTHROID, LEVOTHROID  Take 125 mcg by mouth daily before breakfast.     LORazepam 0.5 MG tablet  Commonly known as:  ATIVAN  Take 0.25 mg by mouth at bedtime. Take 1 tab by mouth every four hours as needed     losartan 100 MG tablet  Commonly known as:  COZAAR  Take 1 tablet (100 mg total) by mouth daily.     metoprolol 100 MG tablet  Commonly known as:  LOPRESSOR  Take 100 mg by mouth 2 (two) times daily.     nitroGLYCERIN 0.4 MG SL tablet  Commonly known as:  NITROSTAT  Place 0.4 mg under the tongue every 5 (five) minutes as needed for chest pain.     olopatadine 0.1 % ophthalmic solution  Commonly known as:  PATANOL  Place 1 drop into both eyes 2 (two) times daily.     polyethylene glycol packet  Commonly known as:  MIRALAX / GLYCOLAX  Take 17 g by mouth daily.     polyvinyl alcohol 1.4 % ophthalmic solution  Commonly known as:  LIQUIFILM TEARS  Place 1 drop into both eyes. One drop in both eyes twice daily as needed for dry eyes     protein supplement Powd  Take 1 scoop by mouth daily as needed.     spironolactone 25 MG tablet  Commonly known as:  ALDACTONE  Take 25 mg by mouth daily.     tamsulosin 0.4 MG Caps capsule  Commonly known as:  FLOMAX  Take one cap by mouth daily     tobramycin-dexamethasone ophthalmic ointment  Commonly known as:  TOBRADEX  Place 1 application into both eyes 2 (two) times daily.     Vitamin D 400 units capsule  Take 400 Units   by mouth 2 (two) times daily.        Review of Systems  Constitutional: Positive for unexpected weight change. Negative for fever.         Generalized weakness  HENT: Positive for congestion and hearing loss. Negative for ear discharge, ear pain and tinnitus.   Eyes: Negative.   Respiratory: Positive for cough and shortness of breath. Negative for wheezing.   Cardiovascular: Positive for leg swelling (sometimes, not apparent today. ). Negative for palpitations.       Trace edema BLE, R>L  Gastrointestinal: Negative for abdominal pain and diarrhea.       Chokes on swallowing since his CVA. Episodes of fecal incontinence. Hx of hemorrhoids.A tender Pilonidal cyst like growth at the right side of anus   Genitourinary: Positive for urgency and frequency.       Incontinent from time to time. Followed by Dr. Matilde Sprang.  Musculoskeletal: Positive for back pain. Negative for neck pain.  Skin: Negative for rash.  Neurological: Positive for weakness.       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 Recurrent CVA on 04/30/13 of the left MCA area Left facial weakness  Psychiatric/Behavioral: The patient is not nervous/anxious.     Immunization History  Administered Date(s) Administered  . Influenza Whole 12/06/2011, 12/19/2012  . Influenza-Unspecified 01/09/2014, 11/25/2014  . PPD Test 05/03/2013  . Pneumococcal Polysaccharide-23 03/07/1986  . Td 04/20/2005   Pertinent  Health Maintenance Due  Topic Date Due  . OPHTHALMOLOGY EXAM  01/12/1934  . PNA vac Low Risk Adult (1 of 2 - PCV13) 01/12/1989  . HEMOGLOBIN A1C  02/28/2014  . FOOT EXAM  07/31/2015  . INFLUENZA VACCINE  10/06/2015   Fall Risk  06/11/2015 10/23/2014 07/07/2014 02/17/2014 09/13/2012  Falls in the past year? Yes Yes Yes Exclusion - non ambulatory No  Number falls in past yr: _0 - -  Injury with Fall? Yes - No - -  Risk for fall due to : - - History of fall(s);Impaired mobility;Impaired balance/gait - -  Follow up - - Falls evaluation completed;Falls prevention discussed - -   Functional Status Survey:     Filed Vitals:   06/11/15 1504  BP: 116/68  Pulse: 84  Temp: 97.4 F (36.3 C)  TempSrc: Oral  Height: 5' 8" (1.727 m)  Weight: 150 lb 3.2 oz (68.13 kg)   Body mass index is 22.84 kg/(m^2). Physical Exam  Constitutional: He is oriented to person, place, and time. He appears well-developed and well-nourished. No distress.  HENT:  Severe hearing loss. Bilateral aides. Right facial droop.   Eyes:  Corrective lenses. Increased exposure of the right cornea due to post CVA facial droop. Irregular pupil on the left  Neck: No JVD present. No tracheal deviation present. No thyromegaly present.  Cardiovascular: Normal rate and normal heart sounds.  Frequent extrasystoles are present.  No murmur heard. Pacemaker left upper chest  Pulmonary/Chest: Effort normal. He has no wheezes. He has rales.  dry rales posterior lung bases.   Abdominal: Soft. Bowel sounds are normal. He exhibits no distension and no mass. There is no tenderness.  Genitourinary:  .A tender Pilonidal cyst like growth at the right side of anus  Musculoskeletal: Normal range of motion. He exhibits edema. He exhibits no tenderness.  Unstable gait. Using 4 wheel walker and w/c. Weaker on the right side. Hx of the right total knee replacement. Trace edema BLE, R>L. C/o left knee pain, f/u Ortho. Motorized  w/c to go further  Lymphadenopathy:    He has no cervical adenopathy.  Neurological: He is alert and oriented to person, place, and time. He displays normal reflexes. No cranial nerve deficit. He exhibits normal muscle tone. Coordination normal.  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 Recurrent CVA on 04/30/13 of the left MCA area Left facial weakness  Skin: Skin is warm and dry. No rash noted. No erythema. No pallor.  Psychiatric: He has a normal mood and affect. His behavior is normal. Judgment and thought content normal.  Nursing note and vitals reviewed.   Labs  reviewed:  Recent Labs  01/16/15 06/02/15 06/04/15  NA 139 137 138  K 3.8 4.0 3.9  BUN 25* 30* 33*  CREATININE 1.0 1.2 1.3    Recent Labs  01/16/15 06/02/15 06/04/15  AST 22 17 18  ALT 11 11 11  ALKPHOS 104 103 94    Recent Labs  11/25/14 01/16/15 06/02/15  WBC 7.2 7.1 6.9  HGB 15.0 16.0 15.8  HCT 44 48 46  PLT 200 180 326   Lab Results  Component Value Date   TSH 3.19 02/19/2015   Lab Results  Component Value Date   HGBA1C 6.5* 08/29/2013   Lab Results  Component Value Date   CHOL 117 05/01/2013   HDL 30* 05/01/2013   LDLCALC 71 05/01/2013   TRIG 79 05/01/2013   CHOLHDL 3.9 05/01/2013    Significant Diagnostic Results in last 30 days:  No results found.  Assessment/Plan  Loss of weight Weight loss about #10Ibs in the past 2-3 months, decreased appetite, Spironolactone 25mg added about a week ago. urineary frequently 5-6x/last night, he wants to wait and see if UA C/S needed. Also desires delaying Mirtazapine 7.5mg for appetite today. F/u BMP  Atrial fibrillation Rate controlled, continue Metoprolol 100mg bid. Eliquis for VTE risk reduction.   Hypothyroidism 09/16/14 TSH 2.965 01/15/15 7.202 02/19/15 TSH 3.194 Continue Levothyroxine 125mcg, update TSH in setting of weight loss  Incontinent of urine increased urinary frequency last night, may consider UA C/S if no better, continue Tamsulosin 0.4mg  Gout No flare ups. continue Allopurinol 50mg daily.  Osteoarthritis of both knees S/p left knee inj, continue Tylenol 650mg bid.   HTN (hypertension) Controlled, continue Metoprolol 100 mg twice a day, Losartan 100mg, Furosemide 40mg bid, Spironolactone 25mg   Major depressive disorder, single episode, unspecified Continue Lorazepam 0.25mg qhs  Chronic systolic congestive heart failure (HCC) compensated clinically, continue Furosemide 40mg bid, prn Furosemide. Spironolactone 25mg. F/u BMP      Family/ staff Communication: continue AL for care  needs, mobility, walker for short distance and motorized w/c to go further.   Labs/tests ordered:  BMP, TSH   

## 2015-06-11 NOTE — Assessment & Plan Note (Signed)
Rate controlled, continue Metoprolol 100mg bid. Eliquis for VTE risk reduction.  

## 2015-06-11 NOTE — Assessment & Plan Note (Signed)
S/p left knee inj, continue Tylenol 650mg  bid.

## 2015-06-11 NOTE — Assessment & Plan Note (Signed)
09/16/14 TSH 2.965 01/15/15 7.202 02/19/15 TSH 3.194 Continue Levothyroxine 169mcg, update TSH in setting of weight loss

## 2015-06-11 NOTE — Assessment & Plan Note (Signed)
Continue Lorazepam 0.25mg  qhs

## 2015-06-16 ENCOUNTER — Encounter: Payer: Self-pay | Admitting: Nurse Practitioner

## 2015-06-16 DIAGNOSIS — I69954 Hemiplegia and hemiparesis following unspecified cerebrovascular disease affecting left non-dominant side: Secondary | ICD-10-CM | POA: Diagnosis not present

## 2015-06-16 DIAGNOSIS — M25562 Pain in left knee: Secondary | ICD-10-CM | POA: Diagnosis not present

## 2015-06-16 DIAGNOSIS — E890 Postprocedural hypothyroidism: Secondary | ICD-10-CM | POA: Diagnosis not present

## 2015-06-16 DIAGNOSIS — R29818 Other symptoms and signs involving the nervous system: Secondary | ICD-10-CM | POA: Diagnosis not present

## 2015-06-16 DIAGNOSIS — R2681 Unsteadiness on feet: Secondary | ICD-10-CM | POA: Diagnosis not present

## 2015-06-16 DIAGNOSIS — M6281 Muscle weakness (generalized): Secondary | ICD-10-CM | POA: Diagnosis not present

## 2015-06-16 DIAGNOSIS — R2689 Other abnormalities of gait and mobility: Secondary | ICD-10-CM | POA: Diagnosis not present

## 2015-06-16 DIAGNOSIS — I10 Essential (primary) hypertension: Secondary | ICD-10-CM | POA: Diagnosis not present

## 2015-06-16 LAB — BASIC METABOLIC PANEL
BUN: 55 mg/dL — AB (ref 4–21)
CREATININE: 1.4 mg/dL — AB (ref 0.6–1.3)
Glucose: 107 mg/dL
POTASSIUM: 4.8 mmol/L (ref 3.4–5.3)
SODIUM: 136 mmol/L — AB (ref 137–147)

## 2015-06-18 ENCOUNTER — Encounter: Payer: Self-pay | Admitting: Internal Medicine

## 2015-06-18 ENCOUNTER — Non-Acute Institutional Stay: Payer: Medicare Other | Admitting: Internal Medicine

## 2015-06-18 VITALS — BP 110/70 | HR 93 | Temp 97.4°F | Resp 20 | Ht 68.0 in | Wt 150.2 lb

## 2015-06-18 DIAGNOSIS — R2681 Unsteadiness on feet: Secondary | ICD-10-CM | POA: Diagnosis not present

## 2015-06-18 DIAGNOSIS — M25562 Pain in left knee: Secondary | ICD-10-CM | POA: Diagnosis not present

## 2015-06-18 DIAGNOSIS — N3942 Incontinence without sensory awareness: Secondary | ICD-10-CM

## 2015-06-18 DIAGNOSIS — I69954 Hemiplegia and hemiparesis following unspecified cerebrovascular disease affecting left non-dominant side: Secondary | ICD-10-CM | POA: Diagnosis not present

## 2015-06-18 DIAGNOSIS — I5022 Chronic systolic (congestive) heart failure: Secondary | ICD-10-CM | POA: Diagnosis not present

## 2015-06-18 DIAGNOSIS — R609 Edema, unspecified: Secondary | ICD-10-CM | POA: Diagnosis not present

## 2015-06-18 DIAGNOSIS — M6281 Muscle weakness (generalized): Secondary | ICD-10-CM | POA: Diagnosis not present

## 2015-06-18 DIAGNOSIS — H02109 Unspecified ectropion of unspecified eye, unspecified eyelid: Secondary | ICD-10-CM

## 2015-06-18 DIAGNOSIS — K649 Unspecified hemorrhoids: Secondary | ICD-10-CM | POA: Diagnosis not present

## 2015-06-18 DIAGNOSIS — I63039 Cerebral infarction due to thrombosis of unspecified carotid artery: Secondary | ICD-10-CM | POA: Diagnosis not present

## 2015-06-18 DIAGNOSIS — R2689 Other abnormalities of gait and mobility: Secondary | ICD-10-CM | POA: Diagnosis not present

## 2015-06-18 DIAGNOSIS — R29818 Other symptoms and signs involving the nervous system: Secondary | ICD-10-CM | POA: Diagnosis not present

## 2015-06-18 DIAGNOSIS — I10 Essential (primary) hypertension: Secondary | ICD-10-CM | POA: Diagnosis not present

## 2015-06-18 DIAGNOSIS — J3489 Other specified disorders of nose and nasal sinuses: Secondary | ICD-10-CM

## 2015-06-18 MED ORDER — HYDROCORTISONE 1 % RE CREA: TOPICAL_CREAM | RECTAL | Status: DC

## 2015-06-18 NOTE — Progress Notes (Signed)
Patient ID: Jon [name], male   DOB: 03-17-1921, 82 y.o.   MRN: [id number]    [place] Room Number: 563  Place of Service: Clinic (12)     Allergies  Allergen Reactions  . Quinolones Other (See Comments)    Risk of rupture of AAA  . Caduet [Amlodipine-Atorvastatin] Other (See Comments)    Weakness   . Crestor [Rosuvastatin] Other (See Comments)    Muscle pain/weakness  . Lipitor [Atorvastatin Calcium] Other (See Comments)    Muscle pain/weakness  . Simvastatin Other (See Comments)    Muscle pain/weakness  . Lisinopril Other (See Comments)    Doesn't remember reaction    Chief Complaint  Patient presents with  . Medical Management of Chronic Issues    follow up from 05/21/15 when spironolactone was added.    HPI:  Patient last seen by me 05/21/2015. Because of increased edema, we added spironolactone 25 mg daily.  Patient was seen 06/11/15 by Marlana Latus, NP. Patient appear to be stable at that time.  Patient states that his rhinitis is better. It seems start getting better and he began using the DuoNeb nebulized medications. He has not been using the nasal Atrovent.  Diminished significantly improved since the addition of spironolactone.  She has lost about 10 pounds since beginning of the year. [name], NP suggested adding Remeron, but patient wanted to wait on this. I think some of his weight loss may be related to the improvement in the edema.  Patient would like to reorder Proctocort for his use when hemorrhoids are bothersome.  He believes that blood pressure measurements are more reliable when using the brachial cuff is compared to the wrist cuff that sometimes is brought in to his room.  Medications: Patient's Medications  New Prescriptions   No medications on file  Previous Medications   ACETAMINOPHEN (TYLENOL) 325 MG TABLET    Take 650 mg by mouth every morning.    ALLOPURINOL (ZYLOPRIM) 100 MG TABLET    Take 50 mg  by mouth daily.    AMOXICILLIN (AMOXIL) 500 MG CAPSULE    Take 500 mg by mouth. Take 4 caps prior to dental procedures   APIXABAN (ELIQUIS) 2.5 MG TABS TABLET    Take 1 tablet (2.5 mg total) by mouth 2 (two) times daily.   CARBOXYMETHYLCELLULOSE (REFRESH PLUS) 0.5 % SOLN    Place 1 drop into both eyes every evening.   CHOLECALCIFEROL (VITAMIN D) 400 UNITS CAPSULE    Take 400 Units by mouth 2 (two) times daily.    FUROSEMIDE (LASIX) 20 MG TABLET    Take 20 mg by mouth daily as needed for fluid or edema (takes 50m if gain >3 lbs in a day, >5 lbs in a week). Reported on 04/16/2015   FUROSEMIDE (LASIX) 40 MG TABLET    Take 40 mg by mouth 2 (two) times daily.    IPRATROPIUM (ATROVENT) 0.06 % NASAL SPRAY    One puff in each nostril before each meal   IPRATROPIUM-ALBUTEROL (DUONEB) 0.5-2.5 (3) MG/3ML SOLN    Take 3 mLs by nebulization 4 (four) times daily as needed.   LEVOTHYROXINE (SYNTHROID, LEVOTHROID) 125 MCG TABLET    Take 125 mcg by mouth daily before breakfast.   LORAZEPAM (ATIVAN) 0.5 MG TABLET    Take 0.25 mg by mouth at bedtime. Take 1 tab by mouth every four hours as needed   LOSARTAN (COZAAR) 100 MG TABLET    Take 1 tablet (100  mg total) by mouth daily.   METOPROLOL (LOPRESSOR) 100 MG TABLET    Take 100 mg by mouth 2 (two) times daily.   NITROGLYCERIN (NITROSTAT) 0.4 MG SL TABLET    Place 0.4 mg under the tongue every 5 (five) minutes as needed for chest pain.    NUTRITIONAL SUPPLEMENTS (BOOST COMPACT) LIQD    Take 1 Container by mouth daily. 237 mls daily   OLOPATADINE (PATANOL) 0.1 % OPHTHALMIC SOLUTION    Place 1 drop into both eyes 2 (two) times daily.   POLYETHYLENE GLYCOL (MIRALAX / GLYCOLAX) PACKET    Take 17 g by mouth daily.   POLYVINYL ALCOHOL (LIQUIFILM TEARS) 1.4 % OPHTHALMIC SOLUTION    Place 1 drop into both eyes. One drop in both eyes twice daily as needed for dry eyes   PROTEIN SUPPLEMENT (RESOURCE BENEPROTEIN) POWD    Take 1 scoop by mouth daily as needed.   SPIRONOLACTONE  (ALDACTONE) 25 MG TABLET    Take 25 mg by mouth daily.   TAMSULOSIN (FLOMAX) 0.4 MG CAPS CAPSULE    Take one cap by mouth daily   TOBRAMYCIN-DEXAMETHASONE (TOBRADEX) OPHTHALMIC OINTMENT    Place 1 application into both eyes 2 (two) times daily.  Modified Medications   No medications on file  Discontinued Medications   No medications on file     Review of Systems  Constitutional: Positive for unexpected weight change. Negative for fever.       Generalized weakness  HENT: Positive for congestion and hearing loss. Negative for ear discharge, ear pain and tinnitus.   Eyes: Negative.   Respiratory: Positive for cough and shortness of breath. Negative for wheezing.   Cardiovascular: Negative for palpitations and leg swelling (sometimes, not apparent today. ).  Gastrointestinal: Negative for abdominal pain and diarrhea.       Chokes on swallowing since his CVA. Episodes of fecal incontinence. Hx of hemorrhoids.A tender Pilonidal cyst like growth at the right side of anus   Genitourinary: Positive for urgency and frequency.       Incontinent from time to time. Followed by Dr. Matilde Sprang.  Musculoskeletal: Positive for back pain. Negative for neck pain.  Skin: Negative for rash.  Neurological: Positive for weakness.       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 Recurrent CVA on 04/30/13 of the left MCA area Left facial weakness  Psychiatric/Behavioral: The patient is not nervous/anxious.     Filed Vitals:   06/18/15 1313  BP: 110/70  Pulse: 93  Temp: 97.4 F (36.3 C)  Resp: 20  Height: _0  (1.727 m)  Weight: 150 lb 3.2 oz (68.13 kg)   Wt Readings from Last 3 Encounters:  06/18/15 150 lb 3.2 oz (68.13 kg)  06/11/15 150 lb 3.2 oz (68.13 kg)  05/21/15 159 lb (72.122 kg)    Body mass index is 22.84 kg/(m^2).  Physical Exam  Constitutional: He is oriented to person, place, and time. He appears well-developed and well-nourished. No  distress.  HENT:  Severe hearing loss. Bilateral aides. Right facial droop.   Eyes:  Corrective lenses. Increased exposure of the right cornea due to post CVA facial droop. Irregular pupil on the left  Neck: No JVD present. No tracheal deviation present. No thyromegaly present.  Cardiovascular: Normal rate and normal heart sounds.  Frequent extrasystoles are present.  No murmur heard. Pacemaker left upper chest  Pulmonary/Chest: Effort normal. He has no wheezes. He has rales.  dry rales posterior lung bases.  Abdominal: Soft. Bowel sounds are normal. He exhibits no distension and no mass. There is no tenderness.  Genitourinary:  .A tender Pilonidal cyst like growth at the right side of anus  Musculoskeletal: Normal range of motion. He exhibits no edema or tenderness.  Unstable gait. Using 4 wheel walker and w/c. Weaker on the right side. Hx of the right total knee replacement. Trace edema BLE, R>L. C/o left knee pain, f/u Ortho. Motorized w/c to go further  Lymphadenopathy:    He has no cervical adenopathy.  Neurological: He is alert and oriented to person, place, and time. He displays normal reflexes. No cranial nerve deficit. He exhibits normal muscle tone. Coordination normal.  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 Recurrent CVA on 04/30/13 of the left MCA area Left facial weakness  Skin: Skin is warm and dry. No rash noted. No erythema. No pallor.  Psychiatric: He has a normal mood and affect. His behavior is normal. Judgment and thought content normal.  Nursing note and vitals reviewed.    Labs reviewed: Lab Summary Latest Ref Rng 06/04/2015 06/02/2015 01/16/2015 12/24/2014  Hemoglobin 13.5 - 17.5 g/dL (None) 15.8 16.0 (None)  Hematocrit 41 - 53 % (None) 46 48 (None)  White count - (None) 6.9 7.1 (None)  Platelet count 150 - 399 K/L (None) 326 180 (None)  Sodium 137 - 147 mmol/L 138 137 139 138  Potassium 3.4 - 5.3 mmol/L 3.9 4.0  3.8 4.0  Calcium - (None) (None) (None) (None)  Phosphorus - (None) (None) (None) (None)  Creatinine 0.6 - 1.3 mg/dL 1.3 1.2 1.0 0.9  AST 14 - 40 U/L _0 (None)  Alk Phos 25 - 125 U/L 94 103 104 (None)  Bilirubin - (None) (None) (None) (None)  Glucose - 107 114 88 111  Cholesterol - (None) (None) (None) (None)  HDL cholesterol - (None) (None) (None) (None)  Triglycerides - (None) (None) (None) (None)  LDL Direct - (None) (None) (None) (None)  LDL Calc - (None) (None) (None) (None)  Total protein - (None) (None) (None) (None)  Albumin - (None) (None) (None) (None)   Lab Results  Component Value Date   TSH 3.19 02/19/2015   Lab Results  Component Value Date   BUN 33* 06/04/2015   BUN 30* 06/02/2015   BUN 25* 01/16/2015   Lab Results  Component Value Date   CREATININE 1.3 06/04/2015   CREATININE 1.2 06/02/2015   CREATININE 1.0 01/16/2015   Lab Results  Component Value Date   HGBA1C 6.5* 08/29/2013   HGBA1C 6.3* 05/01/2013   HGBA1C 6.0* 02/19/2013   Assessment/Plan  1. Edema, unspecified type Improved on spironolactone  2. Chronic systolic congestive heart failure ([place]) Compensated  3. Essential hypertension Measure with the medium or regular sized brachial cuff  4. Rhinorrhea Improved. Possibly related to onset of use of medications with nebulizer containing ipratropium.  5. Ectropion, unspecified laterality Patient stopped using eyedrops because he said they were not helpful. Right lower lid ectropion persists and is unlikely to improve.  6. Urinary incontinence without sensory awareness Patient says his problem is better. He is not sure whether it's actually improved with use just learned to deal with it.  7. Hemorrhoids Same Proctocort cream as needed

## 2015-06-23 DIAGNOSIS — R2689 Other abnormalities of gait and mobility: Secondary | ICD-10-CM | POA: Diagnosis not present

## 2015-06-23 DIAGNOSIS — R29818 Other symptoms and signs involving the nervous system: Secondary | ICD-10-CM | POA: Diagnosis not present

## 2015-06-23 DIAGNOSIS — M6281 Muscle weakness (generalized): Secondary | ICD-10-CM | POA: Diagnosis not present

## 2015-06-23 DIAGNOSIS — R2681 Unsteadiness on feet: Secondary | ICD-10-CM | POA: Diagnosis not present

## 2015-06-23 DIAGNOSIS — I69954 Hemiplegia and hemiparesis following unspecified cerebrovascular disease affecting left non-dominant side: Secondary | ICD-10-CM | POA: Diagnosis not present

## 2015-06-23 DIAGNOSIS — M25562 Pain in left knee: Secondary | ICD-10-CM | POA: Diagnosis not present

## 2015-06-25 DIAGNOSIS — I69954 Hemiplegia and hemiparesis following unspecified cerebrovascular disease affecting left non-dominant side: Secondary | ICD-10-CM | POA: Diagnosis not present

## 2015-06-25 DIAGNOSIS — M25562 Pain in left knee: Secondary | ICD-10-CM | POA: Diagnosis not present

## 2015-06-25 DIAGNOSIS — M6281 Muscle weakness (generalized): Secondary | ICD-10-CM | POA: Diagnosis not present

## 2015-06-25 DIAGNOSIS — R2681 Unsteadiness on feet: Secondary | ICD-10-CM | POA: Diagnosis not present

## 2015-06-25 DIAGNOSIS — R2689 Other abnormalities of gait and mobility: Secondary | ICD-10-CM | POA: Diagnosis not present

## 2015-06-25 DIAGNOSIS — R29818 Other symptoms and signs involving the nervous system: Secondary | ICD-10-CM | POA: Diagnosis not present

## 2015-06-30 DIAGNOSIS — R29818 Other symptoms and signs involving the nervous system: Secondary | ICD-10-CM | POA: Diagnosis not present

## 2015-06-30 DIAGNOSIS — M6281 Muscle weakness (generalized): Secondary | ICD-10-CM | POA: Diagnosis not present

## 2015-06-30 DIAGNOSIS — I69954 Hemiplegia and hemiparesis following unspecified cerebrovascular disease affecting left non-dominant side: Secondary | ICD-10-CM | POA: Diagnosis not present

## 2015-06-30 DIAGNOSIS — R2681 Unsteadiness on feet: Secondary | ICD-10-CM | POA: Diagnosis not present

## 2015-06-30 DIAGNOSIS — R2689 Other abnormalities of gait and mobility: Secondary | ICD-10-CM | POA: Diagnosis not present

## 2015-06-30 DIAGNOSIS — M25562 Pain in left knee: Secondary | ICD-10-CM | POA: Diagnosis not present

## 2015-07-02 DIAGNOSIS — R2681 Unsteadiness on feet: Secondary | ICD-10-CM | POA: Diagnosis not present

## 2015-07-02 DIAGNOSIS — I69954 Hemiplegia and hemiparesis following unspecified cerebrovascular disease affecting left non-dominant side: Secondary | ICD-10-CM | POA: Diagnosis not present

## 2015-07-02 DIAGNOSIS — M25562 Pain in left knee: Secondary | ICD-10-CM | POA: Diagnosis not present

## 2015-07-02 DIAGNOSIS — I10 Essential (primary) hypertension: Secondary | ICD-10-CM | POA: Diagnosis not present

## 2015-07-02 DIAGNOSIS — M6281 Muscle weakness (generalized): Secondary | ICD-10-CM | POA: Diagnosis not present

## 2015-07-02 DIAGNOSIS — E139 Other specified diabetes mellitus without complications: Secondary | ICD-10-CM | POA: Diagnosis not present

## 2015-07-02 DIAGNOSIS — R2689 Other abnormalities of gait and mobility: Secondary | ICD-10-CM | POA: Diagnosis not present

## 2015-07-02 DIAGNOSIS — R29818 Other symptoms and signs involving the nervous system: Secondary | ICD-10-CM | POA: Diagnosis not present

## 2015-07-02 LAB — HEPATIC FUNCTION PANEL
ALT: 25 U/L (ref 10–40)
AST: 27 U/L (ref 14–40)
Alkaline Phosphatase: 100 U/L (ref 25–125)

## 2015-07-02 LAB — BASIC METABOLIC PANEL
BUN: 50 mg/dL — AB (ref 4–21)
CREATININE: 1.5 mg/dL — AB (ref 0.6–1.3)
GLUCOSE: 105 mg/dL
POTASSIUM: 4.7 mmol/L (ref 3.4–5.3)
Sodium: 132 mmol/L — AB (ref 137–147)

## 2015-07-02 LAB — HEMOGLOBIN A1C: Hemoglobin A1C: 6.9

## 2015-07-05 ENCOUNTER — Other Ambulatory Visit: Payer: Self-pay | Admitting: Internal Medicine

## 2015-07-05 DIAGNOSIS — R609 Edema, unspecified: Secondary | ICD-10-CM

## 2015-07-05 DIAGNOSIS — E1122 Type 2 diabetes mellitus with diabetic chronic kidney disease: Secondary | ICD-10-CM

## 2015-07-05 DIAGNOSIS — N182 Chronic kidney disease, stage 2 (mild): Principal | ICD-10-CM

## 2015-07-05 MED ORDER — FUROSEMIDE 20 MG PO TABS: ORAL_TABLET | ORAL | Status: DC

## 2015-07-05 NOTE — Progress Notes (Signed)
Lab work was returned been reviewed. There has been an increase in BUN and creatinine since Lasix was stopped up to 40 mg twice daily and spironolactone was added.  Due to deterioration of renal function, I elected to reduce furosemide to 20 mg twice daily. He will continue on spironolactone.  Lab Results  Component Value Date   NA 132* 07/02/2015   K 4.7 07/02/2015   CL 104 04/30/2013   CO2 24 04/30/2013   Lab Results  Component Value Date   BUN 50* 07/02/2015      Lab Results  Component Value Date   CREATININE 1.5* 07/02/2015   CREATININE 1.4* 06/16/2015   CREATININE 1.3 06/04/2015    1. CKD stage 2 due to type 2 diabetes mellitus (HCC) - furosemide (LASIX) 20 MG tablet; One twice daily to control fluid retention  Dispense: 60 tablet; Refill: 5 -BMP in 1 week  2. Edema, unspecified type - furosemide (LASIX) 20 MG tablet; One twice daily to control fluid retention  Dispense: 60 tablet; Refill: 5

## 2015-07-07 ENCOUNTER — Ambulatory Visit (INDEPENDENT_AMBULATORY_CARE_PROVIDER_SITE_OTHER): Payer: Medicare Other | Admitting: Cardiovascular Disease

## 2015-07-07 ENCOUNTER — Encounter: Payer: Self-pay | Admitting: Cardiovascular Disease

## 2015-07-07 VITALS — BP 89/53 | HR 63 | Ht 68.0 in | Wt 152.6 lb

## 2015-07-07 DIAGNOSIS — I5022 Chronic systolic (congestive) heart failure: Secondary | ICD-10-CM | POA: Diagnosis not present

## 2015-07-07 DIAGNOSIS — E1122 Type 2 diabetes mellitus with diabetic chronic kidney disease: Secondary | ICD-10-CM | POA: Diagnosis not present

## 2015-07-07 DIAGNOSIS — I495 Sick sinus syndrome: Secondary | ICD-10-CM | POA: Diagnosis not present

## 2015-07-07 DIAGNOSIS — I509 Heart failure, unspecified: Secondary | ICD-10-CM | POA: Diagnosis not present

## 2015-07-07 DIAGNOSIS — N182 Chronic kidney disease, stage 2 (mild): Secondary | ICD-10-CM

## 2015-07-07 DIAGNOSIS — Z950 Presence of cardiac pacemaker: Secondary | ICD-10-CM | POA: Diagnosis not present

## 2015-07-07 DIAGNOSIS — I63039 Cerebral infarction due to thrombosis of unspecified carotid artery: Secondary | ICD-10-CM | POA: Diagnosis not present

## 2015-07-07 DIAGNOSIS — N183 Chronic kidney disease, stage 3 (moderate): Secondary | ICD-10-CM

## 2015-07-07 DIAGNOSIS — I50810 Right heart failure, unspecified: Secondary | ICD-10-CM

## 2015-07-07 MED ORDER — LOSARTAN POTASSIUM 50 MG PO TABS: 50.0000 mg | ORAL_TABLET | Freq: Every day | ORAL | Status: DC

## 2015-07-07 MED ORDER — FUROSEMIDE 40 MG PO TABS: 40.0000 mg | ORAL_TABLET | Freq: Every day | ORAL | Status: DC

## 2015-07-07 NOTE — Patient Instructions (Signed)
Medication Instructions:  Your physician has recommended you make the following change in your medication:  1- DECREASE losartan to 50mg  by mouth daily. 2- START taking furosemide 40mg  by mouth once a day.  Labwork: none  Testing/Procedures: none  Follow-Up: Remote monitoring is used to monitor your Pacemaker of ICD from home. This monitoring reduces the number of office visits required to check your device to one time per year. It allows [name] to keep an eye on the functioning of your device to ensure it is working properly. You are scheduled for a device check from home on Thursday, October 08, 2015. You may send your transmission at any time that day. If you have a wireless device, the transmission will be sent automatically. After your physician reviews your transmission, you will receive a postcard with your next transmission date.    Your physician wants you to follow-up in: [place]. You will receive a reminder letter in the mail two months in advance. If you don't receive a letter, please call our office to schedule the follow-up appointment.   Any Other Special Instructions Will Be Listed Below (If Applicable).     If you need a refill on your cardiac medications before your next appointment, please call your pharmacy.

## 2015-07-07 NOTE — Progress Notes (Signed)
Patient ID: Jon [name], male   DOB: 04/05/1921, 82 y.o.   MRN: [id number]     Cardiology Office Note    Date:  07/07/2015   ID:  Jon [name], DOB 08/01/1921, MRN [id number]  PCP:  GREEN, ARTHUR G, MD  Cardiologist:  Kenneth C Hilty, MD; CROITORU,MIHAI, MD   chief complaint: fatigue   History of Present Illness:  Jon [name] is a 82 y.o. male with congestive heart failure, permanent atrial fibrillation, history of previous stroke on chronic anticoagulation, CAD s/p CABG, COPD here for follow-up on his permanent pacemaker (initially implanted for sinus node dysfunction with symptomatic bradycardia, now programmed VVIR).  He last saw Dr. Hilty in January 2017.  Since his last appointment he has had fewer problems with edema. He reports that addition of spironolactone led to substantial improvement in his swelling. Recently, Dr. Green reduced his furosemide to 20 mg twice a day.   Today his blood pressure is quite low. He complains of fatigue but denies dizziness, syncope or falls. His most recent labs show a potassium of 4.7, creatinine of 1.54, BUN 50, sodium of 132 (April 20). He denies exertional dyspnea. He had a lot of problems with runny nose and cough, improved with inhalers and nasal ipratropium spray. He is also troubled by ectropion of his right eyelid.  Pacemaker interrogation shows normal device function. Battery voltage is 2.95 V with an estimation of roughly 3 years of generator longevity. Lead parameters are excellent. He is in permanent atrial fibrillation and his dual-chamber device is now programmed VVIR. He is very sedentary with only 0.2 hours/day of physical activity. He has roughly 50% ventricular sensed/50% ventricular paced rhythm. High ventricular rates are not a problem. Heart rate histogram distribution appears appropriate.  Past Medical History  Diagnosis Date  . Hypertension   . Coronary artery disease   . Arthritis   . Cataract   . Anxiety   .  Blood transfusion without reported diagnosis   . Hyperlipidemia   . Heart murmur   . Allergy   . Thyroid disease   . PAF (paroxysmal atrial fibrillation) (HCC)     coumadin  . History of abdominal aortic aneurysm   . History of echocardiogram 12/2008    EF >55%; mild concentric LVH; mild MR; mild-mod TR; mild AV regurg;   . History of nuclear stress test 12/2010    lexiscan; low risk; compared to prior study, perfusion improved  . History of Doppler ultrasound 05/22/2012    LEAs; R anterior tibial artery appeared occluded; L posterior tibial shows short segment of occlusive ds  . History of Doppler ultrasound 05/22/2012    Abdominal Aortic Doppler; slight increase in fusiform aneurysm   . Stroke (HCC)   . Adjustment disorder with mixed anxiety and depressed mood 04/18/2013    Post CVA depression and anxiety   . Abnormality of gait 04/18/2013  . 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 Increased exposure of the right cornea.   . Pruritic condition 01/24/2013  . Memory change 01/24/2013  . Abnormal PFTs 10/30/2012    Followed in Pulmonary clinic/ [facility]/ [place]  - PFT's 10/22/12 VC  55% no obst, DLCO 50%  - PFT's 12/20/2012 VC 83% and no obst, dLCO 55%  -11/08/2012  Walked RA x 3 laps @ 185 ft each stopped due to  End of study, not desat -11/08/12 esr 10    . Impotence of organic origin 10/03/2000  . Osteoarthrosis, unspecified   whether generalized or localized, unspecified site 02/05/2003  . Hypertrophy of prostate with urinary obstruction and other lower urinary tract symptoms (LUTS) 04/28/2004  . Gout, unspecified 11/03/2004  . Acute on chronic systolic congestive heart failure, NYHA class 2 ([place]) 03/16/2006    BNP 376.9 05/28/13   . Atherosclerosis of renal artery ([place]) 10/18/2006  . Unspecified vitamin D deficiency 10/18/2006  . Obstructive sleep apnea (adult) (pediatric) 07/23/2008  . Major depressive disorder, single episode, unspecified ([place]) 03/05/2009  . Basal  cell carcinoma of skin of other and unspecified parts of face 12/24/2009  . Internal hemorrhoids without mention of complication 41/66/0630  . Muscle weakness (generalized) 03/10/2011  . Pain in joint, lower leg 08/04/2011  . Nocturia 10/27/2011  . Aortic aneurysm of unspecified site without mention of rupture 10/27/2011  . Fall 03/04/2011  . Hyponatremia 03/04/2011  . TIA (transient ischemic attack) 04/30/2013  . Fatigue 04/18/2013  . 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 CVA - Rt brain stroke 12/14 02/18/2013    02/19/13 angiography CT head:  Diffuse atherosclerotic irregularity and plaque formation of the distal right common carotid artery and proximal internal carotid artery without significant stenosis. Plaque ulceration is present and is a source of emboli. A small right thalamic CVA    . Dyslipidemia 11/03/2004  . Type II or unspecified type diabetes mellitus without mention of complication, not stated as uncontrolled 11/24/2004  . Spinal stenosis in cervical region 12/15/2004  . Right bundle branch block 04/19/2006  . Cardiac pacemaker in situ- MDT 10/11 03/18/2010    Medtronic revo implant in October 2011. Severe sinus bradycardia in the 30s, but not truly pacemaker dependent   . Long term (current) use of anticoagulants 03/17/2011  . Hypothyroidism 03/04/2011  . HTN (hypertension) 03/04/2011  . Atrial fibrillation ([place]) 03/04/2011     He was on coumadin until his stroke last December and was switched to apixaban 2.60m daily which he has been taking faithfully without reported side effects.    . SSS (sick sinus syndrome) ([place] 04/08/2014  . DM (diabetes mellitus), type 2 with renal complications ([place] 41/60/1093 . CKD stage 2 due to type 2 diabetes mellitus ([place] 07/03/2014  . Osteoarthritis of both knees 07/31/2014  . Quadriceps weakness 07/31/2014  . Balance disorder 07/31/2014  . Open wound of knee, leg (except thigh), and ankle,  complicated 52/35/5732   Past Surgical History  Procedure Laterality Date  . Coronary artery bypass graft  1989  . Pacemaker insertion  2011  . Insert / replace / remove pacemaker    . Eye surgery    . Joint replacement    . Hernia repair    . Coronary angioplasty with stent placement  2008    stent to SVG to OM    Current Medications: Outpatient Prescriptions Prior to Visit  Medication Sig Dispense Refill  . acetaminophen (TYLENOL) 325 MG tablet Take 650 mg by mouth every morning.     .Marland Kitchenallopurinol (ZYLOPRIM) 100 MG tablet Take 50 mg by mouth daily.     .Marland Kitchenamoxicillin (AMOXIL) 500 MG capsule Take 500 mg by mouth. Take 4 caps prior to dental procedures    . apixaban (ELIQUIS) 2.5 MG TABS tablet Take 1 tablet (2.5 mg total) by mouth 2 (two) times daily. 60 tablet 11  . carboxymethylcellulose (REFRESH PLUS) 0.5 % SOLN Place 1 drop into both eyes every evening.    . Cholecalciferol (VITAMIN D)  400 UNITS capsule Take 400 Units by mouth 2 (two) times daily.     . hydrocortisone (PROCTOCORT) 1 % CREA Apply twice daily as needed to relieve hemorrhoid discomfort 28.35 g 5  . ipratropium (ATROVENT) 0.06 % nasal spray One puff in each nostril before each meal (Patient taking differently: Place 1 spray into both nostrils daily as needed for rhinitis. ) 15 mL 12  . ipratropium-albuterol (DUONEB) 0.5-2.5 (3) MG/3ML SOLN Take 3 mLs by nebulization 4 (four) times daily as needed.    . levothyroxine (SYNTHROID, LEVOTHROID) 125 MCG tablet Take 125 mcg by mouth daily before breakfast.    . LORazepam (ATIVAN) 0.5 MG tablet Take 0.25 mg by mouth at bedtime. Take 1 tab by mouth every four hours as needed    . metoprolol (LOPRESSOR) 100 MG tablet Take 100 mg by mouth 2 (two) times daily.    . nitroGLYCERIN (NITROSTAT) 0.4 MG SL tablet Place 0.4 mg under the tongue every 5 (five) minutes as needed for chest pain.     . Nutritional Supplements (BOOST COMPACT) LIQD Take 1 Container by mouth daily. 237 mls daily     . olopatadine (PATANOL) 0.1 % ophthalmic solution Place 1 drop into both eyes 2 (two) times daily.    . polyethylene glycol (MIRALAX / GLYCOLAX) packet Take 17 g by mouth daily.    . polyvinyl alcohol (LIQUIFILM TEARS) 1.4 % ophthalmic solution Place 1 drop into both eyes. One drop in both eyes twice daily as needed for dry eyes    . protein supplement (RESOURCE BENEPROTEIN) POWD Take 1 scoop by mouth daily as needed.    . spironolactone (ALDACTONE) 25 MG tablet Take 25 mg by mouth daily.    . tamsulosin (FLOMAX) 0.4 MG CAPS capsule Take one cap by mouth daily    . tobramycin-dexamethasone (TOBRADEX) ophthalmic ointment Place 1 application into both eyes 2 (two) times daily.    . furosemide (LASIX) 20 MG tablet One twice daily to control fluid retention 60 tablet 5  . losartan (COZAAR) 100 MG tablet Take 1 tablet (100 mg total) by mouth daily. 30 tablet 5   No facility-administered medications prior to visit.     Allergies:   Quinolones; Caduet; Crestor; Lipitor; Simvastatin; and Lisinopril   Social History   Social History  . Marital Status: Married    Spouse Name: [name]  . Number of Children: 3  . Years of Education: BA   Occupational History  . Retired Business Owner    Social History Main Topics  . Smoking status: Former Smoker -- 0.75 packs/day for 15 years    Types: Cigarettes    Quit date: 03/04/1967  . Smokeless tobacco: Never Used  . Alcohol Use: No  . Drug Use: No  . Sexual Activity: No   Other Topics Concern  . Not on file   Social History Narrative   Lives at [facility] since 03/2009, moved to [place] 04/2013   Widowed    Stopped smoking 1968   Alcohol none   Exercise  none   Walks with walker   Living Will, POA              Family History:  The patient's family history includes Diabetes in his father; Other in his mother.   ROS:   Please see the history of present illness.    ROS All other systems reviewed and are negative.   PHYSICAL EXAM:    VS:  BP 89/53 mmHg  Pulse 63    Ht 5' 8" (1.727 m)  Wt 69.219 kg (152 lb 9.6 oz)  BMI 23.21 kg/m2   GEN: Well nourished, well developed, in no acute distress HEENT: normal Neck: no JVD, carotid bruits, or masses Cardiac: Paradoxically split S2 RRR; no murmurs, rubs, or gallops,no edema  Respiratory:  clear to auscultation bilaterally, normal work of breathing GI: soft, nontender, nondistended, + BS MS: no deformity or atrophy Skin: warm and dry, no rash Neuro:  Alert and Oriented x 3, Strength and sensation are intact Psych: euthymic mood, full affect  Wt Readings from Last 3 Encounters:  07/07/15 69.219 kg (152 lb 9.6 oz)  06/18/15 68.13 kg (150 lb 3.2 oz)  06/11/15 68.13 kg (150 lb 3.2 oz)      Studies/Labs Reviewed:   EKG:  EKG is not ordered today.  The intracardiac electrogram today demonstrates atrial fibrillation, paced ventricular rhythm  Recent Labs: 02/19/2015: TSH 3.19 06/02/2015: Hemoglobin 15.8; Platelets 326 07/02/2015: ALT 25; BUN 50*; Creatinine 1.5*; Potassium 4.7; Sodium 132*   Lipid Panel    Component Value Date/Time   CHOL 117 05/01/2013 0528   TRIG 79 05/01/2013 0528   HDL 30* 05/01/2013 0528   CHOLHDL 3.9 05/01/2013 0528   VLDL 16 05/01/2013 0528   LDLCALC 71 05/01/2013 0528    ASSESSMENT:    1. Chronic systolic congestive heart failure ([place])   2. Hypotension  3. Type 2 diabetes mellitus with stage 3 chronic kidney disease, without long-term current use of ins  4. Long-term persistent atrial fibrillation   5. Cardiac pacemaker in situ- MDT 10/11      PLAN:  In order of problems listed above:  1. CHF: He has no edema whatsoever on the lower dose of loop diuretic and does not have symptoms of acute heart failure. I have recommended that he consolidate his furosemide to 40 mg once daily for convenience. 2. Hypotension: Reduce the dose of valsartan to 50 mg daily 3. DM/CKD: Discussed need to balance volume status between heart failure and  renal insufficiency 4. AFib: Rate control is adequate.  He is on a fairly hefty dose of beta blocker. Consider reducing this also if his low blood pressure remains a persistent problem. Continue Eliquis.  5. PPM: Normal device function, continue remote download Q3 months and yearly office visits. and I think we have achieved a reasonable compromise between avoiding ventricular pacing and avoiding high ventricular rates, but may reduce beta blocker dose as described above.    Medication Adjustments/Labs and Tests Ordered: Current medicines are reviewed at length with the patient today.  Concerns regarding medicines are outlined above.  Medication changes, Labs and Tests ordered today are listed in the Patient Instructions below. Patient Instructions  Medication Instructions:  Your physician has recommended you make the following change in your medication:  1- DECREASE losartan to 57m by mouth daily. 2- START taking furosemide 431mby mouth once a day.  Labwork: none  Testing/Procedures: none  Follow-Up: Remote monitoring is used to monitor your Pacemaker of ICD from home. This monitoring reduces the number of office visits required to check your device to one time per year. It allows [facility] keep an eye on the functioning of your device to ensure it is working properly. You are scheduled for a device check from home on Thursday, October 08, 2015. You may send your transmission at any time that day. If you have a wireless device, the transmission will be sent automatically. After your physician reviews your transmission, you will  receive a postcard with your next transmission date.    Your physician wants you to follow-up in: 12 MONTHS WITH DR CROITORU FOR PACER CHECK. You will receive a reminder letter in the mail two months in advance. If you don't receive a letter, please call our office to schedule the follow-up appointment.   Any Other Special Instructions Will Be Listed Below (If  Applicable).     If you need a refill on your cardiac medications before your next appointment, please call your pharmacy.      Signed, CROITORU,MIHAI, MD  07/07/2015 3:27 PM    [facility] [address], [place], [place]  [address] Phone: [phone]; Fax: [phone]    

## 2015-07-08 DIAGNOSIS — R29898 Other symptoms and signs involving the musculoskeletal system: Secondary | ICD-10-CM | POA: Diagnosis not present

## 2015-07-08 DIAGNOSIS — N3946 Mixed incontinence: Secondary | ICD-10-CM | POA: Diagnosis not present

## 2015-07-08 DIAGNOSIS — M6281 Muscle weakness (generalized): Secondary | ICD-10-CM | POA: Diagnosis not present

## 2015-07-08 DIAGNOSIS — I509 Heart failure, unspecified: Secondary | ICD-10-CM | POA: Diagnosis not present

## 2015-07-08 DIAGNOSIS — M25562 Pain in left knee: Secondary | ICD-10-CM | POA: Diagnosis not present

## 2015-07-08 DIAGNOSIS — I69954 Hemiplegia and hemiparesis following unspecified cerebrovascular disease affecting left non-dominant side: Secondary | ICD-10-CM | POA: Diagnosis not present

## 2015-07-08 DIAGNOSIS — R29818 Other symptoms and signs involving the nervous system: Secondary | ICD-10-CM | POA: Diagnosis not present

## 2015-07-08 DIAGNOSIS — R32 Unspecified urinary incontinence: Secondary | ICD-10-CM | POA: Diagnosis not present

## 2015-07-08 DIAGNOSIS — M2550 Pain in unspecified joint: Secondary | ICD-10-CM | POA: Diagnosis not present

## 2015-07-08 DIAGNOSIS — R2681 Unsteadiness on feet: Secondary | ICD-10-CM | POA: Diagnosis not present

## 2015-07-08 DIAGNOSIS — E039 Hypothyroidism, unspecified: Secondary | ICD-10-CM | POA: Diagnosis not present

## 2015-07-08 DIAGNOSIS — D472 Monoclonal gammopathy: Secondary | ICD-10-CM | POA: Diagnosis not present

## 2015-07-08 DIAGNOSIS — R2689 Other abnormalities of gait and mobility: Secondary | ICD-10-CM | POA: Diagnosis not present

## 2015-07-08 DIAGNOSIS — I4891 Unspecified atrial fibrillation: Secondary | ICD-10-CM | POA: Diagnosis not present

## 2015-07-09 DIAGNOSIS — I5022 Chronic systolic (congestive) heart failure: Secondary | ICD-10-CM | POA: Diagnosis not present

## 2015-07-09 DIAGNOSIS — R32 Unspecified urinary incontinence: Secondary | ICD-10-CM | POA: Diagnosis not present

## 2015-07-09 DIAGNOSIS — R2681 Unsteadiness on feet: Secondary | ICD-10-CM | POA: Diagnosis not present

## 2015-07-09 DIAGNOSIS — N3946 Mixed incontinence: Secondary | ICD-10-CM | POA: Diagnosis not present

## 2015-07-09 DIAGNOSIS — R2689 Other abnormalities of gait and mobility: Secondary | ICD-10-CM | POA: Diagnosis not present

## 2015-07-09 DIAGNOSIS — M6281 Muscle weakness (generalized): Secondary | ICD-10-CM | POA: Diagnosis not present

## 2015-07-09 DIAGNOSIS — R29898 Other symptoms and signs involving the musculoskeletal system: Secondary | ICD-10-CM | POA: Diagnosis not present

## 2015-07-09 LAB — BASIC METABOLIC PANEL
BUN: 32 mg/dL — AB (ref 4–21)
CREATININE: 1.1 mg/dL (ref <=1.3)
Glucose: 86 mg/dL
POTASSIUM: 4.3 mmol/L (ref 3.4–5.3)
Sodium: 137 mmol/L (ref 137–147)

## 2015-07-14 DIAGNOSIS — R32 Unspecified urinary incontinence: Secondary | ICD-10-CM | POA: Diagnosis not present

## 2015-07-14 DIAGNOSIS — R2681 Unsteadiness on feet: Secondary | ICD-10-CM | POA: Diagnosis not present

## 2015-07-14 DIAGNOSIS — N3946 Mixed incontinence: Secondary | ICD-10-CM | POA: Diagnosis not present

## 2015-07-14 DIAGNOSIS — R29898 Other symptoms and signs involving the musculoskeletal system: Secondary | ICD-10-CM | POA: Diagnosis not present

## 2015-07-14 DIAGNOSIS — M6281 Muscle weakness (generalized): Secondary | ICD-10-CM | POA: Diagnosis not present

## 2015-07-14 DIAGNOSIS — R2689 Other abnormalities of gait and mobility: Secondary | ICD-10-CM | POA: Diagnosis not present

## 2015-07-16 DIAGNOSIS — N3946 Mixed incontinence: Secondary | ICD-10-CM | POA: Diagnosis not present

## 2015-07-16 DIAGNOSIS — M6281 Muscle weakness (generalized): Secondary | ICD-10-CM | POA: Diagnosis not present

## 2015-07-16 DIAGNOSIS — R32 Unspecified urinary incontinence: Secondary | ICD-10-CM | POA: Diagnosis not present

## 2015-07-16 DIAGNOSIS — R29898 Other symptoms and signs involving the musculoskeletal system: Secondary | ICD-10-CM | POA: Diagnosis not present

## 2015-07-16 DIAGNOSIS — R2689 Other abnormalities of gait and mobility: Secondary | ICD-10-CM | POA: Diagnosis not present

## 2015-07-16 DIAGNOSIS — R2681 Unsteadiness on feet: Secondary | ICD-10-CM | POA: Diagnosis not present

## 2015-07-21 DIAGNOSIS — R2689 Other abnormalities of gait and mobility: Secondary | ICD-10-CM | POA: Diagnosis not present

## 2015-07-21 DIAGNOSIS — N3946 Mixed incontinence: Secondary | ICD-10-CM | POA: Diagnosis not present

## 2015-07-21 DIAGNOSIS — M6281 Muscle weakness (generalized): Secondary | ICD-10-CM | POA: Diagnosis not present

## 2015-07-21 DIAGNOSIS — R2681 Unsteadiness on feet: Secondary | ICD-10-CM | POA: Diagnosis not present

## 2015-07-21 DIAGNOSIS — R29898 Other symptoms and signs involving the musculoskeletal system: Secondary | ICD-10-CM | POA: Diagnosis not present

## 2015-07-21 DIAGNOSIS — R32 Unspecified urinary incontinence: Secondary | ICD-10-CM | POA: Diagnosis not present

## 2015-07-23 DIAGNOSIS — R32 Unspecified urinary incontinence: Secondary | ICD-10-CM | POA: Diagnosis not present

## 2015-07-23 DIAGNOSIS — N3946 Mixed incontinence: Secondary | ICD-10-CM | POA: Diagnosis not present

## 2015-07-23 DIAGNOSIS — R29898 Other symptoms and signs involving the musculoskeletal system: Secondary | ICD-10-CM | POA: Diagnosis not present

## 2015-07-23 DIAGNOSIS — R2689 Other abnormalities of gait and mobility: Secondary | ICD-10-CM | POA: Diagnosis not present

## 2015-07-23 DIAGNOSIS — R2681 Unsteadiness on feet: Secondary | ICD-10-CM | POA: Diagnosis not present

## 2015-07-23 DIAGNOSIS — M6281 Muscle weakness (generalized): Secondary | ICD-10-CM | POA: Diagnosis not present

## 2015-07-25 LAB — CUP PACEART INCLINIC DEVICE CHECK
Brady Statistic RV Percent Paced: 47.29 %
Date Time Interrogation Session: 20170502142516
Implantable Lead Location: 753860
Implantable Lead Model: 5086
Implantable Lead Model: 5086
Lead Channel Impedance Value: 424 Ohm
Lead Channel Sensing Intrinsic Amplitude: 8.125 mV
Lead Channel Setting Sensing Sensitivity: 0.9 mV
MDC IDC LEAD IMPLANT DT: 20111021
MDC IDC LEAD IMPLANT DT: 20111021
MDC IDC LEAD LOCATION: 753859
MDC IDC MSMT BATTERY VOLTAGE: 2.95 V
MDC IDC MSMT LEADCHNL RA IMPEDANCE VALUE: 416 Ohm
MDC IDC MSMT LEADCHNL RA SENSING INTR AMPL: 0.836 mV
MDC IDC SET LEADCHNL RV PACING AMPLITUDE: 2.5 V
MDC IDC SET LEADCHNL RV PACING PULSEWIDTH: 0.4 ms

## 2015-08-05 ENCOUNTER — Encounter: Payer: Self-pay | Admitting: Cardiovascular Disease

## 2015-08-05 DIAGNOSIS — R29898 Other symptoms and signs involving the musculoskeletal system: Secondary | ICD-10-CM | POA: Diagnosis not present

## 2015-08-05 DIAGNOSIS — R2681 Unsteadiness on feet: Secondary | ICD-10-CM | POA: Diagnosis not present

## 2015-08-05 DIAGNOSIS — R32 Unspecified urinary incontinence: Secondary | ICD-10-CM | POA: Diagnosis not present

## 2015-08-05 DIAGNOSIS — N3946 Mixed incontinence: Secondary | ICD-10-CM | POA: Diagnosis not present

## 2015-08-05 DIAGNOSIS — M6281 Muscle weakness (generalized): Secondary | ICD-10-CM | POA: Diagnosis not present

## 2015-08-05 DIAGNOSIS — R2689 Other abnormalities of gait and mobility: Secondary | ICD-10-CM | POA: Diagnosis not present

## 2015-08-13 ENCOUNTER — Non-Acute Institutional Stay: Payer: Medicare Other | Admitting: Nurse Practitioner

## 2015-08-13 ENCOUNTER — Encounter: Payer: Self-pay | Admitting: Nurse Practitioner

## 2015-08-13 DIAGNOSIS — I5022 Chronic systolic (congestive) heart failure: Secondary | ICD-10-CM

## 2015-08-13 DIAGNOSIS — F0631 Mood disorder due to known physiological condition with depressive features: Secondary | ICD-10-CM

## 2015-08-13 DIAGNOSIS — F329 Major depressive disorder, single episode, unspecified: Secondary | ICD-10-CM | POA: Diagnosis not present

## 2015-08-13 DIAGNOSIS — N182 Chronic kidney disease, stage 2 (mild): Secondary | ICD-10-CM

## 2015-08-13 DIAGNOSIS — I10 Essential (primary) hypertension: Secondary | ICD-10-CM

## 2015-08-13 DIAGNOSIS — K5900 Constipation, unspecified: Secondary | ICD-10-CM

## 2015-08-13 DIAGNOSIS — E1122 Type 2 diabetes mellitus with diabetic chronic kidney disease: Secondary | ICD-10-CM

## 2015-08-13 DIAGNOSIS — M1000 Idiopathic gout, unspecified site: Secondary | ICD-10-CM

## 2015-08-13 DIAGNOSIS — M150 Primary generalized (osteo)arthritis: Secondary | ICD-10-CM

## 2015-08-13 DIAGNOSIS — I481 Persistent atrial fibrillation: Secondary | ICD-10-CM

## 2015-08-13 DIAGNOSIS — K219 Gastro-esophageal reflux disease without esophagitis: Secondary | ICD-10-CM | POA: Diagnosis not present

## 2015-08-13 DIAGNOSIS — I4819 Other persistent atrial fibrillation: Secondary | ICD-10-CM

## 2015-08-13 DIAGNOSIS — E039 Hypothyroidism, unspecified: Secondary | ICD-10-CM

## 2015-08-13 DIAGNOSIS — IMO0002 Reserved for concepts with insufficient information to code with codable children: Secondary | ICD-10-CM

## 2015-08-13 DIAGNOSIS — N401 Enlarged prostate with lower urinary tract symptoms: Secondary | ICD-10-CM

## 2015-08-13 DIAGNOSIS — M159 Polyosteoarthritis, unspecified: Secondary | ICD-10-CM

## 2015-08-13 DIAGNOSIS — I639 Cerebral infarction, unspecified: Secondary | ICD-10-CM

## 2015-08-13 DIAGNOSIS — E1151 Type 2 diabetes mellitus with diabetic peripheral angiopathy without gangrene: Secondary | ICD-10-CM

## 2015-08-13 NOTE — Assessment & Plan Note (Signed)
Stable, continue Benefiber and prn MiraLax

## 2015-08-13 NOTE — Assessment & Plan Note (Signed)
06/16/15 Na 136, K 4.6, Bun 55, creat 1.39 07/09/15 Na 137, K 4.3, Bun 32, creat 1.09

## 2015-08-13 NOTE — Assessment & Plan Note (Signed)
Controlled, continue Metoprolol 100 mg twice a day, Losartan 100mg , Furosemide 40mg  bid, Spironolactone 25mg 

## 2015-08-13 NOTE — Assessment & Plan Note (Signed)
stable °

## 2015-08-13 NOTE — Progress Notes (Signed)
Patient ID: Jon [name], male   DOB: 12/31/1921, 82 y.o.   MRN: [id number]  Location:  [facility] Room Number: [place]: [place] Provider:  Lennie Odor Leontae Bostock NP  [name], MD  Patient Care Team: [name], MD as PCP - General (Internal Medicine) [facility] Pixie Casino, MD as Consulting Physician (Cardiology) Ardis Hughs, MD as Attending Physician (Urology)  Extended Emergency Contact Information Primary Emergency Contact: [name] of [place] Phone: [phone] Relation: Daughter Secondary Emergency Contact: Eyvonne Mechanic [place] of [place] Phone: [phone] Mobile Phone: [phone] Relation: Daughter  Code Status:  DNR Goals of care: Advanced Directive information Advanced Directives 08/13/2015  Does patient have an advance directive? Yes  Type of Advance Directive Out of facility DNR (pink MOST or yellow form);Living will;Healthcare Power of Attorney  Does patient want to make changes to advanced directive? No - Patient declined  Copy of advanced directive(s) in chart? Yes     Chief Complaint  Patient presents with  . Medical Management of Chronic Issues   HPI:  Pt is a 82 y.o. male seen today for medical management of chronic diseases.  Weight loss about #10Ibs in the past 2-3 months, decreased appetite, Spironolactone '25mg'$  added about a week ago. urineary frequently 5-6x/last night, he wants to wait and see if UA C/S needed. Also desires delaying Mirtazapine 7.'5mg'$  for appetite today.   Hx of hypothyroidism, takingLevothyroxine 129mg, TSH wnl 3.19 02/19/15. CVA, CHF, HTN, anxiety, urinary frequency.   CHF: less SOB, dry rales back of lungs, on Furosemide '40mg'$  bid, Spironolactone '25mg'$ .   Hypothyroidism, taking Levothyroxine 1269m, last TSH wnl  BPH: no urinary retention, but dribbling, taking Tamsulosin 0.'4mg'$  daily  Gout: no flare ups, taking  Allopurinol '50mg'$   Afib/late effect of CVA: heart rate is in control, left facial weakness, ambulates with walker, taking Eliquis   Past Medical History  Diagnosis Date  . Hypertension   . Coronary artery disease   . Arthritis   . Cataract   . Anxiety   . Blood transfusion without reported diagnosis   . Hyperlipidemia   . Heart murmur   . Allergy   . Thyroid disease   . PAF (paroxysmal atrial fibrillation) (HCC)     coumadin  . History of abdominal aortic aneurysm   . History of echocardiogram 12/2008    EF >55%; mild concentric LVH; mild MR; mild-mod TR; mild AV regurg;   . History of nuclear stress test 12/2010    lexiscan; low risk; compared to prior study, perfusion improved  . History of Doppler ultrasound 05/22/2012    LEAs; R anterior tibial artery appeared occluded; L posterior tibial shows short segment of occlusive ds  . History of Doppler ultrasound 05/22/2012    Abdominal Aortic Doppler; slight increase in fusiform aneurysm   . [place]  . Adjustment disorder with mixed anxiety and depressed mood 04/18/2013    Post CVA depression and anxiety   . Abnormality of gait 04/18/2013  . 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 Increased exposure of the right cornea.   . Pruritic condition 01/24/2013  . Memory change 01/24/2013  . Abnormal PFTs 10/30/2012    Followed in Pulmonary clinic/ [facility]/ [place]  - PFT's 10/22/12 VC  55% no obst, DLCO 50%  - PFT's 12/20/2012 VC 83% and no obst, dLCO 55%  -11/08/2012  Walked  RA x 3 laps @ 185 ft each stopped due to  End of study, not desat -11/08/12 esr 10    . Impotence of organic origin 10/03/2000  . Osteoarthrosis, unspecified whether generalized or localized, unspecified site 02/05/2003  . Hypertrophy of prostate with urinary obstruction and other lower urinary tract symptoms (LUTS) 04/28/2004  . Gout, unspecified 11/03/2004  . Acute on chronic systolic congestive heart failure, NYHA class 2 ([place]) 03/16/2006     BNP 376.9 05/28/13   . Atherosclerosis of renal artery ([place]) 10/18/2006  . Unspecified vitamin D deficiency 10/18/2006  . Obstructive sleep apnea (adult) (pediatric) 07/23/2008  . Major depressive disorder, single episode, unspecified ([place]) 03/05/2009  . Basal cell carcinoma of skin of other and unspecified parts of face 12/24/2009  . Internal hemorrhoids without mention of complication 54/62/7035  . Muscle weakness (generalized) 03/10/2011  . Pain in joint, lower leg 08/04/2011  . Nocturia 10/27/2011  . Aortic aneurysm of unspecified site without mention of rupture 10/27/2011  . Fall 03/04/2011  . Hyponatremia 03/04/2011  . TIA (transient ischemic attack) 04/30/2013  . Fatigue 04/18/2013  . 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 CVA - Rt brain stroke 12/14 02/18/2013    02/19/13 angiography CT head:  Diffuse atherosclerotic irregularity and plaque formation of the distal right common carotid artery and proximal internal carotid artery without significant stenosis. Plaque ulceration is present and is a source of emboli. A small right thalamic CVA    . Dyslipidemia 11/03/2004  . Type II or unspecified type diabetes mellitus without mention of complication, not stated as uncontrolled 11/24/2004  . Spinal stenosis in cervical region 12/15/2004  . Right bundle branch block 04/19/2006  . Cardiac pacemaker in situ- MDT 10/11 03/18/2010    Medtronic revo implant in October 2011. Severe sinus bradycardia in the 30s, but not truly pacemaker dependent   . Long term (current) use of anticoagulants 03/17/2011  . Hypothyroidism 03/04/2011  . HTN (hypertension) 03/04/2011  . Atrial fibrillation ([place]) 03/04/2011     He was on coumadin until his stroke last December and was switched to apixaban 2.'5mg'$  daily which he has been taking faithfully without reported side effects.    . SSS (sick sinus syndrome) ([place]) 04/08/2014  . DM (diabetes mellitus), type 2 with renal  complications ([place]) 0/11/3816  . CKD stage 2 due to type 2 diabetes mellitus ([place]) 07/03/2014  . Osteoarthritis of both knees 07/31/2014  . Quadriceps weakness 07/31/2014  . Balance disorder 07/31/2014  . Open wound of knee, leg (except thigh), and ankle, complicated 2/99/3716   Past Surgical History  Procedure Laterality Date  . Coronary artery bypass graft  1989  . Pacemaker insertion  2011  . Insert / replace / remove pacemaker    . Eye surgery    . Joint replacement    . Hernia repair    . Coronary angioplasty with stent placement  2008    stent to SVG to OM    Allergies  Allergen Reactions  . Quinolones Other (See Comments)    Risk of rupture of AAA  . Caduet [Amlodipine-Atorvastatin] Other (See Comments)    Weakness   . Crestor [Rosuvastatin] Other (See Comments)    Muscle pain/weakness  . Lipitor [Atorvastatin Calcium] Other (See Comments)    Muscle pain/weakness  . Simvastatin Other (See Comments)    Muscle pain/weakness  . Lisinopril Other (See Comments)    Doesn't remember reaction  Medication List       This list is accurate as of: 08/13/15 11:59 PM.  Always use your most recent med list.               acetaminophen 325 MG tablet  Commonly known as:  TYLENOL  Take 650 mg by mouth every morning.     allopurinol 100 MG tablet  Commonly known as:  ZYLOPRIM  Take 50 mg by mouth daily.     amoxicillin 500 MG capsule  Commonly known as:  AMOXIL  Take 500 mg by mouth. Take 4 caps prior to dental procedures     apixaban 2.5 MG Tabs tablet  Commonly known as:  ELIQUIS  Take 1 tablet (2.5 mg total) by mouth 2 (two) times daily.     BOOST COMPACT Liqd  Take 1 Container by mouth daily. 237 mls daily     carboxymethylcellulose 0.5 % Soln  Commonly known as:  REFRESH PLUS  Place 1 drop into both eyes every evening.     furosemide 20 MG tablet  Commonly known as:  LASIX  Take 20 mg by mouth daily. If weight gain 3 lbs or more in a 24 hour or 5 lbs in 1  week .     hydrocortisone 1 % Crea  Commonly known as:  PROCTOCORT  Apply twice daily as needed to relieve hemorrhoid discomfort     ipratropium 0.06 % nasal spray  Commonly known as:  ATROVENT  One puff in each nostril before each meal     ipratropium-albuterol 0.5-2.5 (3) MG/3ML Soln  Commonly known as:  DUONEB  Take 3 mLs by nebulization 4 (four) times daily as needed.     levothyroxine 125 MCG tablet  Commonly known as:  SYNTHROID, LEVOTHROID  Take 125 mcg by mouth daily before breakfast.     LORazepam 0.5 MG tablet  Commonly known as:  ATIVAN  Take 0.25 mg by mouth at bedtime. Take 1 tab by mouth every four hours as needed     losartan 50 MG tablet  Commonly known as:  COZAAR  Take 1 tablet (50 mg total) by mouth daily.     metoprolol 100 MG tablet  Commonly known as:  LOPRESSOR  Take 100 mg by mouth 2 (two) times daily.     nitroGLYCERIN 0.4 MG SL tablet  Commonly known as:  NITROSTAT  Place 0.4 mg under the tongue every 5 (five) minutes as needed for chest pain.     olopatadine 0.1 % ophthalmic solution  Commonly known as:  PATANOL  Place 1 drop into both eyes 2 (two) times daily.     OXYGEN  Inhale 2 L/min into the lungs as needed.     polyethylene glycol packet  Commonly known as:  MIRALAX / GLYCOLAX  Take 17 g by mouth daily.     polyvinyl alcohol 1.4 % ophthalmic solution  Commonly known as:  LIQUIFILM TEARS  Place 1 drop into both eyes. One drop in both eyes twice daily as needed for dry eyes     protein supplement Powd  Take 1 scoop by mouth daily as needed.     spironolactone 25 MG tablet  Commonly known as:  ALDACTONE  Take 25 mg by mouth daily.     tamsulosin 0.4 MG Caps capsule  Commonly known as:  FLOMAX  Take one cap by mouth daily     tobramycin-dexamethasone ophthalmic ointment  Commonly known as:  TOBRADEX  Place 1 application into both  eyes 2 (two) times daily.     Vitamin D 400 units capsule  Take 400 Units by mouth 2 (two)  times daily.        Review of Systems  Constitutional: Positive for unexpected weight change. Negative for fever.       Generalized weakness  HENT: Positive for congestion and hearing loss. Negative for ear discharge, ear pain and tinnitus.   Eyes: Negative.   Respiratory: Positive for cough and shortness of breath. Negative for wheezing.   Cardiovascular: Positive for leg swelling (sometimes, not apparent today. ). Negative for palpitations.       Trace edema BLE, R>L  Gastrointestinal: Negative for abdominal pain and diarrhea.       Chokes on swallowing since his CVA. Episodes of fecal incontinence. Hx of hemorrhoids.A tender Pilonidal cyst like growth at the right side of anus   Genitourinary: Positive for urgency and frequency.       Incontinent from time to time. Followed by Dr. Matilde Sprang.  Musculoskeletal: Positive for back pain. Negative for neck pain.  Skin: Negative for rash.  Neurological: Positive for weakness.       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 Recurrent CVA on 04/30/13 of the left MCA area Left facial weakness  Psychiatric/Behavioral: The patient is not nervous/anxious.     Immunization History  Administered Date(s) Administered  . Influenza Whole 12/06/2011, 12/19/2012  . Influenza-Unspecified 01/09/2014, 11/25/2014  . PPD Test 05/03/2013  . Pneumococcal Polysaccharide-23 03/07/1986  . Td 04/20/2005   Pertinent  Health Maintenance Due  Topic Date Due  . OPHTHALMOLOGY EXAM  01/12/1934  . PNA vac Low Risk Adult (1 of 2 - PCV13) 01/12/1989  . FOOT EXAM  07/31/2015  . INFLUENZA VACCINE  10/06/2015  . HEMOGLOBIN A1C  01/01/2016   Fall Risk  06/11/2015 10/23/2014 07/07/2014 02/17/2014 09/13/2012  Falls in the past year? Yes Yes Yes Exclusion - non ambulatory No  Number falls in past yr: '1 1 1 '$ - -  Injury with Fall? Yes - No - -  Risk for fall due to : - - History of fall(s);Impaired mobility;Impaired  balance/gait - -  Follow up - - Falls evaluation completed;Falls prevention discussed - -   Functional Status Survey:    Filed Vitals:   08/13/15 0956  BP: 108/60  Pulse: 62  Temp: 97.7 F (36.5 C)  TempSrc: Oral  Resp: 16  Height: '5\' 8"'$  (1.727 m)  Weight: 153 lb 9.6 oz (69.673 kg)   Body mass index is 23.36 kg/(m^2). Physical Exam  Constitutional: He is oriented to person, place, and time. He appears well-developed and well-nourished. No distress.  HENT:  Severe hearing loss. Bilateral aides. Right facial droop.   Eyes:  Corrective lenses. Increased exposure of the right cornea due to post CVA facial droop. Irregular pupil on the left  Neck: No JVD present. No tracheal deviation present. No thyromegaly present.  Cardiovascular: Normal rate and normal heart sounds.  Frequent extrasystoles are present.  No murmur heard. Pacemaker left upper chest  Pulmonary/Chest: Effort normal. He has no wheezes. He has rales.  dry rales posterior lung bases.   Abdominal: Soft. Bowel sounds are normal. He exhibits no distension and no mass. There is no tenderness.  Genitourinary:  .A tender Pilonidal cyst like growth at the right side of anus  Musculoskeletal: Normal range of motion. He exhibits edema. He exhibits no tenderness.  Unstable gait. Using 4 wheel walker and w/c. Weaker on the  right side. Hx of the right total knee replacement. Trace edema BLE, R>L. C/o left knee pain, f/u Ortho. Motorized w/c to go further  Lymphadenopathy:    He has no cervical adenopathy.  Neurological: He is alert and oriented to person, place, and time. He displays normal reflexes. No cranial nerve deficit. He exhibits normal muscle tone. Coordination normal.  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 Recurrent CVA on 04/30/13 of the left MCA area Left facial weakness  Skin: Skin is warm and dry. No rash noted. No erythema. No pallor.  Psychiatric: He has a normal  mood and affect. His behavior is normal. Judgment and thought content normal.  Nursing note and vitals reviewed.   Labs reviewed:  Recent Labs  06/16/15 07/02/15 07/09/15  NA 136* 132* 137  K 4.8 4.7 4.3  BUN 55* 50* 32*  CREATININE 1.4* 1.5* 1.1    Recent Labs  06/02/15 06/04/15 07/02/15  AST '17 18 27  '$ ALT '11 11 25  '$ ALKPHOS 103 94 100    Recent Labs  11/25/14 01/16/15 06/02/15  WBC 7.2 7.1 6.9  HGB 15.0 16.0 15.8  HCT 44 48 46  PLT 200 180 326   Lab Results  Component Value Date   TSH 3.19 02/19/2015   Lab Results  Component Value Date   HGBA1C 6.9 07/02/2015   Lab Results  Component Value Date   CHOL 117 05/01/2013   HDL 30* 05/01/2013   LDLCALC 71 05/01/2013   TRIG 79 05/01/2013   CHOLHDL 3.9 05/01/2013    Significant Diagnostic Results in last 30 days:  No results found.  Assessment/Plan  Atrial fibrillation Rate controlled, continue Metoprolol '100mg'$  bid. Eliquis for VTE risk reduction.  HTN (hypertension) Controlled, continue Metoprolol 100 mg twice a day, Losartan '100mg'$ , Furosemide '40mg'$  bid, Spironolactone '25mg'$    Type 2 diabetes mellitus with circulatory disorder Diet controlled.   Chronic systolic congestive heart failure ([place]) compensated clinically, continue Furosemide '40mg'$  bid, prn Furosemide. Spironolactone '25mg'$ .  GERD (gastroesophageal reflux disease) stable  Constipation Stable, continue Benefiber and prn MiraLax  Hypothyroidism 09/16/14 TSH 2.965 01/15/15 7.202 02/19/15 TSH 3.194 06/16/15 TSH 1.86 Continue Levothyroxine 11mg  Osteoarthritis S/p left knee inj, continue Tylenol '650mg'$  bid.   CKD stage 2 due to type 2 diabetes mellitus ([place] 06/16/15 Na 136, K 4.6, Bun 55, creat 1.39 07/09/15 Na 137, K 4.3, Bun 32, creat 1.09  Enlarged prostate with lower urinary tract symptoms (LUTS) urinary frequency last night, continue Tamsulosin 0.'4mg'$    Depression due to stroke Stable. Continue Lorazepam  Edema 07/09/15 Na 137. K 4.3,  Bun 32, creat 1.09   Major depressive disorder, single episode, unspecified Stable, Continue Lorazepam 0.'25mg'$  qhs  Gout No flare ups. continue Allopurinol '50mg'$  daily.    Family/ staff Communication: continue AL for care needs, mobility, walker for short distance and motorized w/c to go further.   Labs/tests ordered: none

## 2015-08-13 NOTE — Assessment & Plan Note (Signed)
Stable. Continue Lorazepam

## 2015-08-13 NOTE — Assessment & Plan Note (Signed)
07/09/15 Na 137. K 4.3, Bun 32, creat 1.09

## 2015-08-13 NOTE — Assessment & Plan Note (Signed)
compensated clinically, continue Furosemide 40mg  bid, prn Furosemide. Spironolactone 25mg .

## 2015-08-13 NOTE — Assessment & Plan Note (Signed)
Rate controlled, continue Metoprolol 100mg  bid. Eliquis for VTE risk reduction.

## 2015-08-13 NOTE — Assessment & Plan Note (Signed)
Diet controlled.  

## 2015-08-13 NOTE — Assessment & Plan Note (Signed)
09/16/14 TSH 2.965 01/15/15 7.202 02/19/15 TSH 3.194 06/16/15 TSH 1.86 Continue Levothyroxine 157mcg

## 2015-08-13 NOTE — Assessment & Plan Note (Signed)
urinary frequency last night, continue Tamsulosin 0.4mg 

## 2015-08-13 NOTE — Assessment & Plan Note (Signed)
Stable, Continue Lorazepam 0.25mg  qhs

## 2015-08-13 NOTE — Assessment & Plan Note (Signed)
No flare ups. continue Allopurinol 50mg daily.  

## 2015-08-13 NOTE — Assessment & Plan Note (Signed)
S/p left knee inj, continue Tylenol 650mg  bid.

## 2015-09-24 ENCOUNTER — Encounter: Payer: Self-pay | Admitting: Internal Medicine

## 2015-09-24 ENCOUNTER — Non-Acute Institutional Stay: Payer: Medicare Other | Admitting: Internal Medicine

## 2015-09-24 VITALS — BP 128/72 | HR 68 | Temp 97.6°F | Ht 68.0 in | Wt 159.0 lb

## 2015-09-24 DIAGNOSIS — H02103 Unspecified ectropion of right eye, unspecified eyelid: Secondary | ICD-10-CM | POA: Diagnosis not present

## 2015-09-24 DIAGNOSIS — E039 Hypothyroidism, unspecified: Secondary | ICD-10-CM

## 2015-09-24 DIAGNOSIS — I481 Persistent atrial fibrillation: Secondary | ICD-10-CM

## 2015-09-24 DIAGNOSIS — R05 Cough: Secondary | ICD-10-CM

## 2015-09-24 DIAGNOSIS — I5022 Chronic systolic (congestive) heart failure: Secondary | ICD-10-CM | POA: Diagnosis not present

## 2015-09-24 DIAGNOSIS — N182 Chronic kidney disease, stage 2 (mild): Secondary | ICD-10-CM

## 2015-09-24 DIAGNOSIS — IMO0002 Reserved for concepts with insufficient information to code with codable children: Secondary | ICD-10-CM

## 2015-09-24 DIAGNOSIS — R059 Cough, unspecified: Secondary | ICD-10-CM

## 2015-09-24 DIAGNOSIS — I4819 Other persistent atrial fibrillation: Secondary | ICD-10-CM

## 2015-09-24 DIAGNOSIS — L299 Pruritus, unspecified: Secondary | ICD-10-CM

## 2015-09-24 DIAGNOSIS — R609 Edema, unspecified: Secondary | ICD-10-CM

## 2015-09-24 DIAGNOSIS — R4689 Other symptoms and signs involving appearance and behavior: Secondary | ICD-10-CM | POA: Diagnosis not present

## 2015-09-24 DIAGNOSIS — I639 Cerebral infarction, unspecified: Secondary | ICD-10-CM

## 2015-09-24 DIAGNOSIS — R159 Full incontinence of feces: Secondary | ICD-10-CM

## 2015-09-24 DIAGNOSIS — I10 Essential (primary) hypertension: Secondary | ICD-10-CM | POA: Diagnosis not present

## 2015-09-24 DIAGNOSIS — N183 Chronic kidney disease, stage 3 unspecified: Secondary | ICD-10-CM

## 2015-09-24 DIAGNOSIS — Z7901 Long term (current) use of anticoagulants: Secondary | ICD-10-CM | POA: Diagnosis not present

## 2015-09-24 DIAGNOSIS — F0631 Mood disorder due to known physiological condition with depressive features: Secondary | ICD-10-CM

## 2015-09-24 DIAGNOSIS — E1122 Type 2 diabetes mellitus with diabetic chronic kidney disease: Secondary | ICD-10-CM | POA: Diagnosis not present

## 2015-09-24 DIAGNOSIS — N3942 Incontinence without sensory awareness: Secondary | ICD-10-CM

## 2015-09-24 DIAGNOSIS — I63039 Cerebral infarction due to thrombosis of unspecified carotid artery: Secondary | ICD-10-CM

## 2015-09-24 DIAGNOSIS — R300 Dysuria: Secondary | ICD-10-CM

## 2015-09-24 DIAGNOSIS — J3489 Other specified disorders of nose and nasal sinuses: Secondary | ICD-10-CM

## 2015-09-24 MED ORDER — TRIAMCINOLONE ACETONIDE 0.1 % EX CREA: TOPICAL_CREAM | CUTANEOUS | Status: DC

## 2015-09-24 MED ORDER — ALIGN PO CAPS: ORAL_CAPSULE | ORAL | Status: DC

## 2015-09-24 NOTE — Progress Notes (Signed)
Patient ID: Jon [name], male   DOB: 01-08-1922, 82 y.o.   MRN: [id number]    [place] Room Number: [phone]  Place of Service: Clinic (12)     Allergies  Allergen Reactions  . Quinolones Other (See Comments)    Risk of rupture of AAA  . Caduet [Amlodipine-Atorvastatin] Other (See Comments)    Weakness   . Crestor [Rosuvastatin] Other (See Comments)    Muscle pain/weakness  . Lipitor [Atorvastatin Calcium] Other (See Comments)    Muscle pain/weakness  . Simvastatin Other (See Comments)    Muscle pain/weakness  . Lisinopril Other (See Comments)    Doesn't remember reaction    Chief Complaint  Patient presents with  . Medical Management of Chronic Issues    3 month medication management blood sugar, blood pressure, thyroid, CHF    HPI:  Dry cough unrelated to swallowing.  Sleepy feelings. Nancy Fetter bothers his eyes.  Discontinued the Ipratropium due to drying of his eyes as well as the nose.   Using Sooth eye drops.   Right eye is not closing and he plans to see Dr. Zigmund Daniel about this.  Dysuria , burning with urination, and increased odor. UA done this morning,  Remains incontinent. Had PT and instructi0n in bladder exercises, which have helped a little. Sometimes has fecal action when his bladder works.   Complains of the frequency of his BM and gas. Willing to try probiotic.  Rash on the groin and crotch from irritatin from his pull-up diapers. Would like a cream to relieve itching.  Persistent atrial fibrillation (HCC)  CKD stage 2 due to type 2 diabetes mellitus (HCC)  Chronic systolic congestive heart failure (HCC)  Depression due to stroke ([place])  Type 2 diabetes mellitus with stage 3 chronic kidney disease, without long-term current use of insulin (HCC)  Edema, unspecified type  Essential hypertension  Hypothyroidism, unspecified hypothyroidism type   Medications: Patient's Medications  New Prescriptions   No medications on  file  Previous Medications   ACETAMINOPHEN (TYLENOL) 325 MG TABLET    Take 650 mg by mouth every morning.    ALLOPURINOL (ZYLOPRIM) 100 MG TABLET    Take 50 mg by mouth daily.    AMOXICILLIN (AMOXIL) 500 MG CAPSULE    Take 500 mg by mouth. Take 4 caps prior to dental procedures   APIXABAN (ELIQUIS) 2.5 MG TABS TABLET    Take 1 tablet (2.5 mg total) by mouth 2 (two) times daily.   CARBOXYMETHYLCELLULOSE (REFRESH PLUS) 0.5 % SOLN    Place 1 drop into both eyes every evening.   CHOLECALCIFEROL (VITAMIN D) 400 UNITS CAPSULE    Take 400 Units by mouth 2 (two) times daily.    FUROSEMIDE (LASIX) 20 MG TABLET    Take 20 mg by mouth. Take 20 mg daily. If weight gain 3 lbs or more in a 24 hour or 5 lbs in 1 week. Take 40 mg daily   HYDROCORTISONE (PROCTOCORT) 1 % CREA    Apply twice daily as needed to relieve hemorrhoid discomfort   IPRATROPIUM (ATROVENT) 0.06 % NASAL SPRAY    One puff in each nostril before each meal   IPRATROPIUM-ALBUTEROL (DUONEB) 0.5-2.5 (3) MG/3ML SOLN    Take 3 mLs by nebulization 4 (four) times daily as needed.   LEVOTHYROXINE (SYNTHROID, LEVOTHROID) 125 MCG TABLET    Take 125 mcg by mouth daily before breakfast.   LORAZEPAM (ATIVAN) 0.5 MG TABLET    Take 0.25 mg by mouth  at bedtime. Take 1 tab by mouth every four hours as needed   LOSARTAN (COZAAR) 50 MG TABLET    Take 1 tablet (50 mg total) by mouth daily.   METOPROLOL (LOPRESSOR) 100 MG TABLET    Take 100 mg by mouth 2 (two) times daily.   NITROGLYCERIN (NITROSTAT) 0.4 MG SL TABLET    Place 0.4 mg under the tongue every 5 (five) minutes as needed for chest pain.    NUTRITIONAL SUPPLEMENTS (BOOST COMPACT) LIQD    Take 1 Container by mouth daily. 237 mls daily   OLOPATADINE (PATANOL) 0.1 % OPHTHALMIC SOLUTION    Place 1 drop into both eyes 2 (two) times daily.   OXYGEN    Inhale 2 L/min into the lungs as needed.   POLYETHYLENE GLYCOL (MIRALAX / GLYCOLAX) PACKET    Take 17 g by mouth daily.   POLYVINYL ALCOHOL (LIQUIFILM TEARS) 1.4  % OPHTHALMIC SOLUTION    Place 1 drop into both eyes. One drop in both eyes twice daily as needed for dry eyes   PROTEIN SUPPLEMENT (RESOURCE BENEPROTEIN) POWD    Take 1 scoop by mouth daily as needed.   SPIRONOLACTONE (ALDACTONE) 25 MG TABLET    Take 25 mg by mouth daily.   TAMSULOSIN (FLOMAX) 0.4 MG CAPS CAPSULE    Take one cap by mouth daily   TOBRAMYCIN-DEXAMETHASONE (TOBRADEX) OPHTHALMIC OINTMENT    Place 1 application into both eyes 2 (two) times daily.  Modified Medications   No medications on file  Discontinued Medications   FUROSEMIDE (LASIX) 20 MG TABLET    Take 20 mg by mouth daily. If weight gain 3 lbs or more in a 24 hour or 5 lbs in 1 week .     Review of Systems  Constitutional: Positive for unexpected weight change. Negative for fever.       Generalized weakness  HENT: Positive for congestion, hearing loss and rhinorrhea. Negative for ear discharge, ear pain and tinnitus.   Eyes:       Chronnic irritation of the right eye due to ectropion and failure of the lids to close during the day  Respiratory: Positive for cough and shortness of breath. Negative for wheezing.   Cardiovascular: Positive for leg swelling (sometimes, not apparent today. ). Negative for palpitations.       Trace edema BLE, R>L  Gastrointestinal: Negative for abdominal pain and diarrhea.       Chokes on swallowing since his CVA. Episodes of fecal incontinence. Hx of hemorrhoids.A tender Pilonidal cyst like growth at the right side of anus. Increased gas and fecal passage sometimes when his bladder moves.  Genitourinary: Positive for urgency and frequency.       Incontinent from time to time. Followed by Dr. Matilde Sprang.  Musculoskeletal: Positive for back pain. Negative for neck pain.  Skin: Negative for rash.       Itching in the groin and chronic secondary to irritation from urinary incontinence, fecal incontinence, and adult diapering.  Neurological: Positive for weakness.       Thalamic CVA  02/18/2013. Residual right side weakness, slurred speech, and depression. Recurrent CVA on 04/30/13 of the left MCA area Right facial weakness  Psychiatric/Behavioral: The patient is not nervous/anxious.     Filed Vitals:   09/24/15 1541  BP: 128/72  Pulse: 68  Temp: 97.6 F (36.4 C)  TempSrc: Oral  Height: '5\' 8"'  (1.727 m)  Weight: 159 lb (72.122 kg)  SpO2: 93%   Wt Readings from Last 3  Encounters:  09/24/15 159 lb (72.122 kg)  08/13/15 153 lb 9.6 oz (69.673 kg)  07/07/15 152 lb 9.6 oz (69.219 kg)    Body mass index is 24.18 kg/(m^2).  Physical Exam  Constitutional: He is oriented to person, place, and time. He appears well-developed and well-nourished. No distress.  HENT:  Severe hearing loss. Bilateral aides. Right facial droop.   Eyes:  Corrective lenses. Increased exposure of the right cornea due to post CVA facial droop. Irregular pupil on the left.  Neck: No JVD present. No tracheal deviation present. No thyromegaly present.  Cardiovascular: Normal rate and normal heart sounds.  Frequent extrasystoles are present.  No murmur heard. Pacemaker left upper chest  Pulmonary/Chest: Effort normal. He has no wheezes. He has rales.  dry rales posterior lung bases.   Abdominal: Soft. Bowel sounds are normal. He exhibits no distension and no mass. There is no tenderness.  Genitourinary:  .A tender Pilonidal cyst like growth at the right side of anus  Musculoskeletal: Normal range of motion. He exhibits edema. He exhibits no tenderness.  Unstable gait. Using 4 wheel walker and w/c. Weaker on the right side. Hx of the right total knee replacement. Trace edema BLE, R>L. C/o left knee pain, f/u Ortho. Motorized w/c to go further  Lymphadenopathy:    He has no cervical adenopathy.  Neurological: He is alert and oriented to person, place, and time. He displays normal reflexes. No cranial nerve deficit. He exhibits normal muscle tone. Coordination normal.  Thalamic CVA 02/18/2013.  Residual right side weakness, slurred speech, and depression. Recurrent CVA on 04/30/13 of the left MCA area Right facial weakness. Facial asymmetry is less evident today.  Skin: Skin is warm and dry. No rash noted. No erythema. No pallor.  Psychiatric: He has a normal mood and affect. His behavior is normal. Judgment and thought content normal.  Nursing note and vitals reviewed.    Labs reviewed: Lab Summary Latest Ref Rng 07/09/2015 07/02/2015 06/16/2015 06/04/2015 06/02/2015  Hemoglobin 13.5 - 17.5 g/dL (None) (None) (None) (None) 15.8  Hematocrit 41 - 53 % (None) (None) (None) (None) 46  White count - (None) (None) (None) (None) 6.9  Platelet count 150 - 399 K/L (None) (None) (None) (None) 326  Sodium 137 - 147 mmol/L 137 132(A) 136(A) 138 137  Potassium 3.4 - 5.3 mmol/L 4.3 4.7 4.8 3.9 4.0  Calcium - (None) (None) (None) (None) (None)  Phosphorus - (None) (None) (None) (None) (None)  Creatinine .6 - 1.3 mg/dL 1.1 1.5(A) 1.4(A) 1.3 1.2  AST 14 - 40 U/L (None) 27 (None) 18 17  Alk Phos 25 - 125 U/L (None) 100 (None) 94 103  Bilirubin - (None) (None) (None) (None) (None)  Glucose - 86 105 107 107 114  Cholesterol - (None) (None) (None) (None) (None)  HDL cholesterol - (None) (None) (None) (None) (None)  Triglycerides - (None) (None) (None) (None) (None)  LDL Direct - (None) (None) (None) (None) (None)  LDL Calc - (None) (None) (None) (None) (None)  Total protein - (None) (None) (None) (None) (None)  Albumin - (None) (None) (None) (None) (None)   Lab Results  Component Value Date   TSH 3.19 02/19/2015   Lab Results  Component Value Date   BUN 32* 07/09/2015   BUN 50* 07/02/2015   BUN 55* 06/16/2015   Lab Results  Component Value Date   CREATININE 1.1 07/09/2015   CREATININE 1.5* 07/02/2015   CREATININE 1.4* 06/16/2015   Lab Results  Component Value Date  HGBA1C 6.9 07/02/2015   HGBA1C 6.5* 08/29/2013   HGBA1C 6.3* 05/01/2013       Assessment/Plan 1. Persistent  atrial fibrillation (HCC) Rate controlled. Anticoagulated.                          2. CKD stage 2 due to type 2 diabetes mellitus ([place]) Unchanged/ slightly improved  3. Chronic systolic congestive heart failure (HCC) compensated  4. Depression due to stroke ([place]) improved  5. Type 2 diabetes mellitus with stage 3 chronic kidney disease, without long-term current use of insulin (HCC) controlled  6. Edema, unspecified type improved  7. Essential hypertension controlled  8. Hypothyroidism, unspecified hypothyroidism type compensated  9. Long term current use of anticoagulant therapy Using Eliquis  10. Ectropion of right eye Patient call Dr. Zigmund Daniel asked him whether a stitch would be helpful in the right eye lid.  11. Cough Persistent dry cough. My suspicion is this is related to swallowing, although he feels that it is unrelated.  12. Rhinorrhea Persistent problem. He has stopped Atrovent. It did seem to help some, but it dried his eyes.  13. Pruritic condition -Triamcinolone acetonide % cream to be applied daily to the area that itches in the crotch and groin. Patient may keep at bedside and soft plaque.  14. Dysuria Urinalysis culture pending  15. Urinary incontinence without sensory awareness Leads to the chronic irritation in the groin.  16. Fecal incontinence Patient willing to try probiotic -Align 1 daily

## 2015-10-07 ENCOUNTER — Telehealth: Payer: Self-pay | Admitting: Cardiovascular Disease

## 2015-10-07 NOTE — Telephone Encounter (Signed)
New message      The nurse from [facility] was calling concerning the down load process for the pt's device on Thursday August 4 th     1. Has your device fired? no  2. Is you device beeping? no  3. Are you experiencing draining or swelling at device site? no  4. Are you calling to see if we received your device transmission? The nurse from [facility] is calling on how to do the down load from the pt's device on August 4th  5. Have you passed out? no

## 2015-10-07 NOTE — Telephone Encounter (Signed)
LMOVM for pt nurse to return call.

## 2015-10-07 NOTE — Telephone Encounter (Signed)
Spoke w/ pt nurse. Attempted to explain to her how to send a manual transmission w/ pt home monitor. I informed her that if she needed help she could call back on Friday and I will help her send the transmission b/c I will not be here on Thursday. Pt nurse verbalized understanding.

## 2015-10-08 ENCOUNTER — Ambulatory Visit (INDEPENDENT_AMBULATORY_CARE_PROVIDER_SITE_OTHER): Payer: Medicare Other | Admitting: *Deleted

## 2015-10-08 DIAGNOSIS — I495 Sick sinus syndrome: Secondary | ICD-10-CM

## 2015-10-09 ENCOUNTER — Telehealth: Payer: Self-pay | Admitting: Internal Medicine

## 2015-10-09 NOTE — Telephone Encounter (Signed)
New message       1. Has your device fired? no  2. Is you device beeping? no 3. Are you experiencing draining or swelling at device site? no 4. Are you calling to see if we received your device transmission? [facility] nurse at [facility] is trying to get the transmission to go through  5. Have you passed out? no

## 2015-10-09 NOTE — Telephone Encounter (Signed)
Spoke w/ pt nurse and informed him that we did receive pt remote transmission. Pt nurse verbalized understanding.

## 2015-10-09 NOTE — Progress Notes (Signed)
Remote pacemaker transmission.   

## 2015-10-13 ENCOUNTER — Ambulatory Visit: Payer: Medicare Other | Admitting: Neurology

## 2015-10-14 ENCOUNTER — Encounter: Payer: Self-pay | Admitting: Cardiology

## 2015-10-15 ENCOUNTER — Encounter: Payer: Self-pay | Admitting: Neurology

## 2015-10-15 ENCOUNTER — Ambulatory Visit (INDEPENDENT_AMBULATORY_CARE_PROVIDER_SITE_OTHER): Payer: Medicare Other | Admitting: Neurology

## 2015-10-15 VITALS — BP 105/70 | HR 65 | Ht 68.0 in | Wt 161.2 lb

## 2015-10-15 DIAGNOSIS — I481 Persistent atrial fibrillation: Secondary | ICD-10-CM | POA: Diagnosis not present

## 2015-10-15 DIAGNOSIS — I6340 Cerebral infarction due to embolism of unspecified cerebral artery: Secondary | ICD-10-CM | POA: Diagnosis not present

## 2015-10-15 DIAGNOSIS — I4819 Other persistent atrial fibrillation: Secondary | ICD-10-CM

## 2015-10-15 DIAGNOSIS — I5022 Chronic systolic (congestive) heart failure: Secondary | ICD-10-CM

## 2015-10-15 DIAGNOSIS — I10 Essential (primary) hypertension: Secondary | ICD-10-CM | POA: Diagnosis not present

## 2015-10-15 DIAGNOSIS — E1151 Type 2 diabetes mellitus with diabetic peripheral angiopathy without gangrene: Secondary | ICD-10-CM | POA: Diagnosis not present

## 2015-10-15 NOTE — Progress Notes (Signed)
STROKE NEUROLOGY FOLLOW UP NOTE  NAME: Jon [name] DOB: 10-Feb-1922  REASON FOR VISIT: stroke follow up HISTORY FROM: pt and daughter and chart  Today we had the pleasure of seeing Jon [name] in follow-up at our Neurology Clinic. Pt was accompanied by daughter.   History Summary Jon [name] is a 82 y.o. right handed white male with a PMH significant for HTN, HLD, CAD s/p CABG, paroxysmal afib s/p pacemaker, s/p right CEA, recurrent ischemic strokes and AAA followed up in clinic for stroke. He had stroke in Dec 2014 with dysarthria and right face droop. Prior to the stroke, pt was on coumadin, and INR was 2.02 at that time. Nonetheless, he was switched to eliquis 2.5mg  bid for stroke prevention.   In 04/2013, pt was again admitted due to acute onset RLE weakness. He said that he lost control of that leg for approximately 30 minutes. Denies weakness of the right arm, HA, vertigo, double vision, difficulty swallowing, confusion, or visual disturbances. He talked to his daughters and was advised to come to the ED. He stated that has been taking faithfully without reported side effects. CT brain upon arrival to the ED showed findings consistent with an area of subacute to chronic lacunar infarction left MCA distribution and a subsequent MRI-DWI demonstrated: " small acute/subacute infarct left corona radiata. No mass effect or hemorrhage. 2. Three superimposed punctate acute lacunar infarcts (right caudate  nucleus, left occipital pole, left left parietal lobe). These could reflect synchronous small vessel ischemia rather than a recent embolic event". MRA brain showed intracranial atherosclerosis and fusiform enlargement of the cavernous left ICA up to 9 mm diameter as well as fusiform enlargement of the right ICA terminus, up to 6 mm diameter. Total cholesterol 117, triglycerides 79, HDL 30, LDL 71. Eliquis was increased to 5 mg twice daily. Metoprolol was increased from 50 mg twice a  day to 75 mg twice a day for better blood pressure control. Patient had resolution of his symptoms. He was discharged back to ALF.  10/01/13 follow up - the patient has been doing well. He stated that he did not have another episode of weakness, numbness, however, he still has some dysarthria and slurry speech. He continued to take eliquis 5mg  bid and no missing doses. He also complained that he is on lasix and had urinary and bowel urgency. If he did not go to bathroom quick, he will have accident.   01/06/14 follow up - he was doing the same. However, he had epistaxis in 10/2013 and as per daughter it was bad bleeding, "horrible". He went to see cardiologist Dr. Debara Pickett and was felt risk more than benefit for him to be on eliquis 5mg  bid and his eliquis was decreased to 2.5mg  bid. He was also seen recently by Man Mast, NP for his UTI with E. Coli in culture. Pt stated that he had one day of hematuria (flank blood) during the UTI. He also complained of urinary urgency after taking lasix 40mg  in the morning. Daughter requested if the lasix can be divided to 20mg  bid.  04/14/14 follow up - he was doing well, no recurrent strokelike symptoms. So far tolerating Eliquis 2.5 mg twice today. He stated that he still has some intermittent mild nosebleed, but not bad at all. His blood pressure in clinic today 112/68, he is Currently on Lasix, losartan, metoprolol for blood pressure. He lives in assisted living facility, his blood pressure was not regularly checked. Daughter reports patient lack  of energy, but patient denies any depression or depressed mood. He recently had lower back pain, which caused him walking slow.  10/13/14 follow up - Pt has been doing the same. He still lives in nursing home, walk with walker. He had a fall several weeks ago due to imbalance with quick turns. Did not get any injury. He supposed to take lasix twice a day for CHF, however, he has frequent urination after lasix, so he only takes once a  day now. He will see urologist this Friday. On flomax now. He will also see PCP in two weeks. His BP today 99/57. Did not check BP daily in NH.   Interval History During the interval time, patient has been doing well, no recurrent stroke like symptoms. On benefit is 2.5 twice a day, without side effect. Blood pressure again low at 86/49, asymptomatic, still on Lasix, Cozaar, metoprolol, spironolactone, and followed with cardiology. Patient stated that his blood pressure in ALF is always 120s in the morning before the medication. He takes most BP medication once together in the morning. I encouraged him to check BP before medication and 2 hours after and compare and space out the BP meds if needed. He has appointment with Dr. Debara Pickett on 11/03/2015.   REVIEW OF SYSTEMS: Full 14 system review of systems performed and notable only for those listed below and in HPI above, all others are negative:  Constitutional:   Cardiovascular:   Ear/Nose/Throat: running nose  Skin: itching  Eyes: eye redness  Respiratory:   Gastroitestinal:  Genitourinary: Bladder incontinence, frequent urination, urgency Hematology/Lymphatic:   Endocrine: e  Musculoskeletal: walking difficulty, joint pain  Allergy/Immunology:   Neurological: Speech difficulty Psychiatric:  Sleep: daytime sleepiness  The following represents the patient's updated allergies and side effects list: Allergies  Allergen Reactions  . Quinolones Other (See Comments)    Risk of rupture of AAA  . Caduet [Amlodipine-Atorvastatin] Other (See Comments)    Weakness   . Crestor [Rosuvastatin] Other (See Comments)    Muscle pain/weakness  . Lipitor [Atorvastatin Calcium] Other (See Comments)    Muscle pain/weakness  . Simvastatin Other (See Comments)    Muscle pain/weakness  . Lisinopril Other (See Comments)    Doesn't remember reaction    Labs since last visit of relevance include the following: Results for orders placed or performed in visit  on [id number]  Basic metabolic panel  Result Value Ref Range   Glucose 86 mg/dL   BUN 32 (A) 4 - 21 mg/dL   Creatinine 1.1 0.6 - 1.3 mg/dL   Potassium 4.3 3.4 - 5.3 mmol/L   Sodium 137 137 - 147 mmol/L    The neurologically relevant items on the patient's problem list were reviewed on today's visit.  Neurologic Examination  A problem focused neurological exam (12 or more points of the single system neurologic examination, vital signs counts as 1 point, cranial nerves count for 8 points) was performed.  Blood pressure 105/70, pulse 65, height 5\' 8"  (1.727 m), weight 161 lb 3.2 oz (73.1 kg).  General - Well nourished, well developed, in no apparent distress.  Ophthalmologic - not able to see throught. Right eye conjunctival injection.  Cardiovascular - irregularly irregular heart rate and rhythm.  Mental Status -  Level of arousal and orientation to time, place, and person were intact. Language including expression, naming, repetition, comprehension, reading was assessed and found intact. Mild dysarthria.  Cranial Nerves II - XII - II - Vision intact OU. III, IV, VI -  Extraocular movements intact. V - Facial sensation intact bilaterally. VII - Facial movement intact bilaterally. VIII - Hearing & vestibular intact bilaterally. X - Palate elevates symmetrically, mild dysarthria. XI - Chin turning & shoulder shrug intact bilaterally. XII - Tongue protrusion intact.  Motor Strength - The patient's strength was 4+/5 in all extremities and pronator drift was absent.  Bulk was normal and fasciculations were absent.   Motor Tone - Muscle tone was assessed at the neck and appendages and was normal.  Reflexes - The patient's reflexes were 1+ in all extremities and he had no pathological reflexes. No rigidity.  Sensory - Light touch, temperature/pinprick were assessed and were normal.    Coordination - The patient had normal movements in the hands with no ataxia or dysmetria.  Tremor  was absent.  Gait and Station - walk with walker, slow but steady.  Data reviewed: I personally reviewed the images and agree with the radiology interpretations.  MRI and MRA 05/01/13 1. Small acute/subacute infarct left corona radiata. No mass effect or hemorrhage. 2. Three superimposed punctate acute lacunar infarcts (right caudate nucleus, left occipital pole, left left parietal lobe). These could reflect synchronous small vessel ischemia rather than a recent embolic event. 3. Underlying chronic small vessel ischemia. 4. Intracranial atherosclerosis. Fusiform aneurysmal enlargement of the left ICA (cavernous segment) and right ICA terminus. No anterior circulation major branch occlusion. 5. Atherosclerosis in stenosis of the distal right vertebral artery which appears hemodynamically significant. Other posterior circulation atherosclerosis without stenosis. CT 04/2013 Findings consistent with an area of subacute to chronic lacunar infarction left MCA distribution. Involutional and chronic changes also appreciated. There is no evidence of acute abnormalities. Diffuse atherosclerotic disease  CTA head 02/2013 Moderate stenosis of the right cavernous carotid with heavily calcified plaque. There is fusiform dilatation of the right supra clinoid internal carotid artery compatible with atherosclerotic disease. Mild to moderate atherosclerotic stenosis of the left cavernous carotid.  Calcified plaque with mild to moderate stenosis distal vertebral artery bilaterally Diffuse atherosclerotic irregularity and plaque formation of the distal right common carotid artery and proximal internal carotid artery without significant stenosis. Plaque ulceration is present and is a source of emboli. Atherosclerotic disease in the left carotid bifurcation without hemodynamically significant stenosis. 2D ehco 02/2013 - Left ventricle: The cavity size was normal. Wall thickness   was increased in a pattern of  moderate LVH. There was   focal basal hypertrophy. Systolic function was mildly to   moderately reduced. The estimated ejection fraction was in   the range of 40% to 45%. - Aortic valve: Mild regurgitation. Valve area: 2.29cm^2   (Vmax). - Left atrium: The atrium was moderately dilated. - Right ventricle: The cavity size was mildly dilated. - Right atrium: The atrium was mildly dilated. CUS 02/2013 - Findings consistent with1- 39 percent stenosis involving the right internal carotid artery and the left internal carotid artery. - The right vertebral demonstrates atypical waveforms.   Possible proximal obstruction.   The left vertebral flow is antegrade. - ICA/CCA Ratio. right = 0.33. left= 0.84  Component     Latest Ref Rng 05/01/2013 05/14/2013 05/16/2013 05/28/2013  Cholesterol     0 - 200 mg/dL 117     Triglycerides     <150 mg/dL 79     HDL     >39 mg/dL 30 (L)     Total CHOL/HDL Ratio      3.9     VLDL     0 - 40 mg/dL 16  LDL (calc)     0 - 99 mg/dL 71     Glucose         101  BUN     4 - 21 mg/dL    25 (A)  Creatinine     0.6 - 1.3 mg/dL    1.1  Potassium     3.4 - 5.3 mmol/L    4.0  Sodium     137 - 147 mmol/L    140  Hemoglobin A1C     4.0 - 6.0 % 6.3 (H)     Mean Plasma Glucose     <117 mg/dL 134 (H)     TSH     0.41 - 5.90 uIU/mL  3.68 3.15    Component     Latest Ref Rng 08/29/2013 09/17/2013  Cholesterol     0 - 200 mg/dL    Triglycerides     <150 mg/dL    HDL     >39 mg/dL    Total CHOL/HDL Ratio         VLDL     0 - 40 mg/dL    LDL (calc)     0 - 99 mg/dL    Glucose       110  BUN     4 - 21 mg/dL  20  Creatinine     0.6 - 1.3 mg/dL  1.0  Potassium     3.4 - 5.3 mmol/L  4.4  Sodium     137 - 147 mmol/L  138  Hemoglobin A1C     4.0 - 6.0 % 6.5 (A)   Mean Plasma Glucose     <117 mg/dL    TSH     0.41 - 5.90 uIU/mL 0.62    Component     Latest Ref Rng 12/31/2013  Glucose      111  BUN     4 - 21 mg/dL 19  Creatinine     0.6  - 1.3 mg/dL 1.0  Potassium     3.4 - 5.3 mmol/L 4.4  Sodium     137 - 147 mmol/L 136 (A)  TSH     0.41 - 5.90 uIU/mL 2.10   Assessment: As you may recall, he is a 82 y.o. Caucasian male with PMH significant for HTN, HLD, CAD s/p CABG, paroxysmal afib s/p pacemaker, s/p right CEA, recurrent ischemic strokes and AAA followed up in clinic for stroke. He had first stroke in 02/2013, changed from coumadin to eliquis but on low dose. Had residue of slurry speech. In 04/2013 he had another stroke complaining of RLE weakness for 4min and resolved. MRI showed multiple punctate or small infarcts bilatearlly. He was put on eliquis 5mg  bid. However, he had epistaxis and hematuria during the interval time. Cardiologist Dr. Debara Pickett changed him back to 2.5mg  bid. So far he is tolerating 2.5 bid. He has side effect profile with Lipitor, Crestor, simvastatin. LDL last check 71, not on statin now. BP low but asymptomatic, encourage to check BP before medication and 2 hours after and compare and space out the BP meds if needed.  Plan:  - continue eliquis 2.5mg  twice a day. - check BP before medication and 2 hours after and compare and space out the BP meds if needed. Bring over BP record to Dr. Nyoka Cowden and Dr. Debara Pickett next visit. - Follow up with your primary care physician for stroke risk factor modification. Recommend maintain blood pressure goal 120-140, diabetes with hemoglobin A1c goal  below 6.5% and lipids with LDL cholesterol goal below 70 mg/dL.  - Follow-up with cardiology. - follow up as needed.  Rosalin Hawking, MD PhD [facility] [address] [place], [place] [address] [phone]  No orders of the defined types were placed in this encounter.  No orders of the defined types were placed in this encounter.   Patient Instructions  - continue eliquis 2.5mg  twice a day. - Check blood pressure at the ALF before breakfast and around 11-12 to see if there is any low BP. Bring over  BP record to Dr. Nyoka Cowden and Dr. Debara Pickett next visit. - Follow up with your primary care physician for stroke risk factor modification. Recommend maintain blood pressure goal 120-140, diabetes with hemoglobin A1c goal below 6.5% and lipids with LDL cholesterol goal below 70 mg/dL.  - Follow-up with cardiology. - follow up as needed.

## 2015-10-15 NOTE — Patient Instructions (Addendum)
-   continue eliquis 2.5mg  twice a day. - Check blood pressure at the ALF before breakfast and around 11-12 to see if there is any low BP. Bring over BP record to Dr. Nyoka Cowden and Dr. Debara Pickett next visit. - Follow up with your primary care physician for stroke risk factor modification. Recommend maintain blood pressure goal 120-140, diabetes with hemoglobin A1c goal below 6.5% and lipids with LDL cholesterol goal below 70 mg/dL.  - Follow-up with cardiology. - follow up as needed.

## 2015-10-28 ENCOUNTER — Encounter: Payer: Self-pay | Admitting: Nurse Practitioner

## 2015-10-28 ENCOUNTER — Non-Acute Institutional Stay: Payer: Medicare Other | Admitting: Nurse Practitioner

## 2015-10-28 DIAGNOSIS — F4323 Adjustment disorder with mixed anxiety and depressed mood: Secondary | ICD-10-CM | POA: Diagnosis not present

## 2015-10-28 DIAGNOSIS — I5022 Chronic systolic (congestive) heart failure: Secondary | ICD-10-CM | POA: Diagnosis not present

## 2015-10-28 DIAGNOSIS — I10 Essential (primary) hypertension: Secondary | ICD-10-CM

## 2015-10-28 DIAGNOSIS — E039 Hypothyroidism, unspecified: Secondary | ICD-10-CM | POA: Diagnosis not present

## 2015-10-28 DIAGNOSIS — K219 Gastro-esophageal reflux disease without esophagitis: Secondary | ICD-10-CM

## 2015-10-28 DIAGNOSIS — E1151 Type 2 diabetes mellitus with diabetic peripheral angiopathy without gangrene: Secondary | ICD-10-CM | POA: Diagnosis not present

## 2015-10-28 DIAGNOSIS — I480 Paroxysmal atrial fibrillation: Secondary | ICD-10-CM

## 2015-10-28 DIAGNOSIS — K5900 Constipation, unspecified: Secondary | ICD-10-CM | POA: Diagnosis not present

## 2015-10-28 DIAGNOSIS — M1000 Idiopathic gout, unspecified site: Secondary | ICD-10-CM | POA: Diagnosis not present

## 2015-10-28 NOTE — Assessment & Plan Note (Signed)
Controlled, continue Metoprolol 100 mg twice a day, Losartan 50mg , Furosemide 40mg  qd, Spironolactone 25mg 

## 2015-10-28 NOTE — Assessment & Plan Note (Signed)
stable °

## 2015-10-28 NOTE — Assessment & Plan Note (Signed)
Diet controlled.  

## 2015-10-28 NOTE — Assessment & Plan Note (Signed)
Rate controlled, continue Metoprolol 100mg  bid. Eliquis for VTE risk reduction.

## 2015-10-28 NOTE — Assessment & Plan Note (Signed)
09/16/14 TSH 2.965 01/15/15 7.202 02/19/15 TSH 3.194 06/16/15 TSH 1.86 Continue Levothyroxine 1109mcg

## 2015-10-28 NOTE — Assessment & Plan Note (Signed)
Stable, Continue Lorazepam 0.25mg  qhs

## 2015-10-28 NOTE — Assessment & Plan Note (Signed)
compensated clinically, continue Furosemide 40mg  qd, prn Furosemide. Spironolactone 25mg .

## 2015-10-28 NOTE — Assessment & Plan Note (Signed)
Stable, continue Benefiber and daily MiraLax

## 2015-10-28 NOTE — Assessment & Plan Note (Signed)
No flare ups. continue Allopurinol 50mg daily.  

## 2015-10-28 NOTE — Progress Notes (Signed)
Location:   [facility] Room Number: [place] of Service:  SNF (31) Provider:  Shelvy Perazzo, Manxie  NP  Jeanmarie Hubert, MD  Patient Care Team: [name], MD as PCP - General (Internal Medicine) [facility] Pixie Casino, MD as Consulting Physician (Cardiology) Ardis Hughs, MD as Attending Physician (Urology)  Extended Emergency Contact Information Primary Emergency Contact: [name] of [place] Phone: [phone] Relation: Daughter Secondary Emergency Contact: Eyvonne Mechanic [place] of [place] Phone: [phone] Mobile Phone: [phone] Relation: Daughter  Code Status:  Full Code Goals of care: Advanced Directive information Advanced Directives 10/28/2015  Does patient have an advance directive? Yes  Type of Paramedic of [place];Living will  Does patient want to make changes to advanced directive? No - Patient declined  Copy of advanced directive(s) in chart? Yes  Pre-existing out of facility DNR order (yellow form or pink MOST form) -     Chief Complaint  Patient presents with  . Medical Management of Chronic Issues    HPI:  Pt is a 82 y.o. male seen today for medical management of chronic diseases.    Hx of hypothyroidism, takingLevothyroxine 137mg, TSH 1.86 06/16/15. CVA, CHF, HTN, anxiety, urinary frequency. CHF: less SOB, dry rales back of lungs, on Furosemide '40mg'$ , Spironolactone '25mg'$  daily. BPH: no urinary retention, but dribbling, taking Tamsulosin 0.'4mg'$  daily. Gout: no flare ups, taking Allopurinol '50mg'$ . Afib/late effect of CVA: heart rate is in control, left facial weakness, ambulates with walker, taking Eliquis   Past Medical History:  Diagnosis Date  . Abnormal PFTs 10/30/2012   Followed in Pulmonary clinic/ [facility]/ [place]  - PFT's 10/22/12 VC  55% no obst, DLCO 50%  - PFT's 12/20/2012 VC 83% and no obst, dLCO 55%  -11/08/2012  Walked RA x  3 laps @ 185 ft each stopped due to  End of study, not desat -11/08/12 esr 10    . Abnormality of gait 04/18/2013  . Acute on chronic systolic congestive heart failure, NYHA class 2 ([facility] 03/16/2006   BNP 376.9 05/28/13   . Adjustment disorder with mixed anxiety and depressed mood 04/18/2013   Post CVA depression and anxiety   . Allergy   . Anxiety   . Aortic aneurysm of unspecified site without mention of rupture 10/27/2011  . Arthritis   . Atherosclerosis of renal artery ([facility] 10/18/2006  . Atrial fibrillation ([facility] 03/04/2011    He was on coumadin until his stroke last December and was switched to apixaban 2.'5mg'$  daily which he has been taking faithfully without reported side effects.    . Balance disorder 07/31/2014  . Basal cell carcinoma of skin of other and unspecified parts of face 12/24/2009  . Blood transfusion without reported diagnosis   . Cardiac pacemaker in situ- MDT 10/11 03/18/2010   Medtronic revo implant in October 2011. Severe sinus bradycardia in the 30s, but not truly pacemaker dependent   . Cataract   . CKD stage 2 due to type 2 diabetes mellitus ([facility] 07/03/2014  . Coronary artery disease   . CVA - Rt brain stroke 12/14 02/18/2013   02/19/13 angiography CT head:  Diffuse atherosclerotic irregularity and plaque formation of the distal right common carotid artery and proximal internal carotid artery without significant stenosis. Plaque ulceration is present and is a source of emboli. A small right thalamic CVA    . DM (diabetes mellitus), type 2 with renal complications ([facility] 45/28/4132 .  Dyslipidemia 11/03/2004  . Fall 03/04/2011  . Fatigue 04/18/2013  . Gout, unspecified 11/03/2004  . Heart murmur   . History of abdominal aortic aneurysm   . History of Doppler ultrasound 05/22/2012   LEAs; R anterior tibial artery appeared occluded; L posterior tibial shows short segment of occlusive ds  . History of Doppler ultrasound 05/22/2012   Abdominal Aortic Doppler; slight increase in  fusiform aneurysm   . History of echocardiogram 12/2008   EF >55%; mild concentric LVH; mild MR; mild-mod TR; mild AV regurg;   . History of nuclear stress test 12/2010   lexiscan; low risk; compared to prior study, perfusion improved  . HTN (hypertension) 03/04/2011  . Hyperlipidemia   . Hypertension   . Hypertrophy of prostate with urinary obstruction and other lower urinary tract symptoms (LUTS) 04/28/2004  . Hyponatremia 03/04/2011  . Hypothyroidism 03/04/2011  . Impotence of organic origin 10/03/2000  . Internal hemorrhoids without mention of complication 14/78/2956  . Long term (current) use of anticoagulants 03/17/2011  . Major depressive disorder, single episode, unspecified ([place]) 03/05/2009  . Memory change 01/24/2013  . Muscle weakness (generalized) 03/10/2011  . Nocturia 10/27/2011  . Obstructive sleep apnea (adult) (pediatric) 07/23/2008  . Open wound of knee, leg (except thigh), and ankle, complicated 04/20/863  . Osteoarthritis of both knees 07/31/2014  . Osteoarthrosis, unspecified whether generalized or localized, unspecified site 02/05/2003  . PAF (paroxysmal atrial fibrillation) (HCC)    coumadin  . Pain in joint, lower leg 08/04/2011  . Pruritic condition 01/24/2013  . Quadriceps weakness 07/31/2014  . Right bundle branch block 04/19/2006  . 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 Spinal stenosis in cervical region 12/15/2004  . SSS (sick sinus syndrome) ([place]) 04/08/2014  . Stroke ([place])   . Thyroid disease   . TIA (transient ischemic attack) 04/30/2013  . Type II or unspecified type diabetes mellitus without mention of complication, not stated as uncontrolled 11/24/2004  . Unspecified vitamin D deficiency 10/18/2006  . 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 Increased exposure of the right cornea.    Past Surgical History:  Procedure Laterality Date  . CORONARY ANGIOPLASTY WITH STENT PLACEMENT  2008    stent to SVG to OM  . CORONARY ARTERY BYPASS GRAFT  1989  . EYE SURGERY    . HERNIA REPAIR    . INSERT / REPLACE / REMOVE PACEMAKER    . JOINT REPLACEMENT    . PACEMAKER INSERTION  2011    Allergies  Allergen Reactions  . Quinolones Other (See Comments)    Risk of rupture of AAA  . Caduet [Amlodipine-Atorvastatin] Other (See Comments)    Weakness   . Crestor [Rosuvastatin] Other (See Comments)    Muscle pain/weakness  . Lipitor [Atorvastatin Calcium] Other (See Comments)    Muscle pain/weakness  . Simvastatin Other (See Comments)    Muscle pain/weakness  . Lisinopril Other (See Comments)    Doesn't remember reaction      Medication List       Accurate as of 10/28/15  5:07 PM. Always use your most recent med list.          acetaminophen 325 MG tablet Commonly known as:  TYLENOL Take 325 mg by mouth every morning.   allopurinol 100 MG tablet Commonly known as:  ZYLOPRIM Take 50 mg by mouth daily.   amoxicillin 500 MG capsule Commonly known as:  AMOXIL Take 500 mg  by mouth. Take 4 caps prior to dental procedures   apixaban 2.5 MG Tabs tablet Commonly known as:  ELIQUIS Take 1 tablet (2.5 mg total) by mouth 2 (two) times daily.   BOOST COMPACT Liqd Take 1 Container by mouth daily. 237 mls daily   carboxymethylcellulose 0.5 % Soln Commonly known as:  REFRESH PLUS Place 1 drop into both eyes every evening.   furosemide 40 MG tablet Commonly known as:  LASIX Take 40 mg by mouth daily.   hydrocortisone 1 % Crea Commonly known as:  PROCTOCORT Apply twice daily as needed to relieve hemorrhoid discomfort   ipratropium-albuterol 0.5-2.5 (3) MG/3ML Soln Commonly known as:  DUONEB Take 3 mLs by nebulization 4 (four) times daily as needed.   levothyroxine 125 MCG tablet Commonly known as:  SYNTHROID, LEVOTHROID Take 125 mcg by mouth daily before breakfast.   LORazepam 0.5 MG tablet Commonly known as:  ATIVAN Take 0.25 mg by mouth at bedtime. Take 1 tab by  mouth every four hours as needed   losartan 50 MG tablet Commonly known as:  COZAAR Take 1 tablet (50 mg total) by mouth daily.   metoprolol 100 MG tablet Commonly known as:  LOPRESSOR Take 100 mg by mouth 2 (two) times daily.   nitroGLYCERIN 0.4 MG SL tablet Commonly known as:  NITROSTAT Place 0.4 mg under the tongue every 5 (five) minutes as needed for chest pain.   olopatadine 0.1 % ophthalmic solution Commonly known as:  PATANOL Place 1 drop into both eyes 2 (two) times daily.   OXYGEN Inhale 2 L/min into the lungs as needed.   polyethylene glycol packet Commonly known as:  MIRALAX / GLYCOLAX Take 17 g by mouth daily.   polyvinyl alcohol 1.4 % ophthalmic solution Commonly known as:  LIQUIFILM TEARS Place 1 drop into both eyes. One drop in both eyes twice daily as needed for dry eyes   protein supplement Powd Take 1 scoop by mouth daily as needed.   spironolactone 25 MG tablet Commonly known as:  ALDACTONE Take 25 mg by mouth daily.   tamsulosin 0.4 MG Caps capsule Commonly known as:  FLOMAX Take one cap by mouth daily   tobramycin-dexamethasone ophthalmic ointment Commonly known as:  TOBRADEX Place 1 application into both eyes 2 (two) times daily.   triamcinolone cream 0.1 % Commonly known as:  KENALOG Apply sparingly up to twice daily to rash until it is resolved   Vitamin D 400 units capsule Take 400 Units by mouth 2 (two) times daily.       Review of Systems  Constitutional: Positive for unexpected weight change. Negative for fever.       Generalized weakness  HENT: Positive for congestion, hearing loss and rhinorrhea. Negative for ear discharge, ear pain and tinnitus.   Eyes:       Chronnic irritation of the right eye due to ectropion and failure of the lids to close during the day  Respiratory: Positive for cough and shortness of breath. Negative for wheezing.   Cardiovascular: Positive for leg swelling (sometimes, not apparent today. ). Negative  for palpitations.       Trace edema BLE, R>L  Gastrointestinal: Negative for abdominal pain and diarrhea.       Chokes on swallowing since his CVA. Episodes of fecal incontinence. Hx of hemorrhoids.A tender Pilonidal cyst like growth at the right side of anus. Increased gas and fecal passage sometimes when his bladder moves.  Genitourinary: Positive for frequency and urgency.  Incontinent from time to time. Followed by Dr. Matilde Sprang.  Musculoskeletal: Positive for back pain. Negative for neck pain.  Skin: Negative for rash.       Itching in the groin and chronic secondary to irritation from urinary incontinence, fecal incontinence, and adult diapering.  Neurological: Positive for weakness.       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 Recurrent CVA on 04/30/13 of the left MCA area Right facial weakness  Psychiatric/Behavioral: The patient is not nervous/anxious.     Immunization History  Administered Date(s) Administered  . Influenza Whole 12/06/2011, 12/19/2012  . Influenza-Unspecified 01/09/2014, 11/25/2014  . PPD Test 05/03/2013  . Pneumococcal Polysaccharide-23 03/07/1986  . Td 04/20/2005   Pertinent  Health Maintenance Due  Topic Date Due  . OPHTHALMOLOGY EXAM  01/12/1932  . PNA vac Low Risk Adult (1 of 2 - PCV13) 01/12/1989  . FOOT EXAM  07/31/2015  . INFLUENZA VACCINE  10/06/2015  . HEMOGLOBIN A1C  01/01/2016   Fall Risk  10/15/2015 06/11/2015 10/23/2014 07/07/2014 02/17/2014  Falls in the past year? No Yes Yes Yes Exclusion - non ambulatory  Number falls in past yr: - '1 1 1 '$ -  Injury with Fall? - Yes - No -  Risk for fall due to : - - - History of fall(s);Impaired mobility;Impaired balance/gait -  Follow up - - - Falls evaluation completed;Falls prevention discussed -   Functional Status Survey:    Vitals:   10/28/15 1119  BP: 120/68  Pulse: 70  Resp: 20  Temp: 97.1 F (36.2 C)  Weight: 155 lb 3.2 oz (70.4 kg)    Height: '5\' 8"'$  (1.727 m)   Body mass index is 23.6 kg/m. Physical Exam  Constitutional: He is oriented to person, place, and time. He appears well-developed and well-nourished. No distress.  HENT:  Severe hearing loss. Bilateral aides. Right facial droop.   Eyes:  Corrective lenses. Increased exposure of the right cornea due to post CVA facial droop. Irregular pupil on the left.  Neck: No JVD present. No tracheal deviation present. No thyromegaly present.  Cardiovascular: Normal rate and normal heart sounds.  Frequent extrasystoles are present.  No murmur heard. Pacemaker left upper chest  Pulmonary/Chest: Effort normal. He has no wheezes. He has rales.  dry rales posterior lung bases.   Abdominal: Soft. Bowel sounds are normal. He exhibits no distension and no mass. There is no tenderness.  Genitourinary:  Genitourinary Comments: .A tender Pilonidal cyst like growth at the right side of anus  Musculoskeletal: Normal range of motion. He exhibits edema. He exhibits no tenderness.  Unstable gait. Using 4 wheel walker and w/c. Weaker on the right side. Hx of the right total knee replacement. Trace edema BLE, R>L. C/o left knee pain, f/u Ortho. Motorized w/c to go further  Lymphadenopathy:    He has no cervical adenopathy.  Neurological: He is alert and oriented to person, place, and time. He displays normal reflexes. No cranial nerve deficit. He exhibits normal muscle tone. Coordination normal.  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 Recurrent CVA on 04/30/13 of the left MCA area Right facial weakness. Facial asymmetry is less evident today.  Skin: Skin is warm and dry. No rash noted. No erythema. No pallor.  There is irritated SK about 1x1cm right mid back next to the spine.   Psychiatric: He has a normal mood and affect. His behavior is normal. Judgment and thought content normal.  Nursing note  and vitals reviewed.   Labs  reviewed:  Recent Labs  06/16/15 07/02/15 07/09/15  NA 136* 132* 137  K 4.8 4.7 4.3  BUN 55* 50* 32*  CREATININE 1.4* 1.5* 1.1    Recent Labs  06/02/15 06/04/15 07/02/15  AST '17 18 27  '$ ALT '11 11 25  '$ ALKPHOS 103 94 100    Recent Labs  11/25/14 01/16/15 06/02/15  WBC 7.2 7.1 6.9  HGB 15.0 16.0 15.8  HCT 44 48 46  PLT 200 180 326   Lab Results  Component Value Date   TSH 3.19 02/19/2015   Lab Results  Component Value Date   HGBA1C 6.9 07/02/2015   Lab Results  Component Value Date   CHOL 117 05/01/2013   HDL 30 (L) 05/01/2013   LDLCALC 71 05/01/2013   TRIG 79 05/01/2013   CHOLHDL 3.9 05/01/2013    Significant Diagnostic Results in last 30 days:  No results found.  Assessment/Plan There are no diagnoses linked to this encounter.Atrial fibrillation Rate controlled, continue Metoprolol '100mg'$  bid. Eliquis for VTE risk reduction.   HTN (hypertension) Controlled, continue Metoprolol 100 mg twice a day, Losartan '50mg'$ , Furosemide '40mg'$  qd, Spironolactone '25mg'$     Diabetes mellitus, type 2, with circulatory disorder ([place]) Diet controlled.    Chronic systolic congestive heart failure ([place]) compensated clinically, continue Furosemide '40mg'$  qd, prn Furosemide. Spironolactone '25mg'$ .   GERD (gastroesophageal reflux disease) stable  Constipation Stable, continue Benefiber and daily MiraLax  Hypothyroidism 09/16/14 TSH 2.965 01/15/15 7.202 02/19/15 TSH 3.194 06/16/15 TSH 1.86 Continue Levothyroxine 129mg   Gout No flare ups. continue Allopurinol '50mg'$  daily.   Adjustment disorder with mixed anxiety and depressed mood Stable, Continue Lorazepam 0.'25mg'$  qhs     Family/ staff Communication: continue AL for care assistance.   Labs/tests ordered:  none

## 2015-11-02 LAB — CUP PACEART REMOTE DEVICE CHECK
Battery Voltage: 2.93 V
Brady Statistic RV Percent Paced: 85.7 %
Implantable Lead Implant Date: 20111021
Implantable Lead Location: 753859
Implantable Lead Location: 753860
Implantable Lead Model: 5086
Lead Channel Sensing Intrinsic Amplitude: 0.572 mV
Lead Channel Setting Pacing Amplitude: 2.5 V
Lead Channel Setting Pacing Pulse Width: 0.4 ms
Lead Channel Setting Sensing Sensitivity: 0.9 mV
MDC IDC LEAD IMPLANT DT: 20111021
MDC IDC LEAD MODEL: 5086
MDC IDC MSMT LEADCHNL RA IMPEDANCE VALUE: 408 Ohm
MDC IDC MSMT LEADCHNL RV IMPEDANCE VALUE: 432 Ohm
MDC IDC SESS DTM: 20170804175522

## 2015-11-03 ENCOUNTER — Ambulatory Visit (INDEPENDENT_AMBULATORY_CARE_PROVIDER_SITE_OTHER): Payer: Medicare Other | Admitting: Internal Medicine

## 2015-11-03 ENCOUNTER — Encounter: Payer: Self-pay | Admitting: Internal Medicine

## 2015-11-03 VITALS — BP 102/58 | HR 62 | Ht 68.0 in | Wt 158.8 lb

## 2015-11-03 DIAGNOSIS — I714 Abdominal aortic aneurysm, without rupture, unspecified: Secondary | ICD-10-CM

## 2015-11-03 DIAGNOSIS — I6340 Cerebral infarction due to embolism of unspecified cerebral artery: Secondary | ICD-10-CM | POA: Diagnosis not present

## 2015-11-03 DIAGNOSIS — Z950 Presence of cardiac pacemaker: Secondary | ICD-10-CM

## 2015-11-03 DIAGNOSIS — I4819 Other persistent atrial fibrillation: Secondary | ICD-10-CM

## 2015-11-03 DIAGNOSIS — I2510 Atherosclerotic heart disease of native coronary artery without angina pectoris: Secondary | ICD-10-CM | POA: Insufficient documentation

## 2015-11-03 DIAGNOSIS — I952 Hypotension due to drugs: Secondary | ICD-10-CM | POA: Insufficient documentation

## 2015-11-03 DIAGNOSIS — I481 Persistent atrial fibrillation: Secondary | ICD-10-CM | POA: Diagnosis not present

## 2015-11-03 MED ORDER — LOSARTAN POTASSIUM 25 MG PO TABS: 25.0000 mg | ORAL_TABLET | Freq: Every day | ORAL | 3 refills | Status: AC

## 2015-11-03 NOTE — Progress Notes (Signed)
OFFICE NOTE  Chief Complaint:  Feels well  Primary Care Physician: Jeanmarie Hubert, MD  HPI:  Jon [name]  is a 82 yo male formerly followed by Dr. Rex Kras and recently seen by Cecilie Kicks, NP, for left lower extremity edema with discoloration of his toes, was beginning to be uncomfortable for him to walk. We did venous Dopplers. He was quite concerned about arterial blood supply. His mother ended up with bilateral amputations. Venous Dopplers showed a ruptured baker cyst, no DVT. He was instructed on this and since that time his symptoms have resolved completely. He is on Coumadin for paroxysmal A-fib which led to the discoloration in his toes. Additionally, he has a history of abdominal aortic aneurysm and because he was concerned about his arterial disease, if he had it, we did Dopplers.  He is here for the results of the Dopplers. Bilateral ABIs were 1.0, though he does have appearance of an occluded right anterior tibial artery and a left posterior tib demonstrated a short segment of occlusive disease with reconstitution at the ankle. His pulses have been 2+. He has no claudication symptoms. Additionally, the duplex of his abdominal aorta was slightly, minimally changed from his last one. Previously it was 3.95 x 3.57, now he is 3.7 x 4.0, essentially the same, very slight increase. He has no abdominal pain and no other complaints. He has really no complaints today, feels quite well. He is not aware of any tachycardias or palpitations. He has a Medtronic pacemaker which was interrogated today. This demonstrates a battery voltage of 3.0V.  His a-fib burden is 1.6% and he is on amiodarone.  Other history includes bypass grafting in 1989; vein graft was stented in 2008. Last stress test was 2012, low risk study. EF was 49%. Pacemaker was placed for paroxysmal A-fib and bradycardia, sick sinus syndrome in 2011. He is also on warfarin.   [name] underwent PFTs on 10/22/2012. This showed a  moderately severe restrictive limitation as well as moderately reduced diffusion capacity. Based on these findings I recommended discontinuing his amiodarone and he also stopped his pravastatin. Since that time he's noted that he feels quite a bit better including some minor improvement in his knee pain. He still feels like his left knee is bothering him and he is status post right TKR. 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 I did refer him to the [facility], but has not been contacted for appointment so far.  Since his last followup, Mr. Blanchard was seen by Dr. Sallyanne Kuster for a device check in his device appeared to be working properly. He was having atrial fibrillation, but a little burden. He continued on warfarin which was generally therapeutic. Unfortunately, in December he had a thalamic stroke which resulted in some right facial droop and hemiparesis. That is improving , but slowly. He was then changed to Eliquis for anticoagulation. He was also started on Zetia, as he has a history of statin intolerance in the past.  An echo performed in the hospital demonstrated an EF of 40-45%, which is mildly reduced from previous studies. In addition, he has had a small amount of weight gain and lower extremity swelling which is noted over the past several weeks while in recovery.  Unfortunately, he suffered another stroke last month around the time that we plan to admit him for tikosyn induction. He is still trying to recover from that.   [name] appears euvolemic today.  He does have a small amount of lower extremity swelling. His weight has been stable. He is reporting some epistaxis. He is concerned about the dose of his Eliquis. He is also reporting some urinary incontinence which is new. There is some flank pain and there is some possible concern for urinary tract infection however he is awaiting the results of a urinalysis.  [name] was seen in the office today as an add-on for  worsening swelling and shortness of breath. I reviewed notes from [facility] which indicate in January because of excessive urination he requested to come off of his diuretics. He was taken off the diuretics but quickly developed swelling and worsening shortness of breath. A BNP was obtained which was greater than 300. He was restarted on his diuretics and had some improvement in his shortness of breath but still has persistent symptoms. He's also developed asymmetric right lower extremity edema. He underwent an in office Doppler ultrasound that was negative for DVT. Those results are communicated in the office notes, but no formal radiology report is available. It would be unlikely for him to have DVT given the fact that he's on Eliquis. He still has persistent right lower extremity edema. He reports shortness of breath, fatigue and difficulty speaking. He denies any chest pain.  He is also overdue for pacemaker check and that was performed in the office today under my supervision.  [name] returns today in the office. He reports an improvement in his shortness of breath with an increased dose of Lasix. An echocardiogram was performed which shows preserved systolic function of the left ventricle however there is significant right heart dysfunction and severe dilatation of the right ventricle. This is likely the cause of his shortness of breath. He seems to respond and diaphoretic. He reports his fatigue is improving and is currently working with physical therapy.  I saw [name] back today in the office. When I last saw him he had had some leg swelling and I recommended increasing his Lasix to 40 mg twice daily. Unfortunately due to problems with incontinence he did not make those changes. Over the past 3 months there is been about a 13 pound weight gain. He has significant leg swelling. This is likely due to some right heart failure symptoms which were suspected at his last office visit. He does  get a little more short of breath with exertion as well.  [name] returns again today for close follow-up. He is now been taking Lasix 40 mg twice daily. His weight is actually down 3-4 pounds since his last office visit. He's been urinating quite a bit more. He did see Dr. Louis Meckel for a urology evaluation. His medicines were adjusted and although he was having some urinary retention now is having problems with incontinence. He appears to have had some improvement in his swelling and overall some mild improvement in shortness of breath however the other night was noted to be short of breath and hypoxic. He was placed on oxygen overnight. Is not clear whether he will need nocturnal oxygen however he does have a remote history of sleep apnea and has not used CPAP in some time. Labs in early October indicated elevated BNP of 350, but this is not been reassessed since diuresis.  Mr. Brickle reports some improvement in his symptoms. He's more energetic today to fact has had recent treatment of his knees with injections and therapy at [facility]. He says this has helped significantly. He is walking  and does appear somewhat brighter today. Generally his energy level is pretty good although he naps fairly regularly. His weight is been stable. He denies any worsening shortness of breath or chest pain. He just did a remote check and will be due to see Dr. Loletha Grayer back in the office in a few months. He is overdue for abdominal ultrasound as he had a small aortic aneurysm which was seen in 2014. He's also had carotid Dopplers which showed mild carotid disease.  11/03/2015  Mr. Gnau returns today generally without any complaints. He looks like he's gained a few pounds but does not appear volume overloaded. He denies any chest pain or worsening shortness of breath. Fortunately he has had no falls. He has been struggling with somewhat lower blood pressure. He saw Dr. Sallyanne Kuster back in May and his blood pressure medication was  decreased. He still has some problems with hypotension. He seems to be asymptomatic with this however there is concern about his risk for falls. His for his blood pressure medication he is on losartan and metoprolol. 8 check of his pacemaker shows a persistent atrial fibrillation. It is normally functioning. He recently had normalization of his LVEF. He denies any bleeding problems on Eliquis which she is taking for recent thalamic stroke in the setting of A. fib. He had Dopplers this year of his abdominal aortic aneurysm which shows stable findings.  PMHx:  Past Medical History:  Diagnosis Date  . Abnormal PFTs 10/30/2012   Followed in Pulmonary clinic/ [facility]/ [place]  - PFT's 10/22/12 VC  55% no obst, DLCO 50%  - PFT's 12/20/2012 VC 83% and no obst, dLCO 55%  -11/08/2012  Walked RA x 3 laps @ 185 ft each stopped due to  End of study, not desat -11/08/12 esr 10    . Abnormality of gait 04/18/2013  . Acute on chronic systolic congestive heart failure, NYHA class 2 ([place]) 03/16/2006   BNP 376.9 05/28/13   . Adjustment disorder with mixed anxiety and depressed mood 04/18/2013   Post CVA depression and anxiety   . Allergy   . Anxiety   . Aortic aneurysm of unspecified site without mention of rupture 10/27/2011  . Arthritis   . Atherosclerosis of renal artery ([place]) 10/18/2006  . Atrial fibrillation ([place]) 03/04/2011    He was on coumadin until his stroke last December and was switched to apixaban 2.53m daily which he has been taking faithfully without reported side effects.    . Balance disorder 07/31/2014  . Basal cell carcinoma of skin of other and unspecified parts of face 12/24/2009  . Blood transfusion without reported diagnosis   . Cardiac pacemaker in situ- MDT 10/11 03/18/2010   Medtronic revo implant in October 2011. Severe sinus bradycardia in the 30s, but not truly pacemaker dependent   . Cataract   . CKD stage 2 due to type 2 diabetes mellitus ([place] 07/03/2014  . Coronary artery disease     . CVA - Rt brain stroke 12/14 02/18/2013   02/19/13 angiography CT head:  Diffuse atherosclerotic irregularity and plaque formation of the distal right common carotid artery and proximal internal carotid artery without significant stenosis. Plaque ulceration is present and is a source of emboli. A small right thalamic CVA    . DM (diabetes mellitus), type 2 with renal complications ([place] 44/76/5465 . Dyslipidemia 11/03/2004  . Fall 03/04/2011  . Fatigue 04/18/2013  . Gout, unspecified 11/03/2004  . Heart murmur   . History of abdominal aortic  aneurysm   . History of Doppler ultrasound 05/22/2012   LEAs; R anterior tibial artery appeared occluded; L posterior tibial shows short segment of occlusive ds  . History of Doppler ultrasound 05/22/2012   Abdominal Aortic Doppler; slight increase in fusiform aneurysm   . History of echocardiogram 12/2008   EF >55%; mild concentric LVH; mild MR; mild-mod TR; mild AV regurg;   . History of nuclear stress test 12/2010   lexiscan; low risk; compared to prior study, perfusion improved  . HTN (hypertension) 03/04/2011  . Hyperlipidemia   . Hypertension   . Hypertrophy of prostate with urinary obstruction and other lower urinary tract symptoms (LUTS) 04/28/2004  . Hyponatremia 03/04/2011  . Hypothyroidism 03/04/2011  . Impotence of organic origin 10/03/2000  . Internal hemorrhoids without mention of complication 01/75/1025  . Long term (current) use of anticoagulants 03/17/2011  . Major depressive disorder, single episode, unspecified ([place]) 03/05/2009  . Memory change 01/24/2013  . Muscle weakness (generalized) 03/10/2011  . Nocturia 10/27/2011  . Obstructive sleep apnea (adult) (pediatric) 07/23/2008  . Open wound of knee, leg (except thigh), and ankle, complicated 8/52/7782  . Osteoarthritis of both knees 07/31/2014  . Osteoarthrosis, unspecified whether generalized or localized, unspecified site 02/05/2003  . PAF (paroxysmal atrial fibrillation) (HCC)     coumadin  . Pain in joint, lower leg 08/04/2011  . Pruritic condition 01/24/2013  . Quadriceps weakness 07/31/2014  . Right bundle branch block 04/19/2006  . 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 Spinal stenosis in cervical region 12/15/2004  . SSS (sick sinus syndrome) ([place]) 04/08/2014  . Stroke ([place])   . Thyroid disease   . TIA (transient ischemic attack) 04/30/2013  . Type II or unspecified type diabetes mellitus without mention of complication, not stated as uncontrolled 11/24/2004  . Unspecified vitamin D deficiency 10/18/2006  . 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 Increased exposure of the right cornea.     Past Surgical History:  Procedure Laterality Date  . CORONARY ANGIOPLASTY WITH STENT PLACEMENT  2008   stent to SVG to OM  . CORONARY ARTERY BYPASS GRAFT  1989  . EYE SURGERY    . HERNIA REPAIR    . INSERT / REPLACE / REMOVE PACEMAKER    . JOINT REPLACEMENT    . PACEMAKER INSERTION  2011    FAMHx:  Family History  Problem Relation Age of Onset  . Diabetes Father   . Other Mother     PAD with amputations    SOCHx:   reports that he quit smoking about 48 years ago. His smoking use included Cigarettes. He has a 11.25 pack-year smoking history. He has never used smokeless tobacco. He reports that he does not drink alcohol or use drugs.  ALLERGIES:  Allergies  Allergen Reactions  . Quinolones Other (See Comments)    Risk of rupture of AAA  . Caduet [Amlodipine-Atorvastatin] Other (See Comments)    Weakness   . Crestor [Rosuvastatin] Other (See Comments)    Muscle pain/weakness  . Lipitor [Atorvastatin Calcium] Other (See Comments)    Muscle pain/weakness  . Simvastatin Other (See Comments)    Muscle pain/weakness  . Lisinopril Other (See Comments)    Doesn't remember reaction    ROS: Pertinent items noted in HPI and remainder of comprehensive ROS otherwise negative.  HOME  MEDS: Current Outpatient Prescriptions  Medication Sig Dispense Refill  . acetaminophen (TYLENOL) 325 MG tablet Take  325 mg by mouth every morning.     Marland Kitchen allopurinol (ZYLOPRIM) 100 MG tablet Take 50 mg by mouth daily.     Marland Kitchen amoxicillin (AMOXIL) 500 MG capsule Take 500 mg by mouth. Take 4 caps prior to dental procedures    . apixaban (ELIQUIS) 2.5 MG TABS tablet Take 1 tablet (2.5 mg total) by mouth 2 (two) times daily. 60 tablet 11  . aspirin EC 81 MG tablet Take 81 mg by mouth daily.    . carboxymethylcellulose (REFRESH PLUS) 0.5 % SOLN Place 1 drop into both eyes every evening.    . Cholecalciferol (VITAMIN D) 400 UNITS capsule Take 400 Units by mouth 2 (two) times daily.     . furosemide (LASIX) 40 MG tablet Take 40 mg by mouth daily.    . hydrocortisone (PROCTOCORT) 1 % CREA Apply twice daily as needed to relieve hemorrhoid discomfort 28.35 g 5  . ipratropium-albuterol (DUONEB) 0.5-2.5 (3) MG/3ML SOLN Take 3 mLs by nebulization 4 (four) times daily as needed.    Marland Kitchen levothyroxine (SYNTHROID, LEVOTHROID) 125 MCG tablet Take 125 mcg by mouth daily before breakfast.    . LORazepam (ATIVAN) 0.5 MG tablet Take 0.25 mg by mouth at bedtime. Take 1 tab by mouth every four hours as needed    . losartan (COZAAR) 25 MG tablet Take 1 tablet (25 mg total) by mouth daily. 90 tablet 3  . metoprolol (LOPRESSOR) 100 MG tablet Take 100 mg by mouth 2 (two) times daily.    . nitroGLYCERIN (NITROSTAT) 0.4 MG SL tablet Place 0.4 mg under the tongue every 5 (five) minutes as needed for chest pain.     . Nutritional Supplements (BOOST COMPACT) LIQD Take 1 Container by mouth daily. 237 mls daily    . olopatadine (PATANOL) 0.1 % ophthalmic solution Place 1 drop into both eyes 2 (two) times daily.    . OXYGEN Inhale 2 L/min into the lungs as needed.    . polyethylene glycol (MIRALAX / GLYCOLAX) packet Take 17 g by mouth daily.    . polyvinyl alcohol (LIQUIFILM TEARS) 1.4 % ophthalmic solution Place 1 drop into both  eyes. One drop in both eyes twice daily as needed for dry eyes    . protein supplement (RESOURCE BENEPROTEIN) POWD Take 1 scoop by mouth daily as needed.    Marland Kitchen spironolactone (ALDACTONE) 25 MG tablet Take 25 mg by mouth daily.    . tamsulosin (FLOMAX) 0.4 MG CAPS capsule Take one cap by mouth daily    . tobramycin-dexamethasone (TOBRADEX) ophthalmic ointment Place 1 application into both eyes 2 (two) times daily.    Marland Kitchen triamcinolone cream (KENALOG) 0.1 % Apply sparingly up to twice daily to rash until it is resolved 30 g 4   No current facility-administered medications for this visit.     LABS/IMAGING: No results found for this or any previous visit (from the past 48 hour(s)). No results found.  VITALS: BP (!) 102/58   Pulse 62   Ht '5\' 8"'  (1.727 m)   Wt 158 lb 12.8 oz (72 kg)   BMI 24.15 kg/m   EXAM: GEN: Awake, NAD HEENT: PERRLA, EOMI Neck: no JVP Cardiovascular: RRR Abdomen: Soft, nontender, mildly distended Extremities: 1+ bilateral pitting edema Skin: Warm, dry Neurologic: Grossly intact Psych: Pleasant  EKG: Ventricular paced rhythm at 62  ASSESSMENT: 1. Acute on chronic systolic, right sided- congestive heart failure, NYHA Class II symptoms - EF 55-60% 2. Persistent atrial fibrillation 3. Sick sinus syndrome status post pacemaker  placement with normal function 4. Abnormal PFT's - off of amiodarone 5. History of abdominal aortic aneurysm with mild enlargement 6. Coronary artery disease status post CABG in 1989 with stent to the vein graft in 2008 7. Minimal PAD with normal ABIs bilaterally 8. Recent thalamic stroke - now on Eliquis 9. Epistaxis 10. Urinary incontinence 11. Mild bilateral carotid artery disease  PLAN: 1.   Mr. Tschantz seems to be doing well without any chest pain or worsening shortness of breath. His appetite is pretty good. He's not had any worsening swelling or significant weight changes. Blood pressure tends to be running a little bit lower and  I've advised him to decrease his losartan to 25 mg daily. In addition he should be on aspirin 81 mg daily. I've also provided a refill for his nitroglycerin tablets to take as needed. Follow-up with me 6 months.  Pixie Casino, MD, [facility] Attending Cardiologist [place] Khaiden Segreto 11/03/2015, 5:47 PM

## 2015-11-03 NOTE — Patient Instructions (Signed)
DECREASE losartan from 50mg  to 25mg    Your physician wants you to follow-up in: 6 months with Dr. Debara Pickett. You will receive a reminder letter in the mail two months in advance. If you don't receive a letter, please call our office to schedule the follow-up appointment.

## 2015-11-12 ENCOUNTER — Encounter: Payer: Self-pay | Admitting: Internal Medicine

## 2015-11-12 ENCOUNTER — Non-Acute Institutional Stay: Payer: Medicare Other | Admitting: Internal Medicine

## 2015-11-12 DIAGNOSIS — L97309 Non-pressure chronic ulcer of unspecified ankle with unspecified severity: Secondary | ICD-10-CM | POA: Insufficient documentation

## 2015-11-12 DIAGNOSIS — L97311 Non-pressure chronic ulcer of right ankle limited to breakdown of skin: Secondary | ICD-10-CM

## 2015-11-12 NOTE — Progress Notes (Signed)
. Progress Note    Location:   [facility] Room Number: [phone] Place of Service:  [id number]) Provider:  Jeanmarie Hubert, MD  Patient Care Team: [name], MD as PCP - General (Internal Medicine) [facility] Pixie Casino, MD as Consulting Physician (Cardiology) Ardis Hughs, MD as Attending Physician (Urology)  Extended Emergency Contact Information Primary Emergency Contact: [name] of [place] Phone: [phone] Relation: Daughter Secondary Emergency Contact: Eyvonne Mechanic [place] of [place] Phone: [phone] Mobile Phone: [phone] Relation: Daughter  Code Status:  DNR Goals of care: Advanced Directive information Advanced Directives 11/12/2015  Does patient have an advance directive? Yes  Type of Paramedic of [place];Living will;Out of facility DNR (pink MOST or yellow form)  Does patient want to make changes to advanced directive? -  Copy of advanced directive(s) in chart? Yes  Pre-existing out of facility DNR order (yellow form or pink MOST form) -     Chief Complaint  Patient presents with  . Acute Visit    right ankle open area    HPI:  Pt is a 82 y.o. male seen today for an acute visit for Evaluation of his small ulcer of the medial malleolus of the right foot. It is tender and painful. He is worried that it may get infected. It has been present 2-3 weeks. Patient has a previous history of ankle edema, but this is under control. He has diminished dorsalis pedis and posterior tibial pulses bilaterally.   Past Medical History:  Diagnosis Date  . Abnormal PFTs 10/30/2012   Followed in Pulmonary clinic/  Health[facility]/ [facility]  - PFT's 10/22/12 VC  55% no obst, DLCO 50%  - PFT's 12/20/2012 VC 83% and no obst, dLCO 55%  -11/08/2012  Walked RA x 3 laps @ 185 ft each stopped due to  End of study, not desat -11/08/12 esr 10    . Abnormality of gait 04/18/2013   . Acute on chronic systolic congestive heart failure, NYHA class 2 ([place]) 03/16/2006   BNP 376.9 05/28/13   . Adjustment disorder with mixed anxiety and depressed mood 04/18/2013   Post CVA depression and anxiety   . Allergy   . Anxiety   . Aortic aneurysm of unspecified site without mention of rupture 10/27/2011  . Arthritis   . Atherosclerosis of renal artery ([place]) 10/18/2006  . Atrial fibrillation ([place]) 03/04/2011    He was on coumadin until his stroke last December and was switched to apixaban 2.'5mg'$  daily which he has been taking faithfully without reported side effects.    . Balance disorder 07/31/2014  . Basal cell carcinoma of skin of other and unspecified parts of face 12/24/2009  . Blood transfusion without reported diagnosis   . Cardiac pacemaker in situ- MDT 10/11 03/18/2010   Medtronic revo implant in October 2011. Severe sinus bradycardia in the 30s, but not truly pacemaker dependent   . Cataract   . CKD stage 2 due to type 2 diabetes mellitus ([place]) 07/03/2014  . Coronary artery disease   . CVA - Rt brain stroke 12/14 02/18/2013   02/19/13 angiography CT head:  Diffuse atherosclerotic irregularity and plaque formation of the distal right common carotid artery and proximal internal carotid artery without significant stenosis. Plaque ulceration is present and is a source of emboli. A small right thalamic CVA    . DM (diabetes mellitus), type 2 with renal complications ([place]) 7/56/4332  . Dyslipidemia 11/03/2004  .  Fall 03/04/2011  . Fatigue 04/18/2013  . Gout, unspecified 11/03/2004  . Heart murmur   . History of abdominal aortic aneurysm   . History of Doppler ultrasound 05/22/2012   LEAs; R anterior tibial artery appeared occluded; L posterior tibial shows short segment of occlusive ds  . History of Doppler ultrasound 05/22/2012   Abdominal Aortic Doppler; slight increase in fusiform aneurysm   . History of echocardiogram 12/2008   EF >55%; mild concentric LVH; mild MR; mild-mod TR;  mild AV regurg;   . History of nuclear stress test 12/2010   lexiscan; low risk; compared to prior study, perfusion improved  . HTN (hypertension) 03/04/2011  . Hyperlipidemia   . Hypertension   . Hypertrophy of prostate with urinary obstruction and other lower urinary tract symptoms (LUTS) 04/28/2004  . Hyponatremia 03/04/2011  . Hypothyroidism 03/04/2011  . Impotence of organic origin 10/03/2000  . Internal hemorrhoids without mention of complication 02/03/2011  . Long term (current) use of anticoagulants 03/17/2011  . Major depressive disorder, single episode, unspecified (HCC) 03/05/2009  . Memory change 01/24/2013  . Muscle weakness (generalized) 03/10/2011  . Nocturia 10/27/2011  . Obstructive sleep apnea (adult) (pediatric) 07/23/2008  . Open wound of knee, leg (except thigh), and ankle, complicated 07/31/2014  . Osteoarthritis of both knees 07/31/2014  . Osteoarthrosis, unspecified whether generalized or localized, unspecified site 02/05/2003  . PAF (paroxysmal atrial fibrillation) (HCC)    coumadin  . Pain in joint, lower leg 08/04/2011  . Pruritic condition 01/24/2013  . Quadriceps weakness 07/31/2014  . Right bundle branch block 04/19/2006  . 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 Spinal stenosis in cervical region 12/15/2004  . SSS (sick sinus syndrome) (HCC) 04/08/2014  . Stroke (HCC)   . Thyroid disease   . TIA (transient ischemic attack) 04/30/2013  . Type II or unspecified type diabetes mellitus without mention of complication, not stated as uncontrolled 11/24/2004  . Unspecified vitamin D deficiency 10/18/2006  . 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 Increased exposure of the right cornea.    Past Surgical History:  Procedure Laterality Date  . CORONARY ANGIOPLASTY WITH STENT PLACEMENT  2008   stent to SVG to OM  . CORONARY ARTERY BYPASS GRAFT  1989  . EYE SURGERY    . HERNIA REPAIR    . INSERT /  REPLACE / REMOVE PACEMAKER    . JOINT REPLACEMENT    . PACEMAKER INSERTION  2011    Allergies  Allergen Reactions  . Quinolones Other (See Comments)    Risk of rupture of AAA  . Caduet [Amlodipine-Atorvastatin] Other (See Comments)    Weakness   . Crestor [Rosuvastatin] Other (See Comments)    Muscle pain/weakness  . Lipitor [Atorvastatin Calcium] Other (See Comments)    Muscle pain/weakness  . Simvastatin Other (See Comments)    Muscle pain/weakness  . Lisinopril Other (See Comments)    Doesn't remember reaction      Medication List       Accurate as of 11/12/15  4:10 PM. Always use your most recent med list.          acetaminophen 325 MG tablet Commonly known as:  TYLENOL Take 650 mg by mouth every morning.   allopurinol 100 MG tablet Commonly known as:  ZYLOPRIM Take 50 mg by mouth daily.   amoxicillin 500 MG capsule Commonly known as:  AMOXIL Take 500 mg by mouth. Take 4  caps prior to dental procedures   apixaban 2.5 MG Tabs tablet Commonly known as:  ELIQUIS Take 1 tablet (2.5 mg total) by mouth 2 (two) times daily.   BOOST COMPACT Liqd Take 1 Container by mouth daily. 237 mls daily   carboxymethylcellulose 0.5 % Soln Commonly known as:  REFRESH PLUS Place 1 drop into both eyes every evening.   furosemide 40 MG tablet Commonly known as:  LASIX Take 40 mg by mouth daily. Take one tablet as needed if weight gain 3  Lbs in 24 hours or 5 lbs in a week   hydrocortisone 1 % Crea Commonly known as:  PROCTOCORT Apply twice daily as needed to relieve hemorrhoid discomfort   ipratropium-albuterol 0.5-2.5 (3) MG/3ML Soln Commonly known as:  DUONEB Take 3 mLs by nebulization 4 (four) times daily as needed.   levothyroxine 125 MCG tablet Commonly known as:  SYNTHROID, LEVOTHROID Take 125 mcg by mouth daily before breakfast.   LORazepam 0.5 MG tablet Commonly known as:  ATIVAN Take 0.25 mg by mouth at bedtime. Take 1 tab by mouth every four hours as  needed   losartan 25 MG tablet Commonly known as:  COZAAR Take 1 tablet (25 mg total) by mouth daily.   metoprolol 100 MG tablet Commonly known as:  LOPRESSOR Take 100 mg by mouth 2 (two) times daily.   nitroGLYCERIN 0.4 MG SL tablet Commonly known as:  NITROSTAT Place 0.4 mg under the tongue every 5 (five) minutes as needed for chest pain.   olopatadine 0.1 % ophthalmic solution Commonly known as:  PATANOL Place 1 drop into both eyes 2 (two) times daily.   OXYGEN Inhale 2 L/min into the lungs as needed.   polyethylene glycol packet Commonly known as:  MIRALAX / GLYCOLAX Take 17 g by mouth daily.   polyvinyl alcohol 1.4 % ophthalmic solution Commonly known as:  LIQUIFILM TEARS Place 1 drop into both eyes. One drop in both eyes twice daily as needed for dry eyes   protein supplement Powd Take 1 scoop by mouth daily as needed.   spironolactone 25 MG tablet Commonly known as:  ALDACTONE Take 25 mg by mouth daily.   tamsulosin 0.4 MG Caps capsule Commonly known as:  FLOMAX Take one cap by mouth daily   tobramycin-dexamethasone ophthalmic ointment Commonly known as:  TOBRADEX Place 1 application into both eyes 2 (two) times daily.   Vitamin D 400 units capsule Take 400 Units by mouth 2 (two) times daily.       Review of Systems  Constitutional: Positive for unexpected weight change. Negative for fever.       Generalized weakness  HENT: Positive for congestion, hearing loss and rhinorrhea. Negative for ear discharge, ear pain and tinnitus.   Eyes:       Chronnic irritation of the right eye due to ectropion and failure of the lids to close during the day  Respiratory: Positive for cough and shortness of breath. Negative for wheezing.   Cardiovascular: Positive for leg swelling (sometimes, not apparent today. ). Negative for palpitations.       Trace edema BLE, R>L  Gastrointestinal: Negative for abdominal pain and diarrhea.       Chokes on swallowing since his  CVA. Episodes of fecal incontinence. Hx of hemorrhoids.A tender Pilonidal cyst like growth at the right side of anus. Increased gas and fecal passage sometimes when his bladder moves.  Genitourinary: Positive for frequency and urgency.       Incontinent from time  to time. Followed by Dr. Matilde Sprang.  Musculoskeletal: Positive for back pain. Negative for neck pain.  Skin: Negative for rash.       History of itching in the groin and chronic secondary to irritation from urinary incontinence, fecal incontinence, and adult diapering. Ulceration right medial malleolus since mid August 2017.  Neurological: Positive for weakness.       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 Recurrent CVA on 04/30/13 of the left MCA area Right facial weakness  Psychiatric/Behavioral: The patient is not nervous/anxious.     Immunization History  Administered Date(s) Administered  . Influenza Whole 12/06/2011, 12/19/2012  . Influenza-Unspecified 01/09/2014, 11/25/2014  . PPD Test 05/03/2013  . Pneumococcal Polysaccharide-23 03/07/1986  . Td 04/20/2005   Pertinent  Health Maintenance Due  Topic Date Due  . OPHTHALMOLOGY EXAM  01/12/1932  . PNA vac Low Risk Adult (1 of 2 - PCV13) 01/12/1989  . FOOT EXAM  07/31/2015  . INFLUENZA VACCINE  10/06/2015  . HEMOGLOBIN A1C  01/01/2016   Fall Risk  10/15/2015 06/11/2015 10/23/2014 07/07/2014 02/17/2014  Falls in the past year? No Yes Yes Yes Exclusion - non ambulatory  Number falls in past yr: - '1 1 1 '$ -  Injury with Fall? - Yes - No -  Risk for fall due to : - - - History of fall(s);Impaired mobility;Impaired balance/gait -  Follow up - - - Falls evaluation completed;Falls prevention discussed -   Functional Status Survey:    Vitals:   11/12/15 1609  BP: 132/76  Pulse: 61  Resp: 18  Temp: (!) 96.9 F (36.1 C)  Weight: 158 lb (71.7 kg)  Height: '5\' 8"'$  (1.727 m)   Body mass index is 24.02 kg/m. Physical Exam   Constitutional: He is oriented to person, place, and time. He appears well-developed and well-nourished. No distress.  HENT:  Severe hearing loss. Bilateral aides. Right facial droop.   Eyes:  Corrective lenses. Increased exposure of the right cornea due to post CVA facial droop. Irregular pupil on the left.  Neck: No JVD present. No tracheal deviation present. No thyromegaly present.  Cardiovascular: Normal rate and normal heart sounds.  Frequent extrasystoles are present.  No murmur heard. Pacemaker left upper chest  Pulmonary/Chest: Effort normal. He has no wheezes. He has rales.  dry rales posterior lung bases.   Abdominal: Soft. Bowel sounds are normal. He exhibits no distension and no mass. There is no tenderness.  Genitourinary:  Genitourinary Comments: .A tender Pilonidal cyst like growth at the right side of anus  Musculoskeletal: Normal range of motion. He exhibits edema. He exhibits no tenderness.  Unstable gait. Using 4 wheel walker and w/c. Weaker on the right side. Hx of the right total knee replacement. Trace edema BLE, R>L. C/o left knee pain, f/u Ortho. Motorized w/c to go further  Lymphadenopathy:    He has no cervical adenopathy.  Neurological: He is alert and oriented to person, place, and time. He displays normal reflexes. No cranial nerve deficit. He exhibits normal muscle tone. Coordination normal.  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 Recurrent CVA on 04/30/13 of the left MCA area Right facial weakness. Facial asymmetry is less evident today.  Skin: Skin is warm and dry. No rash noted. No erythema. No pallor.  2 mm ulceration of the right medial malleolus. Slight surrounding erythema. Quite tender to touch. Shallow scab on top of it.  Psychiatric: He has a normal mood and affect.  His behavior is normal. Judgment and thought content normal.  Nursing note and vitals reviewed.   Labs reviewed:  Recent Labs   06/16/15 07/02/15 07/09/15  NA 136* 132* 137  K 4.8 4.7 4.3  BUN 55* 50* 32*  CREATININE 1.4* 1.5* 1.1    Recent Labs  06/02/15 06/04/15 07/02/15  AST '17 18 27  '$ ALT '11 11 25  '$ ALKPHOS 103 94 100    Recent Labs  11/25/14 01/16/15 06/02/15  WBC 7.2 7.1 6.9  HGB 15.0 16.0 15.8  HCT 44 48 46  PLT 200 180 326   Lab Results  Component Value Date   TSH 3.19 02/19/2015   Lab Results  Component Value Date   HGBA1C 6.9 07/02/2015   Lab Results  Component Value Date   CHOL 117 05/01/2013   HDL 30 (L) 05/01/2013   LDLCALC 71 05/01/2013   TRIG 79 05/01/2013   CHOLHDL 3.9 05/01/2013    Assessment/Plan 1. Ulcer of ankle, right, limited to breakdown of skin (HCC) -Recommended hydrocolloid bandaging. Patient should have soap and water cleansing each time the bandages change. And he should be changed at least weekly and as needed. Advised patient that once of this nature sometimes take a very long time to heal. I do not think there are obvious signs of significant infection at this point.

## 2015-12-02 ENCOUNTER — Non-Acute Institutional Stay: Payer: Medicare Other | Admitting: Nurse Practitioner

## 2015-12-02 ENCOUNTER — Encounter: Payer: Self-pay | Admitting: Nurse Practitioner

## 2015-12-02 DIAGNOSIS — F0631 Mood disorder due to known physiological condition with depressive features: Secondary | ICD-10-CM

## 2015-12-02 DIAGNOSIS — K5900 Constipation, unspecified: Secondary | ICD-10-CM

## 2015-12-02 DIAGNOSIS — E1151 Type 2 diabetes mellitus with diabetic peripheral angiopathy without gangrene: Secondary | ICD-10-CM

## 2015-12-02 DIAGNOSIS — R609 Edema, unspecified: Secondary | ICD-10-CM

## 2015-12-02 DIAGNOSIS — I639 Cerebral infarction, unspecified: Secondary | ICD-10-CM | POA: Diagnosis not present

## 2015-12-02 DIAGNOSIS — I5022 Chronic systolic (congestive) heart failure: Secondary | ICD-10-CM

## 2015-12-02 DIAGNOSIS — E039 Hypothyroidism, unspecified: Secondary | ICD-10-CM

## 2015-12-02 DIAGNOSIS — M170 Bilateral primary osteoarthritis of knee: Secondary | ICD-10-CM

## 2015-12-02 DIAGNOSIS — R413 Other amnesia: Secondary | ICD-10-CM

## 2015-12-02 DIAGNOSIS — K219 Gastro-esophageal reflux disease without esophagitis: Secondary | ICD-10-CM

## 2015-12-02 DIAGNOSIS — I10 Essential (primary) hypertension: Secondary | ICD-10-CM

## 2015-12-02 DIAGNOSIS — E1121 Type 2 diabetes mellitus with diabetic nephropathy: Secondary | ICD-10-CM | POA: Diagnosis not present

## 2015-12-02 DIAGNOSIS — M1000 Idiopathic gout, unspecified site: Secondary | ICD-10-CM

## 2015-12-02 DIAGNOSIS — I4819 Other persistent atrial fibrillation: Secondary | ICD-10-CM

## 2015-12-02 DIAGNOSIS — Z7901 Long term (current) use of anticoagulants: Secondary | ICD-10-CM | POA: Diagnosis not present

## 2015-12-02 DIAGNOSIS — IMO0002 Reserved for concepts with insufficient information to code with codable children: Secondary | ICD-10-CM

## 2015-12-02 DIAGNOSIS — I481 Persistent atrial fibrillation: Secondary | ICD-10-CM | POA: Diagnosis not present

## 2015-12-02 DIAGNOSIS — N401 Enlarged prostate with lower urinary tract symptoms: Secondary | ICD-10-CM

## 2015-12-02 DIAGNOSIS — L97301 Non-pressure chronic ulcer of unspecified ankle limited to breakdown of skin: Secondary | ICD-10-CM

## 2015-12-02 NOTE — Assessment & Plan Note (Signed)
Controlled, continue Metoprolol 100 mg twice a day, Losartan 50mg , Furosemide 40mg  qd, Spironolactone 25mg 

## 2015-12-02 NOTE — Assessment & Plan Note (Signed)
Ambulates with walker, w/c to go further.  

## 2015-12-02 NOTE — Assessment & Plan Note (Signed)
Stable, continue Benefiber and daily MiraLax

## 2015-12-02 NOTE — Assessment & Plan Note (Signed)
Stable, Continue Lorazepam 0.25mg  qhs

## 2015-12-02 NOTE — Assessment & Plan Note (Signed)
stable °

## 2015-12-02 NOTE — Assessment & Plan Note (Signed)
urinary frequency last night, continue Tamsulosin 0.4mg 

## 2015-12-02 NOTE — Assessment & Plan Note (Signed)
09/16/14 TSH 2.965 01/15/15 7.202 02/19/15 TSH 3.194 06/16/15 TSH 1.86 Continue Levothyroxine 177mcg

## 2015-12-02 NOTE — Assessment & Plan Note (Signed)
07/09/15 Na 137. K 4.3, Bun 32, creat 1.09. Continue Furosemide and Spironolactone. No apparent edema in BLE

## 2015-12-02 NOTE — Assessment & Plan Note (Signed)
Rate controlled, continue Metoprolol 100mg  bid. Eliquis for VTE risk reduction.

## 2015-12-02 NOTE — Assessment & Plan Note (Signed)
Continue Eliquis 2.5 mg b.i.d..

## 2015-12-02 NOTE — Progress Notes (Signed)
Location:  [place] Room Number: [place] of Service:  [id number]) Provider:  Mast, Manxie  NP  Jeanmarie Hubert, MD  Patient Care Team: [name], MD as PCP - General (Internal Medicine) [facility] Pixie Casino, MD as Consulting Physician (Cardiology) Ardis Hughs, MD as Attending Physician (Urology)  Extended Emergency Contact Information Primary Emergency Contact: [name] of [place] Phone: [phone] Relation: Daughter Secondary Emergency Contact: Eyvonne Mechanic [place] of [place] Phone: [phone] Mobile Phone: [phone] Relation: Daughter  Code Status:  Full Code Goals of care: Advanced Directive information Advanced Directives 12/02/2015  Does patient have an advance directive? Yes  Type of Paramedic of [place];Living will;Out of facility DNR (pink MOST or yellow form)  Does patient want to make changes to advanced directive? No - Patient declined  Copy of advanced directive(s) in chart? Yes  Pre-existing out of facility DNR order (yellow form or pink MOST form) -     Chief Complaint  Patient presents with  . Medical Management of Chronic Issues    HPI:  Pt is a 82 y.o. male seen today for medical management of chronic diseases.     Hx of hypothyroidism, takingLevothyroxine 161mg, TSH 1.86 06/16/15. CVA, CHF, HTN, anxiety, urinary frequency. CHF: less SOB, dry rales back of lungs, on Furosemide 434m Spironolactone 2532maily. BPH: no urinary retention, but dribbling, taking Tamsulosin 0.4mg55mily. Gout: no flare ups, taking Allopurinol 50mg15mib/late effect of CVA: heart rate is in control, left facial weakness, ambulates with walker, taking Eliquis Past Medical History:  Diagnosis Date  . Abnormal PFTs 10/30/2012   Followed in Pulmonary clinic/ [facility]/ [place]  - PFT's 10/22/12 VC  55% no obst, DLCO 50%  - PFT's 12/20/2012 VC 83% and no  obst, dLCO 55%  -11/08/2012  Walked RA x 3 laps @ 185 ft each stopped due to  End of study, not desat -11/08/12 esr 10    . Abnormality of gait 04/18/2013  . Acute on chronic systolic congestive heart failure, NYHA class 2 (HCC) [place]0/2008   BNP 376.9 05/28/13   . Adjustment disorder with mixed anxiety and depressed mood 04/18/2013   Post CVA depression and anxiety   . Allergy   . Anxiety   . Aortic aneurysm of unspecified site without mention of rupture 10/27/2011  . Arthritis   . Atherosclerosis of renal artery (HCC) [place]3/2008  . Atrial fibrillation (HCC) [place]28/2012    He was on coumadin until his stroke last December and was switched to apixaban 2.5mg d5my which he has been taking faithfully without reported side effects.    . Balance disorder 07/31/2014  . Basal cell carcinoma of skin of other and unspecified parts of face 12/24/2009  . Blood transfusion without reported diagnosis   . Cardiac pacemaker in situ- MDT 10/11 03/18/2010   Medtronic revo implant in October 2011. Severe sinus bradycardia in the 30s, but not truly pacemaker dependent   . Cataract   . CKD stage 2 due to type 2 diabetes mellitus (HCC) 4[place]/2016  . Coronary artery disease   . CVA - Rt brain stroke 12/14 02/18/2013   02/19/13 angiography CT head:  Diffuse atherosclerotic irregularity and plaque formation of the distal right common carotid artery and proximal internal carotid artery without significant stenosis. Plaque ulceration is present and is a source of emboli. A small right thalamic CVA    . DM (diabetes mellitus), type 2 with renal  complications ([place]) 4/43/1540  . Dyslipidemia 11/03/2004  . Fall 03/04/2011  . Fatigue 04/18/2013  . Gout, unspecified 11/03/2004  . Heart murmur   . History of abdominal aortic aneurysm   . History of Doppler ultrasound 05/22/2012   LEAs; R anterior tibial artery appeared occluded; L posterior tibial shows short segment of occlusive ds  . History of Doppler ultrasound 05/22/2012    Abdominal Aortic Doppler; slight increase in fusiform aneurysm   . History of echocardiogram 12/2008   EF >55%; mild concentric LVH; mild MR; mild-mod TR; mild AV regurg;   . History of nuclear stress test 12/2010   lexiscan; low risk; compared to prior study, perfusion improved  . HTN (hypertension) 03/04/2011  . Hyperlipidemia   . Hypertension   . Hypertrophy of prostate with urinary obstruction and other lower urinary tract symptoms (LUTS) 04/28/2004  . Hyponatremia 03/04/2011  . Hypothyroidism 03/04/2011  . Impotence of organic origin 10/03/2000  . Internal hemorrhoids without mention of complication 08/67/6195  . Long term (current) use of anticoagulants 03/17/2011  . Major depressive disorder, single episode, unspecified ([place]) 03/05/2009  . Memory change 01/24/2013  . Muscle weakness (generalized) 03/10/2011  . Nocturia 10/27/2011  . Obstructive sleep apnea (adult) (pediatric) 07/23/2008  . Open wound of knee, leg (except thigh), and ankle, complicated 0/93/2671  . Osteoarthritis of both knees 07/31/2014  . Osteoarthrosis, unspecified whether generalized or localized, unspecified site 02/05/2003  . PAF (paroxysmal atrial fibrillation) (HCC)    coumadin  . Pain in joint, lower leg 08/04/2011  . Pruritic condition 01/24/2013  . Quadriceps weakness 07/31/2014  . Right bundle branch block 04/19/2006  . 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 Spinal stenosis in cervical region 12/15/2004  . SSS (sick sinus syndrome) ([place]) 04/08/2014  . Stroke ([place])   . Thyroid disease   . TIA (transient ischemic attack) 04/30/2013  . Type II or unspecified type diabetes mellitus without mention of complication, not stated as uncontrolled 11/24/2004  . Unspecified vitamin D deficiency 10/18/2006  . 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 Increased exposure of the right cornea.    Past Surgical History:  Procedure Laterality Date  .  CORONARY ANGIOPLASTY WITH STENT PLACEMENT  2008   stent to SVG to OM  . CORONARY ARTERY BYPASS GRAFT  1989  . EYE SURGERY    . HERNIA REPAIR    . INSERT / REPLACE / REMOVE PACEMAKER    . JOINT REPLACEMENT    . PACEMAKER INSERTION  2011    Allergies  Allergen Reactions  . Quinolones Other (See Comments)    Risk of rupture of AAA  . Caduet [Amlodipine-Atorvastatin] Other (See Comments)    Weakness   . Crestor [Rosuvastatin] Other (See Comments)    Muscle pain/weakness  . Lipitor [Atorvastatin Calcium] Other (See Comments)    Muscle pain/weakness  . Simvastatin Other (See Comments)    Muscle pain/weakness  . Lisinopril Other (See Comments)    Doesn't remember reaction      Medication List       Accurate as of 12/02/15  2:24 PM. Always use your most recent med list.          acetaminophen 325 MG tablet Commonly known as:  TYLENOL Take 325 mg by mouth daily.   allopurinol 100 MG tablet Commonly known as:  ZYLOPRIM Take 50 mg by mouth daily.   amoxicillin 500 MG capsule Commonly known as:  AMOXIL Take 500 mg by mouth. Take 4 caps prior to dental procedures   apixaban 2.5 MG Tabs tablet Commonly known as:  ELIQUIS Take 1 tablet (2.5 mg total) by mouth 2 (two) times daily.   aspirin EC 81 MG tablet Take 81 mg by mouth daily.   BOOST COMPACT Liqd Take 1 Container by mouth daily. 237 mls daily   carboxymethylcellulose 0.5 % Soln Commonly known as:  REFRESH PLUS Place 1 drop into both eyes every evening.   furosemide 40 MG tablet Commonly known as:  LASIX Take 40 mg by mouth daily. Take one tablet as needed if weight gain 3  Lbs in 24 hours or 5 lbs in a week   hydrocortisone 1 % Crea Commonly known as:  PROCTOCORT Apply twice daily as needed to relieve hemorrhoid discomfort   ipratropium-albuterol 0.5-2.5 (3) MG/3ML Soln Commonly known as:  DUONEB Take 3 mLs by nebulization 4 (four) times daily as needed.   levothyroxine 125 MCG tablet Commonly known  as:  SYNTHROID, LEVOTHROID Take 125 mcg by mouth daily before breakfast.   LORazepam 0.5 MG tablet Commonly known as:  ATIVAN Take 0.25 mg by mouth at bedtime. Take 1 tab by mouth every four hours as needed   losartan 25 MG tablet Commonly known as:  COZAAR Take 1 tablet (25 mg total) by mouth daily.   metoprolol 100 MG tablet Commonly known as:  LOPRESSOR Take 100 mg by mouth 2 (two) times daily.   nitroGLYCERIN 0.4 MG SL tablet Commonly known as:  NITROSTAT Place 0.4 mg under the tongue every 5 (five) minutes as needed for chest pain.   olopatadine 0.1 % ophthalmic solution Commonly known as:  PATANOL Place 1 drop into both eyes 2 (two) times daily.   OXYGEN Inhale 2 L/min into the lungs as needed.   polyethylene glycol packet Commonly known as:  MIRALAX / GLYCOLAX Take 17 g by mouth daily.   polyvinyl alcohol 1.4 % ophthalmic solution Commonly known as:  LIQUIFILM TEARS Place 1 drop into both eyes. One drop in both eyes twice daily as needed for dry eyes   protein supplement Powd Take 1 scoop by mouth daily as needed.   spironolactone 25 MG tablet Commonly known as:  ALDACTONE Take 25 mg by mouth daily.   tamsulosin 0.4 MG Caps capsule Commonly known as:  FLOMAX Take one cap by mouth daily   tobramycin-dexamethasone ophthalmic ointment Commonly known as:  TOBRADEX Place 1 application into both eyes 2 (two) times daily.   Vitamin D 400 units capsule Take 400 Units by mouth 2 (two) times daily.       Review of Systems  Constitutional: Positive for unexpected weight change. Negative for fever.       Generalized weakness  HENT: Positive for congestion, hearing loss and rhinorrhea. Negative for ear discharge, ear pain and tinnitus.   Eyes:       Chronnic irritation of the right eye due to ectropion and failure of the lids to close during the day  Respiratory: Positive for cough and shortness of breath. Negative for wheezing.   Cardiovascular: Positive for  leg swelling (sometimes, not apparent today. ). Negative for palpitations.       Trace edema BLE, R>L  Gastrointestinal: Negative for abdominal pain and diarrhea.       Chokes on swallowing since his CVA. Episodes of fecal incontinence. Hx of hemorrhoids.A tender Pilonidal cyst like growth at the right side of anus. Increased gas and fecal passage  sometimes when his bladder moves.  Genitourinary: Positive for frequency and urgency.       Incontinent from time to time. Followed by Dr. Matilde Sprang.  Musculoskeletal: Positive for back pain. Negative for neck pain.  Skin: Negative for rash.       History of itching in the groin and chronic secondary to irritation from urinary incontinence, fecal incontinence, and adult diapering. Ulceration right medial malleolus since mid August 2017.  Neurological: Positive for weakness.       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 Recurrent CVA on 04/30/13 of the left MCA area Right facial weakness  Psychiatric/Behavioral: The patient is not nervous/anxious.     Immunization History  Administered Date(s) Administered  . Influenza Whole 12/06/2011, 12/19/2012  . Influenza-Unspecified 01/09/2014, 11/25/2014  . PPD Test 05/03/2013  . Pneumococcal Polysaccharide-23 03/07/1986  . Td 04/20/2005   Pertinent  Health Maintenance Due  Topic Date Due  . OPHTHALMOLOGY EXAM  01/12/1932  . PNA vac Low Risk Adult (1 of 2 - PCV13) 01/12/1989  . FOOT EXAM  07/31/2015  . INFLUENZA VACCINE  10/06/2015  . HEMOGLOBIN A1C  01/01/2016   Fall Risk  10/15/2015 06/11/2015 10/23/2014 07/07/2014 02/17/2014  Falls in the past year? No Yes Yes Yes Exclusion - non ambulatory  Number falls in past yr: - _0 -  Injury with Fall? - Yes - No -  Risk for fall due to : - - - History of fall(s);Impaired mobility;Impaired balance/gait -  Follow up - - - Falls evaluation completed;Falls prevention discussed -   Functional Status Survey:      Vitals:   12/02/15 1353  BP: 124/70  Pulse: 82  Resp: 18  Temp: 97.2 F (36.2 C)  Weight: 155 lb 3.2 oz (70.4 kg)  Height: _1  (1.727 m)   Body mass index is 23.6 kg/m. Physical Exam  Constitutional: He is oriented to person, place, and time. He appears well-developed and well-nourished. No distress.  HENT:  Severe hearing loss. Bilateral aides. Right facial droop.   Eyes:  Corrective lenses. Increased exposure of the right cornea due to post CVA facial droop. Irregular pupil on the left.  Neck: No JVD present. No tracheal deviation present. No thyromegaly present.  Cardiovascular: Normal rate and normal heart sounds.  Frequent extrasystoles are present.  No murmur heard. Pacemaker left upper chest  Pulmonary/Chest: Effort normal. He has no wheezes. He has rales.  dry rales posterior lung bases.   Abdominal: Soft. Bowel sounds are normal. He exhibits no distension and no mass. There is no tenderness.  Genitourinary:  Genitourinary Comments: .A tender Pilonidal cyst like growth at the right side of anus  Musculoskeletal: Normal range of motion. He exhibits edema. He exhibits no tenderness.  Unstable gait. Using 4 wheel walker and w/c. Weaker on the right side. Hx of the right total knee replacement. Trace edema BLE, R>L. C/o left knee pain, f/u Ortho. Motorized w/c to go further  Lymphadenopathy:    He has no cervical adenopathy.  Neurological: He is alert and oriented to person, place, and time. He displays normal reflexes. No cranial nerve deficit. He exhibits normal muscle tone. Coordination normal.  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 Recurrent CVA on 04/30/13 of the left MCA area Right facial weakness. Facial asymmetry is less evident today.  Skin: Skin is warm and dry. No rash noted. No erythema. No pallor.  2 mm ulceration of the right medial  malleolus. Slight surrounding erythema. Quite tender to touch. Shallow scab on  top of it.  Psychiatric: He has a normal mood and affect. His behavior is normal. Judgment and thought content normal.  Nursing note and vitals reviewed.   Labs reviewed:  Recent Labs  06/16/15 07/02/15 07/09/15  NA 136* 132* 137  K 4.8 4.7 4.3  BUN 55* 50* 32*  CREATININE 1.4* 1.5* 1.1    Recent Labs  06/02/15 06/04/15 07/02/15  AST _0 ALT _1 ALKPHOS 103 94 100    Recent Labs  01/16/15 06/02/15  WBC 7.1 6.9  HGB 16.0 15.8  HCT 48 46  PLT 180 326   Lab Results  Component Value Date   TSH 3.19 02/19/2015   Lab Results  Component Value Date   HGBA1C 6.9 07/02/2015   Lab Results  Component Value Date   CHOL 117 05/01/2013   HDL 30 (L) 05/01/2013   LDLCALC 71 05/01/2013   TRIG 79 05/01/2013   CHOLHDL 3.9 05/01/2013    Significant Diagnostic Results in last 30 days:  No results found.  Assessment/Plan Atrial fibrillation Rate controlled, continue Metoprolol 154m bid. Eliquis for VTE risk reduction.    HTN (hypertension) Controlled, continue Metoprolol 100 mg twice a day, Losartan 544m Furosemide 4065md, Spironolactone 80m59m  Diabetes mellitus, type 2, with circulatory disorder (HCC) Diet controlled.   Chronic systolic congestive heart failure (HCC)Genevampensated clinically, continue Furosemide 40mg75m prn Furosemide. Spironolactone 80mg.72mERD (gastroesophageal reflux disease) stable   Constipation Stable, continue Benefiber and daily MiraLax   Hypothyroidism 09/16/14 TSH 2.965 01/15/15 7.202 02/19/15 TSH 3.194 06/16/15 TSH 1.86 Continue Levothyroxine 180mcg 42m(diabetes mellitus), type 2 with renal complications Diet controlled. 07/09/15 Bun 32, creat 1.1  Osteoarthritis of both knees Ambulates with walker, w/c to go further.   Enlarged prostate with lower urinary tract symptoms (LUTS) urinary frequency last night, continue Tamsulosin 0.4mg  Lo56mterm current use of anticoagulant therapy Continue Eliquis 2.5mg  bid81m Depression due to stroke Stable, Continue Lorazepam 0.80mg qhs 69mema 07/09/15 Na 137. K 4.3, Bun 32, creat 1.09. Continue Furosemide and Spironolactone. No apparent edema in BLE  Gout No flare ups. continue Allopurinol 50mg daily42memory change Functioning well in AL  Ulcer of ankle (HCC) [place] right [place].      Family/ staff Communication:continue AL for care assistance.   Labs/tests ordered:  none

## 2015-12-02 NOTE — Assessment & Plan Note (Signed)
Healing nicely medial malleolus right foot.

## 2015-12-02 NOTE — Assessment & Plan Note (Signed)
Functioning well in AL

## 2015-12-02 NOTE — Assessment & Plan Note (Signed)
compensated clinically, continue Furosemide 40mg  qd, prn Furosemide. Spironolactone 25mg .

## 2015-12-02 NOTE — Assessment & Plan Note (Signed)
No flare ups. continue Allopurinol 50mg daily.  

## 2015-12-02 NOTE — Assessment & Plan Note (Signed)
Diet controlled. 07/09/15 Bun 32, creat 1.1

## 2015-12-02 NOTE — Assessment & Plan Note (Signed)
Diet controlled.  

## 2015-12-16 DIAGNOSIS — Z85828 Personal history of other malignant neoplasm of skin: Secondary | ICD-10-CM | POA: Diagnosis not present

## 2015-12-16 DIAGNOSIS — L821 Other seborrheic keratosis: Secondary | ICD-10-CM | POA: Diagnosis not present

## 2015-12-16 DIAGNOSIS — L853 Xerosis cutis: Secondary | ICD-10-CM | POA: Diagnosis not present

## 2015-12-17 ENCOUNTER — Encounter (INDEPENDENT_AMBULATORY_CARE_PROVIDER_SITE_OTHER): Payer: Medicare Other | Admitting: Ophthalmology

## 2015-12-17 DIAGNOSIS — I10 Essential (primary) hypertension: Secondary | ICD-10-CM | POA: Diagnosis not present

## 2015-12-17 DIAGNOSIS — H43813 Vitreous degeneration, bilateral: Secondary | ICD-10-CM

## 2015-12-17 DIAGNOSIS — H35033 Hypertensive retinopathy, bilateral: Secondary | ICD-10-CM

## 2015-12-17 DIAGNOSIS — H338 Other retinal detachments: Secondary | ICD-10-CM | POA: Diagnosis not present

## 2015-12-24 DIAGNOSIS — H16141 Punctate keratitis, right eye: Secondary | ICD-10-CM | POA: Diagnosis not present

## 2015-12-24 DIAGNOSIS — Z961 Presence of intraocular lens: Secondary | ICD-10-CM | POA: Diagnosis not present

## 2015-12-30 ENCOUNTER — Encounter: Payer: Self-pay | Admitting: Nurse Practitioner

## 2015-12-30 ENCOUNTER — Non-Acute Institutional Stay: Payer: Medicare Other | Admitting: Nurse Practitioner

## 2015-12-30 DIAGNOSIS — E1122 Type 2 diabetes mellitus with diabetic chronic kidney disease: Secondary | ICD-10-CM | POA: Diagnosis not present

## 2015-12-30 DIAGNOSIS — E039 Hypothyroidism, unspecified: Secondary | ICD-10-CM | POA: Diagnosis not present

## 2015-12-30 DIAGNOSIS — I639 Cerebral infarction, unspecified: Secondary | ICD-10-CM

## 2015-12-30 DIAGNOSIS — K5900 Constipation, unspecified: Secondary | ICD-10-CM | POA: Diagnosis not present

## 2015-12-30 DIAGNOSIS — R609 Edema, unspecified: Secondary | ICD-10-CM

## 2015-12-30 DIAGNOSIS — I10 Essential (primary) hypertension: Secondary | ICD-10-CM

## 2015-12-30 DIAGNOSIS — E1151 Type 2 diabetes mellitus with diabetic peripheral angiopathy without gangrene: Secondary | ICD-10-CM | POA: Diagnosis not present

## 2015-12-30 DIAGNOSIS — I5022 Chronic systolic (congestive) heart failure: Secondary | ICD-10-CM

## 2015-12-30 DIAGNOSIS — M1000 Idiopathic gout, unspecified site: Secondary | ICD-10-CM

## 2015-12-30 DIAGNOSIS — N401 Enlarged prostate with lower urinary tract symptoms: Secondary | ICD-10-CM

## 2015-12-30 DIAGNOSIS — K219 Gastro-esophageal reflux disease without esophagitis: Secondary | ICD-10-CM

## 2015-12-30 DIAGNOSIS — I480 Paroxysmal atrial fibrillation: Secondary | ICD-10-CM | POA: Diagnosis not present

## 2015-12-30 DIAGNOSIS — M170 Bilateral primary osteoarthritis of knee: Secondary | ICD-10-CM | POA: Diagnosis not present

## 2015-12-30 DIAGNOSIS — F0631 Mood disorder due to known physiological condition with depressive features: Secondary | ICD-10-CM

## 2015-12-30 DIAGNOSIS — IMO0002 Reserved for concepts with insufficient information to code with codable children: Secondary | ICD-10-CM

## 2015-12-30 DIAGNOSIS — N182 Chronic kidney disease, stage 2 (mild): Secondary | ICD-10-CM

## 2015-12-30 NOTE — Assessment & Plan Note (Signed)
Diet controlled. Update Hgb a1c.  

## 2015-12-30 NOTE — Assessment & Plan Note (Signed)
Stable, Continue Lorazepam 0.25mg  qhs

## 2015-12-30 NOTE — Assessment & Plan Note (Signed)
Controlled, continue Metoprolol 100 mg twice a day, Losartan 50mg , Furosemide 40mg  qd, Spironolactone 25mg   Update CBC, CMP, BNP

## 2015-12-30 NOTE — Assessment & Plan Note (Signed)
Stable, continue Benefiber and daily MiraLax

## 2015-12-30 NOTE — Assessment & Plan Note (Signed)
Stable, off acid reducer 

## 2015-12-30 NOTE — Assessment & Plan Note (Signed)
09/16/14 TSH 2.965 01/15/15 7.202 02/19/15 TSH 3.194 06/16/15 TSH 1.86 Continue Levothyroxine 137mcg Update TSH

## 2015-12-30 NOTE — Assessment & Plan Note (Signed)
Last Bun 32, creat 1.09 07/09/15, update CMP

## 2015-12-30 NOTE — Assessment & Plan Note (Signed)
urinary frequency at night, continue Tamsulosin 0.4mg 

## 2015-12-30 NOTE — Assessment & Plan Note (Signed)
Ambulates with walker, w/c to go further.  

## 2015-12-30 NOTE — Assessment & Plan Note (Signed)
compensated clinically, continue Furosemide 40mg  qd, prn Furosemide. Spironolactone 25mg . Update CMP, BNP

## 2015-12-30 NOTE — Progress Notes (Signed)
Location:  [facility] Room Number: 902-A Place of Service:  ALF [phone]) Provider:  Mast, Manxie  NP  Jeanmarie Hubert, MD  Patient Care Team: [name], MD as PCP - General (Internal Medicine) [facility] Pixie Casino, MD as Consulting Physician (Cardiology) Ardis Hughs, MD as Attending Physician (Urology)  Extended Emergency Contact Information Primary Emergency Contact: [name] of [place] Phone: [phone] Relation: Daughter Secondary Emergency Contact: Eyvonne Mechanic [place] of [place] Phone: [phone] Mobile Phone: [phone] Relation: Daughter  Code Status: DNR Goals of care: Advanced Directive information Advanced Directives 12/30/2015  Does patient have an advance directive? Yes  Type of Paramedic of [place];Living will;Out of facility DNR (pink MOST or yellow form)  Does patient want to make changes to advanced directive? No - Patient declined  Copy of advanced directive(s) in chart? Yes  Pre-existing out of facility DNR order (yellow form or pink MOST form) -     Chief Complaint  Patient presents with  . Medical Management of Chronic Issues    HPI:  Pt is a 82 y.o. male seen today for medical management of chronic diseases.    Hx of hypothyroidism, takingLevothyroxine 145mg, TSH 1.86 06/16/15. CVA, CHF, HTN, anxiety, urinary frequency. CHF: less SOB, dry rales back of lungs, on Furosemide 476m Spironolactone 2560maily. BPH: no urinary retention, but dribbling, taking Tamsulosin 0.4mg28mily. Gout: no flare ups, taking Allopurinol 50mg76mib/late effect of CVA: heart rate is in control, left facial weakness, ambulates with walker, taking Eliquis   Past Medical History:  Diagnosis Date  . Abnormal PFTs 10/30/2012   Followed in Pulmonary clinic/ [facility]/ [place]  - PFT's 10/22/12 VC  55% no obst, DLCO 50%  - PFT's 12/20/2012 VC 83% and no  obst, dLCO 55%  -11/08/2012  Walked RA x 3 laps @ 185 ft each stopped due to  End of study, not desat -11/08/12 esr 10    . Abnormality of gait 04/18/2013  . Acute on chronic systolic congestive heart failure, NYHA class 2 (HCC) [place]0/2008   BNP 376.9 05/28/13   . Adjustment disorder with mixed anxiety and depressed mood 04/18/2013   Post CVA depression and anxiety   . Allergy   . Anxiety   . Aortic aneurysm of unspecified site without mention of rupture 10/27/2011  . Arthritis   . Atherosclerosis of renal artery (HCC) [place]3/2008  . Atrial fibrillation (HCC) [place]28/2012    He was on coumadin until his stroke last December and was switched to apixaban 2.5mg d11my which he has been taking faithfully without reported side effects.    . Balance disorder 07/31/2014  . Basal cell carcinoma of skin of other and unspecified parts of face 12/24/2009  . Blood transfusion without reported diagnosis   . Cardiac pacemaker in situ- MDT 10/11 03/18/2010   Medtronic revo implant in October 2011. Severe sinus bradycardia in the 30s, but not truly pacemaker dependent   . Cataract   . CKD stage 2 due to type 2 diabetes mellitus (HCC) 4[place]/2016  . Coronary artery disease   . CVA - Rt brain stroke 12/14 02/18/2013   02/19/13 angiography CT head:  Diffuse atherosclerotic irregularity and plaque formation of the distal right common carotid artery and proximal internal carotid artery without significant stenosis. Plaque ulceration is present and is a source of emboli. A small right thalamic CVA    . DM (diabetes mellitus), type 2 with renal complications (  [place]) 07/03/2014  . Dyslipidemia 11/03/2004  . Fall 03/04/2011  . Fatigue 04/18/2013  . Gout, unspecified 11/03/2004  . Heart murmur   . History of abdominal aortic aneurysm   . History of Doppler ultrasound 05/22/2012   LEAs; R anterior tibial artery appeared occluded; L posterior tibial shows short segment of occlusive ds  . History of Doppler ultrasound 05/22/2012    Abdominal Aortic Doppler; slight increase in fusiform aneurysm   . History of echocardiogram 12/2008   EF >55%; mild concentric LVH; mild MR; mild-mod TR; mild AV regurg;   . History of nuclear stress test 12/2010   lexiscan; low risk; compared to prior study, perfusion improved  . HTN (hypertension) 03/04/2011  . Hyperlipidemia   . Hypertension   . Hypertrophy of prostate with urinary obstruction and other lower urinary tract symptoms (LUTS) 04/28/2004  . Hyponatremia 03/04/2011  . Hypothyroidism 03/04/2011  . Impotence of organic origin 10/03/2000  . Internal hemorrhoids without mention of complication 53/74/8270  . Long term (current) use of anticoagulants 03/17/2011  . Major depressive disorder, single episode, unspecified 03/05/2009  . Memory change 01/24/2013  . Muscle weakness (generalized) 03/10/2011  . Nocturia 10/27/2011  . Obstructive sleep apnea (adult) (pediatric) 07/23/2008  . Open wound of knee, leg (except thigh), and ankle, complicated 7/86/7544  . Osteoarthritis of both knees 07/31/2014  . Osteoarthrosis, unspecified whether generalized or localized, unspecified site 02/05/2003  . PAF (paroxysmal atrial fibrillation) (HCC)    coumadin  . Pain in joint, lower leg 08/04/2011  . Pruritic condition 01/24/2013  . Quadriceps weakness 07/31/2014  . Right bundle branch block 04/19/2006  . 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 Spinal stenosis in cervical region 12/15/2004  . SSS (sick sinus syndrome) ([place]) 04/08/2014  . Stroke ([place])   . Thyroid disease   . TIA (transient ischemic attack) 04/30/2013  . Type II or unspecified type diabetes mellitus without mention of complication, not stated as uncontrolled 11/24/2004  . Unspecified vitamin D deficiency 10/18/2006  . 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 Increased exposure of the right cornea.    Past Surgical History:  Procedure Laterality Date  . CORONARY  ANGIOPLASTY WITH STENT PLACEMENT  2008   stent to SVG to OM  . CORONARY ARTERY BYPASS GRAFT  1989  . EYE SURGERY    . HERNIA REPAIR    . INSERT / REPLACE / REMOVE PACEMAKER    . JOINT REPLACEMENT    . PACEMAKER INSERTION  2011    Allergies  Allergen Reactions  . Quinolones Other (See Comments)    Risk of rupture of AAA  . Caduet [Amlodipine-Atorvastatin] Other (See Comments)    Weakness   . Crestor [Rosuvastatin] Other (See Comments)    Muscle pain/weakness  . Lipitor [Atorvastatin Calcium] Other (See Comments)    Muscle pain/weakness  . Simvastatin Other (See Comments)    Muscle pain/weakness  . Lisinopril Other (See Comments)    Doesn't remember reaction      Medication List       Accurate as of 12/30/15  2:21 PM. Always use your most recent med list.          acetaminophen 325 MG tablet Commonly known as:  TYLENOL Take 325 mg by mouth daily.   allopurinol 100 MG tablet Commonly known as:  ZYLOPRIM Take 50 mg by mouth daily.   amoxicillin 500 MG capsule Commonly known as:  AMOXIL Take  500 mg by mouth. Take 4 caps prior to dental procedures   apixaban 2.5 MG Tabs tablet Commonly known as:  ELIQUIS Take 1 tablet (2.5 mg total) by mouth 2 (two) times daily.   aspirin EC 81 MG tablet Take 81 mg by mouth daily.   BOOST COMPACT Liqd Take 1 Container by mouth daily. 237 mls daily   carbamide peroxide 6.5 % otic solution Commonly known as:  DEBROX Place 5 drops of medication into affected ear at night for 3 days, then irrigate with bulb syringe.   carboxymethylcellulose 0.5 % Soln Commonly known as:  REFRESH PLUS Place 1 drop into both eyes every evening.   furosemide 40 MG tablet Commonly known as:  LASIX Take 40 mg by mouth daily. Take one tablet as needed if weight gain 3  Lbs in 24 hours or 5 lbs in a week   hydrocortisone 1 % Crea Commonly known as:  PROCTOCORT Apply twice daily as needed to relieve hemorrhoid discomfort   ipratropium-albuterol  0.5-2.5 (3) MG/3ML Soln Commonly known as:  DUONEB Take 3 mLs by nebulization 4 (four) times daily as needed.   levothyroxine 125 MCG tablet Commonly known as:  SYNTHROID, LEVOTHROID Take 125 mcg by mouth daily before breakfast.   LORazepam 0.5 MG tablet Commonly known as:  ATIVAN Take 0.25 mg by mouth at bedtime. Take 1 tab by mouth every four hours as needed   losartan 25 MG tablet Commonly known as:  COZAAR Take 1 tablet (25 mg total) by mouth daily.   metoprolol 100 MG tablet Commonly known as:  LOPRESSOR Take 100 mg by mouth 2 (two) times daily.   nitroGLYCERIN 0.4 MG SL tablet Commonly known as:  NITROSTAT Place 0.4 mg under the tongue every 5 (five) minutes as needed for chest pain.   olopatadine 0.1 % ophthalmic solution Commonly known as:  PATANOL Place 1 drop into both eyes 2 (two) times daily. As needed for itching/irritation.   OXYGEN Inhale 2 L/min into the lungs as needed.   polyethylene glycol packet Commonly known as:  MIRALAX / GLYCOLAX Take 17 g by mouth daily.   polyvinyl alcohol 1.4 % ophthalmic solution Commonly known as:  LIQUIFILM TEARS Place 1 drop into both eyes. One drop in both eyes twice daily as needed for dry eyes   protein supplement Powd Take 1 scoop by mouth daily as needed.   spironolactone 25 MG tablet Commonly known as:  ALDACTONE Take 25 mg by mouth daily.   tamsulosin 0.4 MG Caps capsule Commonly known as:  FLOMAX Take one cap by mouth daily   tobramycin-dexamethasone ophthalmic ointment Commonly known as:  TOBRADEX Place 1 application into both eyes 2 (two) times daily.   Vitamin D 400 units capsule Take 400 Units by mouth 2 (two) times daily.       Review of Systems  Constitutional: Negative for fever and unexpected weight change.       Generalized weakness  HENT: Positive for hearing loss and rhinorrhea. Negative for congestion, ear discharge, ear pain and tinnitus.   Eyes:       Chronnic irritation of the right  eye due to ectropion and failure of the lids to close during the day  Respiratory: Positive for cough and shortness of breath. Negative for wheezing.   Cardiovascular: Positive for leg swelling (sometimes, not apparent today. ). Negative for palpitations.       Trace edema BLE, R>L  Gastrointestinal: Negative for abdominal pain and diarrhea.  Chokes on swallowing since his CVA. Episodes of fecal incontinence. Hx of hemorrhoids.A tender Pilonidal cyst like growth at the right side of anus. Increased gas and fecal passage sometimes when his bladder moves.  Genitourinary: Positive for frequency and urgency.       Incontinent from time to time. Followed by Dr. Matilde Sprang.  Musculoskeletal: Positive for back pain. Negative for neck pain.  Skin: Negative for rash.       History of itching in the groin and chronic secondary to irritation from urinary incontinence, fecal incontinence, and adult diapering. Ulceration right medial malleolus since mid August 2017.  Neurological: Positive for weakness.       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 Recurrent CVA on 04/30/13 of the left MCA area Right facial weakness  Psychiatric/Behavioral: The patient is not nervous/anxious.     Immunization History  Administered Date(s) Administered  . Influenza Whole 12/06/2011, 12/19/2012  . Influenza-Unspecified 01/09/2014, 11/25/2014  . PPD Test 05/03/2013  . Pneumococcal Polysaccharide-23 03/07/1986  . Td 04/20/2005   Pertinent  Health Maintenance Due  Topic Date Due  . OPHTHALMOLOGY EXAM  01/12/1932  . PNA vac Low Risk Adult (1 of 2 - PCV13) 01/12/1989  . FOOT EXAM  07/31/2015  . INFLUENZA VACCINE  10/06/2015  . HEMOGLOBIN A1C  01/01/2016   Fall Risk  10/15/2015 06/11/2015 10/23/2014 07/07/2014 02/17/2014  Falls in the past year? No Yes Yes Yes Exclusion - non ambulatory  Number falls in past yr: - '1 1 1 ' -  Injury with Fall? - Yes - No -  Risk for fall  due to : - - - History of fall(s);Impaired mobility;Impaired balance/gait -  Follow up - - - Falls evaluation completed;Falls prevention discussed -   Functional Status Survey:    Vitals:   12/30/15 1018  BP: (!) 114/58  Pulse: 66  Resp: 18  Temp: 98 F (36.7 C)  SpO2: 98%  Weight: 150 lb 6.4 oz (68.2 kg)  Height: '5\' 8"'  (1.727 m)   Body mass index is 22.87 kg/m. Physical Exam  Constitutional: He is oriented to person, place, and time. He appears well-developed and well-nourished. No distress.  HENT:  Severe hearing loss. Bilateral aides. Right facial droop.   Eyes:  Corrective lenses. Increased exposure of the right cornea due to post CVA facial droop. Irregular pupil on the left.  Neck: No JVD present. No tracheal deviation present. No thyromegaly present.  Cardiovascular: Normal rate and normal heart sounds.  Frequent extrasystoles are present.  No murmur heard. Pacemaker left upper chest  Pulmonary/Chest: Effort normal. He has no wheezes. He has rales.  dry rales posterior lung bases.   Abdominal: Soft. Bowel sounds are normal. He exhibits no distension and no mass. There is no tenderness.  Genitourinary:  Genitourinary Comments: .A tender Pilonidal cyst like growth at the right side of anus  Musculoskeletal: Normal range of motion. He exhibits edema. He exhibits no tenderness.  Unstable gait. Using 4 wheel walker and w/c. Weaker on the right side. Hx of the right total knee replacement. Trace edema BLE, R>L. C/o left knee pain, f/u Ortho. Motorized w/c to go further  Lymphadenopathy:    He has no cervical adenopathy.  Neurological: He is alert and oriented to person, place, and time. He displays normal reflexes. No cranial nerve deficit. He exhibits normal muscle tone. Coordination normal.  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 Recurrent CVA on 04/30/13 of the left MCA  area Right facial weakness. Facial asymmetry is less  evident today.  Skin: Skin is warm and dry. No rash noted. No erythema. No pallor.  2 mm ulceration of the right medial malleolus. Slight surrounding erythema. Quite tender to touch. Shallow scab on top of it.  Psychiatric: He has a normal mood and affect. His behavior is normal. Judgment and thought content normal.  Nursing note and vitals reviewed.   Labs reviewed:  Recent Labs  06/16/15 07/02/15 07/09/15  NA 136* 132* 137  K 4.8 4.7 4.3  BUN 55* 50* 32*  CREATININE 1.4* 1.5* 1.1    Recent Labs  06/02/15 06/04/15 07/02/15  AST '17 18 27  ' ALT '11 11 25  ' ALKPHOS 103 94 100    Recent Labs  01/16/15 06/02/15  WBC 7.1 6.9  HGB 16.0 15.8  HCT 48 46  PLT 180 326   Lab Results  Component Value Date   TSH 3.19 02/19/2015   Lab Results  Component Value Date   HGBA1C 6.9 07/02/2015   Lab Results  Component Value Date   CHOL 117 05/01/2013   HDL 30 (L) 05/01/2013   LDLCALC 71 05/01/2013   TRIG 79 05/01/2013   CHOLHDL 3.9 05/01/2013    Significant Diagnostic Results in last 30 days:  No results found.  Assessment/Plan Atrial fibrillation Rate controlled, continue Metoprolol 135m bid. Eliquis for VTE risk reduction.    HTN (hypertension) Controlled, continue Metoprolol 100 mg twice a day, Losartan 534m Furosemide 4031md, Spironolactone 76m38mpdate CBC, CMP, BNP  Diabetes mellitus, type 2, with circulatory disorder (HCC) Diet controlled. Update Hgb a1c  Depression due to stroke Stable, Continue Lorazepam 0.76mg35m  Chronic systolic congestive heart failure (HCC) Lorettopensated clinically, continue Furosemide 40mg 42mprn Furosemide. Spironolactone 76mg. 66mte CMP, BNP  GERD (gastroesophageal reflux disease) Stable, off acid reducer  Constipation Stable, continue Benefiber and daily MiraLax  Hypothyroidism 09/16/14 TSH 2.965 01/15/15 7.202 02/19/15 TSH 3.194 06/16/15 TSH 1.86 Continue Levothyroxine 176mcg U52me TSH  CKD stage 2 due to type 2  diabetes mellitus (HCC) Last Bun 32, creat 1.09 07/09/15, update CMP  Osteoarthritis of both knees Ambulates with walker, w/c to go further.   Enlarged prostate with lower urinary tract symptoms (LUTS) urinary frequency at night, continue Tamsulosin 0.4mg  Ede12m5/4/17 Na 137. K 4.3, Bun 32, creat 1.09. Continue Furosemide and Spironolactone. No apparent edema in BLE  Gout No flare ups. continue Allopurinol 50mg dail67mpdate uric acid.      Family/ staff Communication: continue AL for care assistance.   Labs/tests ordered: CBC, CMP, TSH, Hgb a1c, uric acid, BNP

## 2015-12-30 NOTE — Assessment & Plan Note (Signed)
07/09/15 Na 137. K 4.3, Bun 32, creat 1.09. Continue Furosemide and Spironolactone. No apparent edema in BLE

## 2015-12-30 NOTE — Assessment & Plan Note (Signed)
No flare ups. continue Allopurinol 50mg  daily. Update uric acid.

## 2015-12-30 NOTE — Assessment & Plan Note (Signed)
Rate controlled, continue Metoprolol 100mg  bid. Eliquis for VTE risk reduction.

## 2015-12-31 DIAGNOSIS — R0602 Shortness of breath: Secondary | ICD-10-CM | POA: Diagnosis not present

## 2015-12-31 DIAGNOSIS — I10 Essential (primary) hypertension: Secondary | ICD-10-CM | POA: Diagnosis not present

## 2015-12-31 DIAGNOSIS — Z79899 Other long term (current) drug therapy: Secondary | ICD-10-CM | POA: Diagnosis not present

## 2015-12-31 LAB — BASIC METABOLIC PANEL
BUN: 28 mg/dL — AB (ref 4–21)
CREATININE: 1.4 mg/dL — AB (ref <=1.3)
Glucose: 28 mg/dL
POTASSIUM: 4.6 mmol/L (ref 3.4–5.3)
Sodium: 135 mmol/L — AB (ref 137–147)

## 2015-12-31 LAB — CBC AND DIFFERENTIAL
HCT: 47 % (ref 41–53)
Hemoglobin: 16.2 g/dL (ref 13.5–17.5)
PLATELETS: 184 10*3/uL (ref 150–399)
WBC: 6.2 10^3/mL

## 2015-12-31 LAB — HEPATIC FUNCTION PANEL
ALK PHOS: 80 U/L (ref 25–125)
ALT: 13 U/L (ref 10–40)
AST: 20 U/L (ref 14–40)
Bilirubin, Total: 1 mg/dL

## 2015-12-31 LAB — HEMOGLOBIN A1C: HEMOGLOBIN A1C: 6.3

## 2015-12-31 LAB — TSH: TSH: 4.24 u[IU]/mL (ref <=5.90)

## 2016-01-06 ENCOUNTER — Encounter: Payer: Self-pay | Admitting: Nurse Practitioner

## 2016-01-06 ENCOUNTER — Other Ambulatory Visit: Payer: Self-pay | Admitting: *Deleted

## 2016-01-07 ENCOUNTER — Ambulatory Visit (INDEPENDENT_AMBULATORY_CARE_PROVIDER_SITE_OTHER): Payer: Medicare Other | Admitting: *Deleted

## 2016-01-07 ENCOUNTER — Telehealth: Payer: Self-pay | Admitting: Cardiology

## 2016-01-07 DIAGNOSIS — I495 Sick sinus syndrome: Secondary | ICD-10-CM

## 2016-01-07 NOTE — Telephone Encounter (Signed)
LMOVM reminding pt to send remote transmission.   

## 2016-01-07 NOTE — Progress Notes (Signed)
Remote pacemaker transmission.   

## 2016-01-13 ENCOUNTER — Encounter: Payer: Self-pay | Admitting: Cardiology

## 2016-01-21 ENCOUNTER — Encounter: Payer: Self-pay | Admitting: Nurse Practitioner

## 2016-01-21 ENCOUNTER — Non-Acute Institutional Stay: Payer: Medicare Other | Admitting: Nurse Practitioner

## 2016-01-21 DIAGNOSIS — K219 Gastro-esophageal reflux disease without esophagitis: Secondary | ICD-10-CM | POA: Diagnosis not present

## 2016-01-21 DIAGNOSIS — J0100 Acute maxillary sinusitis, unspecified: Secondary | ICD-10-CM | POA: Diagnosis not present

## 2016-01-21 DIAGNOSIS — IMO0002 Reserved for concepts with insufficient information to code with codable children: Secondary | ICD-10-CM

## 2016-01-21 DIAGNOSIS — E1122 Type 2 diabetes mellitus with diabetic chronic kidney disease: Secondary | ICD-10-CM | POA: Diagnosis not present

## 2016-01-21 DIAGNOSIS — I639 Cerebral infarction, unspecified: Secondary | ICD-10-CM

## 2016-01-21 DIAGNOSIS — F0631 Mood disorder due to known physiological condition with depressive features: Secondary | ICD-10-CM

## 2016-01-21 DIAGNOSIS — E039 Hypothyroidism, unspecified: Secondary | ICD-10-CM | POA: Diagnosis not present

## 2016-01-21 DIAGNOSIS — K5900 Constipation, unspecified: Secondary | ICD-10-CM

## 2016-01-21 DIAGNOSIS — I480 Paroxysmal atrial fibrillation: Secondary | ICD-10-CM

## 2016-01-21 DIAGNOSIS — M1000 Idiopathic gout, unspecified site: Secondary | ICD-10-CM | POA: Diagnosis not present

## 2016-01-21 DIAGNOSIS — I6340 Cerebral infarction due to embolism of unspecified cerebral artery: Secondary | ICD-10-CM

## 2016-01-21 DIAGNOSIS — J019 Acute sinusitis, unspecified: Secondary | ICD-10-CM | POA: Insufficient documentation

## 2016-01-21 DIAGNOSIS — N182 Chronic kidney disease, stage 2 (mild): Secondary | ICD-10-CM | POA: Diagnosis not present

## 2016-01-21 DIAGNOSIS — I5022 Chronic systolic (congestive) heart failure: Secondary | ICD-10-CM

## 2016-01-21 DIAGNOSIS — I10 Essential (primary) hypertension: Secondary | ICD-10-CM

## 2016-01-21 NOTE — Progress Notes (Signed)
Location:   [facility]   Place of Service:   clinic      Provider:  Mast, Manxie  NP  Jeanmarie Hubert, MD  Patient Care Team: [name], MD as PCP - General (Internal Medicine) [facility] Pixie Casino, MD as Consulting Physician (Cardiology) Ardis Hughs, MD as Attending Physician (Urology)  Extended Emergency Contact Information Primary Emergency Contact: [name] of [place] Phone: [phone] Relation: Daughter Secondary Emergency Contact: Eyvonne Mechanic [place] of [place] Phone: [phone] Mobile Phone: [phone] Relation: Daughter  Code Status: DNR Goals of care: Advanced Directive information Advanced Directives 01/21/2016  Does patient have an advance directive? Yes  Type of Paramedic of [place];Living will;Out of facility DNR (pink MOST or yellow form)  Does patient want to make changes to advanced directive? No - Patient declined  Copy of advanced directive(s) in chart? Yes  Pre-existing out of facility DNR order (yellow form or pink MOST form) -     Chief Complaint  Patient presents with  . Acute Visit    dizzy spells in the morning    HPI:  Pt is a 82 y.o. male seen today for medical management of chronic diseases.    Dizziness, facial pressure for 1-2 days, afebrile, denied cough or sputum production. C/o heartburn 2 days ago, no relieve after NTG, but resolved a day later.    Hx of hypothyroidism, takingLevothyroxine 127mg, TSH 1.86 06/16/15. CVA, CHF, HTN, anxiety, urinary frequency. CHF: less SOB, dry rales back of lungs, on Furosemide 440m Spironolactone 2536maily. BPH: no urinary retention, but dribbling, taking Tamsulosin 0.4mg68mily. Gout: no flare ups, taking Allopurinol 50mg40mib/late effect of CVA: heart rate is in control, left facial weakness, ambulates with walker, taking Eliquis   Past Medical History:  Diagnosis Date  . Abnormal PFTs 10/30/2012   Followed in Pulmonary clinic/ [facility]/ [place]  - PFT's 10/22/12 VC  55% no obst, DLCO 50%  - PFT's 12/20/2012 VC 83% and no obst, dLCO 55%  -11/08/2012  Walked RA x 3 laps @ 185 ft each stopped due to  End of study, not desat -11/08/12 esr 10    . Abnormality of gait 04/18/2013  . Acute on chronic systolic congestive heart failure, NYHA class 2 (HCC) [place]0/2008   BNP 376.9 05/28/13   . Adjustment disorder with mixed anxiety and depressed mood 04/18/2013   Post CVA depression and anxiety   . Allergy   . Anxiety   . Aortic aneurysm of unspecified site without mention of rupture 10/27/2011  . Arthritis   . Atherosclerosis of renal artery (HCC) [place]3/2008  . Atrial fibrillation (HCC) [place]28/2012    He was on coumadin until his stroke last December and was switched to apixaban 2.5mg d89my which he has been taking faithfully without reported side effects.    . Balance disorder 07/31/2014  . Basal cell carcinoma of skin of other and unspecified parts of face 12/24/2009  . Blood transfusion without reported diagnosis   . Cardiac pacemaker in situ- MDT 10/11 03/18/2010   Medtronic revo implant in October 2011. Severe sinus bradycardia in the 30s, but not truly pacemaker dependent   . Cataract   . CKD stage 2 due to type 2 diabetes mellitus (HCC) 4[place]/2016  . Coronary artery disease   . CVA - Rt brain stroke 12/14 02/18/2013   02/19/13 angiography CT head:  Diffuse atherosclerotic irregularity and plaque formation of the distal right common carotid artery and  proximal internal carotid artery without significant stenosis. Plaque ulceration is present and is a source of emboli. A small right thalamic CVA    . DM (diabetes mellitus), type 2 with renal complications ([place]) 1/61/0960  . Dyslipidemia 11/03/2004  . Fall 03/04/2011  . Fatigue 04/18/2013  . Gout, unspecified 11/03/2004  . Heart murmur   . History of abdominal aortic aneurysm   . History of Doppler ultrasound 05/22/2012   LEAs; R anterior tibial  artery appeared occluded; L posterior tibial shows short segment of occlusive ds  . History of Doppler ultrasound 05/22/2012   Abdominal Aortic Doppler; slight increase in fusiform aneurysm   . History of echocardiogram 12/2008   EF >55%; mild concentric LVH; mild MR; mild-mod TR; mild AV regurg;   . History of nuclear stress test 12/2010   lexiscan; low risk; compared to prior study, perfusion improved  . HTN (hypertension) 03/04/2011  . Hyperlipidemia   . Hypertension   . Hypertrophy of prostate with urinary obstruction and other lower urinary tract symptoms (LUTS) 04/28/2004  . Hyponatremia 03/04/2011  . Hypothyroidism 03/04/2011  . Impotence of organic origin 10/03/2000  . Internal hemorrhoids without mention of complication 45/40/9811  . Long term (current) use of anticoagulants 03/17/2011  . Major depressive disorder, single episode, unspecified 03/05/2009  . Memory change 01/24/2013  . Muscle weakness (generalized) 03/10/2011  . Nocturia 10/27/2011  . Obstructive sleep apnea (adult) (pediatric) 07/23/2008  . Open wound of knee, leg (except thigh), and ankle, complicated 11/19/7827  . Osteoarthritis of both knees 07/31/2014  . Osteoarthrosis, unspecified whether generalized or localized, unspecified site 02/05/2003  . PAF (paroxysmal atrial fibrillation) (HCC)    coumadin  . Pain in joint, lower leg 08/04/2011  . Pruritic condition 01/24/2013  . Quadriceps weakness 07/31/2014  . Right bundle branch block 04/19/2006  . 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 Spinal stenosis in cervical region 12/15/2004  . SSS (sick sinus syndrome) ([place]) 04/08/2014  . Stroke ([place])   . Thyroid disease   . TIA (transient ischemic attack) 04/30/2013  . Type II or unspecified type diabetes mellitus without mention of complication, not stated as uncontrolled 11/24/2004  . Unspecified vitamin D deficiency 10/18/2006  . Xerophthalmia 02/25/2013   Droop of the right  lower eyelid. Increased exposure of the right cornea.    Past Surgical History:  Procedure Laterality Date  . CORONARY ANGIOPLASTY WITH STENT PLACEMENT  2008   stent to SVG to OM  . CORONARY ARTERY BYPASS GRAFT  1989  . EYE SURGERY    . HERNIA REPAIR    . INSERT / REPLACE / REMOVE PACEMAKER    . JOINT REPLACEMENT    . PACEMAKER INSERTION  2011    Allergies  Allergen Reactions  . Quinolones Other (See Comments)    Risk of rupture of AAA  . Caduet [Amlodipine-Atorvastatin] Other (See Comments)    Weakness   . Crestor [Rosuvastatin] Other (See Comments)    Muscle pain/weakness  . Lipitor [Atorvastatin Calcium] Other (See Comments)    Muscle pain/weakness  . Simvastatin Other (See Comments)    Muscle pain/weakness  . Lisinopril Other (See Comments)    Doesn't remember reaction      Medication List       Accurate as of 01/21/16  5:34 PM. Always use your most recent med list.          acetaminophen 325 MG tablet Commonly known as:  TYLENOL Take  325 mg by mouth daily.   allopurinol 100 MG tablet Commonly known as:  ZYLOPRIM Take 50 mg by mouth daily.   amoxicillin 500 MG capsule Commonly known as:  AMOXIL Take 500 mg by mouth. Take 4 caps prior to dental procedures   apixaban 2.5 MG Tabs tablet Commonly known as:  ELIQUIS Take 1 tablet (2.5 mg total) by mouth 2 (two) times daily.   aspirin EC 81 MG tablet Take 81 mg by mouth daily.   BOOST COMPACT Liqd Take 1 Container by mouth daily. 237 mls daily   carbamide peroxide 6.5 % otic solution Commonly known as:  DEBROX Place 5 drops of medication into affected ear at night for 3 days, then irrigate with bulb syringe.   carboxymethylcellulose 0.5 % Soln Commonly known as:  REFRESH PLUS Place 1 drop into both eyes every evening.   furosemide 40 MG tablet Commonly known as:  LASIX Take 40 mg by mouth daily. Take one tablet as needed if weight gain 3  Lbs in 24 hours or 5 lbs in a week   hydrocortisone 1 %  Crea Commonly known as:  PROCTOCORT Apply twice daily as needed to relieve hemorrhoid discomfort   ipratropium-albuterol 0.5-2.5 (3) MG/3ML Soln Commonly known as:  DUONEB Take 3 mLs by nebulization 4 (four) times daily as needed.   levothyroxine 125 MCG tablet Commonly known as:  SYNTHROID, LEVOTHROID Take 125 mcg by mouth daily before breakfast.   LORazepam 0.5 MG tablet Commonly known as:  ATIVAN Take 0.25 mg by mouth at bedtime. Take 1 tab by mouth every four hours as needed   losartan 25 MG tablet Commonly known as:  COZAAR Take 1 tablet (25 mg total) by mouth daily.   metoprolol 100 MG tablet Commonly known as:  LOPRESSOR Take 100 mg by mouth 2 (two) times daily.   nitroGLYCERIN 0.4 MG SL tablet Commonly known as:  NITROSTAT Place 0.4 mg under the tongue every 5 (five) minutes as needed for chest pain.   olopatadine 0.1 % ophthalmic solution Commonly known as:  PATANOL Place 1 drop into both eyes 2 (two) times daily. As needed for itching/irritation.   OXYGEN Inhale 2 L/min into the lungs as needed.   polyethylene glycol packet Commonly known as:  MIRALAX / GLYCOLAX Take 17 g by mouth daily.   polyvinyl alcohol 1.4 % ophthalmic solution Commonly known as:  LIQUIFILM TEARS Place 1 drop into both eyes. One drop in both eyes twice daily as needed for dry eyes   protein supplement Powd Take 1 scoop by mouth daily as needed.   spironolactone 25 MG tablet Commonly known as:  ALDACTONE Take 25 mg by mouth daily.   tamsulosin 0.4 MG Caps capsule Commonly known as:  FLOMAX Take one cap by mouth daily   tobramycin-dexamethasone ophthalmic ointment Commonly known as:  TOBRADEX Place 1 application into both eyes 2 (two) times daily.   Vitamin D 400 units capsule Take 400 Units by mouth 2 (two) times daily.       Review of Systems  Constitutional: Negative for fever and unexpected weight change.       Generalized weakness  HENT: Positive for hearing loss  and rhinorrhea. Negative for congestion, ear discharge, ear pain and tinnitus.   Eyes:       Chronnic irritation of the right eye due to ectropion and failure of the lids to close during the day  Respiratory: Positive for cough and shortness of breath. Negative for wheezing.  Cardiovascular: Positive for leg swelling (sometimes, not apparent today. ). Negative for palpitations.       Trace edema BLE, R>L  Gastrointestinal: Negative for abdominal pain and diarrhea.       Chokes on swallowing since his CVA. Episodes of fecal incontinence. Hx of hemorrhoids.A tender Pilonidal cyst like growth at the right side of anus. Increased gas and fecal passage sometimes when his bladder moves.  Genitourinary: Positive for frequency and urgency.       Incontinent from time to time. Followed by Dr. Matilde Sprang.  Musculoskeletal: Positive for back pain. Negative for neck pain.  Skin: Negative for rash.       History of itching in the groin and chronic secondary to irritation from urinary incontinence, fecal incontinence, and adult diapering. Ulceration right medial malleolus since mid August 2017.  Neurological: Positive for weakness.       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 Recurrent CVA on 04/30/13 of the left MCA area Right facial weakness  Psychiatric/Behavioral: The patient is not nervous/anxious.     Immunization History  Administered Date(s) Administered  . Influenza Whole 12/06/2011, 12/19/2012  . Influenza-Unspecified 01/09/2014, 11/25/2014, 12/23/2015  . PPD Test 05/03/2013  . Pneumococcal Polysaccharide-23 03/07/1986  . Td 04/20/2005   Pertinent  Health Maintenance Due  Topic Date Due  . OPHTHALMOLOGY EXAM  01/12/1932  . PNA vac Low Risk Adult (1 of 2 - PCV13) 01/12/1989  . FOOT EXAM  07/31/2015  . HEMOGLOBIN A1C  06/30/2016  . INFLUENZA VACCINE  Completed   Fall Risk  10/15/2015 06/11/2015 10/23/2014 07/07/2014 02/17/2014  Falls in the  past year? No Yes Yes Yes Exclusion - non ambulatory  Number falls in past yr: - _0 -  Injury with Fall? - Yes - No -  Risk for fall due to : - - - History of fall(s);Impaired mobility;Impaired balance/gait -  Follow up - - - Falls evaluation completed;Falls prevention discussed -   Functional Status Survey:    Vitals:   01/21/16 1652  BP: 122/62  Pulse: 61  Resp: 20  Temp: 98.1 F (36.7 C)  SpO2: 94%  Weight: 160 lb (72.6 kg)  Height: _1  (1.727 m)   Body mass index is 24.33 kg/m. Physical Exam  Constitutional: He is oriented to person, place, and time. He appears well-developed and well-nourished. No distress.  HENT:  Severe hearing loss. Bilateral aides. Right facial droop.   Eyes:  Corrective lenses. Increased exposure of the right cornea due to post CVA facial droop. Irregular pupil on the left.  Neck: No JVD present. No tracheal deviation present. No thyromegaly present.  Cardiovascular: Normal rate and normal heart sounds.  Frequent extrasystoles are present.  No murmur heard. Pacemaker left upper chest  Pulmonary/Chest: Effort normal. He has no wheezes. He has rales.  dry rales posterior lung bases.   Abdominal: Soft. Bowel sounds are normal. He exhibits no distension and no mass. There is no tenderness.  Genitourinary:  Genitourinary Comments: .A tender Pilonidal cyst like growth at the right side of anus  Musculoskeletal: Normal range of motion. He exhibits edema. He exhibits no tenderness.  Unstable gait. Using 4 wheel walker and w/c. Weaker on the right side. Hx of the right total knee replacement. Trace edema BLE, R>L. C/o left knee pain, f/u Ortho. Motorized w/c to go further  Lymphadenopathy:    He has no cervical adenopathy.  Neurological: He is alert and oriented to person, place, and time.  He displays normal reflexes. No cranial nerve deficit. He exhibits normal muscle tone. Coordination normal.  Thalamic CVA 02/18/2013. Residual right side weakness,  slurred speech, and depression. Recurrent CVA on 04/30/13 of the left MCA area Right facial weakness. Facial asymmetry is less evident today.  Skin: Skin is warm and dry. No rash noted. No erythema. No pallor.  2 mm ulceration of the right medial malleolus. Slight surrounding erythema. Quite tender to touch. Shallow scab on top of it.  Psychiatric: He has a normal mood and affect. His behavior is normal. Judgment and thought content normal.  Nursing note and vitals reviewed.   Labs reviewed:  Recent Labs  07/02/15 07/09/15 12/31/15  NA 132* 137 135*  K 4.7 4.3 4.6  BUN 50* 32* 28*  CREATININE 1.5* 1.1 1.4*    Recent Labs  06/04/15 07/02/15 12/31/15  AST _0 ALT _1 ALKPHOS 94 100 80    Recent Labs  06/02/15 12/31/15  WBC 6.9 6.2  HGB 15.8 16.2  HCT 46 47  PLT 326 184   Lab Results  Component Value Date   TSH 4.24 12/31/2015   Lab Results  Component Value Date   HGBA1C 6.3 12/31/2015   Lab Results  Component Value Date   CHOL 117 05/01/2013   HDL 30 (L) 05/01/2013   LDLCALC 71 05/01/2013   TRIG 79 05/01/2013   CHOLHDL 3.9 05/01/2013    Significant Diagnostic Results in last 30 days:  No results found.  Assessment/Plan Sinusitis, acute C/o dizziness 1-2 days, on and off, facial pressure, afebrile, no phlegm production, but he did mention his resolved heartburn 2 days ago, NGT didn't help, resolved on w/o intervention. TIA vs sinusitis vs ? Dehydration vs PNA. Augmentin 892m bid x 7 days. CBC CMP CXR if no better.   Atrial fibrillation (HCC) Rate controlled, continue Metoprolol 1045mbid. Eliquis for VTE risk reduction.    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 Spironolactone 25mg. 142m/17 wbc 6.2, Hgb 16.2, plt 184, Na 135, K 4.6, Bun 28, creat 1.43, TP 6.9, albumin 4.1, uric acid 8.2, TSH 4.24, Hgb a1c 6.3, BNP 254.1  GERD (gastroesophageal reflux disease) Stable, off acid reducer  Constipation Stable, continue Benefiber and daily MiraLax  Hypothyroidism 02/19/15 TSH 3.194 06/16/15 TSH 1.86 12/31/15 TSH 4.24 Continue Levothyroxine 125mcg  C71mtage 2 due to type 2 diabetes mellitus (HCC) 10/2[place]7 wbc 6.2, Hgb 16.2, plt 184, Na 135, K 4.6, Bun 28, creat 1.43, TP 6.9, albumin 4.1, uric acid 8.2, TSH 4.24, Hgb a1c 6.3, BNP 254.1  Gout No flare ups. continue Allopurinol 50mg dail78m    Family/ staff Communication: continue AL for care assistance.   Labs/tests ordered: CBC CMP CXR(AP) if no better.

## 2016-01-21 NOTE — Assessment & Plan Note (Signed)
12/31/15 wbc 6.2, Hgb 16.2, plt 184, Na 135, K 4.6, Bun 28, creat 1.43, TP 6.9, albumin 4.1, uric acid 8.2, TSH 4.24, Hgb a1c 6.3, BNP 254.1

## 2016-01-21 NOTE — Assessment & Plan Note (Addendum)
Controlled, continue Metoprolol 100 mg twice a day, Losartan 50mg , Furosemide 40mg  qd, Spironolactone 25mg . 12/31/15 wbc 6.2, Hgb 16.2, plt 184, Na 135, K 4.6, Bun 28, creat 1.43, TP 6.9, albumin 4.1, uric acid 8.2, TSH 4.24, Hgb a1c 6.3, BNP 254.1

## 2016-01-21 NOTE — Assessment & Plan Note (Signed)
Rate controlled, continue Metoprolol 100mg  bid. Eliquis for VTE risk reduction.

## 2016-01-21 NOTE — Assessment & Plan Note (Addendum)
No flare ups. continue Allopurinol 50mg daily.  

## 2016-01-21 NOTE — Assessment & Plan Note (Signed)
02/19/15 TSH 3.194 06/16/15 TSH 1.86 12/31/15 TSH 4.24 Continue Levothyroxine 172mcg

## 2016-01-21 NOTE — Assessment & Plan Note (Signed)
Stable, off acid reducer 

## 2016-01-21 NOTE — Assessment & Plan Note (Signed)
Stable, Continue Lorazepam 0.25mg  qhs

## 2016-01-21 NOTE — Assessment & Plan Note (Signed)
Stable, continue Benefiber and daily MiraLax

## 2016-01-21 NOTE — Assessment & Plan Note (Addendum)
compensated clinically, continue Furosemide 40mg  qd, prn Furosemide. Spironolactone 25mg . 12/31/15 wbc 6.2, Hgb 16.2, plt 184, Na 135, K 4.6, Bun 28, creat 1.43, TP 6.9, albumin 4.1, uric acid 8.2, TSH 4.24, Hgb a1c 6.3, BNP 254.1

## 2016-01-21 NOTE — Assessment & Plan Note (Addendum)
C/o dizziness 1-2 days, on and off, facial pressure, afebrile, no phlegm production, but he did mention his resolved heartburn 2 days ago, NGT didn't help, resolved on w/o intervention. TIA vs sinusitis vs ? Dehydration vs PNA. Augmentin 875mg  bid x 7 days. CBC CMP CXR if no better.

## 2016-01-26 DIAGNOSIS — I10 Essential (primary) hypertension: Secondary | ICD-10-CM | POA: Diagnosis not present

## 2016-01-26 DIAGNOSIS — R6889 Other general symptoms and signs: Secondary | ICD-10-CM | POA: Diagnosis not present

## 2016-01-26 LAB — CBC AND DIFFERENTIAL
HEMATOCRIT: 47 % (ref 41–53)
HEMOGLOBIN: 16.1 g/dL (ref 13.5–17.5)
Platelets: 171 10*3/uL (ref 150–399)
WBC: 6.8 10*3/mL

## 2016-01-26 LAB — HEPATIC FUNCTION PANEL
ALT: 17 U/L (ref 10–40)
AST: 25 U/L (ref 14–40)
Alkaline Phosphatase: 74 U/L (ref 25–125)
BILIRUBIN, TOTAL: 1.2 mg/dL

## 2016-01-26 LAB — BASIC METABOLIC PANEL
BUN: 26 mg/dL — AB (ref 4–21)
Creatinine: 1.2 mg/dL (ref <=1.3)
GLUCOSE: 112 mg/dL
Potassium: 4.6 mmol/L (ref 3.4–5.3)
Sodium: 135 mmol/L — AB (ref 137–147)

## 2016-01-27 ENCOUNTER — Non-Acute Institutional Stay: Payer: Medicare Other | Admitting: Nurse Practitioner

## 2016-01-27 ENCOUNTER — Encounter: Payer: Self-pay | Admitting: Nurse Practitioner

## 2016-01-27 DIAGNOSIS — I10 Essential (primary) hypertension: Secondary | ICD-10-CM | POA: Diagnosis not present

## 2016-01-27 DIAGNOSIS — E1122 Type 2 diabetes mellitus with diabetic chronic kidney disease: Secondary | ICD-10-CM

## 2016-01-27 DIAGNOSIS — I480 Paroxysmal atrial fibrillation: Secondary | ICD-10-CM | POA: Diagnosis not present

## 2016-01-27 DIAGNOSIS — K5900 Constipation, unspecified: Secondary | ICD-10-CM

## 2016-01-27 DIAGNOSIS — K219 Gastro-esophageal reflux disease without esophagitis: Secondary | ICD-10-CM

## 2016-01-27 DIAGNOSIS — F0631 Mood disorder due to known physiological condition with depressive features: Secondary | ICD-10-CM | POA: Diagnosis not present

## 2016-01-27 DIAGNOSIS — I5022 Chronic systolic (congestive) heart failure: Secondary | ICD-10-CM | POA: Diagnosis not present

## 2016-01-27 DIAGNOSIS — I639 Cerebral infarction, unspecified: Secondary | ICD-10-CM | POA: Diagnosis not present

## 2016-01-27 DIAGNOSIS — IMO0002 Reserved for concepts with insufficient information to code with codable children: Secondary | ICD-10-CM

## 2016-01-27 DIAGNOSIS — N182 Chronic kidney disease, stage 2 (mild): Secondary | ICD-10-CM | POA: Diagnosis not present

## 2016-01-27 DIAGNOSIS — R609 Edema, unspecified: Secondary | ICD-10-CM

## 2016-01-27 DIAGNOSIS — E039 Hypothyroidism, unspecified: Secondary | ICD-10-CM | POA: Diagnosis not present

## 2016-01-27 DIAGNOSIS — R55 Syncope and collapse: Secondary | ICD-10-CM

## 2016-01-27 NOTE — Assessment & Plan Note (Signed)
Stable, Continue Lorazepam 0.25mg  qhs

## 2016-01-27 NOTE — Assessment & Plan Note (Signed)
06/16/15 TSH 1.86 12/31/15 TSH 4.24 Continue Levothyroxine 127mcg

## 2016-01-27 NOTE — Assessment & Plan Note (Signed)
Rate controlled, continue Metoprolol 100mg  bid. Eliquis for VTE risk reduction.

## 2016-01-27 NOTE — Assessment & Plan Note (Signed)
Controlled, continue Metoprolol 100 mg twice a day, Losartan 50mg , Furosemide 40mg  qd, Spironolactone 25mg . 12/31/15 wbc 6.2, Hgb 16.2, plt 184, Na 135, K 4.6, Bun 28, creat 1.43, TP 6.9, albumin 4.1, uric acid 8.2, TSH 4.24, Hgb a1c 6.3, BNP 254.1

## 2016-01-27 NOTE — Assessment & Plan Note (Signed)
Stable, continue Benefiber and daily MiraLax

## 2016-01-27 NOTE — Progress Notes (Signed)
Location:  [facility] Room Number: 902 Place of Service:  ALF (13) Provider:  , Manxie NP  Arthur Green, MD  Patient Care Team: Arthur G Green, MD as PCP - General (Internal Medicine) [facility] Kenneth C Hilty, MD as Consulting Physician (Cardiology) Benjamin W Herrick, MD as Attending Physician (Urology)  Extended Emergency Contact Information Primary Emergency Contact: Praire,Nancy  [place] Mobile Phone: [phone] Relation: Daughter Secondary Emergency Contact: Motsinger,Margie  [place] Home Phone: [phone] Mobile Phone: [phone] Relation: Daughter  Code Status:  Full Code Goals of care: Advanced Directive information Advanced Directives 01/27/2016  Does Patient Have a Medical Advance Directive? Yes  Type of Advance Directive Healthcare Power of Attorney;Living will;Out of facility DNR (pink MOST or yellow form)  Does patient want to make changes to medical advance directive? No - Patient declined  Copy of Healthcare Power of Attorney in Chart? Yes  Pre-existing out of facility DNR order (yellow form or pink MOST form) -     Chief Complaint  Patient presents with  . Acute Visit    Having dizzy episodes, daughter request that he be seen    HPI:  Pt is a 82 y.o. male seen today for an acute visit for c/o persisted dizziness, no new focal neurological symptoms noted. Denied chest pain, worsened SOB, palpitation.    Dizziness, facial pressure, afebrile, denied cough or sputum production. Completed empirical ABT, no change in condition.              Hx of hypothyroidism, takingLevothyroxine 125mcg, TSH 1.86 06/16/15. CVA, CHF, HTN, anxiety, urinary frequency. CHF: less SOB, dry rales back of lungs, on Furosemide 40mg, Spironolactone 25mg daily. BPH: no urinary retention, but dribbling, taking Tamsulosin 0.4mg daily. Gout: no flare ups, taking Allopurinol 50mg. Afib/late effect of CVA: heart  rate is in control, left facial weakness, ambulates with walker, taking Eliquis   Past Medical History:  Diagnosis Date  . Abnormal PFTs 10/30/2012   Followed in Pulmonary clinic/  Healthcare/ [facility]  - PFT's 10/22/12 VC  55% no obst, DLCO 50%  - PFT's 12/20/2012 VC 83% and no obst, dLCO 55%  -11/08/2012  Walked RA x 3 laps @ 185 ft each stopped due to  End of study, not desat -11/08/12 esr 10    . Abnormality of gait 04/18/2013  . Acute on chronic systolic congestive heart failure, NYHA class 2 (HCC) 03/16/2006   BNP 376.9 05/28/13   . Adjustment disorder with mixed anxiety and depressed mood 04/18/2013   Post CVA depression and anxiety   . Allergy   . Anxiety   . Aortic aneurysm of unspecified site without mention of rupture 10/27/2011  . Arthritis   . Atherosclerosis of renal artery (HCC) 10/18/2006  . Atrial fibrillation (HCC) 03/04/2011    He was on coumadin until his stroke last December and was switched to apixaban 2.5mg daily which he has been taking faithfully without reported side effects.    . Balance disorder 07/31/2014  . Basal cell carcinoma of skin of other and unspecified parts of face 12/24/2009  . Blood transfusion without reported diagnosis   . Cardiac pacemaker in situ- MDT 10/11 03/18/2010   Medtronic revo implant in October 2011. Severe sinus bradycardia in the 30s, but not truly pacemaker dependent   . Cataract   . CKD stage 2 due to type 2 diabetes mellitus (HCC) 07/03/2014  . Coronary artery disease   . CVA - Rt brain stroke   12/14 02/18/2013   02/19/13 angiography CT head:  Diffuse atherosclerotic irregularity and plaque formation of the distal right common carotid artery and proximal internal carotid artery without significant stenosis. Plaque ulceration is present and is a source of emboli. A small right thalamic CVA    . DM (diabetes mellitus), type 2 with renal complications ([place]) 6/33/3545  . Dyslipidemia 11/03/2004  . Fall 03/04/2011  . Fatigue 04/18/2013  . Gout,  unspecified 11/03/2004  . Heart murmur   . History of abdominal aortic aneurysm   . History of Doppler ultrasound 05/22/2012   LEAs; R anterior tibial artery appeared occluded; L posterior tibial shows short segment of occlusive ds  . History of Doppler ultrasound 05/22/2012   Abdominal Aortic Doppler; slight increase in fusiform aneurysm   . History of echocardiogram 12/2008   EF >55%; mild concentric LVH; mild MR; mild-mod TR; mild AV regurg;   . History of nuclear stress test 12/2010   lexiscan; low risk; compared to prior study, perfusion improved  . HTN (hypertension) 03/04/2011  . Hyperlipidemia   . Hypertension   . Hypertrophy of prostate with urinary obstruction and other lower urinary tract symptoms (LUTS) 04/28/2004  . Hyponatremia 03/04/2011  . Hypothyroidism 03/04/2011  . Impotence of organic origin 10/03/2000  . Internal hemorrhoids without mention of complication 62/56/3893  . Long term (current) use of anticoagulants 03/17/2011  . Major depressive disorder, single episode, unspecified 03/05/2009  . Memory change 01/24/2013  . Muscle weakness (generalized) 03/10/2011  . Nocturia 10/27/2011  . Obstructive sleep apnea (adult) (pediatric) 07/23/2008  . Open wound of knee, leg (except thigh), and ankle, complicated 7/34/2876  . Osteoarthritis of both knees 07/31/2014  . Osteoarthrosis, unspecified whether generalized or localized, unspecified site 02/05/2003  . PAF (paroxysmal atrial fibrillation) (HCC)    coumadin  . Pain in joint, lower leg 08/04/2011  . Pruritic condition 01/24/2013  . Quadriceps weakness 07/31/2014  . Right bundle branch block 04/19/2006  . 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 Spinal stenosis in cervical region 12/15/2004  . SSS (sick sinus syndrome) ([place]) 04/08/2014  . Stroke ([place])   . Thyroid disease   . TIA (transient ischemic attack) 04/30/2013  . Type II or unspecified type diabetes mellitus without  mention of complication, not stated as uncontrolled 11/24/2004  . Unspecified vitamin D deficiency 10/18/2006  . 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 Increased exposure of the right cornea.    Past Surgical History:  Procedure Laterality Date  . CORONARY ANGIOPLASTY WITH STENT PLACEMENT  2008   stent to SVG to OM  . CORONARY ARTERY BYPASS GRAFT  1989  . EYE SURGERY    . HERNIA REPAIR    . INSERT / REPLACE / REMOVE PACEMAKER    . JOINT REPLACEMENT    . PACEMAKER INSERTION  2011    Allergies  Allergen Reactions  . Quinolones Other (See Comments)    Risk of rupture of AAA  . Caduet [Amlodipine-Atorvastatin] Other (See Comments)    Weakness   . Crestor [Rosuvastatin] Other (See Comments)    Muscle pain/weakness  . Lipitor [Atorvastatin Calcium] Other (See Comments)    Muscle pain/weakness  . Simvastatin Other (See Comments)    Muscle pain/weakness  . Lisinopril Other (See Comments)    Doesn't remember reaction      Medication List       Accurate as of 01/27/16  4:08 PM. Always use your  most recent med list.          acetaminophen 325 MG tablet Commonly known as:  TYLENOL Take 325 mg by mouth daily.   allopurinol 100 MG tablet Commonly known as:  ZYLOPRIM Take 50 mg by mouth daily.   amoxicillin 500 MG capsule Commonly known as:  AMOXIL Take 500 mg by mouth. Take 4 caps prior to dental procedures   apixaban 2.5 MG Tabs tablet Commonly known as:  ELIQUIS Take 1 tablet (2.5 mg total) by mouth 2 (two) times daily.   aspirin EC 81 MG tablet Take 81 mg by mouth daily.   BOOST COMPACT Liqd Take 1 Container by mouth daily. 237 mls daily   carbamide peroxide 6.5 % otic solution Commonly known as:  DEBROX Place 5 drops of medication into affected ear at night for 3 days, then irrigate with bulb syringe.   carboxymethylcellulose 0.5 % Soln Commonly known as:  REFRESH PLUS Place 1 drop into both eyes every evening.   furosemide 40 MG  tablet Commonly known as:  LASIX Take 40 mg by mouth daily. Take one tablet as needed if weight gain 3  Lbs in 24 hours or 5 lbs in a week   hydrocortisone 1 % Crea Commonly known as:  PROCTOCORT Apply twice daily as needed to relieve hemorrhoid discomfort   ipratropium-albuterol 0.5-2.5 (3) MG/3ML Soln Commonly known as:  DUONEB Take 3 mLs by nebulization 4 (four) times daily as needed.   levothyroxine 125 MCG tablet Commonly known as:  SYNTHROID, LEVOTHROID Take 125 mcg by mouth daily before breakfast.   LORazepam 0.5 MG tablet Commonly known as:  ATIVAN Take 0.25 mg by mouth at bedtime. Take 1 tab by mouth every four hours as needed   losartan 25 MG tablet Commonly known as:  COZAAR Take 1 tablet (25 mg total) by mouth daily.   metoprolol 100 MG tablet Commonly known as:  LOPRESSOR Take 100 mg by mouth 2 (two) times daily.   nitroGLYCERIN 0.4 MG SL tablet Commonly known as:  NITROSTAT Place 0.4 mg under the tongue every 5 (five) minutes as needed for chest pain.   olopatadine 0.1 % ophthalmic solution Commonly known as:  PATANOL Place 1 drop into both eyes 2 (two) times daily. As needed for itching/irritation.   OXYGEN Inhale 2 L/min into the lungs as needed.   polyethylene glycol packet Commonly known as:  MIRALAX / GLYCOLAX Take 17 g by mouth daily.   polyvinyl alcohol 1.4 % ophthalmic solution Commonly known as:  LIQUIFILM TEARS Place 1 drop into both eyes. One drop in both eyes twice daily as needed for dry eyes   protein supplement Powd Take 1 scoop by mouth daily as needed.   spironolactone 25 MG tablet Commonly known as:  ALDACTONE Take 25 mg by mouth daily.   tamsulosin 0.4 MG Caps capsule Commonly known as:  FLOMAX Take one cap by mouth daily   tobramycin-dexamethasone ophthalmic ointment Commonly known as:  TOBRADEX Place 1 application into both eyes 2 (two) times daily.   Vitamin D 400 units capsule Take 400 Units by mouth 2 (two) times  daily.       Review of Systems  Constitutional: Negative for fever and unexpected weight change.       Generalized weakness  HENT: Positive for hearing loss and rhinorrhea. Negative for congestion, ear discharge, ear pain and tinnitus.   Eyes:       Chronnic irritation of the right eye due to ectropion and   failure of the lids to close during the day  Respiratory: Positive for cough and shortness of breath. Negative for wheezing.   Cardiovascular: Positive for leg swelling (sometimes, not apparent today. ). Negative for palpitations.       Trace edema BLE, R>L  Gastrointestinal: Negative for abdominal pain and diarrhea.       Chokes on swallowing since his CVA. Episodes of fecal incontinence. Hx of hemorrhoids.A tender Pilonidal cyst like growth at the right side of anus. Increased gas and fecal passage sometimes when his bladder moves.  Genitourinary: Positive for frequency and urgency.       Incontinent from time to time. Followed by Dr. MacDiarmid.  Musculoskeletal: Positive for back pain. Negative for neck pain.  Skin: Negative for rash.       History of itching in the groin and chronic secondary to irritation from urinary incontinence, fecal incontinence, and adult diapering. Ulceration right medial malleolus since mid August 2017.  Neurological: Positive for weakness.       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 Recurrent CVA on 04/30/13 of the left MCA area Right facial weakness  Psychiatric/Behavioral: The patient is not nervous/anxious.     Immunization History  Administered Date(s) Administered  . Influenza Whole 12/06/2011, 12/19/2012  . Influenza-Unspecified 01/09/2014, 11/25/2014, 12/23/2015  . PPD Test 05/03/2013  . Pneumococcal Polysaccharide-23 03/07/1986  . Td 04/20/2005   Pertinent  Health Maintenance Due  Topic Date Due  . OPHTHALMOLOGY EXAM  01/12/1932  . PNA vac Low Risk Adult (1 of 2 - PCV13) 01/12/1989  .  FOOT EXAM  07/31/2015  . HEMOGLOBIN A1C  06/30/2016  . INFLUENZA VACCINE  Completed   Fall Risk  10/15/2015 06/11/2015 10/23/2014 07/07/2014 02/17/2014  Falls in the past year? No Yes Yes Yes Exclusion - non ambulatory  Number falls in past yr: - 1 1 1 -  Injury with Fall? - Yes - No -  Risk for fall due to : - - - History of fall(s);Impaired mobility;Impaired balance/gait -  Follow up - - - Falls evaluation completed;Falls prevention discussed -   Functional Status Survey:    Vitals:   01/27/16 1404  BP: 120/64  Pulse: 61  Resp: 20  Temp: 98.1 F (36.7 C)  SpO2: 93%  Weight: 160 lb (72.6 kg)  Height: 5' 8" (1.727 m)   Body mass index is 24.33 kg/m. Physical Exam  Constitutional: He is oriented to person, place, and time. He appears well-developed and well-nourished. No distress.  HENT:  Severe hearing loss. Bilateral aides. Right facial droop.   Eyes:  Corrective lenses. Increased exposure of the right cornea due to post CVA facial droop. Irregular pupil on the left.  Neck: No JVD present. No tracheal deviation present. No thyromegaly present.  Cardiovascular: Normal rate and normal heart sounds.  Frequent extrasystoles are present.  No murmur heard. Pacemaker left upper chest  Pulmonary/Chest: Effort normal. He has no wheezes. He has rales.  dry rales posterior lung bases.   Abdominal: Soft. Bowel sounds are normal. He exhibits no distension and no mass. There is no tenderness.  Genitourinary:  Genitourinary Comments: .A tender Pilonidal cyst like growth at the right side of anus  Musculoskeletal: Normal range of motion. He exhibits edema. He exhibits no tenderness.  Unstable gait. Using 4 wheel walker and w/c. Weaker on the right side. Hx of the right total knee replacement. Trace edema BLE, R>L. C/o left knee pain, f/u Ortho. Motorized w/c to go   further  Lymphadenopathy:    He has no cervical adenopathy.  Neurological: He is alert and oriented to person, place, and time.  He displays normal reflexes. No cranial nerve deficit. He exhibits normal muscle tone. Coordination normal.  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 Recurrent CVA on 04/30/13 of the left MCA area Right facial weakness. Facial asymmetry is less evident today.  Skin: Skin is warm and dry. No rash noted. No erythema. No pallor.  2 mm ulceration of the right medial malleolus. Slight surrounding erythema. Quite tender to touch. Shallow scab on top of it.  Psychiatric: He has a normal mood and affect. His behavior is normal. Judgment and thought content normal.  Nursing note and vitals reviewed.   Labs reviewed:  Recent Labs  07/09/15 12/31/15 01/26/16  NA 137 135* 135*  K 4.3 4.6 4.6  BUN 32* 28* 26*  CREATININE 1.1 1.4* 1.2    Recent Labs  07/02/15 12/31/15 01/26/16  AST _0 ALT _1 ALKPHOS 100 80 74    Recent Labs  06/02/15 12/31/15 01/26/16  WBC 6.9 6.2 6.8  HGB 15.8 16.2 16.1  HCT 46 47 47  PLT 326 184 171   Lab Results  Component Value Date   TSH 4.24 12/31/2015   Lab Results  Component Value Date   HGBA1C 6.3 12/31/2015   Lab Results  Component Value Date   CHOL 117 05/01/2013   HDL 30 (L) 05/01/2013   LDLCALC 71 05/01/2013   TRIG 79 05/01/2013   CHOLHDL 3.9 05/01/2013    Significant Diagnostic Results in last 30 days:  No results found.  Assessment/Plan Atrial fibrillation (HCC) Rate controlled, continue Metoprolol 129m bid. Eliquis for VTE risk reduction.     HTN (hypertension) Controlled, continue Metoprolol 100 mg twice a day, Losartan 529m Furosemide 4032md, Spironolactone 81m70m0/26/17 wbc 6.2, Hgb 16.2, plt 184, Na 135, K 4.6, Bun 28, creat 1.43, TP 6.9, albumin 4.1, uric acid 8.2, TSH 4.24, Hgb a1c 6.3, BNP 254.1   Depression due to stroke (HCC) Stable, Continue Lorazepam 0.81mg50m   Chronic systolic congestive heart failure (HCC) Borondapensated clinically, continue Furosemide  40mg 93mprn Furosemide. Spironolactone 81mg. 74m6/17 wbc 6.2, Hgb 16.2, plt 184, Na 135, K 4.6, Bun 28, creat 1.43, TP 6.9, albumin 4.1, uric acid 8.2, TSH 4.24, Hgb a1c 6.3, BNP 254.1  Syncope and collapse Will obtain EKG, CXR, may CBC CMP UA C/S if patient consents. 01/26/16 wbc 6.8, Hgb 16.1, plt 141, Na 135, K 4.6, Bun 26, creat 1.18, TP 6.5, albumin 4.0 01/26/16 CXR borderline cardiomegaly without overt congestive heart failure, no acute parenchymal pathology Prn Meclizine  May ED for CVA evaluation.   GERD (gastroesophageal reflux disease) Stable, off acid reducer   Constipation Stable, continue Benefiber and daily MiraLax   Hypothyroidism 06/16/15 TSH 1.86 12/31/15 TSH 4.24 Continue Levothyroxine 181mcg  73mFamily/ staff Communication:AL  Labs/tests ordered:  EKG CXR CBC CMP UA C/S

## 2016-01-27 NOTE — Assessment & Plan Note (Signed)
Stable, off acid reducer 

## 2016-01-27 NOTE — Assessment & Plan Note (Addendum)
Will obtain EKG, CXR, may CBC CMP UA C/S if patient consents. 01/26/16 wbc 6.8, Hgb 16.1, plt 141, Na 135, K 4.6, Bun 26, creat 1.18, TP 6.5, albumin 4.0 01/26/16 CXR borderline cardiomegaly without overt congestive heart failure, no acute parenchymal pathology Prn Meclizine  May ED for CVA evaluation.

## 2016-01-27 NOTE — Assessment & Plan Note (Signed)
compensated clinically, continue Furosemide 40mg  qd, prn Furosemide. Spironolactone 25mg . 12/31/15 wbc 6.2, Hgb 16.2, plt 184, Na 135, K 4.6, Bun 28, creat 1.43, TP 6.9, albumin 4.1, uric acid 8.2, TSH 4.24, Hgb a1c 6.3, BNP 254.1

## 2016-01-29 DIAGNOSIS — I10 Essential (primary) hypertension: Secondary | ICD-10-CM | POA: Diagnosis not present

## 2016-01-29 DIAGNOSIS — N390 Urinary tract infection, site not specified: Secondary | ICD-10-CM | POA: Diagnosis not present

## 2016-01-30 IMAGING — CT CT HEAD W/O CM
1 of 2 series · 15 of 30 positions shown, 19 images · non-contrast
Comparison: CT HEAD W/O CM dated 02/19/2013; CT ANGIO HEAD W/CM
&/OR WO/CM dated 02/19/2013

CLINICAL DATA: TRANSIENT ISCHEMIC ATTACK

EXAM:
CT HEAD WITHOUT CONTRAST
TECHNIQUE: Contiguous axial images were obtained from the base of the skull
through the vertex without intravenous contrast.

[Series 3: head 2.0 h70h · axial · 0.50mm/px · z∈[+593,+739]mm · 15 of 83 slices shown, 19 images]
[im 5/83  brain]
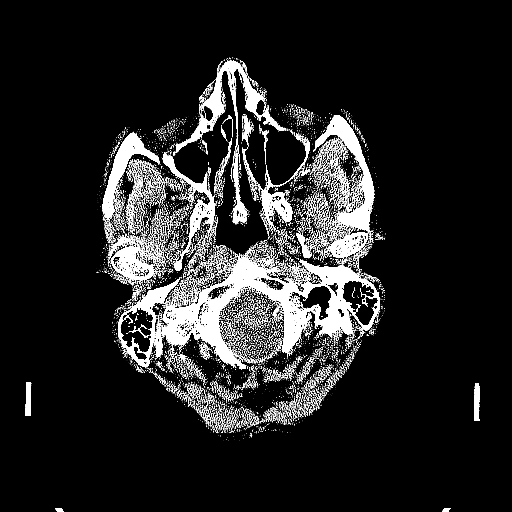
[im 5/83  bone]
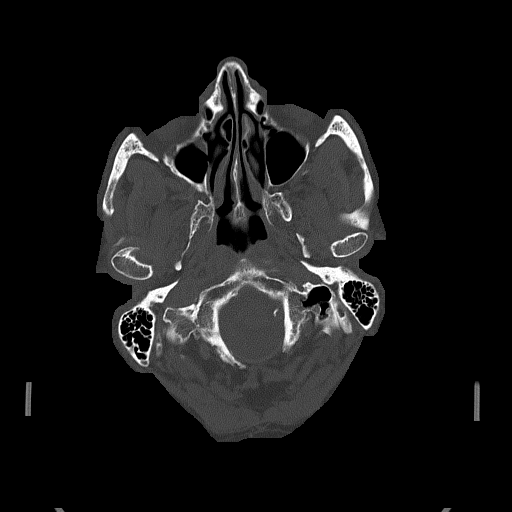
[im 9/83  brain]
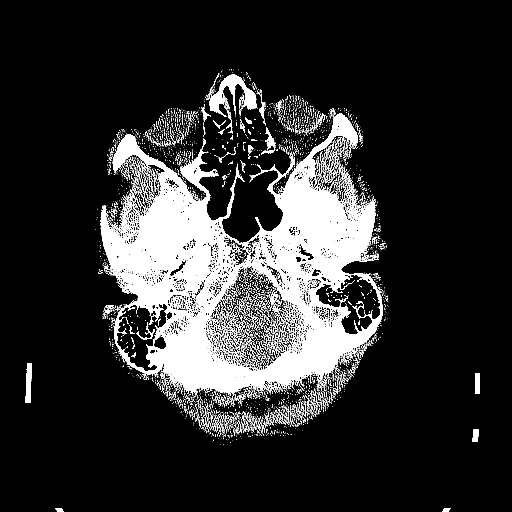
[im 17/83  brain]
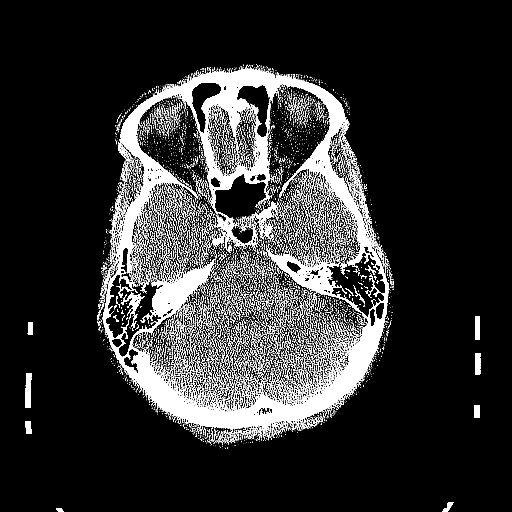
[im 21/83  brain]
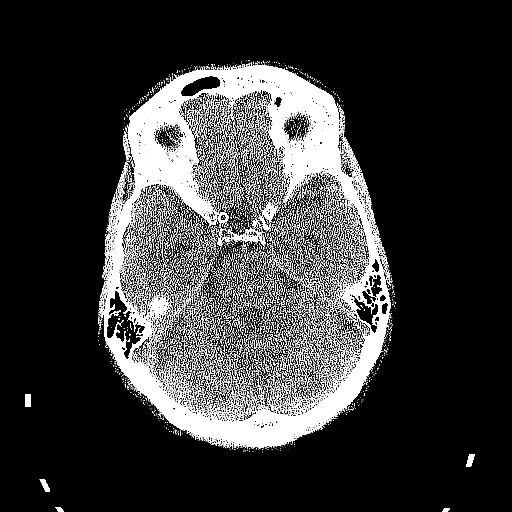
[im 25/83  brain]
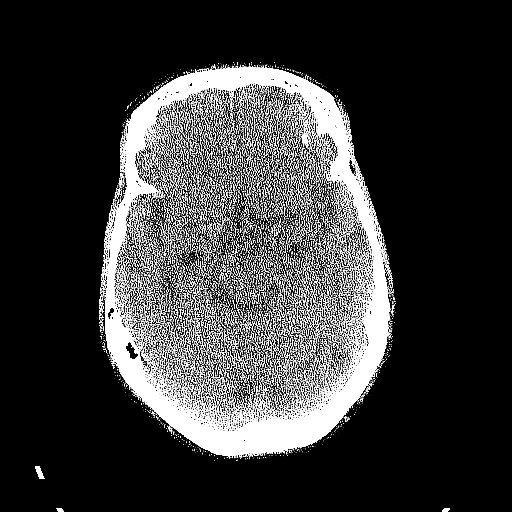
[im 25/83  bone]
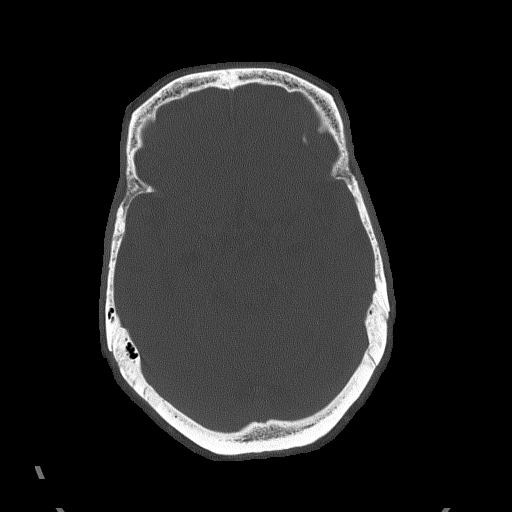
[im 29/83  brain]
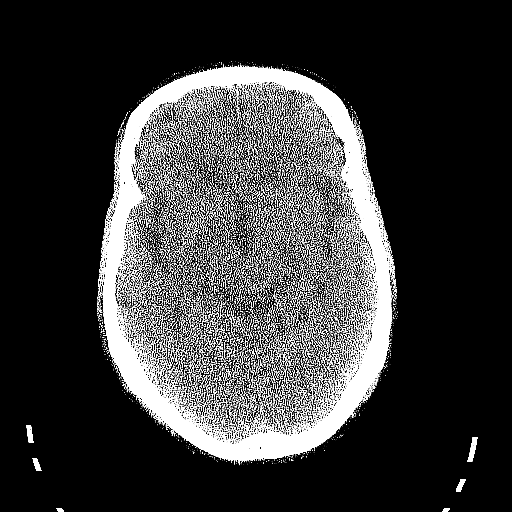
[im 37/83  brain]
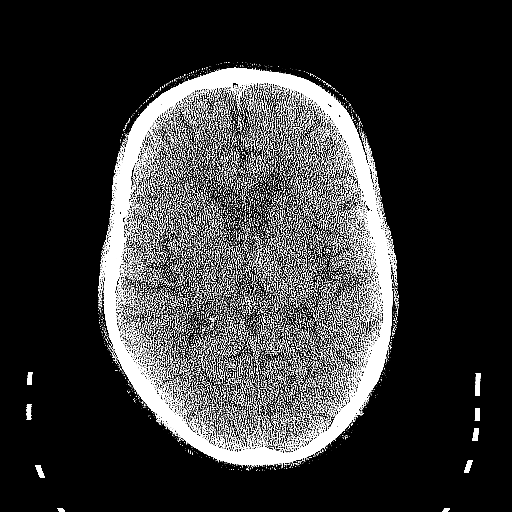
[im 42/83  brain]
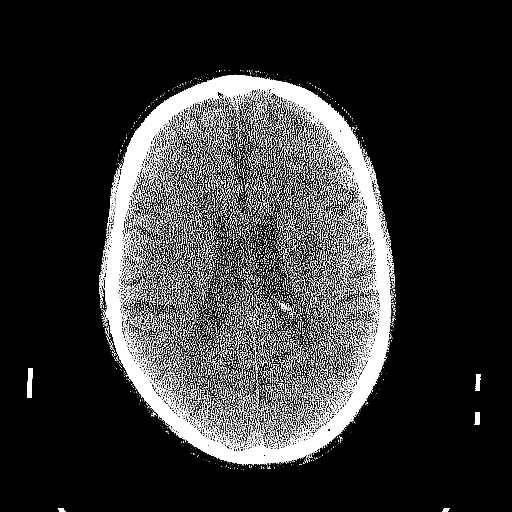
[im 46/83  brain]
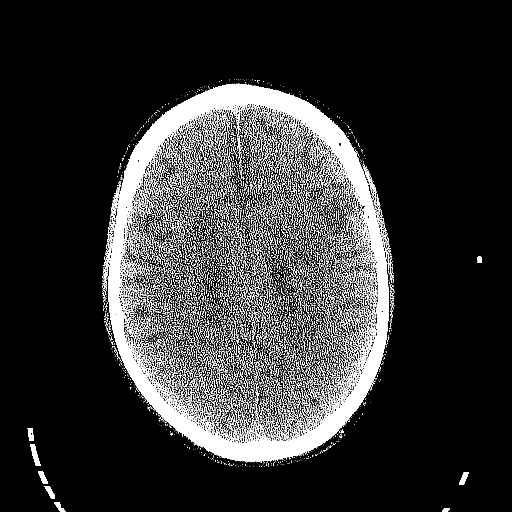
[im 46/83  bone]
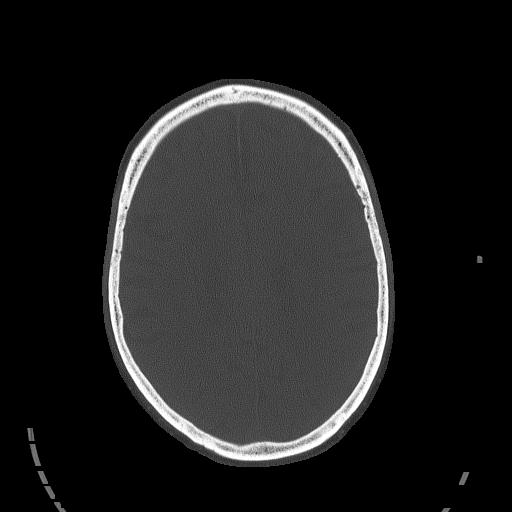
[im 54/83  brain]
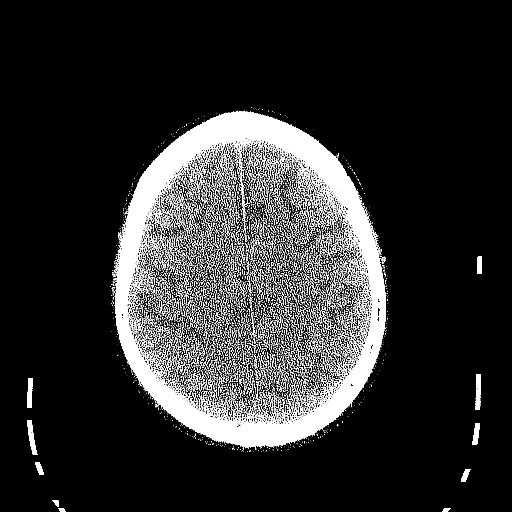
[im 58/83  brain]
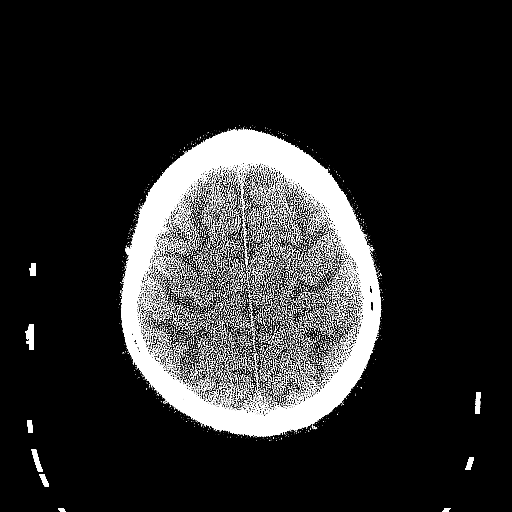
[im 62/83  brain]
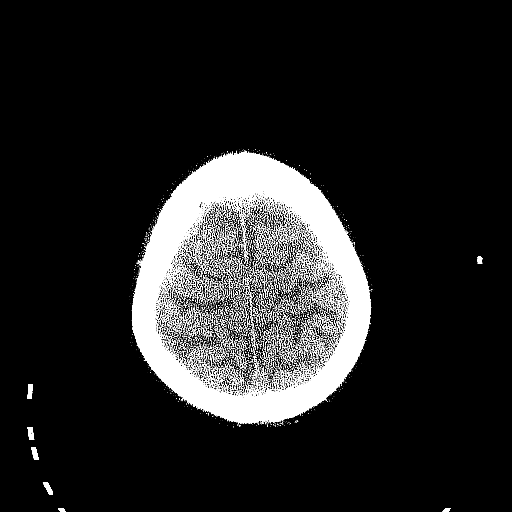
[im 66/83  brain]
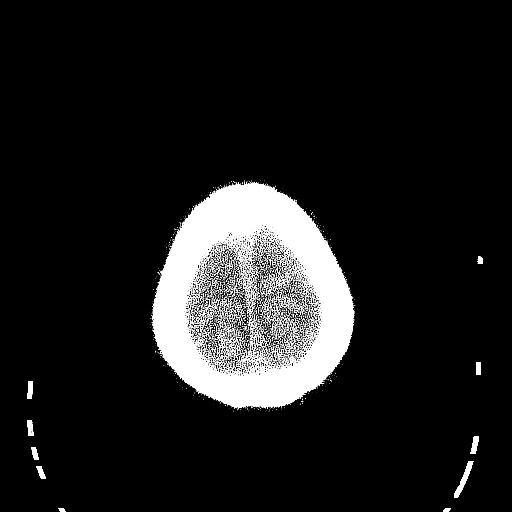
[im 66/83  bone]
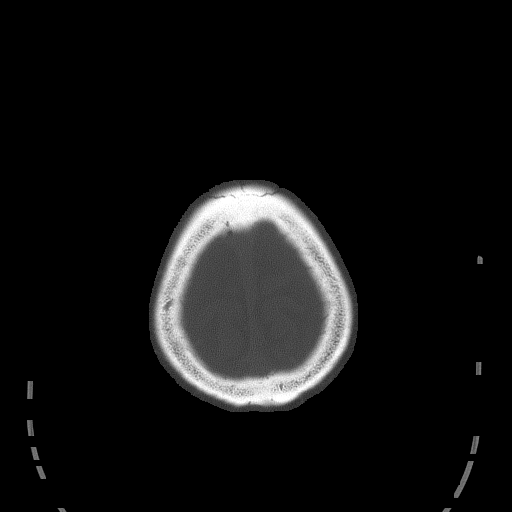
[im 74/83  brain]
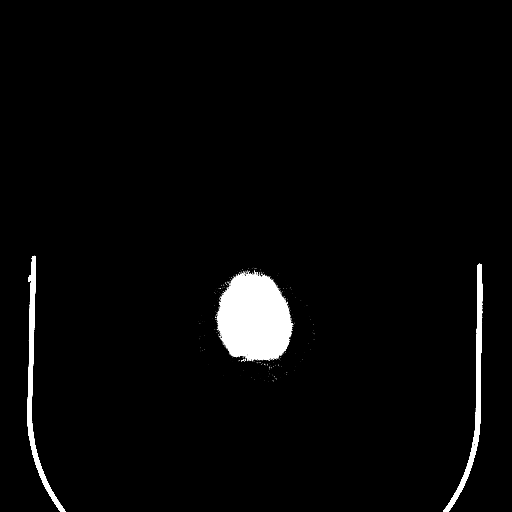
[im 78/83  brain]
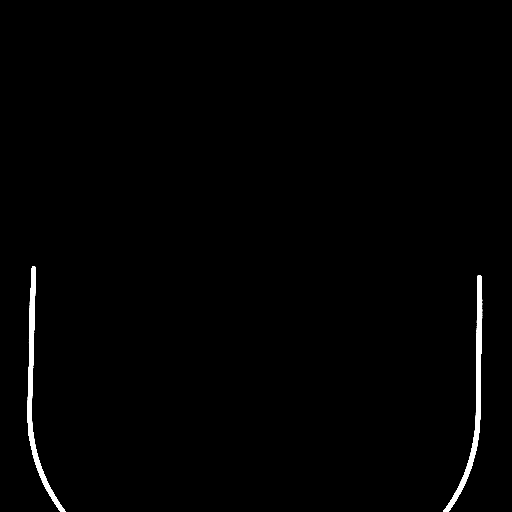

[15 of 30 positions shown; findings below may reference images not displayed]

FINDINGS: Focal area of low attenuation projects adjacent to the apex of the
left lateral ventricle. This finding has a central well defined
focus of low attenuation and has appearance of region of subacute to
chronic lacunar infarction. Global atrophy is appreciated. Diffuse
areas of low attenuation project within the subcortical, deep, and
periventricular white matter regions. There is no evidence of a
depressed skull fracture. Visualized paranasal sinuses and mastoid
air cells patent. There is no evidence of an extra-axial fluid
collection nor acute hemorrhage. There is no evidence mass effect.
The visualized paranasal sinuses and mastoid air cells are patent.
There is no evidence of a depressed skull fracture.
IMPRESSION: Findings consistent with an area of subacute to chronic lacunar
infarction left MCA distribution. Involutional and chronic changes
also appreciated. There is no evidence of acute abnormalities.

## 2016-02-11 ENCOUNTER — Encounter: Payer: Self-pay | Admitting: Internal Medicine

## 2016-02-11 ENCOUNTER — Non-Acute Institutional Stay: Payer: Medicare Other | Admitting: Internal Medicine

## 2016-02-11 VITALS — BP 134/82 | HR 61 | Temp 97.4°F | Ht 68.0 in | Wt 165.0 lb

## 2016-02-11 DIAGNOSIS — I6340 Cerebral infarction due to embolism of unspecified cerebral artery: Secondary | ICD-10-CM | POA: Diagnosis not present

## 2016-02-11 DIAGNOSIS — N182 Chronic kidney disease, stage 2 (mild): Secondary | ICD-10-CM

## 2016-02-11 DIAGNOSIS — I480 Paroxysmal atrial fibrillation: Secondary | ICD-10-CM | POA: Diagnosis not present

## 2016-02-11 DIAGNOSIS — R609 Edema, unspecified: Secondary | ICD-10-CM | POA: Diagnosis not present

## 2016-02-11 DIAGNOSIS — I5022 Chronic systolic (congestive) heart failure: Secondary | ICD-10-CM | POA: Diagnosis not present

## 2016-02-11 DIAGNOSIS — E1122 Type 2 diabetes mellitus with diabetic chronic kidney disease: Secondary | ICD-10-CM | POA: Diagnosis not present

## 2016-02-11 DIAGNOSIS — I10 Essential (primary) hypertension: Secondary | ICD-10-CM

## 2016-02-11 DIAGNOSIS — E1151 Type 2 diabetes mellitus with diabetic peripheral angiopathy without gangrene: Secondary | ICD-10-CM | POA: Diagnosis not present

## 2016-02-11 DIAGNOSIS — E039 Hypothyroidism, unspecified: Secondary | ICD-10-CM | POA: Diagnosis not present

## 2016-02-11 NOTE — Progress Notes (Signed)
[place] Room Number: [phone]  Place of Service: Clinic (12)     Allergies  Allergen Reactions  . Quinolones Other (See Comments)    Risk of rupture of AAA  . Caduet [Amlodipine-Atorvastatin] Other (See Comments)    Weakness   . Crestor [Rosuvastatin] Other (See Comments)    Muscle pain/weakness  . Lipitor [Atorvastatin Calcium] Other (See Comments)    Muscle pain/weakness  . Simvastatin Other (See Comments)    Muscle pain/weakness  . Lisinopril Other (See Comments)    Doesn't remember reaction    Chief Complaint  Patient presents with  . Medical Management of Chronic Issues    4 month medication management A-Fib, blood sugar, blood pressure, thyroid, CHF.    HPI:  Main complaint today was af a bloated and gassy feeling that is variable from day to day, but is present nearly always. He left his pants button undone today to relieve tohe tension. He has gained 8# in the last 5 months. He drinks 1 box on Boost daily (240 cal). He has a reasonable appetite and usually is at all 3 meals.  He denies diarrhea. There is mild constipation.   He worries that he may have angina. He describes a anterior chest pain that is fleeting. He took NTG once and it did not help. The pains are not related to exertion. They are not accompanied by diaphoresis, nausea, dyspnea, or palpitations. They disappear quickly.  Recent problems with dizziness have resolved. They were associated with increased dizziness with changes in head position, especially when looking down and then up.  For the last 20 years, he has not had erections, but but in the last couple of months, he has started to have erections that last variable lengths of time. He describes them as soft. He is asking if this is a normal thing to happen.  Paroxysmal atrial fibrillation (HCC) - stable  Chronic systolic congestive heart failure ([place]) - compensated  CKD stage 2 due to type 2 diabetes mellitus ([place]) -  stable  Type 2 diabetes mellitus with diabetic peripheral angiopathy without gangrene, without long-term current use of insulin (HCC) - controlled  Hypothyroidism, unspecified type - compensated  Essential hypertension - controlled  Edema, unspecified type - unchanged    Medications: Patient's Medications  New Prescriptions   No medications on file  Previous Medications   ACETAMINOPHEN (TYLENOL) 325 MG TABLET    Take 325 mg by mouth daily.    ALLOPURINOL (ZYLOPRIM) 100 MG TABLET    Take 50 mg by mouth daily.    AMOXICILLIN (AMOXIL) 500 MG CAPSULE    Take 500 mg by mouth. Take 4 caps prior to dental procedures   APIXABAN (ELIQUIS) 2.5 MG TABS TABLET    Take 1 tablet (2.5 mg total) by mouth 2 (two) times daily.   ASPIRIN EC 81 MG TABLET    Take 81 mg by mouth daily.   CARBOXYMETHYLCELLULOSE (REFRESH PLUS) 0.5 % SOLN    Place 1 drop into both eyes every evening.   CHOLECALCIFEROL (VITAMIN D) 400 UNITS CAPSULE    Take 400 Units by mouth 2 (two) times daily.    FUROSEMIDE (LASIX) 40 MG TABLET    Take 40 mg by mouth daily. Take one tablet as needed if weight gain 3  Lbs in 24 hours or 5 lbs in a week   HYDROCORTISONE (PROCTOCORT) 1 % CREA    Apply twice daily as needed to relieve hemorrhoid discomfort  IPRATROPIUM-ALBUTEROL (DUONEB) 0.5-2.5 (3) MG/3ML SOLN    Take 3 mLs by nebulization 4 (four) times daily as needed.   LEVOTHYROXINE (SYNTHROID, LEVOTHROID) 125 MCG TABLET    Take 125 mcg by mouth daily before breakfast.   LORAZEPAM (ATIVAN) 0.5 MG TABLET    Take 0.25 mg by mouth. Take 1/2 tablet once a day; take 1/2 tablet every four hours as needed   LOSARTAN (COZAAR) 25 MG TABLET    Take 1 tablet (25 mg total) by mouth daily.   MECLIZINE (ANTIVERT) 25 MG TABLET    Take 25 mg by mouth. Take one tablet every 6 hours as needed for dizziness   MENTHOL, TOPICAL ANALGESIC, (BIOFREEZE) 4 % GEL    Apply topically. Apply to left knee four times daily as needed for pain   METOPROLOL (LOPRESSOR) 100  MG TABLET    Take 100 mg by mouth 2 (two) times daily.   NITROGLYCERIN (NITROSTAT) 0.4 MG SL TABLET    Place 0.4 mg under the tongue every 5 (five) minutes as needed for chest pain.    NUTRITIONAL SUPPLEMENTS (BOOST COMPACT) LIQD    Take 1 Container by mouth daily. 237 mls daily   OLOPATADINE (PATANOL) 0.1 % OPHTHALMIC SOLUTION    Place 1 drop into both eyes 2 (two) times daily. As needed for itching/irritation.   OXYGEN    Inhale 2 L/min into the lungs as needed.   POLYETHYLENE GLYCOL (MIRALAX / GLYCOLAX) PACKET    Take 17 g by mouth daily.   POLYVINYL ALCOHOL (LIQUIFILM TEARS) 1.4 % OPHTHALMIC SOLUTION    Place 1 drop into both eyes. One drop in both eyes twice daily as needed for dry eyes   PROTEIN SUPPLEMENT (RESOURCE BENEPROTEIN) POWD    Take 1 scoop by mouth daily as needed.   SPIRONOLACTONE (ALDACTONE) 25 MG TABLET    Take 25 mg by mouth daily.   TAMSULOSIN (FLOMAX) 0.4 MG CAPS CAPSULE    Take one cap by mouth daily   TOBRAMYCIN-DEXAMETHASONE (TOBRADEX) OPHTHALMIC OINTMENT    Place 1 application into both eyes 2 (two) times daily.  Modified Medications   No medications on file  Discontinued Medications   CARBAMIDE PEROXIDE (DEBROX) 6.5 % OTIC SOLUTION    Place 5 drops of medication into affected ear at night for 3 days, then irrigate with bulb syringe.     Review of Systems  Constitutional: Negative for fever and unexpected weight change.       Generalized weakness  HENT: Positive for hearing loss and rhinorrhea. Negative for congestion, ear discharge, ear pain and tinnitus.   Eyes:       Chronic irritation of the right eye due to ectropion and failure of the lids to close during the day  Respiratory: Positive for cough and shortness of breath. Negative for wheezing.   Cardiovascular: Positive for leg swelling (sometimes, not apparent today. ). Negative for palpitations.       Trace edema BLE, R>L  Gastrointestinal: Negative for abdominal pain and diarrhea.       Hx o dysphagia  since his CVA. He thinks this has improved. Episodes of fecal incontinence. Hx of hemorrhoids.A tender Pilonidal cyst like growth at the right side of anus. Increased gas and fecal passage sometimes when his bladder moves.  Genitourinary: Positive for frequency and urgency.       Incontinent from time to time. Followed by Dr. Matilde Sprang.  Musculoskeletal: Positive for back pain. Negative for neck pain.  Skin: Negative for rash.  History of itching in the groin, which is chronic secondary to irritation from urinary incontinence, fecal incontinence, and adult diapering. Ulceration right medial malleolus since mid August 2017.  Neurological: Positive for weakness.       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 Recurrent CVA on 04/30/13 of the left MCA area Right facial weakness  Psychiatric/Behavioral: The patient is not nervous/anxious.     Vitals:   02/11/16 1511  BP: 134/82  Pulse: 61  Temp: 97.4 F (36.3 C)  TempSrc: Oral  SpO2: 95%  Weight: 165 lb (74.8 kg)  Height: '5\' 8"'$  (1.727 m)   Wt Readings from Last 3 Encounters:  02/11/16 165 lb (74.8 kg)  01/27/16 160 lb (72.6 kg)  01/21/16 160 lb (72.6 kg)    Body mass index is 25.09 kg/m.  Physical Exam  Constitutional: He is oriented to person, place, and time. He appears well-developed and well-nourished. No distress.  HENT:  Severe hearing loss. Bilateral aides. Right facial droop.   Eyes:  Corrective lenses. Increased exposure of the right cornea due to post CVA facial droop. Irregular pupil on the left.  Neck: No JVD present. No tracheal deviation present. No thyromegaly present.  Cardiovascular: Normal rate and normal heart sounds.  Frequent extrasystoles are present.  No murmur heard. Pacemaker left upper chest  Pulmonary/Chest: Effort normal. He has no wheezes. He has rales.  dry rales posterior lung bases.   Abdominal: Soft. Bowel sounds are normal. He exhibits no  distension and no mass. There is no tenderness.  Genitourinary:  Genitourinary Comments: .A tender Pilonidal cyst like growth at the right side of anus  Musculoskeletal: Normal range of motion. He exhibits edema. He exhibits no tenderness.  Unstable gait. Using 4 wheel walker and w/c. Weaker on the right side. Hx of the right total knee replacement. Trace edema BLE, R>L. C/o left knee pain, f/u Ortho. Motorized w/c to go further  Lymphadenopathy:    He has no cervical adenopathy.  Neurological: He is alert and oriented to person, place, and time. He displays normal reflexes. No cranial nerve deficit. He exhibits normal muscle tone. Coordination normal.  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 Recurrent CVA on 04/30/13 of the left MCA area Right facial weakness. Facial asymmetry is less evident today.  Skin: Skin is warm and dry. No rash noted. No erythema. No pallor.  2 mm ulceration of the right medial malleolus. Slight surrounding erythema. Quite tender to touch. Shallow scab on top of it.  Psychiatric: He has a normal mood and affect. His behavior is normal. Judgment and thought content normal.  Nursing note and vitals reviewed.    Labs reviewed: Lab Summary Latest Ref Rng & Units 01/26/2016 12/31/2015 07/09/2015 07/02/2015  Hemoglobin 13.5 - 17.5 g/dL 16.1 16.2 (None) (None)  Hematocrit 41 - 53 % 47 47 (None) (None)  White count 10:3/mL 6.8 6.2 (None) (None)  Platelet count 150 - 399 K/L 171 184 (None) (None)  Sodium 137 - 147 mmol/L 135(A) 135(A) 137 132(A)  Potassium 3.4 - 5.3 mmol/L 4.6 4.6 4.3 4.7  Calcium - (None) (None) (None) (None)  Phosphorus - (None) (None) (None) (None)  Creatinine 0.6 - 1.3 mg/dL 1.2 1.4(A) 1.1 1.5(A)  AST 14 - 40 U/L 25 20 (None) 27  Alk Phos 25 - 125 U/L 74 80 (None) 100  Bilirubin - (None) (None) (None) (None)  Glucose mg/dL 112 28 86 105  Cholesterol - (None) (None) (None) (  None)  HDL cholesterol -  (None) (None) (None) (None)  Triglycerides - (None) (None) (None) (None)  LDL Direct - (None) (None) (None) (None)  LDL Calc - (None) (None) (None) (None)  Total protein - (None) (None) (None) (None)  Albumin - (None) (None) (None) (None)  Some recent data might be hidden   Lab Results  Component Value Date   TSH 4.24 12/31/2015   Lab Results  Component Value Date   BUN 26 (A) 01/26/2016   BUN 28 (A) 12/31/2015   BUN 32 (A) 07/09/2015   Lab Results  Component Value Date   CREATININE 1.2 01/26/2016   CREATININE 1.4 (A) 12/31/2015   CREATININE 1.1 07/09/2015   Lab Results  Component Value Date   HGBA1C 6.3 12/31/2015   HGBA1C 6.9 07/02/2015   HGBA1C 6.5 (A) 08/29/2013       Assessment/Plan  1. Paroxysmal atrial fibrillation (HCC) Currently in NSR  2. Chronic systolic congestive heart failure (HCC) compensated  3. CKD stage 2 due to type 2 diabetes mellitus ([place]) Follow lab  4. Type 2 diabetes mellitus with diabetic peripheral angiopathy without gangrene, without long-term current use of insulin ([place]) Follow lab  5. Hypothyroidism, unspecified type compensated  6. Essential hypertension controlled  7. Edema, unspecified type Improved  I advised him that the erections were normal.  I advised him that I think his fleeting chest pains are not likely to be angina.

## 2016-02-17 LAB — CUP PACEART REMOTE DEVICE CHECK
Battery Voltage: 2.93 V
Implantable Lead Implant Date: 20111021
Implantable Lead Location: 753859
Implantable Pulse Generator Implant Date: 20111021
Lead Channel Impedance Value: 408 Ohm
Lead Channel Impedance Value: 432 Ohm
Lead Channel Setting Sensing Sensitivity: 0.9 mV
MDC IDC LEAD IMPLANT DT: 20111021
MDC IDC LEAD LOCATION: 753860
MDC IDC LEAD MODEL: 5086
MDC IDC LEAD MODEL: 5086
MDC IDC MSMT LEADCHNL RV SENSING INTR AMPL: 7.448 mV
MDC IDC SESS DTM: 20171102190500
MDC IDC SET LEADCHNL RV PACING AMPLITUDE: 2.5 V
MDC IDC SET LEADCHNL RV PACING PULSEWIDTH: 0.4 ms
MDC IDC STAT BRADY RV PERCENT PACED: 93.04 %

## 2016-02-22 ENCOUNTER — Non-Acute Institutional Stay: Payer: Medicare Other | Admitting: Internal Medicine

## 2016-02-22 ENCOUNTER — Encounter: Payer: Self-pay | Admitting: Internal Medicine

## 2016-02-22 DIAGNOSIS — H44009 Unspecified purulent endophthalmitis, unspecified eye: Secondary | ICD-10-CM | POA: Diagnosis not present

## 2016-02-22 MED ORDER — ERYTHROMYCIN 5 MG/GM OP OINT: TOPICAL_OINTMENT | OPHTHALMIC | 0 refills | Status: DC

## 2016-02-22 MED ORDER — SULFAMETHOXAZOLE-TRIMETHOPRIM 800-160 MG PO TABS: ORAL_TABLET | ORAL | 0 refills | Status: DC

## 2016-02-22 NOTE — Progress Notes (Signed)
Progress Note    Location:  [place] Room Number: [phone] Place of Service:  ALF [phone]) Provider:  Jeanmarie Hubert, MD  Patient Care Team: [name], MD as PCP - General (Internal Medicine) [facility] Pixie Casino, MD as Consulting Physician (Cardiology) Ardis Hughs, MD as Attending Physician (Urology)  Extended Emergency Contact Information Primary Emergency Contact: [name] of [place] Phone: [phone] Relation: Daughter Secondary Emergency Contact: Eyvonne Mechanic [place] of [place] Phone: [phone] Mobile Phone: [phone] Relation: Daughter  Code Status:  DNR Goals of care: Advanced Directive information Advanced Directives 02/22/2016  Does Patient Have a Medical Advance Directive? Yes  Type of Paramedic of [place];Living will;Out of facility DNR (pink MOST or yellow form)  Does patient want to make changes to medical advance directive? -  Copy of [place] in Chart? Yes  Pre-existing out of facility DNR order (yellow form or pink MOST form) Yellow form placed in chart (order not valid for inpatient use)     Chief Complaint  Patient presents with  . Acute Visit    left eye red, uncomforable    HPI:  Pt is a 82 y.o. male seen today for an acute visit for left periorbital swelling and increase in left eye drainage. Had pain in the left eye yesterday.Conjunctivae are clear, but the is swelling of the left periorbital area. Afebrile.   Past Medical History:  Diagnosis Date  . Abnormal PFTs 10/30/2012   Followed in Pulmonary clinic/ [facility]/ [place]  - PFT's 10/22/12 VC  55% no obst, DLCO 50%  - PFT's 12/20/2012 VC 83% and no obst, dLCO 55%  -11/08/2012  Walked RA x 3 laps @ 185 ft each stopped due to  End of study, not desat -11/08/12 esr 10    . Abnormality of gait 04/18/2013  . Acute on chronic systolic congestive heart  failure, NYHA class 2 ([place]) 03/16/2006   BNP 376.9 05/28/13   . Adjustment disorder with mixed anxiety and depressed mood 04/18/2013   Post CVA depression and anxiety   . Allergy   . Anxiety   . Aortic aneurysm of unspecified site without mention of rupture 10/27/2011  . Arthritis   . Atherosclerosis of renal artery ([place]) 10/18/2006  . Atrial fibrillation ([place]) 03/04/2011    He was on coumadin until his stroke last December and was switched to apixaban 2.34m daily which he has been taking faithfully without reported side effects.    . Balance disorder 07/31/2014  . Basal cell carcinoma of skin of other and unspecified parts of face 12/24/2009  . Blood transfusion without reported diagnosis   . Cardiac pacemaker in situ- MDT 10/11 03/18/2010   Medtronic revo implant in October 2011. Severe sinus bradycardia in the 30s, but not truly pacemaker dependent   . Cataract   . CKD stage 2 due to type 2 diabetes mellitus ([place] 07/03/2014  . Coronary artery disease   . CVA - Rt brain stroke 12/14 02/18/2013   02/19/13 angiography CT head:  Diffuse atherosclerotic irregularity and plaque formation of the distal right common carotid artery and proximal internal carotid artery without significant stenosis. Plaque ulceration is present and is a source of emboli. A small right thalamic CVA    . DM (diabetes mellitus), type 2 with renal complications ([place] 48/11/9831 . Dyslipidemia 11/03/2004  . Fall 03/04/2011  . Fatigue 04/18/2013  . Gout, unspecified 11/03/2004  .  Heart murmur   . History of abdominal aortic aneurysm   . History of Doppler ultrasound 05/22/2012   LEAs; R anterior tibial artery appeared occluded; L posterior tibial shows short segment of occlusive ds  . History of Doppler ultrasound 05/22/2012   Abdominal Aortic Doppler; slight increase in fusiform aneurysm   . History of echocardiogram 12/2008   EF >55%; mild concentric LVH; mild MR; mild-mod TR; mild AV regurg;   . History of nuclear stress  test 12/2010   lexiscan; low risk; compared to prior study, perfusion improved  . HTN (hypertension) 03/04/2011  . Hyperlipidemia   . Hypertension   . Hypertrophy of prostate with urinary obstruction and other lower urinary tract symptoms (LUTS) 04/28/2004  . Hyponatremia 03/04/2011  . Hypothyroidism 03/04/2011  . Impotence of organic origin 10/03/2000  . Internal hemorrhoids without mention of complication 32/20/2542  . Long term (current) use of anticoagulants 03/17/2011  . Major depressive disorder, single episode, unspecified 03/05/2009  . Memory change 01/24/2013  . Muscle weakness (generalized) 03/10/2011  . Nocturia 10/27/2011  . Obstructive sleep apnea (adult) (pediatric) 07/23/2008  . Open wound of knee, leg (except thigh), and ankle, complicated 09/10/2374  . Osteoarthritis of both knees 07/31/2014  . Osteoarthrosis, unspecified whether generalized or localized, unspecified site 02/05/2003  . PAF (paroxysmal atrial fibrillation) (HCC)    coumadin  . Pain in joint, lower leg 08/04/2011  . Pruritic condition 01/24/2013  . Quadriceps weakness 07/31/2014  . Right bundle branch block 04/19/2006  . 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 Spinal stenosis in cervical region 12/15/2004  . SSS (sick sinus syndrome) ([place]) 04/08/2014  . Stroke ([place])   . Thyroid disease   . TIA (transient ischemic attack) 04/30/2013  . Type II or unspecified type diabetes mellitus without mention of complication, not stated as uncontrolled 11/24/2004  . Unspecified vitamin D deficiency 10/18/2006  . 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 Increased exposure of the right cornea.    Past Surgical History:  Procedure Laterality Date  . CORONARY ANGIOPLASTY WITH STENT PLACEMENT  2008   stent to SVG to OM  . CORONARY ARTERY BYPASS GRAFT  1989  . EYE SURGERY    . HERNIA REPAIR    . INSERT / REPLACE / REMOVE PACEMAKER    . JOINT REPLACEMENT    .  PACEMAKER INSERTION  2011    Allergies  Allergen Reactions  . Quinolones Other (See Comments)    Risk of rupture of AAA  . Caduet [Amlodipine-Atorvastatin] Other (See Comments)    Weakness   . Crestor [Rosuvastatin] Other (See Comments)    Muscle pain/weakness  . Lipitor [Atorvastatin Calcium] Other (See Comments)    Muscle pain/weakness  . Simvastatin Other (See Comments)    Muscle pain/weakness  . Lisinopril Other (See Comments)    Doesn't remember reaction    Allergies as of 02/22/2016      Reactions   Quinolones Other (See Comments)   Risk of rupture of AAA   Caduet [amlodipine-atorvastatin] Other (See Comments)   Weakness   Crestor [rosuvastatin] Other (See Comments)   Muscle pain/weakness   Lipitor [atorvastatin Calcium] Other (See Comments)   Muscle pain/weakness   Simvastatin Other (See Comments)   Muscle pain/weakness   Lisinopril Other (See Comments)   Doesn't remember reaction      Medication List       Accurate as of 02/22/16  4:50 PM. Always  use your most recent med list.          acetaminophen 325 MG tablet Commonly known as:  TYLENOL Take 325 mg by mouth daily.   allopurinol 100 MG tablet Commonly known as:  ZYLOPRIM Take 50 mg by mouth daily.   amoxicillin 500 MG capsule Commonly known as:  AMOXIL Take 500 mg by mouth. Take 4 caps prior to dental procedures   apixaban 2.5 MG Tabs tablet Commonly known as:  ELIQUIS Take 1 tablet (2.5 mg total) by mouth 2 (two) times daily.   aspirin EC 81 MG tablet Take 81 mg by mouth daily.   BIOFREEZE 4 % Gel Generic drug:  Menthol (Topical Analgesic) Apply topically. Apply to left knee four times daily as needed for pain   BOOST COMPACT Liqd Take 1 Container by mouth daily. 237 mls daily   carboxymethylcellulose 0.5 % Soln Commonly known as:  REFRESH PLUS Place 1 drop into both eyes every evening.   furosemide 40 MG tablet Commonly known as:  LASIX Take 40 mg by mouth daily. Take one tablet  as needed if weight gain 3  Lbs in 24 hours or 5 lbs in a week   hydrocortisone 1 % Crea Commonly known as:  PROCTOCORT Apply twice daily as needed to relieve hemorrhoid discomfort   ipratropium-albuterol 0.5-2.5 (3) MG/3ML Soln Commonly known as:  DUONEB Take 3 mLs by nebulization 4 (four) times daily as needed.   levothyroxine 125 MCG tablet Commonly known as:  SYNTHROID, LEVOTHROID Take 125 mcg by mouth daily before breakfast.   LORazepam 0.5 MG tablet Commonly known as:  ATIVAN Take 0.25 mg by mouth. Take 1/2 tablet once a day; take 1/2 tablet every four hours as needed   losartan 25 MG tablet Commonly known as:  COZAAR Take 1 tablet (25 mg total) by mouth daily.   meclizine 25 MG tablet Commonly known as:  ANTIVERT Take 25 mg by mouth. Take one tablet every 6 hours as needed for dizziness   metoprolol 100 MG tablet Commonly known as:  LOPRESSOR Take 100 mg by mouth 2 (two) times daily.   nitroGLYCERIN 0.4 MG SL tablet Commonly known as:  NITROSTAT Place 0.4 mg under the tongue every 5 (five) minutes as needed for chest pain.   olopatadine 0.1 % ophthalmic solution Commonly known as:  PATANOL Place 1 drop into both eyes 2 (two) times daily. As needed for itching/irritation.   OXYGEN Inhale 2 L/min into the lungs as needed.   polyethylene glycol packet Commonly known as:  MIRALAX / GLYCOLAX Take 17 g by mouth daily.   polyvinyl alcohol 1.4 % ophthalmic solution Commonly known as:  LIQUIFILM TEARS Place 1 drop into both eyes. One drop in both eyes twice daily as needed for dry eyes   protein supplement Powd Take 1 scoop by mouth daily as needed.   spironolactone 25 MG tablet Commonly known as:  ALDACTONE Take 25 mg by mouth daily.   tamsulosin 0.4 MG Caps capsule Commonly known as:  FLOMAX Take one cap by mouth daily   tobramycin-dexamethasone ophthalmic ointment Commonly known as:  TOBRADEX Place 1 application into both eyes 2 (two) times daily.     Vitamin D 400 units capsule Take 400 Units by mouth 2 (two) times daily.       Review of Systems  Constitutional: Negative for fever and unexpected weight change.       Generalized weakness  HENT: Positive for hearing loss and rhinorrhea. Negative for congestion,  ear discharge, ear pain and tinnitus.        Left periorbital swelling and discomfort.  Eyes:       Chronic irritation of the right eye due to ectropion and failure of the lids to close during the day  Respiratory: Positive for cough and shortness of breath. Negative for wheezing.   Cardiovascular: Positive for leg swelling (sometimes, not apparent today. ). Negative for palpitations.       Trace edema BLE, R>L  Gastrointestinal: Negative for abdominal pain and diarrhea.       Hx o dysphagia since his CVA. He thinks this has improved. Episodes of fecal incontinence. Hx of hemorrhoids.A tender Pilonidal cyst like growth at the right side of anus. Increased gas and fecal passage sometimes when his bladder moves.  Genitourinary: Positive for frequency and urgency.       Incontinent from time to time. Followed by Dr. Matilde Sprang.  Musculoskeletal: Positive for back pain. Negative for neck pain.  Skin: Negative for rash.       History of itching in the groin, which is chronic secondary to irritation from urinary incontinence, fecal incontinence, and adult diapering. Ulceration right medial malleolus since mid August 2017.  Neurological: Positive for weakness.       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 Recurrent CVA on 04/30/13 of the left MCA area Right facial weakness  Psychiatric/Behavioral: The patient is not nervous/anxious.     Immunization History  Administered Date(s) Administered  . Influenza Whole 12/06/2011, 12/19/2012  . Influenza-Unspecified 01/09/2014, 11/25/2014, 12/23/2015  . PPD Test 05/03/2013  . Pneumococcal Polysaccharide-23 03/07/1986  . Td 04/20/2005    Pertinent  Health Maintenance Due  Topic Date Due  . OPHTHALMOLOGY EXAM  01/12/1932  . PNA vac Low Risk Adult (1 of 2 - PCV13) 01/12/1989  . FOOT EXAM  07/31/2015  . HEMOGLOBIN A1C  06/30/2016  . INFLUENZA VACCINE  Completed   Fall Risk  10/15/2015 06/11/2015 10/23/2014 07/07/2014 02/17/2014  Falls in the past year? No Yes Yes Yes Exclusion - non ambulatory  Number falls in past yr: - _0 -  Injury with Fall? - Yes - No -  Risk for fall due to : - - - History of fall(s);Impaired mobility;Impaired balance/gait -  Follow up - - - Falls evaluation completed;Falls prevention discussed -   Functional Status Survey:    Vitals:   02/22/16 1639  BP: 125/60  Pulse: 78  Resp: 18  Temp: 98.1 F (36.7 C)  SpO2: 97%  Weight: 161 lb (73 kg)  Height: 5' 8" (1.727 m)   Body mass index is 24.48 kg/m. Physical Exam  Constitutional: He is oriented to person, place, and time. He appears well-developed and well-nourished. No distress.  HENT:  Severe hearing loss. Bilateral aides. Right facial droop.   Eyes:  Corrective lenses. Increased exposure of the right cornea due to post CVA facial droop. Irregular pupil on the left. Left periorbital swelling, increased clear drainage.  Neck: No JVD present. No tracheal deviation present. No thyromegaly present.  Cardiovascular: Normal rate and normal heart sounds.  Frequent extrasystoles are present.  No murmur heard. Pacemaker left upper chest  Pulmonary/Chest: Effort normal. He has no wheezes. He has rales.  dry rales posterior lung bases.   Abdominal: Soft. Bowel sounds are normal. He exhibits no distension and no mass. There is no tenderness.  Genitourinary:  Genitourinary Comments: .A tender Pilonidal cyst like growth at the right side of anus  Musculoskeletal: Normal range of motion. He exhibits edema. He exhibits no tenderness.  Unstable gait. Using 4 wheel walker and w/c. Weaker on the right side. Hx of the right total knee replacement.  Trace edema BLE, R>L. C/o left knee pain, f/u Ortho. Motorized w/c to go further  Lymphadenopathy:    He has no cervical adenopathy.  Neurological: He is alert and oriented to person, place, and time. He displays normal reflexes. No cranial nerve deficit. He exhibits normal muscle tone. Coordination normal.  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 Recurrent CVA on 04/30/13 of the left MCA area Right facial weakness. Facial asymmetry is less evident today.  Skin: Skin is warm and dry. No rash noted. No erythema. No pallor.  2 mm ulceration of the right medial malleolus. Slight surrounding erythema. Quite tender to touch. Shallow scab on top of it.  Psychiatric: He has a normal mood and affect. His behavior is normal. Judgment and thought content normal.  Nursing note and vitals reviewed.   Labs reviewed:  Recent Labs  07/09/15 12/31/15 01/26/16  NA 137 135* 135*  K 4.3 4.6 4.6  BUN 32* 28* 26*  CREATININE 1.1 1.4* 1.2    Recent Labs  07/02/15 12/31/15 01/26/16  AST _0 ALT _1 ALKPHOS 100 80 74    Recent Labs  06/02/15 12/31/15 01/26/16  WBC 6.9 6.2 6.8  HGB 15.8 16.2 16.1  HCT 46 47 47  PLT 326 184 171   Lab Results  Component Value Date   TSH 4.24 12/31/2015   Lab Results  Component Value Date   HGBA1C 6.3 12/31/2015   Lab Results  Component Value Date   CHOL 117 05/01/2013   HDL 30 (L) 05/01/2013   LDLCALC 71 05/01/2013   TRIG 79 05/01/2013   CHOLHDL 3.9 05/01/2013    Assessment/Plan 1. Infection of eye region - sulfamethoxazole-trimethoprim (BACTRIM DS,SEPTRA DS) 800-160 MG tablet; One twice daily to treat infection  Dispense: 20 tablet; Refill: 0 - erythromycin ophthalmic ointment; Apply nightly to the left eye at bedtime for 5 nights  Dispense: 3.5 g; Refill: 0

## 2016-02-25 ENCOUNTER — Encounter: Payer: Medicare Other | Admitting: Internal Medicine

## 2016-03-10 DIAGNOSIS — M545 Low back pain: Secondary | ICD-10-CM | POA: Diagnosis not present

## 2016-04-06 ENCOUNTER — Ambulatory Visit (INDEPENDENT_AMBULATORY_CARE_PROVIDER_SITE_OTHER): Payer: Medicare Other | Admitting: Ophthalmology

## 2016-04-06 ENCOUNTER — Non-Acute Institutional Stay: Payer: Medicare Other | Admitting: Nurse Practitioner

## 2016-04-06 ENCOUNTER — Encounter: Payer: Self-pay | Admitting: Nurse Practitioner

## 2016-04-06 DIAGNOSIS — IMO0002 Reserved for concepts with insufficient information to code with codable children: Secondary | ICD-10-CM

## 2016-04-06 DIAGNOSIS — I480 Paroxysmal atrial fibrillation: Secondary | ICD-10-CM | POA: Diagnosis not present

## 2016-04-06 DIAGNOSIS — F0631 Mood disorder due to known physiological condition with depressive features: Secondary | ICD-10-CM

## 2016-04-06 DIAGNOSIS — M150 Primary generalized (osteo)arthritis: Secondary | ICD-10-CM | POA: Diagnosis not present

## 2016-04-06 DIAGNOSIS — K219 Gastro-esophageal reflux disease without esophagitis: Secondary | ICD-10-CM | POA: Diagnosis not present

## 2016-04-06 DIAGNOSIS — I639 Cerebral infarction, unspecified: Secondary | ICD-10-CM | POA: Diagnosis not present

## 2016-04-06 DIAGNOSIS — I10 Essential (primary) hypertension: Secondary | ICD-10-CM | POA: Diagnosis not present

## 2016-04-06 DIAGNOSIS — N401 Enlarged prostate with lower urinary tract symptoms: Secondary | ICD-10-CM | POA: Diagnosis not present

## 2016-04-06 DIAGNOSIS — K5900 Constipation, unspecified: Secondary | ICD-10-CM | POA: Diagnosis not present

## 2016-04-06 DIAGNOSIS — M159 Polyosteoarthritis, unspecified: Secondary | ICD-10-CM

## 2016-04-06 DIAGNOSIS — E1122 Type 2 diabetes mellitus with diabetic chronic kidney disease: Secondary | ICD-10-CM

## 2016-04-06 DIAGNOSIS — E039 Hypothyroidism, unspecified: Secondary | ICD-10-CM | POA: Diagnosis not present

## 2016-04-06 DIAGNOSIS — I5022 Chronic systolic (congestive) heart failure: Secondary | ICD-10-CM | POA: Diagnosis not present

## 2016-04-06 DIAGNOSIS — R609 Edema, unspecified: Secondary | ICD-10-CM

## 2016-04-06 DIAGNOSIS — M1000 Idiopathic gout, unspecified site: Secondary | ICD-10-CM | POA: Diagnosis not present

## 2016-04-06 DIAGNOSIS — R413 Other amnesia: Secondary | ICD-10-CM

## 2016-04-06 DIAGNOSIS — N182 Chronic kidney disease, stage 2 (mild): Secondary | ICD-10-CM

## 2016-04-06 NOTE — Assessment & Plan Note (Signed)
07/09/15 Na 137. K 4.3, Bun 32, creat 1.09. Continue Furosemide and Spironolactone. No apparent edema in BLE. Weights stable.

## 2016-04-06 NOTE — Assessment & Plan Note (Signed)
06/16/15 TSH 1.86 12/31/15 TSH 4.24 Continue Levothyroxine 173mcg

## 2016-04-06 NOTE — Assessment & Plan Note (Signed)
Rate controlled, continue Metoprolol 100mg  bid. Eliquis for VTE risk reduction.

## 2016-04-06 NOTE — Assessment & Plan Note (Signed)
compensated clinically, continue Furosemide 40mg  qd, prn Furosemide. Spironolactone 25mg . 12/31/15 wbc 6.2, Hgb 16.2, plt 184, Na 135, K 4.6, Bun 28, creat 1.43, TP 6.9, albumin 4.1, uric acid 8.2, TSH 4.24, Hgb a1c 6.3, BNP 254.1

## 2016-04-06 NOTE — Assessment & Plan Note (Signed)
Stable, off acid reducer 

## 2016-04-06 NOTE — Assessment & Plan Note (Addendum)
Stable, Continue Lorazepam 0.25mg  qhs and prn

## 2016-04-06 NOTE — Assessment & Plan Note (Signed)
Stable, functioning well in AL

## 2016-04-06 NOTE — Assessment & Plan Note (Signed)
Controlled, continue Metoprolol 100 mg twice a day, Losartan 50mg , Furosemide 40mg  qd, Spironolactone 25mg . 12/31/15 wbc 6.2, Hgb 16.2, plt 184, Na 135, K 4.6, Bun 28, creat 1.43, TP 6.9, albumin 4.1, uric acid 8.2, TSH 4.24, Hgb a1c 6.3, BNP 254.1

## 2016-04-06 NOTE — Assessment & Plan Note (Signed)
Stable, continue Benefiber and daily MiraLax

## 2016-04-06 NOTE — Assessment & Plan Note (Signed)
Ambulates with walker, w/c to go further. Tylenol 325mg daily 

## 2016-04-06 NOTE — Assessment & Plan Note (Signed)
No flare ups. continue Allopurinol 50mg daily.  

## 2016-04-06 NOTE — Assessment & Plan Note (Signed)
12/31/15 wbc 6.2, Hgb 16.2, plt 184, Na 135, K 4.6, Bun 28, creat 1.43, TP 6.9, albumin 4.1, uric acid 8.2, TSH 4.24, Hgb a1c 6.3, BNP 254.1

## 2016-04-06 NOTE — Assessment & Plan Note (Signed)
urinary frequency at night, continue Tamsulosin 0.4mg 

## 2016-04-06 NOTE — Progress Notes (Signed)
Location:  [facility] Room Number: [place] of Service:  ALF [phone]) Provider:  Demondre Aguas, Manxie  NP Jeanmarie Hubert, MD  Patient Care Team: [name], MD as PCP - General (Internal Medicine) [facility] Pixie Casino, MD as Consulting Physician (Cardiology) Ardis Hughs, MD as Attending Physician (Urology)  Extended Emergency Contact Information Primary Emergency Contact: [name] of [place] Phone: [phone] Relation: Daughter Secondary Emergency Contact: Eyvonne Mechanic [place] of [place] Phone: [phone] Mobile Phone: [phone] Relation: Daughter  Code Status:  Full Code Goals of care: Advanced Directive information Advanced Directives 04/06/2016  Does Patient Have a Medical Advance Directive? Yes  Type of Paramedic of [place];Living will;Out of facility DNR (pink MOST or yellow form)  Does patient want to make changes to medical advance directive? No - Patient declined  Copy of [place] in Chart? Yes  Pre-existing out of facility DNR order (yellow form or pink MOST form) Yellow form placed in chart (order not valid for inpatient use)     Chief Complaint  Patient presents with  . Medical Management of Chronic Issues    HPI:  Pt is a 83 y.o. male seen today for medical management of chronic diseases.    Hx of hypothyroidism, takingLevothyroxine 134mg, TSH 4.24 12/31/15 CVA, CHF compensated clinically, HTN, controlled on Losartan '25mg'$ , anxiety, prn Ativan is adequate,  urinary frequency, managed with Flomax. CHF: less SOB, dry rales back of lungs, weight stable, on Furosemide '40mg'$ , Spironolactone '25mg'$  daily. BPH: no urinary retention, but dribbling, taking Tamsulosin 0.'4mg'$  daily. Gout: no flare ups, taking Allopurinol '50mg'$ . Afib/late effect of CVA: heart rate is in control, taking Metoprolol '100mg'$  bid,  left facial weakness, ambulates with  walker, taking Eliquis  Past Medical History:  Diagnosis Date  . Abnormal PFTs 10/30/2012   Followed in Pulmonary clinic/ [facility]/ [place]  - PFT's 10/22/12 VC  55% no obst, DLCO 50%  - PFT's 12/20/2012 VC 83% and no obst, dLCO 55%  -11/08/2012  Walked RA x 3 laps @ 185 ft each stopped due to  End of study, not desat -11/08/12 esr 10    . Abnormality of gait 04/18/2013  . Acute on chronic systolic congestive heart failure, NYHA class 2 ([place] 03/16/2006   BNP 376.9 05/28/13   . Adjustment disorder with mixed anxiety and depressed mood 04/18/2013   Post CVA depression and anxiety   . Allergy   . Anxiety   . Aortic aneurysm of unspecified site without mention of rupture 10/27/2011  . Arthritis   . Atherosclerosis of renal artery ([place] 10/18/2006  . Atrial fibrillation ([place] 03/04/2011    He was on coumadin until his stroke last December and was switched to apixaban 2.'5mg'$  daily which he has been taking faithfully without reported side effects.    . Balance disorder 07/31/2014  . Basal cell carcinoma of skin of other and unspecified parts of face 12/24/2009  . Blood transfusion without reported diagnosis   . Cardiac pacemaker in situ- MDT 10/11 03/18/2010   Medtronic revo implant in October 2011. Severe sinus bradycardia in the 30s, but not truly pacemaker dependent   . Cataract   . CKD stage 2 due to type 2 diabetes mellitus ([place] 07/03/2014  . Coronary artery disease   . CVA - Rt brain stroke 12/14 02/18/2013   02/19/13 angiography CT head:  Diffuse atherosclerotic irregularity and plaque formation of the distal right common carotid artery  and proximal internal carotid artery without significant stenosis. Plaque ulceration is present and is a source of emboli. A small right thalamic CVA    . DM (diabetes mellitus), type 2 with renal complications ([place]) 2/83/6629  . Dyslipidemia 11/03/2004  . Fall 03/04/2011  . Fatigue 04/18/2013  . Gout, unspecified 11/03/2004  . Heart murmur   . History of  abdominal aortic aneurysm   . History of Doppler ultrasound 05/22/2012   LEAs; R anterior tibial artery appeared occluded; L posterior tibial shows short segment of occlusive ds  . History of Doppler ultrasound 05/22/2012   Abdominal Aortic Doppler; slight increase in fusiform aneurysm   . History of echocardiogram 12/2008   EF >55%; mild concentric LVH; mild MR; mild-mod TR; mild AV regurg;   . History of nuclear stress test 12/2010   lexiscan; low risk; compared to prior study, perfusion improved  . HTN (hypertension) 03/04/2011  . Hyperlipidemia   . Hypertension   . Hypertrophy of prostate with urinary obstruction and other lower urinary tract symptoms (LUTS) 04/28/2004  . Hyponatremia 03/04/2011  . Hypothyroidism 03/04/2011  . Impotence of organic origin 10/03/2000  . Internal hemorrhoids without mention of complication 47/65/4650  . Long term (current) use of anticoagulants 03/17/2011  . Major depressive disorder, single episode, unspecified 03/05/2009  . Memory change 01/24/2013  . Muscle weakness (generalized) 03/10/2011  . Nocturia 10/27/2011  . Obstructive sleep apnea (adult) (pediatric) 07/23/2008  . Open wound of knee, leg (except thigh), and ankle, complicated 3/54/6568  . Osteoarthritis of both knees 07/31/2014  . Osteoarthrosis, unspecified whether generalized or localized, unspecified site 02/05/2003  . PAF (paroxysmal atrial fibrillation) (HCC)    coumadin  . Pain in joint, lower leg 08/04/2011  . Pruritic condition 01/24/2013  . Quadriceps weakness 07/31/2014  . Right bundle branch block 04/19/2006  . 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 Spinal stenosis in cervical region 12/15/2004  . SSS (sick sinus syndrome) ([place]) 04/08/2014  . Stroke ([place])   . Thyroid disease   . TIA (transient ischemic attack) 04/30/2013  . Type II or unspecified type diabetes mellitus without mention of complication, not stated as uncontrolled 11/24/2004   . Unspecified vitamin D deficiency 10/18/2006  . 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 Increased exposure of the right cornea.    Past Surgical History:  Procedure Laterality Date  . CORONARY ANGIOPLASTY WITH STENT PLACEMENT  2008   stent to SVG to OM  . CORONARY ARTERY BYPASS GRAFT  1989  . EYE SURGERY    . HERNIA REPAIR    . INSERT / REPLACE / REMOVE PACEMAKER    . JOINT REPLACEMENT    . PACEMAKER INSERTION  2011    Allergies  Allergen Reactions  . Quinolones Other (See Comments)    Risk of rupture of AAA  . Caduet [Amlodipine-Atorvastatin] Other (See Comments)    Weakness   . Crestor [Rosuvastatin] Other (See Comments)    Muscle pain/weakness  . Lipitor [Atorvastatin Calcium] Other (See Comments)    Muscle pain/weakness  . Simvastatin Other (See Comments)    Muscle pain/weakness  . Lisinopril Other (See Comments)    Doesn't remember reaction    Allergies as of 04/06/2016      Reactions   Quinolones Other (See Comments)   Risk of rupture of AAA   Caduet [amlodipine-atorvastatin] Other (See Comments)   Weakness   Crestor [rosuvastatin] Other (See Comments)  Muscle pain/weakness   Lipitor [atorvastatin Calcium] Other (See Comments)   Muscle pain/weakness   Simvastatin Other (See Comments)   Muscle pain/weakness   Lisinopril Other (See Comments)   Doesn't remember reaction      Medication List       Accurate as of 04/06/16  1:28 PM. Always use your most recent med list.          acetaminophen 325 MG tablet Commonly known as:  TYLENOL Take 325 mg by mouth daily.   allopurinol 100 MG tablet Commonly known as:  ZYLOPRIM Take 50 mg by mouth daily.   amoxicillin 500 MG capsule Commonly known as:  AMOXIL Take 500 mg by mouth. Take 4 caps prior to dental procedures   apixaban 2.5 MG Tabs tablet Commonly known as:  ELIQUIS Take 1 tablet (2.5 mg total) by mouth 2 (two) times daily.   aspirin EC 81 MG tablet Take 81 mg by mouth  daily.   BIOFREEZE 4 % Gel Generic drug:  Menthol (Topical Analgesic) Apply topically. Apply to left knee four times daily as needed for pain   BOOST COMPACT Liqd Take 1 Container by mouth daily. 237 mls daily   carboxymethylcellulose 0.5 % Soln Commonly known as:  REFRESH PLUS Place 1 drop into both eyes every evening.   furosemide 40 MG tablet Commonly known as:  LASIX Take 40 mg by mouth daily. Take one tablet as needed if weight gain 3  Lbs in 24 hours or 5 lbs in a week   hydrocortisone 1 % Crea Commonly known as:  PROCTOCORT Apply twice daily as needed to relieve hemorrhoid discomfort   ipratropium-albuterol 0.5-2.5 (3) MG/3ML Soln Commonly known as:  DUONEB Take 3 mLs by nebulization 4 (four) times daily as needed.   levothyroxine 125 MCG tablet Commonly known as:  SYNTHROID, LEVOTHROID Take 125 mcg by mouth daily before breakfast.   LORazepam 0.5 MG tablet Commonly known as:  ATIVAN Take 0.25 mg by mouth. Take 1/2 tablet once a day; take 1/2 tablet every four hours as needed   losartan 25 MG tablet Commonly known as:  COZAAR Take 1 tablet (25 mg total) by mouth daily.   meclizine 25 MG tablet Commonly known as:  ANTIVERT Take 25 mg by mouth. Take one tablet every 6 hours as needed for dizziness   metoprolol 100 MG tablet Commonly known as:  LOPRESSOR Take 100 mg by mouth 2 (two) times daily.   nitroGLYCERIN 0.4 MG SL tablet Commonly known as:  NITROSTAT Place 0.4 mg under the tongue every 5 (five) minutes as needed for chest pain.   olopatadine 0.1 % ophthalmic solution Commonly known as:  PATANOL Place 1 drop into both eyes 2 (two) times daily. As needed for itching/irritation.   OXYGEN Inhale 2 L/min into the lungs as needed.   polyethylene glycol packet Commonly known as:  MIRALAX / GLYCOLAX Take 17 g by mouth daily.   polyvinyl alcohol 1.4 % ophthalmic solution Commonly known as:  LIQUIFILM TEARS Place 1 drop into both eyes. One drop in both  eyes twice daily as needed for dry eyes   protein supplement Powd Take 1 scoop by mouth daily as needed.   spironolactone 25 MG tablet Commonly known as:  ALDACTONE Take 25 mg by mouth daily.   tamsulosin 0.4 MG Caps capsule Commonly known as:  FLOMAX Take one cap by mouth daily   tobramycin-dexamethasone ophthalmic ointment Commonly known as:  TOBRADEX Place 1 application into both eyes 2 (two)  times daily.   Vitamin D 400 units capsule Take 400 Units by mouth 2 (two) times daily.       Review of Systems  Constitutional: Negative for fever and unexpected weight change.       Generalized weakness  HENT: Positive for hearing loss and rhinorrhea. Negative for congestion, ear discharge, ear pain and tinnitus.        Left periorbital swelling and discomfort.  Eyes:       Chronic irritation of the right eye due to ectropion and failure of the lids to close during the day  Respiratory: Positive for cough and shortness of breath. Negative for wheezing.   Cardiovascular: Positive for leg swelling (sometimes, not apparent today. ). Negative for palpitations.       Trace edema BLE, R>L  Gastrointestinal: Negative for abdominal pain and diarrhea.       Hx o dysphagia since his CVA. He thinks this has improved. Episodes of fecal incontinence. Hx of hemorrhoids.A tender Pilonidal cyst like growth at the right side of anus. Increased gas and fecal passage sometimes when his bladder moves.  Genitourinary: Positive for frequency and urgency.       Incontinent from time to time. Followed by Dr. Matilde Sprang.  Musculoskeletal: Positive for back pain. Negative for neck pain.  Skin: Negative for rash.       History of itching in the groin, which is chronic secondary to irritation from urinary incontinence, fecal incontinence, and adult diapering. Ulceration right medial malleolus since mid August 2017.  Neurological: Positive for weakness.       Thalamic CVA 02/18/2013. Residual right side  weakness, slurred speech, and depression. Recurrent CVA on 04/30/13 of the left MCA area Right facial weakness  Psychiatric/Behavioral: The patient is not nervous/anxious.     Immunization History  Administered Date(s) Administered  . Influenza Whole 12/06/2011, 12/19/2012  . Influenza-Unspecified 01/09/2014, 11/25/2014, 12/23/2015  . PPD Test 05/03/2013  . Pneumococcal Polysaccharide-23 03/07/1986  . Td 04/20/2005   Pertinent  Health Maintenance Due  Topic Date Due  . OPHTHALMOLOGY EXAM  01/12/1932  . PNA vac Low Risk Adult (1 of 2 - PCV13) 01/12/1989  . FOOT EXAM  07/31/2015  . HEMOGLOBIN A1C  06/30/2016  . INFLUENZA VACCINE  Completed   Fall Risk  10/15/2015 06/11/2015 10/23/2014 07/07/2014 02/17/2014  Falls in the past year? No Yes Yes Yes Exclusion - non ambulatory  Number falls in past yr: - '1 1 1 '$ -  Injury with Fall? - Yes - No -  Risk for fall due to : - - - History of fall(s);Impaired mobility;Impaired balance/gait -  Follow up - - - Falls evaluation completed;Falls prevention discussed -   Functional Status Survey:    Vitals:   04/06/16 1204  BP: 125/78  Pulse: 70  Resp: 20  Temp: 97.4 F (36.3 C)  SpO2: 97%  Weight: 160 lb 12.8 oz (72.9 kg)  Height: '5\' 8"'$  (1.727 m)   Body mass index is 24.45 kg/m. Physical Exam  Constitutional: He is oriented to person, place, and time. He appears well-developed and well-nourished. No distress.  HENT:  Severe hearing loss. Bilateral aides. Right facial droop.   Eyes:  Corrective lenses. Increased exposure of the right cornea due to post CVA facial droop. Irregular pupil on the left. Left periorbital swelling, increased clear drainage.  Neck: No JVD present. No tracheal deviation present. No thyromegaly present.  Cardiovascular: Normal rate and normal heart sounds.  Frequent extrasystoles are present.  No murmur heard.  Pacemaker left upper chest  Pulmonary/Chest: Effort normal. He has no wheezes. He has rales.  dry rales  posterior lung bases.   Abdominal: Soft. Bowel sounds are normal. He exhibits no distension and no mass. There is no tenderness.  Genitourinary:  Genitourinary Comments: .A tender Pilonidal cyst like growth at the right side of anus  Musculoskeletal: Normal range of motion. He exhibits edema. He exhibits no tenderness.  Unstable gait. Using 4 wheel walker and w/c. Weaker on the right side. Hx of the right total knee replacement. Trace edema BLE, R>L. C/o left knee pain, f/u Ortho. Motorized w/c to go further  Lymphadenopathy:    He has no cervical adenopathy.  Neurological: He is alert and oriented to person, place, and time. He displays normal reflexes. No cranial nerve deficit. He exhibits normal muscle tone. Coordination normal.  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 Recurrent CVA on 04/30/13 of the left MCA area Right facial weakness. Facial asymmetry is less evident today.  Skin: Skin is warm and dry. No rash noted. No erythema. No pallor.  2 mm ulceration of the right medial malleolus. Slight surrounding erythema. Quite tender to touch. Shallow scab on top of it.  Psychiatric: He has a normal mood and affect. His behavior is normal. Judgment and thought content normal.  Nursing note and vitals reviewed.   Labs reviewed:  Recent Labs  07/09/15 12/31/15 01/26/16  NA 137 135* 135*  K 4.3 4.6 4.6  BUN 32* 28* 26*  CREATININE 1.1 1.4* 1.2    Recent Labs  07/02/15 12/31/15 01/26/16  AST '27 20 25  '$ ALT '25 13 17  '$ ALKPHOS 100 80 74    Recent Labs  06/02/15 12/31/15 01/26/16  WBC 6.9 6.2 6.8  HGB 15.8 16.2 16.1  HCT 46 47 47  PLT 326 184 171   Lab Results  Component Value Date   TSH 4.24 12/31/2015   Lab Results  Component Value Date   HGBA1C 6.3 12/31/2015   Lab Results  Component Value Date   CHOL 117 05/01/2013   HDL 30 (L) 05/01/2013   LDLCALC 71 05/01/2013   TRIG 79 05/01/2013   CHOLHDL 3.9 05/01/2013     Significant Diagnostic Results in last 30 days:  No results found.  Assessment/Plan Atrial fibrillation (HCC) Rate controlled, continue Metoprolol '100mg'$  bid. Eliquis for VTE risk reduction.     HTN (hypertension) Controlled, continue Metoprolol 100 mg twice a day, Losartan '50mg'$ , Furosemide '40mg'$  qd, Spironolactone '25mg'$ . 12/31/15 wbc 6.2, Hgb 16.2, plt 184, Na 135, K 4.6, Bun 28, creat 1.43, TP 6.9, albumin 4.1, uric acid 8.2, TSH 4.24, Hgb a1c 6.3, BNP 254.1   Depression due to stroke (HCC) Stable, Continue Lorazepam 0.'25mg'$  qhs and prn   Chronic systolic congestive heart failure ([place]) compensated clinically, continue Furosemide '40mg'$  qd, prn Furosemide. Spironolactone '25mg'$ . 12/31/15 wbc 6.2, Hgb 16.2, plt 184, Na 135, K 4.6, Bun 28, creat 1.43, TP 6.9, albumin 4.1, uric acid 8.2, TSH 4.24, Hgb a1c 6.3, BNP 254.1  GERD (gastroesophageal reflux disease) Stable, off acid reducer   Constipation Stable, continue Benefiber and daily MiraLax   Hypothyroidism 06/16/15 TSH 1.86 12/31/15 TSH 4.24 Continue Levothyroxine 150mg   CKD stage 2 due to type 2 diabetes mellitus ([place] 12/31/15 wbc 6.2, Hgb 16.2, plt 184, Na 135, K 4.6, Bun 28, creat 1.43, TP 6.9, albumin 4.1, uric acid 8.2, TSH 4.24, Hgb a1c 6.3, BNP 254.1  Osteoarthritis Ambulates with walker, w/c to go further.  Tylenol '325mg'$  daily  Enlarged prostate with lower urinary tract symptoms (LUTS) urinary frequency at night, continue Tamsulosin 0.'4mg'$   Edema 07/09/15 Na 137. K 4.3, Bun 32, creat 1.09. Continue Furosemide and Spironolactone. No apparent edema in BLE. Weights stable.   Gout No flare ups. continue Allopurinol '50mg'$  daily.   Memory change Stable, functioning well in AL     Family/ staff Communication: AL  Labs/tests ordered:  none

## 2016-04-07 ENCOUNTER — Telehealth: Payer: Self-pay | Admitting: Cardiology

## 2016-04-07 ENCOUNTER — Ambulatory Visit (INDEPENDENT_AMBULATORY_CARE_PROVIDER_SITE_OTHER): Payer: Medicare Other | Admitting: *Deleted

## 2016-04-07 DIAGNOSIS — I495 Sick sinus syndrome: Secondary | ICD-10-CM

## 2016-04-07 NOTE — Telephone Encounter (Signed)
Spoke with pt and reminded pt of remote transmission that is due today. Pt verbalized understanding.   

## 2016-04-07 NOTE — Progress Notes (Signed)
Remote pacemaker transmission.   

## 2016-04-13 ENCOUNTER — Encounter: Payer: Self-pay | Admitting: Cardiology

## 2016-04-16 LAB — CUP PACEART REMOTE DEVICE CHECK
Implantable Lead Implant Date: 20111021
Implantable Lead Location: 753859
Implantable Pulse Generator Implant Date: 20111021
Lead Channel Impedance Value: 416 Ohm
Lead Channel Impedance Value: 440 Ohm
Lead Channel Setting Pacing Amplitude: 2.5 V
Lead Channel Setting Pacing Pulse Width: 0.4 ms
Lead Channel Setting Sensing Sensitivity: 0.9 mV
MDC IDC LEAD IMPLANT DT: 20111021
MDC IDC LEAD LOCATION: 753860
MDC IDC MSMT BATTERY VOLTAGE: 2.92 V
MDC IDC SESS DTM: 20180201204718
MDC IDC STAT BRADY RV PERCENT PACED: 86.03 %

## 2016-04-22 ENCOUNTER — Telehealth: Payer: Self-pay | Admitting: Internal Medicine

## 2016-04-22 NOTE — Telephone Encounter (Signed)
Called the patient twice today and left a message to call me back to scheduled his 6 month followup appointment.

## 2016-05-02 ENCOUNTER — Other Ambulatory Visit: Payer: Self-pay | Admitting: Internal Medicine

## 2016-05-02 DIAGNOSIS — I714 Abdominal aortic aneurysm, without rupture, unspecified: Secondary | ICD-10-CM

## 2016-05-04 ENCOUNTER — Encounter: Payer: Self-pay | Admitting: Nurse Practitioner

## 2016-05-04 ENCOUNTER — Non-Acute Institutional Stay: Payer: Medicare Other | Admitting: Nurse Practitioner

## 2016-05-04 DIAGNOSIS — I10 Essential (primary) hypertension: Secondary | ICD-10-CM | POA: Diagnosis not present

## 2016-05-04 DIAGNOSIS — I480 Paroxysmal atrial fibrillation: Secondary | ICD-10-CM | POA: Diagnosis not present

## 2016-05-04 DIAGNOSIS — K5900 Constipation, unspecified: Secondary | ICD-10-CM

## 2016-05-04 DIAGNOSIS — I5022 Chronic systolic (congestive) heart failure: Secondary | ICD-10-CM

## 2016-05-04 DIAGNOSIS — N182 Chronic kidney disease, stage 2 (mild): Secondary | ICD-10-CM

## 2016-05-04 DIAGNOSIS — Z7901 Long term (current) use of anticoagulants: Secondary | ICD-10-CM | POA: Diagnosis not present

## 2016-05-04 DIAGNOSIS — IMO0002 Reserved for concepts with insufficient information to code with codable children: Secondary | ICD-10-CM

## 2016-05-04 DIAGNOSIS — E1122 Type 2 diabetes mellitus with diabetic chronic kidney disease: Secondary | ICD-10-CM

## 2016-05-04 DIAGNOSIS — M159 Polyosteoarthritis, unspecified: Secondary | ICD-10-CM

## 2016-05-04 DIAGNOSIS — M150 Primary generalized (osteo)arthritis: Secondary | ICD-10-CM

## 2016-05-04 DIAGNOSIS — N401 Enlarged prostate with lower urinary tract symptoms: Secondary | ICD-10-CM | POA: Diagnosis not present

## 2016-05-04 DIAGNOSIS — I639 Cerebral infarction, unspecified: Secondary | ICD-10-CM

## 2016-05-04 DIAGNOSIS — E039 Hypothyroidism, unspecified: Secondary | ICD-10-CM

## 2016-05-04 DIAGNOSIS — K219 Gastro-esophageal reflux disease without esophagitis: Secondary | ICD-10-CM

## 2016-05-04 DIAGNOSIS — F329 Major depressive disorder, single episode, unspecified: Secondary | ICD-10-CM

## 2016-05-04 DIAGNOSIS — R609 Edema, unspecified: Secondary | ICD-10-CM

## 2016-05-04 DIAGNOSIS — E559 Vitamin D deficiency, unspecified: Secondary | ICD-10-CM

## 2016-05-04 DIAGNOSIS — M1000 Idiopathic gout, unspecified site: Secondary | ICD-10-CM

## 2016-05-04 DIAGNOSIS — F0631 Mood disorder due to known physiological condition with depressive features: Secondary | ICD-10-CM

## 2016-05-04 NOTE — Assessment & Plan Note (Signed)
compensated clinically, continue Furosemide 40mg  qd, prn Furosemide. Spironolactone 25mg , f/u Cardiology, last visit was 04/07/16

## 2016-05-04 NOTE — Assessment & Plan Note (Signed)
Controlled, continue Metoprolol 100 mg bid, Losartan 50mg, Furosemide 40mg qd, Spironolactone 25mg. 

## 2016-05-04 NOTE — Assessment & Plan Note (Signed)
Stable, continue daily MiraLax.  

## 2016-05-04 NOTE — Assessment & Plan Note (Signed)
Stable, Continue Lorazepam 0.25mg  qd and q4h prn

## 2016-05-04 NOTE — Assessment & Plan Note (Signed)
Continue Vit D 400u bid, update Vit D level.

## 2016-05-04 NOTE — Assessment & Plan Note (Signed)
Rate controlled, continue Metoprolol 100mg  bid. Eliquis and ASA 81mg  for VTE risk reduction.

## 2016-05-04 NOTE — Assessment & Plan Note (Signed)
urinary frequency at night, continue Tamsulosin 0.4mg 

## 2016-05-04 NOTE — Assessment & Plan Note (Addendum)
Stable, off acid reducer, update CBC Fe B12

## 2016-05-04 NOTE — Assessment & Plan Note (Signed)
No flare ups. continue Allopurinol 50mg daily.  

## 2016-05-04 NOTE — Assessment & Plan Note (Signed)
Ambulates with walker, w/c to go further. Tylenol 325mg daily 

## 2016-05-04 NOTE — Assessment & Plan Note (Signed)
Continue Furosemide and Spironolactone. No apparent edema in BLE. Weights stable.  

## 2016-05-04 NOTE — Assessment & Plan Note (Signed)
Creat 1.18 01/26/16, update CMP

## 2016-05-04 NOTE — Assessment & Plan Note (Signed)
Stable, Continue Lorazepam 0.25mg  qhs and prn q4h

## 2016-05-04 NOTE — Assessment & Plan Note (Signed)
Elliquis and ASA for Afib, s/p CVA 

## 2016-05-04 NOTE — Progress Notes (Signed)
Location:  [place] Room Number: [place] of Service:  ALF [phone]) Provider:  Camdyn Laden, [place]   NP  Jeanmarie Hubert, MD  Patient Care Team: [name], MD as PCP - General (Internal Medicine) [facility] Pixie Casino, MD as Consulting Physician (Cardiology) Ardis Hughs, MD as Attending Physician (Urology)  Extended Emergency Contact Information Primary Emergency Contact: [name] of [place] Phone: [phone] Relation: Daughter Secondary Emergency Contact: Eyvonne Mechanic [place] of [place] Phone: [phone] Mobile Phone: [phone] Relation: Daughter  Code Status:  Full Code  Goals of care: Advanced Directive information Advanced Directives 05/04/2016  Does Patient Have a Medical Advance Directive? Yes  Type of Paramedic of [place];Living will;Out of facility DNR (pink MOST or yellow form)  Does patient want to make changes to medical advance directive? No - Patient declined  Copy of [place] in Chart? Yes  Pre-existing out of facility DNR order (yellow form or pink MOST form) Yellow form placed in chart (order not valid for inpatient use)     Chief Complaint  Patient presents with  . Medical Management of Chronic Issues    HPI:  Pt is a 83 y.o. male seen today for medical management of chronic diseases.    Hx of hypothyroidism, taking Levothyroxine 180mg, TSH 4.24 12/31/15 CVA, CHF compensated clinically, HTN, controlled on Losartan '25mg'$ , anxiety, Ativan 0.'25mg'$  qd and q4h prn  is adequate,  urinary frequency, managed with Flomax. CHF: less SOB, dry rales back of lungs, weight stable, on Furosemide '40mg'$ , Spironolactone '25mg'$  daily. BPH: no urinary retention, but dribbling, taking Tamsulosin 0.'4mg'$  daily. Gout: no flare ups, taking Allopurinol '50mg'$ . Afib/late effect of CVA: heart rate is in control, taking Metoprolol '100mg'$  bid,  left facial  weakness, ambulates with walker, taking Eliquis and ASA '81mg'$  qd   Past Medical History:  Diagnosis Date  . Abnormal PFTs 10/30/2012   Followed in Pulmonary clinic/ [facility]/ [place]  - PFT's 10/22/12 VC  55% no obst, DLCO 50%  - PFT's 12/20/2012 VC 83% and no obst, dLCO 55%  -11/08/2012  Walked RA x 3 laps @ 185 ft each stopped due to  End of study, not desat -11/08/12 esr 10    . Abnormality of gait 04/18/2013  . Acute on chronic systolic congestive heart failure, NYHA class 2 ([place] 03/16/2006   BNP 376.9 05/28/13   . Adjustment disorder with mixed anxiety and depressed mood 04/18/2013   Post CVA depression and anxiety   . Allergy   . Anxiety   . Aortic aneurysm of unspecified site without mention of rupture 10/27/2011  . Arthritis   . Atherosclerosis of renal artery ([place] 10/18/2006  . Atrial fibrillation ([facility] 03/04/2011    He was on coumadin until his stroke last December and was switched to apixaban 2.'5mg'$  daily which he has been taking faithfully without reported side effects.    . Balance disorder 07/31/2014  . Basal cell carcinoma of skin of other and unspecified parts of face 12/24/2009  . Blood transfusion without reported diagnosis   . Cardiac pacemaker in situ- MDT 10/11 03/18/2010   Medtronic revo implant in October 2011. Severe sinus bradycardia in the 30s, but not truly pacemaker dependent   . Cataract   . CKD stage 2 due to type 2 diabetes mellitus ([facility] 07/03/2014  . Coronary artery disease   . CVA - Rt brain stroke 12/14 02/18/2013   02/19/13 angiography CT head:  Diffuse atherosclerotic irregularity and plaque formation of the distal right common carotid artery and proximal internal carotid artery without significant stenosis. Plaque ulceration is present and is a source of emboli. A small right thalamic CVA    . DM (diabetes mellitus), type 2 with renal complications ([place]) 1/76/1607  . Dyslipidemia 11/03/2004  . Fall 03/04/2011  . Fatigue 04/18/2013  . Gout, unspecified  11/03/2004  . Heart murmur   . History of abdominal aortic aneurysm   . History of Doppler ultrasound 05/22/2012   LEAs; R anterior tibial artery appeared occluded; L posterior tibial shows short segment of occlusive ds  . History of Doppler ultrasound 05/22/2012   Abdominal Aortic Doppler; slight increase in fusiform aneurysm   . History of echocardiogram 12/2008   EF >55%; mild concentric LVH; mild MR; mild-mod TR; mild AV regurg;   . History of nuclear stress test 12/2010   lexiscan; low risk; compared to prior study, perfusion improved  . HTN (hypertension) 03/04/2011  . Hyperlipidemia   . Hypertension   . Hypertrophy of prostate with urinary obstruction and other lower urinary tract symptoms (LUTS) 04/28/2004  . Hyponatremia 03/04/2011  . Hypothyroidism 03/04/2011  . Impotence of organic origin 10/03/2000  . Internal hemorrhoids without mention of complication 37/12/6267  . Long term (current) use of anticoagulants 03/17/2011  . Major depressive disorder, single episode, unspecified 03/05/2009  . Memory change 01/24/2013  . Muscle weakness (generalized) 03/10/2011  . Nocturia 10/27/2011  . Obstructive sleep apnea (adult) (pediatric) 07/23/2008  . Open wound of knee, leg (except thigh), and ankle, complicated 4/85/4627  . Osteoarthritis of both knees 07/31/2014  . Osteoarthrosis, unspecified whether generalized or localized, unspecified site 02/05/2003  . PAF (paroxysmal atrial fibrillation) (HCC)    coumadin  . Pain in joint, lower leg 08/04/2011  . Pruritic condition 01/24/2013  . Quadriceps weakness 07/31/2014  . Right bundle branch block 04/19/2006  . 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 Spinal stenosis in cervical region 12/15/2004  . SSS (sick sinus syndrome) ([place]) 04/08/2014  . Stroke ([place])   . Thyroid disease   . TIA (transient ischemic attack) 04/30/2013  . Type II or unspecified type diabetes mellitus without mention of  complication, not stated as uncontrolled 11/24/2004  . Unspecified vitamin D deficiency 10/18/2006  . 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 Increased exposure of the right cornea.    Past Surgical History:  Procedure Laterality Date  . CORONARY ANGIOPLASTY WITH STENT PLACEMENT  2008   stent to SVG to OM  . CORONARY ARTERY BYPASS GRAFT  1989  . EYE SURGERY    . HERNIA REPAIR    . INSERT / REPLACE / REMOVE PACEMAKER    . JOINT REPLACEMENT    . PACEMAKER INSERTION  2011    Allergies  Allergen Reactions  . Quinolones Other (See Comments)    Risk of rupture of AAA  . Caduet [Amlodipine-Atorvastatin] Other (See Comments)    Weakness   . Crestor [Rosuvastatin] Other (See Comments)    Muscle pain/weakness  . Lipitor [Atorvastatin Calcium] Other (See Comments)    Muscle pain/weakness  . Simvastatin Other (See Comments)    Muscle pain/weakness  . Lisinopril Other (See Comments)    Doesn't remember reaction    Allergies as of 05/04/2016      Reactions   Quinolones Other (See Comments)   Risk of rupture of AAA   Caduet [amlodipine-atorvastatin] Other (See  Comments)   Weakness   Crestor [rosuvastatin] Other (See Comments)   Muscle pain/weakness   Lipitor [atorvastatin Calcium] Other (See Comments)   Muscle pain/weakness   Simvastatin Other (See Comments)   Muscle pain/weakness   Lisinopril Other (See Comments)   Doesn't remember reaction      Medication List       Accurate as of 05/04/16  3:57 PM. Always use your most recent med list.          acetaminophen 325 MG tablet Commonly known as:  TYLENOL Take 325 mg by mouth daily.   allopurinol 100 MG tablet Commonly known as:  ZYLOPRIM Take 50 mg by mouth daily.   amoxicillin 500 MG capsule Commonly known as:  AMOXIL Take 500 mg by mouth. Take 4 caps prior to dental procedures   apixaban 2.5 MG Tabs tablet Commonly known as:  ELIQUIS Take 1 tablet (2.5 mg total) by mouth 2 (two) times daily.     aspirin EC 81 MG tablet Take 81 mg by mouth daily.   BIOFREEZE 4 % Gel Generic drug:  Menthol (Topical Analgesic) Apply topically. Apply to left knee four times daily as needed for pain   carboxymethylcellulose 0.5 % Soln Commonly known as:  REFRESH PLUS Place 1 drop into both eyes every evening.   furosemide 40 MG tablet Commonly known as:  LASIX Take 40 mg by mouth daily. Take one tablet as needed if weight gain 3  Lbs in 24 hours or 5 lbs in a week   hydrocortisone 1 % Crea Commonly known as:  PROCTOCORT Apply twice daily as needed to relieve hemorrhoid discomfort   ipratropium-albuterol 0.5-2.5 (3) MG/3ML Soln Commonly known as:  DUONEB Take 3 mLs by nebulization 4 (four) times daily as needed.   levothyroxine 125 MCG tablet Commonly known as:  SYNTHROID, LEVOTHROID Take 125 mcg by mouth daily before breakfast.   LORazepam 0.5 MG tablet Commonly known as:  ATIVAN Take 0.25 mg by mouth. Take 1/2 tablet once a day; take 1/2 tablet every four hours as needed   losartan 25 MG tablet Commonly known as:  COZAAR Take 1 tablet (25 mg total) by mouth daily.   meclizine 25 MG tablet Commonly known as:  ANTIVERT Take 25 mg by mouth. Take one tablet every 6 hours as needed for dizziness   metoprolol 100 MG tablet Commonly known as:  LOPRESSOR Take 100 mg by mouth 2 (two) times daily.   nitroGLYCERIN 0.4 MG SL tablet Commonly known as:  NITROSTAT Place 0.4 mg under the tongue every 5 (five) minutes as needed for chest pain.   olopatadine 0.1 % ophthalmic solution Commonly known as:  PATANOL Place 1 drop into both eyes 2 (two) times daily. As needed for itching/irritation.   OXYGEN Inhale 2 L/min into the lungs as needed.   polyethylene glycol packet Commonly known as:  MIRALAX / GLYCOLAX Take 17 g by mouth daily.   polyvinyl alcohol 1.4 % ophthalmic solution Commonly known as:  LIQUIFILM TEARS Place 1 drop into both eyes. One drop in both eyes twice daily as  needed for dry eyes   protein supplement Powd Take 1 scoop by mouth daily as needed.   spironolactone 25 MG tablet Commonly known as:  ALDACTONE Take 25 mg by mouth daily.   tamsulosin 0.4 MG Caps capsule Commonly known as:  FLOMAX Take one cap by mouth daily   tobramycin-dexamethasone ophthalmic ointment Commonly known as:  TOBRADEX Place 1 application into both eyes 2 (two)  times daily.   triamcinolone cream 0.1 % Commonly known as:  KENALOG Apply 1 application topically daily.   Vitamin D 400 units capsule Take 400 Units by mouth 2 (two) times daily.       Review of Systems  Constitutional: Negative for fever and unexpected weight change.       Generalized weakness  HENT: Positive for hearing loss and rhinorrhea. Negative for congestion, ear discharge, ear pain and tinnitus.        Left periorbital swelling and discomfort.  Eyes:       Chronic irritation of the right eye due to ectropion and failure of the lids to close during the day. Dry eyes  Respiratory: Positive for cough. Negative for shortness of breath and wheezing.   Cardiovascular: Positive for leg swelling (sometimes, not apparent today. ). Negative for palpitations.       Trace edema BLE, R>L  Gastrointestinal: Negative for abdominal pain and diarrhea.       Hx o dysphagia since his CVA. He thinks this has improved. Episodes of fecal incontinence. Hx of hemorrhoids.A tender Pilonidal cyst like growth at the right side of anus. Increased gas and fecal passage sometimes when his bladder moves.  Genitourinary: Positive for frequency and urgency.       Incontinent from time to time. Followed by Dr. Matilde Sprang.  Musculoskeletal: Positive for back pain. Negative for neck pain.  Skin: Negative for rash.       History of itching in the groin, which is chronic secondary to irritation from urinary incontinence, fecal incontinence, and adult diapering. Ulceration right medial malleolus since mid August 2017.   Neurological: Positive for weakness.       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 Recurrent CVA on 04/30/13 of the left MCA area Right facial weakness  Psychiatric/Behavioral: The patient is not nervous/anxious.     Immunization History  Administered Date(s) Administered  . Influenza Whole 12/06/2011, 12/19/2012  . Influenza-Unspecified 01/09/2014, 11/25/2014, 12/23/2015  . PPD Test 05/03/2013  . Pneumococcal Polysaccharide-23 03/07/1986  . Td 04/20/2005   Pertinent  Health Maintenance Due  Topic Date Due  . OPHTHALMOLOGY EXAM  01/12/1932  . PNA vac Low Risk Adult (1 of 2 - PCV13) 01/12/1989  . FOOT EXAM  07/31/2015  . HEMOGLOBIN A1C  06/30/2016  . INFLUENZA VACCINE  Completed   Fall Risk  10/15/2015 06/11/2015 10/23/2014 07/07/2014 02/17/2014  Falls in the past year? No Yes Yes Yes Exclusion - non ambulatory  Number falls in past yr: - '1 1 1 '$ -  Injury with Fall? - Yes - No -  Risk for fall due to : - - - History of fall(s);Impaired mobility;Impaired balance/gait -  Follow up - - - Falls evaluation completed;Falls prevention discussed -   Functional Status Survey:    Vitals:   05/04/16 1004  BP: 118/70  Pulse: 63  Resp: 20  Temp: 97.6 F (36.4 C)  SpO2: 96%  Weight: 160 lb (72.6 kg)  Height: '5\' 8"'$  (1.727 m)   Body mass index is 24.33 kg/m. Physical Exam  Constitutional: He is oriented to person, place, and time. He appears well-developed and well-nourished. No distress.  HENT:  Severe hearing loss. Bilateral aides. Right facial droop.   Eyes:  Corrective lenses. Increased exposure of the right cornea due to post CVA facial droop. Irregular pupil on the left. Left periorbital swelling, increased clear drainage.  Neck: No JVD present. No tracheal deviation present. No thyromegaly present.  Cardiovascular: Normal rate.  Frequent extrasystoles are present.  Murmur heard. Pacemaker left upper chest Systolic murmur.    Pulmonary/Chest: Effort normal. He has no wheezes. He has rales.  dry rales posterior lung bases.   Abdominal: Soft. Bowel sounds are normal. He exhibits no distension and no mass. There is no tenderness.  Genitourinary:  Genitourinary Comments: .A tender Pilonidal cyst like growth at the right side of anus  Musculoskeletal: Normal range of motion. He exhibits edema. He exhibits no tenderness.  Unstable gait. Using 4 wheel walker and w/c. Weaker on the right side. Hx of the right total knee replacement. Trace edema BLE, R>L. C/o left knee pain, f/u Ortho. Motorized w/c to go further  Lymphadenopathy:    He has no cervical adenopathy.  Neurological: He is alert and oriented to person, place, and time. He displays normal reflexes. No cranial nerve deficit. He exhibits normal muscle tone. Coordination normal.  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 Recurrent CVA on 04/30/13 of the left MCA area Right facial weakness. Facial asymmetry is less evident today.  Skin: Skin is warm and dry. No rash noted. No erythema. No pallor.  2 mm ulceration of the right medial malleolus. Slight surrounding erythema. Quite tender to touch. Shallow scab on top of it.  Psychiatric: He has a normal mood and affect. His behavior is normal. Judgment and thought content normal.  Nursing note and vitals reviewed.   Labs reviewed:  Recent Labs  07/09/15 12/31/15 01/26/16  NA 137 135* 135*  K 4.3 4.6 4.6  BUN 32* 28* 26*  CREATININE 1.1 1.4* 1.2    Recent Labs  07/02/15 12/31/15 01/26/16  AST '27 20 25  '$ ALT '25 13 17  '$ ALKPHOS 100 80 74    Recent Labs  06/02/15 12/31/15 01/26/16  WBC 6.9 6.2 6.8  HGB 15.8 16.2 16.1  HCT 46 47 47  PLT 326 184 171   Lab Results  Component Value Date   TSH 4.24 12/31/2015   Lab Results  Component Value Date   HGBA1C 6.3 12/31/2015   Lab Results  Component Value Date   CHOL 117 05/01/2013   HDL 30 (L) 05/01/2013    LDLCALC 71 05/01/2013   TRIG 79 05/01/2013   CHOLHDL 3.9 05/01/2013    Significant Diagnostic Results in last 30 days:  No results found.  Assessment/Plan Atrial fibrillation (HCC) Rate controlled, continue Metoprolol '100mg'$  bid. Eliquis and ASA '81mg'$  for VTE risk reduction.     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 Spironolactone '25mg'$ , f/u Cardiology, last visit was 04/07/16  GERD (gastroesophageal reflux disease) Stable, off acid reducer, update CBC Fe B12   Constipation Stable, continue daily MiraLax  Hypothyroidism 06/16/15 TSH 1.86 12/31/15 TSH 4.24 Continue Levothyroxine 137mg Update TSH, CBC, CMP, BNP  CKD stage 2 due to type 2 diabetes mellitus ([place] Creat 1.18 01/26/16, update CMP  Osteoarthritis Ambulates with walker, w/c to go further. Tylenol '325mg'$  daily  Enlarged prostate with lower urinary tract symptoms (LUTS) urinary frequency at night, continue Tamsulosin 0.'4mg'$    Long term current use of anticoagulant therapy Elliquis and ASA for Afib, s/p CVA  Edema Continue Furosemide and Spironolactone. No apparent edema in BLE. Weights stable.    Major depressive disorder, single episode  Stable, Continue Lorazepam 0.'25mg'$  qhs and prn q4h  Vitamin D deficiency Continue Vit D 400u bid, update Vit D level.   Gout No flare ups. continue Allopurinol '50mg'$  daily.       Family/ staff Communication: AL  Labs/tests ordered:  CBC CMP BNP TSH Vit D level, Fe, Vit B12

## 2016-05-04 NOTE — Assessment & Plan Note (Signed)
06/16/15 TSH 1.86 12/31/15 TSH 4.24 Continue Levothyroxine 129mcg Update TSH, CBC, CMP, BNP

## 2016-05-05 DIAGNOSIS — I10 Essential (primary) hypertension: Secondary | ICD-10-CM | POA: Diagnosis not present

## 2016-05-05 DIAGNOSIS — I5022 Chronic systolic (congestive) heart failure: Secondary | ICD-10-CM | POA: Diagnosis not present

## 2016-05-05 LAB — BASIC METABOLIC PANEL
BUN: 28 mg/dL — AB (ref 4–21)
Creatinine: 1.1 mg/dL (ref <=1.3)
Glucose: 105 mg/dL
Potassium: 4.8 mmol/L (ref 3.4–5.3)
Sodium: 135 mmol/L — AB (ref 137–147)

## 2016-05-05 LAB — HEPATIC FUNCTION PANEL
ALK PHOS: 98 U/L (ref 25–125)
ALT: 14 U/L (ref 10–40)
AST: 18 U/L (ref 14–40)
BILIRUBIN, TOTAL: 1 mg/dL

## 2016-05-05 LAB — CBC AND DIFFERENTIAL
HEMATOCRIT: 48 % (ref 41–53)
HEMOGLOBIN: 16.2 g/dL (ref 13.5–17.5)
PLATELETS: 195 10*3/uL (ref 150–399)
WBC: 6.4 10^3/mL

## 2016-05-05 LAB — VITAMIN D 25 HYDROXY (VIT D DEFICIENCY, FRACTURES): Vit D, 25-Hydroxy: 37

## 2016-05-09 ENCOUNTER — Ambulatory Visit (INDEPENDENT_AMBULATORY_CARE_PROVIDER_SITE_OTHER): Payer: Medicare Other | Admitting: Internal Medicine

## 2016-05-09 ENCOUNTER — Encounter: Payer: Self-pay | Admitting: Internal Medicine

## 2016-05-09 VITALS — BP 112/62 | HR 60 | Ht 68.0 in | Wt 160.8 lb

## 2016-05-09 DIAGNOSIS — I639 Cerebral infarction, unspecified: Secondary | ICD-10-CM

## 2016-05-09 DIAGNOSIS — I480 Paroxysmal atrial fibrillation: Secondary | ICD-10-CM

## 2016-05-09 DIAGNOSIS — R0602 Shortness of breath: Secondary | ICD-10-CM | POA: Diagnosis not present

## 2016-05-09 DIAGNOSIS — I495 Sick sinus syndrome: Secondary | ICD-10-CM

## 2016-05-09 DIAGNOSIS — Z950 Presence of cardiac pacemaker: Secondary | ICD-10-CM

## 2016-05-09 DIAGNOSIS — F0631 Mood disorder due to known physiological condition with depressive features: Secondary | ICD-10-CM

## 2016-05-09 NOTE — Patient Instructions (Signed)
DECREASE metoprolol to 50mg  twice daily  Dr. Debara Pickett said it is OK to increase vitamin D to 2000U daily  Your physician recommends that you schedule a follow-up appointment in: [place] with Dr. Debara Pickett

## 2016-05-10 DIAGNOSIS — H02135 Senile ectropion of left lower eyelid: Secondary | ICD-10-CM | POA: Diagnosis not present

## 2016-05-10 DIAGNOSIS — H02132 Senile ectropion of right lower eyelid: Secondary | ICD-10-CM | POA: Diagnosis not present

## 2016-05-10 NOTE — Progress Notes (Signed)
OFFICE NOTE  Chief Complaint:  Feels well but sleep a lot  Primary Care Physician: Jeanmarie Hubert, MD  HPI:  Jon [name]  is a 83 yo male formerly followed by Dr. Rex Kras and recently seen by Cecilie Kicks, NP, for left lower extremity edema with discoloration of his toes, was beginning to be uncomfortable for him to walk. We did venous Dopplers. He was quite concerned about arterial blood supply. His mother ended up with bilateral amputations. Venous Dopplers showed a ruptured baker cyst, no DVT. He was instructed on this and since that time his symptoms have resolved completely. He is on Coumadin for paroxysmal A-fib which led to the discoloration in his toes. Additionally, he has a history of abdominal aortic aneurysm and because he was concerned about his arterial disease, if he had it, we did Dopplers.  He is here for the results of the Dopplers. Bilateral ABIs were 1.0, though he does have appearance of an occluded right anterior tibial artery and a left posterior tib demonstrated a short segment of occlusive disease with reconstitution at the ankle. His pulses have been 2+. He has no claudication symptoms. Additionally, the duplex of his abdominal aorta was slightly, minimally changed from his last one. Previously it was 3.95 x 3.57, now he is 3.7 x 4.0, essentially the same, very slight increase. He has no abdominal pain and no other complaints. He has really no complaints today, feels quite well. He is not aware of any tachycardias or palpitations. He has a Medtronic pacemaker which was interrogated today. This demonstrates a battery voltage of 3.0V.  His a-fib burden is 1.6% and he is on amiodarone.  Other history includes bypass grafting in 1989; vein graft was stented in 2008. Last stress test was 2012, low risk study. EF was 49%. Pacemaker was placed for paroxysmal A-fib and bradycardia, sick sinus syndrome in 2011. He is also on warfarin.   Jon [name] underwent PFTs on 10/22/2012.  This showed a moderately severe restrictive limitation as well as moderately reduced diffusion capacity. Based on these findings I recommended discontinuing his amiodarone and he also stopped his pravastatin. Since that time he's noted that he feels quite a bit better including some minor improvement in his knee pain. He still feels like his left knee is bothering him and he is status post right TKR. 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 I did refer him to the [facility], but has not been contacted for appointment so far.  Since his last followup, Jon [name] was seen by Dr. Sallyanne Kuster for a device check in his device appeared to be working properly. He was having atrial fibrillation, but a little burden. He continued on warfarin which was generally therapeutic. Unfortunately, in December he had a thalamic stroke which resulted in some right facial droop and hemiparesis. That is improving , but slowly. He was then changed to Eliquis for anticoagulation. He was also started on Zetia, as he has a history of statin intolerance in the past.  An echo performed in the hospital demonstrated an EF of 40-45%, which is mildly reduced from previous studies. In addition, he has had a small amount of weight gain and lower extremity swelling which is noted over the past several weeks while in recovery.  Unfortunately, he suffered another stroke last month around the time that we plan to admit him for tikosyn induction. He is still trying to recover from that.   [name] appears euvolemic today. He does have a small amount of lower extremity swelling. His weight has been stable. He is reporting some epistaxis. He is concerned about the dose of his Eliquis. He is also reporting some urinary incontinence which is new. There is some flank pain and there is some possible concern for urinary tract infection however he is awaiting the results of a urinalysis.  Jon [name] was seen in the office today as  an add-on for worsening swelling and shortness of breath. I reviewed notes from [facility] which indicate in January because of excessive urination he requested to come off of his diuretics. He was taken off the diuretics but quickly developed swelling and worsening shortness of breath. A BNP was obtained which was greater than 300. He was restarted on his diuretics and had some improvement in his shortness of breath but still has persistent symptoms. He's also developed asymmetric right lower extremity edema. He underwent an in office Doppler ultrasound that was negative for DVT. Those results are communicated in the office notes, but no formal radiology report is available. It would be unlikely for him to have DVT given the fact that he's on Eliquis. He still has persistent right lower extremity edema. He reports shortness of breath, fatigue and difficulty speaking. He denies any chest pain.  He is also overdue for pacemaker check and that was performed in the office today under my supervision.  Jon [name] returns today in the office. He reports an improvement in his shortness of breath with an increased dose of Lasix. An echocardiogram was performed which shows preserved systolic function of the left ventricle however there is significant right heart dysfunction and severe dilatation of the right ventricle. This is likely the cause of his shortness of breath. He seems to respond and diaphoretic. He reports his fatigue is improving and is currently working with physical therapy.  I saw Jon [name] back today in the office. When I last saw him he had had some leg swelling and I recommended increasing his Lasix to 40 mg twice daily. Unfortunately due to problems with incontinence he did not make those changes. Over the past 3 months there is been about a 13 pound weight gain. He has significant leg swelling. This is likely due to some right heart failure symptoms which were suspected at his last office  visit. He does get a little more short of breath with exertion as well.  Jon [name] returns again today for close follow-up. He is now been taking Lasix 40 mg twice daily. His weight is actually down 3-4 pounds since his last office visit. He's been urinating quite a bit more. He did see Dr. Marlou Porch for a urology evaluation. His medicines were adjusted and although he was having some urinary retention now is having problems with incontinence. He appears to have had some improvement in his swelling and overall some mild improvement in shortness of breath however the other night was noted to be short of breath and hypoxic. He was placed on oxygen overnight. Is not clear whether he will need nocturnal oxygen however he does have a remote history of sleep apnea and has not used CPAP in some time. Labs in early October indicated elevated BNP of 350, but this is not been reassessed since diuresis.  Mr. Ashland reports some improvement in his symptoms. He's more energetic today to fact has had recent treatment of his knees with injections and therapy at [facility]. He says this has helped  significantly. He is walking and does appear somewhat brighter today. Generally his energy level is pretty good although he naps fairly regularly. His weight is been stable. He denies any worsening shortness of breath or chest pain. He just did a remote check and will be due to see Dr. Loletha Grayer back in the office in a few months. He is overdue for abdominal ultrasound as he had a small aortic aneurysm which was seen in 2014. He's also had carotid Dopplers which showed mild carotid disease.  11/03/2015  Mr. Rylee returns today generally without any complaints. He looks like he's gained a few pounds but does not appear volume overloaded. He denies any chest pain or worsening shortness of breath. Fortunately he has had no falls. He has been struggling with somewhat lower blood pressure. He saw Dr. Sallyanne Kuster back in May and his blood pressure  medication was decreased. He still has some problems with hypotension. He seems to be asymptomatic with this however there is concern about his risk for falls. His for his blood pressure medication he is on losartan and metoprolol. 8 check of his pacemaker shows a persistent atrial fibrillation. It is normally functioning. He recently had normalization of his LVEF. He denies any bleeding problems on Eliquis which she is taking for recent thalamic stroke in the setting of A. fib. He had Dopplers this year of his abdominal aortic aneurysm which shows stable findings.  05/09/2016  Mr. Wolbert returns today for follow-up. Overall he seems to be feeling well although he is sleeping quite a bit. He wondered if this could be due to vitamin D deficiency. His daughter thinks he should be on 2000 units of vitamin D a day versus 800 units. I don't see any problem with this although recently vitamin D level was appropriate at 59. He denies any chest pain any significant shortness of breath or worsening edema. Interestingly he recently has had remote pacemaker checks which show a frequency of pacing greater than 95%. He is on high-dose metoprolol 100 twice daily, therefore he may have some degree of medication related chronotropic incompetence.  PMHx:  Past Medical History:  Diagnosis Date  . Abnormal PFTs 10/30/2012   Followed in Pulmonary clinic/ [facility]/ [place]  - PFT's 10/22/12 VC  55% no obst, DLCO 50%  - PFT's 12/20/2012 VC 83% and no obst, dLCO 55%  -11/08/2012  Walked RA x 3 laps @ 185 ft each stopped due to  End of study, not desat -11/08/12 esr 10    . Abnormality of gait 04/18/2013  . Acute on chronic systolic congestive heart failure, NYHA class 2 ([employer]) 03/16/2006   BNP 376.9 05/28/13   . Adjustment disorder with mixed anxiety and depressed mood 04/18/2013   Post CVA depression and anxiety   . Allergy   . Anxiety   . Aortic aneurysm of unspecified site without mention of rupture 10/27/2011  . Arthritis     . Atherosclerosis of renal artery ([place]) 10/18/2006  . Atrial fibrillation ([place]) 03/04/2011    He was on coumadin until his stroke last December and was switched to apixaban 2.'5mg'$  daily which he has been taking faithfully without reported side effects.    . Balance disorder 07/31/2014  . Basal cell carcinoma of skin of other and unspecified parts of face 12/24/2009  . Blood transfusion without reported diagnosis   . Cardiac pacemaker in situ- MDT 10/11 03/18/2010   Medtronic revo implant in October 2011. Severe sinus bradycardia in the 30s, but not truly  pacemaker dependent   . Cataract   . CKD stage 2 due to type 2 diabetes mellitus ([place]) 07/03/2014  . Coronary artery disease   . CVA - Rt brain stroke 12/14 02/18/2013   02/19/13 angiography CT head:  Diffuse atherosclerotic irregularity and plaque formation of the distal right common carotid artery and proximal internal carotid artery without significant stenosis. Plaque ulceration is present and is a source of emboli. A small right thalamic CVA    . DM (diabetes mellitus), type 2 with renal complications ([place]) 10/14/9831  . Dyslipidemia 11/03/2004  . Fall 03/04/2011  . Fatigue 04/18/2013  . Gout, unspecified 11/03/2004  . Heart murmur   . History of abdominal aortic aneurysm   . History of Doppler ultrasound 05/22/2012   LEAs; R anterior tibial artery appeared occluded; L posterior tibial shows short segment of occlusive ds  . History of Doppler ultrasound 05/22/2012   Abdominal Aortic Doppler; slight increase in fusiform aneurysm   . History of echocardiogram 12/2008   EF >55%; mild concentric LVH; mild MR; mild-mod TR; mild AV regurg;   . History of nuclear stress test 12/2010   lexiscan; low risk; compared to prior study, perfusion improved  . HTN (hypertension) 03/04/2011  . Hyperlipidemia   . Hypertension   . Hypertrophy of prostate with urinary obstruction and other lower urinary tract symptoms (LUTS) 04/28/2004  . Hyponatremia  03/04/2011  . Hypothyroidism 03/04/2011  . Impotence of organic origin 10/03/2000  . Internal hemorrhoids without mention of complication 82/50/5397  . Long term (current) use of anticoagulants 03/17/2011  . Major depressive disorder, single episode, unspecified 03/05/2009  . Memory change 01/24/2013  . Muscle weakness (generalized) 03/10/2011  . Nocturia 10/27/2011  . Obstructive sleep apnea (adult) (pediatric) 07/23/2008  . Open wound of knee, leg (except thigh), and ankle, complicated 6/73/4193  . Osteoarthritis of both knees 07/31/2014  . Osteoarthrosis, unspecified whether generalized or localized, unspecified site 02/05/2003  . PAF (paroxysmal atrial fibrillation) (H[facility])    coumadin  . Pain in joint, lower leg 08/04/2011  . Pruritic condition 01/24/2013  . Quadriceps weakness 07/31/2014  . Right bundle branch block 04/19/2006  . 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 Spinal stenosis in cervical region 12/15/2004  . SSS (sick sinus syndrome) ([place]) 04/08/2014  . Stroke ([place])   . Thyroid disease   . TIA (transient ischemic attack) 04/30/2013  . Type II or unspecified type diabetes mellitus without mention of complication, not stated as uncontrolled 11/24/2004  . Unspecified vitamin D deficiency 10/18/2006  . 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 Increased exposure of the right cornea.     Past Surgical History:  Procedure Laterality Date  . CORONARY ANGIOPLASTY WITH STENT PLACEMENT  2008   stent to SVG to OM  . CORONARY ARTERY BYPASS GRAFT  1989  . EYE SURGERY    . HERNIA REPAIR    . INSERT / REPLACE / REMOVE PACEMAKER    . JOINT REPLACEMENT    . PACEMAKER INSERTION  2011    FAMHx:  Family History  Problem Relation Age of Onset  . Diabetes Father   . Other Mother     PAD with amputations    SOCHx:   reports that he quit smoking about 49 years ago. His smoking use included Cigarettes. He has a 11.25  pack-year smoking history. He has never used smokeless tobacco. He reports that he does not drink alcohol  or use drugs.  ALLERGIES:  Allergies  Allergen Reactions  . Quinolones Other (See Comments)    Risk of rupture of AAA  . Caduet [Amlodipine-Atorvastatin] Other (See Comments)    Weakness   . Crestor [Rosuvastatin] Other (See Comments)    Muscle pain/weakness  . Lipitor [Atorvastatin Calcium] Other (See Comments)    Muscle pain/weakness  . Simvastatin Other (See Comments)    Muscle pain/weakness  . Lisinopril Other (See Comments)    Doesn't remember reaction    ROS: Pertinent items noted in HPI and remainder of comprehensive ROS otherwise negative.  HOME MEDS: Current Outpatient Prescriptions  Medication Sig Dispense Refill  . acetaminophen (TYLENOL) 325 MG tablet Take 325 mg by mouth daily.     Marland Kitchen allopurinol (ZYLOPRIM) 100 MG tablet Take 50 mg by mouth daily.     Marland Kitchen amoxicillin (AMOXIL) 500 MG capsule Take 500 mg by mouth. Take 4 caps prior to dental procedures    . apixaban (ELIQUIS) 2.5 MG TABS tablet Take 1 tablet (2.5 mg total) by mouth 2 (two) times daily. 60 tablet 11  . aspirin EC 81 MG tablet Take 81 mg by mouth daily.    . carboxymethylcellulose (REFRESH PLUS) 0.5 % SOLN Place 1 drop into both eyes every evening.    . Cholecalciferol (VITAMIN D) 400 UNITS capsule Take 2,000 Units by mouth daily.     . furosemide (LASIX) 40 MG tablet Take 40 mg by mouth daily. Take one tablet as needed if weight gain 3  Lbs in 24 hours or 5 lbs in a week    . ipratropium (ATROVENT) 0.06 % nasal spray Place 2 sprays into both nostrils as directed. Uses 1-2x daily    . levothyroxine (SYNTHROID, LEVOTHROID) 125 MCG tablet Take 125 mcg by mouth daily before breakfast.    . LORazepam (ATIVAN) 0.5 MG tablet Take 0.25 mg by mouth. Take 1/2 tablet once a day; take 1/2 tablet every four hours as needed    . losartan (COZAAR) 25 MG tablet Take 1 tablet (25 mg total) by mouth daily. 90 tablet 3    . meclizine (ANTIVERT) 25 MG tablet Take 25 mg by mouth. Take one tablet every 6 hours as needed for dizziness    . Menthol, Topical Analgesic, (BIOFREEZE) 4 % GEL Apply topically. Apply to left knee four times daily as needed for pain    . metoprolol (LOPRESSOR) 100 MG tablet Take 50 mg by mouth 2 (two) times daily.     . nitroGLYCERIN (NITROSTAT) 0.4 MG SL tablet Place 0.4 mg under the tongue every 5 (five) minutes as needed for chest pain.     Marland Kitchen olopatadine (PATANOL) 0.1 % ophthalmic solution Place 1 drop into both eyes 2 (two) times daily as needed for allergies.    . polyethylene glycol (MIRALAX / GLYCOLAX) packet Take 17 g by mouth daily.    Marland Kitchen spironolactone (ALDACTONE) 25 MG tablet Take 25 mg by mouth daily.    . tamsulosin (FLOMAX) 0.4 MG CAPS capsule Take one cap by mouth daily    . tobramycin-dexamethasone (TOBRADEX) ophthalmic ointment Place 1 application into both eyes 2 (two) times daily.    Marland Kitchen triamcinolone cream (KENALOG) 0.1 % Apply 1 application topically daily.     No current facility-administered medications for this visit.     LABS/IMAGING: No results found for this or any previous visit (from the past 48 hour(s)). No results found.  VITALS: BP 112/62   Pulse 60   Ht '5\' 8"'$  (  1.727 m)   Wt 160 lb 12.8 oz (72.9 kg)   BMI 24.45 kg/m   EXAM: GEN: Awake, NAD HEENT: PERRLA, EOMI Neck: no JVP Cardiovascular: RRR Abdomen: Soft, nontender, mildly distended Extremities: 1+ bilateral pitting edema Skin: Warm, dry Neurologic: Grossly intact Psych: Pleasant  EKG: Ventricular paced rhythm at 62  ASSESSMENT: 1. Acute on chronic systolic, right sided- congestive heart failure, NYHA Class II symptoms - EF 55-60% 2. Persistent atrial fibrillation 3. Sick sinus syndrome status post pacemaker placement with normal function 4. Abnormal PFT's - off of amiodarone 5. History of abdominal aortic aneurysm with mild enlargement 6. Coronary artery disease status post CABG in  1989 with stent to the vein graft in 2008 7. Minimal PAD with normal ABIs bilaterally 8. Recent thalamic stroke - now on Eliquis 9. Epistaxis 10. Urinary incontinence 11. Mild bilateral carotid artery disease  PLAN: 1.   Mr. Lips continues to have sleepiness more than fatigue. This could be due to medications, particularly lorazepam which he uses to sleep at night. I would like to decrease his metoprolol tartrate to 50 mg twice daily to see if we can encourage less frequent pacing. He may need a small increase in losartan to compensate for any blood pressure change but will have a monitor that at friend's home. Finally I did increase his vitamin D to 2000 units. Although I don't think that this will help his symptoms much, I don't think it'll be harmful either.  Follow-up in 1 month.  Pixie Casino, MD, [facility] Attending Cardiologist [place] C Camdan Burdi 05/10/2016, 5:36 PM

## 2016-05-12 ENCOUNTER — Other Ambulatory Visit: Payer: Self-pay | Admitting: *Deleted

## 2016-05-16 ENCOUNTER — Other Ambulatory Visit: Payer: Self-pay | Admitting: *Deleted

## 2016-05-18 DIAGNOSIS — L570 Actinic keratosis: Secondary | ICD-10-CM | POA: Diagnosis not present

## 2016-05-18 DIAGNOSIS — L821 Other seborrheic keratosis: Secondary | ICD-10-CM | POA: Diagnosis not present

## 2016-05-18 DIAGNOSIS — L853 Xerosis cutis: Secondary | ICD-10-CM | POA: Diagnosis not present

## 2016-05-19 ENCOUNTER — Ambulatory Visit (HOSPITAL_COMMUNITY)
Admission: RE | Admit: 2016-05-19 | Discharge: 2016-05-19 | Disposition: A | Discharge status: Home / Self Care | Payer: Medicare Other | Source: Ambulatory Visit | Attending: Cardiology | Admitting: Cardiology

## 2016-05-19 DIAGNOSIS — I714 Abdominal aortic aneurysm, without rupture, unspecified: Secondary | ICD-10-CM

## 2016-05-19 DIAGNOSIS — I700 Atherosclerosis of aorta: Secondary | ICD-10-CM | POA: Diagnosis not present

## 2016-05-19 DIAGNOSIS — I708 Atherosclerosis of other arteries: Secondary | ICD-10-CM | POA: Insufficient documentation

## 2016-05-23 DIAGNOSIS — L821 Other seborrheic keratosis: Secondary | ICD-10-CM | POA: Diagnosis not present

## 2016-05-23 DIAGNOSIS — L718 Other rosacea: Secondary | ICD-10-CM | POA: Diagnosis not present

## 2016-05-23 DIAGNOSIS — Z85828 Personal history of other malignant neoplasm of skin: Secondary | ICD-10-CM | POA: Diagnosis not present

## 2016-05-23 DIAGNOSIS — D692 Other nonthrombocytopenic purpura: Secondary | ICD-10-CM | POA: Diagnosis not present

## 2016-05-23 DIAGNOSIS — L245 Irritant contact dermatitis due to other chemical products: Secondary | ICD-10-CM | POA: Diagnosis not present

## 2016-05-24 ENCOUNTER — Other Ambulatory Visit: Payer: Self-pay | Admitting: *Deleted

## 2016-05-24 DIAGNOSIS — I714 Abdominal aortic aneurysm, without rupture, unspecified: Secondary | ICD-10-CM

## 2016-06-01 ENCOUNTER — Encounter: Payer: Self-pay | Admitting: Nurse Practitioner

## 2016-06-01 ENCOUNTER — Non-Acute Institutional Stay: Payer: Medicare Other | Admitting: Nurse Practitioner

## 2016-06-01 DIAGNOSIS — E559 Vitamin D deficiency, unspecified: Secondary | ICD-10-CM

## 2016-06-01 DIAGNOSIS — N182 Chronic kidney disease, stage 2 (mild): Secondary | ICD-10-CM

## 2016-06-01 DIAGNOSIS — E039 Hypothyroidism, unspecified: Secondary | ICD-10-CM

## 2016-06-01 DIAGNOSIS — IMO0002 Reserved for concepts with insufficient information to code with codable children: Secondary | ICD-10-CM

## 2016-06-01 DIAGNOSIS — I714 Abdominal aortic aneurysm, without rupture, unspecified: Secondary | ICD-10-CM

## 2016-06-01 DIAGNOSIS — K5900 Constipation, unspecified: Secondary | ICD-10-CM | POA: Diagnosis not present

## 2016-06-01 DIAGNOSIS — M159 Polyosteoarthritis, unspecified: Secondary | ICD-10-CM

## 2016-06-01 DIAGNOSIS — E1121 Type 2 diabetes mellitus with diabetic nephropathy: Secondary | ICD-10-CM | POA: Diagnosis not present

## 2016-06-01 DIAGNOSIS — I5022 Chronic systolic (congestive) heart failure: Secondary | ICD-10-CM | POA: Diagnosis not present

## 2016-06-01 DIAGNOSIS — F329 Major depressive disorder, single episode, unspecified: Secondary | ICD-10-CM

## 2016-06-01 DIAGNOSIS — N3942 Incontinence without sensory awareness: Secondary | ICD-10-CM

## 2016-06-01 DIAGNOSIS — I639 Cerebral infarction, unspecified: Secondary | ICD-10-CM | POA: Diagnosis not present

## 2016-06-01 DIAGNOSIS — I10 Essential (primary) hypertension: Secondary | ICD-10-CM

## 2016-06-01 DIAGNOSIS — I480 Paroxysmal atrial fibrillation: Secondary | ICD-10-CM

## 2016-06-01 DIAGNOSIS — E1151 Type 2 diabetes mellitus with diabetic peripheral angiopathy without gangrene: Secondary | ICD-10-CM | POA: Diagnosis not present

## 2016-06-01 DIAGNOSIS — M150 Primary generalized (osteo)arthritis: Secondary | ICD-10-CM

## 2016-06-01 DIAGNOSIS — K219 Gastro-esophageal reflux disease without esophagitis: Secondary | ICD-10-CM | POA: Diagnosis not present

## 2016-06-01 DIAGNOSIS — R609 Edema, unspecified: Secondary | ICD-10-CM

## 2016-06-01 DIAGNOSIS — M1000 Idiopathic gout, unspecified site: Secondary | ICD-10-CM

## 2016-06-01 DIAGNOSIS — F0631 Mood disorder due to known physiological condition with depressive features: Secondary | ICD-10-CM

## 2016-06-01 DIAGNOSIS — E1122 Type 2 diabetes mellitus with diabetic chronic kidney disease: Secondary | ICD-10-CM

## 2016-06-01 DIAGNOSIS — I495 Sick sinus syndrome: Secondary | ICD-10-CM | POA: Diagnosis not present

## 2016-06-01 DIAGNOSIS — N401 Enlarged prostate with lower urinary tract symptoms: Secondary | ICD-10-CM

## 2016-06-01 DIAGNOSIS — Z7901 Long term (current) use of anticoagulants: Secondary | ICD-10-CM

## 2016-06-01 NOTE — Assessment & Plan Note (Signed)
urinary frequency at night, continue Tamsulosin 0.4mg 

## 2016-06-01 NOTE — Assessment & Plan Note (Signed)
Pace maker, ventricular paced rhythm 05/09/16

## 2016-06-01 NOTE — Assessment & Plan Note (Signed)
Continue Levothyroxine 164mcg, 05/05/16 TSH 2.05

## 2016-06-01 NOTE — Assessment & Plan Note (Signed)
No flare ups. continue Allopurinol 50mg  daily.

## 2016-06-01 NOTE — Assessment & Plan Note (Signed)
Diet controlled.  

## 2016-06-01 NOTE — Assessment & Plan Note (Addendum)
Cardiology 05/09/16 showed paced ventricular rhythm.  Rate controlled, continue Metoprolol 100mg  bid. Eliquis and ASA 81mg  for VTE risk reduction.

## 2016-06-01 NOTE — Progress Notes (Signed)
Location:  [place] Room Number: [place] of Service:  [phone]) Provider:  Sabri Teal, Manxie  NP  Jeanmarie Hubert, MD  Patient Care Team: [name], MD as PCP - General (Internal Medicine) [facility] Pixie Casino, MD as Consulting Physician (Cardiology) Ardis Hughs, MD as Attending Physician (Urology)  Extended Emergency Contact Information Primary Emergency Contact: [name] of [place] Phone: [phone] Relation: Daughter Secondary Emergency Contact: Eyvonne Mechanic [place] of [place] Phone: [phone] Mobile Phone: [phone] Relation: Daughter  Code Status:  Full Code Goals of care: Advanced Directive information Advanced Directives 06/01/2016  Does Patient Have a Medical Advance Directive? Yes  Type of Paramedic of [place];Living will;Out of facility DNR (pink MOST or yellow form)  Does patient want to make changes to medical advance directive? No - Patient declined  Copy of [place] in Chart? Yes  Pre-existing out of facility DNR order (yellow form or pink MOST form) Yellow form placed in chart (order not valid for inpatient use)     Chief Complaint  Patient presents with  . Medical Management of Chronic Issues    HPI:  Pt is a 83 y.o. male seen today for medical management of chronic diseases.    Hx of hypothyroidism, taking Levothyroxine 136mg, TSH 4.24 10/26/17CVA, CHF compensated clinically, HTN, controlled on Losartan '25mg'$ , anxiety, Ativan 0.'25mg'$  qd and q4h prn  is adequate, urinary frequency, managed with Flomax. CHF: less SOB, dry rales back of lungs, weight stable, on Furosemide '40mg'$ , Spironolactone '25mg'$  daily. BPH: no urinary retention, but dribbling, taking Tamsulosin 0.'4mg'$  daily. Gout: no flare ups, taking Allopurinol '50mg'$ . Afib/late effect of CVA: heart rate is in control, taking Metoprolol '100mg'$  bid, left facial  weakness, ambulates with walker, taking Eliquis and ASA '81mg'$  qd. Cardiology 05/09/16 showed paced ventricular rhythm.    Past Medical History:  Diagnosis Date  . Abnormal PFTs 10/30/2012   Followed in Pulmonary clinic/ [facility]/ [place]  - PFT's 10/22/12 VC  55% no obst, DLCO 50%  - PFT's 12/20/2012 VC 83% and no obst, dLCO 55%  -11/08/2012  Walked RA x 3 laps @ 185 ft each stopped due to  End of study, not desat -11/08/12 esr 10    . Abnormality of gait 04/18/2013  . Acute on chronic systolic congestive heart failure, NYHA class 2 ([facility] 03/16/2006   BNP 376.9 05/28/13   . Adjustment disorder with mixed anxiety and depressed mood 04/18/2013   Post CVA depression and anxiety   . Allergy   . Anxiety   . Aortic aneurysm of unspecified site without mention of rupture 10/27/2011  . Arthritis   . Atherosclerosis of renal artery ([facility] 10/18/2006  . Atrial fibrillation ([facility] 03/04/2011    He was on coumadin until his stroke last December and was switched to apixaban 2.'5mg'$  daily which he has been taking faithfully without reported side effects.    . Balance disorder 07/31/2014  . Basal cell carcinoma of skin of other and unspecified parts of face 12/24/2009  . Blood transfusion without reported diagnosis   . Cardiac pacemaker in situ- MDT 10/11 03/18/2010   Medtronic revo implant in October 2011. Severe sinus bradycardia in the 30s, but not truly pacemaker dependent   . Cataract   . CKD stage 2 due to type 2 diabetes mellitus ([facility] 07/03/2014  . Coronary artery disease   . CVA - Rt brain stroke 12/14 02/18/2013   02/19/13 angiography  CT head:  Diffuse atherosclerotic irregularity and plaque formation of the distal right common carotid artery and proximal internal carotid artery without significant stenosis. Plaque ulceration is present and is a source of emboli. A small right thalamic CVA    . DM (diabetes mellitus), type 2 with renal complications ([place]) 04/03/7865  . Dyslipidemia 11/03/2004  . Fall  03/04/2011  . Fatigue 04/18/2013  . Gout, unspecified 11/03/2004  . Heart murmur   . History of abdominal aortic aneurysm   . History of Doppler ultrasound 05/22/2012   LEAs; R anterior tibial artery appeared occluded; L posterior tibial shows short segment of occlusive ds  . History of Doppler ultrasound 05/22/2012   Abdominal Aortic Doppler; slight increase in fusiform aneurysm   . History of echocardiogram 12/2008   EF >55%; mild concentric LVH; mild MR; mild-mod TR; mild AV regurg;   . History of nuclear stress test 12/2010   lexiscan; low risk; compared to prior study, perfusion improved  . HTN (hypertension) 03/04/2011  . Hyperlipidemia   . Hypertension   . Hypertrophy of prostate with urinary obstruction and other lower urinary tract symptoms (LUTS) 04/28/2004  . Hyponatremia 03/04/2011  . Hypothyroidism 03/04/2011  . Impotence of organic origin 10/03/2000  . Internal hemorrhoids without mention of complication 67/20/9470  . Long term (current) use of anticoagulants 03/17/2011  . Major depressive disorder, single episode, unspecified 03/05/2009  . Memory change 01/24/2013  . Muscle weakness (generalized) 03/10/2011  . Nocturia 10/27/2011  . Obstructive sleep apnea (adult) (pediatric) 07/23/2008  . Open wound of knee, leg (except thigh), and ankle, complicated 9/62/8366  . Osteoarthritis of both knees 07/31/2014  . Osteoarthrosis, unspecified whether generalized or localized, unspecified site 02/05/2003  . PAF (paroxysmal atrial fibrillation) (HCC)    coumadin  . Pain in joint, lower leg 08/04/2011  . Pruritic condition 01/24/2013  . Quadriceps weakness 07/31/2014  . Right bundle branch block 04/19/2006  . 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 Spinal stenosis in cervical region 12/15/2004  . SSS (sick sinus syndrome) ([place]) 04/08/2014  . Stroke ([place])   . Thyroid disease   . TIA (transient ischemic attack) 04/30/2013  . Type II or  unspecified type diabetes mellitus without mention of complication, not stated as uncontrolled 11/24/2004  . Unspecified vitamin D deficiency 10/18/2006  . 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 Increased exposure of the right cornea.    Past Surgical History:  Procedure Laterality Date  . CORONARY ANGIOPLASTY WITH STENT PLACEMENT  2008   stent to SVG to OM  . CORONARY ARTERY BYPASS GRAFT  1989  . EYE SURGERY    . HERNIA REPAIR    . INSERT / REPLACE / REMOVE PACEMAKER    . JOINT REPLACEMENT    . PACEMAKER INSERTION  2011    Allergies  Allergen Reactions  . Quinolones Other (See Comments)    Risk of rupture of AAA  . Caduet [Amlodipine-Atorvastatin] Other (See Comments)    Weakness   . Crestor [Rosuvastatin] Other (See Comments)    Muscle pain/weakness  . Lipitor [Atorvastatin Calcium] Other (See Comments)    Muscle pain/weakness  . Simvastatin Other (See Comments)    Muscle pain/weakness  . Lisinopril Other (See Comments)    Doesn't remember reaction    Allergies as of 06/01/2016      Reactions   Quinolones Other (See Comments)   Risk of rupture of AAA   Caduet [  amlodipine-atorvastatin] Other (See Comments)   Weakness   Crestor [rosuvastatin] Other (See Comments)   Muscle pain/weakness   Lipitor [atorvastatin Calcium] Other (See Comments)   Muscle pain/weakness   Simvastatin Other (See Comments)   Muscle pain/weakness   Lisinopril Other (See Comments)   Doesn't remember reaction      Medication List       Accurate as of 06/01/16  5:23 PM. Always use your most recent med list.          acetaminophen 325 MG tablet Commonly known as:  TYLENOL Take 325 mg by mouth daily.   allopurinol 100 MG tablet Commonly known as:  ZYLOPRIM Take 50 mg by mouth daily.   amoxicillin 500 MG capsule Commonly known as:  AMOXIL Take 500 mg by mouth. Take 4 caps prior to dental procedures   apixaban 2.5 MG Tabs tablet Commonly known as:  ELIQUIS Take 1  tablet (2.5 mg total) by mouth 2 (two) times daily.   aspirin EC 81 MG tablet Take 81 mg by mouth daily.   BIOFREEZE 4 % Gel Generic drug:  Menthol (Topical Analgesic) Apply topically. Apply to left knee four times daily as needed for pain   carboxymethylcellulose 0.5 % Soln Commonly known as:  REFRESH PLUS Place 1 drop into both eyes every evening.   furosemide 40 MG tablet Commonly known as:  LASIX Take 40 mg by mouth daily. Take one tablet as needed if weight gain 3  Lbs in 24 hours or 5 lbs in a week   ipratropium 0.06 % nasal spray Commonly known as:  ATROVENT Place 2 sprays into both nostrils as directed. Uses 1-2x daily   levothyroxine 125 MCG tablet Commonly known as:  SYNTHROID, LEVOTHROID Take 125 mcg by mouth daily before breakfast.   LORazepam 0.5 MG tablet Commonly known as:  ATIVAN Take 0.25 mg by mouth. Take 1/2 tablet once a day; take 1/2 tablet every four hours as needed   losartan 25 MG tablet Commonly known as:  COZAAR Take 1 tablet (25 mg total) by mouth daily.   meclizine 25 MG tablet Commonly known as:  ANTIVERT Take 25 mg by mouth. Take one tablet every 6 hours as needed for dizziness   metoprolol 100 MG tablet Commonly known as:  LOPRESSOR Take 50 mg by mouth 2 (two) times daily.   nitroGLYCERIN 0.4 MG SL tablet Commonly known as:  NITROSTAT Place 0.4 mg under the tongue every 5 (five) minutes as needed for chest pain.   olopatadine 0.1 % ophthalmic solution Commonly known as:  PATANOL Place 1 drop into both eyes 2 (two) times daily as needed for allergies.   polyethylene glycol packet Commonly known as:  MIRALAX / GLYCOLAX Take 17 g by mouth daily.   RESOURCE BENEPROTEIN PO Take 15 mLs by mouth daily as needed.   spironolactone 25 MG tablet Commonly known as:  ALDACTONE Take 25 mg by mouth daily.   tamsulosin 0.4 MG Caps capsule Commonly known as:  FLOMAX Take one cap by mouth daily   tobramycin-dexamethasone ophthalmic  ointment Commonly known as:  TOBRADEX Place 1 application into both eyes 2 (two) times daily.   triamcinolone cream 0.1 % Commonly known as:  KENALOG Apply 1 application topically daily.   Vitamin D 400 units capsule Take 2,000 Units by mouth daily.       Review of Systems  Constitutional: Negative for fever and unexpected weight change.       Generalized weakness  HENT: Positive for  hearing loss and rhinorrhea. Negative for congestion, ear discharge, ear pain and tinnitus.        Left periorbital swelling and discomfort.  Eyes:       Chronic irritation of the right eye due to ectropion and failure of the lids to close during the day. Dry eyes  Respiratory: Positive for cough. Negative for shortness of breath and wheezing.   Cardiovascular: Positive for leg swelling (sometimes, not apparent today. ). Negative for palpitations.       Trace edema BLE, R>L  Gastrointestinal: Negative for abdominal pain and diarrhea.       Hx o dysphagia since his CVA. He thinks this has improved. Episodes of fecal incontinence. Hx of hemorrhoids.A tender Pilonidal cyst like growth at the right side of anus. Increased gas and fecal passage sometimes when his bladder moves.  Genitourinary: Positive for frequency and urgency.       Incontinent from time to time. Followed by Dr. Matilde Sprang.  Musculoskeletal: Positive for back pain. Negative for neck pain.  Skin: Negative for rash.       History of itching in the groin, which is chronic secondary to irritation from urinary incontinence, fecal incontinence, and adult diapering. Ulceration right medial malleolus since mid August 2017-healed.   Neurological: Positive for weakness.       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 Recurrent CVA on 04/30/13 of the left MCA area Right facial weakness  Psychiatric/Behavioral: The patient is not nervous/anxious.     Immunization History  Administered Date(s)  Administered  . Influenza Whole 12/06/2011, 12/19/2012  . Influenza-Unspecified 01/09/2014, 11/25/2014, 12/23/2015  . PPD Test 05/03/2013  . Pneumococcal Polysaccharide-23 03/07/1986  . Td 04/20/2005   Pertinent  Health Maintenance Due  Topic Date Due  . OPHTHALMOLOGY EXAM  01/12/1932  . PNA vac Low Risk Adult (1 of 2 - PCV13) 01/12/1989  . FOOT EXAM  07/31/2015  . HEMOGLOBIN A1C  06/30/2016  . INFLUENZA VACCINE  Completed   Fall Risk  10/15/2015 06/11/2015 10/23/2014 07/07/2014 02/17/2014  Falls in the past year? No Yes Yes Yes Exclusion - non ambulatory  Number falls in past yr: - '1 1 1 '$ -  Injury with Fall? - Yes - No -  Risk for fall due to : - - - History of fall(s);Impaired mobility;Impaired balance/gait -  Follow up - - - Falls evaluation completed;Falls prevention discussed -   Functional Status Survey:    Vitals:   06/01/16 1055  BP: 130/70  Pulse: 78  Resp: 20  Temp: (!) 96.6 F (35.9 C)  SpO2: 95%  Weight: 156 lb (70.8 kg)  Height: '5\' 8"'$  (1.727 m)   Body mass index is 23.72 kg/m. Physical Exam  Constitutional: He is oriented to person, place, and time. He appears well-developed and well-nourished. No distress.  HENT:  Severe hearing loss. Bilateral aides. Right facial droop.   Eyes:  Corrective lenses. Increased exposure of the right cornea due to post CVA facial droop. Irregular pupil on the left. Left periorbital swelling, increased clear drainage.  Neck: No JVD present. No tracheal deviation present. No thyromegaly present.  Cardiovascular: Normal rate.  Frequent extrasystoles are present.  Murmur heard. Pacemaker left upper chest Systolic murmur.   Pulmonary/Chest: Effort normal. He has no wheezes. He has rales.  dry rales posterior lung bases.   Abdominal: Soft. Bowel sounds are normal. He exhibits no distension and no mass. There is no tenderness.  Genitourinary:  Genitourinary Comments: .A tender Pilonidal  cyst like growth at the right side of anus   Musculoskeletal: Normal range of motion. He exhibits edema. He exhibits no tenderness.  Unstable gait. Using 4 wheel walker and w/c. Weaker on the right side. Hx of the right total knee replacement. Trace edema BLE, R>L. C/o left knee pain, f/u Ortho. Motorized w/c to go further  Lymphadenopathy:    He has no cervical adenopathy.  Neurological: He is alert and oriented to person, place, and time. He displays normal reflexes. No cranial nerve deficit. He exhibits normal muscle tone. Coordination normal.  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 Recurrent CVA on 04/30/13 of the left MCA area Right facial weakness. Facial asymmetry is less evident today.  Skin: Skin is warm and dry. No rash noted. No erythema. No pallor.  2 mm ulceration of the right medial malleolus-healed.   Psychiatric: He has a normal mood and affect. His behavior is normal. Judgment and thought content normal.  Nursing note and vitals reviewed.   Labs reviewed:  Recent Labs  12/31/15 01/26/16 05/05/16  NA 135* 135* 135*  K 4.6 4.6 4.8  BUN 28* 26* 28*  CREATININE 1.4* 1.2 1.1    Recent Labs  12/31/15 01/26/16 05/05/16  AST '20 25 18  '$ ALT '13 17 14  '$ ALKPHOS 80 74 98    Recent Labs  12/31/15 01/26/16 05/05/16  WBC 6.2 6.8 6.4  HGB 16.2 16.1 16.2  HCT 47 47 48  PLT 184 171 195   Lab Results  Component Value Date   TSH 4.24 12/31/2015   Lab Results  Component Value Date   HGBA1C 6.3 12/31/2015   Lab Results  Component Value Date   CHOL 117 05/01/2013   HDL 30 (L) 05/01/2013   LDLCALC 71 05/01/2013   TRIG 79 05/01/2013   CHOLHDL 3.9 05/01/2013    Significant Diagnostic Results in last 30 days:  No results found.  Assessment/Plan AAA (abdominal aortic aneurysm) ([place]) 05/19/16 Stable infrarenal fusiform AAA in the mid aorta measuring 4.0 X 3.6 cm.     Atrial fibrillation [facility]) Cardiology 05/09/16 showed paced ventricular rhythm.  Rate controlled,  continue Metoprolol '100mg'$  bid. Eliquis and ASA '81mg'$  for VTE risk reduction.  HTN (hypertension) Controlled, continue Metoprolol 100 mg bid, Losartan '50mg'$ , Furosemide '40mg'$  qd, Spironolactone '25mg'$ . 05/05/16 Na 135, K 4.8, Bun 28, creat 1.12, BNP282, TSH 2.05, wbc 6.4, Hgb 16.2, plt 195, Iron 117, Vit B12 1007, Vit D 37    Diabetes mellitus, type 2, with circulatory disorder (HCC) Controlled. Diet controlled.   Depression due to stroke ([place]) Stable, Continue Lorazepam 0.'125mg'$  qhs and prn V4U  Chronic systolic congestive heart failure ([place]) compensated clinically, continue Furosemide '40mg'$  qd, prn Furosemide. Spironolactone '25mg'$ , f/u Cardiology,  SSS (sick sinus syndrome) ([place]) Pace maker, ventricular paced rhythm 05/09/16  GERD (gastroesophageal reflux disease) Stable, off acid reducer  Constipation Stable, continue daily MiraLax  Hypothyroidism Continue Levothyroxine 186mg, 05/05/16 TSH 2.05  DM (diabetes mellitus), type 2 with renal complications ([facility] Diet controlled  CKD stage 2 due to type 2 diabetes mellitus ([facility] 05/05/16 creat 1.1  Osteoarthritis Ambulates with walker, w/c to go further. Tylenol '325mg'$  daily  Enlarged prostate with lower urinary tract symptoms (LUTS) urinary frequency at night, continue Tamsulosin 0.'4mg'$    Long term current use of anticoagulant therapy Elliquis and ASA for Afib, s/p CVA  Edema Continue Furosemide and Spironolactone. No apparent edema in BLE. Weights stable.   Major depressive disorder, single episode Stable, Continue Lorazepam  0.'125mg'$  qhs and prn q4h  Vitamin D deficiency Continue Vit D  Gout No flare ups. continue Allopurinol '50mg'$  daily.   Incontinent of urine urinary frequency at night, continue Tamsulosin 0.'4mg'$      Family/ staff Communication: AL  Labs/tests ordered:  none

## 2016-06-01 NOTE — Assessment & Plan Note (Addendum)
Controlled, continue Metoprolol 100 mg bid, Losartan 50mg , Furosemide 40mg  qd, Spironolactone 25mg . 05/05/16 Na 135, K 4.8, Bun 28, creat 1.12, BNP282, TSH 2.05, wbc 6.4, Hgb 16.2, plt 195, Iron 117, Vit B12 1007, Vit D 37

## 2016-06-01 NOTE — Assessment & Plan Note (Signed)
Ambulates with walker, w/c to go further. Tylenol 325mg  daily

## 2016-06-01 NOTE — Assessment & Plan Note (Signed)
Stable, Continue Lorazepam 0.125mg  qhs and prn q4h

## 2016-06-01 NOTE — Assessment & Plan Note (Signed)
Controlled. Diet controlled.

## 2016-06-01 NOTE — Assessment & Plan Note (Signed)
compensated clinically, continue Furosemide 40mg  qd, prn Furosemide. Spironolactone 25mg , f/u Cardiology,

## 2016-06-01 NOTE — Assessment & Plan Note (Signed)
Continue Furosemide and Spironolactone. No apparent edema in BLE. Weights stable.

## 2016-06-01 NOTE — Assessment & Plan Note (Signed)
05/05/16 creat 1.1

## 2016-06-01 NOTE — Assessment & Plan Note (Signed)
Stable, continue daily MiraLax.  

## 2016-06-01 NOTE — Assessment & Plan Note (Signed)
Continue Vit D 

## 2016-06-01 NOTE — Assessment & Plan Note (Signed)
05/19/16 Stable infrarenal fusiform AAA in the mid aorta measuring 4.0 X 3.6 cm.

## 2016-06-01 NOTE — Assessment & Plan Note (Signed)
Elliquis and ASA for Afib, s/p CVA

## 2016-06-01 NOTE — Assessment & Plan Note (Signed)
Stable, off acid reducer 

## 2016-06-02 ENCOUNTER — Encounter: Payer: Self-pay | Admitting: Internal Medicine

## 2016-06-02 ENCOUNTER — Non-Acute Institutional Stay: Payer: Medicare Other | Admitting: Internal Medicine

## 2016-06-02 VITALS — BP 120/68 | HR 62 | Temp 97.5°F | Ht 68.0 in | Wt 159.0 lb

## 2016-06-02 DIAGNOSIS — I10 Essential (primary) hypertension: Secondary | ICD-10-CM | POA: Diagnosis not present

## 2016-06-02 DIAGNOSIS — E1122 Type 2 diabetes mellitus with diabetic chronic kidney disease: Secondary | ICD-10-CM | POA: Diagnosis not present

## 2016-06-02 DIAGNOSIS — E1121 Type 2 diabetes mellitus with diabetic nephropathy: Secondary | ICD-10-CM

## 2016-06-02 DIAGNOSIS — E039 Hypothyroidism, unspecified: Secondary | ICD-10-CM

## 2016-06-02 DIAGNOSIS — R609 Edema, unspecified: Secondary | ICD-10-CM

## 2016-06-02 DIAGNOSIS — F0631 Mood disorder due to known physiological condition with depressive features: Secondary | ICD-10-CM | POA: Diagnosis not present

## 2016-06-02 DIAGNOSIS — I5022 Chronic systolic (congestive) heart failure: Secondary | ICD-10-CM | POA: Diagnosis not present

## 2016-06-02 DIAGNOSIS — I639 Cerebral infarction, unspecified: Secondary | ICD-10-CM | POA: Diagnosis not present

## 2016-06-02 DIAGNOSIS — N182 Chronic kidney disease, stage 2 (mild): Secondary | ICD-10-CM

## 2016-06-02 NOTE — Progress Notes (Signed)
Facility  [facility] Room Number: [phone]  Place of Service: [facility] (12) OFFICE    Allergies  Allergen Reactions  . Quinolones Other (See Comments)    Risk of rupture of AAA  . Caduet [Amlodipine-Atorvastatin] Other (See Comments)    Weakness   . Crestor [Rosuvastatin] Other (See Comments)    Muscle pain/weakness  . Lipitor [Atorvastatin Calcium] Other (See Comments)    Muscle pain/weakness  . Simvastatin Other (See Comments)    Muscle pain/weakness  . Lisinopril Other (See Comments)    Doesn't remember reaction    Chief Complaint  Patient presents with  . Medical Management of Chronic Issues    HPI:   Patient is feeling well and looks as good as I have seen him in the last 3 years.  Chronic systolic congestive heart failure (HCC) - compensated. Breathing easier  CKD stage 2 due to type 2 diabetes mellitus ([place]) - stable  Type 2 diabetes mellitus with diabetic nephropathy, without long-term current use of insulin (HCC) - controlled  Edema, unspecified type - improved  Essential hypertension - controlled  Hypothyroidism, unspecified type - compensated    Medications: Patient's Medications  New Prescriptions   No medications on file  Previous Medications   ACETAMINOPHEN (TYLENOL) 325 MG TABLET    Take 325 mg by mouth daily.    ALLOPURINOL (ZYLOPRIM) 100 MG TABLET    Take 50 mg by mouth daily.    AMOXICILLIN (AMOXIL) 500 MG CAPSULE    Take 500 mg by mouth. Take 4 caps prior to dental procedures   APIXABAN (ELIQUIS) 2.5 MG TABS TABLET    Take 1 tablet (2.5 mg total) by mouth 2 (two) times daily.   ASPIRIN EC 81 MG TABLET    Take 81 mg by mouth daily.   CARBOXYMETHYLCELLULOSE (REFRESH PLUS) 0.5 % SOLN    Place 1 drop into both eyes every evening.   CHOLECALCIFEROL (VITAMIN D) 400 UNITS CAPSULE    Take 2,000 Units by mouth daily.    FUROSEMIDE (LASIX) 40 MG TABLET    Take 40 mg by mouth daily. Take one tablet as needed if weight gain 3  Lbs in 24 hours or  5 lbs in a week   IPRATROPIUM (ATROVENT) 0.06 % NASAL SPRAY    Place 2 sprays into both nostrils as directed. Uses 1-2x daily   LEVOTHYROXINE (SYNTHROID, LEVOTHROID) 125 MCG TABLET    Take 125 mcg by mouth daily before breakfast.   LORAZEPAM (ATIVAN) 0.5 MG TABLET    Take 0.25 mg by mouth. Take 1/2 tablet once a day; take 1/2 tablet every four hours as needed   LOSARTAN (COZAAR) 25 MG TABLET    Take 1 tablet (25 mg total) by mouth daily.   MECLIZINE (ANTIVERT) 25 MG TABLET    Take 25 mg by mouth. Take one tablet every 6 hours as needed for dizziness   MENTHOL, TOPICAL ANALGESIC, (BIOFREEZE) 4 % GEL    Apply topically. Apply to left knee four times daily as needed for pain   METOPROLOL (LOPRESSOR) 100 MG TABLET    Take 50 mg by mouth 2 (two) times daily.    NITROGLYCERIN (NITROSTAT) 0.4 MG SL TABLET    Place 0.4 mg under the tongue every 5 (five) minutes as needed for chest pain.    OLOPATADINE (PATANOL) 0.1 % OPHTHALMIC SOLUTION    Place 1 drop into both eyes 2 (two) times daily as needed for allergies.   POLYETHYLENE GLYCOL (MIRALAX /  GLYCOLAX) PACKET    Take 17 g by mouth daily.   RESOURCE BENEPROTEIN PO    Take 15 mLs by mouth daily as needed.   SPIRONOLACTONE (ALDACTONE) 25 MG TABLET    Take 25 mg by mouth daily.   TAMSULOSIN (FLOMAX) 0.4 MG CAPS CAPSULE    Take one cap by mouth daily   TOBRAMYCIN-DEXAMETHASONE (TOBRADEX) OPHTHALMIC OINTMENT    Place 1 application into both eyes 2 (two) times daily.   TRIAMCINOLONE CREAM (KENALOG) 0.1 %    Apply 1 application topically daily.  Modified Medications   No medications on file  Discontinued Medications   No medications on file    Review of Systems  Constitutional: Negative for fever and unexpected weight change.       Generalized weakness  HENT: Positive for hearing loss and rhinorrhea. Negative for congestion, ear discharge, ear pain and tinnitus.        Left periorbital swelling and discomfort.  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 Dry eyes  Respiratory: Positive for cough. Negative for shortness of breath and wheezing.   Cardiovascular: Positive for leg swelling (sometimes, not apparent today. ). Negative for palpitations.       Trace edema BLE, R>L  Gastrointestinal: Negative for abdominal pain and diarrhea.       Hx o dysphagia since his CVA. He thinks this has improved. Episodes of fecal incontinence. Hx of hemorrhoids.A tender Pilonidal cyst like growth at the right side of anus. Increased gas and fecal passage sometimes when his bladder moves.  Genitourinary: Positive for frequency and urgency.       Incontinent from time to time. Followed by Dr. Sherron Monday.  Musculoskeletal: Positive for back pain. Negative for neck pain.  Skin: Negative for rash.       History of itching in the groin, which is chronic secondary to irritation from urinary incontinence, fecal incontinence, and adult diapering. Ulceration right medial malleolus since mid August 2017-healed.   Neurological: Positive for weakness.       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 Recurrent CVA on 04/30/13 of the left MCA area Right facial weakness  Psychiatric/Behavioral: The patient is not nervous/anxious.     Vitals:   06/02/16 1622  BP: 120/68  Pulse: 62  Temp: 97.5 F (36.4 C)  TempSrc: Oral  SpO2: 96%  Weight: 159 lb (72.1 kg)  Height: 5\' 8"  (1.727 m)   Body mass index is 24.18 kg/m. Wt Readings from Last 3 Encounters:  06/02/16 159 lb (72.1 kg)  06/01/16 156 lb (70.8 kg)  05/09/16 160 lb 12.8 oz (72.9 kg)      Physical Exam  Constitutional: He is oriented to person, place, and time. He appears well-developed and well-nourished. No distress.  HENT:  Severe hearing loss. Bilateral aides. Right facial droop.   Eyes:  Corrective lenses. Increased exposure of the right cornea due to post CVA facial droop.  Irregular pupil on the left. Left periorbital swelling, increased clear drainage.  Neck: No JVD present. No tracheal deviation present. No thyromegaly present.  Cardiovascular: Normal rate.  Frequent extrasystoles are present.  Murmur heard. Pacemaker left upper chest Systolic murmur.   Pulmonary/Chest: Effort normal. He has no wheezes. He has rales.  dry rales posterior lung bases.   Abdominal: Soft. Bowel sounds are normal. He exhibits no distension and no mass. There is  no tenderness.  Genitourinary:  Genitourinary Comments: .A tender Pilonidal cyst like growth at the right side of anus  Musculoskeletal: Normal range of motion. He exhibits edema. He exhibits no tenderness.  Unstable gait. Using 4 wheel walker and w/c. Weaker on the right side. Hx of the right total knee replacement. Trace edema BLE, R>L. C/o left knee pain, f/u Ortho. Motorized w/c to go further  Lymphadenopathy:    He has no cervical adenopathy.  Neurological: He is alert and oriented to person, place, and time. He displays normal reflexes. No cranial nerve deficit. He exhibits normal muscle tone. Coordination normal.  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 Recurrent CVA on 04/30/13 of the left MCA area Right facial weakness. Facial asymmetry is less evident today.  Skin: Skin is warm and dry. No rash noted. No erythema. No pallor.  2 mm ulceration of the right medial malleolus-healed.   Psychiatric: He has a normal mood and affect. His behavior is normal. Judgment and thought content normal.  Nursing note and vitals reviewed.   Labs reviewed: Lab Summary Latest Ref Rng & Units 05/05/2016 01/26/2016 12/31/2015  Hemoglobin 13.5 - 17.5 g/dL 16.2 16.1 16.2  Hematocrit 41 - 53 % 48 47 47  White count 10:3/mL 6.4 6.8 6.2  Platelet count 150 - 399 K/L 195 171 184  Sodium 137 - 147 mmol/L 135(A) 135(A) 135(A)  Potassium 3.4 - 5.3 mmol/L 4.8 4.6 4.6  Calcium - (None) (None)  (None)  Phosphorus - (None) (None) (None)  Creatinine 0.6 - 1.3 mg/dL 1.1 1.2 1.4(A)  AST 14 - 40 U/L '18 25 20  '$ Alk Phos 25 - 125 U/L 98 74 80  Bilirubin - (None) (None) (None)  Glucose mg/dL 105 112 28  Cholesterol - (None) (None) (None)  HDL cholesterol - (None) (None) (None)  Triglycerides - (None) (None) (None)  LDL Direct - (None) (None) (None)  LDL Calc - (None) (None) (None)  Total protein - (None) (None) (None)  Albumin - (None) (None) (None)  Some recent data might be hidden   Lab Results  Component Value Date   TSH 4.24 12/31/2015   TSH 3.19 02/19/2015   TSH 7.20 (A) 01/16/2015   Lab Results  Component Value Date   BUN 28 (A) 05/05/2016   BUN 26 (A) 01/26/2016   BUN 28 (A) 12/31/2015   Lab Results  Component Value Date   HGBA1C 6.3 12/31/2015   HGBA1C 6.9 07/02/2015   HGBA1C 6.5 (A) 08/29/2013    Assessment/Plan  1. Chronic systolic congestive heart failure (HCC) The current medical regimen is effective;  continue present plan and medications.  2. CKD stage 2 due to type 2 diabetes mellitus ([place]) stable  3. Type 2 diabetes mellitus with diabetic nephropathy, without long-term current use of insulin (HCC) controlled  4. Edema, unspecified type improved  5. Essential hypertension controlled  6. Hypothyroidism, unspecified type compensated

## 2016-06-14 NOTE — Addendum Note (Signed)
Addended by: [name] on: 06/14/2016 03:37 PM   Modules accepted: Level of Service

## 2016-06-15 ENCOUNTER — Non-Acute Institutional Stay: Payer: Medicare Other | Admitting: Nurse Practitioner

## 2016-06-15 ENCOUNTER — Encounter: Payer: Self-pay | Admitting: Nurse Practitioner

## 2016-06-15 DIAGNOSIS — I480 Paroxysmal atrial fibrillation: Secondary | ICD-10-CM | POA: Diagnosis not present

## 2016-06-15 DIAGNOSIS — M150 Primary generalized (osteo)arthritis: Secondary | ICD-10-CM

## 2016-06-15 DIAGNOSIS — IMO0002 Reserved for concepts with insufficient information to code with codable children: Secondary | ICD-10-CM

## 2016-06-15 DIAGNOSIS — E1122 Type 2 diabetes mellitus with diabetic chronic kidney disease: Secondary | ICD-10-CM | POA: Diagnosis not present

## 2016-06-15 DIAGNOSIS — F4323 Adjustment disorder with mixed anxiety and depressed mood: Secondary | ICD-10-CM

## 2016-06-15 DIAGNOSIS — E039 Hypothyroidism, unspecified: Secondary | ICD-10-CM | POA: Diagnosis not present

## 2016-06-15 DIAGNOSIS — R609 Edema, unspecified: Secondary | ICD-10-CM | POA: Diagnosis not present

## 2016-06-15 DIAGNOSIS — F329 Major depressive disorder, single episode, unspecified: Secondary | ICD-10-CM

## 2016-06-15 DIAGNOSIS — I5022 Chronic systolic (congestive) heart failure: Secondary | ICD-10-CM

## 2016-06-15 DIAGNOSIS — I10 Essential (primary) hypertension: Secondary | ICD-10-CM | POA: Diagnosis not present

## 2016-06-15 DIAGNOSIS — R202 Paresthesia of skin: Secondary | ICD-10-CM | POA: Insufficient documentation

## 2016-06-15 DIAGNOSIS — Z7901 Long term (current) use of anticoagulants: Secondary | ICD-10-CM

## 2016-06-15 DIAGNOSIS — N401 Enlarged prostate with lower urinary tract symptoms: Secondary | ICD-10-CM | POA: Diagnosis not present

## 2016-06-15 DIAGNOSIS — K219 Gastro-esophageal reflux disease without esophagitis: Secondary | ICD-10-CM | POA: Diagnosis not present

## 2016-06-15 DIAGNOSIS — R040 Epistaxis: Secondary | ICD-10-CM

## 2016-06-15 DIAGNOSIS — K5900 Constipation, unspecified: Secondary | ICD-10-CM

## 2016-06-15 DIAGNOSIS — M1000 Idiopathic gout, unspecified site: Secondary | ICD-10-CM

## 2016-06-15 DIAGNOSIS — M159 Polyosteoarthritis, unspecified: Secondary | ICD-10-CM

## 2016-06-15 DIAGNOSIS — E1151 Type 2 diabetes mellitus with diabetic peripheral angiopathy without gangrene: Secondary | ICD-10-CM

## 2016-06-15 DIAGNOSIS — N182 Chronic kidney disease, stage 2 (mild): Secondary | ICD-10-CM

## 2016-06-15 LAB — BASIC METABOLIC PANEL
BUN: 32 mg/dL — AB (ref 4–21)
CREATININE: 1.3 mg/dL (ref <=1.3)
GLUCOSE: 103 mg/dL
POTASSIUM: 4.7 mmol/L (ref 3.4–5.3)
SODIUM: 133 mmol/L — AB (ref 137–147)

## 2016-06-15 LAB — CBC AND DIFFERENTIAL
HEMATOCRIT: 48 % (ref 41–53)
HEMOGLOBIN: 16.2 g/dL (ref 13.5–17.5)
Platelets: 217 10*3/uL (ref 150–399)
WBC: 7.3 10*3/mL

## 2016-06-15 LAB — HEPATIC FUNCTION PANEL
ALT: 16 U/L (ref 10–40)
AST: 23 U/L (ref 14–40)
Alkaline Phosphatase: 82 U/L (ref 25–125)
Bilirubin, Total: 0.9 mg/dL

## 2016-06-15 NOTE — Assessment & Plan Note (Signed)
05/05/16 creat 1.1

## 2016-06-15 NOTE — Assessment & Plan Note (Addendum)
Tingling in shoulder blades, denied chest pain/pressure, palpitation, cough, he is afebrile, no O2 desaturation, may consider X-ray chest and thoracic spine. Observe.

## 2016-06-15 NOTE — Assessment & Plan Note (Signed)
Continue Levothyroxine 164mcg, 05/05/16 TSH 2.05

## 2016-06-15 NOTE — Assessment & Plan Note (Signed)
No flare ups. continue Allopurinol 50mg  daily.

## 2016-06-15 NOTE — Assessment & Plan Note (Signed)
Controlled. Diet controlled.

## 2016-06-15 NOTE — Assessment & Plan Note (Signed)
compensated clinically, continue Furosemide 40mg  qd, prn Furosemide. Spironolactone 25mg , f/u Cardiology,

## 2016-06-15 NOTE — Assessment & Plan Note (Signed)
Controlled, continue Metoprolol 100 mg bid, Losartan 50mg , Furosemide 40mg  qd, Spironolactone 25mg . 05/05/16 Na 135, K 4.8, Bun 28, creat 1.12, BNP282, TSH 2.05, wbc 6.4, Hgb 16.2, plt 195, Iron 117, Vit B12 1007, Vit D 37

## 2016-06-15 NOTE — Assessment & Plan Note (Signed)
Stable, continue daily MiraLax.  

## 2016-06-15 NOTE — Assessment & Plan Note (Signed)
Stable, off acid reducer 

## 2016-06-15 NOTE — Assessment & Plan Note (Signed)
Will apply ABT ointment bid, update CBC CMP UA C/S. Observe the patient.

## 2016-06-15 NOTE — Progress Notes (Signed)
Location:  [facility] Room Number: [place] of Service:  ALF [phone]) Provider:  Donnovan Stamour, Manxie  NP  Jeanmarie Hubert, MD  Patient Care Team: [name], MD as PCP - General (Internal Medicine) [facility] Pixie Casino, MD as Consulting Physician (Cardiology) Ardis Hughs, MD as Attending Physician (Urology)  Extended Emergency Contact Information Primary Emergency Contact: [name] of [place] Phone: [phone] Relation: Daughter Secondary Emergency Contact: Eyvonne Mechanic [place] of [place] Phone: [phone] Mobile Phone: [phone] Relation: Daughter  Code Status:  Full Code Goals of care: Advanced Directive information Advanced Directives 06/15/2016  Does Patient Have a Medical Advance Directive? Yes  Type of Paramedic of [place];Living will;Out of facility DNR (pink MOST or yellow form)  Does patient want to make changes to medical advance directive? No - Patient declined  Copy of [place] in Chart? Yes  Pre-existing out of facility DNR order (yellow form or pink MOST form) Yellow form placed in chart (order not valid for inpatient use)     Chief Complaint  Patient presents with  . Acute Visit    nosebleeds x 2, tingling in shoulder blades when he leans forward in the past several days.    HPI:  Pt is a 83 y.o. male seen today for an acute visit for nose bleed, able to stop with exerting pressure, taking Eliquis 2.71m bid. Also stated he has tingling sensation when leans forward, positional, denied chest pain/pressure or palpitation, he is afebrile, no O2 desaturation  Hx of hypothyroidism, taking Levothyroxine 1226m, TSH 4.24 10/26/17CVA, CHF compensated clinically, HTN, controlled on Losartan 2541manxiety, Ativan 0.17m38m and q4h prn is adequate, urinary frequency, managed with Flomax. CHF: less SOB, dry rales back of lungs, weight  stable, on Furosemide 40mg46mironolactone 17mg 54my. BPH: no urinary retention, but dribbling, taking Tamsulosin 0.4mg da50m. Gout: no flare ups, taking Allopurinol 50mg. A74mlate effect of CVA: heart rate is in control, taking Metoprolol 100mg bid46mft facial weakness, ambulates with walker, taking Eliquis and ASA 81mg qd. 43miology 05/09/16 showed paced ventricular rhythm.                  Past Medical History:  Diagnosis Date  . Abnormal PFTs 10/30/2012   Followed in Pulmonary clinic/ [facility]/ [place]  - PFT's 10/22/12 VC  55% no obst, DLCO 50%  - PFT's 12/20/2012 VC 83% and no obst, dLCO 55%  -11/08/2012  Walked RA x 3 laps @ 185 ft each stopped due to  End of study, not desat -11/08/12 esr 10    . Abnormality of gait 04/18/2013  . Acute on chronic systolic congestive heart failure, NYHA class 2 (HCC) 1/10/[place]8   BNP 376.9 05/28/13   . Adjustment disorder with mixed anxiety and depressed mood 04/18/2013   Post CVA depression and anxiety   . Allergy   . Anxiety   . Aortic aneurysm of unspecified site without mention of rupture 10/27/2011  . Arthritis   . Atherosclerosis of renal artery (HCC) 8/13/[place]8  . Atrial fibrillation (HCC) 12/28[place]12    He was on coumadin until his stroke last December and was switched to apixaban 2.5mg daily 81mch he has been taking faithfully without reported side effects.    . Balance disorder 07/31/2014  . Basal cell carcinoma of skin of other and unspecified parts of face 12/24/2009  . Blood transfusion without reported diagnosis   . Cardiac pacemaker in situ-  MDT 10/11 03/18/2010   Medtronic revo implant in October 2011. Severe sinus bradycardia in the 30s, but not truly pacemaker dependent   . Cataract   . CKD stage 2 due to type 2 diabetes mellitus ([place]) 07/03/2014  . Coronary artery disease   . CVA - Rt brain stroke 12/14 02/18/2013   02/19/13 angiography CT head:  Diffuse atherosclerotic irregularity and plaque formation of the distal right common  carotid artery and proximal internal carotid artery without significant stenosis. Plaque ulceration is present and is a source of emboli. A small right thalamic CVA    . DM (diabetes mellitus), type 2 with renal complications ([place]) 7/49/4496  . Dyslipidemia 11/03/2004  . Fall 03/04/2011  . Fatigue 04/18/2013  . Gout, unspecified 11/03/2004  . Heart murmur   . History of abdominal aortic aneurysm   . History of Doppler ultrasound 05/22/2012   LEAs; R anterior tibial artery appeared occluded; L posterior tibial shows short segment of occlusive ds  . History of Doppler ultrasound 05/22/2012   Abdominal Aortic Doppler; slight increase in fusiform aneurysm   . History of echocardiogram 12/2008   EF >55%; mild concentric LVH; mild MR; mild-mod TR; mild AV regurg;   . History of nuclear stress test 12/2010   lexiscan; low risk; compared to prior study, perfusion improved  . HTN (hypertension) 03/04/2011  . Hyperlipidemia   . Hypertension   . Hypertrophy of prostate with urinary obstruction and other lower urinary tract symptoms (LUTS) 04/28/2004  . Hyponatremia 03/04/2011  . Hypothyroidism 03/04/2011  . Impotence of organic origin 10/03/2000  . Internal hemorrhoids without mention of complication 75/91/6384  . Long term (current) use of anticoagulants 03/17/2011  . Major depressive disorder, single episode, unspecified 03/05/2009  . Memory change 01/24/2013  . Muscle weakness (generalized) 03/10/2011  . Nocturia 10/27/2011  . Obstructive sleep apnea (adult) (pediatric) 07/23/2008  . Open wound of knee, leg (except thigh), and ankle, complicated 6/65/9935  . Osteoarthritis of both knees 07/31/2014  . Osteoarthrosis, unspecified whether generalized or localized, unspecified site 02/05/2003  . PAF (paroxysmal atrial fibrillation) (HCC)    coumadin  . Pain in joint, lower leg 08/04/2011  . Pruritic condition 01/24/2013  . Quadriceps weakness 07/31/2014  . Right bundle branch block 04/19/2006  . Speech and  language deficit due to old cerebral infarction 04/18/2013   Slurred speech   . Spinal stenosis in cervical region 12/15/2004  . SSS (sick sinus syndrome) ([place]) 04/08/2014  . Stroke ([place])   . Thyroid disease   . TIA (transient ischemic attack) 04/30/2013  . Type II or unspecified type diabetes mellitus without mention of complication, not stated as uncontrolled 11/24/2004  . Unspecified vitamin D deficiency 10/18/2006  . 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 Increased exposure of the right cornea.    Past Surgical History:  Procedure Laterality Date  . CORONARY ANGIOPLASTY WITH STENT PLACEMENT  2008   stent to SVG to OM  . CORONARY ARTERY BYPASS GRAFT  1989  . EYE SURGERY    . HERNIA REPAIR    . INSERT / REPLACE / REMOVE PACEMAKER    . JOINT REPLACEMENT    . PACEMAKER INSERTION  2011    Allergies  Allergen Reactions  . Quinolones Other (See Comments)    Risk of rupture of AAA  . Caduet [Amlodipine-Atorvastatin] Other (See Comments)    Weakness   . Crestor [Rosuvastatin] Other (See Comments)    Muscle pain/weakness  . Lipitor [Atorvastatin Calcium] Other (  See Comments)    Muscle pain/weakness  . Simvastatin Other (See Comments)    Muscle pain/weakness  . Lisinopril Other (See Comments)    Doesn't remember reaction    Allergies as of 06/15/2016      Reactions   Quinolones Other (See Comments)   Risk of rupture of AAA   Caduet [amlodipine-atorvastatin] Other (See Comments)   Weakness   Crestor [rosuvastatin] Other (See Comments)   Muscle pain/weakness   Lipitor [atorvastatin Calcium] Other (See Comments)   Muscle pain/weakness   Simvastatin Other (See Comments)   Muscle pain/weakness   Lisinopril Other (See Comments)   Doesn't remember reaction      Medication List       Accurate as of 06/15/16  4:26 PM. Always use your most recent med list.          allopurinol 100 MG tablet Commonly known as:  ZYLOPRIM Take 50 mg by mouth daily.     amoxicillin 500 MG capsule Commonly known as:  AMOXIL Take 500 mg by mouth. Take 4 caps prior to dental procedures   apixaban 2.5 MG Tabs tablet Commonly known as:  ELIQUIS Take 1 tablet (2.5 mg total) by mouth 2 (two) times daily.   aspirin EC 81 MG tablet Take 81 mg by mouth daily.   BIOFREEZE 4 % Gel Generic drug:  Menthol (Topical Analgesic) Apply topically. Apply to left knee four times daily as needed for pain   carboxymethylcellulose 0.5 % Soln Commonly known as:  REFRESH PLUS Place 1 drop into both eyes every evening.   furosemide 40 MG tablet Commonly known as:  LASIX Take 40 mg by mouth daily. Take one tablet as needed if weight gain 3  Lbs in 24 hours or 5 lbs in a week   ipratropium 0.06 % nasal spray Commonly known as:  ATROVENT Place 2 sprays into both nostrils as directed. Uses 1-2x daily   levothyroxine 125 MCG tablet Commonly known as:  SYNTHROID, LEVOTHROID Take 125 mcg by mouth daily before breakfast.   LORazepam 0.5 MG tablet Commonly known as:  ATIVAN Take 0.25 mg by mouth. Take 1/2 tablet once a day; take 1/2 tablet every four hours as needed   losartan 25 MG tablet Commonly known as:  COZAAR Take 1 tablet (25 mg total) by mouth daily.   meclizine 25 MG tablet Commonly known as:  ANTIVERT Take 25 mg by mouth. Take one tablet every 6 hours as needed for dizziness   metoprolol 100 MG tablet Commonly known as:  LOPRESSOR Take 50 mg by mouth 2 (two) times daily.   nitroGLYCERIN 0.4 MG SL tablet Commonly known as:  NITROSTAT Place 0.4 mg under the tongue every 5 (five) minutes as needed for chest pain.   olopatadine 0.1 % ophthalmic solution Commonly known as:  PATANOL Place 1 drop into both eyes 2 (two) times daily as needed for allergies.   polyethylene glycol packet Commonly known as:  MIRALAX / GLYCOLAX Take 17 g by mouth daily.   RESOURCE BENEPROTEIN PO Take 15 mLs by mouth daily as needed.   spironolactone 25 MG tablet Commonly  known as:  ALDACTONE Take 25 mg by mouth daily.   tamsulosin 0.4 MG Caps capsule Commonly known as:  FLOMAX Take one cap by mouth daily   tobramycin-dexamethasone ophthalmic ointment Commonly known as:  TOBRADEX Place 1 application into both eyes 2 (two) times daily.   triamcinolone cream 0.1 % Commonly known as:  KENALOG Apply 1 application topically daily.  Vitamin D 400 units capsule Take 2,000 Units by mouth daily.       Review of Systems  Constitutional: Negative for fever and unexpected weight change.       Generalized weakness  HENT: Positive for hearing loss and rhinorrhea. Negative for congestion, ear discharge, ear pain and tinnitus.        Left periorbital swelling and discomfort.  Eyes:       Chronic irritation of the right eye due to ectropion and failure of the lids to close during the day. Dry eyes  Respiratory: Positive for cough. Negative for shortness of breath and wheezing.   Cardiovascular: Positive for leg swelling (sometimes, not apparent today. ). Negative for palpitations.       Trace edema BLE, R>L  Gastrointestinal: Negative for abdominal pain and diarrhea.       Hx o dysphagia since his CVA. He thinks this has improved. Episodes of fecal incontinence. Hx of hemorrhoids.A tender Pilonidal cyst like growth at the right side of anus. Increased gas and fecal passage sometimes when his bladder moves.  Genitourinary: Positive for frequency and urgency.       Incontinent from time to time. Followed by Dr. Matilde Sprang.  Musculoskeletal: Positive for back pain. Negative for neck pain.  Skin: Negative for rash.       History of itching in the groin, which is chronic secondary to irritation from urinary incontinence, fecal incontinence, and adult diapering. Ulceration right medial malleolus since mid August 2017-healed.   Neurological: Positive for weakness.       Thalamic CVA 02/18/2013. Residual right side weakness, slurred speech, and  depression. Recurrent CVA on 04/30/13 of the left MCA area Right facial weakness  Psychiatric/Behavioral: The patient is not nervous/anxious.     Immunization History  Administered Date(s) Administered  . Influenza Whole 12/06/2011, 12/19/2012  . Influenza-Unspecified 01/09/2014, 11/25/2014, 12/23/2015  . PPD Test 05/03/2013  . Pneumococcal Polysaccharide-23 03/07/1986  . Td 04/20/2005   Pertinent  Health Maintenance Due  Topic Date Due  . OPHTHALMOLOGY EXAM  01/12/1932  . PNA vac Low Risk Adult (1 of 2 - PCV13) 01/12/1989  . FOOT EXAM  07/31/2015  . HEMOGLOBIN A1C  06/30/2016  . INFLUENZA VACCINE  10/05/2016   Fall Risk  10/15/2015 06/11/2015 10/23/2014 07/07/2014 02/17/2014  Falls in the past year? No Yes Yes Yes Exclusion - non ambulatory  Number falls in past yr: - _0 -  Injury with Fall? - Yes - No -  Risk for fall due to : - - - History of fall(s);Impaired mobility;Impaired balance/gait -  Follow up - - - Falls evaluation completed;Falls prevention discussed -   Functional Status Survey:    Vitals:   06/15/16 1019  BP: 118/60  Pulse: 76  Resp: 20  Temp: 97.8 F (36.6 C)  SpO2: 96%  Weight: 157 lb (71.2 kg)  Height: _1  (1.727 m)   Body mass index is 23.87 kg/m. Physical Exam  Constitutional: He is oriented to person, place, and time. He appears well-developed and well-nourished. No distress.  HENT:  Severe hearing loss. Bilateral aides. Right facial droop.   Eyes:  Corrective lenses. Increased exposure of the right cornea due to post CVA facial droop. Irregular pupil on the left  Neck: No JVD present. No tracheal deviation present. No thyromegaly present.  Cardiovascular: Normal rate and normal heart sounds.  Frequent extrasystoles are present.  No murmur heard. Pacemaker left upper chest  Pulmonary/Chest: Effort normal. He has no wheezes.  He has rales.  dry rales posterior lung bases.   Abdominal: Soft. Bowel sounds are normal. He exhibits no distension  and no mass. There is no tenderness.  Genitourinary:  Genitourinary Comments: .A tender Pilonidal cyst like growth at the right side of anus  Musculoskeletal: Normal range of motion. He exhibits no edema or tenderness.  Unstable gait. Using 4 wheel walker and w/c. Weaker on the right side. Hx of the right total knee replacement. Trace edema BLE, R>L. C/o left knee pain, f/u Ortho. Motorized w/c to go further  Lymphadenopathy:    He has no cervical adenopathy.  Neurological: He is alert and oriented to person, place, and time. He displays normal reflexes. No cranial nerve deficit. He exhibits normal muscle tone. Coordination normal.  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 Recurrent CVA on 04/30/13 of the left MCA area Left facial weakness  Skin: Skin is warm and dry. No rash noted. No erythema. No pallor.  Psychiatric: He has a normal mood and affect. His behavior is normal. Judgment and thought content normal.  Nursing note and vitals reviewed.   Labs reviewed:  Recent Labs  12/31/15 01/26/16 05/05/16  NA 135* 135* 135*  K 4.6 4.6 4.8  BUN 28* 26* 28*  CREATININE 1.4* 1.2 1.1    Recent Labs  12/31/15 01/26/16 05/05/16  AST _0 ALT _1 ALKPHOS 80 74 98    Recent Labs  12/31/15 01/26/16 05/05/16  WBC 6.2 6.8 6.4  HGB 16.2 16.1 16.2  HCT 47 47 48  PLT 184 171 195   Lab Results  Component Value Date   TSH 4.24 12/31/2015   Lab Results  Component Value Date   HGBA1C 6.3 12/31/2015   Lab Results  Component Value Date   CHOL 117 05/01/2013   HDL 30 (L) 05/01/2013   LDLCALC 71 05/01/2013   TRIG 79 05/01/2013   CHOLHDL 3.9 05/01/2013    Significant Diagnostic Results in last 30 days:  No results found.  Assessment/Plan Epistaxis, recurrent Will apply ABT ointment bid, update CBC CMP UA C/S. Observe the patient.   Paresthesia Tingling in shoulder blades, denied chest pain/pressure, palpitation, cough, he  is afebrile, no O2 desaturation, may consider X-ray chest and thoracic spine. Observe.   Atrial fibrillation [facility]) Cardiology 05/09/16 showed paced ventricular rhythm.  Rate controlled, continue Metoprolol 18m bid. Eliquis and ASA 880mfor VTE risk reduction.  HTN (hypertension) Controlled, continue Metoprolol 100 mg bid, Losartan 5022mFurosemide 51m71m, Spironolactone 25mg63m1/18 Na 135, K 4.8, Bun 28, creat 1.12, BNP282, TSH 2.05, wbc 6.4, Hgb 16.2, plt 195, Iron 117, Vit B12 1007, Vit D 37  Diabetes mellitus, type 2, with circulatory disorder (HCC) Controlled. Diet controlled.   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 Spironolactone 25mg, f28mardiology,  GERD (gastroesophageal reflux disease) Stable, off acid reducer  Constipation Stable, continue daily MiraLax  Hypothyroidism Continue Levothyroxine 125mcg, 34m8 TSH 2.05   CKD stage 2 due to type 2 diabetes mellitus (HCC) 3/1/Elidacreat 1.1  Osteoarthritis Ambulates with walker, w/c to go further. Tylenol 325mg dail71mnlarged prostate with lower urinary tract symptoms (LUTS) urinary frequency at night, continue Tamsulosin 0.4mg  Long 35mm current use of anticoagulant therapy Elliquis and ASA for Afib, s/p CVA  Edema Continue Furosemide and Spironolactone. No apparent edema in BLE. Weights  stable.   Major depressive disorder, single episode Stable, Continue Lorazepam 0.172m prn q4h  Gout No flare ups. continue Allopurinol 541mdaily.    Adjustment disorder with mixed anxiety and depressed mood Stable, Continue Lorazepam 0.12534mrn q4h, off Lorazepam 0.125m103ms since 06/09/16     Family/ staff Communication: AL  Labs/tests ordered:  CBC CMP UA C/S

## 2016-06-15 NOTE — Assessment & Plan Note (Signed)
Elliquis and ASA for Afib, s/p CVA

## 2016-06-15 NOTE — Assessment & Plan Note (Signed)
Cardiology 05/09/16 showed paced ventricular rhythm.  Rate controlled, continue Metoprolol 100mg  bid. Eliquis and ASA 81mg  for VTE risk reduction.

## 2016-06-15 NOTE — Assessment & Plan Note (Signed)
Continue Furosemide and Spironolactone. No apparent edema in BLE. Weights stable.

## 2016-06-15 NOTE — Assessment & Plan Note (Signed)
Ambulates with walker, w/c to go further. Tylenol 325mg  daily

## 2016-06-15 NOTE — Assessment & Plan Note (Addendum)
Stable, Continue Lorazepam 0.125mg  prn q4h

## 2016-06-15 NOTE — Assessment & Plan Note (Signed)
Stable, Continue Lorazepam 0.125mg  prn q4h, off Lorazepam 0.125mg  qhs since 06/09/16

## 2016-06-15 NOTE — Assessment & Plan Note (Signed)
urinary frequency at night, continue Tamsulosin 0.4mg 

## 2016-06-16 DIAGNOSIS — I480 Paroxysmal atrial fibrillation: Secondary | ICD-10-CM | POA: Diagnosis not present

## 2016-06-16 DIAGNOSIS — I10 Essential (primary) hypertension: Secondary | ICD-10-CM | POA: Diagnosis not present

## 2016-06-16 DIAGNOSIS — I5022 Chronic systolic (congestive) heart failure: Secondary | ICD-10-CM | POA: Diagnosis not present

## 2016-06-16 DIAGNOSIS — R609 Edema, unspecified: Secondary | ICD-10-CM | POA: Diagnosis not present

## 2016-06-16 DIAGNOSIS — E039 Hypothyroidism, unspecified: Secondary | ICD-10-CM | POA: Diagnosis not present

## 2016-06-17 ENCOUNTER — Ambulatory Visit (INDEPENDENT_AMBULATORY_CARE_PROVIDER_SITE_OTHER): Payer: Medicare Other | Admitting: Internal Medicine

## 2016-06-17 ENCOUNTER — Encounter: Payer: Self-pay | Admitting: Internal Medicine

## 2016-06-17 VITALS — BP 90/58 | HR 64 | Ht 68.0 in | Wt 157.6 lb

## 2016-06-17 DIAGNOSIS — I480 Paroxysmal atrial fibrillation: Secondary | ICD-10-CM

## 2016-06-17 DIAGNOSIS — F0631 Mood disorder due to known physiological condition with depressive features: Secondary | ICD-10-CM

## 2016-06-17 DIAGNOSIS — I639 Cerebral infarction, unspecified: Secondary | ICD-10-CM | POA: Diagnosis not present

## 2016-06-17 DIAGNOSIS — Z950 Presence of cardiac pacemaker: Secondary | ICD-10-CM | POA: Diagnosis not present

## 2016-06-17 DIAGNOSIS — I495 Sick sinus syndrome: Secondary | ICD-10-CM | POA: Diagnosis not present

## 2016-06-17 NOTE — Progress Notes (Signed)
OFFICE NOTE  Chief Complaint:  Follow-up med changes  Primary Care Physician: Jeanmarie Hubert, MD  HPI:  Jon [name]  is a 83 yo male formerly followed by Dr. Rex Kras and recently seen by Cecilie Kicks, NP, for left lower extremity edema with discoloration of his toes, was beginning to be uncomfortable for him to walk. We did venous Dopplers. He was quite concerned about arterial blood supply. His mother ended up with bilateral amputations. Venous Dopplers showed a ruptured baker cyst, no DVT. He was instructed on this and since that time his symptoms have resolved completely. He is on Coumadin for paroxysmal A-fib which led to the discoloration in his toes. Additionally, he has a history of abdominal aortic aneurysm and because he was concerned about his arterial disease, if he had it, we did Dopplers.  He is here for the results of the Dopplers. Bilateral ABIs were 1.0, though he does have appearance of an occluded right anterior tibial artery and a left posterior tib demonstrated a short segment of occlusive disease with reconstitution at the ankle. His pulses have been 2+. He has no claudication symptoms. Additionally, the duplex of his abdominal aorta was slightly, minimally changed from his last one. Previously it was 3.95 x 3.57, now he is 3.7 x 4.0, essentially the same, very slight increase. He has no abdominal pain and no other complaints. He has really no complaints today, feels quite well. He is not aware of any tachycardias or palpitations. He has a Medtronic pacemaker which was interrogated today. This demonstrates a battery voltage of 3.0V.  His a-fib burden is 1.6% and he is on amiodarone.  Other history includes bypass grafting in 1989; vein graft was stented in 2008. Last stress test was 2012, low risk study. EF was 49%. Pacemaker was placed for paroxysmal A-fib and bradycardia, sick sinus syndrome in 2011. He is also on warfarin.   Jon [name] underwent PFTs on 10/22/2012. This  showed a moderately severe restrictive limitation as well as moderately reduced diffusion capacity. Based on these findings I recommended discontinuing his amiodarone and he also stopped his pravastatin. Since that time he's noted that he feels quite a bit better including some minor improvement in his knee pain. He still feels like his left knee is bothering him and he is status post right TKR. 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 I did refer him to the [facility], but has not been contacted for appointment so far.  Since his last followup, Jon [name] was seen by Dr. Sallyanne Kuster for a device check in his device appeared to be working properly. He was having atrial fibrillation, but a little burden. He continued on warfarin which was generally therapeutic. Unfortunately, in December he had a thalamic stroke which resulted in some right facial droop and hemiparesis. That is improving , but slowly. He was then changed to Eliquis for anticoagulation. He was also started on Zetia, as he has a history of statin intolerance in the past.  An echo performed in the hospital demonstrated an EF of 40-45%, which is mildly reduced from previous studies. In addition, he has had a small amount of weight gain and lower extremity swelling which is noted over the past several weeks while in recovery.  Unfortunately, he suffered another stroke last month around the time that we plan to admit him for tikosyn induction. He is still trying to recover from that.   Jon [name] appears euvolemic  today. He does have a small amount of lower extremity swelling. His weight has been stable. He is reporting some epistaxis. He is concerned about the dose of his Eliquis. He is also reporting some urinary incontinence which is new. There is some flank pain and there is some possible concern for urinary tract infection however he is awaiting the results of a urinalysis.  Jon [name] was seen in the office today as an  add-on for worsening swelling and shortness of breath. I reviewed notes from [facility] which indicate in January because of excessive urination he requested to come off of his diuretics. He was taken off the diuretics but quickly developed swelling and worsening shortness of breath. A BNP was obtained which was greater than 300. He was restarted on his diuretics and had some improvement in his shortness of breath but still has persistent symptoms. He's also developed asymmetric right lower extremity edema. He underwent an in office Doppler ultrasound that was negative for DVT. Those results are communicated in the office notes, but no formal radiology report is available. It would be unlikely for him to have DVT given the fact that he's on Eliquis. He still has persistent right lower extremity edema. He reports shortness of breath, fatigue and difficulty speaking. He denies any chest pain.  He is also overdue for pacemaker check and that was performed in the office today under my supervision.  Jon [name] returns today in the office. He reports an improvement in his shortness of breath with an increased dose of Lasix. An echocardiogram was performed which shows preserved systolic function of the left ventricle however there is significant right heart dysfunction and severe dilatation of the right ventricle. This is likely the cause of his shortness of breath. He seems to respond and diaphoretic. He reports his fatigue is improving and is currently working with physical therapy.  I saw Jon [name] back today in the office. When I last saw him he had had some leg swelling and I recommended increasing his Lasix to 40 mg twice daily. Unfortunately due to problems with incontinence he did not make those changes. Over the past 3 months there is been about a 13 pound weight gain. He has significant leg swelling. This is likely due to some right heart failure symptoms which were suspected at his last office  visit. He does get a little more short of breath with exertion as well.  Jon [name] returns again today for close follow-up. He is now been taking Lasix 40 mg twice daily. His weight is actually down 3-4 pounds since his last office visit. He's been urinating quite a bit more. He did see Dr. Louis Meckel for a urology evaluation. His medicines were adjusted and although he was having some urinary retention now is having problems with incontinence. He appears to have had some improvement in his swelling and overall some mild improvement in shortness of breath however the other night was noted to be short of breath and hypoxic. He was placed on oxygen overnight. Is not clear whether he will need nocturnal oxygen however he does have a remote history of sleep apnea and has not used CPAP in some time. Labs in early October indicated elevated BNP of 350, but this is not been reassessed since diuresis.  Jon [name] reports some improvement in his symptoms. He's more energetic today to fact has had recent treatment of his knees with injections and therapy at [facility]. He says this has helped significantly. He is  walking and does appear somewhat brighter today. Generally his energy level is pretty good although he naps fairly regularly. His weight is been stable. He denies any worsening shortness of breath or chest pain. He just did a remote check and will be due to see Dr. Loletha Grayer back in the office in a few months. He is overdue for abdominal ultrasound as he had a small aortic aneurysm which was seen in 2014. He's also had carotid Dopplers which showed mild carotid disease.  11/03/2015  Jon [name] returns today generally without any complaints. He looks like he's gained a few pounds but does not appear volume overloaded. He denies any chest pain or worsening shortness of breath. Fortunately he has had no falls. He has been struggling with somewhat lower blood pressure. He saw Dr. Sallyanne Kuster back in May and his blood pressure  medication was decreased. He still has some problems with hypotension. He seems to be asymptomatic with this however there is concern about his risk for falls. His for his blood pressure medication he is on losartan and metoprolol. 8 check of his pacemaker shows a persistent atrial fibrillation. It is normally functioning. He recently had normalization of his LVEF. He denies any bleeding problems on Eliquis which she is taking for recent thalamic stroke in the setting of A. fib. He had Dopplers this year of his abdominal aortic aneurysm which shows stable findings.  05/09/2016  Jon [name] returns today for follow-up. Overall he seems to be feeling well although he is sleeping quite a bit. He wondered if this could be due to vitamin D deficiency. His daughter thinks he should be on 2000 units of vitamin D a day versus 800 units. I don't see any problem with this although recently vitamin D level was appropriate at 50. He denies any chest pain any significant shortness of breath or worsening edema. Interestingly he recently has had remote pacemaker checks which show a frequency of pacing greater than 95%. He is on high-dose metoprolol 100 twice daily, therefore he may have some degree of medication related chronotropic incompetence.  06/17/2016  Jon [name] returns today for follow-up. Overall doing well. I decreased his metoprolol last time and he says he thinks he feels a little more energy. He also likes been on a higher dose of vitamin D. He says his skin is clear.Marland Kitchen He's also developed a little relationship with a lady at [facility] home which she seems to be quite guilty about. I'm pleased form. He has had no worsening edema and weight has been fairly stable.  PMHx:  Past Medical History:  Diagnosis Date  . Abnormal PFTs 10/30/2012   Followed in Pulmonary clinic/ [facility]/ [place]  - PFT's 10/22/12 VC  55% no obst, DLCO 50%  - PFT's 12/20/2012 VC 83% and no obst, dLCO 55%  -11/08/2012  Walked RA x 3 laps  @ 185 ft each stopped due to  End of study, not desat -11/08/12 esr 10    . Abnormality of gait 04/18/2013  . Acute on chronic systolic congestive heart failure, NYHA class 2 ([place]) 03/16/2006   BNP 376.9 05/28/13   . Adjustment disorder with mixed anxiety and depressed mood 04/18/2013   Post CVA depression and anxiety   . Allergy   . Anxiety   . Aortic aneurysm of unspecified site without mention of rupture 10/27/2011  . Arthritis   . Atherosclerosis of renal artery ([place]) 10/18/2006  . Atrial fibrillation ([place]) 03/04/2011    He was on coumadin until  his stroke last December and was switched to apixaban 2.22m daily which he has been taking faithfully without reported side effects.    . Balance disorder 07/31/2014  . Basal cell carcinoma of skin of other and unspecified parts of face 12/24/2009  . Blood transfusion without reported diagnosis   . Cardiac pacemaker in situ- MDT 10/11 03/18/2010   Medtronic revo implant in October 2011. Severe sinus bradycardia in the 30s, but not truly pacemaker dependent   . Cataract   . CKD stage 2 due to type 2 diabetes mellitus ([place] 07/03/2014  . Coronary artery disease   . CVA - Rt brain stroke 12/14 02/18/2013   02/19/13 angiography CT head:  Diffuse atherosclerotic irregularity and plaque formation of the distal right common carotid artery and proximal internal carotid artery without significant stenosis. Plaque ulceration is present and is a source of emboli. A small right thalamic CVA    . DM (diabetes mellitus), type 2 with renal complications ([place] 47/34/2876 . Dyslipidemia 11/03/2004  . Fall 03/04/2011  . Fatigue 04/18/2013  . Gout, unspecified 11/03/2004  . Heart murmur   . History of abdominal aortic aneurysm   . History of Doppler ultrasound 05/22/2012   LEAs; R anterior tibial artery appeared occluded; L posterior tibial shows short segment of occlusive ds  . History of Doppler ultrasound 05/22/2012   Abdominal Aortic Doppler; slight increase in  fusiform aneurysm   . History of echocardiogram 12/2008   EF >55%; mild concentric LVH; mild MR; mild-mod TR; mild AV regurg;   . History of nuclear stress test 12/2010   lexiscan; low risk; compared to prior study, perfusion improved  . HTN (hypertension) 03/04/2011  . Hyperlipidemia   . Hypertension   . Hypertrophy of prostate with urinary obstruction and other lower urinary tract symptoms (LUTS) 04/28/2004  . Hyponatremia 03/04/2011  . Hypothyroidism 03/04/2011  . Impotence of organic origin 10/03/2000  . Internal hemorrhoids without mention of complication 181/15/7262 . Long term (current) use of anticoagulants 03/17/2011  . Major depressive disorder, single episode, unspecified 03/05/2009  . Memory change 01/24/2013  . Muscle weakness (generalized) 03/10/2011  . Nocturia 10/27/2011  . Obstructive sleep apnea (adult) (pediatric) 07/23/2008  . Open wound of knee, leg (except thigh), and ankle, complicated 50/35/5974 . Osteoarthritis of both knees 07/31/2014  . Osteoarthrosis, unspecified whether generalized or localized, unspecified site 02/05/2003  . PAF (paroxysmal atrial fibrillation) (HCC)    coumadin  . Pain in joint, lower leg 08/04/2011  . Pruritic condition 01/24/2013  . Quadriceps weakness 07/31/2014  . Right bundle branch block 04/19/2006  . 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 Spinal stenosis in cervical region 12/15/2004  . SSS (sick sinus syndrome) ([place] 04/08/2014  . Stroke ([place]   . Thyroid disease   . TIA (transient ischemic attack) 04/30/2013  . Type II or unspecified type diabetes mellitus without mention of complication, not stated as uncontrolled 11/24/2004  . Unspecified vitamin D deficiency 10/18/2006  . 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 Increased exposure of the right cornea.     Past Surgical History:  Procedure Laterality Date  . CORONARY ANGIOPLASTY WITH STENT PLACEMENT  2008   stent  to SVG to OM  . CORONARY ARTERY BYPASS GRAFT  1989  . EYE SURGERY    . HERNIA REPAIR    . INSERT / REPLACE / REMOVE PACEMAKER    . JOINT REPLACEMENT    .  PACEMAKER INSERTION  2011    FAMHx:  Family History  Problem Relation Age of Onset  . Diabetes Father   . Other Mother     PAD with amputations    SOCHx:   reports that he quit smoking about 49 years ago. His smoking use included Cigarettes. He has a 11.25 pack-year smoking history. He has never used smokeless tobacco. He reports that he does not drink alcohol or use drugs.  ALLERGIES:  Allergies  Allergen Reactions  . Quinolones Other (See Comments)    Risk of rupture of AAA  . Caduet [Amlodipine-Atorvastatin] Other (See Comments)    Weakness   . Crestor [Rosuvastatin] Other (See Comments)    Muscle pain/weakness  . Lipitor [Atorvastatin Calcium] Other (See Comments)    Muscle pain/weakness  . Simvastatin Other (See Comments)    Muscle pain/weakness  . Lisinopril Other (See Comments)    Doesn't remember reaction    ROS: Pertinent items noted in HPI and remainder of comprehensive ROS otherwise negative.  HOME MEDS: Current Outpatient Prescriptions  Medication Sig Dispense Refill  . allopurinol (ZYLOPRIM) 100 MG tablet Take 50 mg by mouth daily.     Marland Kitchen amoxicillin (AMOXIL) 500 MG capsule Take 500 mg by mouth. Take 4 caps prior to dental procedures    . apixaban (ELIQUIS) 2.5 MG TABS tablet Take 1 tablet (2.5 mg total) by mouth 2 (two) times daily. 60 tablet 11  . aspirin EC 81 MG tablet Take 81 mg by mouth daily.    . carboxymethylcellulose (REFRESH PLUS) 0.5 % SOLN Place 1 drop into both eyes every evening.    . Cholecalciferol (VITAMIN D) 400 UNITS capsule Take 2,000 Units by mouth daily.     . furosemide (LASIX) 40 MG tablet Take 40 mg by mouth daily. Take one tablet as needed if weight gain 3  Lbs in 24 hours or 5 lbs in a week    . ipratropium (ATROVENT) 0.06 % nasal spray Place 2 sprays into both nostrils as  directed. Uses 1-2x daily    . levothyroxine (SYNTHROID, LEVOTHROID) 125 MCG tablet Take 125 mcg by mouth daily before breakfast.    . LORazepam (ATIVAN) 0.5 MG tablet Take 0.25 mg by mouth. Take 1/2 tablet once a day; take 1/2 tablet every four hours as needed    . losartan (COZAAR) 25 MG tablet Take 1 tablet (25 mg total) by mouth daily. 90 tablet 3  . meclizine (ANTIVERT) 25 MG tablet Take 25 mg by mouth. Take one tablet every 6 hours as needed for dizziness    . Menthol, Topical Analgesic, (BIOFREEZE) 4 % GEL Apply topically. Apply to left knee four times daily as needed for pain    . metoprolol (LOPRESSOR) 100 MG tablet Take 50 mg by mouth 2 (two) times daily.     . nitroGLYCERIN (NITROSTAT) 0.4 MG SL tablet Place 0.4 mg under the tongue every 5 (five) minutes as needed for chest pain.     Marland Kitchen olopatadine (PATANOL) 0.1 % ophthalmic solution Place 1 drop into both eyes 2 (two) times daily as needed for allergies.    . polyethylene glycol (MIRALAX / GLYCOLAX) packet Take 17 g by mouth daily.    . RESOURCE BENEPROTEIN PO Take 15 mLs by mouth daily as needed.    Marland Kitchen spironolactone (ALDACTONE) 25 MG tablet Take 25 mg by mouth daily.    . tamsulosin (FLOMAX) 0.4 MG CAPS capsule Take one cap by mouth daily    . tobramycin-dexamethasone (TOBRADEX)  ophthalmic ointment Place 1 application into both eyes 2 (two) times daily.    Marland Kitchen triamcinolone cream (KENALOG) 0.1 % Apply 1 application topically daily.     No current facility-administered medications for this visit.     LABS/IMAGING: No results found for this or any previous visit (from the past 48 hour(s)). No results found.  VITALS: There were no vitals taken for this visit.  EXAM: GEN: Awake, NAD HEENT: PERRLA, EOMI Neck: no JVP Cardiovascular: RRR Abdomen: Soft, nontender, mildly distended Extremities: trace sockline edema Skin: Warm, dry Neurologic: Grossly intact Psych: Pleasant  EKG: Deferred  ASSESSMENT: 1. Acute on chronic  systolic, right sided- congestive heart failure, NYHA Class II symptoms - EF 55-60% 2. Persistent atrial fibrillation 3. Sick sinus syndrome status post pacemaker placement with normal function 4. Abnormal PFT's - off of amiodarone 5. History of abdominal aortic aneurysm with mild enlargement 6. Coronary artery disease status post CABG in 1989 with stent to the vein graft in 2008 7. Minimal PAD with normal ABIs bilaterally 8. Recent thalamic stroke - now on Eliquis 9. Epistaxis 10. Urinary incontinence 11. Mild bilateral carotid artery disease  PLAN: 1.   Jon [name] seems to be doing a little better on a lower dose metoprolol. Blood pressure today was low in the 90s and he reported being a little dizzy. At times though his blood pressure is generally normal 1 teens. I will not adjust the medicines today rather I've advised him to mention this to the staff and have his blood pressure continue to be monitored. If he is persistently below 767 systolic I would discontinue losartan as per his primary care providers.  Follow-up in 6 months.  Pixie Casino, MD, [facility] Attending Cardiologist [place] 06/17/2016, 1:31 PM

## 2016-06-17 NOTE — Patient Instructions (Signed)
No changes to medications today.  Your physician recommends that you schedule a follow-up appointment in: 6 months with Dr. Debara Pickett

## 2016-06-29 ENCOUNTER — Other Ambulatory Visit: Payer: Self-pay | Admitting: *Deleted

## 2016-07-13 ENCOUNTER — Non-Acute Institutional Stay: Payer: Medicare Other | Admitting: Nurse Practitioner

## 2016-07-13 ENCOUNTER — Encounter: Payer: Self-pay | Admitting: Nurse Practitioner

## 2016-07-13 DIAGNOSIS — K219 Gastro-esophageal reflux disease without esophagitis: Secondary | ICD-10-CM | POA: Diagnosis not present

## 2016-07-13 DIAGNOSIS — R609 Edema, unspecified: Secondary | ICD-10-CM

## 2016-07-13 DIAGNOSIS — R252 Cramp and spasm: Secondary | ICD-10-CM

## 2016-07-13 DIAGNOSIS — L299 Pruritus, unspecified: Secondary | ICD-10-CM | POA: Diagnosis not present

## 2016-07-13 DIAGNOSIS — M150 Primary generalized (osteo)arthritis: Secondary | ICD-10-CM | POA: Diagnosis not present

## 2016-07-13 DIAGNOSIS — I10 Essential (primary) hypertension: Secondary | ICD-10-CM | POA: Diagnosis not present

## 2016-07-13 DIAGNOSIS — I5022 Chronic systolic (congestive) heart failure: Secondary | ICD-10-CM

## 2016-07-13 DIAGNOSIS — E1122 Type 2 diabetes mellitus with diabetic chronic kidney disease: Secondary | ICD-10-CM | POA: Diagnosis not present

## 2016-07-13 DIAGNOSIS — E1151 Type 2 diabetes mellitus with diabetic peripheral angiopathy without gangrene: Secondary | ICD-10-CM

## 2016-07-13 DIAGNOSIS — K5900 Constipation, unspecified: Secondary | ICD-10-CM

## 2016-07-13 DIAGNOSIS — R413 Other amnesia: Secondary | ICD-10-CM

## 2016-07-13 DIAGNOSIS — I480 Paroxysmal atrial fibrillation: Secondary | ICD-10-CM

## 2016-07-13 DIAGNOSIS — N182 Chronic kidney disease, stage 2 (mild): Secondary | ICD-10-CM

## 2016-07-13 DIAGNOSIS — M159 Polyosteoarthritis, unspecified: Secondary | ICD-10-CM

## 2016-07-13 DIAGNOSIS — E039 Hypothyroidism, unspecified: Secondary | ICD-10-CM

## 2016-07-13 DIAGNOSIS — H02122 Mechanical ectropion of right lower eyelid: Secondary | ICD-10-CM

## 2016-07-13 DIAGNOSIS — J309 Allergic rhinitis, unspecified: Secondary | ICD-10-CM | POA: Diagnosis not present

## 2016-07-13 DIAGNOSIS — F329 Major depressive disorder, single episode, unspecified: Secondary | ICD-10-CM

## 2016-07-13 DIAGNOSIS — I6340 Cerebral infarction due to embolism of unspecified cerebral artery: Secondary | ICD-10-CM | POA: Diagnosis not present

## 2016-07-13 DIAGNOSIS — R040 Epistaxis: Secondary | ICD-10-CM

## 2016-07-13 DIAGNOSIS — N401 Enlarged prostate with lower urinary tract symptoms: Secondary | ICD-10-CM

## 2016-07-13 DIAGNOSIS — Z7901 Long term (current) use of anticoagulants: Secondary | ICD-10-CM

## 2016-07-13 DIAGNOSIS — M1000 Idiopathic gout, unspecified site: Secondary | ICD-10-CM

## 2016-07-13 NOTE — Assessment & Plan Note (Signed)
Improved, seldom used prn Ativan.

## 2016-07-13 NOTE — Assessment & Plan Note (Signed)
No flare ups. continue Allopurinol 50mg  daily.

## 2016-07-13 NOTE — Assessment & Plan Note (Signed)
Artificial tears, Patanol.

## 2016-07-13 NOTE — Assessment & Plan Note (Signed)
Injured left septum seen, continue moisturizer

## 2016-07-13 NOTE — Assessment & Plan Note (Addendum)
compensated clinically, continue Furosemide 40mg  qd, prn Furosemide. Spironolactone 25mg , f/u Cardiology, 06/16/16 Na 133, K 4.7, Bun 32, creat 1.29, wbc 7.3, Hgb 16.2, plt 217

## 2016-07-13 NOTE — Assessment & Plan Note (Signed)
Dry skin, moisturizer prn

## 2016-07-13 NOTE — Assessment & Plan Note (Signed)
Stable, continue daily MiraLax.  

## 2016-07-13 NOTE — Assessment & Plan Note (Signed)
Adequate functioning AL

## 2016-07-13 NOTE — Assessment & Plan Note (Signed)
Continue Levothyroxine 188mcg, 05/05/16 TSH 2.05

## 2016-07-13 NOTE — Assessment & Plan Note (Addendum)
06/16/16 Na 133, K 4.7, Bun 32, creat 1.29, wbc 7.3, Hgb 16.2, plt 217 Continue Furosemide 40mg , Spironolactone 25mg  qd

## 2016-07-13 NOTE — Assessment & Plan Note (Signed)
Cardiology 05/09/16 showed paced ventricular rhythm.  Rate controlled, continue Metoprolol 100mg  bid. Eliquis and ASA 81mg  for VTE risk reduction.

## 2016-07-13 NOTE — Assessment & Plan Note (Signed)
Elliquis and ASA for Afib, s/p CVA

## 2016-07-13 NOTE — Assessment & Plan Note (Signed)
Prn Ativan, seldom used. Sleeps w/o usual hs dose of Ativan.

## 2016-07-13 NOTE — Assessment & Plan Note (Signed)
Continue Furosemide and Spironolactone. No apparent edema in BLE. Weights stable.

## 2016-07-13 NOTE — Assessment & Plan Note (Signed)
Running nose, Atrovent nasal spray

## 2016-07-13 NOTE — Progress Notes (Signed)
Location:  [facility] Room Number: [place] of Service:  ALF [phone] Provider:  Cashlyn Huguley, Manxie  NP  [name], MD  Patient Care Team: [name], MD as PCP - General (Internal Medicine) [place], [facility], Nadean Corwin, MD as Consulting Physician (Cardiology) Ardis Hughs, MD as Attending Physician (Urology)  Extended Emergency Contact Information Primary Emergency Contact: [name] of [place] Phone: [phone] Relation: Daughter Secondary Emergency Contact: Eyvonne Mechanic [place] of [place] Phone: [phone] Mobile Phone: [phone] Relation: Daughter  Code Status:  Full Code Goals of care: Advanced Directive information Advanced Directives 07/13/2016  Does Patient Have a Medical Advance Directive? Yes  Type of Paramedic of [place];Living will;Out of facility DNR (pink MOST or yellow form)  Does patient want to make changes to medical advance directive? No - Patient declined  Copy of Pass [place] in Chart? Yes  Pre-existing out of facility DNR order (yellow form or pink MOST form) Yellow form placed in chart (order not valid for inpatient use)     Chief Complaint  Patient presents with  . Medical Management of Chronic Issues    HPI:  Pt is a 83 y.o. male seen today for medical management of chronic diseases.     Hx of hypothyroidism, taking Levothyroxine 151mg, TSH 2.05 05/05/16. CVA, CHF compensated clinically, HTN, controlled on Losartan '25mg'$ , anxiety, Ativan 0.'25mg'$  qd and q4h prn is adequate, urinary frequency, managed with Flomax. CHF: less SOB, dry rales back of lungs, weight stable, on Furosemide '40mg'$ , Spironolactone '25mg'$  daily. BPH: no urinary retention, but dribbling, taking Tamsulosin 0.'4mg'$  daily. Gout: no flare ups, taking Allopurinol '50mg'$ . Afib/late effect of CVA: heart rate is in control, taking Metoprolol '100mg'$  bid, left facial  weakness, ambulates with walker, taking Eliquis and ASA '81mg'$  qd. Cardiology 05/09/16 showed paced ventricular rhythm.    Past Medical History:  Diagnosis Date  . Abnormal PFTs 10/30/2012   Followed in Pulmonary clinic/ [facility]/ [place]  - PFT's 10/22/12 VC  55% no obst, DLCO 50%  - PFT's 12/20/2012 VC 83% and no obst, dLCO 55%  -11/08/2012  Walked RA x 3 laps @ 185 ft each stopped due to  End of study, not desat -11/08/12 esr 10    . Abnormality of gait 04/18/2013  . Acute on chronic systolic congestive heart failure, NYHA class 2 ([facility] 03/16/2006   BNP 376.9 05/28/13   . Adjustment disorder with mixed anxiety and depressed mood 04/18/2013   Post CVA depression and anxiety   . Allergy   . Anxiety   . Aortic aneurysm of unspecified site without mention of rupture 10/27/2011  . Arthritis   . Atherosclerosis of renal artery ([facility] 10/18/2006  . Atrial fibrillation ([facility] 03/04/2011    He was on coumadin until his stroke last December and was switched to apixaban 2.'5mg'$  daily which he has been taking faithfully without reported side effects.    . Balance disorder 07/31/2014  . Basal cell carcinoma of skin of other and unspecified parts of face 12/24/2009  . Blood transfusion without reported diagnosis   . Cardiac pacemaker in situ- MDT 10/11 03/18/2010   Medtronic revo implant in October 2011. Severe sinus bradycardia in the 30s, but not truly pacemaker dependent   . Cataract   . CKD stage 2 due to type 2 diabetes mellitus ([facility] 07/03/2014  . Coronary artery disease   . CVA - Rt brain stroke 12/14 02/18/2013  02/19/13 angiography CT head:  Diffuse atherosclerotic irregularity and plaque formation of the distal right common carotid artery and proximal internal carotid artery without significant stenosis. Plaque ulceration is present and is a source of emboli. A small right thalamic CVA    . DM (diabetes mellitus), type 2 with renal complications ([place]) 0/27/7412  . Dyslipidemia 11/03/2004  . Fall  03/04/2011  . Fatigue 04/18/2013  . Gout, unspecified 11/03/2004  . Heart murmur   . History of abdominal aortic aneurysm   . History of Doppler ultrasound 05/22/2012   LEAs; R anterior tibial artery appeared occluded; L posterior tibial shows short segment of occlusive ds  . History of Doppler ultrasound 05/22/2012   Abdominal Aortic Doppler; slight increase in fusiform aneurysm   . History of echocardiogram 12/2008   EF >55%; mild concentric LVH; mild MR; mild-mod TR; mild AV regurg;   . History of nuclear stress test 12/2010   lexiscan; low risk; compared to prior study, perfusion improved  . HTN (hypertension) 03/04/2011  . Hyperlipidemia   . Hypertension   . Hypertrophy of prostate with urinary obstruction and other lower urinary tract symptoms (LUTS) 04/28/2004  . Hyponatremia 03/04/2011  . Hypothyroidism 03/04/2011  . Impotence of organic origin 10/03/2000  . Internal hemorrhoids without mention of complication 87/86/7672  . Long term (current) use of anticoagulants 03/17/2011  . Major depressive disorder, single episode, unspecified 03/05/2009  . Memory change 01/24/2013  . Muscle weakness (generalized) 03/10/2011  . Nocturia 10/27/2011  . Obstructive sleep apnea (adult) (pediatric) 07/23/2008  . Open wound of knee, leg (except thigh), and ankle, complicated 0/94/7096  . Osteoarthritis of both knees 07/31/2014  . Osteoarthrosis, unspecified whether generalized or localized, unspecified site 02/05/2003  . PAF (paroxysmal atrial fibrillation) (HCC)    coumadin  . Pain in joint, lower leg 08/04/2011  . Pruritic condition 01/24/2013  . Quadriceps weakness 07/31/2014  . Right bundle branch block 04/19/2006  . 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 Spinal stenosis in cervical region 12/15/2004  . SSS (sick sinus syndrome) ([place]) 04/08/2014  . Stroke ([place])   . Thyroid disease   . TIA (transient ischemic attack) 04/30/2013  . Type II or  unspecified type diabetes mellitus without mention of complication, not stated as uncontrolled 11/24/2004  . Unspecified vitamin D deficiency 10/18/2006  . 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 Increased exposure of the right cornea.    Past Surgical History:  Procedure Laterality Date  . CORONARY ANGIOPLASTY WITH STENT PLACEMENT  2008   stent to SVG to OM  . CORONARY ARTERY BYPASS GRAFT  1989  . EYE SURGERY    . HERNIA REPAIR    . INSERT / REPLACE / REMOVE PACEMAKER    . JOINT REPLACEMENT    . PACEMAKER INSERTION  2011    Allergies  Allergen Reactions  . Quinolones Other (See Comments)    Risk of rupture of AAA  . Caduet [Amlodipine-Atorvastatin] Other (See Comments)    Weakness   . Crestor [Rosuvastatin] Other (See Comments)    Muscle pain/weakness  . Lipitor [Atorvastatin Calcium] Other (See Comments)    Muscle pain/weakness  . Simvastatin Other (See Comments)    Muscle pain/weakness  . Lisinopril Other (See Comments)    Doesn't remember reaction    Outpatient Encounter Prescriptions as of 07/13/2016  Medication Sig  . allopurinol (ZYLOPRIM) 100 MG tablet Take 50 mg by mouth daily.   Marland Kitchen  amoxicillin (AMOXIL) 500 MG capsule Take 500 mg by mouth. Take 4 caps prior to dental procedures  . apixaban (ELIQUIS) 2.5 MG TABS tablet Take 1 tablet (2.5 mg total) by mouth 2 (two) times daily.  Marland Kitchen aspirin EC 81 MG tablet Take 81 mg by mouth daily.  . carboxymethylcellulose (REFRESH PLUS) 0.5 % SOLN Place 1 drop into both eyes every evening.  . Cholecalciferol (VITAMIN D) 400 UNITS capsule Take 2,000 Units by mouth daily.   . furosemide (LASIX) 40 MG tablet Take 40 mg by mouth daily. Take one tablet as needed if weight gain 3  Lbs in 24 hours or 5 lbs in a week  . ipratropium (ATROVENT) 0.06 % nasal spray Place 2 sprays into both nostrils as directed. Uses 1-2x daily  . levothyroxine (SYNTHROID, LEVOTHROID) 125 MCG tablet Take 125 mcg by mouth daily before breakfast.    . LORazepam (ATIVAN) 0.5 MG tablet Take 0.25 mg by mouth. Take 1/2 tablet once a day; take 1/2 tablet every four hours as needed  . losartan (COZAAR) 25 MG tablet Take 1 tablet (25 mg total) by mouth daily.  . meclizine (ANTIVERT) 25 MG tablet Take 25 mg by mouth. Take one tablet every 6 hours as needed for dizziness  . Menthol, Topical Analgesic, (BIOFREEZE) 4 % GEL Apply topically. Apply to left knee four times daily as needed for pain  . metoprolol (LOPRESSOR) 100 MG tablet Take 50 mg by mouth 2 (two) times daily.   . nitroGLYCERIN (NITROSTAT) 0.4 MG SL tablet Place 0.4 mg under the tongue every 5 (five) minutes as needed for chest pain.   Marland Kitchen olopatadine (PATANOL) 0.1 % ophthalmic solution Place 1 drop into both eyes 2 (two) times daily as needed for allergies.  . polyethylene glycol (MIRALAX / GLYCOLAX) packet Take 17 g by mouth daily.  . RESOURCE BENEPROTEIN PO Take 15 mLs by mouth daily as needed.  Marland Kitchen spironolactone (ALDACTONE) 25 MG tablet Take 25 mg by mouth daily.  . tamsulosin (FLOMAX) 0.4 MG CAPS capsule Take one cap by mouth daily  . tobramycin-dexamethasone (TOBRADEX) ophthalmic ointment Place 1 application into both eyes 2 (two) times daily.  Marland Kitchen triamcinolone cream (KENALOG) 0.1 % Apply 1 application topically daily.   No facility-administered encounter medications on file as of 07/13/2016.     Review of Systems  Constitutional: Negative for fever and unexpected weight change.       Generalized weakness  HENT: Positive for hearing loss and rhinorrhea. Negative for congestion, ear discharge, ear pain and tinnitus.        Left periorbital swelling and discomfort.  Eyes:       Chronic irritation of the right eye due to ectropion and failure of the lids to close during the day. Dry eyes  Respiratory: Positive for cough. Negative for shortness of breath and wheezing.   Cardiovascular: Negative for palpitations and leg swelling (sometimes, not apparent today. ).       Trace edema BLE,  R>L  Gastrointestinal: Negative for abdominal pain and diarrhea.       Hx o dysphagia since his CVA. He thinks this has improved. Episodes of fecal incontinence. Hx of hemorrhoids.A tender Pilonidal cyst like growth at the right side of anus. Increased gas and fecal passage sometimes when his bladder moves.  Genitourinary: Positive for frequency. Negative for urgency.       Incontinent from time to time. Followed by Dr. Sherron Monday.  Musculoskeletal: Positive for back pain. Negative for neck pain.  Skin:  Negative for rash.       History of itching in the groin, which is chronic secondary to irritation from urinary incontinence, fecal incontinence, and adult diapering. Ulceration right medial malleolus since mid August 2017-healed.   Neurological: Positive for weakness.       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 Recurrent CVA on 04/30/13 of the left MCA area Right facial weakness  Psychiatric/Behavioral: The patient is not nervous/anxious.     Immunization History  Administered Date(s) Administered  . Influenza Whole 12/06/2011, 12/19/2012  . Influenza-Unspecified 01/09/2014, 11/25/2014, 12/23/2015  . PPD Test 05/03/2013  . Pneumococcal Polysaccharide-23 03/07/1986  . Td 04/20/2005   Pertinent  Health Maintenance Due  Topic Date Due  . OPHTHALMOLOGY EXAM  01/12/1932  . PNA vac Low Risk Adult (1 of 2 - PCV13) 01/12/1989  . FOOT EXAM  07/31/2015  . HEMOGLOBIN A1C  06/30/2016  . INFLUENZA VACCINE  10/05/2016   Fall Risk  10/15/2015 06/11/2015 10/23/2014 07/07/2014 02/17/2014  Falls in the past year? No Yes Yes Yes Exclusion - non ambulatory  Number falls in past yr: - '1 1 1 '$ -  Injury with Fall? - Yes - No -  Risk for fall due to : - - - History of fall(s);Impaired mobility;Impaired balance/gait -  Follow up - - - Falls evaluation completed;Falls prevention discussed -   Functional Status Survey:    Vitals:   07/13/16 1100  BP: 130/70   Pulse: 63  Temp: 97.9 F (36.6 C)  SpO2: 96%  Weight: 160 lb 6.4 oz (72.8 kg)  Height: '5\' 8"'$  (1.727 m)   Body mass index is 24.39 kg/m. Physical Exam  Constitutional: He is oriented to person, place, and time. He appears well-developed and well-nourished. No distress.  HENT:  Severe hearing loss. Bilateral aides. Right facial droop.   Eyes:  Corrective lenses. Increased exposure of the right cornea due to post CVA facial droop. Irregular pupil on the left  Neck: No JVD present. No tracheal deviation present. No thyromegaly present.  Cardiovascular: Normal rate and normal heart sounds.  Frequent extrasystoles are present.  No murmur heard. Pacemaker left upper chest  Pulmonary/Chest: Effort normal. He has no wheezes. He has rales.  dry rales posterior lung bases.   Abdominal: Soft. Bowel sounds are normal. He exhibits no distension and no mass. There is no tenderness.  Genitourinary:  Genitourinary Comments: .A tender Pilonidal cyst like growth at the right side of anus  Musculoskeletal: Normal range of motion. He exhibits no edema or tenderness.  Unstable gait. Using 4 wheel walker and w/c. Weaker on the right side. Hx of the right total knee replacement. Trace edema BLE, R>L. C/o left knee pain, f/u Ortho. Motorized w/c to go further  Lymphadenopathy:    He has no cervical adenopathy.  Neurological: He is alert and oriented to person, place, and time. He displays normal reflexes. No cranial nerve deficit. He exhibits normal muscle tone. Coordination normal.  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 Recurrent CVA on 04/30/13 of the left MCA area Left facial weakness  Skin: Skin is warm and dry. No rash noted. No erythema. No pallor.  Psychiatric: He has a normal mood and affect. His behavior is normal. Judgment and thought content normal.  Nursing note and vitals reviewed.   Labs reviewed:  Recent Labs  01/26/16 05/05/16 06/15/16    NA 135* 135* 133*  K 4.6 4.8 4.7  BUN 26* 28* 32*  CREATININE 1.2 1.1 1.3    Recent Labs  01/26/16 05/05/16 06/15/16  AST '25 18 23  '$ ALT '17 14 16  '$ ALKPHOS 74 98 82    Recent Labs  01/26/16 05/05/16 06/15/16  WBC 6.8 6.4 7.3  HGB 16.1 16.2 16.2  HCT 47 48 48  PLT 171 195 217   Lab Results  Component Value Date   TSH 4.24 12/31/2015   Lab Results  Component Value Date   HGBA1C 6.3 12/31/2015   Lab Results  Component Value Date   CHOL 117 05/01/2013   HDL 30 (L) 05/01/2013   LDLCALC 71 05/01/2013   TRIG 79 05/01/2013   CHOLHDL 3.9 05/01/2013    Significant Diagnostic Results in last 30 days:  No results found.  Assessment/Plan Hypothyroidism Continue Levothyroxine 192mg, 05/05/16 TSH 2.05   Atrial fibrillation ([facility] Cardiology 05/09/16 showed paced ventricular rhythm.  Rate controlled, continue Metoprolol '100mg'$  bid. Eliquis and ASA '81mg'$  for VTE risk reduction.   HTN (hypertension) Controlled, continue Metoprolol 100 mg bid, Losartan '50mg'$ , Furosemide '40mg'$  qd, Spironolactone '25mg'$ .  Diabetes mellitus, type 2, with circulatory disorder ([place] Controlled. Diet controlled.   Depression due to stroke ([facility] Prn Ativan, seldom used. Sleeps w/o usual hs dose of Ativan.   Chronic systolic congestive heart failure ([facility] compensated clinically, continue Furosemide '40mg'$  qd, prn Furosemide. Spironolactone '25mg'$ , f/u Cardiology, 06/16/16 Na 133, K 4.7, Bun 32, creat 1.29, wbc 7.3, Hgb 16.2, plt 217  Allergic rhinitis Running nose, Atrovent nasal spray  GERD (gastroesophageal reflux disease) Stable, not taking acid reducer.   Constipation Stable, continue daily MiraLax  CKD stage 2 due to type 2 diabetes mellitus ([place] 06/16/16 Na 133, K 4.7, Bun 32, creat 1.29, wbc 7.3, Hgb 16.2, plt 217 Continue Furosemide '40mg'$ , Spironolactone '25mg'$  qd  Cerebral infarction due to embolism of cerebral artery Thalamic CVA 02/18/2013. Residual right side weakness, slurred speech, and  depression. Recurrent CVA on 04/30/13 of the left MCA area Left facial weakness   Osteoarthritis Ambulates with walker, w/c to go further.  Pruritic condition Dry skin, moisturizer prn  Enlarged prostate with lower urinary tract symptoms (LUTS) urinary frequency at night, continue Tamsulosin 0.'4mg'$   Long term current use of anticoagulant therapy Elliquis and ASA for Afib, s/p CVA  Edema Continue Furosemide and Spironolactone. No apparent edema in BLE. Weights stable.   Major depressive disorder, single episode Improved, seldom used prn Ativan.  Gout No flare ups. continue Allopurinol '50mg'$  daily.   Memory change Adequate functioning AL  Foot spasms Prn cramp in legs and arms  Epistaxis, recurrent Injured left septum seen, continue moisturizer   Ectropion of right eye Artificial tears, Patanol.      Family/ staff Communication: AL  Labs/tests ordered:  none

## 2016-07-13 NOTE — Assessment & Plan Note (Signed)
Stable, not taking acid reducer.  

## 2016-07-13 NOTE — Assessment & Plan Note (Signed)
Prn cramp in legs and arms

## 2016-07-13 NOTE — Assessment & Plan Note (Signed)
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 Recurrent CVA on 04/30/13 of the left MCA area Left facial weakness

## 2016-07-13 NOTE — Assessment & Plan Note (Signed)
Ambulates with walker, w/c to go further.  

## 2016-07-13 NOTE — Assessment & Plan Note (Signed)
urinary frequency at night, continue Tamsulosin 0.4mg 

## 2016-07-13 NOTE — Assessment & Plan Note (Signed)
Controlled, continue Metoprolol 100 mg bid, Losartan 50mg , Furosemide 40mg  qd, Spironolactone 25mg .

## 2016-07-13 NOTE — Assessment & Plan Note (Signed)
Controlled. Diet controlled.

## 2016-07-22 DIAGNOSIS — H04123 Dry eye syndrome of bilateral lacrimal glands: Secondary | ICD-10-CM | POA: Diagnosis not present

## 2016-07-22 DIAGNOSIS — H02104 Unspecified ectropion of left upper eyelid: Secondary | ICD-10-CM | POA: Diagnosis not present

## 2016-07-22 DIAGNOSIS — H02101 Unspecified ectropion of right upper eyelid: Secondary | ICD-10-CM | POA: Diagnosis not present

## 2016-07-22 DIAGNOSIS — H524 Presbyopia: Secondary | ICD-10-CM | POA: Diagnosis not present

## 2016-08-10 ENCOUNTER — Encounter: Payer: Self-pay | Admitting: Cardiovascular Disease

## 2016-08-10 ENCOUNTER — Ambulatory Visit (INDEPENDENT_AMBULATORY_CARE_PROVIDER_SITE_OTHER): Payer: Medicare Other | Admitting: Cardiovascular Disease

## 2016-08-10 VITALS — BP 100/64 | HR 66 | Ht 68.0 in | Wt 158.0 lb

## 2016-08-10 DIAGNOSIS — I25810 Atherosclerosis of coronary artery bypass graft(s) without angina pectoris: Secondary | ICD-10-CM | POA: Diagnosis not present

## 2016-08-10 DIAGNOSIS — I10 Essential (primary) hypertension: Secondary | ICD-10-CM

## 2016-08-10 DIAGNOSIS — Z7901 Long term (current) use of anticoagulants: Secondary | ICD-10-CM | POA: Diagnosis not present

## 2016-08-10 DIAGNOSIS — I6340 Cerebral infarction due to embolism of unspecified cerebral artery: Secondary | ICD-10-CM

## 2016-08-10 DIAGNOSIS — I5022 Chronic systolic (congestive) heart failure: Secondary | ICD-10-CM

## 2016-08-10 DIAGNOSIS — N183 Chronic kidney disease, stage 3 (moderate): Secondary | ICD-10-CM | POA: Diagnosis not present

## 2016-08-10 DIAGNOSIS — I4821 Permanent atrial fibrillation: Secondary | ICD-10-CM

## 2016-08-10 DIAGNOSIS — Z950 Presence of cardiac pacemaker: Secondary | ICD-10-CM

## 2016-08-10 DIAGNOSIS — I714 Abdominal aortic aneurysm, without rupture, unspecified: Secondary | ICD-10-CM

## 2016-08-10 DIAGNOSIS — I482 Chronic atrial fibrillation: Secondary | ICD-10-CM | POA: Diagnosis not present

## 2016-08-10 DIAGNOSIS — E1122 Type 2 diabetes mellitus with diabetic chronic kidney disease: Secondary | ICD-10-CM

## 2016-08-10 MED ORDER — METOPROLOL TARTRATE 25 MG PO TABS: 25.0000 mg | ORAL_TABLET | Freq: Two times a day (BID) | ORAL | 3 refills | Status: AC

## 2016-08-10 NOTE — Patient Instructions (Signed)
Dr Sallyanne Kuster has recommended making the following medication changes: 1. DECREASE Metoprolol to 25 mg twice daily  Remote monitoring is used to monitor your Pacemaker or ICD from home. This monitoring reduces the number of office visits required to check your device to one time per year. It allows [name] to keep an eye on the functioning of your device to ensure it is working properly. You are scheduled for a device check from home on Wednesday September 5th, 2018. You may send your transmission at any time that day. If you have a wireless device, the transmission will be sent automatically. After your physician reviews your transmission, you will receive a notification with your next transmission date.  Dr Sallyanne Kuster recommends that you schedule a follow-up appointment in 12 months with a pacemaker check. You will receive a reminder letter in the mail two months in advance. If you don't receive a letter, please call our office to schedule the follow-up appointment.  If you need a refill on your cardiac medications before your next appointment, please call your pharmacy.

## 2016-08-10 NOTE — Progress Notes (Signed)
Patient ID: Jon [name], male   DOB: 15-May-1921, 83 y.o.   MRN: [id number]     Cardiology Office Note    Date:  08/10/2016   ID:  Jon [name], DOB August 12, 1921, MRN [id number]  PCP:  Blanchie Serve, MD  Cardiologist:  Pixie Casino, MD; Sanda Klein, MD   chief complaint: fatigue   History of Present Illness:  Jon [name] is a 83 y.o. male with congestive heart failure, permanent atrial fibrillation, history of previous stroke on chronic anticoagulation, CAD s/p CABG, COPD here for follow-up on his permanent pacemaker (initially implanted for sinus node dysfunction with symptomatic bradycardia, now programmed VVIR).  He last saw Dr. Debara Pickett in April 2018.  He feels that he is doing well. Overall appears have NYHA functional class II. He does not develop shortness of breath with personal self-care activities, but does become short of breath if he has to walk a long distance and sometimes when he is singing in a choir. As on his previous appointment with me his blood pressures relatively low at 100/64, but he states that at home it is usually in the 120/60 range.  The patient specifically denies any chest pain at rest exertion, dyspnea at rest or with exertion, orthopnea, paroxysmal nocturnal dyspnea, syncope, palpitations, focal neurological deficits, intermittent claudication, lower extremity edema, unexplained weight gain, cough, hemoptysis or wheezing.  He has found close companionship with a lady resident at friend's home.  Pacemaker interrogation shows normal function. Battery voltage is 2.90 V, estimated time until generator ERI of 20 months. There has been further increase in the burden of ventricular pacing which is now up to 85%, on the background of permanent atrial fibrillation. He remains quite sedentary. High ventricular rates are not seen.  Past Medical History:  Diagnosis Date  . Abnormal PFTs 10/30/2012   Followed in Pulmonary clinic/ [facility]/ [place]   - PFT's 10/22/12 VC  55% no obst, DLCO 50%  - PFT's 12/20/2012 VC 83% and no obst, dLCO 55%  -11/08/2012  Walked RA x 3 laps @ 185 ft each stopped due to  End of study, not desat -11/08/12 esr 10    . Abnormality of gait 04/18/2013  . Acute on chronic systolic congestive heart failure, NYHA class 2 ([place]) 03/16/2006   BNP 376.9 05/28/13   . Adjustment disorder with mixed anxiety and depressed mood 04/18/2013   Post CVA depression and anxiety   . Allergy   . Anxiety   . Aortic aneurysm of unspecified site without mention of rupture 10/27/2011  . Arthritis   . Atherosclerosis of renal artery ([place]) 10/18/2006  . Atrial fibrillation ([place]) 03/04/2011    He was on coumadin until his stroke last December and was switched to apixaban 2.56m daily which he has been taking faithfully without reported side effects.    . Balance disorder 07/31/2014  . Basal cell carcinoma of skin of other and unspecified parts of face 12/24/2009  . Blood transfusion without reported diagnosis   . Cardiac pacemaker in situ- MDT 10/11 03/18/2010   Medtronic revo implant in October 2011. Severe sinus bradycardia in the 30s, but not truly pacemaker dependent   . Cataract   . CKD stage 2 due to type 2 diabetes mellitus ([place] 07/03/2014  . Coronary artery disease   . CVA - Rt brain stroke 12/14 02/18/2013   02/19/13 angiography CT head:  Diffuse atherosclerotic irregularity and plaque formation of the distal right common carotid artery and proximal internal carotid artery  without significant stenosis. Plaque ulceration is present and is a source of emboli. A small right thalamic CVA    . DM (diabetes mellitus), type 2 with renal complications ([place]) 08/27/6331  . Dyslipidemia 11/03/2004  . Fall 03/04/2011  . Fatigue 04/18/2013  . Gout, unspecified 11/03/2004  . Heart murmur   . History of abdominal aortic aneurysm   . History of Doppler ultrasound 05/22/2012   LEAs; R anterior tibial artery appeared occluded; L posterior tibial shows short  segment of occlusive ds  . History of Doppler ultrasound 05/22/2012   Abdominal Aortic Doppler; slight increase in fusiform aneurysm   . History of echocardiogram 12/2008   EF >55%; mild concentric LVH; mild MR; mild-mod TR; mild AV regurg;   . History of nuclear stress test 12/2010   lexiscan; low risk; compared to prior study, perfusion improved  . HTN (hypertension) 03/04/2011  . Hyperlipidemia   . Hypertension   . Hypertrophy of prostate with urinary obstruction and other lower urinary tract symptoms (LUTS) 04/28/2004  . Hyponatremia 03/04/2011  . Hypothyroidism 03/04/2011  . Impotence of organic origin 10/03/2000  . Internal hemorrhoids without mention of complication 54/56/2563  . Long term (current) use of anticoagulants 03/17/2011  . Major depressive disorder, single episode, unspecified 03/05/2009  . Memory change 01/24/2013  . Muscle weakness (generalized) 03/10/2011  . Nocturia 10/27/2011  . Obstructive sleep apnea (adult) (pediatric) 07/23/2008  . Open wound of knee, leg (except thigh), and ankle, complicated 8/93/7342  . Osteoarthritis of both knees 07/31/2014  . Osteoarthrosis, unspecified whether generalized or localized, unspecified site 02/05/2003  . PAF (paroxysmal atrial fibrillation) (HCC)    coumadin  . Pain in joint, lower leg 08/04/2011  . Pruritic condition 01/24/2013  . Quadriceps weakness 07/31/2014  . Right bundle branch block 04/19/2006  . 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 Spinal stenosis in cervical region 12/15/2004  . SSS (sick sinus syndrome) ([place]) 04/08/2014  . Stroke ([place])   . Thyroid disease   . TIA (transient ischemic attack) 04/30/2013  . Type II or unspecified type diabetes mellitus without mention of complication, not stated as uncontrolled 11/24/2004  . Unspecified vitamin D deficiency 10/18/2006  . 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 Increased exposure of the right cornea.       Past Surgical History:  Procedure Laterality Date  . CORONARY ANGIOPLASTY WITH STENT PLACEMENT  2008   stent to SVG to OM  . CORONARY ARTERY BYPASS GRAFT  1989  . EYE SURGERY    . HERNIA REPAIR    . INSERT / REPLACE / REMOVE PACEMAKER    . JOINT REPLACEMENT    . PACEMAKER INSERTION  2011    Current Medications: Outpatient Medications Prior to Visit  Medication Sig Dispense Refill  . allopurinol (ZYLOPRIM) 100 MG tablet Take 50 mg by mouth daily.     Marland Kitchen amoxicillin (AMOXIL) 500 MG capsule Take 500 mg by mouth. Take 4 caps prior to dental procedures    . apixaban (ELIQUIS) 2.5 MG TABS tablet Take 1 tablet (2.5 mg total) by mouth 2 (two) times daily. 60 tablet 11  . aspirin EC 81 MG tablet Take 81 mg by mouth daily.    . carboxymethylcellulose (REFRESH PLUS) 0.5 % SOLN Place 1 drop into both eyes every evening.    . Cholecalciferol (VITAMIN D) 400 UNITS capsule Take 2,000 Units by mouth daily.     . furosemide (LASIX)  40 MG tablet Take 40 mg by mouth daily. Take one tablet as needed if weight gain 3  Lbs in 24 hours or 5 lbs in a week    . ipratropium (ATROVENT) 0.06 % nasal spray Place 2 sprays into both nostrils as directed. Uses 1-2x daily    . levothyroxine (SYNTHROID, LEVOTHROID) 125 MCG tablet Take 125 mcg by mouth daily before breakfast.    . LORazepam (ATIVAN) 0.5 MG tablet Take 0.25 mg by mouth. Take 1/2 tablet once a day; take 1/2 tablet every four hours as needed    . losartan (COZAAR) 25 MG tablet Take 1 tablet (25 mg total) by mouth daily. 90 tablet 3  . meclizine (ANTIVERT) 25 MG tablet Take 25 mg by mouth. Take one tablet every 6 hours as needed for dizziness    . Menthol, Topical Analgesic, (BIOFREEZE) 4 % GEL Apply topically. Apply to left knee four times daily as needed for pain    . nitroGLYCERIN (NITROSTAT) 0.4 MG SL tablet Place 0.4 mg under the tongue every 5 (five) minutes as needed for chest pain.     Marland Kitchen olopatadine (PATANOL) 0.1 % ophthalmic solution Place 1  drop into both eyes 2 (two) times daily as needed for allergies.    . polyethylene glycol (MIRALAX / GLYCOLAX) packet Take 17 g by mouth daily.    . RESOURCE BENEPROTEIN PO Take 15 mLs by mouth daily as needed.    Marland Kitchen spironolactone (ALDACTONE) 25 MG tablet Take 25 mg by mouth daily.    . tamsulosin (FLOMAX) 0.4 MG CAPS capsule Take one cap by mouth daily    . tobramycin-dexamethasone (TOBRADEX) ophthalmic ointment Place 1 application into both eyes 2 (two) times daily.    . metoprolol (LOPRESSOR) 100 MG tablet Take 50 mg by mouth 2 (two) times daily.     Marland Kitchen triamcinolone cream (KENALOG) 0.1 % Apply 1 application topically daily.     No facility-administered medications prior to visit.      Allergies:   Quinolones; Caduet [amlodipine-atorvastatin]; Crestor [rosuvastatin]; Lipitor [atorvastatin calcium]; Simvastatin; and Lisinopril   Social History   Social History  . Marital status: Married    Spouse name: [name]  . Number of children: 3  . Years of education: BA   Occupational History  . Retired Armed forces operational officer    Social History Main Topics  . Smoking status: Former Smoker    Packs/day: 0.75    Years: 15.00    Types: Cigarettes    Quit date: 03/04/1967  . Smokeless tobacco: Never Used  . Alcohol use No  . Drug use: No  . Sexual activity: No   Other Topics Concern  . None   Social History Narrative   [facility] since 03/[facility], moved to [place] 04/2013   Widowed    Stopped smoking 1968   Alcohol none   Exercise  none   Walks with walker   Living Will, POA              Family History:  The patient's family history includes Diabetes in his father; Other in his mother.   ROS:   Please see the history of present illness.    ROS All other systems reviewed and are negative.   PHYSICAL EXAM:   VS:  BP 100/64 (BP Location: Left Arm, Patient Position: Sitting, Cuff Size: Normal)   Pulse 66   Ht '5\' 8"'  (1.727 m)   Wt 158 lb (71.7 kg)   BMI 24.02 kg/m  General: Alert, oriented x3, no distress. Well nourished, well developed, Head: no evidence of trauma, PERRL, EOMI, no exophtalmos or lid lag, no myxedema, no xanthelasma; normal ears, nose and oropharynx Neck: normal jugular venous pulsations and no hepatojugular reflux; brisk carotid pulses without delay and no carotid bruits Chest: clear to auscultation, no signs of consolidation by percussion or palpation, normal fremitus, symmetrical and full respiratory excursions Cardiovascular: normal position and quality of the apical impulse, regular rhythm, normal first and paradoxically split second heart sounds, no murmurs, rubs or gallops, healthy subclavian pacemaker site Abdomen: no tenderness or distention, no masses by palpation, no abnormal pulsatility or arterial bruits, normal bowel sounds, no hepatosplenomegaly Extremities: no clubbing, cyanosis or edema; 2+ radial, ulnar and brachial pulses bilaterally; 2+ right femoral, posterior tibial and dorsalis pedis pulses; 2+ left femoral, posterior tibial and dorsalis pedis pulses; no subclavian or femoral bruits Neurological: Mild right facial droop, strabismus, barely noticeable right limb weakness  Psych: euthymic mood, full affect  Wt Readings from Last 3 Encounters:  08/10/16 158 lb (71.7 kg)  07/13/16 160 lb 6.4 oz (72.8 kg)  06/17/16 157 lb 9.6 oz (71.5 kg)      Studies/Labs Reviewed:   EKG:  EKG is not ordered today.  The intracardiac electrogram today demonstrates atrial fibrillation, paced ventricular rhythm  Recent Labs: 12/31/2015: TSH 4.24 06/15/2016: ALT 16; BUN 32; Creatinine 1.3; Hemoglobin 16.2; Platelets 217; Potassium 4.7; Sodium 133   Lipid Panel    Component Value Date/Time   CHOL 117 05/01/2013 0528   TRIG 79 05/01/2013 0528   HDL 30 (L) 05/01/2013 0528   CHOLHDL 3.9 05/01/2013 0528   VLDL 16 05/01/2013 0528   LDLCALC 71 05/01/2013 0528    ASSESSMENT:    1. Chronic systolic congestive heart failure ([place])    2. Coronary artery disease involving autologous vein coronary bypass graft without angina pectoris   3. Essential hypertension   4. Type 2 diabetes mellitus with stage 3 chronic kidney disease, without long-term current use of insulin ([place])   5. Permanent atrial fibrillation (HCC)   6. Cardiac pacemaker in situ   7. Abdominal aortic aneurysm (AAA) without rupture ([place])   8. Long term current use of anticoagulant therapy     PLAN:  In order of problems listed above:  1. CHF: He does not have any clinical evidence of hypervolemia or overt symptoms of heart failure, but has a very sedentary lifestyle. 2. CAD: Remote history of bypass surgery and 10 years since last revascularization with a stent to SVG. No angina  3. Hypotension: Blood pressure is borderline in the office today, but higher when checked at friends home and was 130/70 when he last saw his primary care provider. I plan to reduce his beta blocker today. If his blood pressure increases over 1:30/80, can increase the losartan again. 4. DM/CKD: Stable creatinine around 1.3 recently. Reviewed the relationship between volume and renal status and the relative conflict with heart failure management. 5. AFib: Rate control is excessive and has led to a high prevalence of ventricular pacing. This may lead to premature generator drain as well as potentially worsening left ventricular function. We'll reduce the dose of metoprolol to 25 mg twice daily. CHADSVasc 8 (age 25, CVA 2, HTN, DM, CHF, CAD and AAA). Continue Eliquis.  6. PPM: Normal device function, continue remote download Q3 months and yearly office visits. Reduce the beta blocker dose due to 85% ventricular pacing 7. AAA: Monitored by ultrasound, no change in size.  8. No bleeding complications on apixaban.   Medication Adjustments/Labs and Tests Ordered: Current medicines are reviewed at length with the patient today.  Concerns regarding medicines are outlined above.  Medication  changes, Labs and Tests ordered today are listed in the Patient Instructions below. Patient Instructions  Dr Sallyanne Kuster has recommended making the following medication changes: 1. DECREASE Metoprolol to 25 mg twice daily  Remote monitoring is used to monitor your Pacemaker or ICD from home. This monitoring reduces the number of office visits required to check your device to one time per year. It allows [name] to keep an eye on the functioning of your device to ensure it is working properly. You are scheduled for a device check from home on Wednesday September 5th, 2018. You may send your transmission at any time that day. If you have a wireless device, the transmission will be sent automatically. After your physician reviews your transmission, you will receive a notification with your next transmission date.  Dr Sallyanne Kuster recommends that you schedule a follow-up appointment in 12 months with a pacemaker check. You will receive a reminder letter in the mail two months in advance. If you don't receive a letter, please call our office to schedule the follow-up appointment.  If you need a refill on your cardiac medications before your next appointment, please call your pharmacy.    Signed, Sanda Klein, MD  08/10/2016 1:53 PM    [facility] [address], [place], [place]  [address] Phone: [phone]; Fax: [phone]

## 2016-08-16 ENCOUNTER — Other Ambulatory Visit: Payer: Self-pay | Admitting: Cardiovascular Disease

## 2016-08-18 LAB — CUP PACEART INCLINIC DEVICE CHECK
Battery Voltage: 2.9 V
Implantable Lead Implant Date: 20111021
Implantable Lead Implant Date: 20111021
Implantable Lead Location: 753860
Lead Channel Impedance Value: 440 Ohm
Lead Channel Pacing Threshold Amplitude: 1 V
Lead Channel Pacing Threshold Pulse Width: 0.4 ms
Lead Channel Setting Pacing Pulse Width: 0.4 ms
Lead Channel Setting Sensing Sensitivity: 0.9 mV
MDC IDC LEAD LOCATION: 753859
MDC IDC MSMT LEADCHNL RA IMPEDANCE VALUE: 408 Ohm
MDC IDC MSMT LEADCHNL RA SENSING INTR AMPL: 0.8 mV
MDC IDC MSMT LEADCHNL RV SENSING INTR AMPL: 9.1 mV
MDC IDC PG IMPLANT DT: 20111021
MDC IDC SESS DTM: 20180614103404
MDC IDC SET LEADCHNL RV PACING AMPLITUDE: 2.5 V

## 2016-09-08 ENCOUNTER — Non-Acute Institutional Stay: Payer: Medicare Other | Admitting: Internal Medicine

## 2016-09-08 ENCOUNTER — Encounter: Payer: Self-pay | Admitting: Internal Medicine

## 2016-09-08 DIAGNOSIS — M79605 Pain in left leg: Secondary | ICD-10-CM | POA: Diagnosis not present

## 2016-09-08 DIAGNOSIS — M25572 Pain in left ankle and joints of left foot: Secondary | ICD-10-CM | POA: Diagnosis not present

## 2016-09-08 NOTE — Progress Notes (Signed)
Location:  [facility] Room Number: 902-A Place of Service:  ALF [phone]) Provider:  Blanchie Serve, MD  Blanchie Serve, MD  Patient Care Team: Blanchie Serve, MD as PCP - General (Internal Medicine) [place], Nadean Corwin, MD as Consulting Physician (Cardiology) Ardis Hughs, MD as Attending Physician (Urology)  Extended Emergency Contact Information Primary Emergency Contact: [name] of [place] Phone: [phone] Relation: Daughter Secondary Emergency Contact: Eyvonne Mechanic [place] of [place] Phone: [phone] Mobile Phone: [phone] Relation: Daughter  Code Status:  DNR Goals of care: Advanced Directive information Advanced Directives 07/13/2016  Does Patient Have a Medical Advance Directive? Yes  Type of Paramedic of [place];Living will;Out of facility DNR (pink MOST or yellow form)  Does patient want to make changes to medical advance directive? No - Patient declined  Copy of [place] in Chart? Yes  Pre-existing out of facility DNR order (yellow form or pink MOST form) Yellow form placed in chart (order not valid for inpatient use)     Chief Complaint  Patient presents with  . Acute Visit    Pain to left ankle    HPI:  Pt is a 83 y.o. male seen today for an acute visit for left ankle pain since last night. He woke up at 2-2:30 am and experienced left lower leg and ankle pain. He describes the pain to be shooting nature, radiating from lower leg to foot and intermittent. No pain to right leg. Denies any fall or trauma. Denies pain at present. Denies noticing any redness or swelling to his toes, ankle or leg. He is sitting on his recliner chair at present.    Past Medical History:  Diagnosis Date  . Abnormal PFTs 10/30/2012   Followed in Pulmonary clinic/ [facility]/ [place]  - PFT's 10/22/12 VC  55% no obst, DLCO 50%  - PFT's  12/20/2012 VC 83% and no obst, dLCO 55%  -11/08/2012  Walked RA x 3 laps @ 185 ft each stopped due to  End of study, not desat -11/08/12 esr 10    . Abnormality of gait 04/18/2013  . Acute on chronic systolic congestive heart failure, NYHA class 2 ([place]) 03/16/2006   BNP 376.9 05/28/13   . Adjustment disorder with mixed anxiety and depressed mood 04/18/2013   Post CVA depression and anxiety   . Allergy   . Anxiety   . Aortic aneurysm of unspecified site without mention of rupture 10/27/2011  . Arthritis   . Atherosclerosis of renal artery ([place]) 10/18/2006  . Atrial fibrillation ([place]) 03/04/2011    He was on coumadin until his stroke last December and was switched to apixaban 2.'5mg'$  daily which he has been taking faithfully without reported side effects.    . Balance disorder 07/31/2014  . Basal cell carcinoma of skin of other and unspecified parts of face 12/24/2009  . Blood transfusion without reported diagnosis   . Cardiac pacemaker in situ- MDT 10/11 03/18/2010   Medtronic revo implant in October 2011. Severe sinus bradycardia in the 30s, but not truly pacemaker dependent   . Cataract   . CKD stage 2 due to type 2 diabetes mellitus ([place]) 07/03/2014  . Coronary artery disease   . CVA - Rt brain stroke 12/14 02/18/2013   02/19/13 angiography CT head:  Diffuse atherosclerotic irregularity and plaque formation of the distal right common carotid artery and proximal internal carotid artery without significant stenosis. Plaque ulceration is present  and is a source of emboli. A small right thalamic CVA    . DM (diabetes mellitus), type 2 with renal complications ([place]) 10/29/2351  . Dyslipidemia 11/03/2004  . Fall 03/04/2011  . Fatigue 04/18/2013  . Gout, unspecified 11/03/2004  . Heart murmur   . History of abdominal aortic aneurysm   . History of Doppler ultrasound 05/22/2012   LEAs; R anterior tibial artery appeared occluded; L posterior tibial shows short segment of occlusive ds  . History of Doppler  ultrasound 05/22/2012   Abdominal Aortic Doppler; slight increase in fusiform aneurysm   . History of echocardiogram 12/2008   EF >55%; mild concentric LVH; mild MR; mild-mod TR; mild AV regurg;   . History of nuclear stress test 12/2010   lexiscan; low risk; compared to prior study, perfusion improved  . HTN (hypertension) 03/04/2011  . Hyperlipidemia   . Hypertension   . Hypertrophy of prostate with urinary obstruction and other lower urinary tract symptoms (LUTS) 04/28/2004  . Hyponatremia 03/04/2011  . Hypothyroidism 03/04/2011  . Impotence of organic origin 10/03/2000  . Internal hemorrhoids without mention of complication 61/44/3154  . Long term (current) use of anticoagulants 03/17/2011  . Major depressive disorder, single episode, unspecified 03/05/2009  . Memory change 01/24/2013  . Muscle weakness (generalized) 03/10/2011  . Nocturia 10/27/2011  . Obstructive sleep apnea (adult) (pediatric) 07/23/2008  . Open wound of knee, leg (except thigh), and ankle, complicated 0/10/6759  . Osteoarthritis of both knees 07/31/2014  . Osteoarthrosis, unspecified whether generalized or localized, unspecified site 02/05/2003  . PAF (paroxysmal atrial fibrillation) (HCC)    coumadin  . Pain in joint, lower leg 08/04/2011  . Pruritic condition 01/24/2013  . Quadriceps weakness 07/31/2014  . Right bundle branch block 04/19/2006  . 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 Spinal stenosis in cervical region 12/15/2004  . SSS (sick sinus syndrome) ([place]) 04/08/2014  . Stroke ([place])   . Thyroid disease   . TIA (transient ischemic attack) 04/30/2013  . Type II or unspecified type diabetes mellitus without mention of complication, not stated as uncontrolled 11/24/2004  . Unspecified vitamin D deficiency 10/18/2006  . 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 Increased exposure of the right cornea.    Past Surgical History:  Procedure  Laterality Date  . CORONARY ANGIOPLASTY WITH STENT PLACEMENT  2008   stent to SVG to OM  . CORONARY ARTERY BYPASS GRAFT  1989  . EYE SURGERY    . HERNIA REPAIR    . INSERT / REPLACE / REMOVE PACEMAKER    . JOINT REPLACEMENT    . PACEMAKER INSERTION  2011    Allergies  Allergen Reactions  . Quinolones Other (See Comments)    Risk of rupture of AAA  . Caduet [Amlodipine-Atorvastatin] Other (See Comments)    Weakness   . Crestor [Rosuvastatin] Other (See Comments)    Muscle pain/weakness  . Lipitor [Atorvastatin Calcium] Other (See Comments)    Muscle pain/weakness  . Simvastatin Other (See Comments)    Muscle pain/weakness  . Lisinopril Other (See Comments)    Doesn't remember reaction    Outpatient Encounter Prescriptions as of 09/08/2016  Medication Sig  . allopurinol (ZYLOPRIM) 100 MG tablet Take 50 mg by mouth daily.   Marland Kitchen amoxicillin (AMOXIL) 500 MG capsule Take 500 mg by mouth. Take 4 caps prior to dental procedures  . apixaban (ELIQUIS) 2.5 MG TABS tablet Take 1 tablet (2.5  mg total) by mouth 2 (two) times daily.  Marland Kitchen aspirin EC 81 MG tablet Take 81 mg by mouth daily.  . carboxymethylcellulose (REFRESH PLUS) 0.5 % SOLN Place 1 drop into both eyes 3 (three) times daily. May also do daily as needed  . Cholecalciferol 1000 units tablet Take 2,000 Units by mouth daily.  Marland Kitchen Dextran 70-Hypromellose 0.1-0.3 % SOLN Apply 1 drop to eye 2 (two) times daily as needed.  . furosemide (LASIX) 20 MG tablet Take 20 mg by mouth as needed. If patient has gained 3 lbs in 24 hours or 5 lbs in a week  . furosemide (LASIX) 40 MG tablet Take 40 mg by mouth daily.   Marland Kitchen ipratropium (ATROVENT) 0.06 % nasal spray Place 1 spray into both nostrils 3 (three) times daily as needed.   Marland Kitchen ipratropium-albuterol (DUONEB) 0.5-2.5 (3) MG/3ML SOLN Take 3 mLs by nebulization 4 (four) times daily as needed.  Marland Kitchen levothyroxine (SYNTHROID, LEVOTHROID) 125 MCG tablet Take 125 mcg by mouth daily before breakfast.  .  LORazepam (ATIVAN) 0.5 MG tablet Take 0.25 mg by mouth every 4 (four) hours as needed.   Marland Kitchen losartan (COZAAR) 25 MG tablet Take 1 tablet (25 mg total) by mouth daily.  . meclizine (ANTIVERT) 25 MG tablet Take 25 mg by mouth every 6 (six) hours as needed.   . Menthol, Topical Analgesic, (BIOFREEZE) 4 % GEL Apply topically. Apply to left knee four times daily as needed for pain  . metoprolol tartrate (LOPRESSOR) 25 MG tablet Take 1 tablet (25 mg total) by mouth 2 (two) times daily.  Marland Kitchen Neomycin-Bacitracin-Polymyxin (TRIPLE ANTIBIOTIC) [phone] OINT Apply 1 application topically daily as needed (apply to bilateral nostrils).  . nitroGLYCERIN (NITROSTAT) 0.4 MG SL tablet Place 0.4 mg under the tongue every 5 (five) minutes as needed for chest pain.   Marland Kitchen olopatadine (PATANOL) 0.1 % ophthalmic solution Place 1 drop into both eyes 2 (two) times daily as needed for allergies.  . OXYGEN Inhale 2 L into the lungs continuous.  . polyethylene glycol (MIRALAX / GLYCOLAX) packet Take 17 g by mouth daily as needed.   . RESOURCE BENEPROTEIN PO Take 15 mLs by mouth daily as needed.  Marland Kitchen spironolactone (ALDACTONE) 25 MG tablet Take 25 mg by mouth daily.  . tamsulosin (FLOMAX) 0.4 MG CAPS capsule Take one cap by mouth daily  . tobramycin-dexamethasone (TOBRADEX) ophthalmic ointment Place 1 application into both eyes daily as needed.   Marland Kitchen UNABLE TO FIND Med Name: Hydrocortisone cream with applicator 1%. Apply to hemorrhoids 2 times daily as needed  . [DISCONTINUED] Cholecalciferol (VITAMIN D) 400 UNITS capsule Take 2,000 Units by mouth daily.   . [DISCONTINUED] triamcinolone cream (KENALOG) 0.1 % Apply 1 application topically daily.   No facility-administered encounter medications on file as of 09/08/2016.     Review of Systems  Constitutional: Negative for appetite change and fever.  HENT: Negative for congestion, rhinorrhea and sore throat.   Eyes:       Chronic right eye ectropion  Respiratory: Negative for  cough and shortness of breath.   Cardiovascular: Negative for chest pain and palpitations.  Gastrointestinal: Negative for abdominal pain, nausea and vomiting.  Genitourinary: Negative for dysuria.  Musculoskeletal: Positive for back pain.  Psychiatric/Behavioral: Negative for behavioral problems.    Immunization History  Administered Date(s) Administered  . Influenza Whole 12/06/2011, 12/19/2012  . Influenza-Unspecified 01/09/2014, 11/25/2014, 12/23/2015  . PPD Test 05/03/2013  . Pneumococcal Polysaccharide-23 03/07/1986  . Td 04/20/2005   Pertinent  Health  Maintenance Due  Topic Date Due  . OPHTHALMOLOGY EXAM  01/12/1932  . PNA vac Low Risk Adult (1 of 2 - PCV13) 01/12/1989  . FOOT EXAM  07/31/2015  . HEMOGLOBIN A1C  06/30/2016  . INFLUENZA VACCINE  10/05/2016   Fall Risk  10/15/2015 06/11/2015 10/23/2014 07/07/2014 02/17/2014  Falls in the past year? No Yes Yes Yes Exclusion - non ambulatory  Number falls in past yr: - '1 1 1 '$ -  Injury with Fall? - Yes - No -  Risk for fall due to : - - - History of fall(s);Impaired mobility;Impaired balance/gait -  Follow up - - - Falls evaluation completed;Falls prevention discussed -   Functional Status Survey:    Vitals:   09/08/16 1148  BP: 120/60  Pulse: 68  Resp: 20  Temp: 98.2 F (36.8 C)  TempSrc: Oral  SpO2: 98%  Weight: 156 lb 3.2 oz (70.9 kg)  Height: '5\' 8"'$  (1.727 m)   Body mass index is 23.75 kg/m. Physical Exam  Constitutional: He is oriented to person, place, and time. He appears well-developed and well-nourished. No distress.  HENT:  Head: Normocephalic and atraumatic.  Mouth/Throat: Oropharynx is clear and moist.  Eyes: Pupils are equal, round, and reactive to light.  Right ectropion  Neck: Normal range of motion. Neck supple. No thyromegaly present.  Cardiovascular: Normal rate and regular rhythm.   Pulmonary/Chest: Effort normal and breath sounds normal.  Abdominal: Soft. Bowel sounds are normal. There is no  tenderness.  Musculoskeletal: He exhibits no edema.  Can move all 4 extremities, has wheelchair and rolling walker. Normal plantar flexion and dorsiflexion. No swelling or redness noted to his legs or joints. Normal skin temperature.   Lymphadenopathy:    He has no cervical adenopathy.  Neurological: He is alert and oriented to person, place, and time.  Skin: Skin is warm and dry. He is not diaphoretic.  Psychiatric: He has a normal mood and affect.    Labs reviewed:  Recent Labs  01/26/16 05/05/16 06/15/16  NA 135* 135* 133*  K 4.6 4.8 4.7  BUN 26* 28* 32*  CREATININE 1.2 1.1 1.3    Recent Labs  01/26/16 05/05/16 06/15/16  AST '25 18 23  '$ ALT '17 14 16  '$ ALKPHOS 74 98 82    Recent Labs  01/26/16 05/05/16 06/15/16  WBC 6.8 6.4 7.3  HGB 16.1 16.2 16.2  HCT 47 48 48  PLT 171 195 217   Lab Results  Component Value Date   TSH 4.24 12/31/2015   Lab Results  Component Value Date   HGBA1C 6.3 12/31/2015   Lab Results  Component Value Date   CHOL 117 05/01/2013   HDL 30 (L) 05/01/2013   LDLCALC 71 05/01/2013   TRIG 79 05/01/2013   CHOLHDL 3.9 05/01/2013    Significant Diagnostic Results in last 30 days:  No results found.  Assessment/Plan  Left leg pain Pain free at present. Normal ROM with joints. No leg edema noted. With shooting nature and intermittent pain, history of cva, dm and hypothyroidism, concern for neuropathic pain. Check b12, tsh level.  Has history of low back pain and another possibility is of radiculopathy with spinal stenosis. Remains pain free at present. monitor for now. If recurs, will need to reassess.   Left ankle pain Has history of gout, no clinical sign of gout flare up on exam. Currently on allopurinol 50 mg daily, check uric acid level given him being on diuresis and hx of gout.  Family/  staff Communication: reviewed care plan with patient and charge nurse.    Labs/tests ordered:  TSH, b12, uric acid level  Blanchie Serve, MD Internal  Medicine [facility] [address] [place], [place] [address] Cell Phone (Monday-Friday 8 am - 5 pm): [phone] On Call: [phone] and follow prompts after 5 pm and on weekends Office Phone: [phone] Office Fax: [phone]

## 2016-09-13 DIAGNOSIS — E039 Hypothyroidism, unspecified: Secondary | ICD-10-CM | POA: Diagnosis not present

## 2016-09-13 DIAGNOSIS — E119 Type 2 diabetes mellitus without complications: Secondary | ICD-10-CM | POA: Diagnosis not present

## 2016-09-13 DIAGNOSIS — I10 Essential (primary) hypertension: Secondary | ICD-10-CM | POA: Diagnosis not present

## 2016-09-13 DIAGNOSIS — D649 Anemia, unspecified: Secondary | ICD-10-CM | POA: Diagnosis not present

## 2016-09-13 LAB — TSH: TSH: 1.9 (ref <=5.90)

## 2016-09-14 LAB — VITAMIN B12: VITAMIN B 12: 516

## 2016-09-15 ENCOUNTER — Other Ambulatory Visit: Payer: Self-pay | Admitting: *Deleted

## 2016-09-27 ENCOUNTER — Non-Acute Institutional Stay: Payer: Medicare Other | Admitting: Nurse Practitioner

## 2016-09-27 ENCOUNTER — Encounter: Payer: Self-pay | Admitting: Internal Medicine

## 2016-09-27 ENCOUNTER — Encounter: Payer: Self-pay | Admitting: Nurse Practitioner

## 2016-09-27 DIAGNOSIS — E039 Hypothyroidism, unspecified: Secondary | ICD-10-CM

## 2016-09-27 DIAGNOSIS — H02122 Mechanical ectropion of right lower eyelid: Secondary | ICD-10-CM

## 2016-09-27 DIAGNOSIS — I5022 Chronic systolic (congestive) heart failure: Secondary | ICD-10-CM | POA: Diagnosis not present

## 2016-09-27 DIAGNOSIS — N62 Hypertrophy of breast: Secondary | ICD-10-CM

## 2016-09-27 DIAGNOSIS — R040 Epistaxis: Secondary | ICD-10-CM | POA: Diagnosis not present

## 2016-09-27 NOTE — Progress Notes (Signed)
Location:  [facility] Room Number: 902-A Place of Service:  ALF [phone]) Provider:  Cumi Sanagustin, Manxie  NP  Blanchie Serve, MD  Patient Care Team: Blanchie Serve, MD as PCP - General (Internal Medicine) [place], [facility], Nadean Corwin, MD as Consulting Physician (Cardiology) Ardis Hughs, MD as Attending Physician (Urology)  Extended Emergency Contact Information Primary Emergency Contact: [name] of [place] Phone: [phone] Relation: Daughter Secondary Emergency Contact: Eyvonne Mechanic [place] of [place] Phone: [phone] Mobile Phone: [phone] Relation: Daughter  Code Status:  Full Code Goals of care: Advanced Directive information Advanced Directives 09/27/2016  Does Patient Have a Medical Advance Directive? Yes  Type of Paramedic of [place];Out of facility DNR (pink MOST or yellow form);Living will  Does patient want to make changes to medical advance directive? No - Patient declined  Copy of [place] in Chart? Yes  Pre-existing out of facility DNR order (yellow form or pink MOST form) Yellow form placed in chart (order not valid for inpatient use)     Chief Complaint  Patient presents with  . Acute Visit    Burning eyes    HPI:  Pt is a 83 y.o. male seen today for an acute visit for c/o flare up eye irritation, Hx of the right lower eyelid ectropion, no conjunctival injection, no apparent drainage, no change in vision. C/o enlarged tender breasts for over 3-6 months, no lump noted, no nipple discharge.   Hx of CHF, f/u Cardiology regularly, taking Furosemide and Spironolactone. Hypothyroidism: Levothyroxine 188mg, last TSH 1.9 09/14/16.     Past Medical History:  Diagnosis Date  . Abnormal PFTs 10/30/2012   Followed in Pulmonary clinic/  Healthcare/ [facility]  - PFT's 10/22/12 VC  55% no obst, DLCO 50%  - PFT's 12/20/2012 VC 83% and no  obst, dLCO 55%  -11/08/2012  Walked RA x 3 laps @ 185 ft each stopped due to  End of study, not desat -11/08/12 esr 10    . Abnormality of gait 04/18/2013  . Acute on chronic systolic congestive heart failure, NYHA class 2 ([facility] 03/16/2006   BNP 376.9 05/28/13   . Adjustment disorder with mixed anxiety and depressed mood 04/18/2013   Post CVA depression and anxiety   . Allergy   . Anxiety   . Aortic aneurysm of unspecified site without mention of rupture 10/27/2011  . Arthritis   . Atherosclerosis of renal artery ([facility] 10/18/2006  . Atrial fibrillation ([facility] 03/04/2011    He was on coumadin until his stroke last December and was switched to apixaban 2.568mdaily which he has been taking faithfully without reported side effects.    . Balance disorder 07/31/2014  . Basal cell carcinoma of skin of other and unspecified parts of face 12/24/2009  . Blood transfusion without reported diagnosis   . Cardiac pacemaker in situ- MDT 10/11 03/18/2010   Medtronic revo implant in October 2011. Severe sinus bradycardia in the 30s, but not truly pacemaker dependent   . Cataract   . CKD stage 2 due to type 2 diabetes mellitus ([facility]/28/2016  . Coronary artery disease   . CVA - Rt brain stroke 12/14 02/18/2013   02/19/13 angiography CT head:  Diffuse atherosclerotic irregularity and plaque formation of the distal right common carotid artery and proximal internal carotid artery without significant stenosis. Plaque ulceration is present and is a source of emboli. A small right thalamic CVA    .  DM (diabetes mellitus), type 2 with renal complications ([place]) 06/18/2438  . Dyslipidemia 11/03/2004  . Fall 03/04/2011  . Fatigue 04/18/2013  . Gout, unspecified 11/03/2004  . Heart murmur   . History of abdominal aortic aneurysm   . History of Doppler ultrasound 05/22/2012   LEAs; R anterior tibial artery appeared occluded; L posterior tibial shows short segment of occlusive ds  . History of Doppler ultrasound 05/22/2012    Abdominal Aortic Doppler; slight increase in fusiform aneurysm   . History of echocardiogram 12/2008   EF >55%; mild concentric LVH; mild MR; mild-mod TR; mild AV regurg;   . History of nuclear stress test 12/2010   lexiscan; low risk; compared to prior study, perfusion improved  . HTN (hypertension) 03/04/2011  . Hyperlipidemia   . Hypertension   . Hypertrophy of prostate with urinary obstruction and other lower urinary tract symptoms (LUTS) 04/28/2004  . Hyponatremia 03/04/2011  . Hypothyroidism 03/04/2011  . Impotence of organic origin 10/03/2000  . Internal hemorrhoids without mention of complication 01/01/2535  . Long term (current) use of anticoagulants 03/17/2011  . Major depressive disorder, single episode, unspecified 03/05/2009  . Memory change 01/24/2013  . Muscle weakness (generalized) 03/10/2011  . Nocturia 10/27/2011  . Obstructive sleep apnea (adult) (pediatric) 07/23/2008  . Open wound of knee, leg (except thigh), and ankle, complicated 6/44/0347  . Osteoarthritis of both knees 07/31/2014  . Osteoarthrosis, unspecified whether generalized or localized, unspecified site 02/05/2003  . PAF (paroxysmal atrial fibrillation) (H[facility])    coumadin  . Pain in joint, lower leg 08/04/2011  . Pruritic condition 01/24/2013  . Quadriceps weakness 07/31/2014  . Right bundle branch block 04/19/2006  . 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 Spinal stenosis in cervical region 12/15/2004  . SSS (sick sinus syndrome) ([place]) 04/08/2014  . Stroke ([place])   . Thyroid disease   . TIA (transient ischemic attack) 04/30/2013  . Type II or unspecified type diabetes mellitus without mention of complication, not stated as uncontrolled 11/24/2004  . Unspecified vitamin D deficiency 10/18/2006  . 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 Increased exposure of the right cornea.    Past Surgical History:  Procedure Laterality Date  . CORONARY  ANGIOPLASTY WITH STENT PLACEMENT  2008   stent to SVG to OM  . CORONARY ARTERY BYPASS GRAFT  1989  . EYE SURGERY    . HERNIA REPAIR    . INSERT / REPLACE / REMOVE PACEMAKER    . JOINT REPLACEMENT    . PACEMAKER INSERTION  2011    Allergies  Allergen Reactions  . Quinolones Other (See Comments)    Risk of rupture of AAA  . Caduet [Amlodipine-Atorvastatin] Other (See Comments)    Weakness   . Crestor [Rosuvastatin] Other (See Comments)    Muscle pain/weakness  . Lipitor [Atorvastatin Calcium] Other (See Comments)    Muscle pain/weakness  . Simvastatin Other (See Comments)    Muscle pain/weakness  . Lisinopril Other (See Comments)    Doesn't remember reaction    Outpatient Encounter Prescriptions as of 09/27/2016  Medication Sig  . allopurinol (ZYLOPRIM) 100 MG tablet Take 50 mg by mouth daily.   Marland Kitchen amoxicillin (AMOXIL) 500 MG capsule Take 500 mg by mouth. Take 4 caps prior to dental procedures  . apixaban (ELIQUIS) 2.5 MG TABS tablet Take 1 tablet (2.5 mg total) by mouth 2 (two) times daily.  Marland Kitchen aspirin EC 81 MG tablet  Take 81 mg by mouth daily.  . carboxymethylcellulose (REFRESH PLUS) 0.5 % SOLN Place 1 drop into both eyes 3 (three) times daily. May also do daily as needed  . Cholecalciferol 1000 units tablet Take 2,000 Units by mouth daily.  Marland Kitchen Dextran 70-Hypromellose 0.1-0.3 % SOLN Apply 1 drop to eye 2 (two) times daily as needed.  . furosemide (LASIX) 20 MG tablet Take 20 mg by mouth as needed. If patient has gained 3 lbs in 24 hours or 5 lbs in a week  . furosemide (LASIX) 40 MG tablet Take 40 mg by mouth daily.   Marland Kitchen ipratropium (ATROVENT) 0.06 % nasal spray Place 1 spray into both nostrils 3 (three) times daily as needed.   Marland Kitchen ipratropium-albuterol (DUONEB) 0.5-2.5 (3) MG/3ML SOLN Take 3 mLs by nebulization 4 (four) times daily as needed.  Marland Kitchen levothyroxine (SYNTHROID, LEVOTHROID) 125 MCG tablet Take 125 mcg by mouth daily before breakfast.  . LORazepam (ATIVAN) 0.5 MG tablet  Take 0.25 mg by mouth every 4 (four) hours as needed.   Marland Kitchen losartan (COZAAR) 25 MG tablet Take 1 tablet (25 mg total) by mouth daily.  . meclizine (ANTIVERT) 25 MG tablet Take 25 mg by mouth every 6 (six) hours as needed.   . Menthol, Topical Analgesic, (BIOFREEZE) 4 % GEL Apply topically. Apply to left knee four times daily as needed for pain  . metoprolol tartrate (LOPRESSOR) 25 MG tablet Take 1 tablet (25 mg total) by mouth 2 (two) times daily.  Marland Kitchen Neomycin-Bacitracin-Polymyxin (TRIPLE ANTIBIOTIC) [phone] OINT Apply 1 application topically daily as needed (apply to bilateral nostrils).  . nitroGLYCERIN (NITROSTAT) 0.4 MG SL tablet Place 0.4 mg under the tongue every 5 (five) minutes as needed for chest pain.   Marland Kitchen olopatadine (PATANOL) 0.1 % ophthalmic solution Place 1 drop into both eyes 2 (two) times daily as needed for allergies.  . OXYGEN Inhale 2 L into the lungs continuous.  . polyethylene glycol (MIRALAX / GLYCOLAX) packet Take 17 g by mouth daily as needed.   . RESOURCE BENEPROTEIN PO Take 15 mLs by mouth daily as needed.  Marland Kitchen spironolactone (ALDACTONE) 25 MG tablet Take 25 mg by mouth daily.  . tamsulosin (FLOMAX) 0.4 MG CAPS capsule Take one cap by mouth daily  . tobramycin-dexamethasone (TOBRADEX) ophthalmic ointment Place 1 application into both eyes daily as needed.   Marland Kitchen UNABLE TO FIND Med Name: Hydrocortisone cream with applicator 1%. Apply to hemorrhoids 2 times daily as needed   No facility-administered encounter medications on file as of 09/27/2016.     Review of Systems  Constitutional: Negative for appetite change.  HENT: Negative for congestion, rhinorrhea and sore throat.   Eyes:       Chronic right lower eyelid ectropion  Respiratory: Negative for cough and shortness of breath.   Cardiovascular: Negative for chest pain and palpitations.  Gastrointestinal: Negative for abdominal pain, nausea and vomiting.  Genitourinary: Negative for dysuria and frequency.       C/o  tender breasts R+L  Musculoskeletal: Positive for back pain.  Neurological: Negative for dizziness, numbness and headaches.  Psychiatric/Behavioral: Negative for behavioral problems.    Immunization History  Administered Date(s) Administered  . Influenza Whole 12/06/2011, 12/19/2012  . Influenza-Unspecified 01/09/2014, 11/25/2014, 12/23/2015  . PPD Test 05/03/2013  . Pneumococcal Polysaccharide-23 03/07/1986  . Td 04/20/2005   Pertinent  Health Maintenance Due  Topic Date Due  . OPHTHALMOLOGY EXAM  01/12/1932  . PNA vac Low Risk Adult (1 of 2 - PCV13) 01/12/1989  .  FOOT EXAM  07/31/2015  . HEMOGLOBIN A1C  06/30/2016  . INFLUENZA VACCINE  10/05/2016   Fall Risk  10/15/2015 06/11/2015 10/23/2014 07/07/2014 02/17/2014  Falls in the past year? No Yes Yes Yes Exclusion - non ambulatory  Number falls in past yr: - '1 1 1 ' -  Injury with Fall? - Yes - No -  Risk for fall due to : - - - History of fall(s);Impaired mobility;Impaired balance/gait -  Follow up - - - Falls evaluation completed;Falls prevention discussed -   Functional Status Survey:    Vitals:   09/27/16 1332  BP: 130/70  Pulse: 66  Resp: 20  Temp: 98.7 F (37.1 C)  SpO2: 95%  Weight: 156 lb 6.4 oz (70.9 kg)  Height: '5\' 8"'  (1.727 m)   Body mass index is 23.78 kg/m. Physical Exam  Constitutional: He is oriented to person, place, and time. He appears well-developed and well-nourished. No distress.  HENT:  Head: Normocephalic and atraumatic.  Mouth/Throat: Oropharynx is clear and moist.  Eyes: Pupils are equal, round, and reactive to light.  Right lower eyelid ectropion  Neck: Normal range of motion. Neck supple. No thyromegaly present.  Cardiovascular: Normal rate and regular rhythm.   Pulmonary/Chest: Effort normal. He has rales.  bibasilar  Abdominal: Soft. Bowel sounds are normal. There is no tenderness.  Genitourinary:  Genitourinary Comments: Enlarged tender breasts  Musculoskeletal: He exhibits no edema.    Can move all 4 extremities, has wheelchair and rolling walker. Normal plantar flexion and dorsiflexion. No swelling or redness noted to his legs or joints. Normal skin temperature.   Lymphadenopathy:    He has no cervical adenopathy.  Neurological: He is alert and oriented to person, place, and time.  Skin: Skin is warm and dry. He is not diaphoretic.  Psychiatric: He has a normal mood and affect.    Labs reviewed:  Recent Labs  01/26/16 05/05/16 06/15/16  NA 135* 135* 133*  K 4.6 4.8 4.7  BUN 26* 28* 32*  CREATININE 1.2 1.1 1.3    Recent Labs  01/26/16 05/05/16 06/15/16  AST '25 18 23  ' ALT '17 14 16  ' ALKPHOS 74 98 82    Recent Labs  01/26/16 05/05/16 06/15/16  WBC 6.8 6.4 7.3  HGB 16.1 16.2 16.2  HCT 47 48 48  PLT 171 195 217   Lab Results  Component Value Date   TSH 1.90 09/13/2016   Lab Results  Component Value Date   HGBA1C 6.3 12/31/2015   Lab Results  Component Value Date   CHOL 117 05/01/2013   HDL 30 (L) 05/01/2013   LDLCALC 71 05/01/2013   TRIG 79 05/01/2013   CHOLHDL 3.9 05/01/2013    Significant Diagnostic Results in last 30 days:  No results found.  Assessment/Plan Gynecomastia Enlarged, tender, symmetrical breasts, duration >6 months, TSH, Testosterone, CMP, Luteinizing hormone, Prolactin, Estradiol, HCG. May consider discontinue Spironolactone since it may contributory.   Ectropion of right eye Resume Tobradex ophthalmic oint nightly prescribed by his ophthalmology, f/u ophthalmology as needed  Epistaxis, recurrent On and off, he is able to stop his nose bleed with pressure, apply ABT oint in nostrils for moisturizer   Chronic systolic congestive heart failure ([place]) compensated clinically, continue Furosemide 18m qd, prn Furosemide. Spironolactone 261m f/u Cardiology  Hypothyroidism Continue Levothyroxine 12558m last TSH 1.9 09/14/16      Family/ staff Communication: AL  Labs/tests ordered:  TSH, Testosterone, CMP, Luteinizing  hormone, Prolactin, Estradiol, HCG

## 2016-09-28 DIAGNOSIS — N62 Hypertrophy of breast: Secondary | ICD-10-CM | POA: Insufficient documentation

## 2016-09-28 NOTE — Assessment & Plan Note (Signed)
compensated clinically, continue Furosemide 40mg  qd, prn Furosemide. Spironolactone 25mg , f/u Cardiology

## 2016-09-28 NOTE — Assessment & Plan Note (Signed)
Continue Levothyroxine 161mcg, last TSH 1.9 09/14/16

## 2016-09-28 NOTE — Assessment & Plan Note (Addendum)
Enlarged, tender, symmetrical breasts, duration >6 months, TSH, Testosterone, CMP, Luteinizing hormone, Prolactin, Estradiol, HCG. May consider discontinue Spironolactone since it may contributory.

## 2016-09-28 NOTE — Assessment & Plan Note (Signed)
Resume Tobradex ophthalmic oint nightly prescribed by his ophthalmology, f/u ophthalmology as needed

## 2016-09-28 NOTE — Assessment & Plan Note (Signed)
On and off, he is able to stop his nose bleed with pressure, apply ABT oint in nostrils for moisturizer

## 2016-09-29 DIAGNOSIS — N62 Hypertrophy of breast: Secondary | ICD-10-CM | POA: Diagnosis not present

## 2016-09-29 DIAGNOSIS — E349 Endocrine disorder, unspecified: Secondary | ICD-10-CM | POA: Diagnosis not present

## 2016-09-29 DIAGNOSIS — R7889 Finding of other specified substances, not normally found in blood: Secondary | ICD-10-CM | POA: Diagnosis not present

## 2016-09-29 DIAGNOSIS — E221 Hyperprolactinemia: Secondary | ICD-10-CM | POA: Diagnosis not present

## 2016-09-29 LAB — BASIC METABOLIC PANEL
BUN: 23 — AB (ref 4–21)
BUN: 23 — AB (ref 4–21)
CREATININE: 1.1 (ref <=1.3)
Creatinine: 1.1 (ref <=1.3)
GLUCOSE: 92
GLUCOSE: 92
POTASSIUM: 4.6 (ref 3.4–5.3)
Potassium: 4.6 (ref 3.4–5.3)
SODIUM: 133 — AB (ref 137–147)
SODIUM: 133 — AB (ref 137–147)

## 2016-09-29 LAB — HEPATIC FUNCTION PANEL
ALT: 11 (ref 10–40)
AST: 17 (ref 14–40)
Alkaline Phosphatase: 78 (ref 25–125)
BILIRUBIN, TOTAL: 0.9

## 2016-09-29 LAB — TSH: TSH: 1.56 (ref <=5.90)

## 2016-09-30 ENCOUNTER — Other Ambulatory Visit: Payer: Self-pay | Admitting: *Deleted

## 2016-10-04 ENCOUNTER — Encounter: Payer: Self-pay | Admitting: Nurse Practitioner

## 2016-10-04 ENCOUNTER — Non-Acute Institutional Stay: Payer: Medicare Other | Admitting: Nurse Practitioner

## 2016-10-04 DIAGNOSIS — N62 Hypertrophy of breast: Secondary | ICD-10-CM

## 2016-10-04 DIAGNOSIS — R040 Epistaxis: Secondary | ICD-10-CM

## 2016-10-04 DIAGNOSIS — I5022 Chronic systolic (congestive) heart failure: Secondary | ICD-10-CM | POA: Diagnosis not present

## 2016-10-04 DIAGNOSIS — I480 Paroxysmal atrial fibrillation: Secondary | ICD-10-CM | POA: Diagnosis not present

## 2016-10-04 NOTE — Assessment & Plan Note (Signed)
,   unremarkable TSH, Testosterone, CMP, Luteinizing hormone, Prolactin, Estradiol, HCG. Spironolactone may be the culprit. Dc Spironolactone, observe the patient.

## 2016-10-04 NOTE — Assessment & Plan Note (Signed)
taking Eliquis for Hx of CVA and Afib. F/u Cardiology for continuation of both in setting of frequent nose bleed.

## 2016-10-04 NOTE — Progress Notes (Signed)
Location:  [facility] Room Number: [place] of Service:  [phone]) Provider:  Jontae Sonier, [place]  NP  Blanchie Serve, MD  Patient Care Team: Blanchie Serve, MD as PCP - General (Internal Medicine) [place], Nadean Corwin, MD as Consulting Physician (Cardiology) Ardis Hughs, MD as Attending Physician (Urology) Rozelia Catapano X, NP as Nurse Practitioner (Internal Medicine)  Extended Emergency Contact Information Primary Emergency Contact: [name] of [place] Phone: [phone] Relation: Daughter Secondary Emergency Contact: Eyvonne Mechanic [place] of [place] Phone: [phone] Mobile Phone: [phone] Relation: Daughter  Code Status:  Full Code Goals of care: Advanced Directive information Advanced Directives 10/04/2016  Does Patient Have a Medical Advance Directive? Yes  Type of Paramedic of [place];Living will;Out of facility DNR (pink MOST or yellow form)  Does patient want to make changes to medical advance directive? No - Patient declined  Copy of [place] in Chart? Yes  Pre-existing out of facility DNR order (yellow form or pink MOST form) Yellow form placed in chart (order not valid for inpatient use)     Chief Complaint  Patient presents with  . Acute Visit    nose bleed x 3    HPI:  Pt is a 83 y.o. male seen today for an acute visit for the right nostril bleed x 3, no injury or trauma, recurrent, stopped with pressure,  taking Eliquis for Hx of CVA and Afib. Prn Afrin and Bactroban oint available to him.   Hx of CHF, compensated on Furosemide and Spironolactone, recently c/o tender and enlarged breasts, unremarkable TSH, Testosterone, CMP, Luteinizing hormone, Prolactin, Estradiol, HCG. Spironolactone may be the culprit.   Past Medical History:  Diagnosis Date  . Abnormal PFTs 10/30/2012   Followed in Pulmonary clinic/ [facility]  - PFT's 10/22/12 VC  55% no obst, DLCO 50%  - PFT's 12/20/2012 VC 83% and no obst, dLCO 55%  -11/08/2012  Walked RA x 3 laps @ 185 ft each stopped due to  End of study, not desat -11/08/12 esr 10    . Abnormality of gait 04/18/2013  . Acute on chronic systolic congestive heart failure, NYHA class 2 ([place]) 03/16/2006   BNP 376.9 05/28/13   . Adjustment disorder with mixed anxiety and depressed mood 04/18/2013   Post CVA depression and anxiety   . Allergy   . Anxiety   . Aortic aneurysm of unspecified site without mention of rupture 10/27/2011  . Arthritis   . Atherosclerosis of renal artery ([place]) 10/18/2006  . Atrial fibrillation ([place]) 03/04/2011    He was on coumadin until his stroke last December and was switched to apixaban 2.'5mg'$  daily which he has been taking faithfully without reported side effects.    . Balance disorder 07/31/2014  . Basal cell carcinoma of skin of other and unspecified parts of face 12/24/2009  . Blood transfusion without reported diagnosis   . Cardiac pacemaker in situ- MDT 10/11 03/18/2010   Medtronic revo implant in October 2011. Severe sinus bradycardia in the 30s, but not truly pacemaker dependent   . Cataract   . CKD stage 2 due to type 2 diabetes mellitus ([place]) 07/03/2014  . Coronary artery disease   . CVA - Rt brain stroke 12/14 02/18/2013   02/19/13 angiography CT head:  Diffuse atherosclerotic irregularity and plaque formation of the distal right common carotid artery and proximal internal carotid artery without significant stenosis. Plaque ulceration is present  and is a source of emboli. A small right thalamic CVA    . DM (diabetes mellitus), type 2 with renal complications ([place]) 07/22/6158  . Dyslipidemia 11/03/2004  . Fall 03/04/2011  . Fatigue 04/18/2013  . Gout, unspecified 11/03/2004  . Heart murmur   . History of abdominal aortic aneurysm   . History of Doppler ultrasound 05/22/2012   LEAs; R anterior tibial artery appeared occluded; L posterior  tibial shows short segment of occlusive ds  . History of Doppler ultrasound 05/22/2012   Abdominal Aortic Doppler; slight increase in fusiform aneurysm   . History of echocardiogram 12/2008   EF >55%; mild concentric LVH; mild MR; mild-mod TR; mild AV regurg;   . History of nuclear stress test 12/2010   lexiscan; low risk; compared to prior study, perfusion improved  . HTN (hypertension) 03/04/2011  . Hyperlipidemia   . Hypertension   . Hypertrophy of prostate with urinary obstruction and other lower urinary tract symptoms (LUTS) 04/28/2004  . Hyponatremia 03/04/2011  . Hypothyroidism 03/04/2011  . Impotence of organic origin 10/03/2000  . Internal hemorrhoids without mention of complication 73/71/0626  . Long term (current) use of anticoagulants 03/17/2011  . Major depressive disorder, single episode, unspecified 03/05/2009  . Memory change 01/24/2013  . Muscle weakness (generalized) 03/10/2011  . Nocturia 10/27/2011  . Obstructive sleep apnea (adult) (pediatric) 07/23/2008  . Open wound of knee, leg (except thigh), and ankle, complicated 9/48/5462  . Osteoarthritis of both knees 07/31/2014  . Osteoarthrosis, unspecified whether generalized or localized, unspecified site 02/05/2003  . PAF (paroxysmal atrial fibrillation) (HCC)    coumadin  . Pain in joint, lower leg 08/04/2011  . Pruritic condition 01/24/2013  . Quadriceps weakness 07/31/2014  . Right bundle branch block 04/19/2006  . 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 Spinal stenosis in cervical region 12/15/2004  . SSS (sick sinus syndrome) ([place]) 04/08/2014  . Stroke ([place])   . Thyroid disease   . TIA (transient ischemic attack) 04/30/2013  . Type II or unspecified type diabetes mellitus without mention of complication, not stated as uncontrolled 11/24/2004  . Unspecified vitamin D deficiency 10/18/2006  . 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 Increased exposure of  the right cornea.    Past Surgical History:  Procedure Laterality Date  . CORONARY ANGIOPLASTY WITH STENT PLACEMENT  2008   stent to SVG to OM  . CORONARY ARTERY BYPASS GRAFT  1989  . EYE SURGERY    . HERNIA REPAIR    . INSERT / REPLACE / REMOVE PACEMAKER    . JOINT REPLACEMENT    . PACEMAKER INSERTION  2011    Allergies  Allergen Reactions  . Quinolones Other (See Comments)    Risk of rupture of AAA  . Caduet [Amlodipine-Atorvastatin] Other (See Comments)    Weakness   . Crestor [Rosuvastatin] Other (See Comments)    Muscle pain/weakness  . Lipitor [Atorvastatin Calcium] Other (See Comments)    Muscle pain/weakness  . Simvastatin Other (See Comments)    Muscle pain/weakness  . Lisinopril Other (See Comments)    Doesn't remember reaction    Outpatient Encounter Prescriptions as of 10/04/2016  Medication Sig  . allopurinol (ZYLOPRIM) 100 MG tablet Take 50 mg by mouth daily.   Marland Kitchen amoxicillin (AMOXIL) 500 MG capsule Take 500 mg by mouth. Take 4 caps prior to dental procedures  . apixaban (ELIQUIS) 2.5 MG TABS tablet Take 1 tablet (2.5  mg total) by mouth 2 (two) times daily.  Marland Kitchen aspirin EC 81 MG tablet Take 81 mg by mouth daily.  . carboxymethylcellulose (REFRESH PLUS) 0.5 % SOLN Place 1 drop into both eyes 3 (three) times daily. May also do daily as needed  . Cholecalciferol 1000 units tablet Take 2,000 Units by mouth daily.  Marland Kitchen Dextran 70-Hypromellose 0.1-0.3 % SOLN Apply 1 drop to eye 2 (two) times daily as needed.  . furosemide (LASIX) 20 MG tablet Take 20 mg by mouth as needed. If patient has gained 3 lbs in 24 hours or 5 lbs in a week  . furosemide (LASIX) 40 MG tablet Take 40 mg by mouth daily.   Marland Kitchen ipratropium (ATROVENT) 0.06 % nasal spray Place 1 spray into both nostrils 3 (three) times daily as needed.   Marland Kitchen ipratropium-albuterol (DUONEB) 0.5-2.5 (3) MG/3ML SOLN Take 3 mLs by nebulization 4 (four) times daily as needed.  Marland Kitchen levothyroxine (SYNTHROID, LEVOTHROID) 125 MCG  tablet Take 125 mcg by mouth daily before breakfast.  . LORazepam (ATIVAN) 0.5 MG tablet Take 0.25 mg by mouth every 4 (four) hours as needed.   Marland Kitchen losartan (COZAAR) 25 MG tablet Take 1 tablet (25 mg total) by mouth daily.  . meclizine (ANTIVERT) 25 MG tablet Take 25 mg by mouth every 6 (six) hours as needed.   . Menthol, Topical Analgesic, (BIOFREEZE) 4 % GEL Apply topically. Apply to left knee four times daily as needed for pain  . metoprolol tartrate (LOPRESSOR) 25 MG tablet Take 1 tablet (25 mg total) by mouth 2 (two) times daily.  Marland Kitchen Neomycin-Bacitracin-Polymyxin (TRIPLE ANTIBIOTIC) [phone] OINT Apply 1 application topically daily as needed (apply to bilateral nostrils).  . nitroGLYCERIN (NITROSTAT) 0.4 MG SL tablet Place 0.4 mg under the tongue every 5 (five) minutes as needed for chest pain.   Marland Kitchen olopatadine (PATANOL) 0.1 % ophthalmic solution Place 1 drop into both eyes 2 (two) times daily as needed for allergies.  . OXYGEN Inhale 2 L into the lungs continuous.  . polyethylene glycol (MIRALAX / GLYCOLAX) packet Take 17 g by mouth daily as needed.   . RESOURCE BENEPROTEIN PO Take 15 mLs by mouth daily as needed.  Marland Kitchen spironolactone (ALDACTONE) 25 MG tablet Take 25 mg by mouth daily.  . tamsulosin (FLOMAX) 0.4 MG CAPS capsule Take one cap by mouth daily  . tobramycin-dexamethasone (TOBRADEX) ophthalmic ointment Place 1 application into both eyes daily as needed.   . triamcinolone cream (KENALOG) 0.1 % Apply 1 application topically daily.  Marland Kitchen UNABLE TO FIND Med Name: Hydrocortisone cream with applicator 1%. Apply to hemorrhoids 2 times daily as needed   No facility-administered encounter medications on file as of 10/04/2016.     Review of Systems  Constitutional: Negative for appetite change.  HENT: Positive for nosebleeds. Negative for congestion, rhinorrhea and sore throat.        The right nostril.   Eyes:       Chronic right lower eyelid ectropion  Respiratory: Negative for cough  and shortness of breath.   Cardiovascular: Negative for chest pain.  Gastrointestinal: Negative for abdominal pain, nausea and vomiting.  Genitourinary: Negative for dysuria and frequency.       C/o tender breasts R+L  Musculoskeletal: Positive for back pain.  Neurological: Negative for dizziness, numbness and headaches.  Psychiatric/Behavioral: Negative for behavioral problems and confusion.    Immunization History  Administered Date(s) Administered  . Influenza Whole 12/06/2011, 12/19/2012  . Influenza-Unspecified 01/09/2014, 11/25/2014, 12/23/2015  . PPD Test  05/03/2013  . Pneumococcal Polysaccharide-23 03/07/1986  . Td 04/20/2005   Pertinent  Health Maintenance Due  Topic Date Due  . OPHTHALMOLOGY EXAM  01/12/1932  . PNA vac Low Risk Adult (1 of 2 - PCV13) 01/12/1989  . FOOT EXAM  07/31/2015  . HEMOGLOBIN A1C  06/30/2016  . INFLUENZA VACCINE  10/05/2016   Fall Risk  10/15/2015 06/11/2015 10/23/2014 07/07/2014 02/17/2014  Falls in the past year? No Yes Yes Yes Exclusion - non ambulatory  Number falls in past yr: - '1 1 1 '$ -  Injury with Fall? - Yes - No -  Risk for fall due to : - - - History of fall(s);Impaired mobility;Impaired balance/gait -  Follow up - - - Falls evaluation completed;Falls prevention discussed -   Functional Status Survey:    Vitals:   10/04/16 1348  BP: 118/60  Pulse: 96  Resp: 18  Temp: 97.6 F (36.4 C)  Weight: 158 lb (71.7 kg)  Height: '5\' 8"'$  (1.727 m)   Body mass index is 24.02 kg/m. Physical Exam  Constitutional: He is oriented to person, place, and time. He appears well-developed and well-nourished. No distress.  HENT:  Head: Normocephalic and atraumatic.  Mouth/Throat: Oropharynx is clear and moist.  Erythematous area right septum.   Eyes: Pupils are equal, round, and reactive to light.  Right lower eyelid ectropion  Neck: Normal range of motion. Neck supple. No thyromegaly present.  Cardiovascular: Normal rate and regular rhythm.     Pulmonary/Chest: Effort normal. He has rales.  bibasilar  Abdominal: Soft. Bowel sounds are normal. There is no tenderness.  Genitourinary:  Genitourinary Comments: Enlarged tender breasts  Musculoskeletal: He exhibits no edema.  Can move all 4 extremities, has wheelchair and rolling walker. No apparent edema BLE  Lymphadenopathy:    He has no cervical adenopathy.  Neurological: He is alert and oriented to person, place, and time.  Skin: Skin is warm and dry. He is not diaphoretic.  Psychiatric: He has a normal mood and affect.    Labs reviewed:  Recent Labs  05/05/16 06/15/16 09/29/16  NA 135* 133* 133*  K 4.8 4.7 4.6  BUN 28* 32* 23*  CREATININE 1.1 1.3 1.1    Recent Labs  05/05/16 06/15/16 09/29/16  AST '18 23 17  '$ ALT '14 16 11  '$ ALKPHOS 98 82 78    Recent Labs  01/26/16 05/05/16 06/15/16  WBC 6.8 6.4 7.3  HGB 16.1 16.2 16.2  HCT 47 48 48  PLT 171 195 217   Lab Results  Component Value Date   TSH 1.56 09/29/2016   Lab Results  Component Value Date   HGBA1C 6.3 12/31/2015   Lab Results  Component Value Date   CHOL 117 05/01/2013   HDL 30 (L) 05/01/2013   LDLCALC 71 05/01/2013   TRIG 79 05/01/2013   CHOLHDL 3.9 05/01/2013    Significant Diagnostic Results in last 30 days:  No results found.  Assessment/Plan Epistaxis, recurrent the right nostril bleed x 3, no injury or trauma, recurrent, stopped with pressure,  taking Eliquis for Hx of CVA and Afib. Prn Afrin and Bactroban oint available to him.  Will f/u Cardiology for evaluation of continuation of Eliquis and ASA  Chronic systolic congestive heart failure (HCC) Hx of CHF, compensated on Furosemide and Spironolactone, recently c/o tender and enlarged breasts, unremarkable TSH, Testosterone, CMP, Luteinizing hormone, Prolactin, Estradiol, HCG. Spironolactone may be the culprit.  Edema is not apparent in BLE, dry rales bibasilar, no cough or sputum production  Dc Spironolactone, observe for developing  CHF decompensation. Update BMP BNP one week.   Atrial fibrillation ([place]) taking Eliquis for Hx of CVA and Afib. F/u Cardiology for continuation of both in setting of frequent nose bleed.   Gynecomastia , unremarkable TSH, Testosterone, CMP, Luteinizing hormone, Prolactin, Estradiol, HCG. Spironolactone may be the culprit. Dc Spironolactone, observe the patient.      Family/ staff Communication: AL  Labs/tests ordered:  BMP BNP one week.

## 2016-10-04 NOTE — Assessment & Plan Note (Signed)
the right nostril bleed x 3, no injury or trauma, recurrent, stopped with pressure,  taking Eliquis for Hx of CVA and Afib. Prn Afrin and Bactroban oint available to him.  Will f/u Cardiology for evaluation of continuation of Eliquis and ASA

## 2016-10-04 NOTE — Assessment & Plan Note (Signed)
Hx of CHF, compensated on Furosemide and Spironolactone, recently c/o tender and enlarged breasts, unremarkable TSH, Testosterone, CMP, Luteinizing hormone, Prolactin, Estradiol, HCG. Spironolactone may be the culprit.  Edema is not apparent in BLE, dry rales bibasilar, no cough or sputum production Dc Spironolactone, observe for developing CHF decompensation. Update BMP BNP one week.

## 2016-10-11 ENCOUNTER — Encounter: Payer: Self-pay | Admitting: Internal Medicine

## 2016-10-11 ENCOUNTER — Non-Acute Institutional Stay: Payer: Medicare Other | Admitting: Internal Medicine

## 2016-10-11 DIAGNOSIS — I480 Paroxysmal atrial fibrillation: Secondary | ICD-10-CM | POA: Diagnosis not present

## 2016-10-11 DIAGNOSIS — N401 Enlarged prostate with lower urinary tract symptoms: Secondary | ICD-10-CM | POA: Diagnosis not present

## 2016-10-11 DIAGNOSIS — I10 Essential (primary) hypertension: Secondary | ICD-10-CM | POA: Diagnosis not present

## 2016-10-11 DIAGNOSIS — I5021 Acute systolic (congestive) heart failure: Secondary | ICD-10-CM | POA: Diagnosis not present

## 2016-10-11 DIAGNOSIS — R3911 Hesitancy of micturition: Secondary | ICD-10-CM

## 2016-10-11 DIAGNOSIS — M1A9XX0 Chronic gout, unspecified, without tophus (tophi): Secondary | ICD-10-CM

## 2016-10-11 DIAGNOSIS — Z8673 Personal history of transient ischemic attack (TIA), and cerebral infarction without residual deficits: Secondary | ICD-10-CM | POA: Diagnosis not present

## 2016-10-11 DIAGNOSIS — I5022 Chronic systolic (congestive) heart failure: Secondary | ICD-10-CM | POA: Diagnosis not present

## 2016-10-11 DIAGNOSIS — N62 Hypertrophy of breast: Secondary | ICD-10-CM

## 2016-10-11 DIAGNOSIS — N289 Disorder of kidney and ureter, unspecified: Secondary | ICD-10-CM | POA: Diagnosis not present

## 2016-10-11 DIAGNOSIS — Z79899 Other long term (current) drug therapy: Secondary | ICD-10-CM | POA: Diagnosis not present

## 2016-10-11 LAB — BASIC METABOLIC PANEL
BUN: 22 — AB (ref 4–21)
Creatinine: 1 (ref <=1.3)
GLUCOSE: 101
Potassium: 4.5 (ref 3.4–5.3)
SODIUM: 134 — AB (ref 137–147)

## 2016-10-11 NOTE — Progress Notes (Signed)
Location:  [facility] Room Number: 902-A Place of Service:  ALF [phone]) Provider:  Blanchie Serve MD  Blanchie Serve, MD  Patient Care Team: Blanchie Serve, MD as PCP - General (Internal Medicine) [place], Nadean Corwin, MD as Consulting Physician (Cardiology) Ardis Hughs, MD as Attending Physician (Urology) Mast, Man X, NP as Nurse Practitioner (Internal Medicine)  Extended Emergency Contact Information Primary Emergency Contact: [name] of [place] Phone: [phone] Relation: Daughter Secondary Emergency Contact: Eyvonne Mechanic [place] of [place] Phone: [phone] Mobile Phone: [phone] Relation: Daughter  Code Status:  Full Code Goals of care: Advanced Directive information Advanced Directives 10/04/2016  Does Patient Have a Medical Advance Directive? Yes  Type of Paramedic of [place];Living will;Out of facility DNR (pink MOST or yellow form)  Does patient want to make changes to medical advance directive? No - Patient declined  Copy of [place] in Chart? Yes  Pre-existing out of facility DNR order (yellow form or pink MOST form) Yellow form placed in chart (order not valid for inpatient use)     Chief Complaint  Patient presents with  . Medical Management of Chronic Issues    Routine Visit     HPI:  Pt is a 83 y.o. male seen today for medical management of chronic diseases. He complaints of tenderness to his breasts and has noticed enlargement for few months now. He denies any drainage. No other complaints reported. Energy level is fair. He gets around the facility in motorized wheelchair and in his room with his walker. He is complaint with his medications.    Past Medical History:  Diagnosis Date  . Abnormal PFTs 10/30/2012   Followed in Pulmonary clinic/ [facility]/ [place]  - PFT's 10/22/12 VC  55% no obst, DLCO 50%  -  PFT's 12/20/2012 VC 83% and no obst, dLCO 55%  -11/08/2012  Walked RA x 3 laps @ 185 ft each stopped due to  End of study, not desat -11/08/12 esr 10    . Abnormality of gait 04/18/2013  . Acute on chronic systolic congestive heart failure, NYHA class 2 ([place]) 03/16/2006   BNP 376.9 05/28/13   . Adjustment disorder with mixed anxiety and depressed mood 04/18/2013   Post CVA depression and anxiety   . Allergy   . Anxiety   . Aortic aneurysm of unspecified site without mention of rupture 10/27/2011  . Arthritis   . Atherosclerosis of renal artery ([place]) 10/18/2006  . Atrial fibrillation ([place]) 03/04/2011    He was on coumadin until his stroke last December and was switched to apixaban 2.40m daily which he has been taking faithfully without reported side effects.    . Balance disorder 07/31/2014  . Basal cell carcinoma of skin of other and unspecified parts of face 12/24/2009  . Blood transfusion without reported diagnosis   . Cardiac pacemaker in situ- MDT 10/11 03/18/2010   Medtronic revo implant in October 2011. Severe sinus bradycardia in the 30s, but not truly pacemaker dependent   . Cataract   . CKD stage 2 due to type 2 diabetes mellitus ([place] 07/03/2014  . Coronary artery disease   . CVA - Rt brain stroke 12/14 02/18/2013   02/19/13 angiography CT head:  Diffuse atherosclerotic irregularity and plaque formation of the distal right common carotid artery and proximal internal carotid artery without significant stenosis. Plaque ulceration is present and is a source of emboli. A small right thalamic  CVA    . DM (diabetes mellitus), type 2 with renal complications ([place]) 4/82/5003  . Dyslipidemia 11/03/2004  . Fall 03/04/2011  . Fatigue 04/18/2013  . Gout, unspecified 11/03/2004  . Heart murmur   . History of abdominal aortic aneurysm   . History of Doppler ultrasound 05/22/2012   LEAs; R anterior tibial artery appeared occluded; L posterior tibial shows short segment of occlusive ds  . History of Doppler  ultrasound 05/22/2012   Abdominal Aortic Doppler; slight increase in fusiform aneurysm   . History of echocardiogram 12/2008   EF >55%; mild concentric LVH; mild MR; mild-mod TR; mild AV regurg;   . History of nuclear stress test 12/2010   lexiscan; low risk; compared to prior study, perfusion improved  . HTN (hypertension) 03/04/2011  . Hyperlipidemia   . Hypertension   . Hypertrophy of prostate with urinary obstruction and other lower urinary tract symptoms (LUTS) 04/28/2004  . Hyponatremia 03/04/2011  . Hypothyroidism 03/04/2011  . Impotence of organic origin 10/03/2000  . Internal hemorrhoids without mention of complication 70/48/8891  . Long term (current) use of anticoagulants 03/17/2011  . Major depressive disorder, single episode, unspecified 03/05/2009  . Memory change 01/24/2013  . Muscle weakness (generalized) 03/10/2011  . Nocturia 10/27/2011  . Obstructive sleep apnea (adult) (pediatric) 07/23/2008  . Open wound of knee, leg (except thigh), and ankle, complicated 6/94/5038  . Osteoarthritis of both knees 07/31/2014  . Osteoarthrosis, unspecified whether generalized or localized, unspecified site 02/05/2003  . PAF (paroxysmal atrial fibrillation) (HCC)    coumadin  . Pain in joint, lower leg 08/04/2011  . Pruritic condition 01/24/2013  . Quadriceps weakness 07/31/2014  . Right bundle branch block 04/19/2006  . 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 Spinal stenosis in cervical region 12/15/2004  . SSS (sick sinus syndrome) ([place]) 04/08/2014  . Stroke ([place])   . Thyroid disease   . TIA (transient ischemic attack) 04/30/2013  . Type II or unspecified type diabetes mellitus without mention of complication, not stated as uncontrolled 11/24/2004  . Unspecified vitamin D deficiency 10/18/2006  . 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 Increased exposure of the right cornea.    Past Surgical History:  Procedure  Laterality Date  . CORONARY ANGIOPLASTY WITH STENT PLACEMENT  2008   stent to SVG to OM  . CORONARY ARTERY BYPASS GRAFT  1989  . EYE SURGERY    . HERNIA REPAIR    . INSERT / REPLACE / REMOVE PACEMAKER    . JOINT REPLACEMENT    . PACEMAKER INSERTION  2011    Allergies  Allergen Reactions  . Quinolones Other (See Comments)    Risk of rupture of AAA  . Caduet [Amlodipine-Atorvastatin] Other (See Comments)    Weakness   . Crestor [Rosuvastatin] Other (See Comments)    Muscle pain/weakness  . Lipitor [Atorvastatin Calcium] Other (See Comments)    Muscle pain/weakness  . Simvastatin Other (See Comments)    Muscle pain/weakness  . Lisinopril Other (See Comments)    Doesn't remember reaction    Outpatient Encounter Prescriptions as of 10/11/2016  Medication Sig  . allopurinol (ZYLOPRIM) 100 MG tablet Take 50 mg by mouth daily.   Marland Kitchen amoxicillin (AMOXIL) 500 MG capsule Take 500 mg by mouth. Take 4 caps prior to dental procedures  . apixaban (ELIQUIS) 2.5 MG TABS tablet Take 1 tablet (2.5 mg total) by mouth 2 (two) times daily.  Marland Kitchen  aspirin EC 81 MG tablet Take 81 mg by mouth daily.  . carboxymethylcellulose (REFRESH PLUS) 0.5 % SOLN Place 1 drop into both eyes 3 (three) times daily. May also do daily as needed  . Cholecalciferol 1000 units tablet Take 2,000 Units by mouth daily.  Marland Kitchen Dextran 70-Hypromellose 0.1-0.3 % SOLN Apply 1 drop to eye 2 (two) times daily as needed.  . furosemide (LASIX) 20 MG tablet Take 20 mg by mouth as needed. If patient has gained 3 lbs in 24 hours or 5 lbs in a week  . furosemide (LASIX) 40 MG tablet Take 40 mg by mouth daily.   Marland Kitchen ipratropium (ATROVENT) 0.06 % nasal spray Place 1 spray into both nostrils 3 (three) times daily as needed.   Marland Kitchen ipratropium-albuterol (DUONEB) 0.5-2.5 (3) MG/3ML SOLN Take 3 mLs by nebulization 4 (four) times daily as needed.  Marland Kitchen levothyroxine (SYNTHROID, LEVOTHROID) 125 MCG tablet Take 125 mcg by mouth daily before breakfast.  .  LORazepam (ATIVAN) 0.5 MG tablet Take 0.25 mg by mouth every 4 (four) hours as needed.   Marland Kitchen losartan (COZAAR) 25 MG tablet Take 1 tablet (25 mg total) by mouth daily.  . meclizine (ANTIVERT) 25 MG tablet Take 25 mg by mouth every 6 (six) hours as needed.   . Menthol, Topical Analgesic, (BIOFREEZE) 4 % GEL Apply topically. Apply to left knee four times daily as needed for pain  . metoprolol tartrate (LOPRESSOR) 25 MG tablet Take 1 tablet (25 mg total) by mouth 2 (two) times daily.  . mupirocin nasal ointment (BACTROBAN) 2 % Place 1 application into the nose 2 (two) times daily as needed.  Marland Kitchen Neomycin-Bacitracin-Polymyxin (TRIPLE ANTIBIOTIC) [phone] OINT Apply 1 application topically daily as needed (apply to bilateral nostrils).  . nitroGLYCERIN (NITROSTAT) 0.4 MG SL tablet Place 0.4 mg under the tongue every 5 (five) minutes as needed for chest pain.   Marland Kitchen olopatadine (PATANOL) 0.1 % ophthalmic solution Place 1 drop into both eyes 2 (two) times daily as needed for allergies.  . OXYGEN Inhale 2 L into the lungs continuous.  . polyethylene glycol (MIRALAX / GLYCOLAX) packet Take 17 g by mouth daily as needed.   . RESOURCE BENEPROTEIN PO Take 15 mLs by mouth daily as needed.  . tamsulosin (FLOMAX) 0.4 MG CAPS capsule Take one cap by mouth daily  . tobramycin-dexamethasone (TOBRADEX) ophthalmic ointment Place 1 application into both eyes daily as needed. Can also use daily at bedtime  . triamcinolone cream (KENALOG) 0.1 % Apply 1 application topically daily.  Marland Kitchen UNABLE TO FIND Med Name: Hydrocortisone cream with applicator 1%. Apply to hemorrhoids 2 times daily as needed  . [DISCONTINUED] spironolactone (ALDACTONE) 25 MG tablet Take 25 mg by mouth daily.   No facility-administered encounter medications on file as of 10/11/2016.     Review of Systems  Constitutional: Negative for appetite change, chills, diaphoresis and fever.  HENT: Positive for rhinorrhea and trouble swallowing. Negative for  congestion, ear discharge, ear pain, mouth sores, postnasal drip and sinus pressure.        Occasional trouble with swallowing, taking small bites and sips in between helps  Respiratory: Positive for shortness of breath. Negative for cough and wheezing.        Has SOB with exertion  Cardiovascular: Positive for leg swelling. Negative for chest pain and palpitations.  Gastrointestinal: Negative for abdominal pain, blood in stool, constipation, diarrhea, nausea and vomiting.       Had bowel movement this am  Genitourinary: Negative for  dysuria.  Musculoskeletal: Positive for arthralgias and gait problem. Negative for back pain.       Left knee pain   Skin: Negative for rash and wound.  Neurological: Negative for dizziness, tremors, seizures, syncope and numbness.  Psychiatric/Behavioral: Negative for behavioral problems and confusion.    Immunization History  Administered Date(s) Administered  . Influenza Whole 12/06/2011, 12/19/2012  . Influenza-Unspecified 01/09/2014, 11/25/2014, 12/23/2015  . PPD Test 05/03/2013  . Pneumococcal Polysaccharide-23 03/07/1986  . Td 04/20/2005   Pertinent  Health Maintenance Due  Topic Date Due  . OPHTHALMOLOGY EXAM  01/12/1932  . PNA vac Low Risk Adult (1 of 2 - PCV13) 01/12/1989  . FOOT EXAM  07/31/2015  . HEMOGLOBIN A1C  06/30/2016  . INFLUENZA VACCINE  10/05/2016   Fall Risk  10/15/2015 06/11/2015 10/23/2014 07/07/2014 02/17/2014  Falls in the past year? No Yes Yes Yes Exclusion - non ambulatory  Number falls in past yr: - _0 -  Injury with Fall? - Yes - No -  Risk for fall due to : - - - History of fall(s);Impaired mobility;Impaired balance/gait -  Follow up - - - Falls evaluation completed;Falls prevention discussed -   Functional Status Survey:    Vitals:   10/11/16 1526  BP: 126/80  Pulse: 62  Resp: 18  Temp: (!) 97.4 F (36.3 C)  TempSrc: Oral  SpO2: 97%  Weight: 156 lb 12.8 oz (71.1 kg)  Height: _1  (1.727 m)   Body mass  index is 23.84 kg/m.   Wt Readings from Last 3 Encounters:  10/11/16 156 lb 12.8 oz (71.1 kg)  10/04/16 158 lb (71.7 kg)  09/27/16 156 lb 6.4 oz (70.9 kg)   Physical Exam  Constitutional: He is oriented to person, place, and time. He appears well-developed and well-nourished. No distress.  HENT:  Head: Normocephalic and atraumatic.  Eyes: Pupils are equal, round, and reactive to light. Conjunctivae and EOM are normal.  Neck: Normal range of motion. Neck supple. No JVD present. No thyromegaly present.  Cardiovascular: Normal rate and regular rhythm.   Murmur heard. Pulmonary/Chest: Effort normal and breath sounds normal. No respiratory distress. He has no wheezes. He has no rales.  Enlarged breast tissue on both sides, no drainage, normal areola, left chest wall pacemaker  Abdominal: Soft. Bowel sounds are normal. He exhibits no distension. There is no tenderness. There is no rebound and no guarding.  Musculoskeletal: He exhibits edema.  Trace leg edema, can move all 4 extremities, gets around with walker and uses motorized wheelchair for longer distance  Lymphadenopathy:    He has no cervical adenopathy.  Neurological: He is alert and oriented to person, place, and time.  Skin: Skin is warm and dry. He is not diaphoretic.  Psychiatric: He has a normal mood and affect. His behavior is normal.    Labs reviewed:  Recent Labs  05/05/16 06/15/16 09/29/16  NA 135* 133* 133*  K 4.8 4.7 4.6  BUN 28* 32* 23*  CREATININE 1.1 1.3 1.1    Recent Labs  05/05/16 06/15/16 09/29/16  AST _2 ALT _3 ALKPHOS 98 82 78    Recent Labs  01/26/16 05/05/16 06/15/16  WBC 6.8 6.4 7.3  HGB 16.1 16.2 16.2  HCT 47 48 48  PLT 171 195 217   Lab Results  Component Value Date   TSH 1.56 09/29/2016   Lab Results  Component Value Date   HGBA1C 6.3 12/31/2015   Lab Results  Component Value Date   CHOL 117 05/01/2013   HDL 30 (L) 05/01/2013   LDLCALC 71 05/01/2013   TRIG 79  05/01/2013   CHOLHDL 3.9 05/01/2013    Significant Diagnostic Results in last 30 days:  No results found.  Assessment/Plan  Chronic systolic chf Appears euvolemic. Continue lasix 40 mg daily with additional 20 mg daily as needed. Continue losartan 25 mg daily and metoprolol tartrate 25 mg bid. Off spironolactone with concern for gynaecomastia. Breathing stable. monitor  gynaecomastia No labs available for review but per NP note, normal testosterone level. Taken off spironolactone. Monitor clinically. If no improvement or worsens, obtain breast ultrasound to assess further.   History of CVA Stable, continue aspirin. Check lipid panel. Continue apixaban.   Lipid Panel     Component Value Date/Time   CHOL 117 05/01/2013 0528   TRIG 79 05/01/2013 0528   HDL 30 (L) 05/01/2013 0528   CHOLHDL 3.9 05/01/2013 0528   VLDL 16 05/01/2013 0528   LDLCALC 71 05/01/2013 0528   BPH with symptom Has some urinary hesitancy. Continue tamsulosin, good urine outflow, monitor  Gout No recent flare up. Continue allopurinol daily and monitor, check uric acid  afib Controlled HR. Continue metoprolol tartrate 25 mg bid with apixaban 2.5 mg bid for stroke prevention   Family/ staff Communication: reviewed care plan with patient and charge nurse.    Labs/tests ordered: lipid panel, uric acid   I spent 30 minutes in total face-to-face time with the patient, more than 50% of which was spent in counseling and coordination of care, reviewing test results, reviewing medication and discussing or reviewing the diagnosis with patient.      Blanchie Serve, MD Internal Medicine [facility] [address] [place],  [address] Cell Phone (Monday-Friday 8 am - 5 pm): [phone] On Call: [phone] and follow prompts after 5 pm and on weekends Office Phone: [phone] Office Fax: [phone]

## 2016-10-12 ENCOUNTER — Encounter: Payer: Self-pay | Admitting: Nurse Practitioner

## 2016-10-12 ENCOUNTER — Other Ambulatory Visit: Payer: Self-pay | Admitting: *Deleted

## 2016-10-18 DIAGNOSIS — M109 Gout, unspecified: Secondary | ICD-10-CM | POA: Diagnosis not present

## 2016-10-18 DIAGNOSIS — I10 Essential (primary) hypertension: Secondary | ICD-10-CM | POA: Diagnosis not present

## 2016-10-18 LAB — LIPID PANEL
CHOLESTEROL: 130 (ref 0–200)
HDL: 32 — AB (ref 35–70)
LDL Cholesterol: 83
LDl/HDL Ratio: 4.1
Triglycerides: 70 (ref 40–160)

## 2016-10-19 ENCOUNTER — Other Ambulatory Visit: Payer: Self-pay | Admitting: *Deleted

## 2016-10-31 ENCOUNTER — Other Ambulatory Visit: Payer: Self-pay | Admitting: *Deleted

## 2016-11-01 ENCOUNTER — Non-Acute Institutional Stay: Payer: Medicare Other | Admitting: Nurse Practitioner

## 2016-11-01 ENCOUNTER — Encounter: Payer: Self-pay | Admitting: Nurse Practitioner

## 2016-11-01 DIAGNOSIS — E039 Hypothyroidism, unspecified: Secondary | ICD-10-CM

## 2016-11-01 DIAGNOSIS — I5022 Chronic systolic (congestive) heart failure: Secondary | ICD-10-CM | POA: Diagnosis not present

## 2016-11-01 DIAGNOSIS — I480 Paroxysmal atrial fibrillation: Secondary | ICD-10-CM

## 2016-11-01 DIAGNOSIS — N3942 Incontinence without sensory awareness: Secondary | ICD-10-CM

## 2016-11-01 DIAGNOSIS — R609 Edema, unspecified: Secondary | ICD-10-CM | POA: Diagnosis not present

## 2016-11-01 DIAGNOSIS — N62 Hypertrophy of breast: Secondary | ICD-10-CM

## 2016-11-01 DIAGNOSIS — N529 Male erectile dysfunction, unspecified: Secondary | ICD-10-CM

## 2016-11-01 DIAGNOSIS — R5383 Other fatigue: Secondary | ICD-10-CM | POA: Diagnosis not present

## 2016-11-01 DIAGNOSIS — R531 Weakness: Secondary | ICD-10-CM | POA: Insufficient documentation

## 2016-11-01 DIAGNOSIS — G4733 Obstructive sleep apnea (adult) (pediatric): Secondary | ICD-10-CM | POA: Diagnosis not present

## 2016-11-01 DIAGNOSIS — I10 Essential (primary) hypertension: Secondary | ICD-10-CM | POA: Diagnosis not present

## 2016-11-01 NOTE — Assessment & Plan Note (Signed)
Controlled blood pressure, continue Metoprolol 25mg  bid, Losartan 25mg  qd.

## 2016-11-01 NOTE — Assessment & Plan Note (Signed)
The patient desires Viagra, POA, the patient's daughter will think about it, she/patient are aware of the risks and benefits.

## 2016-11-01 NOTE — Progress Notes (Signed)
Location:  [facility] Room Number: 902-A Place of Service:  ALF [phone]) Provider:  Anyely Cunning, Manxie  NP  Blanchie Serve, MD  Patient Care Team: Blanchie Serve, MD as PCP - General (Internal Medicine) [place], Nadean Corwin, MD as Consulting Physician (Cardiology) Ardis Hughs, MD as Attending Physician (Urology) Rudie Rikard X, NP as Nurse Practitioner (Internal Medicine)  Extended Emergency Contact Information Primary Emergency Contact: [name] of [place] Phone: [phone] Relation: Daughter Secondary Emergency Contact: Eyvonne Mechanic [place] of [place] Phone: [phone] Mobile Phone: [phone] Relation: Daughter  Code Status:  Full Code Goals of care: Advanced Directive information Advanced Directives 11/01/2016  Does Patient Have a Medical Advance Directive? Yes  Type of Paramedic of [place];Living will;Out of facility DNR (pink MOST or yellow form)  Does patient want to make changes to medical advance directive? No - Patient declined  Copy of [place] in Chart? Yes  Pre-existing out of facility DNR order (yellow form or pink MOST form) Yellow form placed in chart (order not valid for inpatient use)     Chief Complaint  Patient presents with  . Acute Visit    ? Sleepiness    HPI:  Pt is a 83 y.o. male seen today for an acute visit for " sleepiness", momentarily falls asleep, especially after meals, even if during exercise session. He stated he goes to bed around 10pm, bathroom trip 1x between 2-3-4 am, no difficulty of returning to sleep afterwards. He also reported his increases SOB, occasional clear sputum production and edema in legs. He has increased urinary leakage, urgency-cannot make it to the commode sometimes, denied dysuria, lower abd or CVA discomfort, takes Tamsulosin 0.81m qd. Hx of sleep apnea, desires to have workup and new  machine. Hx of CHF, taking Furosemide 462mqd, off Spironolactone 10/04/16 in setting of tender breasts which is better now. Afib, heart rate is controlled on Metoprolol 2559mid. Blood press is controlled.    Past Medical History:  Diagnosis Date  . Abnormal PFTs 10/30/2012   Followed in Pulmonary clinic/ [facility]/ [place]  - PFT's 10/22/12 VC  55% no obst, DLCO 50%  - PFT's 12/20/2012 VC 83% and no obst, dLCO 55%  -11/08/2012  Walked RA x 3 laps @ 185 ft each stopped due to  End of study, not desat -11/08/12 esr 10    . Abnormality of gait 04/18/2013  . Acute on chronic systolic congestive heart failure, NYHA class 2 ([facility]/12/2006   BNP 376.9 05/28/13   . Adjustment disorder with mixed anxiety and depressed mood 04/18/2013   Post CVA depression and anxiety   . Allergy   . Anxiety   . Aortic aneurysm of unspecified site without mention of rupture 10/27/2011  . Arthritis   . Atherosclerosis of renal artery ([facility]/13/2008  . Atrial fibrillation ([facility]2/28/2012    He was on coumadin until his stroke last December and was switched to apixaban 2.5mg64mily which he has been taking faithfully without reported side effects.    . Balance disorder 07/31/2014  . Basal cell carcinoma of skin of other and unspecified parts of face 12/24/2009  . Blood transfusion without reported diagnosis   . Cardiac pacemaker in situ- MDT 10/11 03/18/2010   Medtronic revo implant in October 2011. Severe sinus bradycardia in the 30s, but not truly pacemaker dependent   . Cataract   . CKD stage 2 due to type  2 diabetes mellitus ([place]) 07/03/2014  . Coronary artery disease   . CVA - Rt brain stroke 12/14 02/18/2013   02/19/13 angiography CT head:  Diffuse atherosclerotic irregularity and plaque formation of the distal right common carotid artery and proximal internal carotid artery without significant stenosis. Plaque ulceration is present and is a source of emboli. A small right thalamic CVA    . DM (diabetes mellitus),  type 2 with renal complications ([place]) 1/61/0960  . Dyslipidemia 11/03/2004  . Fall 03/04/2011  . Fatigue 04/18/2013  . Gout, unspecified 11/03/2004  . Heart murmur   . History of abdominal aortic aneurysm   . History of Doppler ultrasound 05/22/2012   LEAs; R anterior tibial artery appeared occluded; L posterior tibial shows short segment of occlusive ds  . History of Doppler ultrasound 05/22/2012   Abdominal Aortic Doppler; slight increase in fusiform aneurysm   . History of echocardiogram 12/2008   EF >55%; mild concentric LVH; mild MR; mild-mod TR; mild AV regurg;   . History of nuclear stress test 12/2010   lexiscan; low risk; compared to prior study, perfusion improved  . HTN (hypertension) 03/04/2011  . Hyperlipidemia   . Hypertension   . Hypertrophy of prostate with urinary obstruction and other lower urinary tract symptoms (LUTS) 04/28/2004  . Hyponatremia 03/04/2011  . Hypothyroidism 03/04/2011  . Impotence of organic origin 10/03/2000  . Internal hemorrhoids without mention of complication 45/40/9811  . Long term (current) use of anticoagulants 03/17/2011  . Major depressive disorder, single episode, unspecified 03/05/2009  . Memory change 01/24/2013  . Muscle weakness (generalized) 03/10/2011  . Nocturia 10/27/2011  . Obstructive sleep apnea (adult) (pediatric) 07/23/2008  . Open wound of knee, leg (except thigh), and ankle, complicated 11/19/7827  . Osteoarthritis of both knees 07/31/2014  . Osteoarthrosis, unspecified whether generalized or localized, unspecified site 02/05/2003  . PAF (paroxysmal atrial fibrillation) (HCC)    coumadin  . Pain in joint, lower leg 08/04/2011  . Pruritic condition 01/24/2013  . Quadriceps weakness 07/31/2014  . Right bundle branch block 04/19/2006  . 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 Spinal stenosis in cervical region 12/15/2004  . SSS (sick sinus syndrome) ([place]) 04/08/2014  . Stroke ([place])   .  Thyroid disease   . TIA (transient ischemic attack) 04/30/2013  . Type II or unspecified type diabetes mellitus without mention of complication, not stated as uncontrolled 11/24/2004  . Unspecified vitamin D deficiency 10/18/2006  . 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 Increased exposure of the right cornea.    Past Surgical History:  Procedure Laterality Date  . CORONARY ANGIOPLASTY WITH STENT PLACEMENT  2008   stent to SVG to OM  . CORONARY ARTERY BYPASS GRAFT  1989  . EYE SURGERY    . HERNIA REPAIR    . INSERT / REPLACE / REMOVE PACEMAKER    . JOINT REPLACEMENT    . PACEMAKER INSERTION  2011    Allergies  Allergen Reactions  . Quinolones Other (See Comments)    Risk of rupture of AAA  . Caduet [Amlodipine-Atorvastatin] Other (See Comments)    Weakness   . Crestor [Rosuvastatin] Other (See Comments)    Muscle pain/weakness  . Lipitor [Atorvastatin Calcium] Other (See Comments)    Muscle pain/weakness  . Simvastatin Other (See Comments)    Muscle pain/weakness  . Lisinopril Other (See Comments)    Doesn't remember reaction    Outpatient Encounter  Prescriptions as of 11/01/2016  Medication Sig  . allopurinol (ZYLOPRIM) 100 MG tablet Take 50 mg by mouth daily.   Marland Kitchen amoxicillin (AMOXIL) 500 MG capsule Take 500 mg by mouth. Take 4 caps prior to dental procedures  . apixaban (ELIQUIS) 2.5 MG TABS tablet Take 1 tablet (2.5 mg total) by mouth 2 (two) times daily.  Marland Kitchen aspirin EC 81 MG tablet Take 81 mg by mouth daily.  . carboxymethylcellulose (REFRESH PLUS) 0.5 % SOLN Place 1 drop into both eyes 3 (three) times daily. May also do daily as needed  . Cholecalciferol 1000 units tablet Take 2,000 Units by mouth daily.  Marland Kitchen Dextran 70-Hypromellose 0.1-0.3 % SOLN Apply 1 drop to eye 2 (two) times daily as needed.  . furosemide (LASIX) 20 MG tablet Take 20 mg by mouth as needed. If patient has gained 3 lbs in 24 hours or 5 lbs in a week  . furosemide (LASIX) 40 MG  tablet Take 40 mg by mouth daily.   Marland Kitchen ipratropium (ATROVENT) 0.06 % nasal spray Place 1 spray into both nostrils 3 (three) times daily as needed.   Marland Kitchen ipratropium-albuterol (DUONEB) 0.5-2.5 (3) MG/3ML SOLN Take 3 mLs by nebulization 4 (four) times daily as needed.  Marland Kitchen levothyroxine (SYNTHROID, LEVOTHROID) 125 MCG tablet Take 125 mcg by mouth daily before breakfast.  . LORazepam (ATIVAN) 0.5 MG tablet Take 0.25 mg by mouth every 4 (four) hours as needed.   Marland Kitchen losartan (COZAAR) 25 MG tablet Take 1 tablet (25 mg total) by mouth daily.  . meclizine (ANTIVERT) 25 MG tablet Take 25 mg by mouth every 6 (six) hours as needed.   . Menthol, Topical Analgesic, (BIOFREEZE) 4 % GEL Apply topically. Apply to left knee four times daily as needed for pain  . metoprolol tartrate (LOPRESSOR) 25 MG tablet Take 1 tablet (25 mg total) by mouth 2 (two) times daily.  . mupirocin nasal ointment (BACTROBAN) 2 % Place 1 application into the nose 2 (two) times daily as needed.  Marland Kitchen Neomycin-Bacitracin-Polymyxin (TRIPLE ANTIBIOTIC) [phone] OINT Apply 1 application topically daily as needed (apply to bilateral nostrils).  . nitroGLYCERIN (NITROSTAT) 0.4 MG SL tablet Place 0.4 mg under the tongue every 5 (five) minutes as needed for chest pain.   Marland Kitchen olopatadine (PATANOL) 0.1 % ophthalmic solution Place 1 drop into both eyes 2 (two) times daily as needed for allergies.  . OXYGEN Inhale 2 L into the lungs continuous.  . polyethylene glycol (MIRALAX / GLYCOLAX) packet Take 17 g by mouth daily as needed.   . RESOURCE BENEPROTEIN PO Take 15 mLs by mouth daily as needed.  . tamsulosin (FLOMAX) 0.4 MG CAPS capsule Take one cap by mouth daily  . tobramycin-dexamethasone (TOBRADEX) ophthalmic ointment Place 1 application into both eyes daily as needed. Can also use daily at bedtime  . triamcinolone cream (KENALOG) 0.1 % Apply 1 application topically daily.  Marland Kitchen UNABLE TO FIND Med Name: Hydrocortisone cream with applicator 1%. Apply to  hemorrhoids 2 times daily as needed   No facility-administered encounter medications on file as of 11/01/2016.     Review of Systems  Constitutional: Positive for activity change and fatigue. Negative for appetite change, chills and fever.  HENT: Positive for hearing loss. Negative for congestion.   Respiratory: Positive for cough and shortness of breath.   Cardiovascular: Positive for leg swelling. Negative for chest pain and palpitations.  Genitourinary: Positive for urgency. Negative for difficulty urinating, dysuria and frequency.       Urge incontinent  of urine, urinary leakage at times. C/o erectile dysfunction.   Skin: Positive for color change. Negative for pallor and rash.       Breast tenderness is resolved, but still has nipple sensitive to touch. Brown pigmentation R+L lower legs.   Psychiatric/Behavioral: Negative for agitation, behavioral problems, confusion and sleep disturbance. The patient is not nervous/anxious.     Immunization History  Administered Date(s) Administered  . Influenza Whole 12/06/2011, 12/19/2012  . Influenza-Unspecified 01/09/2014, 11/25/2014, 12/23/2015  . PPD Test 05/03/2013  . Pneumococcal Polysaccharide-23 03/07/1986  . Td 04/20/2005   Pertinent  Health Maintenance Due  Topic Date Due  . OPHTHALMOLOGY EXAM  01/12/1932  . PNA vac Low Risk Adult (1 of 2 - PCV13) 01/12/1989  . FOOT EXAM  07/31/2015  . HEMOGLOBIN A1C  06/30/2016  . INFLUENZA VACCINE  10/05/2016   Fall Risk  10/15/2015 06/11/2015 10/23/2014 07/07/2014 02/17/2014  Falls in the past year? No Yes Yes Yes Exclusion - non ambulatory  Number falls in past yr: - _0 -  Injury with Fall? - Yes - No -  Risk for fall due to : - - - History of fall(s);Impaired mobility;Impaired balance/gait -  Follow up - - - Falls evaluation completed;Falls prevention discussed -   Functional Status Survey:    Vitals:   11/01/16 1530  BP: 120/70  Pulse: 70  Resp: 18  Temp: 97.6 F (36.4 C)  SpO2:  96%  Weight: 159 lb 6.4 oz (72.3 kg)  Height: _1  (1.727 m)   Body mass index is 24.24 kg/m. Physical Exam  Constitutional: He is oriented to person, place, and time. He appears well-developed and well-nourished. No distress.  Eyes:  Left pupil irregular   Neck: Normal range of motion. No JVD present. No thyromegaly present.  Cardiovascular: Normal rate and regular rhythm.   Murmur heard. Pulmonary/Chest: Effort normal. No respiratory distress. He has no wheezes. He has rales.  Bibasilar moist rales.   Abdominal: Soft. Bowel sounds are normal. He exhibits no distension. There is no tenderness.  Musculoskeletal: Normal range of motion. He exhibits edema.  1+ BLE  Neurological: He is alert and oriented to person, place, and time. No cranial nerve deficit. Coordination normal.  Skin: Skin is warm and dry. No rash noted. He is not diaphoretic. No erythema. No pallor.  Pigmentation R+L lower legs  Psychiatric: He has a normal mood and affect. His behavior is normal. Judgment and thought content normal.    Labs reviewed:  Recent Labs  06/15/16 09/29/16 10/11/16  NA 133* 133*  133* 134*  K 4.7 4.6  4.6 4.5  BUN 32* 23*  23* 22*  CREATININE 1.3 1.1  1.1 1.0    Recent Labs  05/05/16 06/15/16 09/29/16  AST _2 ALT _3 ALKPHOS 98 82 78    Recent Labs  01/26/16 05/05/16 06/15/16  WBC 6.8 6.4 7.3  HGB 16.1 16.2 16.2  HCT 47 48 48  PLT 171 195 217   Lab Results  Component Value Date   TSH 1.56 09/29/2016   Lab Results  Component Value Date   HGBA1C 6.3 12/31/2015   Lab Results  Component Value Date   CHOL 130 10/18/2016   HDL 32 (A) 10/18/2016   LDLCALC 83 10/18/2016   TRIG 70 10/18/2016   CHOLHDL 3.9 05/01/2013    Significant Diagnostic Results in last 30 days:  No results found.  Assessment/Plan Fatigue As described by the patient, falls asleep  momentarily, will update TSH, Free T3/T4, CBC, BMP, BNP, CXR in setting of increased swelling in  legs, SOB, sputum production. Referral to sleep study, the patient desires to have sleep machine again, hx of sleep apnea.   Gynecomastia Breasts tenderness is resolved, but nipples are still sensitive.   Incontinent of urine C/o urge incontinent of urine, denied dysuria, he is afebrile, no noted suprapubic or CVA tenderness. F/u Urology  Obstructive sleep apnea (adult) (pediatric) Referral to sleep study  Impotence, organic The patient desires Viagra, POA, the patient's daughter will think about it, she/patient are aware of the risks and benefits.   Chronic systolic congestive heart failure (HCC) Highly suspected developing decompensation of CHF, presenting symptoms: increased SOB, sputum production, cough, edema in legs, moist rales bibasilar upon my examination. Will increase Furosemide to 72m po qam. Update BMP BNP CXR  Atrial fibrillation (HAmbrose Pace maker, heart rate is in control, continue Metoprolol 271mbid  HTN (hypertension) Controlled blood pressure, continue Metoprolol 2566mid, Losartan 65m57m.   Hypothyroidism Continue Levothyroxine 165mc65m daily, update TSH free T3, free T4  Edema Worse since off Spironolactone 10/04/16, related to CHF, increase Furosemide to 60mg 66maily. Observe.      Family/ staff Communication: plan of care reviewed with the patient, family, and charge nurse  Labs/tests ordered: BMP BNP CBC CXR TSH Free T3 Free T4  Time spend 35 minutes

## 2016-11-01 NOTE — Assessment & Plan Note (Signed)
Referral to sleep study

## 2016-11-01 NOTE — Assessment & Plan Note (Signed)
Continue Levothyroxine 152mcg po daily, update TSH free T3, free T4

## 2016-11-01 NOTE — Assessment & Plan Note (Signed)
C/o urge incontinent of urine, denied dysuria, he is afebrile, no noted suprapubic or CVA tenderness. F/u Urology

## 2016-11-01 NOTE — Assessment & Plan Note (Signed)
As described by the patient, falls asleep momentarily, will update TSH, Free T3/T4, CBC, BMP, BNP, CXR in setting of increased swelling in legs, SOB, sputum production. Referral to sleep study, the patient desires to have sleep machine again, hx of sleep apnea.

## 2016-11-01 NOTE — Assessment & Plan Note (Signed)
Worse since off Spironolactone 10/04/16, related to CHF, increase Furosemide to 60mg  po daily. Observe.

## 2016-11-01 NOTE — Assessment & Plan Note (Addendum)
Pace maker, heart rate is in control, continue Metoprolol 25mg  bid

## 2016-11-01 NOTE — Assessment & Plan Note (Signed)
Breasts tenderness is resolved, but nipples are still sensitive.

## 2016-11-01 NOTE — Assessment & Plan Note (Signed)
Highly suspected developing decompensation of CHF, presenting symptoms: increased SOB, sputum production, cough, edema in legs, moist rales bibasilar upon my examination. Will increase Furosemide to 60mg  po qam. Update BMP BNP CXR

## 2016-11-02 DIAGNOSIS — J811 Chronic pulmonary edema: Secondary | ICD-10-CM | POA: Diagnosis not present

## 2016-11-03 DIAGNOSIS — N3942 Incontinence without sensory awareness: Secondary | ICD-10-CM | POA: Diagnosis not present

## 2016-11-03 DIAGNOSIS — I10 Essential (primary) hypertension: Secondary | ICD-10-CM | POA: Diagnosis not present

## 2016-11-03 DIAGNOSIS — I480 Paroxysmal atrial fibrillation: Secondary | ICD-10-CM | POA: Diagnosis not present

## 2016-11-03 DIAGNOSIS — E039 Hypothyroidism, unspecified: Secondary | ICD-10-CM | POA: Diagnosis not present

## 2016-11-03 LAB — CBC AND DIFFERENTIAL
HCT: 46 (ref 41–53)
HEMOGLOBIN: 15.5 (ref 13.5–17.5)
Platelets: 229 (ref 150–399)
WBC: 6.1

## 2016-11-03 LAB — BASIC METABOLIC PANEL
BUN: 23 — AB (ref 4–21)
Creatinine: 1 (ref <=1.3)
GLUCOSE: 96
POTASSIUM: 4.4 (ref 3.4–5.3)
SODIUM: 135 — AB (ref 137–147)

## 2016-11-03 LAB — TSH: TSH: 1.69 (ref <=5.90)

## 2016-11-04 ENCOUNTER — Other Ambulatory Visit: Payer: Self-pay | Admitting: *Deleted

## 2016-11-08 ENCOUNTER — Ambulatory Visit (INDEPENDENT_AMBULATORY_CARE_PROVIDER_SITE_OTHER): Payer: Medicare Other | Admitting: Internal Medicine

## 2016-11-08 ENCOUNTER — Encounter: Payer: Self-pay | Admitting: Internal Medicine

## 2016-11-08 VITALS — BP 122/62 | HR 70 | Ht 67.0 in | Wt 159.0 lb

## 2016-11-08 DIAGNOSIS — R400 Somnolence: Secondary | ICD-10-CM | POA: Insufficient documentation

## 2016-11-08 DIAGNOSIS — R5383 Other fatigue: Secondary | ICD-10-CM | POA: Diagnosis not present

## 2016-11-08 DIAGNOSIS — R3915 Urgency of urination: Secondary | ICD-10-CM | POA: Diagnosis not present

## 2016-11-08 DIAGNOSIS — I4891 Unspecified atrial fibrillation: Secondary | ICD-10-CM | POA: Diagnosis not present

## 2016-11-08 DIAGNOSIS — I5022 Chronic systolic (congestive) heart failure: Secondary | ICD-10-CM | POA: Diagnosis not present

## 2016-11-08 DIAGNOSIS — I6340 Cerebral infarction due to embolism of unspecified cerebral artery: Secondary | ICD-10-CM

## 2016-11-08 DIAGNOSIS — Z8669 Personal history of other diseases of the nervous system and sense organs: Secondary | ICD-10-CM | POA: Diagnosis not present

## 2016-11-08 DIAGNOSIS — N3941 Urge incontinence: Secondary | ICD-10-CM | POA: Diagnosis not present

## 2016-11-08 NOTE — Patient Instructions (Addendum)
Dr. Debara Pickett has ordered an overnight oximetry study.   Your physician wants you to follow-up in: 6 months with Dr. Debara Pickett. You will receive a reminder letter in the mail two months in advance. If you don't receive a letter, please call our office to schedule the follow-up appointment.

## 2016-11-08 NOTE — Progress Notes (Signed)
OFFICE NOTE  Chief Complaint:  Daytime fatigue  Primary Care Physician: Blanchie Serve, MD  HPI:  Jon [name]  is a 83 yo male formerly followed by Dr. Rex Kras and recently seen by Cecilie Kicks, NP, for left lower extremity edema with discoloration of his toes, was beginning to be uncomfortable for him to walk. We did venous Dopplers. He was quite concerned about arterial blood supply. His mother ended up with bilateral amputations. Venous Dopplers showed a ruptured baker cyst, no DVT. He was instructed on this and since that time his symptoms have resolved completely. He is on Coumadin for paroxysmal A-fib which led to the discoloration in his toes. Additionally, he has a history of abdominal aortic aneurysm and because he was concerned about his arterial disease, if he had it, we did Dopplers.  He is here for the results of the Dopplers. Bilateral ABIs were 1.0, though he does have appearance of an occluded right anterior tibial artery and a left posterior tib demonstrated a short segment of occlusive disease with reconstitution at the ankle. His pulses have been 2+. He has no claudication symptoms. Additionally, the duplex of his abdominal aorta was slightly, minimally changed from his last one. Previously it was 3.95 x 3.57, now he is 3.7 x 4.0, essentially the same, very slight increase. He has no abdominal pain and no other complaints. He has really no complaints today, feels quite well. He is not aware of any tachycardias or palpitations. He has a Medtronic pacemaker which was interrogated today. This demonstrates a battery voltage of 3.0V.  His a-fib burden is 1.6% and he is on amiodarone.  Other history includes bypass grafting in 1989; vein graft was stented in 2008. Last stress test was 2012, low risk study. EF was 49%. Pacemaker was placed for paroxysmal A-fib and bradycardia, sick sinus syndrome in 2011. He is also on warfarin.   Jon [name] underwent PFTs on 10/22/2012. This  showed a moderately severe restrictive limitation as well as moderately reduced diffusion capacity. Based on these findings I recommended discontinuing his amiodarone and he also stopped his pravastatin. Since that time he's noted that he feels quite a bit better including some minor improvement in his knee pain. He still feels like his left knee is bothering him and he is status post right TKR. 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 I did refer him to the [facility], but has not been contacted for appointment so far.  Since his last followup, Jon [name] was seen by Dr. Sallyanne Kuster for a device check in his device appeared to be working properly. He was having atrial fibrillation, but a little burden. He continued on warfarin which was generally therapeutic. Unfortunately, in December he had a thalamic stroke which resulted in some right facial droop and hemiparesis. That is improving , but slowly. He was then changed to Eliquis for anticoagulation. He was also started on Zetia, as he has a history of statin intolerance in the past.  An echo performed in the hospital demonstrated an EF of 40-45%, which is mildly reduced from previous studies. In addition, he has had a small amount of weight gain and lower extremity swelling which is noted over the past several weeks while in recovery.  Unfortunately, he suffered another stroke last month around the time that we plan to admit him for tikosyn induction. He is still trying to recover from that.   Jon [name] appears euvolemic today.  He does have a small amount of lower extremity swelling. His weight has been stable. He is reporting some epistaxis. He is concerned about the dose of his Eliquis. He is also reporting some urinary incontinence which is new. There is some flank pain and there is some possible concern for urinary tract infection however he is awaiting the results of a urinalysis.  Jon [name] was seen in the office today as an  add-on for worsening swelling and shortness of breath. I reviewed notes from [name] which indicate in January because of excessive urination he requested to come off of his diuretics. He was taken off the diuretics but quickly developed swelling and worsening shortness of breath. A BNP was obtained which was greater than 300. He was restarted on his diuretics and had some improvement in his shortness of breath but still has persistent symptoms. He's also developed asymmetric right lower extremity edema. He underwent an in office Doppler ultrasound that was negative for DVT. Those results are communicated in the office notes, but no formal radiology report is available. It would be unlikely for him to have DVT given the fact that he's on Eliquis. He still has persistent right lower extremity edema. He reports shortness of breath, fatigue and difficulty speaking. He denies any chest pain.  He is also overdue for pacemaker check and that was performed in the office today under my supervision.  Jon [name] returns today in the office. He reports an improvement in his shortness of breath with an increased dose of Lasix. An echocardiogram was performed which shows preserved systolic function of the left ventricle however there is significant right heart dysfunction and severe dilatation of the right ventricle. This is likely the cause of his shortness of breath. He seems to respond and diaphoretic. He reports his fatigue is improving and is currently working with physical therapy.  I saw Jon [name] back today in the office. When I last saw him he had had some leg swelling and I recommended increasing his Lasix to 40 mg twice daily. Unfortunately due to problems with incontinence he did not make those changes. Over the past 3 months there is been about a 13 pound weight gain. He has significant leg swelling. This is likely due to some right heart failure symptoms which were suspected at his last office  visit. He does get a little more short of breath with exertion as well.  Jon [name] returns again today for close follow-up. He is now been taking Lasix 40 mg twice daily. His weight is actually down 3-4 pounds since his last office visit. He's been urinating quite a bit more. He did [name] Dr. Louis Meckel for a urology evaluation. His medicines were adjusted and although he was having some urinary retention now is having problems with incontinence. He appears to have had some improvement in his swelling and overall some mild improvement in shortness of breath however the other night was noted to be short of breath and hypoxic. He was placed on oxygen overnight. Is not clear whether he will need nocturnal oxygen however he does have a remote history of sleep apnea and has not used CPAP in some time. Labs in early October indicated elevated BNP of 350, but this is not been reassessed since diuresis.  Jon [name] reports some improvement in his symptoms. He's more energetic today to fact has had recent treatment of his knees with injections and therapy at [facility]. He says this has helped significantly. He is walking  and does appear somewhat brighter today. Generally his energy level is pretty good although he naps fairly regularly. His weight is been stable. He denies any worsening shortness of breath or chest pain. He just did a remote check and will be due to [name] Dr. Loletha Grayer back in the office in a few months. He is overdue for abdominal ultrasound as he had a small aortic aneurysm which was seen in 2014. He's also had carotid Dopplers which showed mild carotid disease.  11/03/2015  Jon [name] returns today generally without any complaints. He looks like he's gained a few pounds but does not appear volume overloaded. He denies any chest pain or worsening shortness of breath. Fortunately he has had no falls. He has been struggling with somewhat lower blood pressure. He saw Dr. Sallyanne Kuster back in May and his blood pressure  medication was decreased. He still has some problems with hypotension. He seems to be asymptomatic with this however there is concern about his risk for falls. His for his blood pressure medication he is on losartan and metoprolol. 8 check of his pacemaker shows a persistent atrial fibrillation. It is normally functioning. He recently had normalization of his LVEF. He denies any bleeding problems on Eliquis which she is taking for recent thalamic stroke in the setting of A. fib. He had Dopplers this year of his abdominal aortic aneurysm which shows stable findings.  05/09/2016  Jon [name] returns today for follow-up. Overall he seems to be feeling well although he is sleeping quite a bit. He wondered if this could be due to vitamin D deficiency. His daughter thinks he should be on 2000 units of vitamin D a day versus 800 units. I don't [name] any problem with this although recently vitamin D level was appropriate at 46. He denies any chest pain any significant shortness of breath or worsening edema. Interestingly he recently has had remote pacemaker checks which show a frequency of pacing greater than 95%. He is on high-dose metoprolol 100 twice daily, therefore he may have some degree of medication related chronotropic incompetence.  06/17/2016  Jon [name] returns today for follow-up. Overall doing well. I decreased his metoprolol last time and he says he thinks he feels a little more energy. He also likes been on a higher dose of vitamin D. He says his skin is clear.Marland Kitchen He's also developed a little relationship with a lady at [facility] home which she seems to be quite guilty about. I'm pleased form. He has had no worsening edema and weight has been fairly stable.  11/08/2016  Jon [name] returns today for follow-up. He is concerned about significant daytime fatigue. This is been going on for a while but recently over the past week it's been very significant. He says he can fall asleep in the chair or in his powered  wheelchair. Blood pressure however is better. It's been low recently however normal today. We'll need to continue to monitor that as low blood pressures might be associated with it. He previously has a history of obstructive sleep apnea but does not wear CPAP. He was on oxygen for a while but taken off of that. I wonder if he could be hypoxic at night. It would be reasonable to consider overnight oximetry. His last note from the nurse practitioner friend some indicated that he had asked for Viagra however he denies it today. Fact he says he is not interested in it.  PMHx:  Past Medical History:  Diagnosis Date  . Abnormal PFTs 10/30/2012  Followed in Pulmonary clinic/ [facility]/ [place]  - PFT's 10/22/12 VC  55% no obst, DLCO 50%  - PFT's 12/20/2012 VC 83% and no obst, dLCO 55%  -11/08/2012  Walked RA x 3 laps @ 185 ft each stopped due to  End of study, not desat -11/08/12 esr 10    . Abnormality of gait 04/18/2013  . Acute on chronic systolic congestive heart failure, NYHA class 2 ([place]) 03/16/2006   BNP 376.9 05/28/13   . Adjustment disorder with mixed anxiety and depressed mood 04/18/2013   Post CVA depression and anxiety   . Allergy   . Anxiety   . Aortic aneurysm of unspecified site without mention of rupture 10/27/2011  . Arthritis   . Atherosclerosis of renal artery ([place]) 10/18/2006  . Atrial fibrillation ([place]) 03/04/2011    He was on coumadin until his stroke last December and was switched to apixaban 2.3m daily which he has been taking faithfully without reported side effects.    . Balance disorder 07/31/2014  . Basal cell carcinoma of skin of other and unspecified parts of face 12/24/2009  . Blood transfusion without reported diagnosis   . Cardiac pacemaker in situ- MDT 10/11 03/18/2010   Medtronic revo implant in October 2011. Severe sinus bradycardia in the 30s, but not truly pacemaker dependent   . Cataract   . CKD stage 2 due to type 2 diabetes mellitus ([place] 07/03/2014  . Coronary  artery disease   . CVA - Rt brain stroke 12/14 02/18/2013   02/19/13 angiography CT head:  Diffuse atherosclerotic irregularity and plaque formation of the distal right common carotid artery and proximal internal carotid artery without significant stenosis. Plaque ulceration is present and is a source of emboli. A small right thalamic CVA    . DM (diabetes mellitus), type 2 with renal complications ([place] 46/28/3662 . Dyslipidemia 11/03/2004  . Fall 03/04/2011  . Fatigue 04/18/2013  . Gout, unspecified 11/03/2004  . Heart murmur   . History of abdominal aortic aneurysm   . History of Doppler ultrasound 05/22/2012   LEAs; R anterior tibial artery appeared occluded; L posterior tibial shows short segment of occlusive ds  . History of Doppler ultrasound 05/22/2012   Abdominal Aortic Doppler; slight increase in fusiform aneurysm   . History of echocardiogram 12/2008   EF >55%; mild concentric LVH; mild MR; mild-mod TR; mild AV regurg;   . History of nuclear stress test 12/2010   lexiscan; low risk; compared to prior study, perfusion improved  . HTN (hypertension) 03/04/2011  . Hyperlipidemia   . Hypertension   . Hypertrophy of prostate with urinary obstruction and other lower urinary tract symptoms (LUTS) 04/28/2004  . Hyponatremia 03/04/2011  . Hypothyroidism 03/04/2011  . Impotence of organic origin 10/03/2000  . Internal hemorrhoids without mention of complication 194/76/5465 . Long term (current) use of anticoagulants 03/17/2011  . Major depressive disorder, single episode, unspecified 03/05/2009  . Memory change 01/24/2013  . Muscle weakness (generalized) 03/10/2011  . Nocturia 10/27/2011  . Obstructive sleep apnea (adult) (pediatric) 07/23/2008  . Open wound of knee, leg (except thigh), and ankle, complicated 50/35/4656 . Osteoarthritis of both knees 07/31/2014  . Osteoarthrosis, unspecified whether generalized or localized, unspecified site 02/05/2003  . PAF (paroxysmal atrial fibrillation)  (HCC)    coumadin  . Pain in joint, lower leg 08/04/2011  . Pruritic condition 01/24/2013  . Quadriceps weakness 07/31/2014  . Right bundle branch block 04/19/2006  . Speech and language deficit due to old  cerebral infarction 04/18/2013   Slurred speech   . Spinal stenosis in cervical region 12/15/2004  . SSS (sick sinus syndrome) ([place]) 04/08/2014  . Stroke ([place])   . Thyroid disease   . TIA (transient ischemic attack) 04/30/2013  . Type II or unspecified type diabetes mellitus without mention of complication, not stated as uncontrolled 11/24/2004  . Unspecified vitamin D deficiency 10/18/2006  . 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 Increased exposure of the right cornea.     Past Surgical History:  Procedure Laterality Date  . CORONARY ANGIOPLASTY WITH STENT PLACEMENT  2008   stent to SVG to OM  . CORONARY ARTERY BYPASS GRAFT  1989  . EYE SURGERY    . HERNIA REPAIR    . INSERT / REPLACE / REMOVE PACEMAKER    . JOINT REPLACEMENT    . PACEMAKER INSERTION  2011    FAMHx:  Family History  Problem Relation Age of Onset  . Diabetes Father   . Other Mother        PAD with amputations    SOCHx:   reports that he quit smoking about 49 years ago. His smoking use included Cigarettes. He has a 11.25 pack-year smoking history. He has never used smokeless tobacco. He reports that he does not drink alcohol or use drugs.  ALLERGIES:  Allergies  Allergen Reactions  . Quinolones Other ([name] Comments)    Risk of rupture of AAA  . Caduet [Amlodipine-Atorvastatin] Other ([name] Comments)    Weakness   . Crestor [Rosuvastatin] Other ([name] Comments)    Muscle pain/weakness  . Lipitor [Atorvastatin Calcium] Other ([name] Comments)    Muscle pain/weakness  . Simvastatin Other ([name] Comments)    Muscle pain/weakness  . Lisinopril Other ([name] Comments)    Doesn't remember reaction    ROS: Pertinent items noted in HPI and remainder of comprehensive ROS otherwise negative.  HOME  MEDS: Current Outpatient Prescriptions  Medication Sig Dispense Refill  . allopurinol (ZYLOPRIM) 100 MG tablet Take 50 mg by mouth daily.     Marland Kitchen amoxicillin (AMOXIL) 500 MG capsule Take 500 mg by mouth. Take 4 caps prior to dental procedures    . apixaban (ELIQUIS) 2.5 MG TABS tablet Take 1 tablet (2.5 mg total) by mouth 2 (two) times daily. 60 tablet 11  . aspirin EC 81 MG tablet Take 81 mg by mouth daily.    . carboxymethylcellulose (REFRESH PLUS) 0.5 % SOLN Place 1 drop into both eyes 3 (three) times daily. May also do daily as needed    . Cholecalciferol 1000 units tablet Take 2,000 Units by mouth daily.    Marland Kitchen Dextran 70-Hypromellose 0.1-0.3 % SOLN Apply 1 drop to eye 2 (two) times daily as needed.    . furosemide (LASIX) 20 MG tablet Take 20 mg by mouth as needed. If patient has gained 3 lbs in 24 hours or 5 lbs in a week    . furosemide (LASIX) 40 MG tablet Take 40 mg by mouth daily.     Marland Kitchen ipratropium (ATROVENT) 0.06 % nasal spray Place 1 spray into both nostrils 3 (three) times daily as needed.     Marland Kitchen ipratropium-albuterol (DUONEB) 0.5-2.5 (3) MG/3ML SOLN Take 3 mLs by nebulization 4 (four) times daily as needed.    Marland Kitchen levothyroxine (SYNTHROID, LEVOTHROID) 125 MCG tablet Take 125 mcg by mouth daily before breakfast.    . LORazepam (ATIVAN) 0.5 MG tablet Take 0.25 mg by mouth every 4 (four) hours as needed.     Marland Kitchen  losartan (COZAAR) 25 MG tablet Take 1 tablet (25 mg total) by mouth daily. 90 tablet 3  . meclizine (ANTIVERT) 25 MG tablet Take 25 mg by mouth every 6 (six) hours as needed.     . Menthol, Topical Analgesic, (BIOFREEZE) 4 % GEL Apply topically. Apply to left knee four times daily as needed for pain    . metoprolol tartrate (LOPRESSOR) 25 MG tablet Take 1 tablet (25 mg total) by mouth 2 (two) times daily. 180 tablet 3  . mupirocin nasal ointment (BACTROBAN) 2 % Place 1 application into the nose 2 (two) times daily as needed.    Marland Kitchen Neomycin-Bacitracin-Polymyxin (TRIPLE ANTIBIOTIC)  [phone] OINT Apply 1 application topically daily as needed (apply to bilateral nostrils).    . nitroGLYCERIN (NITROSTAT) 0.4 MG SL tablet Place 0.4 mg under the tongue every 5 (five) minutes as needed for chest pain.     Marland Kitchen olopatadine (PATANOL) 0.1 % ophthalmic solution Place 1 drop into both eyes 2 (two) times daily as needed for allergies.    . OXYGEN Inhale 2 L into the lungs continuous.    . polyethylene glycol (MIRALAX / GLYCOLAX) packet Take 17 g by mouth daily as needed.     . RESOURCE BENEPROTEIN PO Take 15 mLs by mouth daily as needed.    . tamsulosin (FLOMAX) 0.4 MG CAPS capsule Take one cap by mouth daily    . tobramycin-dexamethasone (TOBRADEX) ophthalmic ointment Place 1 application into both eyes daily as needed. Can also use daily at bedtime    . triamcinolone cream (KENALOG) 0.1 % Apply 1 application topically daily.    Marland Kitchen UNABLE TO FIND Med Name: Hydrocortisone cream with applicator 1%. Apply to hemorrhoids 2 times daily as needed     No current facility-administered medications for this visit.     LABS/IMAGING: No results found for this or any previous visit (from the past 48 hour(s)). No results found.  VITALS: BP 122/62   Pulse 70   Ht _0  (1.702 m)   Wt 159 lb (72.1 kg)   BMI 24.90 kg/m   EXAM: General appearance: alert and no distress Neck: no carotid bruit, no JVD and thyroid not enlarged, symmetric, no tenderness/mass/nodules Lungs: clear to auscultation bilaterally Heart: regular rate and rhythm Abdomen: soft, non-tender; bowel sounds normal; no masses,  no organomegaly Extremities: extremities normal, atraumatic, no cyanosis or edema Pulses: 2+ and symmetric Skin: Skin color, texture, turgor normal. No rashes or lesions Neurologic: Grossly normal Psych: pleasant  EKG: A. fib with PVCs at 70, RBBB, inferolateral T-wave changes-personally reviewed  ASSESSMENT: 1. Daytime fatigue/sleepiness 2. Chronic systolic, right sided- congestive heart  failure, NYHA Class II symptoms - EF 55-60% 3. Persistent atrial fibrillation 4. Sick sinus syndrome status post pacemaker placement with normal function 5. Abnormal PFT's - off of amiodarone 6. History of abdominal aortic aneurysm with mild enlargement 7. Coronary artery disease status post CABG in 1989 with stent to the vein graft in 2008 8. Minimal PAD with normal ABIs bilaterally 9. Recent thalamic stroke - now on Eliquis 10. Epistaxis 11. Urinary incontinence 12. Mild bilateral carotid artery disease  PLAN: 1.   Jon [name] has his main complaint of daytime fatigue and sleepiness. I'm not certain this is related to blood pressure or A. fib which is been in for some time. His pacemaker seems to be working appropriately. I suspect it could be related to her sleep disorder. He has a history of OSA in the past but is not on  treatment. He was on oxygen for a while but came off of that. I think he benefit from overnight oximetry and may need nocturnal oxygen. We'll try to arrange that at friend's home.  Follow-up in 6 months.  Pixie Casino, MD, [facility] Attending Cardiologist [place] 11/08/2016, 8:55 AM

## 2016-11-09 ENCOUNTER — Ambulatory Visit (INDEPENDENT_AMBULATORY_CARE_PROVIDER_SITE_OTHER): Payer: Medicare Other | Admitting: *Deleted

## 2016-11-09 ENCOUNTER — Telehealth: Payer: Self-pay | Admitting: Cardiology

## 2016-11-09 ENCOUNTER — Telehealth: Payer: Self-pay | Admitting: Internal Medicine

## 2016-11-09 DIAGNOSIS — I495 Sick sinus syndrome: Secondary | ICD-10-CM

## 2016-11-09 NOTE — Telephone Encounter (Signed)
Spoke with pt and reminded pt of remote transmission that is due today. Pt verbalized understanding.   

## 2016-11-09 NOTE — Telephone Encounter (Signed)
[place] and LM for a staff member to call back regarding overnight oximetry orders to determine if there is a vendor they use for these sort of tests.

## 2016-11-10 NOTE — Progress Notes (Signed)
Remote pacemaker transmission.   

## 2016-11-15 ENCOUNTER — Non-Acute Institutional Stay: Payer: Medicare Other

## 2016-11-15 ENCOUNTER — Encounter: Payer: Self-pay | Admitting: Cardiology

## 2016-11-15 DIAGNOSIS — Z0000 Encounter for general adult medical examination without abnormal findings: Secondary | ICD-10-CM

## 2016-11-15 NOTE — Telephone Encounter (Signed)
Contacted nurse at [facility] and they do not have a certain home care agency used for testing for assisted living residents.   [place] w/[facility]. He reviewed patient's chart and with patient's Medicare and having dx of OSA, he would need an in lab sleep study with CPAP titration OR an office visit documenting that he is having SOB from a heart failure standpoint and document methods of treatment patient is currently on and if any are failed and would need to have an ambulating O2 test.  Will routed to MD to determine best course of action

## 2016-11-15 NOTE — Patient Instructions (Addendum)
Jon [name] , Thank you for taking time to come for your Medicare Wellness Visit. I appreciate your ongoing commitment to your health goals. Please review the following plan we discussed and let me know if I can assist you in the future.   Screening recommendations/referrals: Colonoscopy excluded pt over age 81 Recommended yearly ophthalmology/optometry visit for glaucoma screening and checkup Recommended yearly dental visit for hygiene and checkup  Vaccinations: Influenza vaccine due Pneumococcal vaccine 13 due Tdap vaccine due Shingles vaccine not in records    Advanced directives: In Chart  Conditions/risks identified: None  Next appointment: Dr. Bubba Camp makes rounds  Preventive Care 102 Years and Older, Male Preventive care refers to lifestyle choices and visits with your health care provider that can promote health and wellness. What does preventive care include?  A yearly physical exam. This is also called an annual well check.  Dental exams once or twice a year.  Routine eye exams. Ask your health care provider how often you should have your eyes checked.  Personal lifestyle choices, including:  Daily care of your teeth and gums.  Regular physical activity.  Eating a healthy diet.  Avoiding tobacco and drug use.  Limiting alcohol use.  Practicing safe sex.  Taking low doses of aspirin every day.  Taking vitamin and mineral supplements as recommended by your health care provider. What happens during an annual well check? The services and screenings done by your health care provider during your annual well check will depend on your age, overall health, lifestyle risk factors, and family history of disease. Counseling  Your health care provider may ask you questions about your:  Alcohol use.  Tobacco use.  Drug use.  Emotional well-being.  Home and relationship well-being.  Sexual activity.  Eating habits.  History of falls.  Memory and ability to  understand (cognition).  Work and work Statistician. Screening  You may have the following tests or measurements:  Height, weight, and BMI.  Blood pressure.  Lipid and cholesterol levels. These may be checked every 5 years, or more frequently if you are over 69 years old.  Skin check.  Lung cancer screening. You may have this screening every year starting at age 66 if you have a 30-pack-year history of smoking and currently smoke or have quit within the past 15 years.  Fecal occult blood test (FOBT) of the stool. You may have this test every year starting at age 67.  Flexible sigmoidoscopy or colonoscopy. You may have a sigmoidoscopy every 5 years or a colonoscopy every 10 years starting at age 51.  Prostate cancer screening. Recommendations will vary depending on your family history and other risks.  Hepatitis C blood test.  Hepatitis B blood test.  Sexually transmitted disease (STD) testing.  Diabetes screening. This is done by checking your blood sugar (glucose) after you have not eaten for a while (fasting). You may have this done every 1-3 years.  Abdominal aortic aneurysm (AAA) screening. You may need this if you are a current or former smoker.  Osteoporosis. You may be screened starting at age 39 if you are at high risk. Talk with your health care provider about your test results, treatment options, and if necessary, the need for more tests. Vaccines  Your health care provider may recommend certain vaccines, such as:  Influenza vaccine. This is recommended every year.  Tetanus, diphtheria, and acellular pertussis (Tdap, Td) vaccine. You may need a Td booster every 10 years.  Zoster vaccine. You may need this  after age 81.  Pneumococcal 13-valent conjugate (PCV13) vaccine. One dose is recommended after age 3.  Pneumococcal polysaccharide (PPSV23) vaccine. One dose is recommended after age 42. Talk to your health care provider about which screenings and vaccines you  need and how often you need them. This information is not intended to replace advice given to you by your health care provider. Make sure you discuss any questions you have with your health care provider. Document Released: 03/20/2015 Document Revised: 11/11/2015 Document Reviewed: 12/23/2014 Elsevier Interactive Patient Education  2017 [place] Prevention in the Home Falls can cause injuries. They can happen to people of all ages. There are many things you can do to make your home safe and to help prevent falls. What can I do on the outside of my home?  Regularly fix the edges of walkways and driveways and fix any cracks.  Remove anything that might make you trip as you walk through a door, such as a raised step or threshold.  Trim any bushes or trees on the path to your home.  Use bright outdoor lighting.  Clear any walking paths of anything that might make someone trip, such as rocks or tools.  Regularly check to see if handrails are loose or broken. Make sure that both sides of any steps have handrails.  Any raised decks and porches should have guardrails on the edges.  Have any leaves, snow, or ice cleared regularly.  Use sand or salt on walking paths during winter.  Clean up any spills in your garage right away. This includes oil or grease spills. What can I do in the bathroom?  Use night lights.  Install grab bars by the toilet and in the tub and shower. Do not use towel bars as grab bars.  Use non-skid mats or decals in the tub or shower.  If you need to sit down in the shower, use a plastic, non-slip stool.  Keep the floor dry. Clean up any water that spills on the floor as soon as it happens.  Remove soap buildup in the tub or shower regularly.  Attach bath mats securely with double-sided non-slip rug tape.  Do not have throw rugs and other things on the floor that can make you trip. What can I do in the bedroom?  Use night lights.  Make sure  that you have a light by your bed that is easy to reach.  Do not use any sheets or blankets that are too big for your bed. They should not hang down onto the floor.  Have a firm chair that has side arms. You can use this for support while you get dressed.  Do not have throw rugs and other things on the floor that can make you trip. What can I do in the kitchen?  Clean up any spills right away.  Avoid walking on wet floors.  Keep items that you use a lot in easy-to-reach places.  If you need to reach something above you, use a strong step stool that has a grab bar.  Keep electrical cords out of the way.  Do not use floor polish or wax that makes floors slippery. If you must use wax, use non-skid floor wax.  Do not have throw rugs and other things on the floor that can make you trip. What can I do with my stairs?  Do not leave any items on the stairs.  Make sure that there are handrails on both sides of the  stairs and use them. Fix handrails that are broken or loose. Make sure that handrails are as long as the stairways.  Check any carpeting to make sure that it is firmly attached to the stairs. Fix any carpet that is loose or worn.  Avoid having throw rugs at the top or bottom of the stairs. If you do have throw rugs, attach them to the floor with carpet tape.  Make sure that you have a light switch at the top of the stairs and the bottom of the stairs. If you do not have them, ask someone to add them for you. What else can I do to help prevent falls?  Wear shoes that:  Do not have high heels.  Have rubber bottoms.  Are comfortable and fit you well.  Are closed at the toe. Do not wear sandals.  If you use a stepladder:  Make sure that it is fully opened. Do not climb a closed stepladder.  Make sure that both sides of the stepladder are locked into place.  Ask someone to hold it for you, if possible.  Clearly mark and make sure that you can see:  Any grab bars or  handrails.  First and last steps.  Where the edge of each step is.  Use tools that help you move around (mobility aids) if they are needed. These include:  Canes.  Walkers.  Scooters.  Crutches.  Turn on the lights when you go into a dark area. Replace any light bulbs as soon as they burn out.  Set up your furniture so you have a clear path. Avoid moving your furniture around.  If any of your floors are uneven, fix them.  If there are any pets around you, be aware of where they are.  Review your medicines with your doctor. Some medicines can make you feel dizzy. This can increase your chance of falling. Ask your doctor what other things that you can do to help prevent falls. This information is not intended to replace advice given to you by your health care provider. Make sure you discuss any questions you have with your health care provider. Document Released: 12/18/2008 Document Revised: 07/30/2015 Document Reviewed: 03/28/2014 Elsevier Interactive Patient Education  2017 [employer].

## 2016-11-15 NOTE — Progress Notes (Signed)
Subjective:   Jon [name] is a 83 y.o. male who presents for Medicare Annual/Subsequent preventive examination at [place]     Objective:    Vitals: BP (!) 122/52 (BP Location: Left Arm, Patient Position: Sitting)   Pulse 60   Temp (!) 97.4 F (36.3 C) (Oral)   Ht '5\' 7"'$  (1.702 m)   Wt 159 lb (72.1 kg)   SpO2 97%   BMI 24.90 kg/m   Body mass index is 24.9 kg/m.  Tobacco History  Smoking Status  . Former Smoker  . Packs/day: 0.75  . Years: 15.00  . Types: Cigarettes  . Quit date: 03/04/1967  Smokeless Tobacco  . Never Used     Counseling given: Not Answered   Past Medical History:  Diagnosis Date  . Abnormal PFTs 10/30/2012   Followed in Pulmonary clinic/ [facility]/ [place]  - PFT's 10/22/12 VC  55% no obst, DLCO 50%  - PFT's 12/20/2012 VC 83% and no obst, dLCO 55%  -11/08/2012  Walked RA x 3 laps @ 185 ft each stopped due to  End of study, not desat -11/08/12 esr 10    . Abnormality of gait 04/18/2013  . Acute on chronic systolic congestive heart failure, NYHA class 2 ([place]) 03/16/2006   BNP 376.9 05/28/13   . Adjustment disorder with mixed anxiety and depressed mood 04/18/2013   Post CVA depression and anxiety   . Allergy   . Anxiety   . Aortic aneurysm of unspecified site without mention of rupture 10/27/2011  . Arthritis   . Atherosclerosis of renal artery ([place]) 10/18/2006  . Atrial fibrillation ([place]) 03/04/2011    He was on coumadin until his stroke last December and was switched to apixaban 2.'5mg'$  daily which he has been taking faithfully without reported side effects.    . Balance disorder 07/31/2014  . Basal cell carcinoma of skin of other and unspecified parts of face 12/24/2009  . Blood transfusion without reported diagnosis   . Cardiac pacemaker in situ- MDT 10/11 03/18/2010   Medtronic revo implant in October 2011. Severe sinus bradycardia in the 30s, but not truly pacemaker dependent   . Cataract   . CKD stage 2 due  to type 2 diabetes mellitus ([place]) 07/03/2014  . Coronary artery disease   . CVA - Rt brain stroke 12/14 02/18/2013   02/19/13 angiography CT head:  Diffuse atherosclerotic irregularity and plaque formation of the distal right common carotid artery and proximal internal carotid artery without significant stenosis. Plaque ulceration is present and is a source of emboli. A small right thalamic CVA    . DM (diabetes mellitus), type 2 with renal complications ([place]) 1/51/7616  . Dyslipidemia 11/03/2004  . Fall 03/04/2011  . Fatigue 04/18/2013  . Gout, unspecified 11/03/2004  . Heart murmur   . History of abdominal aortic aneurysm   . History of Doppler ultrasound 05/22/2012   LEAs; R anterior tibial artery appeared occluded; L posterior tibial shows short segment of occlusive ds  . History of Doppler ultrasound 05/22/2012   Abdominal Aortic Doppler; slight increase in fusiform aneurysm   . History of echocardiogram 12/2008   EF >55%; mild concentric LVH; mild MR; mild-mod TR; mild AV regurg;   . History of nuclear stress test 12/2010   lexiscan; low risk; compared to prior study, perfusion improved  . HTN (hypertension) 03/04/2011  . Hyperlipidemia   . Hypertension   . Hypertrophy of prostate with urinary obstruction and other lower urinary  tract symptoms (LUTS) 04/28/2004  . Hyponatremia 03/04/2011  . Hypothyroidism 03/04/2011  . Impotence of organic origin 10/03/2000  . Internal hemorrhoids without mention of complication 02/03/2011  . Long term (current) use of anticoagulants 03/17/2011  . Major depressive disorder, single episode, unspecified 03/05/2009  . Memory change 01/24/2013  . Muscle weakness (generalized) 03/10/2011  . Nocturia 10/27/2011  . Obstructive sleep apnea (adult) (pediatric) 07/23/2008  . Open wound of knee, leg (except thigh), and ankle, complicated 07/31/2014  . Osteoarthritis of both knees 07/31/2014  . Osteoarthrosis, unspecified whether generalized or localized, unspecified  site 02/05/2003  . PAF (paroxysmal atrial fibrillation) (HCC)    coumadin  . Pain in joint, lower leg 08/04/2011  . Pruritic condition 01/24/2013  . Quadriceps weakness 07/31/2014  . Right bundle branch block 04/19/2006  . 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 Spinal stenosis in cervical region 12/15/2004  . SSS (sick sinus syndrome) (HCC) 04/08/2014  . Stroke (HCC)   . Thyroid disease   . TIA (transient ischemic attack) 04/30/2013  . Type II or unspecified type diabetes mellitus without mention of complication, not stated as uncontrolled 11/24/2004  . Unspecified vitamin D deficiency 10/18/2006  . 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 Increased exposure of the right cornea.    Past Surgical History:  Procedure Laterality Date  . CORONARY ANGIOPLASTY WITH STENT PLACEMENT  2008   stent to SVG to OM  . CORONARY ARTERY BYPASS GRAFT  1989  . EYE SURGERY    . HERNIA REPAIR    . INSERT / REPLACE / REMOVE PACEMAKER    . JOINT REPLACEMENT    . PACEMAKER INSERTION  2011   Family History  Problem Relation Age of Onset  . Diabetes Father   . Other Mother        PAD with amputations   History  Sexual Activity  . Sexual activity: No    Outpatient Encounter Prescriptions as of 11/15/2016  Medication Sig  . allopurinol (ZYLOPRIM) 100 MG tablet Take 50 mg by mouth daily.   Marland Kitchen amoxicillin (AMOXIL) 500 MG capsule Take 500 mg by mouth. Take 4 caps prior to dental procedures  . apixaban (ELIQUIS) 2.5 MG TABS tablet Take 1 tablet (2.5 mg total) by mouth 2 (two) times daily.  Marland Kitchen aspirin EC 81 MG tablet Take 81 mg by mouth daily.  . carboxymethylcellulose (REFRESH PLUS) 0.5 % SOLN Place 1 drop into both eyes 3 (three) times daily. May also do daily as needed  . Cholecalciferol 1000 units tablet Take 2,000 Units by mouth daily.  Marland Kitchen Dextran 70-Hypromellose 0.1-0.3 % SOLN Apply 1 drop to eye 2 (two) times daily as needed.  .  Furosemide (LASIX PO) Take 60 mg by mouth daily.  . furosemide (LASIX) 20 MG tablet Take 20 mg by mouth as needed. If patient has gained 3 lbs in 24 hours or 5 lbs in a week  . ipratropium (ATROVENT) 0.06 % nasal spray Place 1 spray into both nostrils 3 (three) times daily as needed.   Marland Kitchen ipratropium-albuterol (DUONEB) 0.5-2.5 (3) MG/3ML SOLN Take 3 mLs by nebulization 4 (four) times daily as needed.  Marland Kitchen levothyroxine (SYNTHROID, LEVOTHROID) 125 MCG tablet Take 125 mcg by mouth daily before breakfast.  . LORazepam (ATIVAN) 0.5 MG tablet Take 0.25 mg by mouth every 4 (four) hours as needed.   Marland Kitchen losartan (COZAAR) 25 MG tablet Take 1 tablet (25 mg total) by  mouth daily.  . meclizine (ANTIVERT) 25 MG tablet Take 25 mg by mouth every 6 (six) hours as needed.   . Menthol, Topical Analgesic, (BIOFREEZE) 4 % GEL Apply topically. Apply to left knee four times daily as needed for pain  . metoprolol tartrate (LOPRESSOR) 25 MG tablet Take 1 tablet (25 mg total) by mouth 2 (two) times daily.  . mupirocin nasal ointment (BACTROBAN) 2 % Place 1 application into the nose 2 (two) times daily as needed.  Marland Kitchen Neomycin-Bacitracin-Polymyxin (TRIPLE ANTIBIOTIC) [phone] OINT Apply 1 application topically daily as needed (apply to bilateral nostrils).  . nitroGLYCERIN (NITROSTAT) 0.4 MG SL tablet Place 0.4 mg under the tongue every 5 (five) minutes as needed for chest pain.   Marland Kitchen olopatadine (PATANOL) 0.1 % ophthalmic solution Place 1 drop into both eyes 2 (two) times daily as needed for allergies.  . OXYGEN Inhale 2 L into the lungs continuous.  . polyethylene glycol (MIRALAX / GLYCOLAX) packet Take 17 g by mouth daily as needed.   . polyvinyl alcohol (LIQUIFILM TEARS) 1.4 % ophthalmic solution Place 1 drop into both eyes as needed for dry eyes.  . RESOURCE BENEPROTEIN PO Take 15 mLs by mouth daily as needed.  . tamsulosin (FLOMAX) 0.4 MG CAPS capsule Take one cap by mouth daily  . tobramycin-dexamethasone (TOBRADEX)  ophthalmic ointment Place 1 application into both eyes daily as needed. Can also use daily at bedtime  . triamcinolone cream (KENALOG) 0.1 % Apply 1 application topically daily.  Marland Kitchen UNABLE TO FIND Med Name: Hydrocortisone cream with applicator 1%. Apply to hemorrhoids 2 times daily as needed  . [DISCONTINUED] furosemide (LASIX) 40 MG tablet Take 40 mg by mouth daily.    No facility-administered encounter medications on file as of 11/15/2016.     Activities of Daily Living In your present state of health, do you have any difficulty performing the following activities: 11/15/2016  Hearing? N  Vision? N  Difficulty concentrating or making decisions? N  Walking or climbing stairs? Y  Dressing or bathing? Y  Doing errands, shopping? Y  Preparing Food and eating ? Y  Using the Toilet? N  In the past six months, have you accidently leaked urine? Y  Do you have problems with loss of bowel control? N  Managing your Medications? Y  Managing your Finances? Y  Housekeeping or managing your Housekeeping? Y  Some recent data might be hidden    Patient Care Team: Blanchie Serve, MD as PCP - General (Internal Medicine) [place], [facility], Nadean Corwin, MD as Consulting Physician (Cardiology) Ardis Hughs, MD as Attending Physician (Urology) Mast, Man X, NP as Nurse Practitioner (Internal Medicine)   Assessment:     Exercise Activities and Dietary recommendations Current Exercise Habits: The patient does not participate in regular exercise at present, Exercise limited by: orthopedic condition(s)  Goals    None     Fall Risk Fall Risk  11/15/2016 10/15/2015 06/11/2015 10/23/2014 07/07/2014  Falls in the past year? No No Yes Yes Yes  Number falls in past yr: - - '1 1 1  '$ Injury with Fall? - - Yes - No  Risk for fall due to : - - - - History of fall(s);Impaired mobility;Impaired balance/gait  Follow up - - - - Falls evaluation completed;Falls prevention discussed   Depression  Screen PHQ 2/9 Scores 11/15/2016 06/11/2015 07/07/2014 02/17/2014  PHQ - 2 Score 0 0 0 0    Cognitive Function MMSE - Mini Mental State Exam 11/15/2016  Orientation to time 5  Orientation to Place 5  Registration 3  Attention/ Calculation 5  Recall 3  Language- name 2 objects 2  Language- repeat 1  Language- follow 3 step command 3  Language- read & follow direction 1  Write a sentence 1  Copy design 1  Total score 30        Immunization History  Administered Date(s) Administered  . Influenza Whole 12/06/2011, 12/19/2012  . Influenza-Unspecified 01/09/2014, 11/25/2014, 12/23/2015  . PPD Test 05/03/2013  . Pneumococcal Polysaccharide-23 03/07/1986  . Td 04/20/2005   Screening Tests Health Maintenance  Topic Date Due  . OPHTHALMOLOGY EXAM  01/12/1932  . PNA vac Low Risk Adult (1 of 2 - PCV13) 01/12/1989  . TETANUS/TDAP  04/21/2015  . FOOT EXAM  07/31/2015  . HEMOGLOBIN A1C  06/30/2016  . INFLUENZA VACCINE  10/05/2016      Plan:    I have personally reviewed and addressed the Medicare Annual Wellness questionnaire and have noted the following in the patient's chart:  A. Medical and social history B. Use of alcohol, tobacco or illicit drugs  C. Current medications and supplements D. Functional ability and status E.  Nutritional status F.  Physical activity G. Advance directives H. List of other physicians I.  Hospitalizations, surgeries, and ER visits in previous 12 months J.  [place] to include hearing, vision, cognitive, depression L. Referrals and appointments - none  In addition, I have reviewed and discussed with patient certain preventive protocols, quality metrics, and best practice recommendations. A written personalized care plan for preventive services as well as general preventive health recommendations were provided to patient.  See attached scanned questionnaire for additional information.   Signed,   Rich Reining, RN Nurse Health  Advisor   Quick Notes   Health Maintenance: TDAP, prevnar, eye exam, foot exam, hga1c due. Pt will get flu vaccine when facility gets them in stock.     Abnormal Screen: MMSE 30/30 Passed clock drawings     Patient Concerns: None     Nurse Concerns: None

## 2016-11-16 ENCOUNTER — Non-Acute Institutional Stay: Payer: Medicare Other | Admitting: Nurse Practitioner

## 2016-11-16 ENCOUNTER — Encounter: Payer: Self-pay | Admitting: Nurse Practitioner

## 2016-11-16 DIAGNOSIS — I4891 Unspecified atrial fibrillation: Secondary | ICD-10-CM

## 2016-11-16 DIAGNOSIS — N401 Enlarged prostate with lower urinary tract symptoms: Secondary | ICD-10-CM

## 2016-11-16 DIAGNOSIS — I5022 Chronic systolic (congestive) heart failure: Secondary | ICD-10-CM | POA: Diagnosis not present

## 2016-11-16 NOTE — Assessment & Plan Note (Signed)
Pace maker, heart rate is in control, continue Metoprolol 25mg  bid

## 2016-11-16 NOTE — Assessment & Plan Note (Signed)
Improved  SOB, sputum production, cough, edema in legs, moist rales since Furosemide 60mg  po qam 11/01/16. Spoke with the patient and his POA daughter Jon [name]: risk vs benefit of Furosemide, agreed to continue Furosemide 60mg  po daily for now.

## 2016-11-16 NOTE — Assessment & Plan Note (Signed)
urinary frequency at night, continue Tamsulosin 0.4mg 

## 2016-11-16 NOTE — Progress Notes (Signed)
This encounter was created in error - please disregard.

## 2016-11-16 NOTE — Progress Notes (Signed)
Location:  [place] Room Number: [place] of Service:  [phone]) Provider:  Dusty Raczkowski, [place]  NP  Blanchie Serve, MD  Patient Care Team: Blanchie Serve, MD as PCP - General (Internal Medicine) [place], Nadean Corwin, MD as Consulting Physician (Cardiology) Ardis Hughs, MD as Attending Physician (Urology) Aleister Lady X, NP as Nurse Practitioner (Internal Medicine)  Extended Emergency Contact Information Primary Emergency Contact: [name] of [place] Phone: [phone] Relation: Daughter Secondary Emergency Contact: Eyvonne Mechanic [place] of [place] Phone: [phone] Mobile Phone: [phone] Relation: Daughter  Code Status:  DNR Goals of care: Advanced Directive information Advanced Directives 11/16/2016  Does Patient Have a Medical Advance Directive? Yes  Type of Advance Directive Out of facility DNR (pink MOST or yellow form);[place];Living will  Does patient want to make changes to medical advance directive? No - Patient declined  Copy of [place] in Chart? Yes  Pre-existing out of facility DNR order (yellow form or pink MOST form) Yellow form placed in chart (order not valid for inpatient use)     Chief Complaint  Patient presents with  . Acute Visit    medication changes    HPI:  Pt is a 83 y.o. male seen today for an acute visit for the patient's daughter, Shonna Chock, [place] called with a concern regarding current Furosemide '60mg'$  po daily, she stated the patient has to urinate frequently which bothering him, hard on him. Reviewed med order from 11/01/16, POA is aware of indication for CHF. Improved  SOB, sputum production, cough, edema in legs, moist rales since Furosemide '60mg'$  po qam 11/01/16. The patient denied dysuria, supra pubic or CVA tenderness, nausea, vomiting, he is afebrile.   Hx of BPH, taking Tamsulosin 0.'4mg'$  qd, baseline  urinary frequency and leakage. Afib, heart rate is in control, taking Metoprolol.     Past Medical History:  Diagnosis Date  . Abnormal PFTs 10/30/2012   Followed in Pulmonary clinic/ [facility]/ [place]  - PFT's 10/22/12 VC  55% no obst, DLCO 50%  - PFT's 12/20/2012 VC 83% and no obst, dLCO 55%  -11/08/2012  Walked RA x 3 laps @ 185 ft each stopped due to  End of study, not desat -11/08/12 esr 10    . Abnormality of gait 04/18/2013  . Acute on chronic systolic congestive heart failure, NYHA class 2 ([place]) 03/16/2006   BNP 376.9 05/28/13   . Adjustment disorder with mixed anxiety and depressed mood 04/18/2013   Post CVA depression and anxiety   . Allergy   . Anxiety   . Aortic aneurysm of unspecified site without mention of rupture 10/27/2011  . Arthritis   . Atherosclerosis of renal artery ([place]) 10/18/2006  . Atrial fibrillation ([place]) 03/04/2011    He was on coumadin until his stroke last December and was switched to apixaban 2.'5mg'$  daily which he has been taking faithfully without reported side effects.    . Balance disorder 07/31/2014  . Basal cell carcinoma of skin of other and unspecified parts of face 12/24/2009  . Blood transfusion without reported diagnosis   . Cardiac pacemaker in situ- MDT 10/11 03/18/2010   Medtronic revo implant in October 2011. Severe sinus bradycardia in the 30s, but not truly pacemaker dependent   . Cataract   . CKD stage 2 due to type 2 diabetes mellitus ([place]) 07/03/2014  . Coronary artery disease   . CVA - Rt brain stroke 12/14  02/18/2013   02/19/13 angiography CT head:  Diffuse atherosclerotic irregularity and plaque formation of the distal right common carotid artery and proximal internal carotid artery without significant stenosis. Plaque ulceration is present and is a source of emboli. A small right thalamic CVA    . DM (diabetes mellitus), type 2 with renal complications ([place]) 0/11/3816  . Dyslipidemia 11/03/2004  . Fall 03/04/2011  . Fatigue 04/18/2013  .  Gout, unspecified 11/03/2004  . Heart murmur   . History of abdominal aortic aneurysm   . History of Doppler ultrasound 05/22/2012   LEAs; R anterior tibial artery appeared occluded; L posterior tibial shows short segment of occlusive ds  . History of Doppler ultrasound 05/22/2012   Abdominal Aortic Doppler; slight increase in fusiform aneurysm   . History of echocardiogram 12/2008   EF >55%; mild concentric LVH; mild MR; mild-mod TR; mild AV regurg;   . History of nuclear stress test 12/2010   lexiscan; low risk; compared to prior study, perfusion improved  . HTN (hypertension) 03/04/2011  . Hyperlipidemia   . Hypertension   . Hypertrophy of prostate with urinary obstruction and other lower urinary tract symptoms (LUTS) 04/28/2004  . Hyponatremia 03/04/2011  . Hypothyroidism 03/04/2011  . Impotence of organic origin 10/03/2000  . Internal hemorrhoids without mention of complication 29/93/7169  . Long term (current) use of anticoagulants 03/17/2011  . Major depressive disorder, single episode, unspecified 03/05/2009  . Memory change 01/24/2013  . Muscle weakness (generalized) 03/10/2011  . Nocturia 10/27/2011  . Obstructive sleep apnea (adult) (pediatric) 07/23/2008  . Open wound of knee, leg (except thigh), and ankle, complicated 6/78/9381  . Osteoarthritis of both knees 07/31/2014  . Osteoarthrosis, unspecified whether generalized or localized, unspecified site 02/05/2003  . PAF (paroxysmal atrial fibrillation) (HCC)    coumadin  . Pain in joint, lower leg 08/04/2011  . Pruritic condition 01/24/2013  . Quadriceps weakness 07/31/2014  . Right bundle branch block 04/19/2006  . 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 Spinal stenosis in cervical region 12/15/2004  . SSS (sick sinus syndrome) ([place]) 04/08/2014  . Stroke ([place])   . Thyroid disease   . TIA (transient ischemic attack) 04/30/2013  . Type II or unspecified type diabetes mellitus without  mention of complication, not stated as uncontrolled 11/24/2004  . Unspecified vitamin D deficiency 10/18/2006  . 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 Increased exposure of the right cornea.    Past Surgical History:  Procedure Laterality Date  . CORONARY ANGIOPLASTY WITH STENT PLACEMENT  2008   stent to SVG to OM  . CORONARY ARTERY BYPASS GRAFT  1989  . EYE SURGERY    . HERNIA REPAIR    . INSERT / REPLACE / REMOVE PACEMAKER    . JOINT REPLACEMENT    . PACEMAKER INSERTION  2011    Allergies  Allergen Reactions  . Quinolones Other (See Comments)    Risk of rupture of AAA  . Caduet [Amlodipine-Atorvastatin] Other (See Comments)    Weakness   . Crestor [Rosuvastatin] Other (See Comments)    Muscle pain/weakness  . Lipitor [Atorvastatin Calcium] Other (See Comments)    Muscle pain/weakness  . Simvastatin Other (See Comments)    Muscle pain/weakness  . Lisinopril Other (See Comments)    Doesn't remember reaction    Outpatient Encounter Prescriptions as of 11/16/2016  Medication Sig  . allopurinol (ZYLOPRIM) 100 MG tablet Take 50 mg by mouth  daily.   . amoxicillin (AMOXIL) 500 MG capsule Take 500 mg by mouth. Take 4 caps prior to dental procedures  . apixaban (ELIQUIS) 2.5 MG TABS tablet Take 1 tablet (2.5 mg total) by mouth 2 (two) times daily.  Marland Kitchen aspirin EC 81 MG tablet Take 81 mg by mouth daily.  . carboxymethylcellulose (REFRESH PLUS) 0.5 % SOLN Place 1 drop into both eyes 3 (three) times daily. May also do daily as needed  . Cholecalciferol 1000 units tablet Take 2,000 Units by mouth daily.  Marland Kitchen Dextran 70-Hypromellose 0.1-0.3 % SOLN Apply 1 drop to eye 2 (two) times daily as needed.  . furosemide (LASIX) 20 MG tablet Take 20 mg by mouth as needed. If patient has gained 3 lbs in 24 hours or 5 lbs in a week  . ipratropium (ATROVENT) 0.06 % nasal spray Place 1 spray into both nostrils 3 (three) times daily as needed.   Marland Kitchen ipratropium-albuterol (DUONEB)  0.5-2.5 (3) MG/3ML SOLN Take 3 mLs by nebulization 4 (four) times daily as needed.  Marland Kitchen levothyroxine (SYNTHROID, LEVOTHROID) 125 MCG tablet Take 125 mcg by mouth daily before breakfast.  . LORazepam (ATIVAN) 0.5 MG tablet Take 0.25 mg by mouth every 4 (four) hours as needed.   Marland Kitchen losartan (COZAAR) 25 MG tablet Take 1 tablet (25 mg total) by mouth daily.  . meclizine (ANTIVERT) 25 MG tablet Take 25 mg by mouth every 6 (six) hours as needed.   . Menthol, Topical Analgesic, (BIOFREEZE) 4 % GEL Apply topically. Apply to left knee four times daily as needed for pain  . metoprolol tartrate (LOPRESSOR) 25 MG tablet Take 1 tablet (25 mg total) by mouth 2 (two) times daily.  . mupirocin nasal ointment (BACTROBAN) 2 % Place 1 application into the nose 2 (two) times daily as needed.  Marland Kitchen Neomycin-Bacitracin-Polymyxin (TRIPLE ANTIBIOTIC) [phone] OINT Apply 1 application topically daily as needed (apply to bilateral nostrils).  . nitroGLYCERIN (NITROSTAT) 0.4 MG SL tablet Place 0.4 mg under the tongue every 5 (five) minutes as needed for chest pain.   Marland Kitchen olopatadine (PATANOL) 0.1 % ophthalmic solution Place 1 drop into both eyes 2 (two) times daily as needed for allergies.  . OXYGEN Inhale 2 L into the lungs continuous.  . polyethylene glycol (MIRALAX / GLYCOLAX) packet Take 17 g by mouth daily as needed.   . polyvinyl alcohol (LIQUIFILM TEARS) 1.4 % ophthalmic solution Place 1 drop into both eyes as needed for dry eyes.  . RESOURCE BENEPROTEIN PO Take 15 mLs by mouth daily as needed.  . tamsulosin (FLOMAX) 0.4 MG CAPS capsule Take one cap by mouth daily  . tobramycin-dexamethasone (TOBRADEX) ophthalmic ointment Place 1 application into both eyes daily as needed. Can also use daily at bedtime  . triamcinolone cream (KENALOG) 0.1 % Apply 1 application topically daily.  Marland Kitchen UNABLE TO FIND Med Name: Hydrocortisone cream with applicator 1%. Apply to hemorrhoids 2 times daily as needed  . [DISCONTINUED] Furosemide  (LASIX PO) Take 60 mg by mouth daily.   No facility-administered encounter medications on file as of 11/16/2016.     Review of Systems  Constitutional: Negative for activity change, appetite change, chills, diaphoresis, fatigue and fever.  HENT: Negative for congestion.   Respiratory: Negative for cough, choking, chest tightness, shortness of breath and wheezing.   Cardiovascular: Negative for chest pain and palpitations.  Gastrointestinal: Negative for abdominal distention, abdominal pain, constipation, diarrhea, nausea and vomiting.  Genitourinary: Positive for frequency. Negative for difficulty urinating, dysuria, flank pain, hematuria  and urgency.  Skin:       Ingrowing toe nails R+L great toes, trimmed.     Immunization History  Administered Date(s) Administered  . Influenza Whole 12/06/2011, 12/19/2012  . Influenza-Unspecified 01/09/2014, 11/25/2014, 12/23/2015  . PPD Test 05/03/2013  . Pneumococcal Polysaccharide-23 03/07/1986  . Td 04/20/2005   Pertinent  Health Maintenance Due  Topic Date Due  . OPHTHALMOLOGY EXAM  01/12/1932  . PNA vac Low Risk Adult (1 of 2 - PCV13) 01/12/1989  . FOOT EXAM  07/31/2015  . HEMOGLOBIN A1C  06/30/2016  . INFLUENZA VACCINE  10/05/2016   Fall Risk  11/15/2016 10/15/2015 06/11/2015 10/23/2014 07/07/2014  Falls in the past year? No No Yes Yes Yes  Number falls in past yr: - - '1 1 1  '$ Injury with Fall? - - Yes - No  Risk for fall due to : - - - - History of fall(s);Impaired mobility;Impaired balance/gait  Follow up - - - - Falls evaluation completed;Falls prevention discussed   Functional Status Survey:    Vitals:   11/16/16 1128  BP: 130/60  Pulse: 68  Resp: 18  Temp: 98.7 F (37.1 C)  SpO2: 96%  Weight: 159 lb 3.2 oz (72.2 kg)  Height: '5\' 7"'$  (1.702 m)   Body mass index is 24.93 kg/m. Physical Exam  Constitutional: He is oriented to person, place, and time. He appears well-developed and well-nourished. No distress.  HENT:  Head:  Normocephalic and atraumatic.  Eyes: Pupils are equal, round, and reactive to light. Conjunctivae and EOM are normal.  Neck: Normal range of motion. Neck supple. No JVD present.  Cardiovascular: Normal rate, regular rhythm and intact distal pulses.   Murmur heard. Pulmonary/Chest: Effort normal and breath sounds normal. He has no wheezes. He has no rales.  Abdominal: Soft. Bowel sounds are normal. He exhibits no distension. There is no tenderness. There is no rebound and no guarding.  Neurological: He is alert and oriented to person, place, and time.  Skin: Skin is warm and dry. He is not diaphoretic.    Labs reviewed:  Recent Labs  09/29/16 10/11/16 11/03/16  NA 133*  133* 134* 135*  K 4.6  4.6 4.5 4.4  BUN 23*  23* 22* 23*  CREATININE 1.1  1.1 1.0 1.0    Recent Labs  05/05/16 06/15/16 09/29/16  AST '18 23 17  '$ ALT '14 16 11  '$ ALKPHOS 98 82 78    Recent Labs  05/05/16 06/15/16 11/03/16  WBC 6.4 7.3 6.1  HGB 16.2 16.2 15.5  HCT 48 48 46  PLT 195 217 229   Lab Results  Component Value Date   TSH 1.69 11/03/2016   Lab Results  Component Value Date   HGBA1C 6.3 12/31/2015   Lab Results  Component Value Date   CHOL 130 10/18/2016   HDL 32 (A) 10/18/2016   LDLCALC 83 10/18/2016   TRIG 70 10/18/2016   CHOLHDL 3.9 05/01/2013    Significant Diagnostic Results in last 30 days:  No results found.  Assessment/Plan Chronic systolic congestive heart failure (HCC) Improved  SOB, sputum production, cough, edema in legs, moist rales since Furosemide '60mg'$  po qam 11/01/16. Spoke with the patient and his POA daughter Delphina Cahill: risk vs benefit of Furosemide, agreed to continue Furosemide '60mg'$  po daily for now.    Atrial fibrillation ([place]) Pace maker, heart rate is in control, continue Metoprolol '25mg'$  bid  Enlarged prostate with lower urinary tract symptoms (LUTS) urinary frequency at night, continue Tamsulosin 0.'4mg'$   Family/ staff Communication: plan of care  reviewed with the patient, HPOA daughter Anise Salvo, and charge nurse  Labs/tests ordered: none  Time spend 25 minutes.

## 2016-11-17 DIAGNOSIS — E118 Type 2 diabetes mellitus with unspecified complications: Secondary | ICD-10-CM | POA: Diagnosis not present

## 2016-11-17 DIAGNOSIS — Z794 Long term (current) use of insulin: Secondary | ICD-10-CM | POA: Diagnosis not present

## 2016-11-17 DIAGNOSIS — I10 Essential (primary) hypertension: Secondary | ICD-10-CM | POA: Diagnosis not present

## 2016-11-17 LAB — HEMOGLOBIN A1C: Hemoglobin A1C: 6.1

## 2016-11-18 ENCOUNTER — Telehealth: Payer: Self-pay | Admitting: Internal Medicine

## 2016-11-18 NOTE — Telephone Encounter (Signed)
°  New Prob   Has some questions regarding orders Dr. Debara Pickett recently placed for this patient. Please call to clarify.

## 2016-11-18 NOTE — Telephone Encounter (Signed)
Daughter wanted to know if there was any further testing need  From  last office visit with Dr Debara Pickett.  per office notes patient was scheduled to have an oxygen oximetry test.  Not sure if test has been schedule.  Daughter states patient  has an appointment with dr dohmeier  - sleep consult 12/13/16  Informed patient that is an [facility] visit [facility]   she verbalized understanding.

## 2016-11-21 ENCOUNTER — Other Ambulatory Visit: Payer: Self-pay | Admitting: Nurse Practitioner

## 2016-11-21 NOTE — Telephone Encounter (Signed)
Per telephone note 11/15/16:  Note    Contacted nurse at [facility] and they do not have a certain home care agency used for testing for assisted living residents.   [place] [facility]. He reviewed patient's chart and with patient's Medicare and having dx of OSA, he would need an in lab sleep study with CPAP titration OR an office visit documenting that he is having SOB from a heart failure standpoint and document methods of treatment patient is currently on and if any are failed and would need to have an ambulating O2 test.  Will routed to MD to determine best course of action

## 2016-11-21 NOTE — Telephone Encounter (Signed)
Not sure if I responded - lets try and get a formal sleep study. He meets criteria for this.  Dr. Lemmie Evens

## 2016-11-22 LAB — CUP PACEART REMOTE DEVICE CHECK
Battery Voltage: 2.9 V
Brady Statistic RV Percent Paced: 55.2 %
Implantable Lead Implant Date: 20111021
Implantable Lead Location: 753860
Lead Channel Setting Pacing Amplitude: 2.5 V
MDC IDC LEAD IMPLANT DT: 20111021
MDC IDC LEAD LOCATION: 753859
MDC IDC MSMT LEADCHNL RA IMPEDANCE VALUE: 424 Ohm
MDC IDC MSMT LEADCHNL RV IMPEDANCE VALUE: 440 Ohm
MDC IDC PG IMPLANT DT: 20111021
MDC IDC SESS DTM: 20180918125118
MDC IDC SET LEADCHNL RV PACING PULSEWIDTH: 0.4 ms
MDC IDC SET LEADCHNL RV SENSING SENSITIVITY: 0.9 mV

## 2016-11-22 NOTE — Telephone Encounter (Signed)
Followed up with patient's daughter Izora Gala who called in on 9/14 (see phone note) regarding ONO/sleep study. Informed her of info per Arby Barrette and MD recommendations. She states patient has appt with Dr. Brett Fairy (sleep consult per Dr. Bubba Camp). Notified her that this appt is to discuss doing a formal sleep study. She states she does not think her dad will want to do this. Notified her that I will call Mr. Gulino and that I recall from his last visit with MD that he was pretty clear on NOT wanting a formal sleep study.   LM for patient to call back to discuss options/plan

## 2016-12-08 DIAGNOSIS — I10 Essential (primary) hypertension: Secondary | ICD-10-CM | POA: Diagnosis not present

## 2016-12-08 LAB — BASIC METABOLIC PANEL
BUN: 22 — AB (ref 4–21)
CREATININE: 1 (ref 0.6–1.3)
GLUCOSE: 122
Potassium: 4.4 (ref 3.4–5.3)
Sodium: 134 — AB (ref 137–147)

## 2016-12-09 NOTE — Telephone Encounter (Signed)
Patient aware info noted below. He voiced understanding. He has sleep consult with [facility] next week.

## 2016-12-12 ENCOUNTER — Encounter: Payer: Self-pay | Admitting: Neurology

## 2016-12-13 ENCOUNTER — Encounter: Payer: Self-pay | Admitting: Neurology

## 2016-12-13 ENCOUNTER — Ambulatory Visit (INDEPENDENT_AMBULATORY_CARE_PROVIDER_SITE_OTHER): Payer: Medicare Other | Admitting: Neurology

## 2016-12-13 VITALS — BP 119/61 | HR 67 | Ht 68.0 in | Wt 158.0 lb

## 2016-12-13 DIAGNOSIS — Z950 Presence of cardiac pacemaker: Secondary | ICD-10-CM

## 2016-12-13 DIAGNOSIS — R0683 Snoring: Secondary | ICD-10-CM | POA: Diagnosis not present

## 2016-12-13 DIAGNOSIS — I6340 Cerebral infarction due to embolism of unspecified cerebral artery: Secondary | ICD-10-CM | POA: Diagnosis not present

## 2016-12-13 DIAGNOSIS — I481 Persistent atrial fibrillation: Secondary | ICD-10-CM

## 2016-12-13 DIAGNOSIS — I4819 Other persistent atrial fibrillation: Secondary | ICD-10-CM

## 2016-12-13 NOTE — Patient Instructions (Signed)

## 2016-12-13 NOTE — Progress Notes (Signed)
NAME: Jon [name] DOB: [birth year]   Dr. Brett Fairy, MD   REASON FOR VISIT: PSG  HISTORY FROM: pt and daughter and chart  Today 12-13-2016  we had the pleasure of seeing Jon [name] in sleep consultation at our [facility]. Pt was accompanied by daughter. Jon [name] has been followed yearly by Dr. Erlinda Hong, his stroke specialist neurologist, will now requested a sleep study. Dr. Guinevere Ferrari is following the patient as primary physician.  Important for reevaluation of her possible sleep apnea condition is his history of transient ischemic attacks, cerebral infarction, as well as nocturia, arthrosclerotic heart disease of the native coronary artery, systolic congestive heart failure, chronic, paroxysmal atrial fibrillation, hypothyroidism and essential hypertension. These conditions affect the patient's ability to get restful sleep and sustain sleep. The patient was diagnosed with OSA over 15 years ago and was placed on CPAP- he used the device for many years and after he lapsed CPAP compliance for a while noted no difference. He stopped using it. He further reports that his in lab study was not reflecting his usual sleep.  His daughter reports that she remembers her father snoring but she was young, but recently has noted him to seemingly an active dreams during a nap that he took in a recliner. The patient feels that this is not reflecting his nocturnal sleep as he wakes up in the same position as he went to bed.  He is daytime sleepier. He dozes off frequently. He feel sleepy or after a meal, which is expected. He is not sure how often he takes a daytime nap.  The patient usually goes to bed around 10 PM, the bedroom is cool and quiet and dark. He falls asleep without problems and stays asleep. He now has only one time nocturia on average, he rises early at about 5 AM and wakes up spontaneously. This actually the staff as his residence that wakes him up at 5 AM to check on his oxygen  levels. This has begun to be has rising time. He does not feel that he has a lot of vivid dreams or dream enactment, occasionally he will wake up with a dry mouth but usually not with headaches, palpitations, nausea or dizziness.  he has very dry eyes at baseline , has right sided facial droop.      History Summary per Dr Erlinda Hong.  Jon [name] is a 84 y.o. right handed white male with a PMH significant for HTN, HLD, CAD s/p CABG, paroxysmal afib s/p pacemaker, s/p right CEA, recurrent ischemic strokes and AAA followed up in clinic for stroke. He had stroke in Dec 2014 with dysarthria and right face droop. Prior to the stroke, pt was on coumadin, and INR was 2.02 at that time. Nonetheless, he was switched to eliquis 2.5mg  bid for stroke prevention.   In 04/2013, pt was again admitted due to acute onset RLE weakness. He said that he lost control of that leg for approximately 30 minutes. Denies weakness of the right arm, HA, vertigo, double vision, difficulty swallowing, confusion, or visual disturbances. He talked to his daughters and was advised to come to the ED. He stated that has been taking faithfully without reported side effects. CT brain upon arrival to the ED showed findings consistent with an area of subacute to chronic lacunar infarction left MCA distribution and a subsequent MRI-DWI demonstrated: " small acute/subacute infarct left corona radiata. No mass effect or hemorrhage. 2. Three superimposed punctate acute lacunar infarcts (right  caudate  nucleus, left occipital pole, left left parietal lobe). These could reflect synchronous small vessel ischemia rather than a recent embolic event". MRA brain showed intracranial atherosclerosis and fusiform enlargement of the cavernous left ICA up to 9 mm diameter as well as fusiform enlargement of the right ICA terminus, up to 6 mm diameter. Total cholesterol 117, triglycerides 79, HDL 30, LDL 71. Eliquis was increased to 5 mg twice daily. Metoprolol was  increased from 50 mg twice a day to 75 mg twice a day for better blood pressure control. Patient had resolution of his symptoms. He was discharged back to ALF.  10/13/14 follow up - Pt has been doing the same. He still lives in nursing home, walk with walker. He had a fall several weeks ago due to imbalance with quick turns. Did not get any injury. He supposed to take lasix twice a day for CHF, however, he has frequent urination after lasix, so he only takes once a day now. He will see urologist this Friday. On flomax now. He will also see PCP in two weeks. His BP today 99/57. Did not check BP daily in NH.  Interval HistoryDuring the interval time, patient has been doing well, no recurrent stroke like symptoms. On benefit is 2.5 twice a day, without side effect. Blood pressure again low at 86/49, asymptomatic, still on Lasix, Cozaar, metoprolol, spironolactone, and followed with cardiology. Patient stated that his blood pressure in ALF is always 120s in the morning before the medication. He takes most BP medication once together in the morning. I encouraged him to check BP before medication and 2 hours after and compare and space out the BP meds if needed. He has appointment with Dr. Debara Pickett on 11/03/2015.   REVIEW OF SYSTEMS: Full 14 system review of systems performed and notable only for those listed below and in HPI above, all others are negative:  Constitutional:   Cardiovascular:   Ear/Nose/Throat: running nose  Skin: itching  Eyes: eye redness  Respiratory:   Gastroitestinal:  Genitourinary: Bladder incontinence, frequent urination, urgency Hematology/Lymphatic:   Endocrine: e  Musculoskeletal: walking difficulty, joint pain  Allergy/Immunology:   Neurological: Speech difficulty Psychiatric:  Sleep: daytime sleepiness  The following represents the patient's updated allergies and side effects list: Allergies  Allergen Reactions  . Quinolones Other (See Comments)    Risk of rupture of AAA    . Caduet [Amlodipine-Atorvastatin] Other (See Comments)    Weakness   . Crestor [Rosuvastatin] Other (See Comments)    Muscle pain/weakness  . Lipitor [Atorvastatin Calcium] Other (See Comments)    Muscle pain/weakness  . Simvastatin Other (See Comments)    Muscle pain/weakness  . Lisinopril Other (See Comments)    Doesn't remember reaction    Labs since last visit of relevance include the following: Results for orders placed or performed in visit on 63/78/58  Basic metabolic panel  Result Value Ref Range   Glucose 122    BUN 22 (A) 4 - 21   Creatinine 1.0 0.6 - 1.3   Potassium 4.4 3.4 - 5.3   Sodium 134 (A) 137 - 147    The neurologically relevant items on the patient's problem list were reviewed on today's visit.  Neurologic Examination  A problem focused neurological exam (12 or more points of the single system neurologic examination, vital signs counts as 1 point, cranial nerves count for 8 points) was performed.  Blood pressure 119/61, pulse 67, height 5\' 8"  (1.727 m), weight 158 lb (71.7 kg).  General - Well nourished, well developed, in no apparent distress.  Neck circumference is17 inches, Mallampati 2 - wide open. No uvula seen.  Neck scar from CEA surgery.   Ophthalmologic - not able to see throught. Right eye conjunctival injection.  Cardiovascular - irregularly irregular heart rate and rhythm.  Mental Status -  Level of arousal and orientation to time, place, and person were intact. Language including expression, naming, repetition, comprehension, reading was assessed and found intact. Mild dysarthria.  Cranial Nerves II - XII - Smell and taste diminished. Loss of smell.  II - Vision intact OU. III, IV, VI - Extraocular movements intact. V - Facial sensation intact bilaterally. VII - Facial movement intact bilaterally. VIII - Hearing & vestibular intact bilaterally. X - Palate elevates symmetrically, mild dysarthria. XI - Chin turning & shoulder shrug  intact bilaterally. XII - Tongue protrusion intact.  Motor Strength - The patient's strength was 4+/5 in all extremities and pronator drift was absent. Bulk was normal and fasciculations were absent.   Motor Tone - Muscle tone was assessed at the neck and appendages and was normal. Reflexes - The patient's reflexes were 1+ in all extremities and he had no pathological reflexes. No rigidity. Sensory - Light touch, temperature/pinprick were assessed and were normal.    Coordination - The patient had normal movements in the hands , finger to nose, with no ataxia or dysmetria.  Tremor was absent. Gait and Station - walks with walker, slow but steady. Stooped posture.   Data reviewed: I personally reviewed the images and agree with the radiology interpretations.     Assessment:   Jon [name] would by 5 prefer to be tested at home, and objects to an inpatient sleep study. I do think he has a moderate risk of having sleep apnea and if his apnea exceeds an AHI of 10 I would certainly want to treated with CPAP. However in order to grade the severity of apnea and the type of apnea he may have I would like to offer him the alternative of a home sleep test by watch procedure. Since he is certainly covered by Medicare we may have to fight for pre-approval of a home sleep test in his case. His sleepiness can be related to blood pressure medication, and beta blockers can certainly contribute.   As you may recall, he is a 83 y.o. Caucasian male with PMH significant for HTN, HLD, CAD s/p CABG, paroxysmal afib s/p pacemaker, s/p right CEA, recurrent ischemic strokes and AAA followed up in clinic for stroke. He had first stroke in 02/2013, changed from coumadin to eliquis but on low dose. Had residue of slurry speech. In 04/2013 he had another stroke complaining of RLE weakness for 88min and resolved. MRI showed multiple punctate or small infarcts bilatearlly. He was put on eliquis 5mg  bid. However, he had epistaxis  and hematuria during the interval time. Cardiologist Dr. Debara Pickett changed him back to 2.5mg  bid. So far he is tolerating 2.5 bid. He has side effect profile with Lipitor, Crestor, simvastatin. LDL last check 71, not on statin now.  BP low but asymptomatic, encourage to check BP before medication and 2 hours after and compare and space out the BP meds if needed.  Plan:  HST by apnea-watch. Rv after test .    Jon Seat, MD  [facility] [address], [place], [place] [address] [phone]  No orders of the defined types were placed in this encounter.  No orders of  the defined types were placed in this encounter.   There are no Patient Instructions on file for this visit.

## 2016-12-14 ENCOUNTER — Ambulatory Visit: Payer: Medicare Other | Admitting: Internal Medicine

## 2017-01-02 ENCOUNTER — Ambulatory Visit (INDEPENDENT_AMBULATORY_CARE_PROVIDER_SITE_OTHER): Payer: Medicare Other | Admitting: Ophthalmology

## 2017-01-02 ENCOUNTER — Ambulatory Visit (INDEPENDENT_AMBULATORY_CARE_PROVIDER_SITE_OTHER): Payer: Medicare Other | Admitting: Neurology

## 2017-01-02 DIAGNOSIS — I4819 Other persistent atrial fibrillation: Secondary | ICD-10-CM

## 2017-01-02 DIAGNOSIS — Z950 Presence of cardiac pacemaker: Secondary | ICD-10-CM

## 2017-01-02 DIAGNOSIS — G4733 Obstructive sleep apnea (adult) (pediatric): Secondary | ICD-10-CM

## 2017-01-02 DIAGNOSIS — R0683 Snoring: Secondary | ICD-10-CM

## 2017-01-02 DIAGNOSIS — I63419 Cerebral infarction due to embolism of unspecified middle cerebral artery: Secondary | ICD-10-CM

## 2017-01-05 ENCOUNTER — Non-Acute Institutional Stay: Payer: Medicare Other | Admitting: Internal Medicine

## 2017-01-05 ENCOUNTER — Telehealth: Payer: Self-pay | Admitting: Neurology

## 2017-01-05 ENCOUNTER — Encounter: Payer: Self-pay | Admitting: Internal Medicine

## 2017-01-05 DIAGNOSIS — I4891 Unspecified atrial fibrillation: Secondary | ICD-10-CM | POA: Diagnosis not present

## 2017-01-05 DIAGNOSIS — R040 Epistaxis: Secondary | ICD-10-CM

## 2017-01-05 NOTE — Progress Notes (Signed)
Location:  [place] Room Number: [place] of Service:  ALF [phone]) Provider:  Blanchie Serve, MD  Blanchie Serve, MD  Patient Care Team: Blanchie Serve, MD as PCP - General (Internal Medicine) [place] Hilty, Nadean Corwin, MD as Consulting Physician (Cardiology) Ardis Hughs, MD as Attending Physician (Urology) Mast, Man X, NP as Nurse Practitioner (Internal Medicine)  Extended Emergency Contact Information Primary Emergency Contact: [name] of [place] Phone: [phone] Relation: Daughter Secondary Emergency Contact: Eyvonne Mechanic [place] of [place] Phone: [phone] Mobile Phone: [phone] Relation: Daughter  Code Status:  DNR  Goals of care: Advanced Directive information Advanced Directives 01/05/2017  Does Patient Have a Medical Advance Directive? Yes  Type of Paramedic of [place];Living will;Out of facility DNR (pink MOST or yellow form)  Does patient want to make changes to medical advance directive? No - Patient declined  Copy of [place] in Chart? Yes  Pre-existing out of facility DNR order (yellow form or pink MOST form) Yellow form placed in chart (order not valid for inpatient use)     Chief Complaint  Patient presents with  . Acute Visit    Nosebleed    HPI:  Pt is a 83 y.o. male seen today for an acute visit for nose bleed. He had a nose bleed this afternoon from left nostril. He sprayed afrin and pressed on his nose and bleeding stopped. He mentions he has had recurrent nose bleed- total of 8-9 in last 3 months. He has had problem with nose bleed since childhood but it has increased in frequency recently. He denies any feeling of mass in his nose. No trouble swallowing. No tricking of blood through back of his throat. No bleeding from elsewhere. On medication review, he is on aspirin and eliquis.    Past Medical History:    Diagnosis Date  . Abnormal PFTs 10/30/2012   Followed in Pulmonary clinic/ [facility]/ [place]  - PFT's 10/22/12 VC  55% no obst, DLCO 50%  - PFT's 12/20/2012 VC 83% and no obst, dLCO 55%  -11/08/2012  Walked RA x 3 laps @ 185 ft each stopped due to  End of study, not desat -11/08/12 esr 10    . Abnormality of gait 04/18/2013  . Acute on chronic systolic congestive heart failure, NYHA class 2 ([place]) 03/16/2006   BNP 376.9 05/28/13   . Adjustment disorder with mixed anxiety and depressed mood 04/18/2013   Post CVA depression and anxiety   . Allergy   . Anxiety   . Aortic aneurysm of unspecified site without mention of rupture 10/27/2011  . Arthritis   . Atherosclerosis of renal artery ([place]) 10/18/2006  . Atrial fibrillation ([place]) 03/04/2011    He was on coumadin until his stroke last December and was switched to apixaban 2.47m daily which he has been taking faithfully without reported side effects.    . Balance disorder 07/31/2014  . Basal cell carcinoma of skin of other and unspecified parts of face 12/24/2009  . Blood transfusion without reported diagnosis   . Cardiac pacemaker in situ- MDT 10/11 03/18/2010   Medtronic revo implant in October 2011. Severe sinus bradycardia in the 30s, but not truly pacemaker dependent   . Cataract   . CKD stage 2 due to type 2 diabetes mellitus ([place] 07/03/2014  . Coronary artery disease   . CVA - Rt brain stroke 12/14 02/18/2013   02/19/13 angiography CT head:  Diffuse atherosclerotic irregularity and plaque formation of the distal right common carotid artery and proximal internal carotid artery without significant stenosis. Plaque ulceration is present and is a source of emboli. A small right thalamic CVA    . DM (diabetes mellitus), type 2 with renal complications ([place]) 3/55/9741  . Dyslipidemia 11/03/2004  . Fall 03/04/2011  . Fatigue 04/18/2013  . Gout, unspecified 11/03/2004  . Heart murmur   . History of abdominal aortic aneurysm   . History of Doppler  ultrasound 05/22/2012   LEAs; R anterior tibial artery appeared occluded; L posterior tibial shows short segment of occlusive ds  . History of Doppler ultrasound 05/22/2012   Abdominal Aortic Doppler; slight increase in fusiform aneurysm   . History of echocardiogram 12/2008   EF >55%; mild concentric LVH; mild MR; mild-mod TR; mild AV regurg;   . History of nuclear stress test 12/2010   lexiscan; low risk; compared to prior study, perfusion improved  . HTN (hypertension) 03/04/2011  . Hyperlipidemia   . Hypertension   . Hypertrophy of prostate with urinary obstruction and other lower urinary tract symptoms (LUTS) 04/28/2004  . Hyponatremia 03/04/2011  . Hypothyroidism 03/04/2011  . Impotence of organic origin 10/03/2000  . Internal hemorrhoids without mention of complication 63/84/5364  . Long term (current) use of anticoagulants 03/17/2011  . Major depressive disorder, single episode, unspecified 03/05/2009  . Memory change 01/24/2013  . Muscle weakness (generalized) 03/10/2011  . Nocturia 10/27/2011  . Obstructive sleep apnea (adult) (pediatric) 07/23/2008  . Open wound of knee, leg (except thigh), and ankle, complicated 6/80/3212  . Osteoarthritis of both knees 07/31/2014  . Osteoarthrosis, unspecified whether generalized or localized, unspecified site 02/05/2003  . PAF (paroxysmal atrial fibrillation) (HCC)    coumadin  . Pain in joint, lower leg 08/04/2011  . Pruritic condition 01/24/2013  . Quadriceps weakness 07/31/2014  . Right bundle branch block 04/19/2006  . 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 Spinal stenosis in cervical region 12/15/2004  . SSS (sick sinus syndrome) ([place]) 04/08/2014  . Stroke ([place])   . Thyroid disease   . TIA (transient ischemic attack) 04/30/2013  . Type II or unspecified type diabetes mellitus without mention of complication, not stated as uncontrolled 11/24/2004  . Unspecified vitamin D deficiency 10/18/2006    . 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 Increased exposure of the right cornea.    Past Surgical History:  Procedure Laterality Date  . CORONARY ANGIOPLASTY WITH STENT PLACEMENT  2008   stent to SVG to OM  . CORONARY ARTERY BYPASS GRAFT  1989  . EYE SURGERY    . HERNIA REPAIR    . INSERT / REPLACE / REMOVE PACEMAKER    . JOINT REPLACEMENT    . PACEMAKER INSERTION  2011    Allergies  Allergen Reactions  . Quinolones Other (See Comments)    Risk of rupture of AAA  . Caduet [Amlodipine-Atorvastatin] Other (See Comments)    Weakness   . Crestor [Rosuvastatin] Other (See Comments)    Muscle pain/weakness  . Lipitor [Atorvastatin Calcium] Other (See Comments)    Muscle pain/weakness  . Simvastatin Other (See Comments)    Muscle pain/weakness  . Lisinopril Other (See Comments)    Doesn't remember reaction    Outpatient Encounter Prescriptions as of 01/05/2017  Medication Sig  . allopurinol (ZYLOPRIM) 100 MG tablet Take 50 mg by mouth daily.   Marland Kitchen amoxicillin (AMOXIL) 500  MG capsule Take 500 mg by mouth. Take 4 caps prior to dental procedures  . apixaban (ELIQUIS) 2.5 MG TABS tablet Take 1 tablet (2.5 mg total) by mouth 2 (two) times daily.  Marland Kitchen aspirin EC 81 MG tablet Take 81 mg by mouth daily.  . carboxymethylcellulose (REFRESH PLUS) 0.5 % SOLN Place 1 drop into both eyes 3 (three) times daily. May also do daily as needed  . Cholecalciferol 1000 units tablet Take 2,000 Units by mouth daily.  Marland Kitchen Dextran 70-Hypromellose 0.1-0.3 % SOLN Apply 1 drop to eye 2 (two) times daily as needed.  . furosemide (LASIX) 20 MG tablet Take 60 mg by mouth daily.   . furosemide (LASIX) 20 MG tablet Take 20 mg by mouth as needed (if weight gain >3lbs in 24hrs or >5lbs in a week).  Marland Kitchen ipratropium (ATROVENT) 0.06 % nasal spray Place 1 spray into both nostrils 3 (three) times daily as needed.   Marland Kitchen ipratropium-albuterol (DUONEB) 0.5-2.5 (3) MG/3ML SOLN Take 3 mLs by nebulization 4 (four)  times daily as needed.  Marland Kitchen levothyroxine (SYNTHROID, LEVOTHROID) 125 MCG tablet Take 125 mcg by mouth daily before breakfast.  . LORazepam (ATIVAN) 0.5 MG tablet Take 0.25 mg by mouth every 4 (four) hours as needed.   Marland Kitchen losartan (COZAAR) 25 MG tablet Take 1 tablet (25 mg total) by mouth daily.  . meclizine (ANTIVERT) 25 MG tablet Take 25 mg by mouth every 6 (six) hours as needed.   . Menthol, Topical Analgesic, (BIOFREEZE) 4 % GEL Apply topically. Apply to left knee four times daily as needed for pain  . metoprolol tartrate (LOPRESSOR) 25 MG tablet Take 1 tablet (25 mg total) by mouth 2 (two) times daily.  . mupirocin nasal ointment (BACTROBAN) 2 % Place 1 application into the nose 2 (two) times daily as needed.  Marland Kitchen Neomycin-Bacitracin-Polymyxin (TRIPLE ANTIBIOTIC) 3.5-3317773582 OINT Apply 1 application topically daily as needed (apply to bilateral nostrils).  . nitroGLYCERIN (NITROSTAT) 0.4 MG SL tablet Place 0.4 mg under the tongue every 5 (five) minutes as needed for chest pain.   Marland Kitchen olopatadine (PATANOL) 0.1 % ophthalmic solution Place 1 drop into both eyes 2 (two) times daily as needed for allergies.  . OXYGEN Inhale 2 L into the lungs continuous.  . polyethylene glycol (MIRALAX / GLYCOLAX) packet Take 17 g by mouth daily as needed.   . RESOURCE BENEPROTEIN PO Take 15 mLs by mouth daily as needed.  . tamsulosin (FLOMAX) 0.4 MG CAPS capsule Take one cap by mouth daily  . tobramycin-dexamethasone (TOBRADEX) ophthalmic ointment Place 1 application into both eyes daily as needed.   . triamcinolone cream (KENALOG) 0.1 % Apply 1 application topically daily.  Marland Kitchen UNABLE TO FIND Med Name: Hydrocortisone cream with applicator 1%. Apply to hemorrhoids 2 times daily as needed  . [DISCONTINUED] polyvinyl alcohol (LIQUIFILM TEARS) 1.4 % ophthalmic solution Place 1 drop into both eyes as needed for dry eyes.   No facility-administered encounter medications on file as of 01/05/2017.     Review of Systems    Constitutional: Negative for appetite change and fever.  HENT: Positive for nosebleeds and rhinorrhea. Negative for congestion, ear pain, postnasal drip, sore throat and trouble swallowing.   Respiratory: Negative for cough.   Cardiovascular: Negative for palpitations.  Gastrointestinal: Negative for anal bleeding and blood in stool.  Genitourinary: Negative for hematuria.  Skin: Negative for rash.  Hematological: Bruises/bleeds easily.    Immunization History  Administered Date(s) Administered  . Influenza Whole 12/06/2011, 12/19/2012  .  Influenza-Unspecified 01/09/2014, 11/25/2014, 12/23/2015, 12/26/2016  . PPD Test 05/03/2013  . Pneumococcal Polysaccharide-23 03/07/1986  . Td 04/20/2005  . Tdap 01/03/2017   Pertinent  Health Maintenance Due  Topic Date Due  . OPHTHALMOLOGY EXAM  01/12/1934  . PNA vac Low Risk Adult (1 of 2 - PCV13) 01/12/1989  . FOOT EXAM  07/31/2015  . HEMOGLOBIN A1C  05/17/2017  . INFLUENZA VACCINE  Completed   Fall Risk  11/15/2016 10/15/2015 06/11/2015 10/23/2014 07/07/2014  Falls in the past year? No No Yes Yes Yes  Number falls in past yr: - - _0 Injury with Fall? - - Yes - No  Risk for fall due to : - - - - History of fall(s);Impaired mobility;Impaired balance/gait  Follow up - - - - Falls evaluation completed;Falls prevention discussed   Functional Status Survey:    Vitals:   01/05/17 1558  BP: 120/70  Pulse: 74  Resp: 20  Temp: (!) 97.2 F (36.2 C)  TempSrc: Oral  SpO2: 97%  Weight: 159 lb 12.8 oz (72.5 kg)  Height: _1  (1.727 m)   Body mass index is 24.3 kg/m. Physical Exam  Constitutional: He is oriented to person, place, and time. He appears well-developed and well-nourished. No distress.  HENT:  Head: Normocephalic and atraumatic.  Mouth/Throat: Oropharynx is clear and moist. No oropharyngeal exudate.  Dried blood left nostril, normal nasal mucosa  Eyes: Pupils are equal, round, and reactive to light. Conjunctivae and EOM are  normal.  Neck: Neck supple.  Cardiovascular: Normal rate and regular rhythm.   Murmur heard. Pulmonary/Chest: Effort normal and breath sounds normal.  Abdominal: Soft. Bowel sounds are normal.  Lymphadenopathy:    He has no cervical adenopathy.  Neurological: He is alert and oriented to person, place, and time.  Skin: He is not diaphoretic.    Labs reviewed:  Recent Labs  10/11/16 11/03/16 12/08/16  NA 134* 135* 134*  K 4.5 4.4 4.4  BUN 22* 23* 22*  CREATININE 1.0 1.0 1.0    Recent Labs  05/05/16 06/15/16 09/29/16  AST _2 ALT _3 ALKPHOS 98 82 78    Recent Labs  05/05/16 06/15/16 11/03/16  WBC 6.4 7.3 6.1  HGB 16.2 16.2 15.5  HCT 48 48 46  PLT 195 217 229   Lab Results  Component Value Date   TSH 1.69 11/03/2016   Lab Results  Component Value Date   HGBA1C 6.1 11/17/2016   Lab Results  Component Value Date   CHOL 130 10/18/2016   HDL 32 (A) 10/18/2016   LDLCALC 83 10/18/2016   TRIG 70 10/18/2016   CHOLHDL 3.9 05/01/2013    Significant Diagnostic Results in last 30 days:  No results found.  Assessment/Plan  Recurrent nose bleed Likely multifactorial with dry nasal mucosa with ? Sleep apnea, use of atrovent nasal spray, rhinitis, pissble nasal trauma with nose picking and being on anticoagulation and aspirin. Self limited with afrin nasal spray and application of pressure. Thus, Likely anterior nose bleed but given recurrent bleed, ENT referral for further evaluation. Hold baby aspirin for now. D/c atrovent nasal spray and bactroban ointment for now to avoid trauma to mucosa and septum for now. humidifer to be used in room.   afib Continue eliquis for now, if normal ENT exam, consider further discussion on anticoagulation with cardiology   Family/ staff Communication: reviewed care plan with patient and charge nurse.    Labs/tests ordered:  none  Reighlyn Elmes  Bubba Camp, MD Internal Medicine [facility] [address] [place], [place] [address] Cell Phone (Monday-Friday 8 am - 5 pm): [phone] On Call: [phone] and follow prompts after 5 pm and on weekends Office Phone: [phone] Office Fax: [phone]

## 2017-01-05 NOTE — Telephone Encounter (Signed)
[facility]  [facility] [place],  [id number] NAME:    Jon [name]                                                         DOB: 1921-07-09 MEDICAL RECORD NUMBER [id number]                                                  DOS: 01/02/2017 REFERRING PHYSICIAN:  Rosalin Hawking, M.D. and  Blanchie Serve, M.D.  STUDY PERFORMED: Home Sleep Study HISTORY: 12-13-2016 [name] is seen in sleep consultation at our Neurology Clinic. [name] has been followed yearly by Dr. Erlinda Hong, his stroke neurologist, who now requested a sleep study. Important for re-evaluation of possible sleep apnea is his history of transient ischemic attacks, cerebral infarction, as well as nocturia, atherosclerotic heart disease of the native coronary artery, systolic congestive heart failure, chronic, paroxysmal atrial fibrillation, hypothyroidism and essential hypertension. These conditions affect the patient's ability to get restful sleep and sustain sleep. The patient was diagnosed with OSA over 15 years ago and was placed on CPAP- he used the device for many years and after he lapsed CPAP compliance for a while noted no difference. He stopped using it. BMI 24.0.  STUDY RESULTS: Total Recording Time: 6 hours, 32 minutes Total Apnea/Hypopnea Index (AHI): 57.8 /hr. and RDI of 59.9/hr. Obstructive apneas dominate Average Oxygen Saturation: SpO2 at 92 %; Lowest Oxygen Saturation: 82%  Time Oxygen Saturation Below 88%:  91 minutes =24 % Average Heart Rate: 63 bpm (57-205)  IMPRESSION: Severe sleep apnea, AHI of 57.8/hr.   Severe and prolonged hypoxemia with 91 minutes total desaturation time, clinically critical.  Tachycardia  RECOMMENDATION: Given the comorbidities and signs of physiological stress associated with severe apnea, I recommend urgent PAP titration. I ordered a PAP titration study through our laboratory.  I certify that I have reviewed the raw data recording prior to the  issuance of this report in accordance with the standards of the American Academy of Sleep Medicine (AASM). Larey Seat, M.D.       01-05-2017  Medical Director of [place] [facility] at [facility]; [place] and [employer], and accredited by AASM

## 2017-01-05 NOTE — Telephone Encounter (Signed)
Thanks, Asencion Partridge, for taking care of him. - Butch Otterson

## 2017-01-05 NOTE — Procedures (Signed)
[facility]  [facility] [address] [place], [place] [address] NAME:    Jon [name]                                                         DOB: [birth year] MEDICAL RECORD NUMBER [id number]                                                  DOS: 01/02/17 REFERRING PHYSICIAN:  Rosalin Hawking, M.D. and  Blanchie Serve, M.D.  STUDY PERFORMED: Home Sleep Study HISTORY: 12-13-2016 Jon [name] is seen in sleep consultation at our Neurology Clinic. [name] has been followed yearly by Dr. Erlinda Hong, his stroke neurologist, who now requested a sleep study. Important for re-evaluation of possible sleep apnea is his history of transient ischemic attacks, cerebral infarction, as well as nocturia, atherosclerotic heart disease of the native coronary artery, systolic congestive heart failure, chronic, paroxysmal atrial fibrillation, hypothyroidism and essential hypertension. These conditions affect the patient's ability to get restful sleep and sustain sleep. The patient was diagnosed with OSA over 15 years ago and was placed on CPAP- he used the device for many years and after he lapsed CPAP compliance for a while noted no difference. He stopped using it. BMI 24.0.  STUDY RESULTS: Total Recording Time: 6 hours, 32 minutes Total Apnea/Hypopnea Index (AHI): 57.8 /hr. and RDI of 59.9/hr. Obstructive apneas dominate Average Oxygen Saturation: SpO2 at 92 %; Lowest Oxygen Saturation: 82%  Time Oxygen Saturation Below 88%:  91 minutes =24 % Average Heart Rate: 63 bpm (57-205)  IMPRESSION: Severe sleep apnea, AHI of 57.8/hr.   Severe and prolonged hypoxemia with 91 minutes total desaturation time, clinically critical.  Tachycardia  RECOMMENDATION: Given the comorbidities and signs of physiological stress associated with severe apnea, I recommend urgent PAP titration. I ordered a PAP titration study through our laboratory.  I certify that I have reviewed the raw data recording prior to the issuance  of this report in accordance with the standards of the American Academy of Sleep Medicine (AASM). Larey Seat, M.D.       01-05-2017  Medical Director of [place] [facility] at [facility]; [place] and [employer], and accredited by AASM

## 2017-01-05 NOTE — Addendum Note (Signed)
Addended by: Larey Seat on: 01/05/2017 02:50 PM   Modules accepted: Orders

## 2017-01-06 ENCOUNTER — Encounter: Payer: Self-pay | Admitting: Nurse Practitioner

## 2017-01-06 ENCOUNTER — Telehealth: Payer: Self-pay | Admitting: Neurology

## 2017-01-06 NOTE — Telephone Encounter (Signed)
-----   Message from Larey Seat, MD sent at 01/05/2017  2:50 PM EDT ----- Stroke patient with severe apnea, hypoxemia and cardiac response. AHI 57.8- 91 minutes in desat. This patient has co -morbidities and signs of physiological stress due to apnea that warrant urgent titration study. I do not see an alternative to CPAP or BiPAP therapy.

## 2017-01-06 NOTE — Telephone Encounter (Signed)
Called the patient to discuss sleep study results. No answer. LVM for the pt to call back

## 2017-01-09 NOTE — Telephone Encounter (Signed)
Pt daughter(on DPR)is asking for a call back from [employer]

## 2017-01-09 NOTE — Telephone Encounter (Signed)
I called pt's daughter. I advised her that Dr. Brett Fairy reviewed their sleep study results and found that pt had severe sleep apnea. Dr. Brett Fairy recommends that pt has a cpap titration test completed to address what pressure for CPAP will be needed. I have informed her of the process that takes place and that we will need insurance approval to proceed forward. Pt's daughter verbalized understanding of results. She is asking that we contact her when we need to schedule this because her dad has a hard time hearing and is rarely by his phone. Delphina Cahill is her name. She is listed on the [facility]. Pt's daughter had no questions at this time but was encouraged to call back if questions arise.

## 2017-01-17 ENCOUNTER — Non-Acute Institutional Stay: Payer: Medicare Other | Admitting: Nurse Practitioner

## 2017-01-17 ENCOUNTER — Encounter: Payer: Self-pay | Admitting: Nurse Practitioner

## 2017-01-17 DIAGNOSIS — I10 Essential (primary) hypertension: Secondary | ICD-10-CM

## 2017-01-17 DIAGNOSIS — I5022 Chronic systolic (congestive) heart failure: Secondary | ICD-10-CM | POA: Diagnosis not present

## 2017-01-17 DIAGNOSIS — N3942 Incontinence without sensory awareness: Secondary | ICD-10-CM | POA: Diagnosis not present

## 2017-01-17 DIAGNOSIS — I480 Paroxysmal atrial fibrillation: Secondary | ICD-10-CM

## 2017-01-17 DIAGNOSIS — M1A00X0 Idiopathic chronic gout, unspecified site, without tophus (tophi): Secondary | ICD-10-CM | POA: Diagnosis not present

## 2017-01-17 DIAGNOSIS — Z7901 Long term (current) use of anticoagulants: Secondary | ICD-10-CM | POA: Diagnosis not present

## 2017-01-17 DIAGNOSIS — R040 Epistaxis: Secondary | ICD-10-CM

## 2017-01-17 DIAGNOSIS — E871 Hypo-osmolality and hyponatremia: Secondary | ICD-10-CM

## 2017-01-17 NOTE — Assessment & Plan Note (Signed)
Stable, last Na 134 12/08/16.

## 2017-01-17 NOTE — Assessment & Plan Note (Addendum)
No gout flare ups, continue Allopurinol 50mg  daily. Last Urinc acid was 7.6 09/14/16

## 2017-01-17 NOTE — Assessment & Plan Note (Signed)
Heat rate is in control, continue Metoprolol 25mg  bid, Apixaban 2.5mg  bid for thromboembolic risk reduction.

## 2017-01-17 NOTE — Assessment & Plan Note (Signed)
Compensated, continue Furosemide 60mg  qd, seeing Cardiology regularly.

## 2017-01-17 NOTE — Assessment & Plan Note (Addendum)
recurrent nose bleed, pending ENT consultation 01/31/17, he is off ASA and Atrovent nasal spray, last nose bleed 01/16/17

## 2017-01-17 NOTE — Assessment & Plan Note (Signed)
Afib, continue Apixaban 2.5mg  bid po for thromboembolic risk

## 2017-01-17 NOTE — Assessment & Plan Note (Signed)
Urinary frequency/leakage, seeing Urology, continue Tamsulosin 0.4mg  qd

## 2017-01-17 NOTE — Progress Notes (Signed)
Location:  [facility] [place] Room Number: [place] of Service:  ALF [phone]) Provider:  Harli Engelken, Manxie  NP  Blanchie Serve, MD  Patient Care Team: Blanchie Serve, MD as PCP - General (Internal Medicine) [place], Nadean Corwin, MD as Consulting Physician (Cardiology) Ardis Hughs, MD as Attending Physician (Urology) Deardra Hinkley X, NP as Nurse Practitioner (Internal Medicine)  Extended Emergency Contact Information Primary Emergency Contact: [name] of [place] Phone: [phone] Relation: Daughter Secondary Emergency Contact: Eyvonne Mechanic [place] of [place] Phone: [phone] Mobile Phone: [phone] Relation: Daughter  Code Status:  DNR Goals of care: Advanced Directive information Advanced Directives 01/05/2017  Does Patient Have a Medical Advance Directive? Yes  Type of Paramedic of [place];Living will;Out of facility DNR (pink MOST or yellow form)  Does patient want to make changes to medical advance directive? No - Patient declined  Copy of [place] in Chart? Yes  Pre-existing out of facility DNR order (yellow form or pink MOST form) Yellow form placed in chart (order not valid for inpatient use)     Chief Complaint  Patient presents with  . Medical Management of Chronic Issues    HPI:  Pt is a 83 y.o. male seen today for medical management of chronic diseases.     The patient has history of recurrent nose bleed, pending ENT consultation 01/31/17, he is off ASA and Atrovent nasal spray since 01/05/17, last nose bleed 01/16/17, stopped by pressure dressing. History of hyponatremia, last Na 134 12/08/16. Urinary frequency/leakage, seeing Urology, taking Tamsulosin 0.'4mg'$  qd,8.  HTN blood pressure is controlled on Metoprolol '25mg'$  bid, Losartan '25mg'$  qd. No gout flare ups while on Allopurinol '50mg'$  daily. He is taking Apixaban 2.'5mg'$  bid po for  thromboembolic risk reduction. CHF compensated on Furosemide '60mg'$  qd, seeing Cardiology regularly. Afb, Heart rate is in control, has pacemaker.    Past Medical History:  Diagnosis Date  . Abnormal PFTs 10/30/2012   Followed in Pulmonary clinic/ [facility]/ [place]  - PFT's 10/22/12 VC  55% no obst, DLCO 50%  - PFT's 12/20/2012 VC 83% and no obst, dLCO 55%  -11/08/2012  Walked RA x 3 laps @ 185 ft each stopped due to  End of study, not desat -11/08/12 esr 10    . Abnormality of gait 04/18/2013  . Acute on chronic systolic congestive heart failure, NYHA class 2 ([place]) 03/16/2006   BNP 376.9 05/28/13   . Adjustment disorder with mixed anxiety and depressed mood 04/18/2013   Post CVA depression and anxiety   . Allergy   . Anxiety   . Aortic aneurysm of unspecified site without mention of rupture 10/27/2011  . Arthritis   . Atherosclerosis of renal artery ([place]) 10/18/2006  . Atrial fibrillation ([place]) 03/04/2011    He was on coumadin until his stroke last December and was switched to apixaban 2.'5mg'$  daily which he has been taking faithfully without reported side effects.    . Balance disorder 07/31/2014  . Basal cell carcinoma of skin of other and unspecified parts of face 12/24/2009  . Blood transfusion without reported diagnosis   . Cardiac pacemaker in situ- MDT 10/11 03/18/2010   Medtronic revo implant in October 2011. Severe sinus bradycardia in the 30s, but not truly pacemaker dependent   . Cataract   . CKD stage 2 due to type 2 diabetes mellitus ([place]) 07/03/2014  . Coronary artery disease   . CVA - Rt brain  stroke 12/14 02/18/2013   02/19/13 angiography CT head:  Diffuse atherosclerotic irregularity and plaque formation of the distal right common carotid artery and proximal internal carotid artery without significant stenosis. Plaque ulceration is present and is a source of emboli. A small right thalamic CVA    . DM (diabetes mellitus), type 2 with renal complications ([employer]) 2/95/1884  .  Dyslipidemia 11/03/2004  . Fall 03/04/2011  . Fatigue 04/18/2013  . Gout, unspecified 11/03/2004  . Heart murmur   . History of abdominal aortic aneurysm   . History of Doppler ultrasound 05/22/2012   LEAs; R anterior tibial artery appeared occluded; L posterior tibial shows short segment of occlusive ds  . History of Doppler ultrasound 05/22/2012   Abdominal Aortic Doppler; slight increase in fusiform aneurysm   . History of echocardiogram 12/2008   EF >55%; mild concentric LVH; mild MR; mild-mod TR; mild AV regurg;   . History of nuclear stress test 12/2010   lexiscan; low risk; compared to prior study, perfusion improved  . HTN (hypertension) 03/04/2011  . Hyperlipidemia   . Hypertension   . Hypertrophy of prostate with urinary obstruction and other lower urinary tract symptoms (LUTS) 04/28/2004  . Hyponatremia 03/04/2011  . Hypothyroidism 03/04/2011  . Impotence of organic origin 10/03/2000  . Internal hemorrhoids without mention of complication 16/60/6301  . Long term (current) use of anticoagulants 03/17/2011  . Major depressive disorder, single episode, unspecified 03/05/2009  . Memory change 01/24/2013  . Muscle weakness (generalized) 03/10/2011  . Nocturia 10/27/2011  . Obstructive sleep apnea (adult) (pediatric) 07/23/2008  . Open wound of knee, leg (except thigh), and ankle, complicated 08/06/930  . Osteoarthritis of both knees 07/31/2014  . Osteoarthrosis, unspecified whether generalized or localized, unspecified site 02/05/2003  . PAF (paroxysmal atrial fibrillation) (HCC)    coumadin  . Pain in joint, lower leg 08/04/2011  . Pruritic condition 01/24/2013  . Quadriceps weakness 07/31/2014  . Right bundle branch block 04/19/2006  . 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 Spinal stenosis in cervical region 12/15/2004  . SSS (sick sinus syndrome) ([place]) 04/08/2014  . Stroke ([place])   . Thyroid disease   . TIA (transient ischemic attack)  04/30/2013  . Type II or unspecified type diabetes mellitus without mention of complication, not stated as uncontrolled 11/24/2004  . Unspecified vitamin D deficiency 10/18/2006  . 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 Increased exposure of the right cornea.    Past Surgical History:  Procedure Laterality Date  . CORONARY ANGIOPLASTY WITH STENT PLACEMENT  2008   stent to SVG to OM  . CORONARY ARTERY BYPASS GRAFT  1989  . EYE SURGERY    . HERNIA REPAIR    . INSERT / REPLACE / REMOVE PACEMAKER    . JOINT REPLACEMENT    . PACEMAKER INSERTION  2011    Allergies  Allergen Reactions  . Quinolones Other (See Comments)    Risk of rupture of AAA  . Caduet [Amlodipine-Atorvastatin] Other (See Comments)    Weakness   . Crestor [Rosuvastatin] Other (See Comments)    Muscle pain/weakness  . Lipitor [Atorvastatin Calcium] Other (See Comments)    Muscle pain/weakness  . Simvastatin Other (See Comments)    Muscle pain/weakness  . Norvasc [Amlodipine]   . Lisinopril Other (See Comments)    Doesn't remember reaction    Outpatient Encounter Medications as of 01/17/2017  Medication Sig  . allopurinol (ZYLOPRIM) 100  MG tablet Take 50 mg by mouth daily.   Marland Kitchen amoxicillin (AMOXIL) 500 MG capsule Take 500 mg by mouth. Take 4 caps prior to dental procedures  . apixaban (ELIQUIS) 2.5 MG TABS tablet Take 1 tablet (2.5 mg total) by mouth 2 (two) times daily.  Marland Kitchen aspirin EC 81 MG tablet Take 81 mg by mouth daily.  . carboxymethylcellulose (REFRESH PLUS) 0.5 % SOLN Place 1 drop into both eyes 3 (three) times daily. May also do daily as needed  . Cholecalciferol 1000 units tablet Take 2,000 Units by mouth daily.  Marland Kitchen Dextran 70-Hypromellose 0.1-0.3 % SOLN Apply 1 drop to eye 2 (two) times daily as needed.  . furosemide (LASIX) 20 MG tablet Take 60 mg by mouth daily.   Marland Kitchen ipratropium (ATROVENT) 0.06 % nasal spray Place 1 spray into both nostrils 3 (three) times daily as needed.   Marland Kitchen  ipratropium-albuterol (DUONEB) 0.5-2.5 (3) MG/3ML SOLN Take 3 mLs by nebulization 4 (four) times daily as needed.  Marland Kitchen levothyroxine (SYNTHROID, LEVOTHROID) 125 MCG tablet Take 125 mcg by mouth daily before breakfast.  . LORazepam (ATIVAN) 0.5 MG tablet Take 0.25 mg by mouth every 4 (four) hours as needed.   Marland Kitchen losartan (COZAAR) 25 MG tablet Take 1 tablet (25 mg total) by mouth daily.  . meclizine (ANTIVERT) 25 MG tablet Take 25 mg by mouth every 6 (six) hours as needed.   . Menthol, Topical Analgesic, (BIOFREEZE) 4 % GEL Apply topically. Apply to left knee four times daily as needed for pain  . metoprolol tartrate (LOPRESSOR) 25 MG tablet Take 1 tablet (25 mg total) by mouth 2 (two) times daily.  . mupirocin nasal ointment (BACTROBAN) 2 % Place 1 application into the nose 2 (two) times daily as needed.  Marland Kitchen Neomycin-Bacitracin-Polymyxin (TRIPLE ANTIBIOTIC) [phone] OINT Apply 1 application topically daily as needed (apply to bilateral nostrils).  . nitroGLYCERIN (NITROSTAT) 0.4 MG SL tablet Place 0.4 mg under the tongue every 5 (five) minutes as needed for chest pain.   Marland Kitchen olopatadine (PATANOL) 0.1 % ophthalmic solution Place 1 drop into both eyes 2 (two) times daily as needed for allergies.  . OXYGEN Inhale 2 L into the lungs continuous.  . polyethylene glycol (MIRALAX / GLYCOLAX) packet Take 17 g by mouth daily as needed.   . RESOURCE BENEPROTEIN PO Take 15 mLs by mouth daily as needed.  . tamsulosin (FLOMAX) 0.4 MG CAPS capsule Take one cap by mouth daily  . tobramycin-dexamethasone (TOBRADEX) ophthalmic ointment Place 1 application into both eyes daily as needed.   . triamcinolone cream (KENALOG) 0.1 % Apply 1 application topically daily.  Marland Kitchen UNABLE TO FIND Med Name: Hydrocortisone cream with applicator 1%. Apply to hemorrhoids 2 times daily as needed  . [DISCONTINUED] furosemide (LASIX) 20 MG tablet Take 20 mg by mouth as needed (if weight gain >3lbs in 24hrs or >5lbs in a week).   No  facility-administered encounter medications on file as of 01/17/2017.     Review of Systems  Constitutional: Negative for activity change, appetite change, chills, diaphoresis, fatigue and fever.  HENT: Positive for rhinorrhea. Negative for congestion, nosebleeds, trouble swallowing and voice change.        Running nose when eating.   Eyes: Negative for visual disturbance.  Respiratory: Negative for cough, chest tightness, shortness of breath and wheezing.   Cardiovascular: Negative for chest pain, palpitations and leg swelling.  Gastrointestinal: Negative for abdominal distention, abdominal pain and constipation.  Endocrine: Negative for cold intolerance.  Genitourinary: Negative  for difficulty urinating, dysuria and urgency.       Urinary leakage.   Musculoskeletal: Positive for gait problem. Negative for back pain.       Ambulates with walker  Skin: Negative for color change and rash.  Neurological: Negative for tremors, speech difficulty, weakness and headaches.  Psychiatric/Behavioral: Negative for agitation, behavioral problems, confusion and sleep disturbance. The patient is not nervous/anxious.     Immunization History  Administered Date(s) Administered  . Influenza Whole 12/06/2011, 12/19/2012  . Influenza-Unspecified 01/09/2014, 11/25/2014, 12/23/2015, 12/26/2016  . PPD Test 05/03/2013  . Pneumococcal Polysaccharide-23 03/07/1986  . Td 04/20/2005  . Tdap 01/03/2017   Pertinent  Health Maintenance Due  Topic Date Due  . OPHTHALMOLOGY EXAM  01/12/1932  . PNA vac Low Risk Adult (1 of 2 - PCV13) 01/12/1989  . FOOT EXAM  07/31/2015  . HEMOGLOBIN A1C  05/17/2017  . INFLUENZA VACCINE  Completed   Fall Risk  11/15/2016 10/15/2015 06/11/2015 10/23/2014 07/07/2014  Falls in the past year? No No Yes Yes Yes  Number falls in past yr: - - '1 1 1  '$ Injury with Fall? - - Yes - No  Risk for fall due to : - - - - History of fall(s);Impaired mobility;Impaired balance/gait  Follow up - - - -  Falls evaluation completed;Falls prevention discussed   Functional Status Survey:    Vitals:   01/17/17 1013  BP: 118/70  Pulse: 69  Resp: 18  Temp: 97.9 F (36.6 C)  SpO2: 97%  Weight: 158 lb 12.8 oz (72 kg)  Height: '5\' 8"'$  (1.727 m)   Body mass index is 24.15 kg/m. Physical Exam  Constitutional: He is oriented to person, place, and time. He appears well-developed and well-nourished. No distress.  HENT:  Head: Normocephalic and atraumatic.  Mouth/Throat: Oropharynx is clear and moist. No oropharyngeal exudate.  Left septum ulcerated area noted.   Eyes: Conjunctivae and EOM are normal. Pupils are equal, round, and reactive to light.  Neck: Neck supple.  Cardiovascular: Normal rate and regular rhythm.  Murmur heard. pacemaker  Pulmonary/Chest: Effort normal and breath sounds normal.  Abdominal: Soft. Bowel sounds are normal.  Lymphadenopathy:    He has no cervical adenopathy.  Neurological: He is alert and oriented to person, place, and time.  Skin: He is not diaphoretic.    Labs reviewed: Recent Labs    10/11/16 11/03/16 12/08/16  NA 134* 135* 134*  K 4.5 4.4 4.4  BUN 22* 23* 22*  CREATININE 1.0 1.0 1.0   Recent Labs    05/05/16 06/15/16 09/29/16  AST '18 23 17  '$ ALT '14 16 11  '$ ALKPHOS 98 82 78   Recent Labs    05/05/16 06/15/16 11/03/16  WBC 6.4 7.3 6.1  HGB 16.2 16.2 15.5  HCT 48 48 46  PLT 195 217 229   Lab Results  Component Value Date   TSH 1.69 11/03/2016   Lab Results  Component Value Date   HGBA1C 6.1 11/17/2016   Lab Results  Component Value Date   CHOL 130 10/18/2016   HDL 32 (A) 10/18/2016   LDLCALC 83 10/18/2016   TRIG 70 10/18/2016   CHOLHDL 3.9 05/01/2013    Significant Diagnostic Results in last 30 days:  No results found.  Assessment/Plan Atrial fibrillation (HCC) Heat rate is in control, continue Metoprolol '25mg'$  bid, Apixaban 2.'5mg'$  bid for thromboembolic risk reduction.   Chronic systolic congestive heart failure  (HCC) Compensated, continue Furosemide '60mg'$  qd, seeing Cardiology regularly.  Long term current  use of anticoagulant therapy Afib, continue Apixaban 2.'5mg'$  bid po for thromboembolic risk  Chronic gout without tophus No gout flare ups, continue Allopurinol '50mg'$  daily. Last Urinc acid was 7.6 09/14/16  HTN (hypertension) HTN blood pressure is controlled, continue Metoprolol '25mg'$  bid, Losartan '25mg'$  qd  Incontinent of urine Urinary frequency/leakage, seeing Urology, continue Tamsulosin 0.'4mg'$  qd  Hyponatremia Stable, last Na 134 12/08/16.  Epistaxis, recurrent  recurrent nose bleed, pending ENT consultation 01/31/17, he is off ASA and Atrovent nasal spray, last nose bleed 01/16/17     Family/ staff Communication: plan of care reviewed with the patient and charge nurse.   Labs/tests ordered:  none  Time spend 25 minutes.

## 2017-01-17 NOTE — Assessment & Plan Note (Signed)
HTN blood pressure is controlled, continue Metoprolol 25mg  bid, Losartan 25mg  qd

## 2017-01-18 ENCOUNTER — Telehealth: Payer: Self-pay | Admitting: Neurology

## 2017-01-18 DIAGNOSIS — L821 Other seborrheic keratosis: Secondary | ICD-10-CM | POA: Diagnosis not present

## 2017-01-18 DIAGNOSIS — Z85828 Personal history of other malignant neoplasm of skin: Secondary | ICD-10-CM | POA: Diagnosis not present

## 2017-01-18 DIAGNOSIS — L570 Actinic keratosis: Secondary | ICD-10-CM | POA: Diagnosis not present

## 2017-01-18 NOTE — Telephone Encounter (Signed)
We have attempted to call the patient 2 times to schedule sleep study. Patient has been unavailable at the phone numbers we have on file and has not returned our calls. At this point we will send a letter asking pt to please contact the sleep lab to schedule their sleep study. If patient calls back we will schedule them for their sleep study. ° °

## 2017-01-19 ENCOUNTER — Other Ambulatory Visit: Payer: Self-pay | Admitting: Neurology

## 2017-01-19 ENCOUNTER — Telehealth: Payer: Self-pay

## 2017-01-19 DIAGNOSIS — G4733 Obstructive sleep apnea (adult) (pediatric): Secondary | ICD-10-CM

## 2017-01-19 NOTE — Telephone Encounter (Signed)
Thank you for placing the order. CD

## 2017-01-19 NOTE — Telephone Encounter (Signed)
Called the daughter to make her aware that Dr Brett Fairy will be ok with the patient starting a auto CPAP. I reviewed PAP compliance expectations with the pt. Pt is agreeable to starting a CPAP. I advised pt that an order will be sent to a DME, [facility], and [facility] will call the pt within about one week after they file with the pt's insurance. [facility] will show the pt how to use the machine, fit for masks, and troubleshoot the CPAP if needed. The patients daughter had to get off the phone as she was at work. I instructed her to call back so that we could set up an appointment to get him in within 60-90 days to assess compliant. Pt can have an appointment set up with Robin Hyler on thurs if there is no available apt with MD or NP. Pt's daughter verbalized understanding.

## 2017-01-19 NOTE — Telephone Encounter (Signed)
Patients daughter does not want him to come in lab for cpap titration due to his age. Do you want to order an Auto unit?

## 2017-01-31 DIAGNOSIS — I4891 Unspecified atrial fibrillation: Secondary | ICD-10-CM | POA: Diagnosis not present

## 2017-01-31 DIAGNOSIS — Z7289 Other problems related to lifestyle: Secondary | ICD-10-CM | POA: Diagnosis not present

## 2017-01-31 DIAGNOSIS — R040 Epistaxis: Secondary | ICD-10-CM | POA: Diagnosis not present

## 2017-01-31 DIAGNOSIS — Z7901 Long term (current) use of anticoagulants: Secondary | ICD-10-CM | POA: Diagnosis not present

## 2017-01-31 DIAGNOSIS — H6123 Impacted cerumen, bilateral: Secondary | ICD-10-CM | POA: Diagnosis not present

## 2017-02-08 ENCOUNTER — Ambulatory Visit: Payer: Medicare Other | Admitting: *Deleted

## 2017-02-08 ENCOUNTER — Telehealth: Payer: Self-pay | Admitting: Cardiology

## 2017-02-08 NOTE — Telephone Encounter (Signed)
Attempted to confirm remote transmission with pt. No answer and was unable to leave a message.   

## 2017-02-10 ENCOUNTER — Encounter: Payer: Self-pay | Admitting: Cardiology

## 2017-02-10 NOTE — Progress Notes (Signed)
Opened in error / remote not received  

## 2017-02-15 DIAGNOSIS — D044 Carcinoma in situ of skin of scalp and neck: Secondary | ICD-10-CM | POA: Diagnosis not present

## 2017-02-15 DIAGNOSIS — L570 Actinic keratosis: Secondary | ICD-10-CM | POA: Diagnosis not present

## 2017-02-15 DIAGNOSIS — D485 Neoplasm of uncertain behavior of skin: Secondary | ICD-10-CM | POA: Diagnosis not present

## 2017-02-15 DIAGNOSIS — L821 Other seborrheic keratosis: Secondary | ICD-10-CM | POA: Diagnosis not present

## 2017-02-20 ENCOUNTER — Encounter (INDEPENDENT_AMBULATORY_CARE_PROVIDER_SITE_OTHER): Payer: Medicare Other | Admitting: Ophthalmology

## 2017-02-20 DIAGNOSIS — H353131 Nonexudative age-related macular degeneration, bilateral, early dry stage: Secondary | ICD-10-CM

## 2017-02-20 DIAGNOSIS — H338 Other retinal detachments: Secondary | ICD-10-CM

## 2017-02-20 DIAGNOSIS — H43813 Vitreous degeneration, bilateral: Secondary | ICD-10-CM

## 2017-02-20 DIAGNOSIS — I10 Essential (primary) hypertension: Secondary | ICD-10-CM

## 2017-02-20 DIAGNOSIS — H35372 Puckering of macula, left eye: Secondary | ICD-10-CM

## 2017-02-20 DIAGNOSIS — H35033 Hypertensive retinopathy, bilateral: Secondary | ICD-10-CM | POA: Diagnosis not present

## 2017-02-22 ENCOUNTER — Non-Acute Institutional Stay: Payer: Medicare Other | Admitting: Nurse Practitioner

## 2017-02-22 DIAGNOSIS — I480 Paroxysmal atrial fibrillation: Secondary | ICD-10-CM

## 2017-02-22 DIAGNOSIS — N401 Enlarged prostate with lower urinary tract symptoms: Secondary | ICD-10-CM | POA: Diagnosis not present

## 2017-02-22 DIAGNOSIS — I5022 Chronic systolic (congestive) heart failure: Secondary | ICD-10-CM | POA: Diagnosis not present

## 2017-02-22 NOTE — Progress Notes (Signed)
Location:  [place] Room Number: [place] of Service:  ALF [phone]) Provider:  Krystol Rocco, Manxie  NP   Blanchie Serve, MD  Patient Care Team: Blanchie Serve, MD as PCP - General (Internal Medicine) [place], Nadean Corwin, MD as Consulting Physician (Cardiology) Ardis Hughs, MD as Attending Physician (Urology) Shanekqua Schaper X, NP as Nurse Practitioner (Internal Medicine)  Extended Emergency Contact Information Primary Emergency Contact: [name] of [place] Phone: [phone] Relation: Daughter Secondary Emergency Contact: Eyvonne Mechanic [place] of [place] Phone: [phone] Mobile Phone: [phone] Relation: Daughter  Code Status:  DNR Goals of care: Advanced Directive information Advanced Directives 01/05/2017  Does Patient Have a Medical Advance Directive? Yes  Type of Paramedic of [place];Living will;Out of facility DNR (pink MOST or yellow form)  Does patient want to make changes to medical advance directive? No - Patient declined  Copy of [place] in Chart? Yes  Pre-existing out of facility DNR order (yellow form or pink MOST form) Yellow form placed in chart (order not valid for inpatient use)     Chief Complaint  Patient presents with  . Acute Visit    cough, runny nose    HPI:  Pt is a 83 y.o. male seen today for an acute visit for coughing, scant clear sputum production, seems more urinary incontinence than prior, taking Tamsulosin 0.67m qd, but he denied chest pain, palpitation, dizziness, dysuria, lower abd or lower back pain. He is afebrile, no O2 desaturation. He admitted refusal of Furosemide for a couple of days. He has history of CHF, chronic leg edema, taking Furosemide 618mdaily. Hx of Afib, heart rate is in control, on Metoprolol 2511mid.    Past Medical History:  Diagnosis Date  . Abnormal PFTs 10/30/2012   Followed in  Pulmonary clinic/ [facility]/ [place]  - PFT's 10/22/12 VC  55% no obst, DLCO 50%  - PFT's 12/20/2012 VC 83% and no obst, dLCO 55%  -11/08/2012  Walked RA x 3 laps @ 185 ft each stopped due to  End of study, not desat -11/08/12 esr 10    . Abnormality of gait 04/18/2013  . Acute on chronic systolic congestive heart failure, NYHA class 2 ([facility]/12/2006   BNP 376.9 05/28/13   . Adjustment disorder with mixed anxiety and depressed mood 04/18/2013   Post CVA depression and anxiety   . Allergy   . Anxiety   . Aortic aneurysm of unspecified site without mention of rupture 10/27/2011  . Arthritis   . Atherosclerosis of renal artery ([facility]/13/2008  . Atrial fibrillation ([facility]/28/2012    He was on coumadin until his stroke last December and was switched to apixaban 2.5mg46mily which he has been taking faithfully without reported side effects.    . Balance disorder 07/31/2014  . Basal cell carcinoma of skin of other and unspecified parts of face 12/24/2009  . Blood transfusion without reported diagnosis   . Cardiac pacemaker in situ- MDT 10/11 03/18/2010   Medtronic revo implant in October 2011. Severe sinus bradycardia in the 30s, but not truly pacemaker dependent   . Cataract   . CKD stage 2 due to type 2 diabetes mellitus (HCC)Adin28/2016  . Coronary artery disease   . CVA - Rt brain stroke 12/14 02/18/2013   02/19/13 angiography CT head:  Diffuse atherosclerotic irregularity and plaque formation of the distal right common carotid artery and proximal internal carotid  artery without significant stenosis. Plaque ulceration is present and is a source of emboli. A small right thalamic CVA    . DM (diabetes mellitus), type 2 with renal complications ([place]) 3/79/0240  . Dyslipidemia 11/03/2004  . Fall 03/04/2011  . Fatigue 04/18/2013  . Gout, unspecified 11/03/2004  . Heart murmur   . History of abdominal aortic aneurysm   . History of Doppler ultrasound 05/22/2012   LEAs; R anterior tibial artery  appeared occluded; L posterior tibial shows short segment of occlusive ds  . History of Doppler ultrasound 05/22/2012   Abdominal Aortic Doppler; slight increase in fusiform aneurysm   . History of echocardiogram 12/2008   EF >55%; mild concentric LVH; mild MR; mild-mod TR; mild AV regurg;   . History of nuclear stress test 12/2010   lexiscan; low risk; compared to prior study, perfusion improved  . HTN (hypertension) 03/04/2011  . Hyperlipidemia   . Hypertension   . Hypertrophy of prostate with urinary obstruction and other lower urinary tract symptoms (LUTS) 04/28/2004  . Hyponatremia 03/04/2011  . Hypothyroidism 03/04/2011  . Impotence of organic origin 10/03/2000  . Internal hemorrhoids without mention of complication 97/35/3299  . Long term (current) use of anticoagulants 03/17/2011  . Major depressive disorder, single episode, unspecified 03/05/2009  . Memory change 01/24/2013  . Muscle weakness (generalized) 03/10/2011  . Nocturia 10/27/2011  . Obstructive sleep apnea (adult) (pediatric) 07/23/2008  . Open wound of knee, leg (except thigh), and ankle, complicated 2/42/6834  . Osteoarthritis of both knees 07/31/2014  . Osteoarthrosis, unspecified whether generalized or localized, unspecified site 02/05/2003  . PAF (paroxysmal atrial fibrillation) (HCC)    coumadin  . Pain in joint, lower leg 08/04/2011  . Pruritic condition 01/24/2013  . Quadriceps weakness 07/31/2014  . Right bundle branch block 04/19/2006  . 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 Spinal stenosis in cervical region 12/15/2004  . SSS (sick sinus syndrome) ([place]) 04/08/2014  . Stroke ([place])   . Thyroid disease   . TIA (transient ischemic attack) 04/30/2013  . Type II or unspecified type diabetes mellitus without mention of complication, not stated as uncontrolled 11/24/2004  . Unspecified vitamin D deficiency 10/18/2006  . Xerophthalmia 02/25/2013   Droop of the right lower  eyelid. Increased exposure of the right cornea.    Past Surgical History:  Procedure Laterality Date  . CORONARY ANGIOPLASTY WITH STENT PLACEMENT  2008   stent to SVG to OM  . CORONARY ARTERY BYPASS GRAFT  1989  . EYE SURGERY    . HERNIA REPAIR    . INSERT / REPLACE / REMOVE PACEMAKER    . JOINT REPLACEMENT    . PACEMAKER INSERTION  2011    Allergies  Allergen Reactions  . Quinolones Other (See Comments)    Risk of rupture of AAA  . Caduet [Amlodipine-Atorvastatin] Other (See Comments)    Weakness   . Crestor [Rosuvastatin] Other (See Comments)    Muscle pain/weakness  . Lipitor [Atorvastatin Calcium] Other (See Comments)    Muscle pain/weakness  . Simvastatin Other (See Comments)    Muscle pain/weakness  . Norvasc [Amlodipine]   . Lisinopril Other (See Comments)    Doesn't remember reaction    Outpatient Encounter Medications as of 02/22/2017  Medication Sig  . allopurinol (ZYLOPRIM) 100 MG tablet Take 50 mg by mouth daily.   Marland Kitchen amoxicillin (AMOXIL) 500 MG capsule Take 500 mg by mouth. Take 4 caps prior to dental  procedures  . apixaban (ELIQUIS) 2.5 MG TABS tablet Take 1 tablet (2.5 mg total) by mouth 2 (two) times daily.  . carboxymethylcellulose (REFRESH PLUS) 0.5 % SOLN Place 1 drop into both eyes 3 (three) times daily. May also do daily as needed  . Cholecalciferol 1000 units tablet Take 2,000 Units by mouth daily.  . [EXPIRED] dextromethorphan 7.5 MG/5ML SYRP Take 10 mLs by mouth.  . furosemide (LASIX) 20 MG tablet Take 60 mg by mouth daily.   Marland Kitchen ipratropium (ATROVENT) 0.06 % nasal spray Place 1 spray into both nostrils 3 (three) times daily as needed.   Marland Kitchen ipratropium-albuterol (DUONEB) 0.5-2.5 (3) MG/3ML SOLN Take 3 mLs by nebulization 4 (four) times daily as needed.  Marland Kitchen levothyroxine (SYNTHROID, LEVOTHROID) 125 MCG tablet Take 125 mcg by mouth daily before breakfast.  . LORazepam (ATIVAN) 0.5 MG tablet Take 0.25 mg by mouth every 4 (four) hours as needed.   Marland Kitchen  losartan (COZAAR) 25 MG tablet Take 1 tablet (25 mg total) by mouth daily.  . meclizine (ANTIVERT) 25 MG tablet Take 25 mg by mouth every 6 (six) hours as needed.   . Menthol, Topical Analgesic, (BIOFREEZE) 4 % GEL Apply topically. Apply to left knee four times daily as needed for pain  . metoprolol tartrate (LOPRESSOR) 25 MG tablet Take 1 tablet (25 mg total) by mouth 2 (two) times daily.  . mupirocin nasal ointment (BACTROBAN) 2 % Place 1 application into the nose 2 (two) times daily as needed.  Marland Kitchen Neomycin-Bacitracin-Polymyxin (TRIPLE ANTIBIOTIC) [phone] OINT Apply 1 application topically daily as needed (apply to bilateral nostrils).  . nitroGLYCERIN (NITROSTAT) 0.4 MG SL tablet Place 0.4 mg under the tongue every 5 (five) minutes as needed for chest pain.   Marland Kitchen olopatadine (PATANOL) 0.1 % ophthalmic solution Place 1 drop into both eyes 2 (two) times daily as needed for allergies.  . OXYGEN Inhale 2 L into the lungs continuous.  . polyethylene glycol (MIRALAX / GLYCOLAX) packet Take 17 g by mouth daily as needed.   . RESOURCE BENEPROTEIN PO Take 15 mLs by mouth daily as needed.  . tamsulosin (FLOMAX) 0.4 MG CAPS capsule Take one cap by mouth daily  . tobramycin-dexamethasone (TOBRADEX) ophthalmic ointment Place 1 application into both eyes daily as needed.   . triamcinolone cream (KENALOG) 0.1 % Apply 1 application topically daily.  Marland Kitchen UNABLE TO FIND Med Name: Hydrocortisone cream with applicator 1%. Apply to hemorrhoids 2 times daily as needed  . Dextran 70-Hypromellose 0.1-0.3 % SOLN Apply 1 drop to eye 2 (two) times daily as needed.  . [DISCONTINUED] aspirin EC 81 MG tablet Take 81 mg by mouth daily.   No facility-administered encounter medications on file as of 02/22/2017.     Review of Systems  Constitutional: Positive for appetite change and fatigue. Negative for activity change, chills, diaphoresis and fever.       Seems more tired and less appetite for a couple of days.   HENT:  Positive for hearing loss. Negative for congestion, trouble swallowing and voice change.   Eyes: Negative for visual disturbance.  Respiratory: Positive for cough. Negative for apnea, choking, chest tightness, shortness of breath and wheezing.   Cardiovascular: Positive for leg swelling. Negative for chest pain and palpitations.  Gastrointestinal: Negative for abdominal distention, abdominal pain, constipation, diarrhea, nausea and vomiting.  Endocrine: Negative for cold intolerance.  Genitourinary: Negative for difficulty urinating, dysuria, frequency and urgency.       Incontinent of urine  Musculoskeletal: Positive for gait  problem. Negative for arthralgias, back pain and joint swelling.  Skin: Negative for color change, pallor and rash.  Neurological: Negative for tremors, speech difficulty, weakness and headaches.  Psychiatric/Behavioral: Negative for agitation, behavioral problems, confusion, hallucinations and sleep disturbance. The patient is not nervous/anxious.     Immunization History  Administered Date(s) Administered  . Influenza Whole 12/06/2011, 12/19/2012  . Influenza-Unspecified 01/09/2014, 11/25/2014, 12/23/2015, 12/26/2016  . PPD Test 05/03/2013  . Pneumococcal Conjugate-13 01/03/2017  . Pneumococcal Polysaccharide-23 03/07/1986  . Td 04/20/2005  . Tdap 01/03/2017   Pertinent  Health Maintenance Due  Topic Date Due  . OPHTHALMOLOGY EXAM  01/12/1932  . FOOT EXAM  07/31/2015  . HEMOGLOBIN A1C  05/17/2017  . PNA vac Low Risk Adult (2 of 2 - PPSV23) 01/03/2018  . INFLUENZA VACCINE  Completed   Fall Risk  11/15/2016 10/15/2015 06/11/2015 10/23/2014 07/07/2014  Falls in the past year? No No Yes Yes Yes  Number falls in past yr: - - _0 Injury with Fall? - - Yes - No  Risk for fall due to : - - - - History of fall(s);Impaired mobility;Impaired balance/gait  Follow up - - - - Falls evaluation completed;Falls prevention discussed   Functional Status Survey:    Vitals:     02/22/17 1200  BP: 130/70  Temp: 97.9 F (36.6 C)  SpO2: 96%  Weight: 159 lb 12.8 oz (72.5 kg)  Height: _1  (1.727 m)   Body mass index is 24.3 kg/m. Physical Exam  Constitutional: He is oriented to person, place, and time. He appears well-developed and well-nourished.  HENT:  Head: Normocephalic and atraumatic.  Eyes: Conjunctivae and EOM are normal. Pupils are equal, round, and reactive to light. Right eye exhibits no discharge. Left eye exhibits no discharge.  Neck: Normal range of motion. Neck supple. No JVD present. No thyromegaly present.  Cardiovascular: Normal rate.  Murmur heard. Pacemaker, irregular heart beats.   Pulmonary/Chest: Effort normal. He has no wheezes. He has rales.  Posterior mid to lower lung moist rales.   Abdominal: Soft. Bowel sounds are normal. He exhibits no distension. There is no tenderness. There is no rebound.  Musculoskeletal: Normal range of motion. He exhibits edema. He exhibits no tenderness.  Transfer self, w/c for mobility, trace edema BLE  Neurological: He is alert and oriented to person, place, and time. He exhibits abnormal muscle tone. Coordination normal.  Mild right facial weakness  Skin: Skin is warm and dry. No rash noted. No erythema.  Psychiatric: He has a normal mood and affect. His behavior is normal. Judgment and thought content normal.    Labs reviewed: Recent Labs    10/11/16 11/03/16 12/08/16  NA 134* 135* 134*  K 4.5 4.4 4.4  BUN 22* 23* 22*  CREATININE 1.0 1.0 1.0   Recent Labs    05/05/16 06/15/16 09/29/16  AST _2 ALT _3 ALKPHOS 98 82 78   Recent Labs    05/05/16 06/15/16 11/03/16  WBC 6.4 7.3 6.1  HGB 16.2 16.2 15.5  HCT 48 48 46  PLT 195 217 229   Lab Results  Component Value Date   TSH 1.69 11/03/2016   Lab Results  Component Value Date   HGBA1C 6.1 11/17/2016   Lab Results  Component Value Date   CHOL 130 10/18/2016   HDL 32 (A) 10/18/2016   LDLCALC 83 10/18/2016   TRIG 70  10/18/2016   CHOLHDL 3.9 05/01/2013    Significant  Diagnostic Results in last 30 days:  No results found.  Assessment/Plan Chronic systolic congestive heart failure (HCC) coughing, scant clear sputum production, but he denied chest pain, palpitation, dizziness. He is afebrile, no O2 desaturation. He admitted refusal of Furosemide for a couple of days. He has history of CHF, chronic leg edema, taking Furosemide 87m daily. Resume Furosemide 618mpo daily. May obtain CXR, CBC/diff, CMP if no better.      Enlarged prostate with lower urinary tract symptoms (LUTS)  seems more urinary incontinence than prior, taking Tamsulosin 0.22m60md, but he denied dysuria, lower abd or lower back pain. He is afebrile, no O2 desaturation. May obtain UA C/S if UTI symptoms develop.     Atrial fibrillation (HCC) Hx of Afib, heart rate is in control, on Metoprolol 42m40md.        Family/ staff Communication: plan of care reviewed with the patient and charge nurse  Labs/tests ordered:  None  Time spend 25 minutes.

## 2017-02-23 NOTE — Assessment & Plan Note (Signed)
seems more urinary incontinence than prior, taking Tamsulosin 0.4mg  qd, but he denied dysuria, lower abd or lower back pain. He is afebrile, no O2 desaturation. May obtain UA C/S if UTI symptoms develop.

## 2017-02-23 NOTE — Assessment & Plan Note (Signed)
coughing, scant clear sputum production, but he denied chest pain, palpitation, dizziness. He is afebrile, no O2 desaturation. He admitted refusal of Furosemide for a couple of days. He has history of CHF, chronic leg edema, taking Furosemide 60mg  daily. Resume Furosemide 60mg  po daily. May obtain CXR, CBC/diff, CMP if no better.

## 2017-02-23 NOTE — Assessment & Plan Note (Signed)
Hx of Afib, heart rate is in control, on Metoprolol 25mg  bid.

## 2017-03-11 DIAGNOSIS — M25532 Pain in left wrist: Secondary | ICD-10-CM | POA: Diagnosis not present

## 2017-03-12 LAB — HEPATIC FUNCTION PANEL
ALT: 10 (ref 10–40)
AST: 15 (ref 14–40)
Alkaline Phosphatase: 89 (ref 25–125)
BILIRUBIN, TOTAL: 1.8

## 2017-03-12 LAB — BASIC METABOLIC PANEL
BUN: 29 — AB (ref 4–21)
Creatinine: 1.1 (ref <=1.3)
Glucose: 157
Potassium: 4 (ref 3.4–5.3)
SODIUM: 132 — AB (ref 137–147)

## 2017-03-12 LAB — CBC AND DIFFERENTIAL
HEMATOCRIT: 43 (ref 41–53)
HEMOGLOBIN: 14.7 (ref 13.5–17.5)
Platelets: 225 (ref 150–399)
WBC: 11

## 2017-03-13 ENCOUNTER — Other Ambulatory Visit: Payer: Self-pay | Admitting: *Deleted

## 2017-03-13 ENCOUNTER — Non-Acute Institutional Stay: Payer: Medicare Other | Admitting: Nurse Practitioner

## 2017-03-13 ENCOUNTER — Encounter: Payer: Self-pay | Admitting: Nurse Practitioner

## 2017-03-13 DIAGNOSIS — M1000 Idiopathic gout, unspecified site: Secondary | ICD-10-CM | POA: Diagnosis not present

## 2017-03-13 DIAGNOSIS — J069 Acute upper respiratory infection, unspecified: Secondary | ICD-10-CM | POA: Diagnosis not present

## 2017-03-13 NOTE — Assessment & Plan Note (Signed)
Resolved pain redness swelling of the left wrist 03/11/17 after Tylenol. 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 He has history of gout, continue Allopurinol 50mg  qd, uric acid level 7.5 03/12/17.

## 2017-03-13 NOTE — Progress Notes (Signed)
Location:  [facility] [place] Room Number: [place] of Service:  ALF [phone]) Provider:  Titiana Severa, Manxie  NP  Blanchie Serve, MD  Patient Care Team: Blanchie Serve, MD as PCP - General (Internal Medicine) [place], Nadean Corwin, MD as Consulting Physician (Cardiology) Ardis Hughs, MD as Attending Physician (Urology) Broadus Costilla X, NP as Nurse Practitioner (Internal Medicine)  Extended Emergency Contact Information Primary Emergency Contact: [name] of [place] Phone: [phone] Relation: Daughter Secondary Emergency Contact: Eyvonne Mechanic [place] of [place] Phone: [phone] Mobile Phone: [phone] Relation: Daughter  Code Status: DNR  Goals of care: Advanced Directive information Advanced Directives 03/13/2017  Does Patient Have a Medical Advance Directive? Yes  Type of Paramedic of [place];Living will;Out of facility DNR (pink MOST or yellow form)  Does patient want to make changes to medical advance directive? No - Patient declined  Copy of [place] in Chart? Yes  Pre-existing out of facility DNR order (yellow form or pink MOST form) Yellow form placed in chart (order not valid for inpatient use)     Chief Complaint  Patient presents with  . Acute Visit    nasal congestion and cough    HPI:  Pt is a 84 y.o. male seen today for an acute visit for nasal congestion, running nose, cough, occasional while mucous production, he stated he felt feverish,  for 2 days. He denied SOB, chest pain/pressure, palpitation, nausea, vomiting, or diarrhea. Resolved pain redness swelling of the left wrist 03/11/17 after Tylenol. 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 He has history of gout, on Allopurinol '50mg'$  qd, uric acid level 7.5 03/12/17.   Past Medical History:  Diagnosis Date  .  Abnormal PFTs 10/30/2012   Followed in Pulmonary clinic/ [facility]/ [place]  - PFT's 10/22/12 VC  55% no obst, DLCO 50%  - PFT's 12/20/2012 VC 83% and no obst, dLCO 55%  -11/08/2012  Walked RA x 3 laps @ 185 ft each stopped due to  End of study, not desat -11/08/12 esr 10    . Abnormality of gait 04/18/2013  . Acute on chronic systolic congestive heart failure, NYHA class 2 ([place]) 03/16/2006   BNP 376.9 05/28/13   . Adjustment disorder with mixed anxiety and depressed mood 04/18/2013   Post CVA depression and anxiety   . Allergy   . Anxiety   . Aortic aneurysm of unspecified site without mention of rupture 10/27/2011  . Arthritis   . Atherosclerosis of renal artery ([place]) 10/18/2006  . Atrial fibrillation ([place]) 03/04/2011    He was on coumadin until his stroke last December and was switched to apixaban 2.'5mg'$  daily which he has been taking faithfully without reported side effects.    . Balance disorder 07/31/2014  . Basal cell carcinoma of skin of other and unspecified parts of face 12/24/2009  . Blood transfusion without reported diagnosis   . Cardiac pacemaker in situ- MDT 10/11 03/18/2010   Medtronic revo implant in October 2011. Severe sinus bradycardia in the 30s, but not truly pacemaker dependent   . Cataract   . CKD stage 2 due to type 2 diabetes mellitus ([place]) 07/03/2014  . Coronary artery disease   . CVA - Rt brain stroke 12/14 02/18/2013   02/19/13 angiography CT head:  Diffuse atherosclerotic irregularity and plaque formation of the distal right common carotid artery and proximal internal carotid artery without  significant stenosis. Plaque ulceration is present and is a source of emboli. A small right thalamic CVA    . DM (diabetes mellitus), type 2 with renal complications ([place]) 07/07/7739  . Dyslipidemia 11/03/2004  . Fall 03/04/2011  . Fatigue 04/18/2013  . Gout, unspecified 11/03/2004  . Heart murmur   . History of abdominal aortic aneurysm   . History of Doppler ultrasound 05/22/2012    LEAs; R anterior tibial artery appeared occluded; L posterior tibial shows short segment of occlusive ds  . History of Doppler ultrasound 05/22/2012   Abdominal Aortic Doppler; slight increase in fusiform aneurysm   . History of echocardiogram 12/2008   EF >55%; mild concentric LVH; mild MR; mild-mod TR; mild AV regurg;   . History of nuclear stress test 12/2010   lexiscan; low risk; compared to prior study, perfusion improved  . HTN (hypertension) 03/04/2011  . Hyperlipidemia   . Hypertension   . Hypertrophy of prostate with urinary obstruction and other lower urinary tract symptoms (LUTS) 04/28/2004  . Hyponatremia 03/04/2011  . Hypothyroidism 03/04/2011  . Impotence of organic origin 10/03/2000  . Internal hemorrhoids without mention of complication 28/78/6767  . Long term (current) use of anticoagulants 03/17/2011  . Major depressive disorder, single episode, unspecified 03/05/2009  . Memory change 01/24/2013  . Muscle weakness (generalized) 03/10/2011  . Nocturia 10/27/2011  . Obstructive sleep apnea (adult) (pediatric) 07/23/2008  . Open wound of knee, leg (except thigh), and ankle, complicated 04/16/4707  . Osteoarthritis of both knees 07/31/2014  . Osteoarthrosis, unspecified whether generalized or localized, unspecified site 02/05/2003  . PAF (paroxysmal atrial fibrillation) (H[facility])    coumadin  . Pain in joint, lower leg 08/04/2011  . Pruritic condition 01/24/2013  . Quadriceps weakness 07/31/2014  . Right bundle branch block 04/19/2006  . 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 Spinal stenosis in cervical region 12/15/2004  . SSS (sick sinus syndrome) ([place]) 04/08/2014  . Stroke ([place])   . Thyroid disease   . TIA (transient ischemic attack) 04/30/2013  . Type II or unspecified type diabetes mellitus without mention of complication, not stated as uncontrolled 11/24/2004  . Unspecified vitamin D deficiency 10/18/2006  . Xerophthalmia  02/25/2013   Droop of the right lower eyelid. Increased exposure of the right cornea.    Past Surgical History:  Procedure Laterality Date  . CORONARY ANGIOPLASTY WITH STENT PLACEMENT  2008   stent to SVG to OM  . CORONARY ARTERY BYPASS GRAFT  1989  . EYE SURGERY    . HERNIA REPAIR    . INSERT / REPLACE / REMOVE PACEMAKER    . JOINT REPLACEMENT    . PACEMAKER INSERTION  2011    Allergies  Allergen Reactions  . Quinolones Other (See Comments)    Risk of rupture of AAA  . Caduet [Amlodipine-Atorvastatin] Other (See Comments)    Weakness   . Crestor [Rosuvastatin] Other (See Comments)    Muscle pain/weakness  . Lipitor [Atorvastatin Calcium] Other (See Comments)    Muscle pain/weakness  . Simvastatin Other (See Comments)    Muscle pain/weakness  . Norvasc [Amlodipine]   . Lisinopril Other (See Comments)    Doesn't remember reaction    Outpatient Encounter Medications as of 03/13/2017  Medication Sig  . acetaminophen (TYLENOL) 325 MG tablet Take 650 mg by mouth every 4 (four) hours as needed for mild pain (PRN for minor pain/headache x 48 hours. Notify MD of continued pain/headache. Do  not exceed 3,000 mg in 24 hours. Document area of discomfort and effectiveness of medication.).  Marland Kitchen allopurinol (ZYLOPRIM) 100 MG tablet Take 50 mg by mouth daily.   Marland Kitchen amoxicillin (AMOXIL) 500 MG capsule Take 500 mg by mouth. Take 4 caps prior to dental procedures  . apixaban (ELIQUIS) 2.5 MG TABS tablet Take 1 tablet (2.5 mg total) by mouth 2 (two) times daily.  . carboxymethylcellulose (REFRESH PLUS) 0.5 % SOLN Place 1 drop into both eyes 3 (three) times daily. May also do daily as needed  . Cholecalciferol 1000 units tablet Take 2,000 Units by mouth daily.  Marland Kitchen Dextran 70-Hypromellose 0.1-0.3 % SOLN Apply 1 drop to eye 2 (two) times daily as needed.  Marland Kitchen dextromethorphan 7.5 MG/5ML SYRP Take 10 mLs by mouth every 6 (six) hours. Notify MD is cough continues.  . furosemide (LASIX) 20 MG tablet Take 60  mg by mouth daily.   Marland Kitchen ipratropium-albuterol (DUONEB) 0.5-2.5 (3) MG/3ML SOLN Take 3 mLs by nebulization 4 (four) times daily as needed.  Marland Kitchen levothyroxine (SYNTHROID, LEVOTHROID) 125 MCG tablet Take 125 mcg by mouth daily before breakfast.  . losartan (COZAAR) 25 MG tablet Take 1 tablet (25 mg total) by mouth daily.  . meclizine (ANTIVERT) 25 MG tablet Take 25 mg by mouth every 6 (six) hours as needed.   . Menthol, Topical Analgesic, (BIOFREEZE) 4 % GEL Apply topically. Apply to left knee four times daily as needed for pain  . metoprolol tartrate (LOPRESSOR) 25 MG tablet Take 1 tablet (25 mg total) by mouth 2 (two) times daily.  Marland Kitchen Neomycin-Bacitracin-Polymyxin (TRIPLE ANTIBIOTIC) [phone] OINT Apply 1 application topically daily as needed (apply to bilateral nostrils).  . nitroGLYCERIN (NITROSTAT) 0.4 MG SL tablet Place 0.4 mg under the tongue every 5 (five) minutes as needed for chest pain.   Marland Kitchen olopatadine (PATANOL) 0.1 % ophthalmic solution Place 1 drop into both eyes 2 (two) times daily as needed for allergies.  . OXYGEN Inhale 2 L into the lungs continuous.  . polyethylene glycol (MIRALAX / GLYCOLAX) packet Take 17 g by mouth daily as needed.   . RESOURCE BENEPROTEIN PO Take 15 mLs by mouth daily as needed.  . tamsulosin (FLOMAX) 0.4 MG CAPS capsule Take one cap by mouth daily  . tobramycin-dexamethasone (TOBRADEX) ophthalmic ointment Place 1 application into both eyes daily as needed.   . triamcinolone cream (KENALOG) 0.1 % Apply 1 application topically daily.  Marland Kitchen UNABLE TO FIND Med Name: Hydrocortisone cream with applicator 1%. Apply to hemorrhoids 2 times daily as needed  . [DISCONTINUED] ipratropium (ATROVENT) 0.06 % nasal spray Place 1 spray into both nostrils 3 (three) times daily as needed.   . [DISCONTINUED] LORazepam (ATIVAN) 0.5 MG tablet Take 0.25 mg by mouth every 4 (four) hours as needed.   . [DISCONTINUED] mupirocin nasal ointment (BACTROBAN) 2 % Place 1 application into the  nose 2 (two) times daily as needed.   No facility-administered encounter medications on file as of 03/13/2017.     Review of Systems  Constitutional: Positive for fatigue. Negative for activity change, appetite change, chills, diaphoresis and fever.  HENT: Positive for congestion, hearing loss and rhinorrhea. Negative for ear pain, facial swelling, mouth sores, nosebleeds, postnasal drip, sinus pressure, sinus pain, sneezing, sore throat, tinnitus, trouble swallowing and voice change.   Respiratory: Positive for cough. Negative for choking, chest tightness, shortness of breath and wheezing.   Cardiovascular: Negative for chest pain, palpitations and leg swelling.  Gastrointestinal: Negative for abdominal distention, abdominal pain,  constipation, diarrhea, nausea and vomiting.  Musculoskeletal: Positive for gait problem and joint swelling.  Skin: Negative for color change, pallor, rash and wound.  Allergic/Immunologic: Negative for environmental allergies and food allergies.  Neurological: Negative for tremors and headaches.  Psychiatric/Behavioral: Negative for agitation, behavioral problems, confusion and sleep disturbance. The patient is not nervous/anxious.     Immunization History  Administered Date(s) Administered  . Influenza Whole 12/06/2011, 12/19/2012  . Influenza-Unspecified 01/09/2014, 11/25/2014, 12/23/2015, 12/26/2016  . PPD Test 05/03/2013  . Pneumococcal Conjugate-13 01/03/2017  . Pneumococcal Polysaccharide-23 03/07/1986  . Td 04/20/2005  . Tdap 01/03/2017   Pertinent  Health Maintenance Due  Topic Date Due  . OPHTHALMOLOGY EXAM  01/12/1932  . FOOT EXAM  07/31/2015  . HEMOGLOBIN A1C  05/17/2017  . PNA vac Low Risk Adult (2 of 2 - PPSV23) 01/03/2018  . INFLUENZA VACCINE  Completed   Fall Risk  11/15/2016 10/15/2015 06/11/2015 10/23/2014 07/07/2014  Falls in the past year? No No Yes Yes Yes  Number falls in past yr: - - '1 1 1  '$ Injury with Fall? - - Yes - No  Risk for fall  due to : - - - - History of fall(s);Impaired mobility;Impaired balance/gait  Follow up - - - - Falls evaluation completed;Falls prevention discussed   Functional Status Survey:    Vitals:   03/13/17 1114  BP: 130/72  Pulse: 74  Resp: 20  Temp: 99.7 F (37.6 C)  SpO2: 91%  Weight: 158 lb (71.7 kg)  Height: '5\' 8"'$  (1.727 m)   Body mass index is 24.02 kg/m. Physical Exam  Constitutional: He is oriented to person, place, and time. He appears well-developed and well-nourished.  HENT:  Head: Normocephalic and atraumatic.  Mouth/Throat: Oropharynx is clear and moist. No oropharyngeal exudate.  Eyes: Conjunctivae and EOM are normal. Pupils are equal, round, and reactive to light.  Neck: Normal range of motion. Neck supple. No JVD present. No thyromegaly present.  Cardiovascular: Normal rate and regular rhythm.  Murmur heard. Pulmonary/Chest: Effort normal. No respiratory distress. He has no wheezes. He has rales. He exhibits no tenderness.  The posterior right mid to lower lung rales.   Abdominal: Soft. Bowel sounds are normal. He exhibits no distension. There is no tenderness.  Musculoskeletal: Normal range of motion. He exhibits no edema or tenderness.  Resolved left wrist pain 03/11/17  Neurological: He is alert and oriented to person, place, and time. He exhibits normal muscle tone. Coordination normal.  Skin: Skin is warm and dry. No rash noted. No erythema.  Psychiatric: He has a normal mood and affect. His behavior is normal.    Labs reviewed: Recent Labs    11/03/16 12/08/16 03/12/17  NA 135* 134* 132*  K 4.4 4.4 4.0  BUN 23* 22* 29*  CREATININE 1.0 1.0 1.1   Recent Labs    06/15/16 09/29/16 03/12/17  AST '23 17 15  '$ ALT '16 11 10  '$ ALKPHOS 82 78 89   Recent Labs    06/15/16 11/03/16 03/12/17  WBC 7.3 6.1 11.0  HGB 16.2 15.5 14.7  HCT 48 46 43  PLT 217 229 225   Lab Results  Component Value Date   TSH 1.69 11/03/2016   Lab Results  Component Value Date    HGBA1C 6.1 11/17/2016   Lab Results  Component Value Date   CHOL 130 10/18/2016   HDL 32 (A) 10/18/2016   LDLCALC 83 10/18/2016   TRIG 70 10/18/2016   CHOLHDL 3.9 05/01/2013  Significant Diagnostic Results in last 30 days:  No results found.  Assessment/Plan Upper respiratory infection nasal congestion, running nose, cough, occasional while mucous production, he stated he felt feverish,  for 2 days. He denied SOB, chest pain/pressure, palpitation, nausea, vomiting, or diarrhea. Will obtain CXR to rule out PNA. Try Claritin '10mg'$  qd, Mucinex '600mg'$  po bid x 7 days. 03/12/17 wbc 11.0, Hgb 14.7, plt 225   Gout Resolved pain redness swelling of the left wrist 03/11/17 after Tylenol. 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 He has history of gout, continue Allopurinol '50mg'$  qd, uric acid level 7.5 03/12/17.     Family/ staff Communication: plan of care reviewed with the patient and charge nurse  Labs/tests ordered:  CXR   Time spend 25 minutes.

## 2017-03-13 NOTE — Assessment & Plan Note (Signed)
nasal congestion, running nose, cough, occasional while mucous production, he stated he felt feverish,  for 2 days. He denied SOB, chest pain/pressure, palpitation, nausea, vomiting, or diarrhea. Will obtain CXR to rule out PNA. Try Claritin 10mg  qd, Mucinex 600mg  po bid x 7 days. 03/12/17 wbc 11.0, Hgb 14.7, plt 225

## 2017-03-14 DIAGNOSIS — R05 Cough: Secondary | ICD-10-CM | POA: Diagnosis not present

## 2017-03-15 ENCOUNTER — Encounter: Payer: Self-pay | Admitting: Nurse Practitioner

## 2017-03-15 ENCOUNTER — Non-Acute Institutional Stay: Payer: Medicare Other | Admitting: Nurse Practitioner

## 2017-03-15 DIAGNOSIS — Z7189 Other specified counseling: Secondary | ICD-10-CM

## 2017-03-15 DIAGNOSIS — F4323 Adjustment disorder with mixed anxiety and depressed mood: Secondary | ICD-10-CM

## 2017-03-15 DIAGNOSIS — J069 Acute upper respiratory infection, unspecified: Secondary | ICD-10-CM

## 2017-03-15 NOTE — Assessment & Plan Note (Signed)
Fatigue and poor appetite for 3-4 days when URI was onset, MOST discussion reveal his goal of ABT/Fluids as indicated, will update CBC/diff, BMP in morning. Observe.

## 2017-03-15 NOTE — Assessment & Plan Note (Signed)
Goals of care discussion: reviewed goals of care with the patient, his POA daughter. Social Network engineer also present. DNR from was reviewed and placed in file. Went over and filled out MOST form between 2:20 pm to 2:45pm. The patient would like to continue to be DNR. He would like to transfer to hospital if indicated, avoid intensive care with limited additional interventions. He agrees to antibiotics if indicated, fluids if indicated, and feeding tube fro a defined trial period. Form signed by the patient, social worker, and myself. Copies made for the chart and family. Original cope was placed in the patient's chart.

## 2017-03-15 NOTE — Progress Notes (Signed)
Location:  [facility] Room Number: [place] of Service:  ALF [phone]) Provider:  Rashia Mckesson, [place]  NP  Blanchie Serve, MD  Patient Care Team: Blanchie Serve, MD as PCP - General (Internal Medicine) [place], Nadean Corwin, MD as Consulting Physician (Cardiology) Ardis Hughs, MD as Attending Physician (Urology) Jojuan Champney X, NP as Nurse Practitioner (Internal Medicine)  Extended Emergency Contact Information Primary Emergency Contact: [name] of [place] Phone: [phone] Relation: Daughter Secondary Emergency Contact: Eyvonne Mechanic [place] of [place] Phone: [phone] Mobile Phone: [phone] Relation: Daughter  Code Status: DNR Goals of care: Advanced Directive information Advanced Directives 03/13/2017  Does Patient Have a Medical Advance Directive? Yes  Type of Paramedic of [place];Living will;Out of facility DNR (pink MOST or yellow form)  Does patient want to make changes to medical advance directive? No - Patient declined  Copy of [place] in Chart? Yes  Pre-existing out of facility DNR order (yellow form or pink MOST form) Yellow form placed in chart (order not valid for inpatient use)     Chief Complaint  Patient presents with  . Acute Visit    F/U Upper respiratory infection, ACP meeting.    HPI:  Pt is a 84 y.o. male seen today for an acute visit for the patient's complaining of persisted nasal congestion, non productive cough, on Claritin and Mucinex. The patient denied fever, headache, SOB, chest pain/pressures, palpitation, nausea, vomiting, constipation/diarrhea. He admitted his appetite has been poor, he is weaker. No O2 desaturation, he is afebrile. CXR 03/14/17 showed no evidence of acute pulmonary disease. He stated he is not sleeping well at night since the onset of URI. Hx of anxiety/depressed mood, off Lorazepam since  06/09/16   The patient and his POA daughter presented for goals of care discussion.    Past Medical History:  Diagnosis Date  . Abnormal PFTs 10/30/2012   Followed in Pulmonary clinic/ [facility]/ [place]  - PFT's 10/22/12 VC  55% no obst, DLCO 50%  - PFT's 12/20/2012 VC 83% and no obst, dLCO 55%  -11/08/2012  Walked RA x 3 laps @ 185 ft each stopped due to  End of study, not desat -11/08/12 esr 10    . Abnormality of gait 04/18/2013  . Acute on chronic systolic congestive heart failure, NYHA class 2 ([place]) 03/16/2006   BNP 376.9 05/28/13   . Adjustment disorder with mixed anxiety and depressed mood 04/18/2013   Post CVA depression and anxiety   . Allergy   . Anxiety   . Aortic aneurysm of unspecified site without mention of rupture 10/27/2011  . Arthritis   . Atherosclerosis of renal artery ([place]) 10/18/2006  . Atrial fibrillation ([place]) 03/04/2011    He was on coumadin until his stroke last December and was switched to apixaban 2.55m daily which he has been taking faithfully without reported side effects.    . Balance disorder 07/31/2014  . Basal cell carcinoma of skin of other and unspecified parts of face 12/24/2009  . Blood transfusion without reported diagnosis   . Cardiac pacemaker in situ- MDT 10/11 03/18/2010   Medtronic revo implant in October 2011. Severe sinus bradycardia in the 30s, but not truly pacemaker dependent   . Cataract   . CKD stage 2 due to type 2 diabetes mellitus ([place] [place] 07/03/2014  . Coronary artery disease   . CVA - Rt brain stroke 12/14 02/18/2013   02/19/13 angiography CT  head:  Diffuse atherosclerotic irregularity and plaque formation of the distal right common carotid artery and proximal internal carotid artery without significant stenosis. Plaque ulceration is present and is a source of emboli. A small right thalamic CVA    . DM (diabetes mellitus), type 2 with renal complications ([place]) 09/29/2033  . Dyslipidemia 11/03/2004  . Fall 03/04/2011  . Fatigue 04/18/2013  .  Gout, unspecified 11/03/2004  . Heart murmur   . History of abdominal aortic aneurysm   . History of Doppler ultrasound 05/22/2012   LEAs; R anterior tibial artery appeared occluded; L posterior tibial shows short segment of occlusive ds  . History of Doppler ultrasound 05/22/2012   Abdominal Aortic Doppler; slight increase in fusiform aneurysm   . History of echocardiogram 12/2008   EF >55%; mild concentric LVH; mild MR; mild-mod TR; mild AV regurg;   . History of nuclear stress test 12/2010   lexiscan; low risk; compared to prior study, perfusion improved  . HTN (hypertension) 03/04/2011  . Hyperlipidemia   . Hypertension   . Hypertrophy of prostate with urinary obstruction and other lower urinary tract symptoms (LUTS) 04/28/2004  . Hyponatremia 03/04/2011  . Hypothyroidism 03/04/2011  . Impotence of organic origin 10/03/2000  . Internal hemorrhoids without mention of complication 59/74/1638  . Long term (current) use of anticoagulants 03/17/2011  . Major depressive disorder, single episode, unspecified 03/05/2009  . Memory change 01/24/2013  . Muscle weakness (generalized) 03/10/2011  . Nocturia 10/27/2011  . Obstructive sleep apnea (adult) (pediatric) 07/23/2008  . Open wound of knee, leg (except thigh), and ankle, complicated 4/53/6468  . Osteoarthritis of both knees 07/31/2014  . Osteoarthrosis, unspecified whether generalized or localized, unspecified site 02/05/2003  . PAF (paroxysmal atrial fibrillation) (HCC)    coumadin  . Pain in joint, lower leg 08/04/2011  . Pruritic condition 01/24/2013  . Quadriceps weakness 07/31/2014  . Right bundle branch block 04/19/2006  . 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 Spinal stenosis in cervical region 12/15/2004  . SSS (sick sinus syndrome) ([place]) 04/08/2014  . Stroke ([place])   . Thyroid disease   . TIA (transient ischemic attack) 04/30/2013  . Type II or unspecified type diabetes mellitus without  mention of complication, not stated as uncontrolled 11/24/2004  . Unspecified vitamin D deficiency 10/18/2006  . 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 Increased exposure of the right cornea.    Past Surgical History:  Procedure Laterality Date  . CORONARY ANGIOPLASTY WITH STENT PLACEMENT  2008   stent to SVG to OM  . CORONARY ARTERY BYPASS GRAFT  1989  . EYE SURGERY    . HERNIA REPAIR    . INSERT / REPLACE / REMOVE PACEMAKER    . JOINT REPLACEMENT    . PACEMAKER INSERTION  2011    Allergies  Allergen Reactions  . Quinolones Other (See Comments)    Risk of rupture of AAA  . Caduet [Amlodipine-Atorvastatin] Other (See Comments)    Weakness   . Crestor [Rosuvastatin] Other (See Comments)    Muscle pain/weakness  . Lipitor [Atorvastatin Calcium] Other (See Comments)    Muscle pain/weakness  . Simvastatin Other (See Comments)    Muscle pain/weakness  . Norvasc [Amlodipine]   . Lisinopril Other (See Comments)    Doesn't remember reaction    Outpatient Encounter Medications as of 03/15/2017  Medication Sig  . acetaminophen (TYLENOL) 325 MG tablet Take 650 mg by mouth every  4 (four) hours as needed for mild pain (PRN for minor pain/headache x 48 hours. Notify MD of continued pain/headache. Do not exceed 3,000 mg in 24 hours. Document area of discomfort and effectiveness of medication.).  Marland Kitchen allopurinol (ZYLOPRIM) 100 MG tablet Take 50 mg by mouth daily.   Marland Kitchen amoxicillin (AMOXIL) 500 MG capsule Take 500 mg by mouth. Take 4 caps prior to dental procedures  . apixaban (ELIQUIS) 2.5 MG TABS tablet Take 1 tablet (2.5 mg total) by mouth 2 (two) times daily.  . carboxymethylcellulose (REFRESH PLUS) 0.5 % SOLN Place 1 drop into both eyes 3 (three) times daily. May also do daily as needed  . Cholecalciferol 1000 units tablet Take 2,000 Units by mouth daily.  Marland Kitchen Dextran 70-Hypromellose 0.1-0.3 % SOLN Apply 1 drop to eye 2 (two) times daily as needed.  Marland Kitchen dextromethorphan 7.5  MG/5ML SYRP Take 10 mLs by mouth every 6 (six) hours. Notify MD is cough continues.  . furosemide (LASIX) 20 MG tablet Take 60 mg by mouth daily.   Marland Kitchen guaiFENesin (MUCINEX) 600 MG 12 hr tablet Take 600 mg by mouth 2 (two) times daily.  Marland Kitchen ipratropium-albuterol (DUONEB) 0.5-2.5 (3) MG/3ML SOLN Take 3 mLs by nebulization 4 (four) times daily as needed.  Marland Kitchen levothyroxine (SYNTHROID, LEVOTHROID) 125 MCG tablet Take 125 mcg by mouth daily before breakfast.  . losartan (COZAAR) 25 MG tablet Take 1 tablet (25 mg total) by mouth daily.  . meclizine (ANTIVERT) 25 MG tablet Take 25 mg by mouth every 6 (six) hours as needed.   . Menthol, Topical Analgesic, (BIOFREEZE) 4 % GEL Apply topically. Apply to left knee four times daily as needed for pain  . metoprolol tartrate (LOPRESSOR) 25 MG tablet Take 1 tablet (25 mg total) by mouth 2 (two) times daily.  Marland Kitchen Neomycin-Bacitracin-Polymyxin (TRIPLE ANTIBIOTIC) [phone] OINT Apply 1 application topically daily as needed (apply to bilateral nostrils).  . nitroGLYCERIN (NITROSTAT) 0.4 MG SL tablet Place 0.4 mg under the tongue every 5 (five) minutes as needed for chest pain.   Marland Kitchen olopatadine (PATANOL) 0.1 % ophthalmic solution Place 1 drop into both eyes 2 (two) times daily as needed for allergies.  . OXYGEN Inhale 2 L into the lungs continuous.  . polyethylene glycol (MIRALAX / GLYCOLAX) packet Take 17 g by mouth daily as needed.   . tamsulosin (FLOMAX) 0.4 MG CAPS capsule Take one cap by mouth daily  . tobramycin-dexamethasone (TOBRADEX) ophthalmic ointment Place 1 application into both eyes daily as needed.   . triamcinolone cream (KENALOG) 0.1 % Apply 1 application topically daily.  Marland Kitchen UNABLE TO FIND Med Name: Hydrocortisone cream with applicator 1%. Apply to hemorrhoids 2 times daily as needed  . [DISCONTINUED] RESOURCE BENEPROTEIN PO Take 15 mLs by mouth daily as needed.   No facility-administered encounter medications on file as of 03/15/2017.     Review of  Systems  Constitutional: Positive for activity change, appetite change and fatigue. Negative for chills, diaphoresis and fever.  HENT: Positive for congestion and hearing loss. Negative for trouble swallowing and voice change.   Eyes: Negative for visual disturbance.  Respiratory: Positive for cough. Negative for chest tightness, shortness of breath and wheezing.   Cardiovascular: Positive for leg swelling. Negative for palpitations.  Gastrointestinal: Negative for abdominal distention, abdominal pain, constipation, diarrhea, nausea and vomiting.  Genitourinary: Negative for difficulty urinating, dysuria and urgency.       Incontinent of urine  Musculoskeletal: Positive for gait problem. Negative for back pain.  Skin: Negative  for color change, pallor and rash.  Neurological: Negative for tremors, speech difficulty, weakness and headaches.  Psychiatric/Behavioral: Positive for sleep disturbance. Negative for agitation, behavioral problems, confusion and hallucinations. The patient is not nervous/anxious.     Immunization History  Administered Date(s) Administered  . Influenza Whole 12/06/2011, 12/19/2012  . Influenza-Unspecified 01/09/2014, 11/25/2014, 12/23/2015, 12/26/2016  . PPD Test 05/03/2013  . Pneumococcal Conjugate-13 01/03/2017  . Pneumococcal Polysaccharide-23 03/07/1986  . Td 04/20/2005  . Tdap 01/03/2017   Pertinent  Health Maintenance Due  Topic Date Due  . OPHTHALMOLOGY EXAM  01/12/1932  . FOOT EXAM  07/31/2015  . HEMOGLOBIN A1C  05/17/2017  . PNA vac Low Risk Adult (2 of 2 - PPSV23) 01/03/2018  . INFLUENZA VACCINE  Completed   Fall Risk  11/15/2016 10/15/2015 06/11/2015 10/23/2014 07/07/2014  Falls in the past year? No No Yes Yes Yes  Number falls in past yr: - - _0 Injury with Fall? - - Yes - No  Risk for fall due to : - - - - History of fall(s);Impaired mobility;Impaired balance/gait  Follow up - - - - Falls evaluation completed;Falls prevention discussed    Functional Status Survey:    Vitals:   03/15/17 1301  BP: 120/60  Pulse: 74  Resp: 20  Temp: 99.7 F (37.6 C)  SpO2: 96%  Weight: 158 lb (71.7 kg)  Height: _1  (1.727 m)   Body mass index is 24.02 kg/m. Physical Exam  Constitutional: He is oriented to person, place, and time. He appears well-developed and well-nourished. No distress.  HENT:  Head: Normocephalic and atraumatic.  Hearing aids.   Eyes: Conjunctivae are normal. Pupils are equal, round, and reactive to light. Right eye exhibits no discharge. Left eye exhibits no discharge.  Neck: Normal range of motion. Neck supple. No JVD present. No thyromegaly present.  Cardiovascular: Normal rate and regular rhythm.  Murmur heard. Pulmonary/Chest: He has no wheezes. He has rales.  Rales in the tight posterior lower lung field.   Abdominal: Soft. Bowel sounds are normal. He exhibits no distension. There is no tenderness.  Musculoskeletal: He exhibits edema. He exhibits no tenderness.  Trace edema in ankles. Transfers self. Electric wheelchair to get around.   Neurological: He is alert and oriented to person, place, and time. He displays normal reflexes. He exhibits normal muscle tone. Coordination normal.  Skin: Skin is warm and dry. No rash noted. He is not diaphoretic. No erythema.  Psychiatric: He has a normal mood and affect. His behavior is normal. Judgment and thought content normal.    Labs reviewed: Recent Labs    11/03/16 12/08/16 03/12/17  NA 135* 134* 132*  K 4.4 4.4 4.0  BUN 23* 22* 29*  CREATININE 1.0 1.0 1.1   Recent Labs    06/15/16 09/29/16 03/12/17  AST _2 ALT _3 ALKPHOS 82 78 89   Recent Labs    06/15/16 11/03/16 03/12/17  WBC 7.3 6.1 11.0  HGB 16.2 15.5 14.7  HCT 48 46 43  PLT 217 229 225   Lab Results  Component Value Date   TSH 1.69 11/03/2016   Lab Results  Component Value Date   HGBA1C 6.1 11/17/2016   Lab Results  Component Value Date   CHOL 130 10/18/2016    HDL 32 (A) 10/18/2016   LDLCALC 83 10/18/2016   TRIG 70 10/18/2016   CHOLHDL 3.9 05/01/2013    Significant Diagnostic Results in last 30 days:  No  results found.  Assessment/Plan Counseling regarding advanced care planning and goals of care Goals of care discussion: reviewed goals of care with the patient, his POA daughter. Social Network engineer also present. DNR from was reviewed and placed in file. Went over and filled out MOST form between 2:20 pm to 2:45pm. The patient would like to continue to be DNR. He would like to transfer to hospital if indicated, avoid intensive care with limited additional interventions. He agrees to antibiotics if indicated, fluids if indicated, and feeding tube fro a defined trial period. Form signed by the patient, social worker, and myself. Copies made for the chart and family. Original cope was placed in the patient's chart.   Generalized weakness Fatigue and poor appetite for 3-4 days when URI was onset, MOST discussion reveal his goal of ABT/Fluids as indicated, will update CBC/diff, BMP in morning. Observe.   Upper respiratory infection patient's complaining of persisted nasal congestion, non productive cough, on Claritin and Mucinex. The patient denied fever, headache, SOB, chest pain/pressures, palpitation, nausea, vomiting, constipation/diarrhea. He admitted his appetite has been poor, he is weaker. No O2 desaturation, he is afebrile. CXR 03/14/17 showed no evidence of acute pulmonary disease. Will continue supportive measures, observe the patient.    Adjustment disorder with mixed anxiety and depressed mood He stated he is not sleeping well at night since the onset of URI. Hx of anxiety/depressed mood, off Lorazepam since 06/09/16 Will make Lorazepam 0.53m hs prn available to him x 14 days. Observe.      Family/ staff Communication: plan of care reviewed with the patient, the patient's POA daughter, sEducation officer, museum and charge nurse  Labs/tests  ordered:  CBC/diff, BMP   Time spend

## 2017-03-15 NOTE — Assessment & Plan Note (Signed)
patient's complaining of persisted nasal congestion, non productive cough, on Claritin and Mucinex. The patient denied fever, headache, SOB, chest pain/pressures, palpitation, nausea, vomiting, constipation/diarrhea. He admitted his appetite has been poor, he is weaker. No O2 desaturation, he is afebrile. CXR 03/14/17 showed no evidence of acute pulmonary disease. Will continue supportive measures, observe the patient.

## 2017-03-16 ENCOUNTER — Other Ambulatory Visit: Payer: Self-pay | Admitting: *Deleted

## 2017-03-16 LAB — BASIC METABOLIC PANEL
BUN: 31 — AB (ref 4–21)
Creatinine: 1.2 (ref <=1.3)
Glucose: 120
POTASSIUM: 3.8 (ref 3.4–5.3)
Sodium: 133 — AB (ref 137–147)

## 2017-03-16 LAB — CBC AND DIFFERENTIAL
HEMATOCRIT: 40 — AB (ref 41–53)
HEMOGLOBIN: 14.1 (ref 13.5–17.5)
Platelets: 199 (ref 150–399)
WBC: 10.5

## 2017-03-16 NOTE — Assessment & Plan Note (Signed)
He stated he is not sleeping well at night since the onset of URI. Hx of anxiety/depressed mood, off Lorazepam since 06/09/16 Will make Lorazepam 0.5mg  hs prn available to him x 14 days. Observe.

## 2017-03-17 DIAGNOSIS — I5022 Chronic systolic (congestive) heart failure: Secondary | ICD-10-CM | POA: Diagnosis not present

## 2017-03-17 DIAGNOSIS — I69954 Hemiplegia and hemiparesis following unspecified cerebrovascular disease affecting left non-dominant side: Secondary | ICD-10-CM | POA: Diagnosis not present

## 2017-03-17 DIAGNOSIS — G609 Hereditary and idiopathic neuropathy, unspecified: Secondary | ICD-10-CM | POA: Diagnosis not present

## 2017-03-17 DIAGNOSIS — E871 Hypo-osmolality and hyponatremia: Secondary | ICD-10-CM | POA: Diagnosis not present

## 2017-03-17 DIAGNOSIS — F419 Anxiety disorder, unspecified: Secondary | ICD-10-CM | POA: Diagnosis not present

## 2017-03-17 DIAGNOSIS — E139 Other specified diabetes mellitus without complications: Secondary | ICD-10-CM | POA: Diagnosis not present

## 2017-03-17 DIAGNOSIS — I10 Essential (primary) hypertension: Secondary | ICD-10-CM | POA: Diagnosis not present

## 2017-03-17 DIAGNOSIS — E039 Hypothyroidism, unspecified: Secondary | ICD-10-CM | POA: Diagnosis not present

## 2017-03-17 DIAGNOSIS — G4730 Sleep apnea, unspecified: Secondary | ICD-10-CM | POA: Diagnosis not present

## 2017-03-17 DIAGNOSIS — M25562 Pain in left knee: Secondary | ICD-10-CM | POA: Diagnosis not present

## 2017-03-17 DIAGNOSIS — M1000 Idiopathic gout, unspecified site: Secondary | ICD-10-CM | POA: Diagnosis not present

## 2017-03-17 DIAGNOSIS — F329 Major depressive disorder, single episode, unspecified: Secondary | ICD-10-CM | POA: Diagnosis not present

## 2017-03-17 DIAGNOSIS — N3942 Incontinence without sensory awareness: Secondary | ICD-10-CM | POA: Diagnosis not present

## 2017-03-17 DIAGNOSIS — I480 Paroxysmal atrial fibrillation: Secondary | ICD-10-CM | POA: Diagnosis not present

## 2017-03-17 DIAGNOSIS — I4891 Unspecified atrial fibrillation: Secondary | ICD-10-CM | POA: Diagnosis not present

## 2017-03-17 DIAGNOSIS — E785 Hyperlipidemia, unspecified: Secondary | ICD-10-CM | POA: Diagnosis not present

## 2017-03-17 DIAGNOSIS — M2550 Pain in unspecified joint: Secondary | ICD-10-CM | POA: Diagnosis not present

## 2017-03-17 DIAGNOSIS — I2510 Atherosclerotic heart disease of native coronary artery without angina pectoris: Secondary | ICD-10-CM | POA: Diagnosis not present

## 2017-03-17 DIAGNOSIS — M1990 Unspecified osteoarthritis, unspecified site: Secondary | ICD-10-CM | POA: Diagnosis not present

## 2017-03-17 DIAGNOSIS — E559 Vitamin D deficiency, unspecified: Secondary | ICD-10-CM | POA: Diagnosis not present

## 2017-03-17 DIAGNOSIS — M170 Bilateral primary osteoarthritis of knee: Secondary | ICD-10-CM | POA: Diagnosis not present

## 2017-03-17 DIAGNOSIS — R1313 Dysphagia, pharyngeal phase: Secondary | ICD-10-CM | POA: Diagnosis not present

## 2017-03-17 DIAGNOSIS — M1A9XX0 Chronic gout, unspecified, without tophus (tophi): Secondary | ICD-10-CM | POA: Diagnosis not present

## 2017-03-17 DIAGNOSIS — I259 Chronic ischemic heart disease, unspecified: Secondary | ICD-10-CM | POA: Diagnosis not present

## 2017-03-20 DIAGNOSIS — M170 Bilateral primary osteoarthritis of knee: Secondary | ICD-10-CM | POA: Diagnosis not present

## 2017-03-20 DIAGNOSIS — E039 Hypothyroidism, unspecified: Secondary | ICD-10-CM | POA: Diagnosis not present

## 2017-03-20 DIAGNOSIS — N3942 Incontinence without sensory awareness: Secondary | ICD-10-CM | POA: Diagnosis not present

## 2017-03-20 DIAGNOSIS — R1313 Dysphagia, pharyngeal phase: Secondary | ICD-10-CM | POA: Diagnosis not present

## 2017-03-20 DIAGNOSIS — I480 Paroxysmal atrial fibrillation: Secondary | ICD-10-CM | POA: Diagnosis not present

## 2017-03-20 DIAGNOSIS — M1000 Idiopathic gout, unspecified site: Secondary | ICD-10-CM | POA: Diagnosis not present

## 2017-03-21 ENCOUNTER — Ambulatory Visit (INDEPENDENT_AMBULATORY_CARE_PROVIDER_SITE_OTHER): Payer: Medicare Other | Admitting: Cardiovascular Disease

## 2017-03-21 ENCOUNTER — Encounter: Payer: Self-pay | Admitting: Cardiovascular Disease

## 2017-03-21 VITALS — BP 108/60 | HR 72 | Ht 68.0 in | Wt 159.0 lb

## 2017-03-21 DIAGNOSIS — I5022 Chronic systolic (congestive) heart failure: Secondary | ICD-10-CM | POA: Diagnosis not present

## 2017-03-21 DIAGNOSIS — I2510 Atherosclerotic heart disease of native coronary artery without angina pectoris: Secondary | ICD-10-CM

## 2017-03-21 DIAGNOSIS — I714 Abdominal aortic aneurysm, without rupture, unspecified: Secondary | ICD-10-CM

## 2017-03-21 DIAGNOSIS — M1000 Idiopathic gout, unspecified site: Secondary | ICD-10-CM | POA: Diagnosis not present

## 2017-03-21 DIAGNOSIS — I952 Hypotension due to drugs: Secondary | ICD-10-CM | POA: Diagnosis not present

## 2017-03-21 DIAGNOSIS — Z950 Presence of cardiac pacemaker: Secondary | ICD-10-CM

## 2017-03-21 DIAGNOSIS — R1313 Dysphagia, pharyngeal phase: Secondary | ICD-10-CM | POA: Diagnosis not present

## 2017-03-21 DIAGNOSIS — I482 Chronic atrial fibrillation: Secondary | ICD-10-CM

## 2017-03-21 DIAGNOSIS — I4821 Permanent atrial fibrillation: Secondary | ICD-10-CM

## 2017-03-21 DIAGNOSIS — G4733 Obstructive sleep apnea (adult) (pediatric): Secondary | ICD-10-CM | POA: Diagnosis not present

## 2017-03-21 DIAGNOSIS — N3942 Incontinence without sensory awareness: Secondary | ICD-10-CM | POA: Diagnosis not present

## 2017-03-21 DIAGNOSIS — Z7901 Long term (current) use of anticoagulants: Secondary | ICD-10-CM

## 2017-03-21 DIAGNOSIS — M170 Bilateral primary osteoarthritis of knee: Secondary | ICD-10-CM | POA: Diagnosis not present

## 2017-03-21 DIAGNOSIS — I480 Paroxysmal atrial fibrillation: Secondary | ICD-10-CM | POA: Diagnosis not present

## 2017-03-21 DIAGNOSIS — E039 Hypothyroidism, unspecified: Secondary | ICD-10-CM | POA: Diagnosis not present

## 2017-03-21 NOTE — Progress Notes (Signed)
Patient ID: Talon Regala, male   DOB: 1921/08/27, 84 y.o.   MRN: [id number]     Cardiology Office Note    Date:  03/21/2017   ID:  [name], DOB 25-Jan-1922, MRN [id number]  PCP:  Blanchie Serve, MD  Cardiologist:  Pixie Casino, MD; Sanda Klein, MD   chief complaint: fatigue   History of Present Illness:  Cordon Gassett is a 84 y.o. male with congestive heart failure, permanent atrial fibrillation, history of previous stroke on chronic anticoagulation, CAD s/p CABG, COPD here for follow-up on his permanent pacemaker (initially implanted for sinus node dysfunction with symptomatic bradycardia, now programmed VVIR).  He last saw Dr. Debara Pickett in September 2018.  Overall he is doing quite well and has no specific cardiac complaints. He's had a few nosebleeds during the dry winter months but none of them have been severe. He has developed some degree of aspiration but after being switched to thickened liquids is doing better. He is working with a sleep pathologies. He may have some leftover sequela from previous strokes. He underwent a home sleep study that apparently again confirmed sleep apnea, but is still waiting to hear about some insurance coverage for CPAP equipment. He makes it very clear that is not eager to try CPAP again because he did not enjoy the fuss in the past.  He does not have problems with shortness of breath with usual activities and has functional class I-II status. He denies any chest discomfort. He is relatively sedentary.   The patient specifically denies any chest pain at rest or with exertion, dyspnea at rest, orthopnea, paroxysmal nocturnal dyspnea, syncope, palpitations, focal neurological deficits, intermittent claudication, lower extremity edema, unexplained weight gain, hemoptysis or wheezing.  He's had some difficulty performing remote download on his pacemaker from [facility].  Comprehensive interrogation of his device in the office today  shows normal function. He has an MRI conditional Medtronic Revo device implanted in 2011. Generator longevity is not automatically reported by his device but I expect another 2-3 years(voltage 2.88 V, RRT 2.81 V). Ventricular lead parameters are excellent with sensed R waves of 8.1 mV, impedance 432 ohms, auto capture. He has roughly 50% ventricular pacing and 50% ventricular sensed rhythm with a histogram that is appropriate for his level of activity. He is in permanent atrial fibrillation and his dual-chamber device is now programmed VVIR.  Past Medical History:  Diagnosis Date  . Abnormal PFTs 10/30/2012   Followed in Pulmonary clinic/ [facility]/ [place]  - PFT's 10/22/12 VC  55% no obst, DLCO 50%  - PFT's 12/20/2012 VC 83% and no obst, dLCO 55%  -11/08/2012  Walked RA x 3 laps @ 185 ft each stopped due to  End of study, not desat -11/08/12 esr 10    . Abnormality of gait 04/18/2013  . Acute on chronic systolic congestive heart failure, NYHA class 2 ([place]) 03/16/2006   BNP 376.9 05/28/13   . Adjustment disorder with mixed anxiety and depressed mood 04/18/2013   Post CVA depression and anxiety   . Allergy   . Anxiety   . Aortic aneurysm of unspecified site without mention of rupture 10/27/2011  . Arthritis   . Atherosclerosis of renal artery ([place]) 10/18/2006  . Atrial fibrillation ([place]) 03/04/2011    He was on coumadin until his stroke last December and was switched to apixaban 2.'5mg'$  daily which he has been taking faithfully without reported side effects.    . Balance disorder 07/31/2014  .  Basal cell carcinoma of skin of other and unspecified parts of face 12/24/2009  . Blood transfusion without reported diagnosis   . Cardiac pacemaker in situ- MDT 10/11 03/18/2010   Medtronic revo implant in October 2011. Severe sinus bradycardia in the 30s, but not truly pacemaker dependent   . Cataract   . CKD stage 2 due to type 2 diabetes mellitus ([place]) 07/03/2014  . Coronary artery disease   . CVA - Rt brain  stroke 12/14 02/18/2013   02/19/13 angiography CT head:  Diffuse atherosclerotic irregularity and plaque formation of the distal right common carotid artery and proximal internal carotid artery without significant stenosis. Plaque ulceration is present and is a source of emboli. A small right thalamic CVA    . DM (diabetes mellitus), type 2 with renal complications ([place]) 07/23/8414  . Dyslipidemia 11/03/2004  . Fall 03/04/2011  . Fatigue 04/18/2013  . Gout, unspecified 11/03/2004  . Heart murmur   . History of abdominal aortic aneurysm   . History of Doppler ultrasound 05/22/2012   LEAs; R anterior tibial artery appeared occluded; L posterior tibial shows short segment of occlusive ds  . History of Doppler ultrasound 05/22/2012   Abdominal Aortic Doppler; slight increase in fusiform aneurysm   . History of echocardiogram 12/2008   EF >55%; mild concentric LVH; mild MR; mild-mod TR; mild AV regurg;   . History of nuclear stress test 12/2010   lexiscan; low risk; compared to prior study, perfusion improved  . HTN (hypertension) 03/04/2011  . Hyperlipidemia   . Hypertension   . Hypertrophy of prostate with urinary obstruction and other lower urinary tract symptoms (LUTS) 04/28/2004  . Hyponatremia 03/04/2011  . Hypothyroidism 03/04/2011  . Impotence of organic origin 10/03/2000  . Internal hemorrhoids without mention of complication 60/63/0160  . Long term (current) use of anticoagulants 03/17/2011  . Major depressive disorder, single episode, unspecified 03/05/2009  . Memory change 01/24/2013  . Muscle weakness (generalized) 03/10/2011  . Nocturia 10/27/2011  . Obstructive sleep apnea (adult) (pediatric) 07/23/2008  . Open wound of knee, leg (except thigh), and ankle, complicated 03/15/3233  . Osteoarthritis of both knees 07/31/2014  . Osteoarthrosis, unspecified whether generalized or localized, unspecified site 02/05/2003  . PAF (paroxysmal atrial fibrillation) (H[facility])    coumadin  . Pain in joint,  lower leg 08/04/2011  . Pruritic condition 01/24/2013  . Quadriceps weakness 07/31/2014  . Right bundle branch block 04/19/2006  . 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 Spinal stenosis in cervical region 12/15/2004  . SSS (sick sinus syndrome) ([place]) 04/08/2014  . Stroke ([place])   . Thyroid disease   . TIA (transient ischemic attack) 04/30/2013  . Type II or unspecified type diabetes mellitus without mention of complication, not stated as uncontrolled 11/24/2004  . Unspecified vitamin D deficiency 10/18/2006  . 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 Increased exposure of the right cornea.     Past Surgical History:  Procedure Laterality Date  . CORONARY ANGIOPLASTY WITH STENT PLACEMENT  2008   stent to SVG to OM  . CORONARY ARTERY BYPASS GRAFT  1989  . EYE SURGERY    . HERNIA REPAIR    . INSERT / REPLACE / REMOVE PACEMAKER    . JOINT REPLACEMENT    . PACEMAKER INSERTION  2011    Current Medications: Outpatient Medications Prior to Visit  Medication Sig Dispense Refill  . acetaminophen (TYLENOL) 325 MG tablet Take  650 mg by mouth every 4 (four) hours as needed for mild pain (PRN for minor pain/headache x 48 hours. Notify MD of continued pain/headache. Do not exceed 3,000 mg in 24 hours. Document area of discomfort and effectiveness of medication.).    Marland Kitchen allopurinol (ZYLOPRIM) 100 MG tablet Take 50 mg by mouth daily.     Marland Kitchen amoxicillin (AMOXIL) 500 MG capsule Take 500 mg by mouth. Take 4 caps prior to dental procedures    . apixaban (ELIQUIS) 2.5 MG TABS tablet Take 1 tablet (2.5 mg total) by mouth 2 (two) times daily. 60 tablet 11  . carboxymethylcellulose (REFRESH PLUS) 0.5 % SOLN Place 1 drop into both eyes 3 (three) times daily. May also do daily as needed    . Cholecalciferol 1000 units tablet Take 2,000 Units by mouth daily.    Marland Kitchen Dextran 70-Hypromellose 0.1-0.3 % SOLN Apply 1 drop to eye 2 (two) times daily as  needed.    Marland Kitchen dextromethorphan 7.5 MG/5ML SYRP Take 10 mLs by mouth every 6 (six) hours. Notify MD is cough continues.    . furosemide (LASIX) 20 MG tablet Take 60 mg by mouth daily.     Marland Kitchen guaiFENesin (MUCINEX) 600 MG 12 hr tablet Take 600 mg by mouth 2 (two) times daily.    Marland Kitchen ipratropium-albuterol (DUONEB) 0.5-2.5 (3) MG/3ML SOLN Take 3 mLs by nebulization 4 (four) times daily as needed.    Marland Kitchen levothyroxine (SYNTHROID, LEVOTHROID) 125 MCG tablet Take 125 mcg by mouth daily before breakfast.    . loratadine (CLARITIN) 10 MG tablet Take 10 mg by mouth once a week.    Marland Kitchen LORazepam (ATIVAN) 0.5 MG tablet Take 0.5 mg by mouth every 8 (eight) hours.    Marland Kitchen losartan (COZAAR) 25 MG tablet Take 1 tablet (25 mg total) by mouth daily. 90 tablet 3  . meclizine (ANTIVERT) 25 MG tablet Take 25 mg by mouth every 6 (six) hours as needed.     . Menthol, Topical Analgesic, (BIOFREEZE) 4 % GEL Apply topically. Apply to left knee four times daily as needed for pain    . metoprolol tartrate (LOPRESSOR) 25 MG tablet Take 1 tablet (25 mg total) by mouth 2 (two) times daily. 180 tablet 3  . Neomycin-Bacitracin-Polymyxin (TRIPLE ANTIBIOTIC) 3.5-862 [id number] [id number] OINT Apply 1 application topically daily as needed (apply to bilateral nostrils).    . nitroGLYCERIN (NITROSTAT) 0.4 MG SL tablet Place 0.4 mg under the tongue every 5 (five) minutes as needed for chest pain.     Marland Kitchen olopatadine (PATANOL) 0.1 % ophthalmic solution Place 1 drop into both eyes 2 (two) times daily as needed for allergies.    . OXYGEN Inhale 2 L into the lungs continuous.    . polyethylene glycol (MIRALAX / GLYCOLAX) packet Take 17 g by mouth daily as needed.     . tamsulosin (FLOMAX) 0.4 MG CAPS capsule Take one cap by mouth daily    . tobramycin-dexamethasone (TOBRADEX) ophthalmic ointment Place 1 application into both eyes daily as needed.     . triamcinolone cream (KENALOG) 0.1 % Apply 1 application topically daily.    Marland Kitchen UNABLE TO FIND Med Name: Hydrocortisone  cream with applicator 1%. Apply to hemorrhoids 2 times daily as needed     No facility-administered medications prior to visit.      Allergies:   Quinolones; Caduet [amlodipine-atorvastatin]; Crestor [rosuvastatin]; Lipitor [atorvastatin calcium]; Simvastatin; Norvasc [amlodipine]; and Lisinopril   Social History   Socioeconomic History  . Marital status: Married  Spouse name: None  . Number of children: 3  . Years of education: BA  . Highest education level: None  Social Needs  . Financial resource strain: None  . Food insecurity - worry: None  . Food insecurity - inability: None  . Transportation needs - medical: None  . Transportation needs - non-medical: None  Occupational History  . Occupation: Retired Armed forces operational officer  Tobacco Use  . Smoking status: Former Smoker    Packs/day: 0.75    Years: 15.00    Pack years: 11.25    Types: Cigarettes    Last attempt to quit: 03/04/1967    Years since quitting: 50.0  . Smokeless tobacco: Never Used  Substance and Sexual Activity  . Alcohol use: No    Alcohol/week: 0.0 oz  . Drug use: No  . Sexual activity: No  Other Topics Concern  . None  Social History Narrative   Lives at [facility] since 03/2009, moved to [place] 04/2013   Widowed    Stopped smoking 1968   Alcohol none   Exercise  none   Walks with walker   Living Will, POA           Family History:  The patient's family history includes Diabetes in his father; Other in his mother.   ROS:   Please see the history of present illness.    ROS All other systems reviewed and are negative.   PHYSICAL EXAM:   VS:  BP 108/60   Pulse 72   Ht '5\' 8"'$  (1.727 m)   Wt 159 lb (72.1 kg)   BMI 24.18 kg/m      General: Alert, oriented x3, no distress, lean Head: no evidence of trauma, PERRL, EOMI, no exophtalmos or lid lag, no myxedema, no xanthelasma; normal ears, nose and oropharynx Neck: normal jugular venous pulsations and no hepatojugular reflux; brisk carotid  pulses without delay and no carotid bruits Chest: clear to auscultation, no signs of consolidation by percussion or palpation, normal fremitus, symmetrical and full respiratory excursions Cardiovascular: normal position and quality of the apical impulse, irregular rhythm, normal first and second heart sounds, no murmurs, rubs or gallops, healthy left subclavian pacemaker site Abdomen: no tenderness or distention, no masses by palpation, no abnormal pulsatility or arterial bruits, normal bowel sounds, no hepatosplenomegaly Extremities: no clubbing, cyanosis or edema; 2+ radial, ulnar and brachial pulses bilaterally; 2+ right femoral, posterior tibial and dorsalis pedis pulses; 2+ left femoral, posterior tibial and dorsalis pedis pulses; no subclavian or femoral bruits Neurological: grossly nonfocal Psych: Normal mood and affect   Wt Readings from Last 3 Encounters:  03/21/17 159 lb (72.1 kg)  03/15/17 158 lb (71.7 kg)  03/13/17 158 lb (71.7 kg)      Studies/Labs Reviewed:   EKG:  EKG is ordered today.  His ECG shows atrial fibrillation with occasional ventricular paced beats; the native QRS has a right bundle branch block morphology there are no acute ischemic abnormalities   Recent Labs: 11/03/2016: TSH 1.69 03/12/2017: ALT 10 03/16/2017: BUN 31; Creatinine 1.2; Hemoglobin 14.1; Platelets 199; Potassium 3.8; Sodium 133   Lipid Panel    Component Value Date/Time   CHOL 130 10/18/2016   TRIG 70 10/18/2016   HDL 32 (A) 10/18/2016   CHOLHDL 3.9 05/01/2013 0528   VLDL 16 05/01/2013 0528   LDLCALC 83 10/18/2016    ASSESSMENT:    1. Chronic systolic congestive heart failure ([place])   2. Atherosclerosis of native coronary artery of native heart without  angina pectoris   3. Drug-induced hypotension   4. Permanent atrial fibrillation ([place])   5. Long term current use of anticoagulant therapy   6. Pacemaker   7. Abdominal aortic aneurysm (AAA) without rupture ([place])   8. OSA (obstructive  sleep apnea)     PLAN:  In order of problems listed above:  1. CHF: From clinical exam he appears to be at euvolemic, NYHA functional class I-II on current dose of loop diuretics 2. CAD: He does not have angina pectoris. Its been over 10 years since his last revascularization procedure. 3. Hypotension: Blood pressure is in desirable range then has not had any symptoms of hypotension recently. 4. AFib: I think on the current dose of metoprolol were achieving the right balance between avoiding high ventricular rates and excessive ventricular slowing and increased frequency of pacing. He has extremely rare episodes of high ventricular response average ventricular rate is in the 60s and 70s. CHADSVasc 8 (age 49, CVA 2, HTN, DM, CHF, CAD and AAA). 5. Anticoagulation: Continue Eliquis. He has some "nuisance" bleeding complications but no serious bleeding issues. The risk of stroke outweighs the bleeding problems 6. PPM: We'll try to simplify his home remote download process with one of the newer devices. He should perform a download in April, but I made a backup appointment for him with me in July in case we can get it to work. He is not pacemaker dependent. He would prefer not to have to come to the office for a device check. 7. AAA: Scheduled for repeat ultrasound in March. Asymptomatic. 8. OSA: Will forward this note to our sleep clinic to see if we can facilitate the next appropriate step. Discussed the importance of sleep apnea therapy to improve prognosis from heart failure.   Medication Adjustments/Labs and Tests Ordered: Current medicines are reviewed at length with the patient today.  Concerns regarding medicines are outlined above.  Medication changes, Labs and Tests ordered today are listed in the Patient Instructions below. Patient Instructions  Dr Sallyanne Kuster recommends that you continue on your current medications as directed. Please refer to the Current Medication list given to you  today.  Remote monitoring is used to monitor your Pacemaker of ICD from home. This monitoring reduces the number of office visits required to check your device to one time per year. It allows [name] to keep an eye on the functioning of your device to ensure it is working properly. You are scheduled for a device check from home on Tuesday, April 16th, 2019. You may send your transmission at any time that day. If you have a wireless device, the transmission will be sent automatically. After your physician reviews your transmission, you will receive a postcard with your next transmission date.  Dr Sallyanne Kuster recommends that you schedule a follow-up appointment in 6 months with a pacemaker check. You will receive a reminder letter in the mail two months in advance. If you don't receive a letter, please call our office to schedule the follow-up appointment.  If you need a refill on your cardiac medications before your next appointment, please call your pharmacy.    Signed, Sanda Klein, MD  03/21/2017 6:12 PM    [facility] [address], [place],   [address] Phone: [phone]; Fax: [phone]

## 2017-03-21 NOTE — Patient Instructions (Signed)
Dr Sallyanne Kuster recommends that you continue on your current medications as directed. Please refer to the Current Medication list given to you today.  Remote monitoring is used to monitor your Pacemaker of ICD from home. This monitoring reduces the number of office visits required to check your device to one time per year. It allows [name] to keep an eye on the functioning of your device to ensure it is working properly. You are scheduled for a device check from home on Tuesday, April 16th, 2019. You may send your transmission at any time that day. If you have a wireless device, the transmission will be sent automatically. After your physician reviews your transmission, you will receive a postcard with your next transmission date.  Dr Sallyanne Kuster recommends that you schedule a follow-up appointment in 6 months with a pacemaker check. You will receive a reminder letter in the mail two months in advance. If you don't receive a letter, please call our office to schedule the follow-up appointment.  If you need a refill on your cardiac medications before your next appointment, please call your pharmacy.

## 2017-03-22 ENCOUNTER — Telehealth: Payer: Self-pay | Admitting: *Deleted

## 2017-03-22 NOTE — Telephone Encounter (Signed)
Spoke w/ pt daughter and informed her of Medtronic my carelink smart home monitor. She agreed to try this monitor. Informed pt daughter I would call back once I figured out how to get her a new monitor.   We had extra My Carelink Smart monitors in the office. I was able to reassign patient home monitor in Carelink to the new one.   Called pt daughter back and LM informing her that I would be mailing the monitor to her.

## 2017-03-22 NOTE — Progress Notes (Signed)
Gotcha! Thanks for figuring that out. Can we please tell him to call [facility]. MCr

## 2017-03-22 NOTE — Telephone Encounter (Signed)
Croitoru, Dani Gobble, MD  P Cv Div [facility] [address] Device        Any options to update the pacemaker transmitter? Having trouble with the hard wiring at Friend's home. Option for smart phone downloads?  Please get in touch with Daughter, Izora Gala at [phone]. Thanks,  MCr    LMOM to return call to the [facility] regarding home monitor for Mr. Spitler.

## 2017-03-23 ENCOUNTER — Other Ambulatory Visit: Payer: Self-pay | Admitting: Neurology

## 2017-03-23 ENCOUNTER — Telehealth: Payer: Self-pay | Admitting: Neurology

## 2017-03-23 DIAGNOSIS — M170 Bilateral primary osteoarthritis of knee: Secondary | ICD-10-CM | POA: Diagnosis not present

## 2017-03-23 DIAGNOSIS — R1313 Dysphagia, pharyngeal phase: Secondary | ICD-10-CM | POA: Diagnosis not present

## 2017-03-23 DIAGNOSIS — N3942 Incontinence without sensory awareness: Secondary | ICD-10-CM | POA: Diagnosis not present

## 2017-03-23 DIAGNOSIS — E039 Hypothyroidism, unspecified: Secondary | ICD-10-CM | POA: Diagnosis not present

## 2017-03-23 DIAGNOSIS — M1000 Idiopathic gout, unspecified site: Secondary | ICD-10-CM | POA: Diagnosis not present

## 2017-03-23 DIAGNOSIS — I480 Paroxysmal atrial fibrillation: Secondary | ICD-10-CM | POA: Diagnosis not present

## 2017-03-23 NOTE — Telephone Encounter (Signed)
I have contacted [employer] today and they replied stating that they had been calling the preferred number that was on file instead of calling the daughter's line. I have gave them the correct information. They will attempt calling today.

## 2017-03-23 NOTE — Telephone Encounter (Signed)
Pt's daughter Izora Gala called and stated she has yet to hear from [facility]. I gave her the number to reach out to them. Also, I informed [name] that I would let the nurse know this information.

## 2017-03-24 ENCOUNTER — Encounter: Payer: Self-pay | Admitting: Nurse Practitioner

## 2017-03-24 DIAGNOSIS — I480 Paroxysmal atrial fibrillation: Secondary | ICD-10-CM | POA: Diagnosis not present

## 2017-03-24 DIAGNOSIS — E039 Hypothyroidism, unspecified: Secondary | ICD-10-CM | POA: Diagnosis not present

## 2017-03-24 DIAGNOSIS — N3942 Incontinence without sensory awareness: Secondary | ICD-10-CM | POA: Diagnosis not present

## 2017-03-24 DIAGNOSIS — M170 Bilateral primary osteoarthritis of knee: Secondary | ICD-10-CM | POA: Diagnosis not present

## 2017-03-24 DIAGNOSIS — R1313 Dysphagia, pharyngeal phase: Secondary | ICD-10-CM | POA: Diagnosis not present

## 2017-03-24 DIAGNOSIS — M1000 Idiopathic gout, unspecified site: Secondary | ICD-10-CM | POA: Diagnosis not present

## 2017-03-28 DIAGNOSIS — R1313 Dysphagia, pharyngeal phase: Secondary | ICD-10-CM | POA: Diagnosis not present

## 2017-03-28 DIAGNOSIS — N3942 Incontinence without sensory awareness: Secondary | ICD-10-CM | POA: Diagnosis not present

## 2017-03-28 DIAGNOSIS — M1000 Idiopathic gout, unspecified site: Secondary | ICD-10-CM | POA: Diagnosis not present

## 2017-03-28 DIAGNOSIS — I480 Paroxysmal atrial fibrillation: Secondary | ICD-10-CM | POA: Diagnosis not present

## 2017-03-28 DIAGNOSIS — M170 Bilateral primary osteoarthritis of knee: Secondary | ICD-10-CM | POA: Diagnosis not present

## 2017-03-28 DIAGNOSIS — E039 Hypothyroidism, unspecified: Secondary | ICD-10-CM | POA: Diagnosis not present

## 2017-03-30 DIAGNOSIS — H04123 Dry eye syndrome of bilateral lacrimal glands: Secondary | ICD-10-CM | POA: Diagnosis not present

## 2017-04-06 DIAGNOSIS — M1000 Idiopathic gout, unspecified site: Secondary | ICD-10-CM | POA: Diagnosis not present

## 2017-04-06 DIAGNOSIS — M170 Bilateral primary osteoarthritis of knee: Secondary | ICD-10-CM | POA: Diagnosis not present

## 2017-04-06 DIAGNOSIS — E039 Hypothyroidism, unspecified: Secondary | ICD-10-CM | POA: Diagnosis not present

## 2017-04-06 DIAGNOSIS — N3942 Incontinence without sensory awareness: Secondary | ICD-10-CM | POA: Diagnosis not present

## 2017-04-06 DIAGNOSIS — I480 Paroxysmal atrial fibrillation: Secondary | ICD-10-CM | POA: Diagnosis not present

## 2017-04-06 DIAGNOSIS — R1313 Dysphagia, pharyngeal phase: Secondary | ICD-10-CM | POA: Diagnosis not present

## 2017-04-13 DIAGNOSIS — H04123 Dry eye syndrome of bilateral lacrimal glands: Secondary | ICD-10-CM | POA: Diagnosis not present

## 2017-04-17 DIAGNOSIS — F329 Major depressive disorder, single episode, unspecified: Secondary | ICD-10-CM | POA: Diagnosis not present

## 2017-04-17 DIAGNOSIS — F419 Anxiety disorder, unspecified: Secondary | ICD-10-CM | POA: Diagnosis not present

## 2017-04-17 DIAGNOSIS — I10 Essential (primary) hypertension: Secondary | ICD-10-CM | POA: Diagnosis not present

## 2017-04-17 DIAGNOSIS — R1313 Dysphagia, pharyngeal phase: Secondary | ICD-10-CM | POA: Diagnosis not present

## 2017-04-17 DIAGNOSIS — M1990 Unspecified osteoarthritis, unspecified site: Secondary | ICD-10-CM | POA: Diagnosis not present

## 2017-04-17 DIAGNOSIS — M1A9XX0 Chronic gout, unspecified, without tophus (tophi): Secondary | ICD-10-CM | POA: Diagnosis not present

## 2017-04-17 DIAGNOSIS — I259 Chronic ischemic heart disease, unspecified: Secondary | ICD-10-CM | POA: Diagnosis not present

## 2017-04-17 DIAGNOSIS — M170 Bilateral primary osteoarthritis of knee: Secondary | ICD-10-CM | POA: Diagnosis not present

## 2017-04-17 DIAGNOSIS — M2550 Pain in unspecified joint: Secondary | ICD-10-CM | POA: Diagnosis not present

## 2017-04-17 DIAGNOSIS — E559 Vitamin D deficiency, unspecified: Secondary | ICD-10-CM | POA: Diagnosis not present

## 2017-04-17 DIAGNOSIS — E039 Hypothyroidism, unspecified: Secondary | ICD-10-CM | POA: Diagnosis not present

## 2017-04-17 DIAGNOSIS — I480 Paroxysmal atrial fibrillation: Secondary | ICD-10-CM | POA: Diagnosis not present

## 2017-04-17 DIAGNOSIS — E139 Other specified diabetes mellitus without complications: Secondary | ICD-10-CM | POA: Diagnosis not present

## 2017-04-17 DIAGNOSIS — I69954 Hemiplegia and hemiparesis following unspecified cerebrovascular disease affecting left non-dominant side: Secondary | ICD-10-CM | POA: Diagnosis not present

## 2017-04-17 DIAGNOSIS — M25562 Pain in left knee: Secondary | ICD-10-CM | POA: Diagnosis not present

## 2017-04-17 DIAGNOSIS — I2510 Atherosclerotic heart disease of native coronary artery without angina pectoris: Secondary | ICD-10-CM | POA: Diagnosis not present

## 2017-04-17 DIAGNOSIS — G4730 Sleep apnea, unspecified: Secondary | ICD-10-CM | POA: Diagnosis not present

## 2017-04-17 DIAGNOSIS — E785 Hyperlipidemia, unspecified: Secondary | ICD-10-CM | POA: Diagnosis not present

## 2017-04-17 DIAGNOSIS — E871 Hypo-osmolality and hyponatremia: Secondary | ICD-10-CM | POA: Diagnosis not present

## 2017-04-17 DIAGNOSIS — I5022 Chronic systolic (congestive) heart failure: Secondary | ICD-10-CM | POA: Diagnosis not present

## 2017-04-17 DIAGNOSIS — M1000 Idiopathic gout, unspecified site: Secondary | ICD-10-CM | POA: Diagnosis not present

## 2017-04-17 DIAGNOSIS — N3942 Incontinence without sensory awareness: Secondary | ICD-10-CM | POA: Diagnosis not present

## 2017-04-17 DIAGNOSIS — G609 Hereditary and idiopathic neuropathy, unspecified: Secondary | ICD-10-CM | POA: Diagnosis not present

## 2017-04-17 DIAGNOSIS — I4891 Unspecified atrial fibrillation: Secondary | ICD-10-CM | POA: Diagnosis not present

## 2017-04-17 LAB — CUP PACEART INCLINIC DEVICE CHECK
Date Time Interrogation Session: 20190211173000
Implantable Lead Implant Date: 20111021
Implantable Lead Location: 753859
Implantable Lead Location: 753860
Implantable Pulse Generator Implant Date: 20111021
Lead Channel Setting Pacing Amplitude: 2.5 V
MDC IDC LEAD IMPLANT DT: 20111021
MDC IDC SET LEADCHNL RV PACING PULSEWIDTH: 0.4 ms
MDC IDC SET LEADCHNL RV SENSING SENSITIVITY: 0.9 mV

## 2017-04-20 ENCOUNTER — Encounter: Payer: Self-pay | Admitting: Internal Medicine

## 2017-04-20 ENCOUNTER — Non-Acute Institutional Stay: Payer: Medicare Other | Admitting: Internal Medicine

## 2017-04-20 DIAGNOSIS — N183 Chronic kidney disease, stage 3 unspecified: Secondary | ICD-10-CM

## 2017-04-20 DIAGNOSIS — I4821 Permanent atrial fibrillation: Secondary | ICD-10-CM

## 2017-04-20 DIAGNOSIS — I5022 Chronic systolic (congestive) heart failure: Secondary | ICD-10-CM

## 2017-04-20 DIAGNOSIS — E1122 Type 2 diabetes mellitus with diabetic chronic kidney disease: Secondary | ICD-10-CM | POA: Diagnosis not present

## 2017-04-20 DIAGNOSIS — I482 Chronic atrial fibrillation: Secondary | ICD-10-CM | POA: Diagnosis not present

## 2017-04-20 DIAGNOSIS — E039 Hypothyroidism, unspecified: Secondary | ICD-10-CM

## 2017-04-20 DIAGNOSIS — N401 Enlarged prostate with lower urinary tract symptoms: Secondary | ICD-10-CM | POA: Diagnosis not present

## 2017-04-20 NOTE — Progress Notes (Signed)
Provider: Blanchie Serve MD   Location:  [facility] Room Number: 902 Place of Service:  ALF ([phone])  PCP: Blanchie Serve, MD Patient Care Team: Blanchie Serve, MD as PCP - General (Internal Medicine) [place], Nadean Corwin, MD as Consulting Physician (Cardiology) Ardis Hughs, MD as Attending Physician (Urology) Mast, Man X, NP as Nurse Practitioner (Internal Medicine)  Extended Emergency Contact Information Primary Emergency Contact: [name] of [place] Phone: [phone] Relation: Daughter Secondary Emergency Contact: Eyvonne Mechanic [place] of [place] Phone: [phone] Mobile Phone: [phone] Relation: Daughter  Code Status: DNR  Goals of Care: Advanced Directive information Advanced Directives 04/20/2017  Does Patient Have a Medical Advance Directive? Yes  Type of Paramedic of [place];Living will;Out of facility DNR (pink MOST or yellow form)  Does patient want to make changes to medical advance directive? No - Patient declined  Copy of [place] in Chart? Yes  Pre-existing out of facility DNR order (yellow form or pink MOST form) Yellow form placed in chart (order not valid for inpatient use)      Chief Complaint  Patient presents with  . Medical Management of Chronic Issues    Routine Visit     HPI: Patient is a 84 y.o. [name] seen today for routine visit. He has been doing good. He is seen by his eye doctor and is on multiple eye drops. He is using CPAP. He is resting good at night. He gets around with his motorized wheelchair. He has stuffy and runny nose at times and occasional nose bleeds. He has his hearing aids. No acute health complaints today.   Past Medical History:  Diagnosis Date  . Abnormal PFTs 10/30/2012   Followed in Pulmonary clinic/ [facility]/ [place]  - PFT's 10/22/12 VC  55% no obst, DLCO 50%  - PFT's  12/20/2012 VC 83% and no obst, dLCO 55%  -11/08/2012  Walked RA x 3 laps @ 185 ft each stopped due to  End of study, not desat -11/08/12 esr 10    . Abnormality of gait 04/18/2013  . Acute on chronic systolic congestive heart failure, NYHA class 2 ([place]) 03/16/2006   BNP 376.9 05/28/13   . Adjustment disorder with mixed anxiety and depressed mood 04/18/2013   Post CVA depression and anxiety   . Allergy   . Anxiety   . Aortic aneurysm of unspecified site without mention of rupture 10/27/2011  . Arthritis   . Atherosclerosis of renal artery ([place]) 10/18/2006  . Atrial fibrillation ([place]) 03/04/2011    He was on coumadin until his stroke last December and was switched to apixaban 2.'5mg'$  daily which he has been taking faithfully without reported side effects.    . Balance disorder 07/31/2014  . Basal cell carcinoma of skin of other and unspecified parts of face 12/24/2009  . Blood transfusion without reported diagnosis   . Cardiac pacemaker in situ- MDT 10/11 03/18/2010   Medtronic revo implant in October 2011. Severe sinus bradycardia in the 30s, but not truly pacemaker dependent   . Cataract   . CKD stage 2 due to type 2 diabetes mellitus ([place]) 07/03/2014  . Coronary artery disease   . CVA - Rt brain stroke 12/14 02/18/2013   02/19/13 angiography CT head:  Diffuse atherosclerotic irregularity and plaque formation of the distal right common carotid artery and proximal internal carotid artery without significant stenosis. Plaque ulceration is present and is  a source of emboli. A small right thalamic CVA    . DM (diabetes mellitus), type 2 with renal complications ([place]) 9/79/8921  . Dyslipidemia 11/03/2004  . Fall 03/04/2011  . Fatigue 04/18/2013  . Gout, unspecified 11/03/2004  . Heart murmur   . History of abdominal aortic aneurysm   . History of Doppler ultrasound 05/22/2012   LEAs; R anterior tibial artery appeared occluded; L posterior tibial shows short segment of occlusive ds  . History of Doppler  ultrasound 05/22/2012   Abdominal Aortic Doppler; slight increase in fusiform aneurysm   . History of echocardiogram 12/2008   EF >55%; mild concentric LVH; mild MR; mild-mod TR; mild AV regurg;   . History of nuclear stress test 12/2010   lexiscan; low risk; compared to prior study, perfusion improved  . HTN (hypertension) 03/04/2011  . Hyperlipidemia   . Hypertension   . Hypertrophy of prostate with urinary obstruction and other lower urinary tract symptoms (LUTS) 04/28/2004  . Hyponatremia 03/04/2011  . Hypothyroidism 03/04/2011  . Impotence of organic origin 10/03/2000  . Internal hemorrhoids without mention of complication 19/41/7408  . Long term (current) use of anticoagulants 03/17/2011  . Major depressive disorder, single episode, unspecified 03/05/2009  . Memory change 01/24/2013  . Muscle weakness (generalized) 03/10/2011  . Nocturia 10/27/2011  . Obstructive sleep apnea (adult) (pediatric) 07/23/2008  . Open wound of knee, leg (except thigh), and ankle, complicated 1/44/8185  . Osteoarthritis of both knees 07/31/2014  . Osteoarthrosis, unspecified whether generalized or localized, unspecified site 02/05/2003  . PAF (paroxysmal atrial fibrillation) (HCC)    coumadin  . Pain in joint, lower leg 08/04/2011  . Pruritic condition 01/24/2013  . Quadriceps weakness 07/31/2014  . Right bundle branch block 04/19/2006  . 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 Spinal stenosis in cervical region 12/15/2004  . SSS (sick sinus syndrome) ([place]) 04/08/2014  . Stroke ([place])   . Thyroid disease   . TIA (transient ischemic attack) 04/30/2013  . Type II or unspecified type diabetes mellitus without mention of complication, not stated as uncontrolled 11/24/2004  . Unspecified vitamin D deficiency 10/18/2006  . 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 Increased exposure of the right cornea.    Past Surgical History:  Procedure  Laterality Date  . CORONARY ANGIOPLASTY WITH STENT PLACEMENT  2008   stent to SVG to OM  . CORONARY ARTERY BYPASS GRAFT  1989  . EYE SURGERY    . HERNIA REPAIR    . INSERT / REPLACE / REMOVE PACEMAKER    . JOINT REPLACEMENT    . PACEMAKER INSERTION  2011    reports that he quit smoking about 50 years ago. His smoking use included cigarettes. He has a 11.25 pack-year smoking history. he has never used smokeless tobacco. He reports that he does not drink alcohol or use drugs. Social History   Socioeconomic History  . Marital status: Married    Spouse name: Not on file  . Number of children: 3  . Years of education: BA  . Highest education level: Not on file  Social Needs  . Financial resource strain: Not on file  . Food insecurity - worry: Not on file  . Food insecurity - inability: Not on file  . Transportation needs - medical: Not on file  . Transportation needs - non-medical: Not on file  Occupational History  . Occupation: Retired Armed forces operational officer  Tobacco Use  .  Smoking status: Former Smoker    Packs/day: 0.75    Years: 15.00    Pack years: 11.25    Types: Cigarettes    Last attempt to quit: 03/04/1967    Years since quitting: 50.1  . Smokeless tobacco: Never Used  Substance and Sexual Activity  . Alcohol use: No    Alcohol/week: 0.0 oz  . Drug use: No  . Sexual activity: No  Other Topics Concern  . Not on file  Social History Narrative   Lives at [facility] since 03/2009, moved to [place] 04/2013   Widowed    Stopped smoking 1968   Alcohol none   Exercise  none   Walks with walker   Living Will, POA          Functional Status Survey:    Family History  Problem Relation Age of Onset  . Diabetes Father   . Other Mother        PAD with amputations    Health Maintenance  Topic Date Due  . OPHTHALMOLOGY EXAM  01/12/1934  . FOOT EXAM  07/31/2015  . HEMOGLOBIN A1C  05/17/2017  . PNA vac Low Risk Adult (2 of 2 - PPSV23) 01/03/2018  . TETANUS/TDAP   01/04/2027  . INFLUENZA VACCINE  Completed    Allergies  Allergen Reactions  . Quinolones Other (See Comments)    Risk of rupture of AAA  . Caduet [Amlodipine-Atorvastatin] Other (See Comments)    Weakness   . Crestor [Rosuvastatin] Other (See Comments)    Muscle pain/weakness  . Lipitor [Atorvastatin Calcium] Other (See Comments)    Muscle pain/weakness  . Simvastatin Other (See Comments)    Muscle pain/weakness  . Norvasc [Amlodipine]   . Lisinopril Other (See Comments)    Doesn't remember reaction    Outpatient Encounter Medications as of 04/20/2017  Medication Sig  . allopurinol (ZYLOPRIM) 100 MG tablet Take 50 mg by mouth daily.   Marland Kitchen amoxicillin (AMOXIL) 500 MG capsule Take 500 mg by mouth. Take 4 caps prior to dental procedures  . apixaban (ELIQUIS) 2.5 MG TABS tablet Take 1 tablet (2.5 mg total) by mouth 2 (two) times daily.  Marland Kitchen artificial tears (LACRILUBE) OINT ophthalmic ointment Place 1 application into both eyes at bedtime.  . carboxymethylcellulose (REFRESH PLUS) 0.5 % SOLN Place 1 drop into both eyes 3 (three) times daily. May also do daily as needed  . Cholecalciferol 1000 units tablet Take 2,000 Units by mouth daily.  Marland Kitchen Dextran 70-Hypromellose 0.1-0.3 % SOLN Apply 1 drop to eye 2 (two) times daily as needed.  Marland Kitchen Dextran 70-Hypromellose 0.1-0.3 % SOLN Apply 1 drop to eye 3 (three) times daily. Both eyes  . dextromethorphan-guaiFENesin (ROBAFEN DM COUGH) 10-100 MG/5ML liquid Take 10 mLs by mouth every 6 (six) hours as needed for cough.  . furosemide (LASIX) 20 MG tablet Take 60 mg by mouth daily.   . furosemide (LASIX) 20 MG tablet Take 20 mg by mouth as needed (weight gain > 3 lbs in 24 hours or >5 lbs in a week).  Marland Kitchen ipratropium-albuterol (DUONEB) 0.5-2.5 (3) MG/3ML SOLN Take 3 mLs by nebulization 4 (four) times daily as needed.  Marland Kitchen levothyroxine (SYNTHROID, LEVOTHROID) 125 MCG tablet Take 125 mcg by mouth daily before breakfast.  . Lifitegrast (XIIDRA) 5 % SOLN Apply 1  drop to eye 2 (two) times daily.  Marland Kitchen losartan (COZAAR) 25 MG tablet Take 1 tablet (25 mg total) by mouth daily.  . meclizine (ANTIVERT) 25 MG tablet Take 25  mg by mouth every 6 (six) hours as needed.   . Menthol, Topical Analgesic, (BIOFREEZE) 4 % GEL Apply topically. Apply to left knee four times daily as needed for pain  . metoprolol tartrate (LOPRESSOR) 25 MG tablet Take 1 tablet (25 mg total) by mouth 2 (two) times daily.  Marland Kitchen Neomycin-Bacitracin-Polymyxin (TRIPLE ANTIBIOTIC) [phone] OINT Apply 1 application topically daily as needed (apply to bilateral nostrils).  . nitroGLYCERIN (NITROSTAT) 0.4 MG SL tablet Place 0.4 mg under the tongue every 5 (five) minutes as needed for chest pain.   Marland Kitchen olopatadine (PATANOL) 0.1 % ophthalmic solution Place 1 drop into both eyes 2 (two) times daily as needed for allergies.  . OXYGEN Inhale 2 L into the lungs continuous.  . polyethylene glycol (MIRALAX / GLYCOLAX) packet Take 17 g by mouth daily as needed.   . tamsulosin (FLOMAX) 0.4 MG CAPS capsule Take one cap by mouth daily  . tobramycin-dexamethasone (TOBRADEX) ophthalmic ointment Place 1 application into both eyes 2 (two) times daily as needed.   . triamcinolone cream (KENALOG) 0.1 % Apply 1 application topically daily.  Marland Kitchen UNABLE TO FIND Med Name: Hydrocortisone cream with applicator 1%. Apply to hemorrhoids 2 times daily as needed  . Wheat Dextrin (BENEFIBER) POWD Take 1 Scoop by mouth daily as needed.  . [DISCONTINUED] acetaminophen (TYLENOL) 325 MG tablet Take 650 mg by mouth every 4 (four) hours as needed for mild pain (PRN for minor pain/headache x 48 hours. Notify MD of continued pain/headache. Do not exceed 3,000 mg in 24 hours. Document area of discomfort and effectiveness of medication.).  . [DISCONTINUED] dextromethorphan 7.5 MG/5ML SYRP Take 10 mLs by mouth every 6 (six) hours. Notify MD is cough continues.  . [DISCONTINUED] guaiFENesin (MUCINEX) 600 MG 12 hr tablet Take 600 mg by mouth 2  (two) times daily.  . [DISCONTINUED] loratadine (CLARITIN) 10 MG tablet Take 10 mg by mouth once a week.  . [DISCONTINUED] LORazepam (ATIVAN) 0.5 MG tablet Take 0.5 mg by mouth every 8 (eight) hours.   No facility-administered encounter medications on file as of 04/20/2017.     Review of Systems  Constitutional: Negative for appetite change, chills, diaphoresis, fatigue and fever.  HENT: Positive for hearing loss, rhinorrhea and trouble swallowing. Negative for congestion, ear discharge, ear pain, postnasal drip, sinus pressure, sinus pain and sore throat.        On thickened liquid  Eyes: Positive for visual disturbance.  Respiratory: Negative for cough, shortness of breath and wheezing.   Cardiovascular: Negative for chest pain, palpitations and leg swelling.  Gastrointestinal: Negative for abdominal pain, constipation, diarrhea, nausea and vomiting.  Genitourinary: Positive for frequency. Negative for dysuria.  Musculoskeletal: Positive for gait problem. Negative for back pain.  Skin: Negative for rash.  Neurological: Negative for dizziness, numbness and headaches.  Hematological: Bruises/bleeds easily.  Psychiatric/Behavioral: Negative for behavioral problems.    Vitals:   04/20/17 1505  BP: 118/70  Pulse: 70  Resp: 20  Temp: (!) 97.3 F (36.3 C)  TempSrc: Oral  SpO2: 97%  Weight: 154 lb 12.8 oz (70.2 kg)  Height: '5\' 8"'$  (1.727 m)   Body mass index is 23.54 kg/m.   Wt Readings from Last 3 Encounters:  04/20/17 154 lb 12.8 oz (70.2 kg)  03/21/17 159 lb (72.1 kg)  03/15/17 158 lb (71.7 kg)   Physical Exam  Constitutional: He is oriented to person, place, and time. He appears well-developed and well-nourished. No distress.  HENT:  Head: Normocephalic and atraumatic.  Mouth/Throat:  Oropharynx is clear and moist. No oropharyngeal exudate.  Eyes: Conjunctivae and EOM are normal. Pupils are equal, round, and reactive to light. Right eye exhibits no discharge. Left eye  exhibits no discharge.  Neck: Neck supple.  Cardiovascular: Normal rate and regular rhythm.  Pulmonary/Chest: Effort normal and breath sounds normal.  Left chest wall pacemaker  Abdominal: Soft. Bowel sounds are normal.  Musculoskeletal: He exhibits edema.  Trace leg edema, able to move all 4 extremities  Lymphadenopathy:    He has no cervical adenopathy.  Neurological: He is alert and oriented to person, place, and time.  Skin: Skin is warm and dry. He is not diaphoretic.  Easy bruising, dry skin  Psychiatric: He has a normal mood and affect. His behavior is normal.    Labs reviewed: Basic Metabolic Panel: Recent Labs    12/08/16 03/12/17 03/16/17  NA 134* 132* 133*  K 4.4 4.0 3.8  BUN 22* 29* 31*  CREATININE 1.0 1.1 1.2   Liver Function Tests: Recent Labs    06/15/16 09/29/16 03/12/17  AST '23 17 15  '$ ALT '16 11 10  '$ ALKPHOS 82 78 89   No results for input(s): LIPASE, AMYLASE in the last 8760 hours. No results for input(s): AMMONIA in the last 8760 hours. CBC: Recent Labs    11/03/16 03/12/17 03/16/17  WBC 6.1 11.0 10.5  HGB 15.5 14.7 14.1  HCT 46 43 40*  PLT 229 225 199   Cardiac Enzymes: No results for input(s): CKTOTAL, CKMB, CKMBINDEX, TROPONINI in the last 8760 hours. BNP: Invalid input(s): POCBNP Lab Results  Component Value Date   HGBA1C 6.1 11/17/2016   Lab Results  Component Value Date   TSH 1.69 11/03/2016   Lab Results  Component Value Date   [id number] 280 09/14/2016   No results found for: FOLATE No results found for: IRON, TIBC, FERRITIN  Lipid Panel: Recent Labs    10/18/16  CHOL 130  HDL 32*  LDLCALC 83  TRIG 70   Lab Results  Component Value Date   HGBA1C 6.1 11/17/2016    Procedures since last visit: No results found.  Assessment/Plan  CHF Continue losartan 25 mg daily, metoprolol tartrate 25 mg bid and furosemide 60 mg daily and 20 mg additional on daily basis if needed. Reviewed BMP. Monitor weight and edema. Followed  by cardiology  BPH With urinary frequency. C/w tamsulosin 0.4 mg daily  afib Controlled HR. Continue metoprolol tartrate 25 mg bid and eliquis for stroke prophylaxis.  Hypothyroidism Lab Results  Component Value Date   TSH 1.69 11/03/2016   C/w levothyroxine 125 mcg daily  CKD stage 3 With history of DM. Diet controlled for now. Check a1c. Monitor renal function.   Labs/tests ordered:  none  Communication: reviewed care plan with patient and charge nurse.     Blanchie Serve, MD Internal Medicine [facility] [address] [place], [place] [address] Cell Phone (Monday-Friday 8 am - 5 pm): [phone] On Call: [phone] and follow prompts after 5 pm and on weekends Office Phone: [phone] Office Fax: [phone]

## 2017-04-25 DIAGNOSIS — E1122 Type 2 diabetes mellitus with diabetic chronic kidney disease: Secondary | ICD-10-CM | POA: Diagnosis not present

## 2017-04-25 LAB — HEMOGLOBIN A1C: HEMOGLOBIN A1C: 6.9

## 2017-05-08 ENCOUNTER — Telehealth: Payer: Self-pay | Admitting: Internal Medicine

## 2017-05-08 NOTE — Telephone Encounter (Signed)
Biotears eye drop contain only wetting agents that do not have interaction with cardiac medications or contraindication from cardiovascular stand point.

## 2017-05-08 NOTE — Telephone Encounter (Signed)
Left message to call back  

## 2017-05-08 NOTE — Telephone Encounter (Signed)
New message     Patient daughter Lesleigh Noe wants to know if patient can use an over the counter eye drop called Biotears- patient is suffering from chronic dry eye , but it is recommended that they contact their cardiologist before using.

## 2017-05-09 NOTE — Telephone Encounter (Signed)
Information given to patient's daughter. Verbalized understanding.

## 2017-05-09 NOTE — Telephone Encounter (Signed)
Main interaction is with warfarin not with Eliquis  Patient may notice increase bruising due to curcumin and omega-3.

## 2017-05-09 NOTE — Telephone Encounter (Signed)
SPOKE WITH DAUGHTER .  DAUGHTER STATES THE SUPPLEMENT  IN QUESTION-- Biotears oral gel capsules By Biosyntrrx direction 2 gel capsules twice a day.  some the ingredients are  Cod liver oil, vitamins, magnesium, black ??, aloe vera leaves, green tea,  turmeric  daughter states the package states if patient is on anticoagulation discuss with phyisician. Aware will defer to pharmacist?

## 2017-05-12 DIAGNOSIS — H04123 Dry eye syndrome of bilateral lacrimal glands: Secondary | ICD-10-CM | POA: Diagnosis not present

## 2017-05-12 DIAGNOSIS — H018 Other specified inflammations of eyelid: Secondary | ICD-10-CM | POA: Diagnosis not present

## 2017-05-17 DIAGNOSIS — L814 Other melanin hyperpigmentation: Secondary | ICD-10-CM | POA: Diagnosis not present

## 2017-05-17 DIAGNOSIS — L570 Actinic keratosis: Secondary | ICD-10-CM | POA: Diagnosis not present

## 2017-05-17 DIAGNOSIS — D044 Carcinoma in situ of skin of scalp and neck: Secondary | ICD-10-CM | POA: Diagnosis not present

## 2017-06-06 ENCOUNTER — Ambulatory Visit (HOSPITAL_COMMUNITY)
Admission: RE | Admit: 2017-06-06 | Discharge: 2017-06-06 | Disposition: A | Discharge status: Home / Self Care | Payer: Medicare Other | Source: Ambulatory Visit | Attending: Cardiology | Admitting: Cardiology

## 2017-06-06 DIAGNOSIS — I714 Abdominal aortic aneurysm, without rupture, unspecified: Secondary | ICD-10-CM

## 2017-06-07 ENCOUNTER — Other Ambulatory Visit: Payer: Self-pay

## 2017-06-07 DIAGNOSIS — I719 Aortic aneurysm of unspecified site, without rupture: Secondary | ICD-10-CM

## 2017-06-07 DIAGNOSIS — I714 Abdominal aortic aneurysm, without rupture, unspecified: Secondary | ICD-10-CM

## 2017-06-20 ENCOUNTER — Telehealth: Payer: Self-pay | Admitting: Cardiology

## 2017-06-20 ENCOUNTER — Ambulatory Visit (INDEPENDENT_AMBULATORY_CARE_PROVIDER_SITE_OTHER): Payer: Medicare Other | Admitting: *Deleted

## 2017-06-20 DIAGNOSIS — I495 Sick sinus syndrome: Secondary | ICD-10-CM

## 2017-06-20 NOTE — Telephone Encounter (Signed)
Spoke with pt and reminded pt of remote transmission that is due today. Pt verbalized understanding.   

## 2017-06-21 NOTE — Progress Notes (Signed)
Remote pacemaker transmission.   

## 2017-06-22 ENCOUNTER — Encounter: Payer: Self-pay | Admitting: Cardiology

## 2017-07-03 LAB — CUP PACEART REMOTE DEVICE CHECK
Battery Voltage: 2.86 V
Brady Statistic RV Percent Paced: 41.2 %
Date Time Interrogation Session: 20190429164243
Implantable Lead Implant Date: 20111021
Implantable Lead Location: 753859
Implantable Pulse Generator Implant Date: 20111021
Lead Channel Impedance Value: 416 Ohm
Lead Channel Impedance Value: 424 Ohm
Lead Channel Sensing Intrinsic Amplitude: 6.8 mV
Lead Channel Setting Sensing Sensitivity: 0.9 mV
MDC IDC LEAD IMPLANT DT: 20111021
MDC IDC LEAD LOCATION: 753860
MDC IDC SET LEADCHNL RV PACING AMPLITUDE: 2.5 V
MDC IDC SET LEADCHNL RV PACING PULSEWIDTH: 0.4 ms

## 2017-07-07 DIAGNOSIS — H04123 Dry eye syndrome of bilateral lacrimal glands: Secondary | ICD-10-CM | POA: Diagnosis not present

## 2017-07-12 DIAGNOSIS — D044 Carcinoma in situ of skin of scalp and neck: Secondary | ICD-10-CM | POA: Diagnosis not present

## 2017-07-12 DIAGNOSIS — L570 Actinic keratosis: Secondary | ICD-10-CM | POA: Diagnosis not present

## 2017-07-12 DIAGNOSIS — D485 Neoplasm of uncertain behavior of skin: Secondary | ICD-10-CM | POA: Diagnosis not present

## 2017-07-12 DIAGNOSIS — L821 Other seborrheic keratosis: Secondary | ICD-10-CM | POA: Diagnosis not present

## 2017-07-12 DIAGNOSIS — L820 Inflamed seborrheic keratosis: Secondary | ICD-10-CM | POA: Diagnosis not present

## 2017-07-19 ENCOUNTER — Encounter: Payer: Self-pay | Admitting: Nurse Practitioner

## 2017-07-19 ENCOUNTER — Other Ambulatory Visit: Payer: Self-pay | Admitting: *Deleted

## 2017-07-19 ENCOUNTER — Non-Acute Institutional Stay: Payer: Medicare Other | Admitting: Nurse Practitioner

## 2017-07-19 DIAGNOSIS — I4819 Other persistent atrial fibrillation: Secondary | ICD-10-CM

## 2017-07-19 DIAGNOSIS — E1121 Type 2 diabetes mellitus with diabetic nephropathy: Secondary | ICD-10-CM

## 2017-07-19 DIAGNOSIS — H02102 Unspecified ectropion of right lower eyelid: Secondary | ICD-10-CM | POA: Diagnosis not present

## 2017-07-19 DIAGNOSIS — E871 Hypo-osmolality and hyponatremia: Secondary | ICD-10-CM | POA: Diagnosis not present

## 2017-07-19 DIAGNOSIS — Z7901 Long term (current) use of anticoagulants: Secondary | ICD-10-CM

## 2017-07-19 DIAGNOSIS — I10 Essential (primary) hypertension: Secondary | ICD-10-CM

## 2017-07-19 DIAGNOSIS — I481 Persistent atrial fibrillation: Secondary | ICD-10-CM

## 2017-07-19 DIAGNOSIS — E039 Hypothyroidism, unspecified: Secondary | ICD-10-CM

## 2017-07-19 DIAGNOSIS — I5022 Chronic systolic (congestive) heart failure: Secondary | ICD-10-CM

## 2017-07-19 NOTE — Progress Notes (Signed)
Location:  [place] Room Number: [place] of Service:  ALF [phone]) Provider:  Chantalle Defilippo, Lennie Odor  NP  Blanchie Serve, MD  Patient Care Team: Blanchie Serve, MD as PCP - General (Internal Medicine) [place], Nadean Corwin, MD as Consulting Physician (Cardiology) Ardis Hughs, MD as Attending Physician (Urology) Hiroko Tregre X, NP as Nurse Practitioner (Internal Medicine)  Extended Emergency Contact Information Primary Emergency Contact: [name] of [place] Phone: [phone] Relation: Daughter Secondary Emergency Contact: Eyvonne Mechanic [place] of [place] Phone: [phone] Mobile Phone: [phone] Relation: Daughter  Code Status:  DNR Goals of care: Advanced Directive information Advanced Directives 07/19/2017  Does Patient Have a Medical Advance Directive? Yes  Type of Paramedic of [place];Living will;Out of facility DNR (pink MOST or yellow form)  Does patient want to make changes to medical advance directive? No - Patient declined  Copy of Between in Chart? Yes  Pre-existing out of facility DNR order (yellow form or pink MOST form) Yellow form placed in chart (order not valid for inpatient use)     Chief Complaint  Patient presents with  . Medical Management of Chronic Issues    F/u- CHF,    HPI:  Pt is a 84 y.o. male seen today for medical management of chronic diseases.     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 He stated he has anticipated situational anxiety sometimes, likes to have Alprazolam 0.63m prn available to him. Hx of hypothyroidism, on Levothyroxine 1242m daily, last TSH 1.69 06/03/17. Afib, heart rate is in control, on Metoprolol 2543mid, Eliquis 2.5mg82md. Blood pressure is controlled on Losartan 25mg53md, Metoprolol 25mg 4m CHF compensated clinically, on Furosemide 60mg q64m0mg pr90mPast Medical History:  Diagnosis Date  . Abnormal PFTs 10/30/2012   Followed in Pulmonary clinic/ [facility]/ [place]  - PFT's 10/22/12 VC  55% no obst, DLCO 50%  - PFT's 12/20/2012 VC 83% and no obst, dLCO 55%  -11/08/2012  Walked RA x 3 laps @ 185 ft each stopped due to  End of study, not desat -11/08/12 esr 10    . Abnormality of gait 04/18/2013  . Acute on chronic systolic congestive heart failure, NYHA class 2 (HCC) 1/1[place]008   BNP 376.9 05/28/13   . Adjustment disorder with mixed anxiety and depressed mood 04/18/2013   Post CVA depression and anxiety   . Allergy   . Anxiety   . Aortic aneurysm of unspecified site without mention of rupture 10/27/2011  . Arthritis   . Atherosclerosis of renal artery (HCC) 8/1[place]008  . Atrial fibrillation (HCC) 12/[place]2012    He was on coumadin until his stroke last December and was switched to apixaban 2.5mg dail7mhich he has been taking faithfully without reported side effects.    . Balance disorder 07/31/2014  . Basal cell carcinoma of skin of other and unspecified parts of face 12/24/2009  . Blood transfusion without reported diagnosis   . Cardiac pacemaker in situ- MDT 10/11 03/18/2010   Medtronic revo implant in October 2011. Severe sinus bradycardia in the 30s, but not truly pacemaker dependent   . Cataract   . CKD stage 2 due to type 2 diabetes mellitus (HCC) 4/28[place]16  . Coronary artery disease   . CVA - Rt brain stroke 12/14 02/18/2013  02/19/13 angiography CT head:  Diffuse atherosclerotic irregularity and plaque formation of the distal right common carotid artery and proximal internal carotid artery without significant stenosis. Plaque ulceration is present and is a source of emboli. A small right thalamic CVA    . DM (diabetes mellitus), type 2 with renal complications ([place]) 5/85/2778  . Dyslipidemia 11/03/2004  . Fall 03/04/2011  . Fatigue 04/18/2013  .  Gout, unspecified 11/03/2004  . Heart murmur   . History of abdominal aortic aneurysm   . History of Doppler ultrasound 05/22/2012   LEAs; R anterior tibial artery appeared occluded; L posterior tibial shows short segment of occlusive ds  . History of Doppler ultrasound 05/22/2012   Abdominal Aortic Doppler; slight increase in fusiform aneurysm   . History of echocardiogram 12/2008   EF >55%; mild concentric LVH; mild MR; mild-mod TR; mild AV regurg;   . History of nuclear stress test 12/2010   lexiscan; low risk; compared to prior study, perfusion improved  . HTN (hypertension) 03/04/2011  . Hyperlipidemia   . Hypertension   . Hypertrophy of prostate with urinary obstruction and other lower urinary tract symptoms (LUTS) 04/28/2004  . Hyponatremia 03/04/2011  . Hypothyroidism 03/04/2011  . Impotence of organic origin 10/03/2000  . Internal hemorrhoids without mention of complication 24/23/5361  . Long term (current) use of anticoagulants 03/17/2011  . Major depressive disorder, single episode, unspecified 03/05/2009  . Memory change 01/24/2013  . Muscle weakness (generalized) 03/10/2011  . Nocturia 10/27/2011  . Obstructive sleep apnea (adult) (pediatric) 07/23/2008  . Open wound of knee, leg (except thigh), and ankle, complicated 4/43/1540  . Osteoarthritis of both knees 07/31/2014  . Osteoarthrosis, unspecified whether generalized or localized, unspecified site 02/05/2003  . PAF (paroxysmal atrial fibrillation) (HCC)    coumadin  . Pain in joint, lower leg 08/04/2011  . Pruritic condition 01/24/2013  . Quadriceps weakness 07/31/2014  . Right bundle branch block 04/19/2006  . 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 Spinal stenosis in cervical region 12/15/2004  . SSS (sick sinus syndrome) ([place]) 04/08/2014  . Stroke ([place])   . Thyroid disease   . TIA (transient ischemic attack) 04/30/2013  . Type II or unspecified type diabetes mellitus without  mention of complication, not stated as uncontrolled 11/24/2004  . Unspecified vitamin D deficiency 10/18/2006  . 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 Increased exposure of the right cornea.    Past Surgical History:  Procedure Laterality Date  . CORONARY ANGIOPLASTY WITH STENT PLACEMENT  2008   stent to SVG to OM  . CORONARY ARTERY BYPASS GRAFT  1989  . EYE SURGERY    . HERNIA REPAIR    . INSERT / REPLACE / REMOVE PACEMAKER    . JOINT REPLACEMENT    . PACEMAKER INSERTION  2011    Allergies  Allergen Reactions  . Quinolones Other (See Comments)    Risk of rupture of AAA  . Caduet [Amlodipine-Atorvastatin] Other (See Comments)    Weakness   . Crestor [Rosuvastatin] Other (See Comments)    Muscle pain/weakness  . Lipitor [Atorvastatin Calcium] Other (See Comments)    Muscle pain/weakness  . Simvastatin Other (See Comments)    Muscle pain/weakness  . Norvasc [Amlodipine]   . Lisinopril Other (See Comments)    Doesn't remember reaction    Outpatient Encounter Medications as of 07/19/2017  Medication Sig  . allopurinol (ZYLOPRIM) 100 MG tablet Take 50 mg  by mouth daily.   Marland Kitchen amoxicillin (AMOXIL) 500 MG capsule Take 500 mg by mouth. Take 4 caps prior to dental procedures  . apixaban (ELIQUIS) 2.5 MG TABS tablet Take 1 tablet (2.5 mg total) by mouth 2 (two) times daily.  Marland Kitchen artificial tears (LACRILUBE) OINT ophthalmic ointment Place 1 application into both eyes at bedtime.  . carboxymethylcellulose (REFRESH PLUS) 0.5 % SOLN Place 1 drop into both eyes 3 (three) times daily. May also do daily as needed  . Cholecalciferol 1000 units tablet Take 2,000 Units by mouth daily.  Marland Kitchen Dextran 70-Hypromellose 0.1-0.3 % SOLN Apply 1 drop to eye 2 (two) times daily as needed.  Marland Kitchen Dextran 70-Hypromellose 0.1-0.3 % SOLN Apply 1 drop to eye 3 (three) times daily. Both eyes  . dextromethorphan-guaiFENesin (ROBAFEN DM COUGH) 10-100 MG/5ML liquid Take 10 mLs by mouth every 6 (six)  hours as needed for cough.  . furosemide (LASIX) 20 MG tablet Take 60 mg by mouth daily.   . furosemide (LASIX) 20 MG tablet Take 20 mg by mouth as needed (weight gain > 3 lbs in 24 hours or >5 lbs in a week).  Marland Kitchen ipratropium-albuterol (DUONEB) 0.5-2.5 (3) MG/3ML SOLN Take 3 mLs by nebulization 4 (four) times daily as needed.  Marland Kitchen levothyroxine (SYNTHROID, LEVOTHROID) 125 MCG tablet Take 125 mcg by mouth daily before breakfast.  . losartan (COZAAR) 25 MG tablet Take 1 tablet (25 mg total) by mouth daily.  . meclizine (ANTIVERT) 25 MG tablet Take 25 mg by mouth every 6 (six) hours as needed.   . Menthol, Topical Analgesic, (BIOFREEZE) 4 % GEL Apply topically. Apply to left knee four times daily as needed for pain  . metoprolol tartrate (LOPRESSOR) 25 MG tablet Take 1 tablet (25 mg total) by mouth 2 (two) times daily.  Marland Kitchen Neomycin-Bacitracin-Polymyxin (TRIPLE ANTIBIOTIC) [phone] OINT Apply 1 application topically daily as needed (apply to bilateral nostrils).  . nitroGLYCERIN (NITROSTAT) 0.4 MG SL tablet Place 0.4 mg under the tongue every 5 (five) minutes as needed for chest pain.   Marland Kitchen olopatadine (PATANOL) 0.1 % ophthalmic solution Place 1 drop into both eyes 2 (two) times daily as needed for allergies.  . OXYGEN Inhale 2 L into the lungs continuous.  . polyethylene glycol (MIRALAX / GLYCOLAX) packet Take 17 g by mouth daily as needed.   . tamsulosin (FLOMAX) 0.4 MG CAPS capsule Take one cap by mouth daily  . tobramycin-dexamethasone (TOBRADEX) ophthalmic ointment Place 1 application into both eyes 2 (two) times daily as needed.   . triamcinolone cream (KENALOG) 0.1 % Apply 1 application topically daily.  Marland Kitchen UNABLE TO FIND Med Name: Hydrocortisone cream with applicator 1%. Apply to hemorrhoids 2 times daily as needed  . Wheat Dextrin (BENEFIBER) POWD Take 1 Scoop by mouth daily as needed.  . [DISCONTINUED] Lifitegrast (XIIDRA) 5 % SOLN Apply 1 drop to eye 2 (two) times daily.   No  facility-administered encounter medications on file as of 07/19/2017.     Review of Systems  Constitutional: Negative for activity change, appetite change, chills, diaphoresis, fatigue and fever.  HENT: Positive for hearing loss and trouble swallowing. Negative for congestion and voice change.        Thickened liquid-honey thick  Eyes: Positive for visual disturbance.       Sometime double vision in the right eye, comes and goes, under ophthalmology care .  Respiratory: Positive for cough. Negative for chest tightness, shortness of breath and wheezing.   Cardiovascular: Positive for leg swelling. Negative  for chest pain and palpitations.  Gastrointestinal: Negative for abdominal distention, abdominal pain, constipation, diarrhea, nausea and vomiting.  Genitourinary: Negative for difficulty urinating, dysuria and urgency.  Musculoskeletal: Positive for arthralgias and gait problem. Negative for joint swelling.  Skin: Positive for color change. Negative for wound.  Neurological: Positive for facial asymmetry. Negative for dizziness, speech difficulty and weakness.       Memory lapses  Psychiatric/Behavioral: Positive for decreased concentration. Negative for agitation, behavioral problems, confusion, hallucinations and sleep disturbance. The patient is nervous/anxious.     Immunization History  Administered Date(s) Administered  . Influenza Whole 12/06/2011, 12/19/2012  . Influenza-Unspecified 01/09/2014, 11/25/2014, 12/23/2015, 12/26/2016  . PPD Test 05/03/2013  . Pneumococcal Conjugate-13 01/03/2017  . Pneumococcal Polysaccharide-23 03/07/1986  . Td 04/20/2005  . Tdap 01/03/2017   Pertinent  Health Maintenance Due  Topic Date Due  . OPHTHALMOLOGY EXAM  01/12/1932  . FOOT EXAM  07/31/2015  . INFLUENZA VACCINE  10/05/2017  . HEMOGLOBIN A1C  10/23/2017  . PNA vac Low Risk Adult (2 of 2 - PPSV23) 01/03/2018   Fall Risk  11/15/2016 10/15/2015 06/11/2015 10/23/2014 07/07/2014  Falls in the  past year? No No Yes Yes Yes  Number falls in past yr: - - '1 1 1  ' Injury with Fall? - - Yes - No  Risk for fall due to : - - - - History of fall(s);Impaired mobility;Impaired balance/gait  Follow up - - - - Falls evaluation completed;Falls prevention discussed   Functional Status Survey:    Vitals:   07/19/17 0931  BP: 120/70  Pulse: 67  Temp: (!) 97.5 F (36.4 C)  SpO2: 97%  Weight: 158 lb 3.2 oz (71.8 kg)  Height: '5\' 8"'  (1.727 m)   Body mass index is 24.05 kg/m. Physical Exam  Constitutional: He appears well-developed and well-nourished.  HENT:  Head: Normocephalic and atraumatic.  Eyes: Pupils are equal, round, and reactive to light. EOM are normal.  Ectropion right eye, dry eyes  Neck: Normal range of motion. Neck supple. No JVD present. No thyromegaly present.  Cardiovascular: Normal rate and regular rhythm.  Murmur heard. Left upper chest pacemaker.   Pulmonary/Chest: Effort normal and breath sounds normal. He has no wheezes.  Posterior bibasilar rales.   Abdominal: Soft. Bowel sounds are normal. He exhibits no distension. There is no tenderness. There is no guarding.  Musculoskeletal: He exhibits edema.  Trace edema in BLE. S/p right TKR. Left knee crepitus noted. Ambulates with walker for a short distance, power chair to go further.    Neurological: He is alert. A cranial nerve deficit is present. He exhibits normal muscle tone. Coordination normal.  Oriented to person and place.   Skin: Skin is warm and dry.  Brownish pigmented BLE  Psychiatric: He has a normal mood and affect. His behavior is normal.    Labs reviewed: Recent Labs    03/12/17 03/16/17 07/20/17  NA 132* 133* 138  K 4.0 3.8 4.1  CL  --   --  102  CO2  --   --  29  BUN 29* 31* 28*  CREATININE 1.1 1.2 1.2  CALCIUM  --   --  9.3   Recent Labs    09/29/16 03/12/17 07/20/17  AST '17 15 16  ' ALT '11 10 11  ' ALKPHOS 78 89 84  ALBUMIN  --   --  3.9   Recent Labs    03/12/17 03/16/17 07/20/17    WBC 11.0 10.5 6.2  HGB 14.7 14.1 15.3  HCT 43 40* 45  PLT 225 199 212   Lab Results  Component Value Date   TSH 1.33 07/20/2017   Lab Results  Component Value Date   HGBA1C 6.9 04/25/2017   Lab Results  Component Value Date   CHOL 170 07/20/2017   HDL 39 07/20/2017   LDLCALC 110 07/20/2017   TRIG 107 07/20/2017   CHOLHDL 3.9 05/01/2013    Significant Diagnostic Results in last 30 days:  No results found.  Assessment/Plan Hypothyroidism Continue Levothyroxine 162mg daily, last TSH 1.69 11/03/16, update TSH   Ectropion of right eye Lower eye lid, dry/irritated right eye as a resultant of hx of CVA, he takes eye ointment at night, drops during day, under ophthalmology's care.   Anxiety He stated he has anticipated situational anxiety sometimes, likes to have Alprazolam 0.253mq6h prn available to him.   Hyponatremia Na in 130s in the past, update CMP  Atrial fibrillation (HCC) Afib, heart rate is in control, continue  Metoprolol 2542mid, Eliquis 2.5mg56md.  HTN (hypertension)  Blood pressure is controlled, continue Losartan 25mg49m, Metoprolol 25mg 54m Update Lipid panel.   Chronic systolic congestive heart failure (HCC) CHF compensated clinically, continue Furosemide 60mg q58m0mg pr55m  DM (diabetes mellitus), type 2 with renal complications (HCC) Diet controlled  Long term current use of anticoagulant therapy Continue Eliquis 2.5mg bid 71m update CBC     Family/ staff Communication: plan of care reviewed with the patient and charge nurse.   Labs/tests ordered: CBC CMP lipid panel TSH  Time spend 25 minutes.

## 2017-07-19 NOTE — Assessment & Plan Note (Signed)
Na in 130s in the past, update CMP

## 2017-07-19 NOTE — Assessment & Plan Note (Signed)
He stated he has anticipated situational anxiety sometimes, likes to have Alprazolam 0.25mg  q6h prn available to him.

## 2017-07-19 NOTE — Assessment & Plan Note (Signed)
Afib, heart rate is in control, continue  Metoprolol 25mg  bid, Eliquis 2.5mg  bid.

## 2017-07-19 NOTE — Assessment & Plan Note (Signed)
Continue Levothyroxine 139mcg daily, last TSH 1.69 11/03/16, update TSH

## 2017-07-19 NOTE — Assessment & Plan Note (Addendum)
Diet controlled.  

## 2017-07-19 NOTE — Assessment & Plan Note (Signed)
Lower eye lid, dry/irritated right eye as a resultant of hx of CVA, he takes eye ointment at night, drops during day, under ophthalmology's care.

## 2017-07-19 NOTE — Assessment & Plan Note (Signed)
Continue Eliquis 2.5mg  bid po, update CBC

## 2017-07-19 NOTE — Assessment & Plan Note (Signed)
Blood pressure is controlled, continue Losartan 25mg  bid, Metoprolol 25mg  bid. Update Lipid panel.

## 2017-07-19 NOTE — Assessment & Plan Note (Signed)
CHF compensated clinically, continue Furosemide 60mg  qd, 20mg  prn.

## 2017-07-19 NOTE — Progress Notes (Deleted)
Location:  [facility] Room Number: [place] of Service:  [id number] [id number]) Provider:  Mast, Lennie Odor  NP  Blanchie Serve, MD  Patient Care Team: Blanchie Serve, MD as PCP - General (Internal Medicine) [place], Nadean Corwin, MD as Consulting Physician (Cardiology) Ardis Hughs, MD as Attending Physician (Urology) Mast, Man X, NP as Nurse Practitioner (Internal Medicine)  Extended Emergency Contact Information Primary Emergency Contact: [name] of [place] Phone: [phone] Relation: Daughter Secondary Emergency Contact: Eyvonne Mechanic [place] of [place] Phone: [phone] Mobile Phone: [phone] Relation: Daughter  Code Status:  DNR Goals of care: Advanced Directive information Advanced Directives 07/19/2017  Does Patient Have a Medical Advance Directive? Yes  Type of Paramedic of [place];Living will;Out of facility DNR (pink MOST or yellow form)  Does patient want to make changes to medical advance directive? No - Patient declined  Copy of [place] in Chart? Yes  Pre-existing out of facility DNR order (yellow form or pink MOST form) Yellow form placed in chart (order not valid for inpatient use)     Chief Complaint  Patient presents with  . Medical Management of Chronic Issues    F/u- CAD, mixed anxiety and depression    HPI:  Pt is a 84 y.o. [name] seen today for medical management of chronic diseases.     Past Medical History:  Diagnosis Date  . Abnormal PFTs 10/30/2012   Followed in Pulmonary clinic/ [facility]/ [place]  - PFT's 10/22/12 VC  55% no obst, DLCO 50%  - PFT's 12/20/2012 VC 83% and no obst, dLCO 55%  -11/08/2012  Walked RA x 3 laps @ 185 ft each stopped due to  End of study, not desat -11/08/12 esr 10    . Abnormality of gait 04/18/2013  . Acute on chronic systolic congestive heart failure, NYHA class 2 ([place]) 03/16/2006   BNP  376.9 05/28/13   . Adjustment disorder with mixed anxiety and depressed mood 04/18/2013   Post CVA depression and anxiety   . Allergy   . Anxiety   . Aortic aneurysm of unspecified site without mention of rupture 10/27/2011  . Arthritis   . Atherosclerosis of renal artery ([place]) 10/18/2006  . Atrial fibrillation ([place]) 03/04/2011    He was on coumadin until his stroke last December and was switched to apixaban 2.'5mg'$  daily which he has been taking faithfully without reported side effects.    . Balance disorder 07/31/2014  . Basal cell carcinoma of skin of other and unspecified parts of face 12/24/2009  . Blood transfusion without reported diagnosis   . Cardiac pacemaker in situ- MDT 10/11 03/18/2010   Medtronic revo implant in October 2011. Severe sinus bradycardia in the 30s, but not truly pacemaker dependent   . Cataract   . CKD stage 2 due to type 2 diabetes mellitus ([place]) 07/03/2014  . Coronary artery disease   . CVA - Rt brain stroke 12/14 02/18/2013   02/19/13 angiography CT head:  Diffuse atherosclerotic irregularity and plaque formation of the distal right common carotid artery and proximal internal carotid artery without significant stenosis. Plaque ulceration is present and is a source of emboli. A small right thalamic CVA    . DM (diabetes mellitus), type 2 with renal complications ([place]) 8/41/3244  . Dyslipidemia 11/03/2004  . Fall 03/04/2011  . Fatigue 04/18/2013  . Gout, unspecified 11/03/2004  . Heart murmur   . History of abdominal aortic aneurysm   .  History of Doppler ultrasound 05/22/2012   LEAs; R anterior tibial artery appeared occluded; L posterior tibial shows short segment of occlusive ds  . History of Doppler ultrasound 05/22/2012   Abdominal Aortic Doppler; slight increase in fusiform aneurysm   . History of echocardiogram 12/2008   EF >55%; mild concentric LVH; mild MR; mild-mod TR; mild AV regurg;   . History of nuclear stress test 12/2010   lexiscan; low risk; compared  to prior study, perfusion improved  . HTN (hypertension) 03/04/2011  . Hyperlipidemia   . Hypertension   . Hypertrophy of prostate with urinary obstruction and other lower urinary tract symptoms (LUTS) 04/28/2004  . Hyponatremia 03/04/2011  . Hypothyroidism 03/04/2011  . Impotence of organic origin 10/03/2000  . Internal hemorrhoids without mention of complication 10/18/4816  . Long term (current) use of anticoagulants 03/17/2011  . Major depressive disorder, single episode, unspecified 03/05/2009  . Memory change 01/24/2013  . Muscle weakness (generalized) 03/10/2011  . Nocturia 10/27/2011  . Obstructive sleep apnea (adult) (pediatric) 07/23/2008  . Open wound of knee, leg (except thigh), and ankle, complicated 5/63/1497  . Osteoarthritis of both knees 07/31/2014  . Osteoarthrosis, unspecified whether generalized or localized, unspecified site 02/05/2003  . PAF (paroxysmal atrial fibrillation) (H[facility])    coumadin  . Pain in joint, lower leg 08/04/2011  . Pruritic condition 01/24/2013  . Quadriceps weakness 07/31/2014  . Right bundle branch block 04/19/2006  . 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 Spinal stenosis in cervical region 12/15/2004  . SSS (sick sinus syndrome) ([place]) 04/08/2014  . Stroke ([place])   . Thyroid disease   . TIA (transient ischemic attack) 04/30/2013  . Type II or unspecified type diabetes mellitus without mention of complication, not stated as uncontrolled 11/24/2004  . Unspecified vitamin D deficiency 10/18/2006  . 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 Increased exposure of the right cornea.    Past Surgical History:  Procedure Laterality Date  . CORONARY ANGIOPLASTY WITH STENT PLACEMENT  2008   stent to SVG to OM  . CORONARY ARTERY BYPASS GRAFT  1989  . EYE SURGERY    . HERNIA REPAIR    . INSERT / REPLACE / REMOVE PACEMAKER    . JOINT REPLACEMENT    . PACEMAKER INSERTION  2011    Allergies    Allergen Reactions  . Quinolones Other (See Comments)    Risk of rupture of AAA  . Caduet [Amlodipine-Atorvastatin] Other (See Comments)    Weakness   . Crestor [Rosuvastatin] Other (See Comments)    Muscle pain/weakness  . Lipitor [Atorvastatin Calcium] Other (See Comments)    Muscle pain/weakness  . Simvastatin Other (See Comments)    Muscle pain/weakness  . Norvasc [Amlodipine]   . Lisinopril Other (See Comments)    Doesn't remember reaction    Outpatient Encounter Medications as of 07/19/2017  Medication Sig  . allopurinol (ZYLOPRIM) 100 MG tablet Take 50 mg by mouth daily.   Marland Kitchen amoxicillin (AMOXIL) 500 MG capsule Take 500 mg by mouth. Take 4 caps prior to dental procedures  . apixaban (ELIQUIS) 2.5 MG TABS tablet Take 1 tablet (2.5 mg total) by mouth 2 (two) times daily.  Marland Kitchen artificial tears (LACRILUBE) OINT ophthalmic ointment Place 1 application into both eyes at bedtime.  . carboxymethylcellulose (REFRESH PLUS) 0.5 % SOLN Place 1 drop into both eyes 3 (three) times daily. May also do daily as needed  . Cholecalciferol  1000 units tablet Take 2,000 Units by mouth daily.  Marland Kitchen Dextran 70-Hypromellose 0.1-0.3 % SOLN Apply 1 drop to eye 2 (two) times daily as needed.  Marland Kitchen Dextran 70-Hypromellose 0.1-0.3 % SOLN Apply 1 drop to eye 3 (three) times daily. Both eyes  . dextromethorphan-guaiFENesin (ROBAFEN DM COUGH) 10-100 MG/5ML liquid Take 10 mLs by mouth every 6 (six) hours as needed for cough.  . furosemide (LASIX) 20 MG tablet Take 60 mg by mouth daily.   . furosemide (LASIX) 20 MG tablet Take 20 mg by mouth as needed (weight gain > 3 lbs in 24 hours or >5 lbs in a week).  Marland Kitchen ipratropium-albuterol (DUONEB) 0.5-2.5 (3) MG/3ML SOLN Take 3 mLs by nebulization 4 (four) times daily as needed.  Marland Kitchen levothyroxine (SYNTHROID, LEVOTHROID) 125 MCG tablet Take 125 mcg by mouth daily before breakfast.  . losartan (COZAAR) 25 MG tablet Take 1 tablet (25 mg total) by mouth daily.  . meclizine  (ANTIVERT) 25 MG tablet Take 25 mg by mouth every 6 (six) hours as needed.   . Menthol, Topical Analgesic, (BIOFREEZE) 4 % GEL Apply topically. Apply to left knee four times daily as needed for pain  . metoprolol tartrate (LOPRESSOR) 25 MG tablet Take 1 tablet (25 mg total) by mouth 2 (two) times daily.  Marland Kitchen Neomycin-Bacitracin-Polymyxin (TRIPLE ANTIBIOTIC) [phone] OINT Apply 1 application topically daily as needed (apply to bilateral nostrils).  . nitroGLYCERIN (NITROSTAT) 0.4 MG SL tablet Place 0.4 mg under the tongue every 5 (five) minutes as needed for chest pain.   Marland Kitchen olopatadine (PATANOL) 0.1 % ophthalmic solution Place 1 drop into both eyes 2 (two) times daily as needed for allergies.  . OXYGEN Inhale 2 L into the lungs continuous.  . polyethylene glycol (MIRALAX / GLYCOLAX) packet Take 17 g by mouth daily as needed.   . tamsulosin (FLOMAX) 0.4 MG CAPS capsule Take one cap by mouth daily  . tobramycin-dexamethasone (TOBRADEX) ophthalmic ointment Place 1 application into both eyes 2 (two) times daily as needed.   . triamcinolone cream (KENALOG) 0.1 % Apply 1 application topically daily.  Marland Kitchen UNABLE TO FIND Med Name: Hydrocortisone cream with applicator 1%. Apply to hemorrhoids 2 times daily as needed  . Wheat Dextrin (BENEFIBER) POWD Take 1 Scoop by mouth daily as needed.  . [DISCONTINUED] Lifitegrast (XIIDRA) 5 % SOLN Apply 1 drop to eye 2 (two) times daily.   No facility-administered encounter medications on file as of 07/19/2017.     Review of Systems  Immunization History  Administered Date(s) Administered  . Influenza Whole 12/06/2011, 12/19/2012  . Influenza-Unspecified 01/09/2014, 11/25/2014, 12/23/2015, 12/26/2016  . PPD Test 05/03/2013  . Pneumococcal Conjugate-13 01/03/2017  . Pneumococcal Polysaccharide-23 03/07/1986  . Td 04/20/2005  . Tdap 01/03/2017   Pertinent  Health Maintenance Due  Topic Date Due  . OPHTHALMOLOGY EXAM  01/12/1932  . FOOT EXAM  07/31/2015  .  HEMOGLOBIN A1C  05/17/2017  . INFLUENZA VACCINE  10/05/2017  . PNA vac Low Risk Adult (2 of 2 - PPSV23) 01/03/2018   Fall Risk  11/15/2016 10/15/2015 06/11/2015 10/23/2014 07/07/2014  Falls in the past year? No No Yes Yes Yes  Number falls in past yr: - - '1 1 1  '$ Injury with Fall? - - Yes - No  Risk for fall due to : - - - - History of fall(s);Impaired mobility;Impaired balance/gait  Follow up - - - - Falls evaluation completed;Falls prevention discussed   Functional Status Survey:    Vitals:   07/19/17  0931  BP: 120/70  Pulse: 67  Temp: (!) 97.5 F (36.4 C)  SpO2: 97%  Weight: 158 lb 3.2 oz (71.8 kg)  Height: '5\' 8"'$  (1.727 m)   Body mass index is 24.05 kg/m. Physical Exam  Labs reviewed: Recent Labs    12/08/16 03/12/17 03/16/17  NA 134* 132* 133*  K 4.4 4.0 3.8  BUN 22* 29* Jon*  CREATININE 1.0 1.1 1.2   Recent Labs    09/29/16 03/12/17  AST 17 15  ALT 11 10  ALKPHOS 78 89   Recent Labs    11/03/16 03/12/17 03/16/17  WBC 6.1 11.0 10.5  HGB 15.5 14.7 14.1  HCT 46 43 40*  PLT 229 225 199   Lab Results  Component Value Date   TSH 1.69 11/03/2016   Lab Results  Component Value Date   HGBA1C 6.1 11/17/2016   Lab Results  Component Value Date   CHOL 130 10/18/2016   HDL 32 (A) 10/18/2016   LDLCALC 83 10/18/2016   TRIG 70 10/18/2016   CHOLHDL 3.9 05/01/2013    Significant Diagnostic Results in last 30 days:  No results found.  Assessment/Plan There are no diagnoses linked to this encounter.   Family/ staff Communication:   Labs/tests ordered:

## 2017-07-20 DIAGNOSIS — E039 Hypothyroidism, unspecified: Secondary | ICD-10-CM | POA: Diagnosis not present

## 2017-07-20 DIAGNOSIS — E1122 Type 2 diabetes mellitus with diabetic chronic kidney disease: Secondary | ICD-10-CM | POA: Diagnosis not present

## 2017-07-20 LAB — CBC AND DIFFERENTIAL
HCT: 45 (ref 41–53)
Hemoglobin: 15.3 (ref 13.5–17.5)
Platelets: 212 (ref 150–399)
WBC: 6.2

## 2017-07-20 LAB — LIPID PANEL
Cholesterol: 170 (ref 0–200)
HDL: 39 (ref 35–70)
LDL Cholesterol: 110
LDL/HDL RATIO: 4.4
Triglycerides: 107 (ref 40–160)

## 2017-07-20 LAB — BASIC METABOLIC PANEL WITH GFR
BUN: 28 — AB (ref 4–21)
Creatinine: 1.2 (ref <=1.3)
Glucose: 129
Potassium: 4.1 (ref 3.4–5.3)
Sodium: 138 (ref 137–147)

## 2017-07-20 LAB — HEPATIC FUNCTION PANEL
ALK PHOS: 84 (ref 25–125)
ALT: 11 (ref 10–40)
AST: 16 (ref 14–40)
Bilirubin, Total: 0.8

## 2017-07-20 LAB — TSH: TSH: 1.33 (ref <=5.90)

## 2017-07-21 ENCOUNTER — Other Ambulatory Visit: Payer: Self-pay | Admitting: *Deleted

## 2017-07-21 LAB — COMPREHENSIVE METABOLIC PANEL
ALBUMIN: 3.9
CALCIUM: 9.3
CO2: 29
Chloride: 102
EGFR (Non-African Amer.): 53
GLOBULIN: 2.5
Protein: 6.4

## 2017-07-25 ENCOUNTER — Encounter: Payer: Self-pay | Admitting: *Deleted

## 2017-07-27 ENCOUNTER — Encounter: Payer: Self-pay | Admitting: *Deleted

## 2017-08-16 DIAGNOSIS — L570 Actinic keratosis: Secondary | ICD-10-CM | POA: Diagnosis not present

## 2017-08-16 DIAGNOSIS — D044 Carcinoma in situ of skin of scalp and neck: Secondary | ICD-10-CM | POA: Diagnosis not present

## 2017-08-16 DIAGNOSIS — L814 Other melanin hyperpigmentation: Secondary | ICD-10-CM | POA: Diagnosis not present

## 2017-09-13 DIAGNOSIS — L814 Other melanin hyperpigmentation: Secondary | ICD-10-CM | POA: Diagnosis not present

## 2017-09-20 ENCOUNTER — Ambulatory Visit (INDEPENDENT_AMBULATORY_CARE_PROVIDER_SITE_OTHER): Payer: Medicare Other | Admitting: *Deleted

## 2017-09-20 DIAGNOSIS — I495 Sick sinus syndrome: Secondary | ICD-10-CM | POA: Diagnosis not present

## 2017-09-20 NOTE — Progress Notes (Signed)
Remote pacemaker transmission.   

## 2017-09-21 ENCOUNTER — Encounter: Payer: Self-pay | Admitting: Cardiology

## 2017-09-21 NOTE — Progress Notes (Signed)
Letter  

## 2017-09-28 LAB — CUP PACEART REMOTE DEVICE CHECK
Battery Voltage: 2.85 V
Date Time Interrogation Session: 20190715195056
Implantable Lead Location: 753859
Implantable Pulse Generator Implant Date: 20111021
Lead Channel Impedance Value: 408 Ohm
Lead Channel Setting Pacing Amplitude: 2.5 V
Lead Channel Setting Sensing Sensitivity: 0.9 mV
MDC IDC LEAD IMPLANT DT: 20111021
MDC IDC LEAD IMPLANT DT: 20111021
MDC IDC LEAD LOCATION: 753860
MDC IDC MSMT LEADCHNL RV IMPEDANCE VALUE: 432 Ohm
MDC IDC SET LEADCHNL RV PACING PULSEWIDTH: 0.4 ms
MDC IDC STAT BRADY RV PERCENT PACED: 49.97 %

## 2017-10-04 ENCOUNTER — Non-Acute Institutional Stay: Payer: Medicare Other | Admitting: Internal Medicine

## 2017-10-04 ENCOUNTER — Encounter: Payer: Self-pay | Admitting: Internal Medicine

## 2017-10-04 DIAGNOSIS — R5383 Other fatigue: Secondary | ICD-10-CM | POA: Diagnosis not present

## 2017-10-04 DIAGNOSIS — R630 Anorexia: Secondary | ICD-10-CM | POA: Diagnosis not present

## 2017-10-04 DIAGNOSIS — R059 Cough, unspecified: Secondary | ICD-10-CM

## 2017-10-04 DIAGNOSIS — R05 Cough: Secondary | ICD-10-CM

## 2017-10-04 NOTE — Progress Notes (Signed)
Location:  [place] Room Number: [place] of Service:  ALF [phone]) Provider:  Blanchie Serve, MD  Blanchie Serve, MD  Patient Care Team: Blanchie Serve, MD as PCP - General (Internal Medicine) [place], Nadean Corwin, MD as Consulting Physician (Cardiology) Ardis Hughs, MD as Attending Physician (Urology) Mast, Man X, NP as Nurse Practitioner (Internal Medicine)  Extended Emergency Contact Information Primary Emergency Contact: [name] of [place] Phone: [phone] Relation: Daughter Secondary Emergency Contact: Eyvonne Mechanic [place] of [place] Phone: [phone] Mobile Phone: [phone] Relation: Daughter   Goals of care: Advanced Directive information Advanced Directives 10/04/2017  Does Patient Have a Medical Advance Directive? Yes  Type of Paramedic of [place];Living will;Out of facility DNR (pink MOST or yellow form)  Does patient want to make changes to medical advance directive? No - Patient declined  Copy of [place] in Chart? Yes  Pre-existing out of facility DNR order (yellow form or pink MOST form) Yellow form placed in chart (order not valid for inpatient use)     Chief Complaint  Patient presents with  . Acute Visit    Not feeling well     HPI:  Pt is a 84 y.o. male seen today for an acute visit for "NOT FEELING WELL" per nursing. Pt seen in room with charge nurse present. He has lost his appetite for 2 days. He complaints of dry cough for 2-3 days, unable to bring phlegm up. Denies sore throat or trouble swallowing. Has been having chronic rhinorrhea with clear nasal discharge, denies worsening of this.    Past Medical History:  Diagnosis Date  . Abnormal PFTs 10/30/2012   Followed in Pulmonary clinic/ [facility]/ [place]  - PFT's 10/22/12 VC  55% no obst, DLCO 50%  - PFT's 12/20/2012 VC 83% and no obst, dLCO 55%   -11/08/2012  Walked RA x 3 laps @ 185 ft each stopped due to  End of study, not desat -11/08/12 esr 10    . Abnormality of gait 04/18/2013  . Acute on chronic systolic congestive heart failure, NYHA class 2 ([place]) 03/16/2006   BNP 376.9 05/28/13   . Adjustment disorder with mixed anxiety and depressed mood 04/18/2013   Post CVA depression and anxiety   . Allergy   . Anxiety   . Aortic aneurysm of unspecified site without mention of rupture 10/27/2011  . Arthritis   . Atherosclerosis of renal artery ([place]) 10/18/2006  . Atrial fibrillation ([place]) 03/04/2011    He was on coumadin until his stroke last December and was switched to apixaban 2.45m daily which he has been taking faithfully without reported side effects.    . Balance disorder 07/31/2014  . Basal cell carcinoma of skin of other and unspecified parts of face 12/24/2009  . Blood transfusion without reported diagnosis   . Cardiac pacemaker in situ- MDT 10/11 03/18/2010   Medtronic revo implant in October 2011. Severe sinus bradycardia in the 30s, but not truly pacemaker dependent   . Cataract   . CKD stage 2 due to type 2 diabetes mellitus ([place] 07/03/2014  . Coronary artery disease   . CVA - Rt brain stroke 12/14 02/18/2013   02/19/13 angiography CT head:  Diffuse atherosclerotic irregularity and plaque formation of the distal right common carotid artery and proximal internal carotid artery without significant stenosis. Plaque ulceration is present and is a source of emboli. A small right thalamic CVA    .  DM (diabetes mellitus), type 2 with renal complications ([place]) 8/54/6270  . Dyslipidemia 11/03/2004  . Fall 03/04/2011  . Fatigue 04/18/2013  . Gout, unspecified 11/03/2004  . Heart murmur   . History of abdominal aortic aneurysm   . History of Doppler ultrasound 05/22/2012   LEAs; R anterior tibial artery appeared occluded; L posterior tibial shows short segment of occlusive ds  . History of Doppler ultrasound 05/22/2012   Abdominal Aortic  Doppler; slight increase in fusiform aneurysm   . History of echocardiogram 12/2008   EF >55%; mild concentric LVH; mild MR; mild-mod TR; mild AV regurg;   . History of nuclear stress test 12/2010   lexiscan; low risk; compared to prior study, perfusion improved  . HTN (hypertension) 03/04/2011  . Hyperlipidemia   . Hypertension   . Hypertrophy of prostate with urinary obstruction and other lower urinary tract symptoms (LUTS) 04/28/2004  . Hyponatremia 03/04/2011  . Hypothyroidism 03/04/2011  . Impotence of organic origin 10/03/2000  . Internal hemorrhoids without mention of complication 35/00/9381  . Long term (current) use of anticoagulants 03/17/2011  . Major depressive disorder, single episode, unspecified 03/05/2009  . Memory change 01/24/2013  . Muscle weakness (generalized) 03/10/2011  . Nocturia 10/27/2011  . Obstructive sleep apnea (adult) (pediatric) 07/23/2008  . Open wound of knee, leg (except thigh), and ankle, complicated 11/02/9369  . Osteoarthritis of both knees 07/31/2014  . Osteoarthrosis, unspecified whether generalized or localized, unspecified site 02/05/2003  . PAF (paroxysmal atrial fibrillation) (HCC)    coumadin  . Pain in joint, lower leg 08/04/2011  . Pruritic condition 01/24/2013  . Quadriceps weakness 07/31/2014  . Right bundle branch block 04/19/2006  . 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 Spinal stenosis in cervical region 12/15/2004  . SSS (sick sinus syndrome) ([place]) 04/08/2014  . Stroke ([place])   . Thyroid disease   . TIA (transient ischemic attack) 04/30/2013  . Type II or unspecified type diabetes mellitus without mention of complication, not stated as uncontrolled 11/24/2004  . Unspecified vitamin D deficiency 10/18/2006  . 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 Increased exposure of the right cornea.    Past Surgical History:  Procedure Laterality Date  . CORONARY ANGIOPLASTY WITH  STENT PLACEMENT  2008   stent to SVG to OM  . CORONARY ARTERY BYPASS GRAFT  1989  . EYE SURGERY    . HERNIA REPAIR    . INSERT / REPLACE / REMOVE PACEMAKER    . JOINT REPLACEMENT    . PACEMAKER INSERTION  2011    Allergies  Allergen Reactions  . Quinolones Other (See Comments)    Risk of rupture of AAA  . Caduet [Amlodipine-Atorvastatin] Other (See Comments)    Weakness   . Crestor [Rosuvastatin] Other (See Comments)    Muscle pain/weakness  . Lipitor [Atorvastatin Calcium] Other (See Comments)    Muscle pain/weakness  . Simvastatin Other (See Comments)    Muscle pain/weakness  . Norvasc [Amlodipine]   . Lisinopril Other (See Comments)    Doesn't remember reaction    Outpatient Encounter Medications as of 10/04/2017  Medication Sig  . allopurinol (ZYLOPRIM) 100 MG tablet Take 50 mg by mouth daily.   Marland Kitchen ALPRAZolam (XANAX) 0.25 MG tablet Take 0.25 mg by mouth every 6 (six) hours as needed for anxiety.  Marland Kitchen amoxicillin (AMOXIL) 500 MG capsule Take 500 mg by mouth. Take 4 caps prior to dental procedures  .  apixaban (ELIQUIS) 2.5 MG TABS tablet Take 1 tablet (2.5 mg total) by mouth 2 (two) times daily.  . Cholecalciferol 1000 units tablet Take 2,000 Units by mouth daily.  Marland Kitchen Dextran 70-Hypromellose 0.1-0.3 % SOLN Apply 1 drop to eye daily.   Marland Kitchen dextromethorphan-guaiFENesin (ROBAFEN DM COUGH) 10-100 MG/5ML liquid Take 10 mLs by mouth every 6 (six) hours as needed for cough.  . furosemide (LASIX) 20 MG tablet Take 60 mg by mouth daily.   . furosemide (LASIX) 20 MG tablet Take 20 mg by mouth as needed (weight gain > 3 lbs in 24 hours or >5 lbs in a week).  Marland Kitchen ipratropium (ATROVENT) 0.03 % nasal spray Place 1 spray into both nostrils 3 (three) times daily as needed for rhinitis.  Marland Kitchen ipratropium-albuterol (DUONEB) 0.5-2.5 (3) MG/3ML SOLN Take 3 mLs by nebulization 4 (four) times daily as needed.  Marland Kitchen levothyroxine (SYNTHROID, LEVOTHROID) 125 MCG tablet Take 125 mcg by mouth daily before  breakfast.  . losartan (COZAAR) 25 MG tablet Take 1 tablet (25 mg total) by mouth daily.  . meclizine (ANTIVERT) 25 MG tablet Take 25 mg by mouth every 6 (six) hours as needed.   . Menthol, Topical Analgesic, (BIOFREEZE) 4 % GEL Apply topically. Apply to left knee four times daily as needed for pain  . metoprolol tartrate (LOPRESSOR) 25 MG tablet Take 1 tablet (25 mg total) by mouth 2 (two) times daily.  Marland Kitchen Neomycin-Bacitracin-Polymyxin (TRIPLE ANTIBIOTIC) 3.5-2024141572 OINT Apply 1 application topically daily as needed (apply to bilateral nostrils).  . nitroGLYCERIN (NITROSTAT) 0.4 MG SL tablet Place 0.4 mg under the tongue every 5 (five) minutes as needed for chest pain.   . polyethylene glycol (MIRALAX / GLYCOLAX) packet Take 17 g by mouth daily as needed.   . triamcinolone cream (KENALOG) 0.1 % Apply 1 application topically daily.  Marland Kitchen UNABLE TO FIND Med Name: Hydrocortisone cream with applicator 1%. Apply to hemorrhoids 2 times daily as needed  . [DISCONTINUED] Dextran 70-Hypromellose 0.1-0.3 % SOLN Apply 1 drop to eye 3 (three) times daily. Both eyes  . [DISCONTINUED] OXYGEN Inhale 2 L into the lungs continuous.  . [DISCONTINUED] Wheat Dextrin (BENEFIBER) POWD Take 1 Scoop by mouth daily as needed.   No facility-administered encounter medications on file as of 10/04/2017.     Review of Systems  Constitutional: Positive for appetite change, chills and fatigue. Negative for activity change, diaphoresis and fever.  HENT: Positive for rhinorrhea. Negative for congestion, ear discharge, ear pain, mouth sores, postnasal drip, sinus pressure, sinus pain, sore throat, trouble swallowing and voice change.   Eyes: Positive for visual disturbance.  Respiratory: Positive for cough. Negative for shortness of breath and wheezing.   Cardiovascular: Negative for chest pain and palpitations.  Gastrointestinal: Negative for abdominal pain, diarrhea, nausea and vomiting.  Genitourinary: Negative for dysuria.    Musculoskeletal: Positive for gait problem.  Skin: Negative for rash.  Neurological: Negative for light-headedness and headaches.  Psychiatric/Behavioral: Negative for behavioral problems and confusion.    Immunization History  Administered Date(s) Administered  . Influenza Whole 12/06/2011, 12/19/2012  . Influenza-Unspecified 01/09/2014, 11/25/2014, 12/23/2015, 12/26/2016  . PPD Test 05/03/2013  . Pneumococcal Conjugate-13 01/03/2017  . Pneumococcal Polysaccharide-23 03/07/1986  . Td 04/20/2005  . Tdap 01/03/2017   Pertinent  Health Maintenance Due  Topic Date Due  . OPHTHALMOLOGY EXAM  01/12/1932  . FOOT EXAM  07/31/2015  . INFLUENZA VACCINE  10/05/2017  . HEMOGLOBIN A1C  10/23/2017  . PNA vac Low Risk Adult (2 of  2 - PPSV23) 01/03/2018   Fall Risk  11/15/2016 10/15/2015 06/11/2015 10/23/2014 07/07/2014  Falls in the past year? No No Yes Yes Yes  Number falls in past yr: - - '1 1 1  ' Injury with Fall? - - Yes - No  Risk for fall due to : - - - - History of fall(s);Impaired mobility;Impaired balance/gait  Follow up - - - - Falls evaluation completed;Falls prevention discussed   Functional Status Survey:    Vitals:   10/04/17 1414  BP: 118/70  Pulse: 85  Resp: 18  Temp: (!) 97.3 F (36.3 C)  TempSrc: Oral  SpO2: 96%  Weight: 159 lb 12.8 oz (72.5 kg)  Height: '5\' 8"'  (1.727 m)   Body mass index is 24.3 kg/m. Physical Exam  Constitutional: He is oriented to person, place, and time. He appears well-developed and well-nourished. No distress.  HENT:  Head: Normocephalic and atraumatic.  Nose: Nose normal.  Mouth/Throat: Oropharynx is clear and moist. No oropharyngeal exudate.  Eyes: Pupils are equal, round, and reactive to light. Conjunctivae and EOM are normal. Right eye exhibits no discharge. Left eye exhibits no discharge.  Wears corrective glasses  Neck: Normal range of motion. Neck supple.  Cardiovascular: Normal rate and regular rhythm.  Murmur  heard. Pulmonary/Chest: Effort normal. No respiratory distress. He has no wheezes. He has rales.  Poor air entry to lung bases  Abdominal: Soft. Bowel sounds are normal. There is no tenderness. There is no guarding.  Musculoskeletal: Normal range of motion. He exhibits edema.  Uses motorized wheelchair for ambulation, trace leg edema  Lymphadenopathy:    He has no cervical adenopathy.  Neurological: He is alert and oriented to person, place, and time.  Skin: Skin is warm and dry. He is not diaphoretic.  Psychiatric: He has a normal mood and affect.    Labs reviewed: Recent Labs    03/12/17 03/16/17 07/20/17  NA 132* 133* 138  K 4.0 3.8 4.1  CL  --   --  102  CO2  --   --  29  BUN 29* 31* 28*  CREATININE 1.1 1.2 1.2  CALCIUM  --   --  9.3   Recent Labs    03/12/17 07/20/17  AST 15 16  ALT 10 11  ALKPHOS 89 84  ALBUMIN  --  3.9   Recent Labs    03/12/17 03/16/17 07/20/17  WBC 11.0 10.5 6.2  HGB 14.7 14.1 15.3  HCT 43 40* 45  PLT 225 199 212   Lab Results  Component Value Date   TSH 1.33 07/20/2017   Lab Results  Component Value Date   HGBA1C 6.9 04/25/2017   Lab Results  Component Value Date   CHOL 170 07/20/2017   HDL 39 07/20/2017   LDLCALC 110 07/20/2017   TRIG 107 07/20/2017   CHOLHDL 3.9 05/01/2013    Significant Diagnostic Results in last 30 days:  No results found.  Assessment/Plan  1. Cough Dry cough for 2-3 days. Denies reflux symptom. No signs of fluid overload. No dyspnea r chest pain. Add robitussin 10 ml tid for 1 week and monitor. Monitor for fever and worsening cough. He is on ACEI, if cough persists, concern for ACEI related cough.   2. Poor appetite Encouraged hydration. With his cough, poor appetite and fatigue, concern for possible viral infection vs URI. Monitor clinically. If worsens, reassess and obtain labs  3. Fatigue, unspecified type Has history of hypothyroidism but TSH in 5/19 stable. Continue levothyroxine. BP  reading  stable. Continue antihypertensive. Supportive care for now   Family/ staff Communication: reviewed care plan with patient and charge nurse.    Labs/tests ordered:  none  Blanchie Serve, MD Internal Medicine [facility] [address] [place], [place] [address] Cell Phone (Monday-Friday 8 am - 5 pm): [phone] On Call: [phone] and follow prompts after 5 pm and on weekends Office Phone: [phone] Office Fax: [phone]

## 2017-10-18 ENCOUNTER — Encounter: Payer: Self-pay | Admitting: Internal Medicine

## 2017-10-20 ENCOUNTER — Encounter: Payer: Self-pay | Admitting: Internal Medicine

## 2017-10-20 ENCOUNTER — Non-Acute Institutional Stay: Payer: Medicare Other | Admitting: Internal Medicine

## 2017-10-20 DIAGNOSIS — Z8673 Personal history of transient ischemic attack (TIA), and cerebral infarction without residual deficits: Secondary | ICD-10-CM

## 2017-10-20 DIAGNOSIS — E039 Hypothyroidism, unspecified: Secondary | ICD-10-CM

## 2017-10-20 DIAGNOSIS — M1A00X0 Idiopathic chronic gout, unspecified site, without tophus (tophi): Secondary | ICD-10-CM | POA: Diagnosis not present

## 2017-10-20 DIAGNOSIS — N183 Chronic kidney disease, stage 3 unspecified: Secondary | ICD-10-CM

## 2017-10-20 DIAGNOSIS — K5900 Constipation, unspecified: Secondary | ICD-10-CM

## 2017-10-20 DIAGNOSIS — I5022 Chronic systolic (congestive) heart failure: Secondary | ICD-10-CM

## 2017-10-20 NOTE — Progress Notes (Signed)
Location:  [facility] Room Number: [place] of Service:  [id number]) Provider:  Blanchie Serve MD  Blanchie Serve, MD  Patient Care Team: Blanchie Serve, MD as PCP - General (Internal Medicine) [place], Nadean Corwin, MD as Consulting Physician (Cardiology) Ardis Hughs, MD as Attending Physician (Urology) Mast, Man X, NP as Nurse Practitioner (Internal Medicine)  Extended Emergency Contact Information Primary Emergency Contact: [name] of [place] Phone: [phone] Relation: Daughter Secondary Emergency Contact: Eyvonne Mechanic [place] of [place] Phone: [phone] Mobile Phone: [phone] Relation: Daughter  Code Status: DNR  Goals of care: Advanced Directive information Advanced Directives 10/20/2017  Does Patient Have a Medical Advance Directive? Yes  Type of Paramedic of [place];Living will;Out of facility DNR (pink MOST or yellow form)  Does patient want to make changes to medical advance directive? No - Patient declined  Copy of [place] in Chart? Yes  Pre-existing out of facility DNR order (yellow form or pink MOST form) Yellow form placed in chart (order not valid for inpatient use)     Chief Complaint  Patient presents with  . Medical Management of Chronic Issues    Routine Visit     HPI:  Patient is a 84 y.o. male seen today for medical management of chronic diseases. He is in his recliner and denies any concern.  Gout- no recent flare up. On allopurinol 50 mg daily  Chronic systolic CHF- Takes lasix 60 mg daily and additional 20 mg daily as needed for weight gain. Also on losartan 25 mg daily and metoprolol tartrate 25 mg bid. Weight is stable. BP reading stable. Follows with cardiology   Hypothyroidism- currently on levothyroxine 125 mcg daily  Constipation- has a bowel movement every 1-2 days. Denies worsening. Takes  miralax 17 g daily as needed.   ckd stage 3- good urine output, denies urinary complaints besides frequency with history of BPH.    Past Medical History:  Diagnosis Date  . Abnormal PFTs 10/30/2012   Followed in Pulmonary clinic/ [facility]/ [place]  - PFT's 10/22/12 VC  55% no obst, DLCO 50%  - PFT's 12/20/2012 VC 83% and no obst, dLCO 55%  -11/08/2012  Walked RA x 3 laps @ 185 ft each stopped due to  End of study, not desat -11/08/12 esr 10    . Abnormality of gait 04/18/2013  . Acute on chronic systolic congestive heart failure, NYHA class 2 ([place]) 03/16/2006   BNP 376.9 05/28/13   . Adjustment disorder with mixed anxiety and depressed mood 04/18/2013   Post CVA depression and anxiety   . Allergy   . Anxiety   . Aortic aneurysm of unspecified site without mention of rupture 10/27/2011  . Arthritis   . Atherosclerosis of renal artery ([place]) 10/18/2006  . Atrial fibrillation ([place]) 03/04/2011    He was on coumadin until his stroke last December and was switched to apixaban 2.37m daily which he has been taking faithfully without reported side effects.    . Balance disorder 07/31/2014  . Basal cell carcinoma of skin of other and unspecified parts of face 12/24/2009  . Blood transfusion without reported diagnosis   . Cardiac pacemaker in situ- MDT 10/11 03/18/2010   Medtronic revo implant in October 2011. Severe sinus bradycardia in the 30s, but not truly pacemaker dependent   . Cataract   . CKD stage 2 due to type 2 diabetes mellitus ([place] 07/03/2014  .  Coronary artery disease   . CVA - Rt brain stroke 12/14 02/18/2013   02/19/13 angiography CT head:  Diffuse atherosclerotic irregularity and plaque formation of the distal right common carotid artery and proximal internal carotid artery without significant stenosis. Plaque ulceration is present and is a source of emboli. A small right thalamic CVA    . DM (diabetes mellitus), type 2 with renal complications ([place]) 0/11/3816  . Dyslipidemia 11/03/2004    . Fall 03/04/2011  . Fatigue 04/18/2013  . Gout, unspecified 11/03/2004  . Heart murmur   . History of abdominal aortic aneurysm   . History of Doppler ultrasound 05/22/2012   LEAs; R anterior tibial artery appeared occluded; L posterior tibial shows short segment of occlusive ds  . History of Doppler ultrasound 05/22/2012   Abdominal Aortic Doppler; slight increase in fusiform aneurysm   . History of echocardiogram 12/2008   EF >55%; mild concentric LVH; mild MR; mild-mod TR; mild AV regurg;   . History of nuclear stress test 12/2010   lexiscan; low risk; compared to prior study, perfusion improved  . HTN (hypertension) 03/04/2011  . Hyperlipidemia   . Hypertension   . Hypertrophy of prostate with urinary obstruction and other lower urinary tract symptoms (LUTS) 04/28/2004  . Hyponatremia 03/04/2011  . Hypothyroidism 03/04/2011  . Impotence of organic origin 10/03/2000  . Internal hemorrhoids without mention of complication 29/93/7169  . Long term (current) use of anticoagulants 03/17/2011  . Major depressive disorder, single episode, unspecified 03/05/2009  . Memory change 01/24/2013  . Muscle weakness (generalized) 03/10/2011  . Nocturia 10/27/2011  . Obstructive sleep apnea (adult) (pediatric) 07/23/2008  . Open wound of knee, leg (except thigh), and ankle, complicated 6/78/9381  . Osteoarthritis of both knees 07/31/2014  . Osteoarthrosis, unspecified whether generalized or localized, unspecified site 02/05/2003  . PAF (paroxysmal atrial fibrillation) (HCC)    coumadin  . Pain in joint, lower leg 08/04/2011  . Pruritic condition 01/24/2013  . Quadriceps weakness 07/31/2014  . Right bundle branch block 04/19/2006  . 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 Spinal stenosis in cervical region 12/15/2004  . SSS (sick sinus syndrome) ([place]) 04/08/2014  . Stroke ([place])   . Thyroid disease   . TIA (transient ischemic attack) 04/30/2013  . Type II  or unspecified type diabetes mellitus without mention of complication, not stated as uncontrolled 11/24/2004  . Unspecified vitamin D deficiency 10/18/2006  . 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 Increased exposure of the right cornea.    Past Surgical History:  Procedure Laterality Date  . CORONARY ANGIOPLASTY WITH STENT PLACEMENT  2008   stent to SVG to OM  . CORONARY ARTERY BYPASS GRAFT  1989  . EYE SURGERY    . HERNIA REPAIR    . INSERT / REPLACE / REMOVE PACEMAKER    . JOINT REPLACEMENT    . PACEMAKER INSERTION  2011    Allergies  Allergen Reactions  . Quinolones Other (See Comments)    Risk of rupture of AAA  . Caduet [Amlodipine-Atorvastatin] Other (See Comments)    Weakness   . Crestor [Rosuvastatin] Other (See Comments)    Muscle pain/weakness  . Lipitor [Atorvastatin Calcium] Other (See Comments)    Muscle pain/weakness  . Simvastatin Other (See Comments)    Muscle pain/weakness  . Norvasc [Amlodipine]   . Lisinopril Other (See Comments)    Doesn't remember reaction    Outpatient Encounter Medications  as of 10/20/2017  Medication Sig  . allopurinol (ZYLOPRIM) 100 MG tablet Take 50 mg by mouth daily.   Marland Kitchen ALPRAZolam (XANAX) 0.25 MG tablet Take 0.25 mg by mouth every 6 (six) hours as needed for anxiety.  Marland Kitchen amoxicillin (AMOXIL) 500 MG capsule Take 500 mg by mouth. Take 4 caps prior to dental procedures  . apixaban (ELIQUIS) 2.5 MG TABS tablet Take 1 tablet (2.5 mg total) by mouth 2 (two) times daily.  . Cholecalciferol 1000 units tablet Take 2,000 Units by mouth daily.  Marland Kitchen Dextran 70-Hypromellose 0.1-0.3 % SOLN Apply 1 drop to eye daily.   . furosemide (LASIX) 20 MG tablet Take 60 mg by mouth daily.   . furosemide (LASIX) 20 MG tablet Take 20 mg by mouth as needed (weight gain > 3 lbs in 24 hours or >5 lbs in a week).  Marland Kitchen ipratropium (ATROVENT) 0.03 % nasal spray Place 1 spray into both nostrils 3 (three) times daily as needed for rhinitis.  Marland Kitchen  ipratropium-albuterol (DUONEB) 0.5-2.5 (3) MG/3ML SOLN Take 3 mLs by nebulization 4 (four) times daily as needed.  Marland Kitchen levothyroxine (SYNTHROID, LEVOTHROID) 125 MCG tablet Take 125 mcg by mouth daily before breakfast.  . losartan (COZAAR) 25 MG tablet Take 1 tablet (25 mg total) by mouth daily.  . meclizine (ANTIVERT) 25 MG tablet Take 25 mg by mouth every 6 (six) hours as needed.   . Menthol, Topical Analgesic, (BIOFREEZE) 4 % GEL Apply topically. Apply to left knee four times daily as needed for pain  . metoprolol tartrate (LOPRESSOR) 25 MG tablet Take 1 tablet (25 mg total) by mouth 2 (two) times daily.  Marland Kitchen Neomycin-Bacitracin-Polymyxin (TRIPLE ANTIBIOTIC) [phone] OINT Apply 1 application topically daily as needed (apply to bilateral nostrils).  . nitroGLYCERIN (NITROSTAT) 0.4 MG SL tablet Place 0.4 mg under the tongue every 5 (five) minutes as needed for chest pain.   . polyethylene glycol (MIRALAX / GLYCOLAX) packet Take 17 g by mouth daily as needed.   . triamcinolone cream (KENALOG) 0.1 % Apply 1 application topically daily.  . [DISCONTINUED] dextromethorphan-guaiFENesin (ROBAFEN DM COUGH) 10-100 MG/5ML liquid Take 10 mLs by mouth every 6 (six) hours as needed for cough.  . [DISCONTINUED] UNABLE TO FIND Med Name: Hydrocortisone cream with applicator 1%. Apply to hemorrhoids 2 times daily as needed   No facility-administered encounter medications on file as of 10/20/2017.     Review of Systems  Constitutional: Negative for appetite change, chills, diaphoresis and fever.  HENT: Positive for hearing loss and rhinorrhea. Negative for congestion, ear discharge, ear pain, mouth sores, sinus pressure, sinus pain, sore throat and trouble swallowing.   Eyes: Positive for visual disturbance. Negative for pain and itching.  Respiratory: Positive for cough. Negative for choking and shortness of breath.        Dry cough  Cardiovascular: Negative for chest pain, palpitations and leg swelling.    Gastrointestinal: Negative for abdominal pain, constipation, diarrhea, nausea and vomiting.  Genitourinary: Negative for dysuria and hematuria.  Musculoskeletal: Positive for arthralgias and gait problem. Negative for back pain.       No fall reported  Skin: Negative for rash.  Neurological: Negative for dizziness, numbness and headaches.    Immunization History  Administered Date(s) Administered  . Influenza Whole 12/06/2011, 12/19/2012  . Influenza-Unspecified 01/09/2014, 11/25/2014, 12/23/2015, 12/26/2016  . PPD Test 05/03/2013  . Pneumococcal Conjugate-13 01/03/2017  . Pneumococcal Polysaccharide-23 03/07/1986  . Td 04/20/2005  . Tdap 01/03/2017   Pertinent  Health Maintenance Due  Topic Date Due  . OPHTHALMOLOGY EXAM  01/12/1932  . FOOT EXAM  07/31/2015  . INFLUENZA VACCINE  10/05/2017  . HEMOGLOBIN A1C  10/23/2017  . PNA vac Low Risk Adult (2 of 2 - PPSV23) 01/03/2018   Fall Risk  11/15/2016 10/15/2015 06/11/2015 10/23/2014 07/07/2014  Falls in the past year? No No Yes Yes Yes  Number falls in past yr: - - _0 Injury with Fall? - - Yes - No  Risk for fall due to : - - - - History of fall(s);Impaired mobility;Impaired balance/gait  Follow up - - - - Falls evaluation completed;Falls prevention discussed   Functional Status Survey:    Vitals:   10/20/17 1104  BP: 122/70  Pulse: 63  Resp: 18  Temp: 98.9 F (37.2 C)  TempSrc: Oral  SpO2: 96%  Weight: 159 lb 12.8 oz (72.5 kg)  Height: _1  (1.727 m)   Body mass index is 24.3 kg/m.   Wt Readings from Last 3 Encounters:  10/20/17 159 lb 12.8 oz (72.5 kg)  10/04/17 159 lb 12.8 oz (72.5 kg)  07/19/17 158 lb 3.2 oz (71.8 kg)   Physical Exam  Constitutional: He is oriented to person, place, and time. He appears well-developed and well-nourished. No distress.  HENT:  Head: Normocephalic and atraumatic.  Nose: Nose normal.  Mouth/Throat: Oropharynx is clear and moist. No oropharyngeal exudate.  Eyes: Conjunctivae  are normal. Right eye exhibits no discharge. Left eye exhibits no discharge.  Neck: Normal range of motion. Neck supple.  Cardiovascular: Normal rate and regular rhythm.  Murmur heard. Pulmonary/Chest: Effort normal. No respiratory distress. He has no wheezes. He has rales.  Abdominal: Soft. Bowel sounds are normal. There is no tenderness. There is no guarding.  Musculoskeletal: Normal range of motion. He exhibits no edema.  Unsteady gait, uses motorized wheelchair and rolling walker   Lymphadenopathy:    He has no cervical adenopathy.  Neurological: He is alert and oriented to person, place, and time.  Skin: Skin is warm and dry. He is not diaphoretic.  Psychiatric: He has a normal mood and affect.    Labs reviewed: Recent Labs    03/12/17 03/16/17 07/20/17  NA 132* 133* 138  K 4.0 3.8 4.1  CL  --   --  102  CO2  --   --  29  BUN 29* 31* 28*  CREATININE 1.1 1.2 1.2  CALCIUM  --   --  9.3   Recent Labs    03/12/17 07/20/17  AST 15 16  ALT 10 11  ALKPHOS 89 84  ALBUMIN  --  3.9   Recent Labs    03/12/17 03/16/17 07/20/17  WBC 11.0 10.5 6.2  HGB 14.7 14.1 15.3  HCT 43 40* 45  PLT 225 199 212   Lab Results  Component Value Date   TSH 1.33 07/20/2017   Lab Results  Component Value Date   HGBA1C 6.9 04/25/2017   Lab Results  Component Value Date   CHOL 170 07/20/2017   HDL 39 07/20/2017   LDLCALC 110 07/20/2017   TRIG 107 07/20/2017   CHOLHDL 3.9 05/01/2013    Significant Diagnostic Results in last 30 days:  No results found.  Assessment/Plan  1. Chronic systolic congestive heart failure (HCC) Appears euvolemic. Continue lasix, metoprolol and losartan. BMP reviewed. Check BMP. BP stable, monitor  2. History of CVA (cerebrovascular accident) Continue apixaban for stroke prevention with history of afib and CVA. Supportive care. Continue antihypertensives.  3. Idiopathic chronic gout without tophus, unspecified site Check uric acid level, continue  allopurinol  4. Acquired hypothyroidism Lab Results  Component Value Date   TSH 1.33 07/20/2017   Continue levothyroxine, check tsh  5. Constipation, unspecified constipation type Continue miralax as needed and encouraged to maintain hydration  6. CKD (chronic kidney disease) stage 3, GFR 30-59 ml/min (HCC) Monitor rena function    Family/ staff Communication: reviewed care plan with patient and charge nurse.    Labs/tests ordered:  BMP, TSH, uric acid 10/24/17   Blanchie Serve, MD Internal Medicine [facility] [address] [place], [place] [address] Cell Phone (Monday-Friday 8 am - 5 pm): [phone] On Call: [phone] and follow prompts after 5 pm and on weekends Office Phone: [phone] Office Fax: [phone]

## 2017-10-24 DIAGNOSIS — I5022 Chronic systolic (congestive) heart failure: Secondary | ICD-10-CM | POA: Diagnosis not present

## 2017-10-24 DIAGNOSIS — N183 Chronic kidney disease, stage 3 (moderate): Secondary | ICD-10-CM | POA: Diagnosis not present

## 2017-10-24 DIAGNOSIS — E039 Hypothyroidism, unspecified: Secondary | ICD-10-CM | POA: Diagnosis not present

## 2017-10-24 LAB — BASIC METABOLIC PANEL
BUN: 28 — AB (ref 4–21)
CREATININE: 1.1 (ref <=1.3)
Glucose: 127
POTASSIUM: 4.6 (ref 3.4–5.3)
SODIUM: 137 (ref 137–147)

## 2017-10-24 LAB — TSH: TSH: 1.22 (ref <=5.90)

## 2017-10-25 ENCOUNTER — Other Ambulatory Visit: Payer: Self-pay | Admitting: *Deleted

## 2017-10-25 LAB — BASIC METABOLIC PANEL
Calcium: 9.3
Carbon Dioxide, Total: 28
Chloride: 100
GFR CALC NON AF AMER: 57
Uric Acid: 7

## 2017-11-01 ENCOUNTER — Ambulatory Visit (INDEPENDENT_AMBULATORY_CARE_PROVIDER_SITE_OTHER): Payer: Medicare Other | Admitting: Cardiovascular Disease

## 2017-11-01 ENCOUNTER — Encounter: Payer: Self-pay | Admitting: Cardiovascular Disease

## 2017-11-01 VITALS — BP 110/60 | HR 62 | Ht 68.0 in | Wt 157.8 lb

## 2017-11-01 DIAGNOSIS — I714 Abdominal aortic aneurysm, without rupture, unspecified: Secondary | ICD-10-CM

## 2017-11-01 DIAGNOSIS — Z7901 Long term (current) use of anticoagulants: Secondary | ICD-10-CM

## 2017-11-01 DIAGNOSIS — Z950 Presence of cardiac pacemaker: Secondary | ICD-10-CM

## 2017-11-01 DIAGNOSIS — I4821 Permanent atrial fibrillation: Secondary | ICD-10-CM

## 2017-11-01 DIAGNOSIS — I25810 Atherosclerosis of coronary artery bypass graft(s) without angina pectoris: Secondary | ICD-10-CM | POA: Diagnosis not present

## 2017-11-01 DIAGNOSIS — I482 Chronic atrial fibrillation: Secondary | ICD-10-CM

## 2017-11-01 DIAGNOSIS — I5032 Chronic diastolic (congestive) heart failure: Secondary | ICD-10-CM | POA: Diagnosis not present

## 2017-11-01 NOTE — Patient Instructions (Signed)
Dr Sallyanne Kuster recommends that you continue on your current medications as directed. Please refer to the Current Medication list given to you today.  Remote monitoring is used to monitor your Pacemaker or ICD from home. This monitoring reduces the number of office visits required to check your device to one time per year. It allows [name] to keep an eye on the functioning of your device to ensure it is working properly. You are scheduled for a device check from home on Wednesday, October 16th, 2019. You may send your transmission at any time that day. If you have a wireless device, the transmission will be sent automatically. After your physician reviews your transmission, you will receive a notification with your next transmission date.  To improve our patient care and to more adequately follow your device, [facility] has decided, as a practice, to start following each patient four times a year with your home monitor. This means that you may experience a remote appointment that is close to an in-office appointment with your physician. Your insurance will apply at the same rate as other remote monitoring transmissions.  Dr Sallyanne Kuster recommends that you schedule a follow-up appointment in 12 months with a pacemaker check. You will receive a reminder letter in the mail two months in advance. If you don't receive a letter, please call our office to schedule the follow-up appointment.  If you need a refill on your cardiac medications before your next appointment, please call your pharmacy.

## 2017-11-01 NOTE — Progress Notes (Signed)
Patient ID: Jon [name], male   DOB: Dec 11, 1921, 84 y.o.   MRN: [id number]     Cardiology Office Note    Date:  11/01/2017   ID:  Jon [name], DOB 04/05/1921, MRN [id number]  PCP:  Jon Serve, MD  Cardiologist:  Pixie Casino, MD; Sanda Klein, MD   chief complaint: fatigue   History of Present Illness:  Jon [name] is a 84 y.o. male with congestive heart failure, permanent atrial fibrillation, history of previous stroke on chronic anticoagulation, CAD s/p CABG, COPD here for follow-up on his permanent pacemaker (initially implanted for sinus node dysfunction with symptomatic bradycardia, now programmed VVIR).  He last saw Dr. Debara Pickett in September 2018.  He is doing well and has not had any cardiac problems or complaints.  He denies any falls or bleeding problems.  His electric scooter is in the repair shop so he is using a walker.  Activity remains very low (measured at 0.3 hours/day by his pacemaker). The patient specifically denies any chest pain at rest exertion, dyspnea at rest or with exertion, orthopnea, paroxysmal nocturnal dyspnea, syncope, palpitations, focal neurological deficits, intermittent claudication, lower extremity edema, unexplained weight gain, cough, hemoptysis or wheezing.  He continues to thicken his fluids to avoid aspiration.  Still seeing a speech pathologist.  He has found companionship with a 1 year old lady resident in his facility.  Comprehensive interrogation of his device in the office today shows normal function. He has an MRI conditional Medtronic Revo device implanted in 2011. Generator longevity is not automatically reported by his device but I expect another 6 months (voltage 2.83 V, RRT 2.81 V).  Just as at his last device check, ventricular lead parameters are excellent. He has roughly 48% ventricular pacing and 52% ventricular sensed rhythm with a histogram that is appropriate for his level of activity. He is in permanent atrial  fibrillation and his dual-chamber device is now programmed VVIR.  Past Medical History:  Diagnosis Date  . Abnormal PFTs 10/30/2012   Followed in Pulmonary clinic/ [facility]/ [place]  - PFT's 10/22/12 VC  55% no obst, DLCO 50%  - PFT's 12/20/2012 VC 83% and no obst, dLCO 55%  -11/08/2012  Walked RA x 3 laps @ 185 ft each stopped due to  End of study, not desat -11/08/12 esr 10    . Abnormality of gait 04/18/2013  . Acute on chronic systolic congestive heart failure, NYHA class 2 ([place]) 03/16/2006   BNP 376.9 05/28/13   . Adjustment disorder with mixed anxiety and depressed mood 04/18/2013   Post CVA depression and anxiety   . Allergy   . Anxiety   . Aortic aneurysm of unspecified site without mention of rupture 10/27/2011  . Arthritis   . Atherosclerosis of renal artery ([place]) 10/18/2006  . Atrial fibrillation ([place]) 03/04/2011    He was on coumadin until his stroke last December and was switched to apixaban 2.'5mg'$  daily which he has been taking faithfully without reported side effects.    . Balance disorder 07/31/2014  . Basal cell carcinoma of skin of other and unspecified parts of face 12/24/2009  . Blood transfusion without reported diagnosis   . Cardiac pacemaker in situ- MDT 10/11 03/18/2010   Medtronic revo implant in October 2011. Severe sinus bradycardia in the 30s, but not truly pacemaker dependent   . Cataract   . CKD stage 2 due to type 2 diabetes mellitus ([place]) 07/03/2014  . Coronary artery disease   . CVA - Rt  brain stroke 12/14 02/18/2013   02/19/13 angiography CT head:  Diffuse atherosclerotic irregularity and plaque formation of the distal right common carotid artery and proximal internal carotid artery without significant stenosis. Plaque ulceration is present and is a source of emboli. A small right thalamic CVA    . DM (diabetes mellitus), type 2 with renal complications ([place]) 8/46/9629  . Dyslipidemia 11/03/2004  . Fall 03/04/2011  . Fatigue 04/18/2013  . Gout, unspecified  11/03/2004  . Heart murmur   . History of abdominal aortic aneurysm   . History of Doppler ultrasound 05/22/2012   LEAs; R anterior tibial artery appeared occluded; L posterior tibial shows short segment of occlusive ds  . History of Doppler ultrasound 05/22/2012   Abdominal Aortic Doppler; slight increase in fusiform aneurysm   . History of echocardiogram 12/2008   EF >55%; mild concentric LVH; mild MR; mild-mod TR; mild AV regurg;   . History of nuclear stress test 12/2010   lexiscan; low risk; compared to prior study, perfusion improved  . HTN (hypertension) 03/04/2011  . Hyperlipidemia   . Hypertension   . Hypertrophy of prostate with urinary obstruction and other lower urinary tract symptoms (LUTS) 04/28/2004  . Hyponatremia 03/04/2011  . Hypothyroidism 03/04/2011  . Impotence of organic origin 10/03/2000  . Internal hemorrhoids without mention of complication 52/84/1324  . Long term (current) use of anticoagulants 03/17/2011  . Major depressive disorder, single episode, unspecified 03/05/2009  . Memory change 01/24/2013  . Muscle weakness (generalized) 03/10/2011  . Nocturia 10/27/2011  . Obstructive sleep apnea (adult) (pediatric) 07/23/2008  . Open wound of knee, leg (except thigh), and ankle, complicated 06/06/270  . Osteoarthritis of both knees 07/31/2014  . Osteoarthrosis, unspecified whether generalized or localized, unspecified site 02/05/2003  . PAF (paroxysmal atrial fibrillation) (HCC)    coumadin  . Pain in joint, lower leg 08/04/2011  . Pruritic condition 01/24/2013  . Quadriceps weakness 07/31/2014  . Right bundle branch block 04/19/2006  . 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 Spinal stenosis in cervical region 12/15/2004  . SSS (sick sinus syndrome) ([place]) 04/08/2014  . Stroke ([place])   . Thyroid disease   . TIA (transient ischemic attack) 04/30/2013  . Type II or unspecified type diabetes mellitus without mention of  complication, not stated as uncontrolled 11/24/2004  . Unspecified vitamin D deficiency 10/18/2006  . 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 Increased exposure of the right cornea.     Past Surgical History:  Procedure Laterality Date  . CORONARY ANGIOPLASTY WITH STENT PLACEMENT  2008   stent to SVG to OM  . CORONARY ARTERY BYPASS GRAFT  1989  . EYE SURGERY    . HERNIA REPAIR    . INSERT / REPLACE / REMOVE PACEMAKER    . JOINT REPLACEMENT    . PACEMAKER INSERTION  2011    Current Medications: Outpatient Medications Prior to Visit  Medication Sig Dispense Refill  . Carboxymethylcellulose Sodium (THERATEARS OP) Place 1 drop into both eyes at bedtime as needed.    Marland Kitchen ipratropium (ATROVENT) 0.06 % nasal spray Place 1 spray into both nostrils 3 (three) times daily as needed for rhinitis.    Sunday Corn Mineral Oil-Mineral Oil (RETAINE MGD OP) Place 1 drop into both eyes at bedtime as needed.    Marland Kitchen allopurinol (ZYLOPRIM) 100 MG tablet Take 50 mg by mouth daily.     Marland Kitchen ALPRAZolam (XANAX) 0.25 MG tablet  Take 0.25 mg by mouth every 6 (six) hours as needed for anxiety.    Marland Kitchen amoxicillin (AMOXIL) 500 MG capsule Take 500 mg by mouth. Take 4 caps prior to dental procedures    . apixaban (ELIQUIS) 2.5 MG TABS tablet Take 1 tablet (2.5 mg total) by mouth 2 (two) times daily. 60 tablet 11  . Cholecalciferol 1000 units tablet Take 2,000 Units by mouth daily.    . furosemide (LASIX) 20 MG tablet Take 60 mg by mouth daily.     . furosemide (LASIX) 20 MG tablet Take 20 mg by mouth as needed (weight gain > 3 lbs in 24 hours or >5 lbs in a week).    Marland Kitchen ipratropium-albuterol (DUONEB) 0.5-2.5 (3) MG/3ML SOLN Take 3 mLs by nebulization 4 (four) times daily as needed.    Marland Kitchen levothyroxine (SYNTHROID, LEVOTHROID) 125 MCG tablet Take 125 mcg by mouth daily before breakfast.    . losartan (COZAAR) 25 MG tablet Take 1 tablet (25 mg total) by mouth daily. 90 tablet 3  . meclizine (ANTIVERT) 25 MG tablet  Take 25 mg by mouth every 6 (six) hours as needed.     . Menthol, Topical Analgesic, (BIOFREEZE) 4 % GEL Apply topically. Apply to left knee four times daily as needed for pain    . metoprolol tartrate (LOPRESSOR) 25 MG tablet Take 1 tablet (25 mg total) by mouth 2 (two) times daily. 180 tablet 3  . Neomycin-Bacitracin-Polymyxin (TRIPLE ANTIBIOTIC) [phone] OINT Apply 1 application topically daily as needed (apply to bilateral nostrils).    . nitroGLYCERIN (NITROSTAT) 0.4 MG SL tablet Place 0.4 mg under the tongue every 5 (five) minutes as needed for chest pain.     . polyethylene glycol (MIRALAX / GLYCOLAX) packet Take 17 g by mouth daily as needed.     . triamcinolone cream (KENALOG) 0.1 % Apply 1 application topically daily.    Marland Kitchen Dextran 70-Hypromellose 0.1-0.3 % SOLN Apply 1 drop to eye daily.     Marland Kitchen ipratropium (ATROVENT) 0.03 % nasal spray Place 1 spray into both nostrils 3 (three) times daily as needed for rhinitis.     No facility-administered medications prior to visit.      Allergies:   Quinolones; Caduet [amlodipine-atorvastatin]; Crestor [rosuvastatin]; Lipitor [atorvastatin calcium]; Simvastatin; Norvasc [amlodipine]; and Lisinopril   Social History   Socioeconomic History  . Marital status: Married    Spouse name: Not on file  . Number of children: 3  . Years of education: BA  . Highest education level: Not on file  Occupational History  . Occupation: Retired Land  . Financial resource strain: Not on file  . Food insecurity:    Worry: Not on file    Inability: Not on file  . Transportation needs:    Medical: Not on file    Non-medical: Not on file  Tobacco Use  . Smoking status: Former Smoker    Packs/day: 0.75    Years: 15.00    Pack years: 11.25    Types: Cigarettes    Last attempt to quit: 03/04/1967    Years since quitting: 50.6  . Smokeless tobacco: Never Used  Substance and Sexual Activity  . Alcohol use: No    Alcohol/week:  0.0 standard drinks  . Drug use: No  . Sexual activity: Never  Lifestyle  . Physical activity:    Days per week: Not on file    Minutes per session: Not on file  . Stress: Not on file  Relationships  . Social connections:    Talks on phone: Not on file    Gets together: Not on file    Attends religious service: Not on file    Active member of club or organization: Not on file    Attends meetings of clubs or organizations: Not on file    Relationship status: Not on file  Other Topics Concern  . Not on file  Social History Narrative   Lives at [facility] since 03/2009, moved to [place] 04/2013   Widowed    Stopped smoking 1968   Alcohol none   Exercise  none   Walks with walker   Living Will, POA           Family History:  The patient's family history includes Diabetes in his father; Other in his mother.   ROS:   Please see the history of present illness.    ROS All other systems reviewed and are negative.   PHYSICAL EXAM:   VS:  BP 110/60   Pulse 62   Ht '5\' 8"'$  (1.727 m)   Wt 157 lb 12.8 oz (71.6 kg)   BMI 23.99 kg/m      General: Alert, oriented x3, no distress, he appears elderly and frail, but is smiling and looks quite comfortable Head: no evidence of trauma, PERRL, EOMI, no exophtalmos or lid lag, no myxedema, no xanthelasma; normal ears, nose and oropharynx Neck: normal jugular venous pulsations and no hepatojugular reflux; brisk carotid pulses without delay and no carotid bruits Chest: clear to auscultation, no signs of consolidation by percussion or palpation, normal fremitus, symmetrical and full respiratory excursions, healthy subclavian pacemaker site Cardiovascular: normal position and quality of the apical impulse, regular rhythm, normal first and second heart sounds, no murmurs, rubs or gallops Abdomen: no tenderness or distention, no masses by palpation, no abnormal pulsatility or arterial bruits, normal bowel sounds, no  hepatosplenomegaly Extremities: no clubbing, cyanosis or edema; 2+ radial, ulnar and brachial pulses bilaterally; 2+ right femoral, posterior tibial and dorsalis pedis pulses; 2+ left femoral, posterior tibial and dorsalis pedis pulses; no subclavian or femoral bruits Neurological: grossly nonfocal Psych: Normal mood and affect    Wt Readings from Last 3 Encounters:  11/01/17 157 lb 12.8 oz (71.6 kg)  10/20/17 159 lb 12.8 oz (72.5 kg)  10/04/17 159 lb 12.8 oz (72.5 kg)      Studies/Labs Reviewed:   EKG:  EKG is ordered today.  Shows atrial fibrillation with alternating ventricular sensed and ventricular paced rhythm, RBBB pattern on native QRS complexes, QTC 456 ms  Recent Labs: 07/20/2017: ALT 11; Hemoglobin 15.3; Platelets 212 10/24/2017: BUN 28; Creatinine 1.1; Potassium 4.6; Sodium 137; TSH 1.22   Lipid Panel    Component Value Date/Time   CHOL 170 07/20/2017   TRIG 107 07/20/2017   HDL 39 07/20/2017   CHOLHDL 3.9 05/01/2013 0528   VLDL 16 05/01/2013 0528   LDLCALC 110 07/20/2017    ASSESSMENT:    1. Chronic diastolic heart failure ([place])   2. Coronary artery disease involving coronary bypass graft of native heart without angina pectoris   3. Permanent atrial fibrillation ([place])   4. Long term current use of anticoagulant   5. Pacemaker   6. AAA (abdominal aortic aneurysm) without rupture (HCC)     PLAN:  In order of problems listed above:  1. CHF: Euvolemic by clinical exam, NYHA probably functional class I-2, very sedentary.  He is on a moderate dose of daily diuretic  and has not required recent adjustments. Preserved LV EF. No recent problems of dizziness or hypotension.  He remains on low doses of ARB and beta-blocker in addition to his diuretic 2. CAD: Well over 10 years have passed since his last catheterization procedure without recurrence of angina pectoris.  He is not on statins but has been intolerant of multiple agents including simvastatin, atorvastatin,  rosuvastatin; will defer to Dr. Debara Pickett whether or not it is worth pursuing PCSK9 inhibitors in this 84 year old gentleman 3. AFib: Fair balance between excessive pacing and prevention of high ventricular rates on current medication dose. CHADSVasc 8 (age 71, CVA 2, HTN, DM, CHF, CAD and AAA). 4. Anticoagulation: Eliquis dose adjusted for age and body size.  No serious bleeding complications. 5. PPM: We have had some difficulty getting his downloads from [facility].  If we do not get a remote download in 3 months we will have to bring him into the device clinic for in person check, especially since he is approaching ERI.  He is not device dependent. 6. AAA: Asymptomatic and stable in diameter at 4.2 cm by last scan performed in April 2019.    Medication Adjustments/Labs and Tests Ordered: Current medicines are reviewed at length with the patient today.  Concerns regarding medicines are outlined above.  Medication changes, Labs and Tests ordered today are listed in the Patient Instructions below. Patient Instructions  Dr Sallyanne Kuster recommends that you continue on your current medications as directed. Please refer to the Current Medication list given to you today.  Remote monitoring is used to monitor your Pacemaker or ICD from home. This monitoring reduces the number of office visits required to check your device to one time per year. It allows [name] to keep an eye on the functioning of your device to ensure it is working properly. You are scheduled for a device check from home on Wednesday, October 16th, 2019. You may send your transmission at any time that day. If you have a wireless device, the transmission will be sent automatically. After your physician reviews your transmission, you will receive a notification with your next transmission date.  To improve our patient care and to more adequately follow your device, [facility] has decided, as a practice, to start following each patient four times a  year with your home monitor. This means that you may experience a remote appointment that is close to an in-office appointment with your physician. Your insurance will apply at the same rate as other remote monitoring transmissions.  Dr Sallyanne Kuster recommends that you schedule a follow-up appointment in 12 months with a pacemaker check. You will receive a reminder letter in the mail two months in advance. If you don't receive a letter, please call our office to schedule the follow-up appointment.  If you need a refill on your cardiac medications before your next appointment, please call your pharmacy.     Signed, Sanda Klein, MD  11/01/2017 4:00 PM    [address], [place], [place]  [address] Phone: [phone]; Fax: [phone]

## 2017-11-09 ENCOUNTER — Non-Acute Institutional Stay: Payer: Medicare Other | Admitting: Nurse Practitioner

## 2017-11-09 ENCOUNTER — Encounter: Payer: Self-pay | Admitting: Nurse Practitioner

## 2017-11-09 DIAGNOSIS — I482 Chronic atrial fibrillation: Secondary | ICD-10-CM

## 2017-11-09 DIAGNOSIS — L989 Disorder of the skin and subcutaneous tissue, unspecified: Secondary | ICD-10-CM | POA: Diagnosis not present

## 2017-11-09 DIAGNOSIS — I4821 Permanent atrial fibrillation: Secondary | ICD-10-CM

## 2017-11-09 NOTE — Assessment & Plan Note (Signed)
Heart rate is in control, 11/01/17 EKG Afib, continue Eliquis 2.5mg  bid for thromboembolic risk reduction. F/u Cardilology.

## 2017-11-09 NOTE — Progress Notes (Signed)
Location:   [facility] Room Number: 902 Place of Service: [facility] Provider:  Lennie Odor Mast NP  Blanchie Serve, MD  Patient Care Team: Blanchie Serve, MD as PCP - General (Internal Medicine) [place], Nadean Corwin, MD as Consulting Physician (Cardiology) Ardis Hughs, MD as Attending Physician (Urology) Mast, Man X, NP as Nurse Practitioner (Internal Medicine)  Extended Emergency Contact Information Primary Emergency Contact: [name] of [place] Phone: [phone] Relation: Daughter Secondary Emergency Contact: Eyvonne Mechanic [place] of [place] Phone: [phone] Mobile Phone: [phone] Relation: Daughter  Code Status:  DNR Goals of care: Advanced Directive information Advanced Directives 10/20/2017  Does Patient Have a Medical Advance Directive? Yes  Type of Paramedic of [place];Living will;Out of facility DNR (pink MOST or yellow form)  Does patient want to make changes to medical advance directive? No - Patient declined  Copy of [place] in Chart? Yes  Pre-existing out of facility DNR order (yellow form or pink MOST form) Yellow form placed in chart (order not valid for inpatient use)     Chief Complaint  Patient presents with  . Acute Visit    lump on chin /cheek    HPI:  Pt is a 84 y.o. male seen today for evaluation of the skin growth on the right cheek, duration uncertain, denied pain or itching. The patient stated he had dermatology evaluation in the past, no intervention done. He desires topical agent prescribed for irritation today. Hx of Afib, heart rate is in control today, on Eliquis 2.'5mg'$  bid for thromboembolic risk reduction.    Past Medical History:  Diagnosis Date  . Abnormal PFTs 10/30/2012   Followed in Pulmonary clinic/ [facility]/ [place]  - PFT's 10/22/12 VC  55% no obst, DLCO 50%  - PFT's 12/20/2012 VC 83% and no obst, dLCO  55%  -11/08/2012  Walked RA x 3 laps @ 185 ft each stopped due to  End of study, not desat -11/08/12 esr 10    . Abnormality of gait 04/18/2013  . Acute on chronic systolic congestive heart failure, NYHA class 2 ([place]) 03/16/2006   BNP 376.9 05/28/13   . Adjustment disorder with mixed anxiety and depressed mood 04/18/2013   Post CVA depression and anxiety   . Allergy   . Anxiety   . Aortic aneurysm of unspecified site without mention of rupture 10/27/2011  . Arthritis   . Atherosclerosis of renal artery ([place]) 10/18/2006  . Atrial fibrillation ([place]) 03/04/2011    He was on coumadin until his stroke last December and was switched to apixaban 2.'5mg'$  daily which he has been taking faithfully without reported side effects.    . Balance disorder 07/31/2014  . Basal cell carcinoma of skin of other and unspecified parts of face 12/24/2009  . Blood transfusion without reported diagnosis   . Cardiac pacemaker in situ- MDT 10/11 03/18/2010   Medtronic revo implant in October 2011. Severe sinus bradycardia in the 30s, but not truly pacemaker dependent   . Cataract   . CKD stage 2 due to type 2 diabetes mellitus ([place]) 07/03/2014  . Coronary artery disease   . CVA - Rt brain stroke 12/14 02/18/2013   02/19/13 angiography CT head:  Diffuse atherosclerotic irregularity and plaque formation of the distal right common carotid artery and proximal internal carotid artery without significant stenosis. Plaque ulceration is present and is a source of emboli. A small right thalamic CVA    .  DM (diabetes mellitus), type 2 with renal complications ([place]) 7/51/7001  . Dyslipidemia 11/03/2004  . Fall 03/04/2011  . Fatigue 04/18/2013  . Gout, unspecified 11/03/2004  . Heart murmur   . History of abdominal aortic aneurysm   . History of Doppler ultrasound 05/22/2012   LEAs; R anterior tibial artery appeared occluded; L posterior tibial shows short segment of occlusive ds  . History of Doppler ultrasound 05/22/2012   Abdominal Aortic  Doppler; slight increase in fusiform aneurysm   . History of echocardiogram 12/2008   EF >55%; mild concentric LVH; mild MR; mild-mod TR; mild AV regurg;   . History of nuclear stress test 12/2010   lexiscan; low risk; compared to prior study, perfusion improved  . HTN (hypertension) 03/04/2011  . Hyperlipidemia   . Hypertension   . Hypertrophy of prostate with urinary obstruction and other lower urinary tract symptoms (LUTS) 04/28/2004  . Hyponatremia 03/04/2011  . Hypothyroidism 03/04/2011  . Impotence of organic origin 10/03/2000  . Internal hemorrhoids without mention of complication 74/94/4967  . Long term (current) use of anticoagulants 03/17/2011  . Major depressive disorder, single episode, unspecified 03/05/2009  . Memory change 01/24/2013  . Muscle weakness (generalized) 03/10/2011  . Nocturia 10/27/2011  . Obstructive sleep apnea (adult) (pediatric) 07/23/2008  . Open wound of knee, leg (except thigh), and ankle, complicated 5/91/6384  . Osteoarthritis of both knees 07/31/2014  . Osteoarthrosis, unspecified whether generalized or localized, unspecified site 02/05/2003  . PAF (paroxysmal atrial fibrillation) (HCC)    coumadin  . Pain in joint, lower leg 08/04/2011  . Pruritic condition 01/24/2013  . Quadriceps weakness 07/31/2014  . Right bundle branch block 04/19/2006  . 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 Spinal stenosis in cervical region 12/15/2004  . SSS (sick sinus syndrome) ([place]) 04/08/2014  . Stroke ([place])   . Thyroid disease   . TIA (transient ischemic attack) 04/30/2013  . Type II or unspecified type diabetes mellitus without mention of complication, not stated as uncontrolled 11/24/2004  . Unspecified vitamin D deficiency 10/18/2006  . 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 Increased exposure of the right cornea.    Past Surgical History:  Procedure Laterality Date  . CORONARY ANGIOPLASTY WITH  STENT PLACEMENT  2008   stent to SVG to OM  . CORONARY ARTERY BYPASS GRAFT  1989  . EYE SURGERY    . HERNIA REPAIR    . INSERT / REPLACE / REMOVE PACEMAKER    . JOINT REPLACEMENT    . PACEMAKER INSERTION  2011    Allergies  Allergen Reactions  . Quinolones Other (See Comments)    Risk of rupture of AAA  . Caduet [Amlodipine-Atorvastatin] Other (See Comments)    Weakness   . Crestor [Rosuvastatin] Other (See Comments)    Muscle pain/weakness  . Lipitor [Atorvastatin Calcium] Other (See Comments)    Muscle pain/weakness  . Simvastatin Other (See Comments)    Muscle pain/weakness  . Norvasc [Amlodipine]   . Lisinopril Other (See Comments)    Doesn't remember reaction    Allergies as of 11/09/2017      Reactions   Quinolones Other (See Comments)   Risk of rupture of AAA   Caduet [amlodipine-atorvastatin] Other (See Comments)   Weakness   Crestor [rosuvastatin] Other (See Comments)   Muscle pain/weakness   Lipitor [atorvastatin Calcium] Other (See Comments)   Muscle pain/weakness   Simvastatin Other (See Comments)  Muscle pain/weakness   Norvasc [amlodipine]    Lisinopril Other (See Comments)   Doesn't remember reaction      Medication List        Accurate as of 11/09/17 11:59 PM. Always use your most recent med list.          allopurinol 100 MG tablet Commonly known as:  ZYLOPRIM Take 50 mg by mouth daily.   ALPRAZolam 0.25 MG tablet Commonly known as:  XANAX Take 0.25 mg by mouth every 6 (six) hours as needed for anxiety.   amoxicillin 500 MG capsule Commonly known as:  AMOXIL Take 500 mg by mouth. Take 4 caps prior to dental procedures   apixaban 2.5 MG Tabs tablet Commonly known as:  ELIQUIS Take 1 tablet (2.5 mg total) by mouth 2 (two) times daily.   BIOFREEZE 4 % Gel Generic drug:  Menthol (Topical Analgesic) Apply topically. Apply to left knee four times daily as needed for pain   Cholecalciferol 1000 units tablet Take 2,000 Units by mouth  daily.   furosemide 20 MG tablet Commonly known as:  LASIX Take 60 mg by mouth daily.   furosemide 20 MG tablet Commonly known as:  LASIX Take 20 mg by mouth as needed (weight gain > 3 lbs in 24 hours or >5 lbs in a week).   ipratropium 0.06 % nasal spray Commonly known as:  ATROVENT Place 1 spray into both nostrils 3 (three) times daily as needed for rhinitis.   ipratropium-albuterol 0.5-2.5 (3) MG/3ML Soln Commonly known as:  DUONEB Take 3 mLs by nebulization 4 (four) times daily as needed.   levothyroxine 125 MCG tablet Commonly known as:  SYNTHROID, LEVOTHROID Take 125 mcg by mouth daily before breakfast.   losartan 25 MG tablet Commonly known as:  COZAAR Take 1 tablet (25 mg total) by mouth daily.   meclizine 25 MG tablet Commonly known as:  ANTIVERT Take 25 mg by mouth every 6 (six) hours as needed.   metoprolol tartrate 25 MG tablet Commonly known as:  LOPRESSOR Take 1 tablet (25 mg total) by mouth 2 (two) times daily.   nitroGLYCERIN 0.4 MG SL tablet Commonly known as:  NITROSTAT Place 0.4 mg under the tongue every 5 (five) minutes as needed for chest pain.   polyethylene glycol packet Commonly known as:  MIRALAX / GLYCOLAX Take 17 g by mouth daily as needed.   RETAINE MGD OP Place 1 drop into both eyes at bedtime as needed.   THERATEARS OP Place 1 drop into both eyes at bedtime as needed.   triamcinolone cream 0.1 % Commonly known as:  KENALOG Apply 1 application topically daily.   TRIPLE ANTIBIOTIC [phone] Oint Apply 1 application topically daily as needed (apply to bilateral nostrils).       Review of Systems  Constitutional: Negative for activity change, appetite change, chills, diaphoresis, fatigue and fever.  HENT: Positive for hearing loss and rhinorrhea. Negative for congestion, sore throat and voice change.   Respiratory: Negative for cough, shortness of breath and wheezing.   Cardiovascular: Positive for leg swelling. Negative for  chest pain and palpitations.  Musculoskeletal: Positive for gait problem.  Skin: Positive for wound. Negative for color change, pallor and rash.       Right cheek irritated skin growth.  Psychiatric/Behavioral: Negative for agitation, behavioral problems and sleep disturbance. The patient is not nervous/anxious.     Immunization History  Administered Date(s) Administered  . Influenza Whole 12/06/2011, 12/19/2012  . Influenza-Unspecified 01/09/2014, 11/25/2014, 12/23/2015,  12/26/2016  . PPD Test 05/03/2013  . Pneumococcal Conjugate-13 01/03/2017  . Pneumococcal Polysaccharide-23 03/07/1986  . Td 04/20/2005  . Tdap 01/03/2017   Pertinent  Health Maintenance Due  Topic Date Due  . OPHTHALMOLOGY EXAM  01/12/1932  . FOOT EXAM  07/31/2015  . INFLUENZA VACCINE  10/05/2017  . HEMOGLOBIN A1C  10/23/2017  . PNA vac Low Risk Adult (2 of 2 - PPSV23) 01/03/2018   Fall Risk  11/15/2016 10/15/2015 06/11/2015 10/23/2014 07/07/2014  Falls in the past year? No No Yes Yes Yes  Number falls in past yr: - - '1 1 1  '$ Injury with Fall? - - Yes - No  Risk for fall due to : - - - - History of fall(s);Impaired mobility;Impaired balance/gait  Follow up - - - - Falls evaluation completed;Falls prevention discussed   Functional Status Survey:    Vitals:   11/09/17 1550  BP: 120/70  Pulse: 87  Resp: 20  Temp: 98 F (36.7 C)  SpO2: 96%  Weight: 158 lb 3.2 oz (71.8 kg)  Height: '5\' 8"'$  (1.727 m)   Body mass index is 24.05 kg/m. Physical Exam  Constitutional: He appears well-developed and well-nourished. No distress.  HENT:  Head: Normocephalic and atraumatic.  Nose: Nose normal.  Mouth/Throat: Oropharynx is clear and moist. No oropharyngeal exudate.  Eyes: Pupils are equal, round, and reactive to light. EOM are normal.  Neck: Normal range of motion. Neck supple.  Cardiovascular: Normal rate.  Murmur heard. Irregular heart beats. pacer  Pulmonary/Chest: He has no wheezes. He has no rales.  Abdominal:  Soft. Bowel sounds are normal.  Musculoskeletal: Normal range of motion. He exhibits edema.  Trace edema BLE  Neurological: He is alert.  Oriented to person and place.   Skin: Skin is warm and dry. He is not diaphoretic.  Right cheek skin growth, 1x1cm, raised, regular border, skin color, irritated appearance, no ulceration or s/s of infection.   Psychiatric: He has a normal mood and affect. His behavior is normal.    Labs reviewed: Recent Labs    03/16/17 07/20/17 10/24/17  NA 133* 138 137  K 3.8 4.1 4.6  CL  --  102 100  CO2  --  29 28  BUN 31* 28* 28*  CREATININE 1.2 1.2 1.1  CALCIUM  --  9.3 9.3   Recent Labs    03/12/17 07/20/17  AST 15 16  ALT 10 11  ALKPHOS 89 84  ALBUMIN  --  3.9   Recent Labs    03/12/17 03/16/17 07/20/17  WBC 11.0 10.5 6.2  HGB 14.7 14.1 15.3  HCT 43 40* 45  PLT 225 199 212   Lab Results  Component Value Date   TSH 1.22 10/24/2017   Lab Results  Component Value Date   HGBA1C 6.9 04/25/2017   Lab Results  Component Value Date   CHOL 170 07/20/2017   HDL 39 07/20/2017   LDLCALC 110 07/20/2017   TRIG 107 07/20/2017   CHOLHDL 3.9 05/01/2013    Significant Diagnostic Results in last 30 days:  No results found.  Assessment/Plan  Skin lesion of cheek The right cheek, size about 1x1cm, raised, irritation noted, regular border, no ulceration or discoloration. The patient stated he had dermatology evaluation, didn't have anything done. He desires cream for irritation, will try 1% hydrocortisone oint bid to the growth bid x 3 months. Observe. The skin growth appears like dermatofibroma to me.   Atrial fibrillation ([place]) Heart rate is in control, 11/01/17  EKG Afib, continue Eliquis 2.'5mg'$  bid for thromboembolic risk reduction. F/u Cardilology.    Family/ staff Communication: plan of care reviewed with the patient and charge nurse.   Labs/tests ordered: none  Time spend 25 minutes.

## 2017-11-09 NOTE — Assessment & Plan Note (Signed)
The right cheek, size about 1x1cm, raised, irritation noted, regular border, no ulceration or discoloration. The patient stated he had dermatology evaluation, didn't have anything done. He desires cream for irritation, will try 1% hydrocortisone oint bid to the growth bid x 3 months. Observe. The skin growth appears like dermatofibroma to me.

## 2017-11-21 ENCOUNTER — Non-Acute Institutional Stay: Payer: Medicare Other

## 2017-11-21 DIAGNOSIS — Z0000 Encounter for general adult medical examination without abnormal findings: Secondary | ICD-10-CM | POA: Diagnosis not present

## 2017-11-21 NOTE — Progress Notes (Signed)
Subjective:   Jon [name] is a 84 y.o. male who presents for Medicare Annual/Subsequent preventive examination at [facility]  Last AWV-11/15/2016    Objective:    Vitals: BP 125/72 (BP Location: Left Arm, Patient Position: Sitting)   Pulse 65   Temp 97.9 F (36.6 C) (Oral)   Ht '5\' 8"'  (1.727 m)   Wt 158 lb (71.7 kg)   BMI 24.02 kg/m   Body mass index is 24.02 kg/m.  Advanced Directives 11/21/2017 10/20/2017 10/04/2017 07/19/2017 04/20/2017 03/13/2017 01/05/2017  Does Patient Have a Medical Advance Directive? Yes Yes Yes Yes Yes Yes Yes  Type of Paramedic of [place];Living will;Out of facility DNR (pink MOST or yellow form) [place];Living will;Out of facility DNR (pink MOST or yellow form) [place];Living will;Out of facility DNR (pink MOST or yellow form) [place];Living will;Out of facility DNR (pink MOST or yellow form) [place];Living will;Out of facility DNR (pink MOST or yellow form) [place];Living will;Out of facility DNR (pink MOST or yellow form) [place];Living will;Out of facility DNR (pink MOST or yellow form)  Does patient want to make changes to medical advance directive? No - Patient declined No - Patient declined No - Patient declined No - Patient declined No - Patient declined No - Patient declined No - Patient declined  Copy of [place] in Chart? Yes Yes Yes Yes Yes Yes Yes  Pre-existing out of facility DNR order (yellow form or pink MOST form) Yellow form placed in chart (order not valid for inpatient use) Yellow form placed in chart (order not valid for inpatient use) Yellow form placed in chart (order not valid for inpatient use) Yellow form placed in chart (order not valid for inpatient use) Yellow form placed in chart (order not valid for inpatient use) Yellow form placed in  chart (order not valid for inpatient use) Yellow form placed in chart (order not valid for inpatient use)    Tobacco Social History   Tobacco Use  Smoking Status Former Smoker  . Packs/day: 0.75  . Years: 15.00  . Pack years: 11.25  . Types: Cigarettes  . Last attempt to quit: 03/04/1967  . Years since quitting: 50.7  Smokeless Tobacco Never Used     Counseling given: Not Answered   Clinical Intake:  Pre-visit preparation completed: No  Pain : No/denies pain     Nutritional Risks: None Diabetes: Yes CBG done?: No Did pt. bring in CBG monitor from home?: No     Interpreter Needed?: No  Information entered by :: Tyson Dense, RN  Past Medical History:  Diagnosis Date  . Abnormal PFTs 10/30/2012   Followed in Pulmonary clinic/ [facility]/ [place]  - PFT's 10/22/12 VC  55% no obst, DLCO 50%  - PFT's 12/20/2012 VC 83% and no obst, dLCO 55%  -11/08/2012  Walked RA x 3 laps @ 185 ft each stopped due to  End of study, not desat -11/08/12 esr 10    . Abnormality of gait 04/18/2013  . Acute on chronic systolic congestive heart failure, NYHA class 2 ([place]) 03/16/2006   BNP 376.9 05/28/13   . Adjustment disorder with mixed anxiety and depressed mood 04/18/2013   Post CVA depression and anxiety   . Allergy   . Anxiety   . Aortic aneurysm of unspecified site without mention of rupture 10/27/2011  . Arthritis   .  Atherosclerosis of renal artery ([place]) 10/18/2006  . Atrial fibrillation ([place]) 03/04/2011    He was on coumadin until his stroke last December and was switched to apixaban 2.39m daily which he has been taking faithfully without reported side effects.    . Balance disorder 07/31/2014  . Basal cell carcinoma of skin of other and unspecified parts of face 12/24/2009  . Blood transfusion without reported diagnosis   . Cardiac pacemaker in situ- MDT 10/11 03/18/2010   Medtronic revo implant in October 2011. Severe sinus bradycardia in the 30s, but not truly pacemaker dependent     . Cataract   . CKD stage 2 due to type 2 diabetes mellitus ([place] 07/03/2014  . Coronary artery disease   . CVA - Rt brain stroke 12/14 02/18/2013   02/19/13 angiography CT head:  Diffuse atherosclerotic irregularity and plaque formation of the distal right common carotid artery and proximal internal carotid artery without significant stenosis. Plaque ulceration is present and is a source of emboli. A small right thalamic CVA    . DM (diabetes mellitus), type 2 with renal complications ([place] 47/37/1062 . Dyslipidemia 11/03/2004  . Fall 03/04/2011  . Fatigue 04/18/2013  . Gout, unspecified 11/03/2004  . Heart murmur   . History of abdominal aortic aneurysm   . History of Doppler ultrasound 05/22/2012   LEAs; R anterior tibial artery appeared occluded; L posterior tibial shows short segment of occlusive ds  . History of Doppler ultrasound 05/22/2012   Abdominal Aortic Doppler; slight increase in fusiform aneurysm   . History of echocardiogram 12/2008   EF >55%; mild concentric LVH; mild MR; mild-mod TR; mild AV regurg;   . History of nuclear stress test 12/2010   lexiscan; low risk; compared to prior study, perfusion improved  . HTN (hypertension) 03/04/2011  . Hyperlipidemia   . Hypertension   . Hypertrophy of prostate with urinary obstruction and other lower urinary tract symptoms (LUTS) 04/28/2004  . Hyponatremia 03/04/2011  . Hypothyroidism 03/04/2011  . Impotence of organic origin 10/03/2000  . Internal hemorrhoids without mention of complication 169/48/5462 . Long term (current) use of anticoagulants 03/17/2011  . Major depressive disorder, single episode, unspecified 03/05/2009  . Memory change 01/24/2013  . Muscle weakness (generalized) 03/10/2011  . Nocturia 10/27/2011  . Obstructive sleep apnea (adult) (pediatric) 07/23/2008  . Open wound of knee, leg (except thigh), and ankle, complicated 57/05/5007 . Osteoarthritis of both knees 07/31/2014  . Osteoarthrosis, unspecified whether  generalized or localized, unspecified site 02/05/2003  . PAF (paroxysmal atrial fibrillation) (HCC)    coumadin  . Pain in joint, lower leg 08/04/2011  . Pruritic condition 01/24/2013  . Quadriceps weakness 07/31/2014  . Right bundle branch block 04/19/2006  . 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 Spinal stenosis in cervical region 12/15/2004  . SSS (sick sinus syndrome) ([place] 04/08/2014  . Stroke ([place]   . Thyroid disease   . TIA (transient ischemic attack) 04/30/2013  . Type II or unspecified type diabetes mellitus without mention of complication, not stated as uncontrolled 11/24/2004  . Unspecified vitamin D deficiency 10/18/2006  . 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 Increased exposure of the right cornea.    Past Surgical History:  Procedure Laterality Date  . CORONARY ANGIOPLASTY WITH STENT PLACEMENT  2008   stent to SVG to OM  . CORONARY ARTERY BYPASS GRAFT  1989  . EYE SURGERY    .  HERNIA REPAIR    . INSERT / REPLACE / REMOVE PACEMAKER    . JOINT REPLACEMENT    . PACEMAKER INSERTION  2011   Family History  Problem Relation Age of Onset  . Diabetes Father   . Other Mother        PAD with amputations   Social History   Socioeconomic History  . Marital status: Widowed    Spouse name: Not on file  . Number of children: 3  . Years of education: BA  . Highest education level: Not on file  Occupational History  . Occupation: Retired Land  . Financial resource strain: Not hard at all  . Food insecurity:    Worry: Never true    Inability: Never true  . Transportation needs:    Medical: No    Non-medical: No  Tobacco Use  . Smoking status: Former Smoker    Packs/day: 0.75    Years: 15.00    Pack years: 11.25    Types: Cigarettes    Last attempt to quit: 03/04/1967    Years since quitting: 50.7  . Smokeless tobacco: Never Used  Substance and Sexual Activity  .  Alcohol use: No    Alcohol/week: 0.0 standard drinks  . Drug use: No  . Sexual activity: Never  Lifestyle  . Physical activity:    Days per week: 7 days    Minutes per session: 10 min  . Stress: Only a little  Relationships  . Social connections:    Talks on phone: Twice a week    Gets together: Twice a week    Attends religious service: Never    Active member of club or organization: No    Attends meetings of clubs or organizations: Never    Relationship status: Widowed  Other Topics Concern  . Not on file  Social History Narrative   Lives at [facility] since 03/2009, moved to [place] 04/2013   Widowed    Stopped smoking 1968   Alcohol none   Exercise  none   Walks with walker   Living Will, POA          Outpatient Encounter Medications as of 11/21/2017  Medication Sig  . allopurinol (ZYLOPRIM) 100 MG tablet Take 50 mg by mouth daily.   Marland Kitchen ALPRAZolam (XANAX) 0.25 MG tablet Take 0.25 mg by mouth every 6 (six) hours as needed for anxiety.  Marland Kitchen amoxicillin (AMOXIL) 500 MG capsule Take 500 mg by mouth. Take 4 caps prior to dental procedures  . apixaban (ELIQUIS) 2.5 MG TABS tablet Take 1 tablet (2.5 mg total) by mouth 2 (two) times daily.  . Carboxymethylcellulose Sodium (THERATEARS OP) Place 1 drop into both eyes at bedtime as needed.  . Cholecalciferol 1000 units tablet Take 2,000 Units by mouth daily.  . furosemide (LASIX) 20 MG tablet Take 60 mg by mouth daily.   . furosemide (LASIX) 20 MG tablet Take 20 mg by mouth as needed (weight gain > 3 lbs in 24 hours or >5 lbs in a week).  Marland Kitchen ipratropium (ATROVENT) 0.06 % nasal spray Place 1 spray into both nostrils 3 (three) times daily as needed for rhinitis.  Marland Kitchen ipratropium-albuterol (DUONEB) 0.5-2.5 (3) MG/3ML SOLN Take 3 mLs by nebulization 4 (four) times daily as needed.  Marland Kitchen levothyroxine (SYNTHROID, LEVOTHROID) 125 MCG tablet Take 125 mcg by mouth daily before breakfast.  . Light Mineral Oil-Mineral Oil (RETAINE MGD OP) Place  1 drop into both eyes at bedtime  as needed.  Marland Kitchen losartan (COZAAR) 25 MG tablet Take 1 tablet (25 mg total) by mouth daily.  . meclizine (ANTIVERT) 25 MG tablet Take 25 mg by mouth every 6 (six) hours as needed.   . Menthol, Topical Analgesic, (BIOFREEZE) 4 % GEL Apply topically. Apply to left knee four times daily as needed for pain  . metoprolol tartrate (LOPRESSOR) 25 MG tablet Take 1 tablet (25 mg total) by mouth 2 (two) times daily.  Marland Kitchen Neomycin-Bacitracin-Polymyxin (TRIPLE ANTIBIOTIC) 3.5-7733716263 OINT Apply 1 application topically daily as needed (apply to bilateral nostrils).  . nitroGLYCERIN (NITROSTAT) 0.4 MG SL tablet Place 0.4 mg under the tongue every 5 (five) minutes as needed for chest pain.   . polyethylene glycol (MIRALAX / GLYCOLAX) packet Take 17 g by mouth daily as needed.   . triamcinolone cream (KENALOG) 0.1 % Apply 1 application topically daily.   No facility-administered encounter medications on file as of 11/21/2017.     Activities of Daily Living In your present state of health, do you have any difficulty performing the following activities: 11/21/2017  Hearing? Y  Vision? N  Difficulty concentrating or making decisions? Y  Walking or climbing stairs? Y  Dressing or bathing? Y  Doing errands, shopping? Y  Preparing Food and eating ? Y  Using the Toilet? N  In the past six months, have you accidently leaked urine? N  Do you have problems with loss of bowel control? N  Managing your Medications? Y  Managing your Finances? Y  Housekeeping or managing your Housekeeping? Y  Some recent data might be hidden    Patient Care Team: Blanchie Serve, MD as PCP - General (Internal Medicine) [place], [facility] Ms, Nadean Corwin, MD as Consulting Physician (Cardiology) Ardis Hughs, MD as Attending Physician (Urology) Mast, Man X, NP as Nurse Practitioner (Internal Medicine)   Assessment:   This is a routine wellness examination for [employer].  Exercise Activities  and Dietary recommendations Current Exercise Habits: Home exercise routine, Type of exercise: strength training/weights, Time (Minutes): 15, Frequency (Times/Week): 7, Weekly Exercise (Minutes/Week): 105, Intensity: Mild, Exercise limited by: orthopedic condition(s)  Goals   None     Fall Risk Fall Risk  11/21/2017 11/15/2016 10/15/2015 06/11/2015 10/23/2014  Falls in the past year? No No No Yes Yes  Number falls in past yr: - - - 1 1  Injury with Fall? - - - Yes -  Risk for fall due to : - - - - -  Follow up - - - - -   Is the patient's home free of loose throw rugs in walkways, pet beds, electrical cords, etc?   yes      Grab bars in the bathroom? yes      Handrails on the stairs?   yes      Adequate lighting?   yes  Depression Screen PHQ 2/9 Scores 11/21/2017 11/15/2016 06/11/2015 07/07/2014  PHQ - 2 Score 0 0 0 0    Cognitive Function completed within the last year MMSE - [place] Exam 07/27/2017 11/15/2016  Orientation to time 5 5  Orientation to Place 5 5  Registration 3 3  Attention/ Calculation 5 5  Recall 3 3  Language- name 2 objects 2 2  Language- repeat 1 1  Language- follow 3 step command 3 3  Language- read & follow direction 1 1  Write a sentence 1 1  Copy design 1 1  Total score 30 30  Immunization History  Administered Date(s) Administered  . Influenza Whole 12/06/2011, 12/19/2012  . Influenza-Unspecified 01/09/2014, 11/25/2014, 12/23/2015, 12/26/2016  . PPD Test 05/03/2013  . Pneumococcal Conjugate-13 01/03/2017  . Pneumococcal Polysaccharide-23 03/07/1986  . Td 04/20/2005  . Tdap 01/03/2017    Qualifies for Shingles Vaccine? Not in past records  Screening Tests Health Maintenance  Topic Date Due  . OPHTHALMOLOGY EXAM  01/12/1932  . FOOT EXAM  07/31/2015  . INFLUENZA VACCINE  10/05/2017  . HEMOGLOBIN A1C  10/23/2017  . PNA vac Low Risk Adult (2 of 2 - PPSV23) 01/03/2018  . TETANUS/TDAP  01/04/2027   Cancer Screenings: Lung: Low  Dose CT Chest recommended if Age 87-80 years, 30 pack-year currently smoking OR have quit w/in 15years. Patient does not qualify. Colorectal: up to date  Additional Screenings:  Hepatitis C Screening:declined Flu vaccine due: will receive at [facility]:    I have personally reviewed and addressed the Medicare Annual Wellness questionnaire and have noted the following in the patient's chart:  A. Medical and social history B. Use of alcohol, tobacco or illicit drugs  C. Current medications and supplements D. Functional ability and status E.  Nutritional status F.  Physical activity G. Advance directives H. List of other physicians I.  Hospitalizations, surgeries, and ER visits in previous 12 months J.  [place] to include hearing, vision, cognitive, depression L. Referrals and appointments - none  In addition, I have reviewed and discussed with patient certain preventive protocols, quality metrics, and best practice recommendations. A written personalized care plan for preventive services as well as general preventive health recommendations were provided to patient.  See attached scanned questionnaire for additional information.   Signed,   Tyson Dense, RN Nurse Health Advisor  Patient Concerns: None

## 2017-11-21 NOTE — Patient Instructions (Addendum)
Jon [name] , Thank you for taking time to come for your Medicare Wellness Visit. I appreciate your ongoing commitment to your health goals. Please review the following plan we discussed and let me know if I can assist you in the future.   Screening recommendations/referrals: Colonoscopy excluded, over age 82 Recommended yearly ophthalmology/optometry visit for glaucoma screening and checkup Recommended yearly dental visit for hygiene and checkup  Vaccinations: Influenza vaccine due, will receive at [facility] Pneumococcal vaccine up to date, completed Tdap vaccine up to date, due 01/04/2027 Shingles vaccine not in past records    Advanced directives: in chart  Conditions/risks identified: none  Next appointment: Dr. Bubba Camp makes rounds  Preventive Care 51 Years and Older, Male Preventive care refers to lifestyle choices and visits with your health care provider that can promote health and wellness. What does preventive care include?  A yearly physical exam. This is also called an annual well check.  Dental exams once or twice a year.  Routine eye exams. Ask your health care provider how often you should have your eyes checked.  Personal lifestyle choices, including:  Daily care of your teeth and gums.  Regular physical activity.  Eating a healthy diet.  Avoiding tobacco and drug use.  Limiting alcohol use.  Practicing safe sex.  Taking low doses of aspirin every day.  Taking vitamin and mineral supplements as recommended by your health care provider. What happens during an annual well check? The services and screenings done by your health care provider during your annual well check will depend on your age, overall health, lifestyle risk factors, and family history of disease. Counseling  Your health care provider may ask you questions about your:  Alcohol use.  Tobacco use.  Drug use.  Emotional well-being.  Home and relationship well-being.  Sexual  activity.  Eating habits.  History of falls.  Memory and ability to understand (cognition).  Work and work Statistician. Screening  You may have the following tests or measurements:  Height, weight, and BMI.  Blood pressure.  Lipid and cholesterol levels. These may be checked every 5 years, or more frequently if you are over 39 years old.  Skin check.  Lung cancer screening. You may have this screening every year starting at age 52 if you have a 30-pack-year history of smoking and currently smoke or have quit within the past 15 years.  Fecal occult blood test (FOBT) of the stool. You may have this test every year starting at age 76.  Flexible sigmoidoscopy or colonoscopy. You may have a sigmoidoscopy every 5 years or a colonoscopy every 10 years starting at age 82.  Prostate cancer screening. Recommendations will vary depending on your family history and other risks.  Hepatitis C blood test.  Hepatitis B blood test.  Sexually transmitted disease (STD) testing.  Diabetes screening. This is done by checking your blood sugar (glucose) after you have not eaten for a while (fasting). You may have this done every 1-3 years.  Abdominal aortic aneurysm (AAA) screening. You may need this if you are a current or former smoker.  Osteoporosis. You may be screened starting at age 73 if you are at high risk. Talk with your health care provider about your test results, treatment options, and if necessary, the need for more tests. Vaccines  Your health care provider may recommend certain vaccines, such as:  Influenza vaccine. This is recommended every year.  Tetanus, diphtheria, and acellular pertussis (Tdap, Td) vaccine. You may need a Td booster  every 10 years.  Zoster vaccine. You may need this after age 22.  Pneumococcal 13-valent conjugate (PCV13) vaccine. One dose is recommended after age 14.  Pneumococcal polysaccharide (PPSV23) vaccine. One dose is recommended after age  75. Talk to your health care provider about which screenings and vaccines you need and how often you need them. This information is not intended to replace advice given to you by your health care provider. Make sure you discuss any questions you have with your health care provider. Document Released: 03/20/2015 Document Revised: 11/11/2015 Document Reviewed: 12/23/2014 Elsevier Interactive Patient Education  2017 [place] Prevention in the Home Falls can cause injuries. They can happen to people of all ages. There are many things you can do to make your home safe and to help prevent falls. What can I do on the outside of my home?  Regularly fix the edges of walkways and driveways and fix any cracks.  Remove anything that might make you trip as you walk through a door, such as a raised step or threshold.  Trim any bushes or trees on the path to your home.  Use bright outdoor lighting.  Clear any walking paths of anything that might make someone trip, such as rocks or tools.  Regularly check to see if handrails are loose or broken. Make sure that both sides of any steps have handrails.  Any raised decks and porches should have guardrails on the edges.  Have any leaves, snow, or ice cleared regularly.  Use sand or salt on walking paths during winter.  Clean up any spills in your garage right away. This includes oil or grease spills. What can I do in the bathroom?  Use night lights.  Install grab bars by the toilet and in the tub and shower. Do not use towel bars as grab bars.  Use non-skid mats or decals in the tub or shower.  If you need to sit down in the shower, use a plastic, non-slip stool.  Keep the floor dry. Clean up any water that spills on the floor as soon as it happens.  Remove soap buildup in the tub or shower regularly.  Attach bath mats securely with double-sided non-slip rug tape.  Do not have throw rugs and other things on the floor that can make  you trip. What can I do in the bedroom?  Use night lights.  Make sure that you have a light by your bed that is easy to reach.  Do not use any sheets or blankets that are too big for your bed. They should not hang down onto the floor.  Have a firm chair that has side arms. You can use this for support while you get dressed.  Do not have throw rugs and other things on the floor that can make you trip. What can I do in the kitchen?  Clean up any spills right away.  Avoid walking on wet floors.  Keep items that you use a lot in easy-to-reach places.  If you need to reach something above you, use a strong step stool that has a grab bar.  Keep electrical cords out of the way.  Do not use floor polish or wax that makes floors slippery. If you must use wax, use non-skid floor wax.  Do not have throw rugs and other things on the floor that can make you trip. What can I do with my stairs?  Do not leave any items on the stairs.  Make  sure that there are handrails on both sides of the stairs and use them. Fix handrails that are broken or loose. Make sure that handrails are as long as the stairways.  Check any carpeting to make sure that it is firmly attached to the stairs. Fix any carpet that is loose or worn.  Avoid having throw rugs at the top or bottom of the stairs. If you do have throw rugs, attach them to the floor with carpet tape.  Make sure that you have a light switch at the top of the stairs and the bottom of the stairs. If you do not have them, ask someone to add them for you. What else can I do to help prevent falls?  Wear shoes that:  Do not have high heels.  Have rubber bottoms.  Are comfortable and fit you well.  Are closed at the toe. Do not wear sandals.  If you use a stepladder:  Make sure that it is fully opened. Do not climb a closed stepladder.  Make sure that both sides of the stepladder are locked into place.  Ask someone to hold it for you, if  possible.  Clearly mark and make sure that you can see:  Any grab bars or handrails.  First and last steps.  Where the edge of each step is.  Use tools that help you move around (mobility aids) if they are needed. These include:  Canes.  Walkers.  Scooters.  Crutches.  Turn on the lights when you go into a dark area. Replace any light bulbs as soon as they burn out.  Set up your furniture so you have a clear path. Avoid moving your furniture around.  If any of your floors are uneven, fix them.  If there are any pets around you, be aware of where they are.  Review your medicines with your doctor. Some medicines can make you feel dizzy. This can increase your chance of falling. Ask your doctor what other things that you can do to help prevent falls. This information is not intended to replace advice given to you by your health care provider. Make sure you discuss any questions you have with your health care provider. Document Released: 12/18/2008 Document Revised: 07/30/2015 Document Reviewed: 03/28/2014 Elsevier Interactive Patient Education  2017 [employer].

## 2017-11-22 ENCOUNTER — Encounter: Payer: Self-pay | Admitting: Internal Medicine

## 2017-11-22 DIAGNOSIS — L820 Inflamed seborrheic keratosis: Secondary | ICD-10-CM | POA: Diagnosis not present

## 2017-11-22 DIAGNOSIS — Z85828 Personal history of other malignant neoplasm of skin: Secondary | ICD-10-CM | POA: Diagnosis not present

## 2017-11-22 DIAGNOSIS — D485 Neoplasm of uncertain behavior of skin: Secondary | ICD-10-CM | POA: Diagnosis not present

## 2017-12-20 ENCOUNTER — Ambulatory Visit (INDEPENDENT_AMBULATORY_CARE_PROVIDER_SITE_OTHER): Payer: Medicare Other | Admitting: *Deleted

## 2017-12-20 ENCOUNTER — Telehealth: Payer: Self-pay | Admitting: Cardiology

## 2017-12-20 DIAGNOSIS — Z950 Presence of cardiac pacemaker: Secondary | ICD-10-CM

## 2017-12-20 NOTE — Telephone Encounter (Signed)
Spoke with pt and reminded pt of remote transmission that is due today. Pt verbalized understanding.   

## 2017-12-21 NOTE — Progress Notes (Signed)
Remote pacemaker transmission.   

## 2017-12-22 ENCOUNTER — Encounter: Payer: Self-pay | Admitting: Cardiology

## 2018-01-03 ENCOUNTER — Encounter: Payer: Self-pay | Admitting: Cardiovascular Disease

## 2018-01-03 ENCOUNTER — Encounter: Payer: Self-pay | Admitting: *Deleted

## 2018-01-03 ENCOUNTER — Ambulatory Visit (INDEPENDENT_AMBULATORY_CARE_PROVIDER_SITE_OTHER): Payer: Medicare Other | Admitting: Cardiovascular Disease

## 2018-01-03 VITALS — BP 120/60 | HR 62 | Ht 68.0 in | Wt 160.0 lb

## 2018-01-03 DIAGNOSIS — I714 Abdominal aortic aneurysm, without rupture, unspecified: Secondary | ICD-10-CM

## 2018-01-03 DIAGNOSIS — I5032 Chronic diastolic (congestive) heart failure: Secondary | ICD-10-CM | POA: Diagnosis not present

## 2018-01-03 DIAGNOSIS — I25810 Atherosclerosis of coronary artery bypass graft(s) without angina pectoris: Secondary | ICD-10-CM

## 2018-01-03 DIAGNOSIS — I2510 Atherosclerotic heart disease of native coronary artery without angina pectoris: Secondary | ICD-10-CM

## 2018-01-03 DIAGNOSIS — I4821 Permanent atrial fibrillation: Secondary | ICD-10-CM

## 2018-01-03 DIAGNOSIS — I495 Sick sinus syndrome: Secondary | ICD-10-CM | POA: Diagnosis not present

## 2018-01-03 DIAGNOSIS — Z950 Presence of cardiac pacemaker: Secondary | ICD-10-CM

## 2018-01-03 DIAGNOSIS — Z7901 Long term (current) use of anticoagulants: Secondary | ICD-10-CM

## 2018-01-03 NOTE — Patient Instructions (Signed)
Medication Instructions:  Dr Sallyanne Kuster recommends that you continue on your current medications as directed. Please refer to the Current Medication list given to you today.  If you need a refill on your cardiac medications before your next appointment, please call your pharmacy.   Testing/Procedures: Remote monitoring is used to monitor your Pacemaker of ICD from home. This monitoring reduces the number of office visits required to check your device to one time per year. It allows [name] to keep an eye on the functioning of your device to ensure it is working properly. You are scheduled for a device check from home on Monday, November 18th, 2019. You may send your transmission at any time that day. If you have a wireless device, the transmission will be sent automatically. After your physician reviews your transmission, you will receive a postcard with your next transmission date.  Follow-Up: At [facility], you and your health needs are our priority.  As part of our continuing mission to provide you with exceptional heart care, we have created designated Provider Care Teams.  These Care Teams include your primary Cardiologist (physician) and Advanced Practice Providers (APPs -  Physician Assistants and Nurse Practitioners) who all work together to provide you with the care you need, when you need it. You will need a follow up appointment in 12 months.  Please call our office 2 months in advance to schedule this appointment.  You may see Sanda Klein, MD or one of the following Advanced Practice Providers on your designated Care Team: [place], [place] . Fabian Sharp, PA-C

## 2018-01-04 ENCOUNTER — Encounter: Payer: Self-pay | Admitting: Nurse Practitioner

## 2018-01-04 ENCOUNTER — Encounter: Payer: Self-pay | Admitting: Cardiovascular Disease

## 2018-01-04 NOTE — Progress Notes (Signed)
Patient ID: Jon [name], male   DOB: 07-21-1921, 84 y.o.   MRN: [id number]     Cardiology Office Note    Date:  01/04/2018   ID:  Jon [name], DOB 1921/11/12, MRN [id number]  PCP:  Mast, Man X, NP  Cardiologist:  Pixie Casino, MD; Sanda Klein, MD   chief complaint: fatigue   History of Present Illness:  Jon [name] is a 84 y.o. male with congestive heart failure, permanent atrial fibrillation, history of previous stroke on chronic anticoagulation, CAD s/p CABG, COPD here for follow-up on his permanent pacemaker (initially implanted for sinus node dysfunction with symptomatic bradycardia, now programmed VVIR).  He last saw Dr. Debara Pickett in September 2018.  I last saw him for a pacemaker follow-up in August 2019.  Today's appointment was supposed to be with Dr. Debara Pickett but somehow was made with me.  He has not had any major health challenges, but complains that his speech is getting gradually more difficult.  He has to drink thickened fluids to avoid choking and has regular sessions with a speech pathologist.  He occasionally complains of dyspnea at rest that happens randomly and does not necessarily happen with activity.  He does not have orthopnea or PND.  Denies leg edema and does not have chest pain.  He remains very sedentary  The patient specifically denies any chest pain at rest exertion, dyspnea with exertion, orthopnea, paroxysmal nocturnal dyspnea, syncope, palpitations, focal neurological deficits, intermittent claudication, lower extremity edema, unexplained weight gain, cough, hemoptysis or wheezing.  Comprehensive interrogation of his device in the office today shows normal function. He has an MRI conditional Medtronic Revo device implanted in 2011.  His generator actually reports an improvement in battery voltage up to 2.85 V (RRT 2.81 V).  Based on his current device parameters and ventricular pacing usage he has an expected  25 months of battery longevity  according to the Medtr[employer] technical specialists.  He has 45% ventricular pacing.Marland Kitchen He is in permanent atrial fibrillation and his dual-chamber device is now programmed VVIR.  Past Medical History:  Diagnosis Date  . Abnormal PFTs 10/30/2012   Followed in Pulmonary clinic/ [facility]/ [place]  - PFT's 10/22/12 VC  55% no obst, DLCO 50%  - PFT's 12/20/2012 VC 83% and no obst, dLCO 55%  -11/08/2012  Walked RA x 3 laps @ 185 ft each stopped due to  End of study, not desat -11/08/12 esr 10    . Abnormality of gait 04/18/2013  . Acute on chronic systolic congestive heart failure, NYHA class 2 ([place]) 03/16/2006   BNP 376.9 05/28/13   . Adjustment disorder with mixed anxiety and depressed mood 04/18/2013   Post CVA depression and anxiety   . Allergy   . Anxiety   . Aortic aneurysm of unspecified site without mention of rupture 10/27/2011  . Arthritis   . Atherosclerosis of renal artery ([place]) 10/18/2006  . Atrial fibrillation ([place]) 03/04/2011    He was on coumadin until his stroke last December and was switched to apixaban 2.'5mg'$  daily which he has been taking faithfully without reported side effects.    . Balance disorder 07/31/2014  . Basal cell carcinoma of skin of other and unspecified parts of face 12/24/2009  . Blood transfusion without reported diagnosis   . Cardiac pacemaker in situ- MDT 10/11 03/18/2010   Medtronic revo implant in October 2011. Severe sinus bradycardia in the 30s, but not truly pacemaker dependent   . Cataract   . CKD  stage 2 due to type 2 diabetes mellitus ([place]) 07/03/2014  . Coronary artery disease   . CVA - Rt brain stroke 12/14 02/18/2013   02/19/13 angiography CT head:  Diffuse atherosclerotic irregularity and plaque formation of the distal right common carotid artery and proximal internal carotid artery without significant stenosis. Plaque ulceration is present and is a source of emboli. A small right thalamic CVA    . DM (diabetes mellitus), type 2 with renal complications  ([place]) 9/93/5701  . Dyslipidemia 11/03/2004  . Fall 03/04/2011  . Fatigue 04/18/2013  . Gout, unspecified 11/03/2004  . Heart murmur   . History of abdominal aortic aneurysm   . History of Doppler ultrasound 05/22/2012   LEAs; R anterior tibial artery appeared occluded; L posterior tibial shows short segment of occlusive ds  . History of Doppler ultrasound 05/22/2012   Abdominal Aortic Doppler; slight increase in fusiform aneurysm   . History of echocardiogram 12/2008   EF >55%; mild concentric LVH; mild MR; mild-mod TR; mild AV regurg;   . History of nuclear stress test 12/2010   lexiscan; low risk; compared to prior study, perfusion improved  . HTN (hypertension) 03/04/2011  . Hyperlipidemia   . Hypertension   . Hypertrophy of prostate with urinary obstruction and other lower urinary tract symptoms (LUTS) 04/28/2004  . Hyponatremia 03/04/2011  . Hypothyroidism 03/04/2011  . Impotence of organic origin 10/03/2000  . Internal hemorrhoids without mention of complication 77/93/9030  . Long term (current) use of anticoagulants 03/17/2011  . Major depressive disorder, single episode, unspecified 03/05/2009  . Memory change 01/24/2013  . Muscle weakness (generalized) 03/10/2011  . Nocturia 10/27/2011  . Obstructive sleep apnea (adult) (pediatric) 07/23/2008  . Open wound of knee, leg (except thigh), and ankle, complicated 0/92/3300  . Osteoarthritis of both knees 07/31/2014  . Osteoarthrosis, unspecified whether generalized or localized, unspecified site 02/05/2003  . PAF (paroxysmal atrial fibrillation) (HCC)    coumadin  . Pain in joint, lower leg 08/04/2011  . Pruritic condition 01/24/2013  . Quadriceps weakness 07/31/2014  . Right bundle branch block 04/19/2006  . 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 Spinal stenosis in cervical region 12/15/2004  . SSS (sick sinus syndrome) ([place]) 04/08/2014  . Stroke ([place])   . Thyroid disease   . TIA  (transient ischemic attack) 04/30/2013  . Type II or unspecified type diabetes mellitus without mention of complication, not stated as uncontrolled 11/24/2004  . Unspecified vitamin D deficiency 10/18/2006  . 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 Increased exposure of the right cornea.     Past Surgical History:  Procedure Laterality Date  . CORONARY ANGIOPLASTY WITH STENT PLACEMENT  2008   stent to SVG to OM  . CORONARY ARTERY BYPASS GRAFT  1989  . EYE SURGERY    . HERNIA REPAIR    . INSERT / REPLACE / REMOVE PACEMAKER    . JOINT REPLACEMENT    . PACEMAKER INSERTION  2011    Current Medications: Outpatient Medications Prior to Visit  Medication Sig Dispense Refill  . allopurinol (ZYLOPRIM) 100 MG tablet Take 50 mg by mouth daily.     Marland Kitchen ALPRAZolam (XANAX) 0.25 MG tablet Take 0.25 mg by mouth every 6 (six) hours as needed for anxiety.    Marland Kitchen amoxicillin (AMOXIL) 500 MG capsule Take 500 mg by mouth. Take 4 caps prior to dental procedures    . apixaban (ELIQUIS) 2.5 MG  TABS tablet Take 1 tablet (2.5 mg total) by mouth 2 (two) times daily. 60 tablet 11  . Carboxymethylcellulose Sodium (THERATEARS OP) Place 1 drop into both eyes at bedtime as needed.    . Cholecalciferol 1000 units tablet Take 2,000 Units by mouth daily.    . furosemide (LASIX) 20 MG tablet Take 60 mg by mouth daily.     . furosemide (LASIX) 20 MG tablet Take 20 mg by mouth as needed (weight gain > 3 lbs in 24 hours or >5 lbs in a week).    Marland Kitchen ipratropium (ATROVENT) 0.06 % nasal spray Place 1 spray into both nostrils 3 (three) times daily as needed for rhinitis.    Marland Kitchen ipratropium-albuterol (DUONEB) 0.5-2.5 (3) MG/3ML SOLN Take 3 mLs by nebulization 4 (four) times daily as needed.    Marland Kitchen levothyroxine (SYNTHROID, LEVOTHROID) 125 MCG tablet Take 125 mcg by mouth daily before breakfast.    . Light Mineral Oil-Mineral Oil (RETAINE MGD OP) Place 1 drop into both eyes at bedtime as needed.    Marland Kitchen losartan (COZAAR)  25 MG tablet Take 1 tablet (25 mg total) by mouth daily. 90 tablet 3  . meclizine (ANTIVERT) 25 MG tablet Take 25 mg by mouth every 6 (six) hours as needed.     . Menthol, Topical Analgesic, (BIOFREEZE) 4 % GEL Apply topically. Apply to left knee four times daily as needed for pain    . metoprolol tartrate (LOPRESSOR) 25 MG tablet Take 1 tablet (25 mg total) by mouth 2 (two) times daily. 180 tablet 3  . Neomycin-Bacitracin-Polymyxin (TRIPLE ANTIBIOTIC) [phone] OINT Apply 1 application topically daily as needed (apply to bilateral nostrils).    . nitroGLYCERIN (NITROSTAT) 0.4 MG SL tablet Place 0.4 mg under the tongue every 5 (five) minutes as needed for chest pain.     . polyethylene glycol (MIRALAX / GLYCOLAX) packet Take 17 g by mouth daily as needed.     . triamcinolone cream (KENALOG) 0.1 % Apply 1 application topically daily.     No facility-administered medications prior to visit.      Allergies:   Quinolones; Caduet [amlodipine-atorvastatin]; Crestor [rosuvastatin]; Lipitor [atorvastatin calcium]; Simvastatin; Norvasc [amlodipine]; and Lisinopril   Social History   Socioeconomic History  . Marital status: Widowed    Spouse name: Not on file  . Number of children: 3  . Years of education: BA  . Highest education level: Not on file  Occupational History  . Occupation: Retired Land  . Financial resource strain: Not hard at all  . Food insecurity:    Worry: Never true    Inability: Never true  . Transportation needs:    Medical: No    Non-medical: No  Tobacco Use  . Smoking status: Former Smoker    Packs/day: 0.75    Years: 15.00    Pack years: 11.25    Types: Cigarettes    Last attempt to quit: 03/04/1967    Years since quitting: 50.8  . Smokeless tobacco: Never Used  Substance and Sexual Activity  . Alcohol use: No    Alcohol/week: 0.0 standard drinks  . Drug use: No  . Sexual activity: Never  Lifestyle  . Physical activity:    Days  per week: 7 days    Minutes per session: 10 min  . Stress: Only a little  Relationships  . Social connections:    Talks on phone: Twice a week    Gets together: Twice a week  Attends religious service: Never    Active member of club or organization: No    Attends meetings of clubs or organizations: Never    Relationship status: Widowed  Other Topics Concern  . Not on file  Social History Narrative   Lives at [facility] since 03/2009, moved to [place] 04/2013   Widowed    Stopped smoking 1968   Alcohol none   Exercise  none   Walks with walker   Living Will, POA           Family History:  The patient's family history includes Diabetes in his father; Other in his mother.   ROS:   Please see the history of present illness.    ROS All other systems reviewed and are negative.   PHYSICAL EXAM:   VS:  BP 120/60   Pulse 62   Ht '5\' 8"'$  (1.727 m)   Wt 160 lb (72.6 kg)   BMI 24.33 kg/m      General: Alert, oriented x3, no distress, healthy left subclavian pacemaker site Head: no evidence of trauma, PERRL, EOMI, no exophtalmos or lid lag, no myxedema, no xanthelasma; normal ears, nose and oropharynx Neck: normal jugular venous pulsations and no hepatojugular reflux; brisk carotid pulses without delay and no carotid bruits Chest: clear to auscultation, no signs of consolidation by percussion or palpation, normal fremitus, symmetrical and full respiratory excursions Cardiovascular: normal position and quality of the apical impulse, irregular rhythm, normal first and second heart sounds, no murmurs, rubs or gallops Abdomen: no tenderness or distention, no masses by palpation, no abnormal pulsatility or arterial bruits, normal bowel sounds, no hepatosplenomegaly Extremities: no clubbing, cyanosis or edema; 2+ radial, ulnar and brachial pulses bilaterally; 2+ right femoral, posterior tibial and dorsalis pedis pulses; 2+ left femoral, posterior tibial and dorsalis pedis pulses; no  subclavian or femoral bruits Neurological: grossly nonfocal Psych: Normal mood and affect     Wt Readings from Last 3 Encounters:  01/03/18 160 lb (72.6 kg)  11/21/17 158 lb (71.7 kg)  11/09/17 158 lb 3.2 oz (71.8 kg)      Studies/Labs Reviewed:   EKG:  EKG is not ordered today.  The ECG ordered on October 24, 2017 shows atrial fibrillation with intermittent ventricular pacing.  The native QRS shows right bundle branch block.  There are inferolateral ST depression and T wave changes, could represent ischemia or "memory T waves".  Recent Labs: 07/20/2017: ALT 11; Hemoglobin 15.3; Platelets 212 10/24/2017: BUN 28; Creatinine 1.1; Potassium 4.6; Sodium 137; TSH 1.22   Lipid Panel    Component Value Date/Time   CHOL 170 07/20/2017   TRIG 107 07/20/2017   HDL 39 07/20/2017   CHOLHDL 3.9 05/01/2013 0528   VLDL 16 05/01/2013 0528   LDLCALC 110 07/20/2017    ASSESSMENT:    1. Chronic diastolic heart failure ([place])   2. Coronary artery disease involving native coronary artery of native heart without angina pectoris   3. Permanent atrial fibrillation   4. Long term current use of anticoagulant   5. Pacemaker   6. SSS (sick sinus syndrome) ([place])   7. AAA (abdominal aortic aneurysm) without rupture (HCC)     PLAN:  In order of problems listed above:  1. CHF: Clinically euvolemic, has not required an adjustment in diuretic dose.  He monitors his weight carefully.  He remains very sedentary.  NYHA functional class II, I guess.    He remains on low doses of ARB and beta-blocker in addition  to his diuretic 2. CAD: Well over 10 years have passed since his last catheterization procedure without recurrence of angina pectoris.  He is not on statins but has been intolerant of multiple agents including simvastatin, atorvastatin, rosuvastatin; will defer to Dr. Debara Pickett whether or not it is worth pursuing PCSK9 inhibitors in this 84 year old gentleman 3. AFib: I think on the current medications we  are striking a fair balance between avoiding rapid ventricular rates and causing excessive ventricular pacing.Marland Kitchen CHADSVasc 8 (age 72, CVA 2, HTN, DM, CHF, CAD and AAA). 4. Anticoagulation: On adjusted dose Eliquis (age, body size), without any bleeding problems 5. PPM: His device was initially implanted for sick sinus syndrome but he now has permanent atrial fibrillation.  This on the report from the [employer]tr[employer] technical services, he actually has another 25 months of anticipated battery longevity.  We will try to get remote downloads every 3 months and the yearly office visit. 6. AAA: Asymptomatic and stable in diameter at 4.2 cm by last scan performed in April 2019.    Medication Adjustments/Labs and Tests Ordered: Current medicines are reviewed at length with the patient today.  Concerns regarding medicines are outlined above.  Medication changes, Labs and Tests ordered today are listed in the Patient Instructions below. Patient Instructions  Medication Instructions:  Dr Sallyanne Kuster recommends that you continue on your current medications as directed. Please refer to the Current Medication list given to you today.  If you need a refill on your cardiac medications before your next appointment, please call your pharmacy.   Testing/Procedures: Remote monitoring is used to monitor your Pacemaker of ICD from home. This monitoring reduces the number of office visits required to check your device to one time per year. It allows [name] to keep an eye on the functioning of your device to ensure it is working properly. You are scheduled for a device check from home on Monday, November 18th, 2019. You may send your transmission at any time that day. If you have a wireless device, the transmission will be sent automatically. After your physician reviews your transmission, you will receive a postcard with your next transmission date.  Follow-Up: At [facility], you and your health needs are our priority.  As part  of our continuing mission to provide you with exceptional heart care, we have created designated Provider Care Teams.  These Care Teams include your primary Cardiologist (physician) and Advanced Practice Providers (APPs -  Physician Assistants and Nurse Practitioners) who all work together to provide you with the care you need, when you need it. You will need a follow up appointment in 12 months.  Please call our office 2 months in advance to schedule this appointment.  You may see Sanda Klein, MD or one of the following Advanced Practice Providers on your designated Care Team: [place], [place] . Fabian Sharp, PA-C    Signed, Sanda Klein, MD  01/04/2018 3:00 PM    [address], [place], [place]  [address] Phone: [phone]; Fax: [phone]

## 2018-01-11 ENCOUNTER — Encounter: Payer: Self-pay | Admitting: *Deleted

## 2018-01-11 DIAGNOSIS — F4323 Adjustment disorder with mixed anxiety and depressed mood: Secondary | ICD-10-CM | POA: Diagnosis not present

## 2018-01-11 NOTE — Progress Notes (Signed)
Patient passed clock test on the MMSE

## 2018-01-18 ENCOUNTER — Non-Acute Institutional Stay: Payer: Medicare Other | Admitting: Family Medicine

## 2018-01-18 ENCOUNTER — Encounter: Payer: Self-pay | Admitting: Family Medicine

## 2018-01-18 DIAGNOSIS — I5022 Chronic systolic (congestive) heart failure: Secondary | ICD-10-CM

## 2018-01-18 DIAGNOSIS — I4821 Permanent atrial fibrillation: Secondary | ICD-10-CM | POA: Diagnosis not present

## 2018-01-18 DIAGNOSIS — R1310 Dysphagia, unspecified: Secondary | ICD-10-CM | POA: Diagnosis not present

## 2018-01-18 DIAGNOSIS — G4730 Sleep apnea, unspecified: Secondary | ICD-10-CM | POA: Diagnosis not present

## 2018-01-18 DIAGNOSIS — R269 Unspecified abnormalities of gait and mobility: Secondary | ICD-10-CM

## 2018-01-18 DIAGNOSIS — M1A00X0 Idiopathic chronic gout, unspecified site, without tophus (tophi): Secondary | ICD-10-CM

## 2018-01-18 DIAGNOSIS — I10 Essential (primary) hypertension: Secondary | ICD-10-CM | POA: Diagnosis not present

## 2018-01-18 NOTE — Progress Notes (Signed)
Provider:  Alain Honey, MD Location:  [facility] Room Number: 902 Place of Service:  ALF (13)  PCP: Mast, Man X, NP Patient Care Team: Mast, Man X, NP as PCP - General (Internal Medicine) [place], Nadean Corwin, MD as Consulting Physician (Cardiology) Ardis Hughs, MD as Attending Physician (Urology) Mast, Man X, NP as Nurse Practitioner (Internal Medicine)  Extended Emergency Contact Information Primary Emergency Contact: [name] of [place] Phone: [phone] Relation: Daughter Secondary Emergency Contact: Eyvonne Mechanic [place] of [place] Phone: [phone] Mobile Phone: [phone] Relation: Daughter  Code Status: DNR Goals of Care: Advanced Directive information Advanced Directives 01/18/2018  Does Patient Have a Medical Advance Directive? Yes  Type of Paramedic of [place];Living will;Out of facility DNR (pink MOST or yellow form)  Does patient want to make changes to medical advance directive? No - Patient declined  Copy of [place] in Chart? Yes - validated most recent copy scanned in chart (See row information)  Pre-existing out of facility DNR order (yellow form or pink MOST form) Yellow form placed in chart (order not valid for inpatient use)      Chief Complaint  Patient presents with  . Medical Management of Chronic Issues    F/u- Afib, DM2, HTN, GERD, constipation, anxiety,    HPI: Patient is a 84 y.o. male seen today for medical management of chronic problems including: G hypothyroidism, out, systolic heart failure, and chronic kidney disease. Really had a nice visit with Mr. Dinovo who have known for many years, him being a former patient.  He lost his wife to colon cancer and pneumonia while they were living in an independent apartment.  He now resides in assisted living.  He has a new lady friend and they have some  musical interest in common.  This seems to provide him a positive thing.  He has no specific complaints today.  He tells me he had a stroke several years back that affected his speech and breathing.  He still continues to saying [facility].  He gets around mostly in an electric wheelchair and uses the walker in his room. He does complain of some daytime sleepiness.  There is a history of sleep apnea and he has a CPAP machine but does not use it.  I suggested this might help his daytime sleepiness.  He sleeps in a recliner now and does not turn over which might make using the CPAP easier  Past Medical History:  Diagnosis Date  . Abnormal PFTs 10/30/2012   Followed in Pulmonary clinic/ [facility]/ [place]  - PFT's 10/22/12 VC  55% no obst, DLCO 50%  - PFT's 12/20/2012 VC 83% and no obst, dLCO 55%  -11/08/2012  Walked RA x 3 laps @ 185 ft each stopped due to  End of study, not desat -11/08/12 esr 10    . Abnormality of gait 04/18/2013  . Acute on chronic systolic congestive heart failure, NYHA class 2 ([place]) 03/16/2006   BNP 376.9 05/28/13   . Adjustment disorder with mixed anxiety and depressed mood 04/18/2013   Post CVA depression and anxiety   . Allergy   . Anxiety   . Aortic aneurysm of unspecified site without mention of rupture 10/27/2011  . Arthritis   . Atherosclerosis of renal artery ([place]) 10/18/2006  . Atrial fibrillation ([place]) 03/04/2011    He was on coumadin until his stroke last December and  was switched to apixaban 2.14m daily which he has been taking faithfully without reported side effects.    . Balance disorder 07/31/2014  . Basal cell carcinoma of skin of other and unspecified parts of face 12/24/2009  . Blood transfusion without reported diagnosis   . Cardiac pacemaker in situ- MDT 10/11 03/18/2010   Medtronic revo implant in October 2011. Severe sinus bradycardia in the 30s, but not truly pacemaker dependent   . Cataract   . CKD stage 2 due to type 2 diabetes  mellitus ([place] 07/03/2014  . Coronary artery disease   . CVA - Rt brain stroke 12/14 02/18/2013   02/19/13 angiography CT head:  Diffuse atherosclerotic irregularity and plaque formation of the distal right common carotid artery and proximal internal carotid artery without significant stenosis. Plaque ulceration is present and is a source of emboli. A small right thalamic CVA    . DM (diabetes mellitus), type 2 with renal complications ([place] 49/52/8413 . Dyslipidemia 11/03/2004  . Fall 03/04/2011  . Fatigue 04/18/2013  . Gout, unspecified 11/03/2004  . Heart murmur   . History of abdominal aortic aneurysm   . History of Doppler ultrasound 05/22/2012   LEAs; R anterior tibial artery appeared occluded; L posterior tibial shows short segment of occlusive ds  . History of Doppler ultrasound 05/22/2012   Abdominal Aortic Doppler; slight increase in fusiform aneurysm   . History of echocardiogram 12/2008   EF >55%; mild concentric LVH; mild MR; mild-mod TR; mild AV regurg;   . History of nuclear stress test 12/2010   lexiscan; low risk; compared to prior study, perfusion improved  . HTN (hypertension) 03/04/2011  . Hyperlipidemia   . Hypertension   . Hypertrophy of prostate with urinary obstruction and other lower urinary tract symptoms (LUTS) 04/28/2004  . Hyponatremia 03/04/2011  . Hypothyroidism 03/04/2011  . Impotence of organic origin 10/03/2000  . Internal hemorrhoids without mention of complication 124/40/1027 . Long term (current) use of anticoagulants 03/17/2011  . Major depressive disorder, single episode, unspecified 03/05/2009  . Memory change 01/24/2013  . Muscle weakness (generalized) 03/10/2011  . Nocturia 10/27/2011  . Obstructive sleep apnea (adult) (pediatric) 07/23/2008  . Open wound of knee, leg (except thigh), and ankle, complicated 52/53/6644 . Osteoarthritis of both knees 07/31/2014  . Osteoarthrosis, unspecified whether generalized or localized, unspecified site 02/05/2003  .  PAF (paroxysmal atrial fibrillation) (HCC)    coumadin  . Pain in joint, lower leg 08/04/2011  . Pruritic condition 01/24/2013  . Quadriceps weakness 07/31/2014  . Right bundle branch block 04/19/2006  . 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 Spinal stenosis in cervical region 12/15/2004  . SSS (sick sinus syndrome) ([place] 04/08/2014  . Stroke ([place]   . Thyroid disease   . TIA (transient ischemic attack) 04/30/2013  . Type II or unspecified type diabetes mellitus without mention of complication, not stated as uncontrolled 11/24/2004  . Unspecified vitamin D deficiency 10/18/2006  . 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 Increased exposure of the right cornea.    Past Surgical History:  Procedure Laterality Date  . CORONARY ANGIOPLASTY WITH STENT PLACEMENT  2008   stent to SVG to OM  . CORONARY ARTERY BYPASS GRAFT  1989  . EYE SURGERY    . HERNIA REPAIR    . INSERT / REPLACE / REMOVE PACEMAKER    . JOINT REPLACEMENT    . PACEMAKER INSERTION  2011    reports that he quit smoking about 50 years ago. His smoking use included cigarettes. He has a 11.25 pack-year smoking history. He has never used smokeless tobacco. He reports that he does not drink alcohol or use drugs. Social History   Socioeconomic History  . Marital status: Widowed    Spouse name: Not on file  . Number of children: 3  . Years of education: BA  . Highest education level: Not on file  Occupational History  . Occupation: Retired Land  . Financial resource strain: Not hard at all  . Food insecurity:    Worry: Never true    Inability: Never true  . Transportation needs:    Medical: No    Non-medical: No  Tobacco Use  . Smoking status: Former Smoker    Packs/day: 0.75    Years: 15.00    Pack years: 11.25    Types: Cigarettes    Last attempt to quit: 03/04/1967    Years since quitting: 50.9  . Smokeless tobacco: Never  Used  Substance and Sexual Activity  . Alcohol use: No    Alcohol/week: 0.0 standard drinks  . Drug use: No  . Sexual activity: Never  Lifestyle  . Physical activity:    Days per week: 7 days    Minutes per session: 10 min  . Stress: Only a little  Relationships  . Social connections:    Talks on phone: Twice a week    Gets together: Twice a week    Attends religious service: Never    Active member of club or organization: No    Attends meetings of clubs or organizations: Never    Relationship status: Widowed  . Intimate partner violence:    Fear of current or ex partner: No    Emotionally abused: No    Physically abused: No    Forced sexual activity: No  Other Topics Concern  . Not on file  Social History Narrative   Lives [facility], moved to [place] 04/2013   Widowed    Stopped smoking 1968   Alcohol none   Exercise  none   Walks with walker   Living Will, POA          Functional Status Survey:    Family History  Problem Relation Age of Onset  . Diabetes Father   . Other Mother        PAD with amputations    Health Maintenance  Topic Date Due  . OPHTHALMOLOGY EXAM  [birth year]/12/1932  . FOOT EXAM  07/31/2015  . HEMOGLOBIN A1C  10/23/2017  . PNA vac Low Risk Adult (2 of 2 - PPSV23) 01/03/2018  . TETANUS/TDAP  01/04/2027  . INFLUENZA VACCINE  Completed    Allergies  Allergen Reactions  . Quinolones Other (See Comments)    Risk of rupture of AAA  . Caduet [Amlodipine-Atorvastatin] Other (See Comments)    Weakness   . Crestor [Rosuvastatin] Other (See Comments)    Muscle pain/weakness  . Lipitor [Atorvastatin Calcium] Other (See Comments)    Muscle pain/weakness  . Simvastatin Other (See Comments)    Muscle pain/weakness  . Norvasc [Amlodipine]   . Lisinopril Other (See Comments)    Doesn't remember reaction    Outpatient Encounter Medications as of 01/18/2018  Medication Sig  . allopurinol (ZYLOPRIM) 100 MG tablet Take 50 mg  by mouth daily.   Marland Kitchen ALPRAZolam (XANAX) 0.25 MG tablet Take 0.25 mg by  mouth every 6 (six) hours as needed for anxiety.  Marland Kitchen amoxicillin (AMOXIL) 500 MG capsule Take 500 mg by mouth. Take 4 caps prior to dental procedures  . apixaban (ELIQUIS) 2.5 MG TABS tablet Take 1 tablet (2.5 mg total) by mouth 2 (two) times daily.  . calcium carbonate (TUMS - DOSED IN MG ELEMENTAL CALCIUM) 500 MG chewable tablet Chew 1 tablet by mouth as needed for indigestion or heartburn.  . Carboxymethylcellulose Sodium (THERATEARS OP) Place 1 drop into both eyes at bedtime as needed.  . Cholecalciferol 1000 units tablet Take 2,000 Units by mouth daily.  . furosemide (LASIX) 20 MG tablet Take 60 mg by mouth daily.   . furosemide (LASIX) 20 MG tablet Take 20 mg by mouth as needed (weight gain > 3 lbs in 24 hours or >5 lbs in a week).  Marland Kitchen ipratropium (ATROVENT) 0.06 % nasal spray Place 1 spray into both nostrils 3 (three) times daily as needed for rhinitis.  Marland Kitchen ipratropium-albuterol (DUONEB) 0.5-2.5 (3) MG/3ML SOLN Take 3 mLs by nebulization 4 (four) times daily as needed.  Marland Kitchen levothyroxine (SYNTHROID, LEVOTHROID) 125 MCG tablet Take 125 mcg by mouth daily before breakfast.  . Light Mineral Oil-Mineral Oil (RETAINE MGD OP) Place 1 drop into both eyes at bedtime as needed.  Marland Kitchen losartan (COZAAR) 25 MG tablet Take 1 tablet (25 mg total) by mouth daily.  . meclizine (ANTIVERT) 25 MG tablet Take 25 mg by mouth every 6 (six) hours as needed.   . Menthol, Topical Analgesic, (BIOFREEZE) 4 % GEL Apply topically. Apply to left knee four times daily as needed for pain  . metoprolol tartrate (LOPRESSOR) 25 MG tablet Take 1 tablet (25 mg total) by mouth 2 (two) times daily.  Marland Kitchen Neomycin-Bacitracin-Polymyxin (TRIPLE ANTIBIOTIC) [phone] OINT Apply 1 application topically daily as needed (apply to bilateral nostrils).  . nitroGLYCERIN (NITROSTAT) 0.4 MG SL tablet Place 0.4 mg under the tongue every 5 (five) minutes as needed for chest pain.     . polyethylene glycol (MIRALAX / GLYCOLAX) packet Take 17 g by mouth daily as needed.   . triamcinolone cream (KENALOG) 0.1 % Apply 1 application topically daily.   No facility-administered encounter medications on file as of 01/18/2018.     Review of Systems  Constitutional: Negative.   HENT: Negative.   Respiratory: Positive for shortness of breath.   Cardiovascular: Negative.   Genitourinary: Negative.   Neurological: Positive for speech difficulty.  Hematological: Negative.   Psychiatric/Behavioral: Positive for sleep disturbance.    Vitals:   01/18/18 0935  BP: 120/70  Pulse: 73  Resp: 20  Temp: (!) 97.1 F (36.2 C)  SpO2: 95%  Weight: 158 lb 6.4 oz (71.8 kg)  Height: _0  (1.727 m)   Body mass index is 24.08 kg/m. Physical Exam  Constitutional: He is oriented to person, place, and time. He appears well-developed and well-nourished.  HENT:  Head: Normocephalic.  Mouth/Throat: Oropharynx is clear and moist.  Eyes:  Patient unable to fully close right lid due to stroke.  Uses ointment for lubrication  Neck: Normal range of motion.  Cardiovascular: Normal rate and regular rhythm.  Murmur heard. Pulmonary/Chest: Effort normal and breath sounds normal.  Abdominal: Soft. Bowel sounds are normal.  Musculoskeletal:  Had one knee replacement and probably needed the other replaced but not done due to heart issues  Neurological: He is alert and oriented to person, place, and time.  Psychiatric: He has a normal mood and affect. His behavior is normal. Judgment  and thought content normal.  Nursing note and vitals reviewed.   Labs reviewed: Basic Metabolic Panel: Recent Labs    03/16/17 07/20/17 10/24/17  NA 133* 138 137  K 3.8 4.1 4.6  CL  --  102 100  CO2  --  29 28  BUN 31* 28* 28*  CREATININE 1.2 1.2 1.1  CALCIUM  --  9.3 9.3   Liver Function Tests: Recent Labs    03/12/17 07/20/17  AST 15 16  ALT 10 11  ALKPHOS 89 84  ALBUMIN  --  3.9   No results  for input(s): LIPASE, AMYLASE in the last 8760 hours. No results for input(s): AMMONIA in the last 8760 hours. CBC: Recent Labs    03/12/17 03/16/17 07/20/17  WBC 11.0 10.5 6.2  HGB 14.7 14.1 15.3  HCT 43 40* 45  PLT 225 199 212   Cardiac Enzymes: No results for input(s): CKTOTAL, CKMB, CKMBINDEX, TROPONINI in the last 8760 hours. BNP: Invalid input(s): POCBNP Lab Results  Component Value Date   HGBA1C 6.9 04/25/2017   Lab Results  Component Value Date   TSH 1.22 10/24/2017   Lab Results  Component Value Date   UYQIHKVQ25 956 09/14/2016   No results found for: FOLATE No results found for: IRON, TIBC, FERRITIN  Imaging and Procedures obtained prior to SNF admission: Aaa Duplex  Result Date: 06/07/2017 ABDOMINAL AORTA STUDY Indications: Follow up exam for known AAA. Risk Factors: Hypertension, hyperlipidemia, non insulin dependent diabetes               mellitus, past history of smoking, coronary artery disease, prior               CVA. Other: In 3/18, fusiform AAA in the mid aorta measured 4.0 cm x 3.6 cm. Patient        denies any abdominal pain.  Limitations: Air/bowel gas and obesity.  Examination Guidelines: A complete evaluation includes B-mode imaging, spectral doppler, color doppler, and power doppler as needed of all accessible portions of each vessel. Bilateral testing is considered an integral part of a complete examination. Limited examinations for reoccurring indications may be performed as noted.  Abdominal Aorta Findings: +-------------+-------+----------+----------+--------+---------+--------+ Location     AP (cm)Trans (cm)PSV (cm/s)WaveformThrombus Comments +-------------+-------+----------+----------+--------+---------+--------+ Proximal     3.0    3.0       201               calcified         +-------------+-------+----------+----------+--------+---------+--------+ Mid          4.2    4.0       35                calcifiedfusiform  +-------------+-------+----------+----------+--------+---------+--------+ Distal       2.3    2.2       33                calcified         +-------------+-------+----------+----------+--------+---------+--------+ RT CIA Prox  1.0    1.0       84                calcified         +-------------+-------+----------+----------+--------+---------+--------+ RT EIA Distal0.9    0.9       88                calcified         +-------------+-------+----------+----------+--------+---------+--------+ LT CIA Prox  0.9  0.9       120               calcified         +-------------+-------+----------+----------+--------+---------+--------+ LT EIA Distal0.8    0.8       97                calcified         +-------------+-------+----------+----------+--------+---------+--------+  Visualization of the Proximal Abdominal Aorta, Right CIA Distal artery, Right EIA Proximal artery, Left CIA Distal artery and Left EIA Proximal artery was limited. IVC/Iliac Findings: +--------+------+--------+-------+         PatentThrombusComment +--------+------+--------+-------+ IVC Proxpatent                +--------+------+--------+-------+  Final Interpretation: Abdominal Aorta: There is evidence of abnormal dilitation of the Mid Abdominal aorta. The largest aortic measurement is 4.2 cm. The largest aortic diameter remains essentially unchanged compared to prior exam. Previous diameter measurement was 4.0 cm obtained on 3/18. Stenosis: +--------------------+-------------+ Location            Stenosis      +--------------------+-------------+ Prox Aorta          >50% stenosis +--------------------+-------------+ Right Common Iliac  <50% stenosis +--------------------+-------------+ Left Common Iliac   <50% stenosis +--------------------+-------------+ Right External Iliac<50% stenosis +--------------------+-------------+ Left External Iliac <50% stenosis  +--------------------+-------------+  *See table(s) above for measurements and observations. Suggest follow up study in 12 months.  Electronically signed by Quay Burow on 06/07/2017 at 19:49:51 AM.   Final    Assessment/Plan 1. Permanent atrial fibrillation Has a pacemaker.  Also uses metoprolol as a rate inhibitor and continues with Eliquis for stroke prevention  2. Essential hypertension Blood pressures have been well controlled currently takes losartan and metoprolol  3. Chronic systolic congestive heart failure ([place]) Seems well compensated at this time.  He does take Lasix as needed  4. Sleep apnea, unspecified type Complains of daytime sleepiness and tiredness.  He does not use CPAP and I have suggested to him that he give that a try  5. Dysphagia, unspecified type The result of the stroke.  He drinks thickened liquids  6. Idiopathic chronic gout without tophus, unspecified site No recent flares continues with allopurinol  7. Abnormality of gait No recent falls but gets around in an electric wheelchair and uses walker.   Family/ staff Communication: Findings and suggestions communicated to patient and he understands  Labs/tests ordered: none  Lillette Boxer. Sabra Heck, [place] [address] [place], [place] Office [phone]

## 2018-01-22 ENCOUNTER — Telehealth: Payer: Self-pay

## 2018-01-22 NOTE — Telephone Encounter (Signed)
Spoke with pt and reminded pt of remote transmission that is due today. Pt verbalized understanding.   

## 2018-01-23 ENCOUNTER — Other Ambulatory Visit: Payer: Self-pay

## 2018-01-25 ENCOUNTER — Encounter: Payer: Self-pay | Admitting: Cardiology

## 2018-01-26 ENCOUNTER — Ambulatory Visit (INDEPENDENT_AMBULATORY_CARE_PROVIDER_SITE_OTHER): Payer: Medicare Other

## 2018-01-26 DIAGNOSIS — Z950 Presence of cardiac pacemaker: Secondary | ICD-10-CM

## 2018-01-29 ENCOUNTER — Encounter: Payer: Self-pay | Admitting: Cardiology

## 2018-01-29 NOTE — Progress Notes (Signed)
Remote pacemaker transmission.   

## 2018-02-21 LAB — CUP PACEART REMOTE DEVICE CHECK
Battery Voltage: 2.85 V
Implantable Lead Implant Date: 20111021
Implantable Lead Location: 753859
Implantable Pulse Generator Implant Date: 20111021
Lead Channel Impedance Value: 432 Ohm
Lead Channel Sensing Intrinsic Amplitude: 2.156 mV
MDC IDC LEAD IMPLANT DT: 20111021
MDC IDC LEAD LOCATION: 753860
MDC IDC MSMT LEADCHNL RV SENSING INTR AMPL: 6.771 mV
MDC IDC SESS DTM: 20191017175446
MDC IDC SET LEADCHNL RV PACING AMPLITUDE: 2.5 V
MDC IDC SET LEADCHNL RV PACING PULSEWIDTH: 0.4 ms
MDC IDC SET LEADCHNL RV SENSING SENSITIVITY: 0.9 mV
MDC IDC STAT BRADY RV PERCENT PACED: 43.6 %

## 2018-02-22 ENCOUNTER — Encounter (INDEPENDENT_AMBULATORY_CARE_PROVIDER_SITE_OTHER): Payer: Medicare Other | Admitting: Ophthalmology

## 2018-02-22 DIAGNOSIS — H338 Other retinal detachments: Secondary | ICD-10-CM

## 2018-02-22 DIAGNOSIS — H353132 Nonexudative age-related macular degeneration, bilateral, intermediate dry stage: Secondary | ICD-10-CM

## 2018-02-22 DIAGNOSIS — H43813 Vitreous degeneration, bilateral: Secondary | ICD-10-CM

## 2018-02-22 DIAGNOSIS — H35033 Hypertensive retinopathy, bilateral: Secondary | ICD-10-CM

## 2018-02-22 DIAGNOSIS — I10 Essential (primary) hypertension: Secondary | ICD-10-CM | POA: Diagnosis not present

## 2018-03-02 ENCOUNTER — Encounter: Payer: Self-pay | Admitting: Cardiology

## 2018-03-02 ENCOUNTER — Telehealth: Payer: Self-pay

## 2018-03-02 NOTE — Telephone Encounter (Signed)
Left message for patient to remind of missed remote transmission.  

## 2018-03-23 LAB — CUP PACEART REMOTE DEVICE CHECK
Battery Voltage: 2.83 V
Brady Statistic RV Percent Paced: 52.34 %
Date Time Interrogation Session: 20191122192457
Implantable Lead Implant Date: 20111021
Implantable Lead Implant Date: 20111021
Implantable Lead Location: 753859
Implantable Lead Location: 753860
Implantable Pulse Generator Implant Date: 20111021
Lead Channel Impedance Value: 392 Ohm
Lead Channel Impedance Value: 416 Ohm
Lead Channel Sensing Intrinsic Amplitude: 2.464 mV
Lead Channel Sensing Intrinsic Amplitude: 6.433 mV
Lead Channel Setting Pacing Amplitude: 2.5 V
Lead Channel Setting Pacing Pulse Width: 0.4 ms
Lead Channel Setting Sensing Sensitivity: 0.9 mV

## 2018-04-06 LAB — CUP PACEART INCLINIC DEVICE CHECK
Date Time Interrogation Session: 20200131160325
Implantable Lead Implant Date: 20111021
Implantable Lead Implant Date: 20111021
Implantable Lead Location: 753859
MDC IDC LEAD LOCATION: 753860
MDC IDC PG IMPLANT DT: 20111021

## 2018-04-10 ENCOUNTER — Encounter: Payer: Self-pay | Admitting: Internal Medicine

## 2018-04-10 ENCOUNTER — Ambulatory Visit (INDEPENDENT_AMBULATORY_CARE_PROVIDER_SITE_OTHER): Payer: Medicare Other

## 2018-04-10 ENCOUNTER — Non-Acute Institutional Stay: Payer: Medicare Other | Admitting: Internal Medicine

## 2018-04-10 DIAGNOSIS — I5022 Chronic systolic (congestive) heart failure: Secondary | ICD-10-CM

## 2018-04-10 DIAGNOSIS — R0602 Shortness of breath: Secondary | ICD-10-CM | POA: Diagnosis not present

## 2018-04-10 DIAGNOSIS — I4821 Permanent atrial fibrillation: Secondary | ICD-10-CM | POA: Diagnosis not present

## 2018-04-10 DIAGNOSIS — R001 Bradycardia, unspecified: Secondary | ICD-10-CM

## 2018-04-10 DIAGNOSIS — I5040 Unspecified combined systolic (congestive) and diastolic (congestive) heart failure: Secondary | ICD-10-CM | POA: Diagnosis not present

## 2018-04-10 DIAGNOSIS — E039 Hypothyroidism, unspecified: Secondary | ICD-10-CM | POA: Diagnosis not present

## 2018-04-10 DIAGNOSIS — I10 Essential (primary) hypertension: Secondary | ICD-10-CM | POA: Diagnosis not present

## 2018-04-10 LAB — CUP PACEART INCLINIC DEVICE CHECK
Implantable Lead Implant Date: 20111021
Implantable Lead Implant Date: 20111021
Implantable Lead Location: 753859
Implantable Lead Location: 753860
Implantable Pulse Generator Implant Date: 20111021
MDC IDC SESS DTM: 20200204135158

## 2018-04-10 LAB — BASIC METABOLIC PANEL
BUN: 28 — AB (ref 4–21)
Creatinine: 1.2 (ref <=1.3)
Glucose: 144
Potassium: 4.3 (ref 3.4–5.3)
Sodium: 137 (ref 137–147)

## 2018-04-10 LAB — HEPATIC FUNCTION PANEL
ALT: 10 (ref 10–40)
AST: 16 (ref 14–40)
Alkaline Phosphatase: 88 (ref 25–125)
Bilirubin, Total: 0.8

## 2018-04-10 LAB — CBC AND DIFFERENTIAL
HCT: 46 (ref 41–53)
Hemoglobin: 15.5 (ref 13.5–17.5)
Platelets: 218 (ref 150–399)
WBC: 7.7

## 2018-04-10 LAB — TSH: TSH: 1.35 (ref <=5.90)

## 2018-04-10 NOTE — Progress Notes (Signed)
Location:  [facility] Room Number: [place] of Service:  ALF [phone]) Provider:    Mast, Man X, NP  Patient Care Team: Mast, Man X, NP as PCP - General (Internal Medicine) [place], Nadean Corwin, MD as Consulting Physician (Cardiology) Ardis Hughs, MD as Attending Physician (Urology) Mast, Man X, NP as Nurse Practitioner (Internal Medicine)  Extended Emergency Contact Information Primary Emergency Contact: [name] of [place] Phone: [phone] Relation: Daughter Secondary Emergency Contact: Eyvonne Mechanic [place] of [place] Phone: [phone] Mobile Phone: [phone] Relation: Daughter  Code Status:  DNR Goals of care: Advanced Directive information Advanced Directives 04/10/2018  Does Patient Have a Medical Advance Directive? Yes  Type of Advance Directive Out of facility DNR (pink MOST or yellow form);Healthcare Power of Attorney  Does patient want to make changes to medical advance directive? No - Patient declined  Copy of [place] in Chart? Yes - validated most recent copy scanned in chart (See row information)  Pre-existing out of facility DNR order (yellow form or pink MOST form) Yellow form placed in chart (order not valid for inpatient use)     Chief Complaint  Patient presents with  . Acute Visit    SOB    HPI:  Pt is a 85 y.o. male seen today for an acute visit for SOB with Cough.  Patient lives in [place]. He has h/o Diastolic CHF, Atrial fibrillation, CAD, Sick Sinus Syndrome s/p Pacemaker , H/o Stroke with Muffled speech. Dysphagia,  Patient has h/o Aspiration and is on Thickened liquids. But he doesn't follow the Instructions all the time. He does not like the Thicker. For past Few weeks he has been having SOB with Cough. No fever but c/o Chills. Also c/o DOE. He walks with the Walker. But mostly stays in his chair and is sedentary.  Past Medical  History:  Diagnosis Date  . Abnormal PFTs 10/30/2012   Followed in Pulmonary clinic/ [facility]/ [place]  - PFT's 10/22/12 VC  55% no obst, DLCO 50%  - PFT's 12/20/2012 VC 83% and no obst, dLCO 55%  -11/08/2012  Walked RA x 3 laps @ 185 ft each stopped due to  End of study, not desat -11/08/12 esr 10    . Abnormality of gait 04/18/2013  . Acute on chronic systolic congestive heart failure, NYHA class 2 ([place]) 03/16/2006   BNP 376.9 05/28/13   . Adjustment disorder with mixed anxiety and depressed mood 04/18/2013   Post CVA depression and anxiety   . Allergy   . Anxiety   . Aortic aneurysm of unspecified site without mention of rupture 10/27/2011  . Arthritis   . Atherosclerosis of renal artery ([place]) 10/18/2006  . Atrial fibrillation ([place]) 03/04/2011    He was on coumadin until his stroke last December and was switched to apixaban 2.55m daily which he has been taking faithfully without reported side effects.    . Balance disorder 07/31/2014  . Basal cell carcinoma of skin of other and unspecified parts of face 12/24/2009  . Blood transfusion without reported diagnosis   . Cardiac pacemaker in situ- MDT 10/11 03/18/2010   Medtronic revo implant in October 2011. Severe sinus bradycardia in the 30s, but not truly pacemaker dependent   . Cataract   . CKD stage 2 due to type 2 diabetes mellitus ([place] 07/03/2014  . Coronary artery disease   . CVA - Rt brain stroke 12/14 02/18/2013   02/19/13  angiography CT head:  Diffuse atherosclerotic irregularity and plaque formation of the distal right common carotid artery and proximal internal carotid artery without significant stenosis. Plaque ulceration is present and is a source of emboli. A small right thalamic CVA    . DM (diabetes mellitus), type 2 with renal complications ([place]) 0/17/4944  . Dyslipidemia 11/03/2004  . Fall 03/04/2011  . Fatigue 04/18/2013  . Gout, unspecified 11/03/2004  . Heart murmur   . History of abdominal aortic aneurysm   . History of  Doppler ultrasound 05/22/2012   LEAs; R anterior tibial artery appeared occluded; L posterior tibial shows short segment of occlusive ds  . History of Doppler ultrasound 05/22/2012   Abdominal Aortic Doppler; slight increase in fusiform aneurysm   . History of echocardiogram 12/2008   EF >55%; mild concentric LVH; mild MR; mild-mod TR; mild AV regurg;   . History of nuclear stress test 12/2010   lexiscan; low risk; compared to prior study, perfusion improved  . HTN (hypertension) 03/04/2011  . Hyperlipidemia   . Hypertension   . Hypertrophy of prostate with urinary obstruction and other lower urinary tract symptoms (LUTS) 04/28/2004  . Hyponatremia 03/04/2011  . Hypothyroidism 03/04/2011  . Impotence of organic origin 10/03/2000  . Internal hemorrhoids without mention of complication 96/75/9163  . Long term (current) use of anticoagulants 03/17/2011  . Major depressive disorder, single episode, unspecified 03/05/2009  . Memory change 01/24/2013  . Muscle weakness (generalized) 03/10/2011  . Nocturia 10/27/2011  . Obstructive sleep apnea (adult) (pediatric) 07/23/2008  . Open wound of knee, leg (except thigh), and ankle, complicated 8/46/6599  . Osteoarthritis of both knees 07/31/2014  . Osteoarthrosis, unspecified whether generalized or localized, unspecified site 02/05/2003  . PAF (paroxysmal atrial fibrillation) (HCC)    coumadin  . Pain in joint, lower leg 08/04/2011  . Pruritic condition 01/24/2013  . Quadriceps weakness 07/31/2014  . Right bundle branch block 04/19/2006  . 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 Spinal stenosis in cervical region 12/15/2004  . SSS (sick sinus syndrome) ([place]) 04/08/2014  . Stroke ([place])   . Thyroid disease   . TIA (transient ischemic attack) 04/30/2013  . Type II or unspecified type diabetes mellitus without mention of complication, not stated as uncontrolled 11/24/2004  . Unspecified vitamin D deficiency  10/18/2006  . 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 Increased exposure of the right cornea.    Past Surgical History:  Procedure Laterality Date  . CORONARY ANGIOPLASTY WITH STENT PLACEMENT  2008   stent to SVG to OM  . CORONARY ARTERY BYPASS GRAFT  1989  . EYE SURGERY    . HERNIA REPAIR    . INSERT / REPLACE / REMOVE PACEMAKER    . JOINT REPLACEMENT    . PACEMAKER INSERTION  2011    Allergies  Allergen Reactions  . Quinolones Other (See Comments)    Risk of rupture of AAA  . Caduet [Amlodipine-Atorvastatin] Other (See Comments)    Weakness   . Crestor [Rosuvastatin] Other (See Comments)    Muscle pain/weakness  . Lipitor [Atorvastatin Calcium] Other (See Comments)    Muscle pain/weakness  . Simvastatin Other (See Comments)    Muscle pain/weakness  . Norvasc [Amlodipine]   . Lisinopril Other (See Comments)    Doesn't remember reaction    Outpatient Encounter Medications as of 04/10/2018  Medication Sig  . allopurinol (ZYLOPRIM) 100 MG tablet Take 50 mg by  mouth daily.   Marland Kitchen ALPRAZolam (XANAX) 0.25 MG tablet Take 0.25 mg by mouth every 6 (six) hours as needed for anxiety.  Marland Kitchen amoxicillin (AMOXIL) 500 MG capsule Take 500 mg by mouth. Take 4 caps prior to dental procedures  . apixaban (ELIQUIS) 2.5 MG TABS tablet Take 1 tablet (2.5 mg total) by mouth 2 (two) times daily.  . calcium carbonate (TUMS - DOSED IN MG ELEMENTAL CALCIUM) 500 MG chewable tablet Chew 1 tablet by mouth as needed for indigestion or heartburn.  . Carboxymethylcellulose Sodium (THERATEARS OP) Place 1 drop into both eyes at bedtime as needed.  . Cholecalciferol 1000 units tablet Take 2,000 Units by mouth daily.  . furosemide (LASIX) 20 MG tablet Take 60 mg by mouth daily.   . furosemide (LASIX) 20 MG tablet Take 20 mg by mouth as needed (weight gain > 3 lbs in 24 hours or >5 lbs in a week).  Marland Kitchen ipratropium-albuterol (DUONEB) 0.5-2.5 (3) MG/3ML SOLN Take 3 mLs by nebulization 4 (four) times  daily as needed.  Marland Kitchen levothyroxine (SYNTHROID, LEVOTHROID) 125 MCG tablet Take 125 mcg by mouth daily before breakfast.  . Light Mineral Oil-Mineral Oil (RETAINE MGD OP) Place 1 drop into both eyes at bedtime as needed.  Marland Kitchen losartan (COZAAR) 25 MG tablet Take 1 tablet (25 mg total) by mouth daily.  . meclizine (ANTIVERT) 25 MG tablet Take 25 mg by mouth every 6 (six) hours as needed.   . Menthol, Topical Analgesic, (BIOFREEZE) 4 % GEL Apply topically. Apply to left knee four times daily as needed for pain  . metoprolol tartrate (LOPRESSOR) 25 MG tablet Take 1 tablet (25 mg total) by mouth 2 (two) times daily.  . nitroGLYCERIN (NITROSTAT) 0.4 MG SL tablet Place 0.4 mg under the tongue every 5 (five) minutes as needed for chest pain.   . polyethylene glycol (MIRALAX / GLYCOLAX) packet Take 17 g by mouth daily as needed.   . triamcinolone cream (KENALOG) 0.1 % Apply 1 application topically daily.  . [DISCONTINUED] ipratropium (ATROVENT) 0.06 % nasal spray Place 1 spray into both nostrils 3 (three) times daily as needed for rhinitis.  . [DISCONTINUED] Neomycin-Bacitracin-Polymyxin (TRIPLE ANTIBIOTIC) 3.5-680-500-6000 OINT Apply 1 application topically daily as needed (apply to bilateral nostrils).   No facility-administered encounter medications on file as of 04/10/2018.     Review of Systems  Constitutional: Positive for activity change.  HENT: Negative.   Respiratory: Positive for cough and shortness of breath.   Cardiovascular: Negative for chest pain and leg swelling.  Gastrointestinal: Negative.   Genitourinary: Negative.   Musculoskeletal: Negative.   Skin: Negative.   Neurological: Positive for weakness.  Psychiatric/Behavioral: Negative.     Immunization History  Administered Date(s) Administered  . Influenza Whole 12/06/2011, 12/19/2012, 12/07/2017  . Influenza-Unspecified 01/09/2014, 11/25/2014, 12/23/2015, 12/26/2016  . PPD Test 05/03/2013  . Pneumococcal Conjugate-13 01/03/2017  .  Pneumococcal Polysaccharide-23 03/07/1986  . Td 04/20/2005  . Tdap 01/03/2017   Pertinent  Health Maintenance Due  Topic Date Due  . OPHTHALMOLOGY EXAM  01/12/1932  . FOOT EXAM  07/31/2015  . HEMOGLOBIN A1C  10/23/2017  . PNA vac Low Risk Adult (2 of 2 - PPSV23) 01/03/2018  . INFLUENZA VACCINE  Completed   Fall Risk  01/23/2018 11/21/2017 11/15/2016 10/15/2015 06/11/2015  Falls in the past year? 0 No No No Yes  Comment Emmi Telephone Survey: data to providers prior to load - - - -  Number falls in past yr: - - - - 1  Injury with Fall? - - - - Yes  Risk for fall due to : - - - - -  Follow up - - - - -   Functional Status Survey:    Vitals:   04/10/18 0944  BP: 122/60  Pulse: 78  Resp: 20  Temp: 97.9 F (36.6 C)  Weight: 158 lb (71.7 kg)   Body mass index is 24.02 kg/m. Physical Exam Vitals signs reviewed.  Constitutional:      Appearance: Normal appearance.  HENT:     Head: Normocephalic.     Nose: Nose normal.     Mouth/Throat:     Mouth: Mucous membranes are moist.     Pharynx: Oropharynx is clear.  Eyes:     Pupils: Pupils are equal, round, and reactive to light.  Neck:     Musculoskeletal: Neck supple.  Cardiovascular:     Rate and Rhythm: Normal rate and regular rhythm.     Pulses: Normal pulses.     Heart sounds: Normal heart sounds.  Pulmonary:     Effort: Pulmonary effort is normal.     Comments: Patient has Crackles Bilateral at bases Abdominal:     General: Abdomen is flat. Bowel sounds are normal. There is no distension.     Palpations: Abdomen is soft.     Tenderness: There is no abdominal tenderness. There is no guarding.  Musculoskeletal:        General: No swelling.  Skin:    General: Skin is warm and dry.  Neurological:     General: No focal deficit present.     Mental Status: He is alert and oriented to person, place, and time.  Psychiatric:        Mood and Affect: Mood normal.        Thought Content: Thought content normal.     Labs  reviewed: Recent Labs    07/20/17 10/24/17  NA 138 137  K 4.1 4.6  CL 102 100  CO2 29 28  BUN 28* 28*  CREATININE 1.2 1.1  CALCIUM 9.3 9.3   Recent Labs    07/20/17  AST 16  ALT 11  ALKPHOS 84  ALBUMIN 3.9   Recent Labs    07/20/17  WBC 6.2  HGB 15.3  HCT 45  PLT 212   Lab Results  Component Value Date   TSH 1.22 10/24/2017   Lab Results  Component Value Date   HGBA1C 6.9 04/25/2017   Lab Results  Component Value Date   CHOL 170 07/20/2017   HDL 39 07/20/2017   LDLCALC 110 07/20/2017   TRIG 107 07/20/2017   CHOLHDL 3.9 05/01/2013    Significant Diagnostic Results in last 30 days:  No results found.  Assessment/Plan SOB with Cough and Chills Will get Chest Xray to rule out ? CHF/ Aspiration Weight is stable Patient is on Lasix 60 mg QD Will Check Bmp and CBC TSH Check POX on exertion  H/o Atrial Fibrillation Rate control on Lopressor Also on Eliquis Anxiety  Patient does not take Xanax. Will decrease the Prn dose H/O Stroke On Eliquis Not on Statin . Not tolerated it BP controlled Family/ staff Communication:   Labs/tests ordered:  Total time spent in this patient care encounter was 45_ minutes; greater than 50% of the visit spent counseling patient, reviewing records , Labs and coordinating care for problems addressed at this encounter.

## 2018-04-11 ENCOUNTER — Encounter: Payer: Self-pay | Admitting: Nurse Practitioner

## 2018-04-11 ENCOUNTER — Non-Acute Institutional Stay: Payer: Medicare Other | Admitting: Nurse Practitioner

## 2018-04-11 DIAGNOSIS — I5022 Chronic systolic (congestive) heart failure: Secondary | ICD-10-CM

## 2018-04-11 DIAGNOSIS — J690 Pneumonitis due to inhalation of food and vomit: Secondary | ICD-10-CM | POA: Diagnosis not present

## 2018-04-11 DIAGNOSIS — R1310 Dysphagia, unspecified: Secondary | ICD-10-CM

## 2018-04-11 LAB — COMPLETE METABOLIC PANEL WITH GFR
Albumin: 3.8
Calcium: 9.7
Carbon Dioxide, Total: 26
Chloride: 101
EGFR (Non-African Amer.): 49
Globulin: 2.5
Total Protein: 6.3 g/dL

## 2018-04-11 NOTE — Progress Notes (Signed)
Location:  [place] Room Number: [place] of Service:  ALF [phone]) Provider:  Marlana Latus  NP  Tashanda Fuhrer X, NP  Patient Care Team: Sherika Kubicki X, NP as PCP - General (Internal Medicine) [place], Nadean Corwin, MD as Consulting Physician (Cardiology) Ardis Hughs, MD as Attending Physician (Urology) Pinchos Topel X, NP as Nurse Practitioner (Internal Medicine)  Extended Emergency Contact Information Primary Emergency Contact: [name] of [place] Phone: [phone] Relation: Daughter Secondary Emergency Contact: Eyvonne Mechanic [place] of [place] Phone: [phone] Mobile Phone: [phone] Relation: Daughter  Code Status:  DNR Goals of care: Advanced Directive information Advanced Directives 04/10/2018  Does Patient Have a Medical Advance Directive? Yes  Type of Advance Directive Out of facility DNR (pink MOST or yellow form);Healthcare Power of Attorney  Does patient want to make changes to medical advance directive? No - Patient declined  Copy of [place] in Chart? Yes - validated most recent copy scanned in chart (See row information)  Pre-existing out of facility DNR order (yellow form or pink MOST form) Yellow form placed in chart (order not valid for inpatient use)     Chief Complaint  Patient presents with  . Acute Visit    HPI:  Pt is a 85 y.o. male seen today for an acute visit for aspiration pneumonia and CHF. CXR 2//4/20 showed a mild pulmonary infiltrate in the right lung base and small right pleural effusion consistent with congestive heart failure vs pneumonia. Augmentin '875mg'$  bid x 7 days stared subsequently. He is afebrile, no O2 desaturation. He denied chest pain, pressure, palpitation, or increased SOB. Hx of dysphagia. On Furosemide '60mg'$  qd, '20mg'$  prn. 04/10/18 wbc 7.7, Hgb 15.5, plt 218, neutrophil 63.7, Na 137, K 4.3, Bun 28, creat 1.24, TSH  1.39   Past Medical History:  Diagnosis Date  . Abnormal PFTs 10/30/2012   Followed in Pulmonary clinic/ [facility]/ [place]  - PFT's 10/22/12 VC  55% no obst, DLCO 50%  - PFT's 12/20/2012 VC 83% and no obst, dLCO 55%  -11/08/2012  Walked RA x 3 laps @ 185 ft each stopped due to  End of study, not desat -11/08/12 esr 10    . Abnormality of gait 04/18/2013  . Acute on chronic systolic congestive heart failure, NYHA class 2 ([place]) 03/16/2006   BNP 376.9 05/28/13   . Adjustment disorder with mixed anxiety and depressed mood 04/18/2013   Post CVA depression and anxiety   . Allergy   . Anxiety   . Aortic aneurysm of unspecified site without mention of rupture 10/27/2011  . Arthritis   . Atherosclerosis of renal artery ([place]) 10/18/2006  . Atrial fibrillation ([place]) 03/04/2011    He was on coumadin until his stroke last December and was switched to apixaban 2.'5mg'$  daily which he has been taking faithfully without reported side effects.    . Balance disorder 07/31/2014  . Basal cell carcinoma of skin of other and unspecified parts of face 12/24/2009  . Blood transfusion without reported diagnosis   . Cardiac pacemaker in situ- MDT 10/11 03/18/2010   Medtronic revo implant in October 2011. Severe sinus bradycardia in the 30s, but not truly pacemaker dependent   . Cataract   . CKD stage 2 due to type 2 diabetes mellitus ([place]) 07/03/2014  . Coronary artery disease   . CVA - Rt brain stroke 12/14 02/18/2013   02/19/13 angiography CT head:  Diffuse atherosclerotic irregularity  and plaque formation of the distal right common carotid artery and proximal internal carotid artery without significant stenosis. Plaque ulceration is present and is a source of emboli. A small right thalamic CVA    . DM (diabetes mellitus), type 2 with renal complications ([place]) 2/40/9735  . Dyslipidemia 11/03/2004  . Fall 03/04/2011  . Fatigue 04/18/2013  . Gout, unspecified 11/03/2004  . Heart murmur   . History of abdominal aortic  aneurysm   . History of Doppler ultrasound 05/22/2012   LEAs; R anterior tibial artery appeared occluded; L posterior tibial shows short segment of occlusive ds  . History of Doppler ultrasound 05/22/2012   Abdominal Aortic Doppler; slight increase in fusiform aneurysm   . History of echocardiogram 12/2008   EF >55%; mild concentric LVH; mild MR; mild-mod TR; mild AV regurg;   . History of nuclear stress test 12/2010   lexiscan; low risk; compared to prior study, perfusion improved  . HTN (hypertension) 03/04/2011  . Hyperlipidemia   . Hypertension   . Hypertrophy of prostate with urinary obstruction and other lower urinary tract symptoms (LUTS) 04/28/2004  . Hyponatremia 03/04/2011  . Hypothyroidism 03/04/2011  . Impotence of organic origin 10/03/2000  . Internal hemorrhoids without mention of complication 32/99/2426  . Long term (current) use of anticoagulants 03/17/2011  . Major depressive disorder, single episode, unspecified 03/05/2009  . Memory change 01/24/2013  . Muscle weakness (generalized) 03/10/2011  . Nocturia 10/27/2011  . Obstructive sleep apnea (adult) (pediatric) 07/23/2008  . Open wound of knee, leg (except thigh), and ankle, complicated 8/34/1962  . Osteoarthritis of both knees 07/31/2014  . Osteoarthrosis, unspecified whether generalized or localized, unspecified site 02/05/2003  . PAF (paroxysmal atrial fibrillation) (HCC)    coumadin  . Pain in joint, lower leg 08/04/2011  . Pruritic condition 01/24/2013  . Quadriceps weakness 07/31/2014  . Right bundle branch block 04/19/2006  . 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 Spinal stenosis in cervical region 12/15/2004  . SSS (sick sinus syndrome) ([place]) 04/08/2014  . Stroke ([place])   . Thyroid disease   . TIA (transient ischemic attack) 04/30/2013  . Type II or unspecified type diabetes mellitus without mention of complication, not stated as uncontrolled 11/24/2004  . Unspecified  vitamin D deficiency 10/18/2006  . 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 Increased exposure of the right cornea.    Past Surgical History:  Procedure Laterality Date  . CORONARY ANGIOPLASTY WITH STENT PLACEMENT  2008   stent to SVG to OM  . CORONARY ARTERY BYPASS GRAFT  1989  . EYE SURGERY    . HERNIA REPAIR    . INSERT / REPLACE / REMOVE PACEMAKER    . JOINT REPLACEMENT    . PACEMAKER INSERTION  2011    Allergies  Allergen Reactions  . Quinolones Other (See Comments)    Risk of rupture of AAA  . Caduet [Amlodipine-Atorvastatin] Other (See Comments)    Weakness   . Crestor [Rosuvastatin] Other (See Comments)    Muscle pain/weakness  . Lipitor [Atorvastatin Calcium] Other (See Comments)    Muscle pain/weakness  . Simvastatin Other (See Comments)    Muscle pain/weakness  . Norvasc [Amlodipine]   . Lisinopril Other (See Comments)    Doesn't remember reaction    Outpatient Encounter Medications as of 04/11/2018  Medication Sig  . allopurinol (ZYLOPRIM) 100 MG tablet Take 50 mg by mouth daily.   Marland Kitchen ALPRAZolam Duanne Moron)  0.25 MG tablet Take 0.25 mg by mouth 2 (two) times daily as needed for anxiety.   Marland Kitchen amoxicillin (AMOXIL) 500 MG capsule Take 500 mg by mouth. Take 4 caps prior to dental procedures  . amoxicillin-clavulanate (AUGMENTIN) 875-125 MG tablet Take 1 tablet by mouth 2 (two) times daily.  Marland Kitchen apixaban (ELIQUIS) 2.5 MG TABS tablet Take 1 tablet (2.5 mg total) by mouth 2 (two) times daily.  . calcium carbonate (TUMS - DOSED IN MG ELEMENTAL CALCIUM) 500 MG chewable tablet Chew 1 tablet by mouth as needed for indigestion or heartburn.  . Carboxymethylcellulose Sodium (THERATEARS OP) Place 1 drop into both eyes at bedtime as needed.  . Cholecalciferol 1000 units tablet Take 2,000 Units by mouth daily.  . furosemide (LASIX) 20 MG tablet Take 60 mg by mouth daily.   . furosemide (LASIX) 20 MG tablet Take 20 mg by mouth as needed (weight gain > 3 lbs in 24 hours  or >5 lbs in a week).  Marland Kitchen ipratropium (ATROVENT) 0.06 % nasal spray Place 1 spray into both nostrils 3 (three) times daily as needed for rhinitis.  Marland Kitchen ipratropium-albuterol (DUONEB) 0.5-2.5 (3) MG/3ML SOLN Take 3 mLs by nebulization 4 (four) times daily as needed.  Marland Kitchen levothyroxine (SYNTHROID, LEVOTHROID) 125 MCG tablet Take 125 mcg by mouth daily before breakfast.  . Light Mineral Oil-Mineral Oil (RETAINE MGD OP) Place 1 drop into both eyes at bedtime as needed.  Marland Kitchen losartan (COZAAR) 25 MG tablet Take 1 tablet (25 mg total) by mouth daily.  . meclizine (ANTIVERT) 25 MG tablet Take 25 mg by mouth every 6 (six) hours as needed.   . Menthol, Topical Analgesic, (BIOFREEZE) 4 % GEL Apply topically. Apply to left knee four times daily as needed for pain  . metoprolol tartrate (LOPRESSOR) 25 MG tablet Take 1 tablet (25 mg total) by mouth 2 (two) times daily.  . nitroGLYCERIN (NITROSTAT) 0.4 MG SL tablet Place 0.4 mg under the tongue every 5 (five) minutes as needed for chest pain.   . polyethylene glycol (MIRALAX / GLYCOLAX) packet Take 17 g by mouth daily as needed.   . saccharomyces boulardii (FLORASTOR) 250 MG capsule Take 250 mg by mouth 2 (two) times daily.  Marland Kitchen tobramycin-dexamethasone (TOBRADEX) ophthalmic ointment Place 1 application into both eyes 2 (two) times daily.  Marland Kitchen triamcinolone cream (KENALOG) 0.1 % Apply 1 application topically daily.   No facility-administered encounter medications on file as of 04/11/2018.     Review of Systems  Constitutional: Positive for fatigue. Negative for activity change, appetite change, chills, diaphoresis and fever.  HENT: Positive for hearing loss and trouble swallowing. Negative for congestion, rhinorrhea, sinus pressure, sinus pain, sore throat and voice change.   Respiratory: Positive for cough and shortness of breath. Negative for wheezing.   Cardiovascular: Negative for chest pain, palpitations and leg swelling.  Gastrointestinal: Negative for abdominal  distention, abdominal pain, constipation, diarrhea, nausea and vomiting.  Genitourinary: Negative for difficulty urinating, dysuria and urgency.  Musculoskeletal: Positive for gait problem.  Skin: Negative for color change and pallor.  Neurological: Negative for dizziness, speech difficulty, weakness and headaches.       Memory lapses.   Psychiatric/Behavioral: Negative for agitation, behavioral problems, hallucinations and sleep disturbance. The patient is not nervous/anxious.     Immunization History  Administered Date(s) Administered  . Influenza Whole 12/06/2011, 12/19/2012, 12/07/2017  . Influenza-Unspecified 01/09/2014, 11/25/2014, 12/23/2015, 12/26/2016  . PPD Test 05/03/2013  . Pneumococcal Conjugate-13 01/03/2017  . Pneumococcal Polysaccharide-23 03/07/1986  .  Td 04/20/2005  . Tdap 01/03/2017   Pertinent  Health Maintenance Due  Topic Date Due  . OPHTHALMOLOGY EXAM  01/12/1932  . FOOT EXAM  07/31/2015  . HEMOGLOBIN A1C  10/23/2017  . PNA vac Low Risk Adult (2 of 2 - PPSV23) 01/03/2018  . INFLUENZA VACCINE  Completed   Fall Risk  01/23/2018 11/21/2017 11/15/2016 10/15/2015 06/11/2015  Falls in the past year? 0 No No No Yes  Comment Emmi Telephone Survey: data to providers prior to load - - - -  Number falls in past yr: - - - - 1  Injury with Fall? - - - - Yes  Risk for fall due to : - - - - -  Follow up - - - - -   Functional Status Survey:    Vitals:   04/11/18 1154  BP: 118/68  Pulse: 62  Resp: 20  Temp: 97.9 F (36.6 C)  SpO2: 92%  Weight: 158 lb (71.7 kg)  Height: '5\' 8"'$  (1.727 m)   Body mass index is 24.02 kg/m. Physical Exam Constitutional:      General: He is not in acute distress.    Appearance: Normal appearance. He is not ill-appearing, toxic-appearing or diaphoretic.  HENT:     Head: Normocephalic and atraumatic.     Nose: Nose normal. No congestion or rhinorrhea.     Mouth/Throat:     Mouth: Mucous membranes are moist.  Eyes:     Extraocular  Movements: Extraocular movements intact.     Pupils: Pupils are equal, round, and reactive to light.  Neck:     Musculoskeletal: Normal range of motion.  Cardiovascular:     Rate and Rhythm: Normal rate and regular rhythm.     Heart sounds: Murmur present.  Pulmonary:     Effort: Pulmonary effort is normal. No respiratory distress.     Breath sounds: Rales present. No wheezing or rhonchi.     Comments: Bibasilar crackling.  Abdominal:     Palpations: Abdomen is soft.     Tenderness: There is no abdominal tenderness. There is no guarding or rebound.  Musculoskeletal:     Right lower leg: No edema.     Left lower leg: No edema.     Comments: Ambulates with walker.   Skin:    General: Skin is warm and dry.  Neurological:     General: No focal deficit present.     Mental Status: He is alert. Mental status is at baseline.     Motor: No weakness.     Coordination: Coordination normal.     Gait: Gait abnormal.     Comments: Oriented to person and place.   Psychiatric:        Mood and Affect: Mood normal.        Behavior: Behavior normal.        Thought Content: Thought content normal.        Judgment: Judgment normal.     Labs reviewed: Recent Labs    07/20/17 10/24/17 04/10/18  NA 138 137 137  K 4.1 4.6 4.3  CL 102 100 101  CO2 '29 28 26  '$ BUN 28* 28* 28*  CREATININE 1.2 1.1 1.2  CALCIUM 9.3 9.3 9.7   Recent Labs    07/20/17 04/10/18  AST 16 16  ALT 11 10  ALKPHOS 84 88  PROT  --  6.3  ALBUMIN 3.9 3.8   Recent Labs    07/20/17 04/10/18  WBC 6.2 7.7  HGB 15.3  15.5  HCT 45 46  PLT 212 218   Lab Results  Component Value Date   TSH 1.35 04/10/2018   Lab Results  Component Value Date   HGBA1C 6.9 04/25/2017   Lab Results  Component Value Date   CHOL 170 07/20/2017   HDL 39 07/20/2017   LDLCALC 110 07/20/2017   TRIG 107 07/20/2017   CHOLHDL 3.9 05/01/2013    Significant Diagnostic Results in last 30 days:  No results  found.  Assessment/Plan Aspiration pneumonia (HCC) Hx of aspiration pneumonia, will complete 7 day course of Augmentin '875mg'$  bid. Will have ST to eval and recommendation for dysphagia.   Chronic systolic congestive heart failure ([place]) CXR 2//4/20 showed a mild pulmonary infiltrate in the right lung base and small right pleural effusion consistent with congestive heart failure vs pneumonia. Dr. Lyndel Safe advised to continue Furosemide '60mg'$  qd, adding Furosemide '20mg'$  qd x 1 week, observe, update BMP in one week.   Dysphagia Will have ST to eval and recommendation    Family/ staff Communication: plan of care reviewed with the patient and charge nurse.   Labs/tests ordered:  BMP one week.   Time spend 25 minutes.

## 2018-04-11 NOTE — Assessment & Plan Note (Signed)
Will have ST to eval and recommendation

## 2018-04-11 NOTE — Assessment & Plan Note (Signed)
Hx of aspiration pneumonia, will complete 7 day course of Augmentin 875mg  bid. Will have ST to eval and recommendation for dysphagia.

## 2018-04-11 NOTE — Assessment & Plan Note (Addendum)
CXR 2//4/20 showed a mild pulmonary infiltrate in the right lung base and small right pleural effusion consistent with congestive heart failure vs pneumonia. Dr. Lyndel Safe advised to continue Furosemide 60mg  qd, adding Furosemide 20mg  qd x 1 week, observe, update BMP in one week.

## 2018-04-12 ENCOUNTER — Other Ambulatory Visit: Payer: Self-pay | Admitting: *Deleted

## 2018-04-12 DIAGNOSIS — F4323 Adjustment disorder with mixed anxiety and depressed mood: Secondary | ICD-10-CM | POA: Diagnosis not present

## 2018-04-12 LAB — CUP PACEART REMOTE DEVICE CHECK
Battery Voltage: 2.82 V
Brady Statistic RV Percent Paced: 46.82 %
Date Time Interrogation Session: 20200204200348
Implantable Lead Implant Date: 20111021
Implantable Lead Implant Date: 20111021
Implantable Lead Location: 753859
Implantable Lead Location: 753860
Lead Channel Impedance Value: 432 Ohm
Lead Channel Sensing Intrinsic Amplitude: 2.464 mV
Lead Channel Sensing Intrinsic Amplitude: 6.433 mV
Lead Channel Setting Pacing Pulse Width: 0.4 ms
Lead Channel Setting Sensing Sensitivity: 0.9 mV
MDC IDC PG IMPLANT DT: 20111021
MDC IDC SET LEADCHNL RV PACING AMPLITUDE: 2.5 V

## 2018-04-12 LAB — BRAIN NATRIURETIC PEPTIDE: B Natriuretic Peptide: 203

## 2018-04-17 DIAGNOSIS — I5022 Chronic systolic (congestive) heart failure: Secondary | ICD-10-CM | POA: Diagnosis not present

## 2018-04-17 LAB — BASIC METABOLIC PANEL
BUN: 33 — AB (ref 4–21)
Creatinine: 1.1 (ref <=1.3)
Glucose: 124
POTASSIUM: 4.1 (ref 3.4–5.3)
Sodium: 139 (ref 137–147)

## 2018-04-18 DIAGNOSIS — I259 Chronic ischemic heart disease, unspecified: Secondary | ICD-10-CM | POA: Diagnosis not present

## 2018-04-18 DIAGNOSIS — F419 Anxiety disorder, unspecified: Secondary | ICD-10-CM | POA: Diagnosis not present

## 2018-04-18 DIAGNOSIS — I10 Essential (primary) hypertension: Secondary | ICD-10-CM | POA: Diagnosis not present

## 2018-04-18 DIAGNOSIS — E039 Hypothyroidism, unspecified: Secondary | ICD-10-CM | POA: Diagnosis not present

## 2018-04-18 DIAGNOSIS — I69954 Hemiplegia and hemiparesis following unspecified cerebrovascular disease affecting left non-dominant side: Secondary | ICD-10-CM | POA: Diagnosis not present

## 2018-04-18 DIAGNOSIS — E559 Vitamin D deficiency, unspecified: Secondary | ICD-10-CM | POA: Diagnosis not present

## 2018-04-18 DIAGNOSIS — I4891 Unspecified atrial fibrillation: Secondary | ICD-10-CM | POA: Diagnosis not present

## 2018-04-18 DIAGNOSIS — R471 Dysarthria and anarthria: Secondary | ICD-10-CM | POA: Diagnosis not present

## 2018-04-18 DIAGNOSIS — R1312 Dysphagia, oropharyngeal phase: Secondary | ICD-10-CM | POA: Diagnosis not present

## 2018-04-19 NOTE — Progress Notes (Signed)
Remote pacemaker transmission.   

## 2018-04-20 ENCOUNTER — Other Ambulatory Visit: Payer: Self-pay | Admitting: *Deleted

## 2018-04-20 LAB — BASIC METABOLIC PANEL
Calcium: 9.4
Carbon Dioxide, Total: 26
Chloride: 100
EGFR (Non-African Amer.): 58

## 2018-04-20 MED ORDER — ALPRAZOLAM 0.25 MG PO TABS: 0.2500 mg | ORAL_TABLET | Freq: Two times a day (BID) | ORAL | 0 refills | Status: AC | PRN

## 2018-04-21 ENCOUNTER — Emergency Department (HOSPITAL_COMMUNITY): Payer: Medicare Other

## 2018-04-21 ENCOUNTER — Other Ambulatory Visit: Payer: Self-pay

## 2018-04-21 ENCOUNTER — Emergency Department (HOSPITAL_COMMUNITY)
Admission: EM | Admit: 2018-04-21 | Discharge: 2018-04-21 | Disposition: A | Discharge status: Home / Self Care | Payer: Medicare Other | Attending: Emergency Medicine | Admitting: Emergency Medicine

## 2018-04-21 ENCOUNTER — Encounter (HOSPITAL_COMMUNITY): Payer: Self-pay

## 2018-04-21 DIAGNOSIS — R5383 Other fatigue: Secondary | ICD-10-CM | POA: Insufficient documentation

## 2018-04-21 DIAGNOSIS — Z79899 Other long term (current) drug therapy: Secondary | ICD-10-CM | POA: Insufficient documentation

## 2018-04-21 DIAGNOSIS — I130 Hypertensive heart and chronic kidney disease with heart failure and stage 1 through stage 4 chronic kidney disease, or unspecified chronic kidney disease: Secondary | ICD-10-CM | POA: Insufficient documentation

## 2018-04-21 DIAGNOSIS — I5022 Chronic systolic (congestive) heart failure: Secondary | ICD-10-CM | POA: Insufficient documentation

## 2018-04-21 DIAGNOSIS — E119 Type 2 diabetes mellitus without complications: Secondary | ICD-10-CM | POA: Insufficient documentation

## 2018-04-21 DIAGNOSIS — K862 Cyst of pancreas: Secondary | ICD-10-CM | POA: Diagnosis not present

## 2018-04-21 DIAGNOSIS — R1084 Generalized abdominal pain: Secondary | ICD-10-CM | POA: Diagnosis not present

## 2018-04-21 DIAGNOSIS — E039 Hypothyroidism, unspecified: Secondary | ICD-10-CM | POA: Diagnosis not present

## 2018-04-21 DIAGNOSIS — R1031 Right lower quadrant pain: Secondary | ICD-10-CM

## 2018-04-21 DIAGNOSIS — Z87891 Personal history of nicotine dependence: Secondary | ICD-10-CM | POA: Diagnosis not present

## 2018-04-21 DIAGNOSIS — N182 Chronic kidney disease, stage 2 (mild): Secondary | ICD-10-CM | POA: Diagnosis not present

## 2018-04-21 DIAGNOSIS — R52 Pain, unspecified: Secondary | ICD-10-CM | POA: Diagnosis not present

## 2018-04-21 DIAGNOSIS — R5381 Other malaise: Secondary | ICD-10-CM

## 2018-04-21 LAB — TROPONIN I
Troponin I: 0.07 ng/mL (ref <0.03)
Troponin I: 0.06 ng/mL (ref <0.03)

## 2018-04-21 LAB — COMPREHENSIVE METABOLIC PANEL
ALT: 14 U/L (ref 0–44)
AST: 20 U/L (ref 15–41)
Albumin: 3.6 g/dL (ref 3.5–5.0)
Alkaline Phosphatase: 83 U/L (ref 38–126)
Anion gap: 11 (ref 5–15)
BUN: 29 mg/dL — AB (ref 8–23)
CO2: 25 mmol/L (ref 22–32)
Calcium: 9.5 mg/dL (ref 8.9–10.3)
Chloride: 100 mmol/L (ref 98–111)
Creatinine, Ser: 1.26 mg/dL — ABNORMAL HIGH (ref 0.61–1.24)
GFR calc Af Amer: 56 mL/min — ABNORMAL LOW (ref >60)
GFR calc non Af Amer: 49 mL/min — ABNORMAL LOW (ref >60)
Glucose, Bld: 128 mg/dL — ABNORMAL HIGH (ref 70–99)
POTASSIUM: 3.8 mmol/L (ref 3.5–5.1)
Sodium: 136 mmol/L (ref 135–145)
Total Bilirubin: 1 mg/dL (ref 0.3–1.2)
Total Protein: 6.7 g/dL (ref 6.5–8.1)

## 2018-04-21 LAB — URINALYSIS, ROUTINE W REFLEX MICROSCOPIC
Bilirubin Urine: NEGATIVE
Glucose, UA: NEGATIVE mg/dL
Hgb urine dipstick: NEGATIVE
Ketones, ur: NEGATIVE mg/dL
Leukocytes,Ua: NEGATIVE
Nitrite: NEGATIVE
Protein, ur: NEGATIVE mg/dL
Specific Gravity, Urine: 1.04 — ABNORMAL HIGH (ref 1.005–1.030)
pH: 6 (ref 5.0–8.0)

## 2018-04-21 LAB — CBC WITH DIFFERENTIAL/PLATELET
Abs Immature Granulocytes: 0.02 10*3/uL (ref 0.00–0.07)
Basophils Absolute: 0.1 10*3/uL (ref 0.0–0.1)
Basophils Relative: 1 %
EOS ABS: 0.1 10*3/uL (ref 0.0–0.5)
Eosinophils Relative: 1 %
HCT: 47.9 % (ref 39.0–52.0)
Hemoglobin: 15.3 g/dL (ref 13.0–17.0)
Immature Granulocytes: 0 %
Lymphocytes Relative: 16 %
Lymphs Abs: 1.5 10*3/uL (ref 0.7–4.0)
MCH: 30.4 pg (ref 26.0–34.0)
MCHC: 31.9 g/dL (ref 30.0–36.0)
MCV: 95.2 fL (ref 80.0–100.0)
Monocytes Absolute: 1 10*3/uL (ref 0.1–1.0)
Monocytes Relative: 10 %
Neutro Abs: 6.9 10*3/uL (ref 1.7–7.7)
Neutrophils Relative %: 72 %
Platelets: 184 10*3/uL (ref 150–400)
RBC: 5.03 MIL/uL (ref 4.22–5.81)
RDW: 13.9 % (ref 11.5–15.5)
WBC: 9.6 10*3/uL (ref 4.0–10.5)
nRBC: 0 % (ref 0.0–0.2)

## 2018-04-21 LAB — LIPASE, BLOOD: Lipase: 69 U/L — ABNORMAL HIGH (ref 11–51)

## 2018-04-21 LAB — BRAIN NATRIURETIC PEPTIDE: B Natriuretic Peptide: 228.3 pg/mL — ABNORMAL HIGH (ref 0.0–100.0)

## 2018-04-21 LAB — LACTIC ACID, PLASMA: Lactic Acid, Venous: 1.5 mmol/L (ref 0.5–1.9)

## 2018-04-21 MED ORDER — ALPRAZOLAM 0.25 MG PO TABS
0.2500 mg | ORAL_TABLET | Freq: Once | ORAL | Status: AC
  Administered 2018-04-21: 0.25 mg via ORAL
  Filled 2018-04-21: qty 1

## 2018-04-21 MED ORDER — IOHEXOL 300 MG/ML  SOLN
100.0000 mL | Freq: Once | INTRAMUSCULAR | Status: AC | PRN
  Administered 2018-04-21: 100 mL via INTRAVENOUS

## 2018-04-21 NOTE — Discharge Instructions (Addendum)
You were seen in the emergency department for some right lower quadrant pain.  You had lab work and a CAT scan that did not show an obvious reason for your pain.  You had some findings on the CT that will need follow-up with your primary care doctor and we are including report of that in this note.  On review of your CT there was a lot of stool in the right side so it may be that you are having some pain secondary to that.  You should try to increase fiber and possibly try a laxative.  It will be important for you to follow-up with your primary care doctor and please return if you have a fever or worsening pain.  IMPRESSION:  1. No acute findings in the abdomen pelvis.  2. 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  Recommend followup by abdomen and pelvis CTA in 6 months, and  vascular surgery referral/consultation if not already obtained. This  recommendation follows ACR consensus guidelines: White Paper of the  ACR Incidental Findings Committee II on Vascular Findings. J Am Coll  Radiol 2013; 10:789-794.  3. Several cystic lesions in the pancreas. No pancreatic mass or  duct dilatation. Favor benign cystic change. No specific follow-up  recommended in this age group.  4. Mild lung base bronchiectasis.  5. Chronic compression fracture T12

## 2018-04-21 NOTE — ED Notes (Signed)
Nurse collecting labs. 

## 2018-04-21 NOTE — ED Notes (Signed)
Patient transported to CT 

## 2018-04-21 NOTE — ED Triage Notes (Signed)
Pt brought in by [facility] from assisted living at [facility] for intermittent RLQ abdominal pain that started yesterday. Pt daughter states he was diagnosed with pneumonia x10 days ago, states he finished antibiotic tx. Pt endorses intermittent nausea and constipation. Pt has hx of stroke x4 years ago with residual slurred speech.

## 2018-04-21 NOTE — ED Notes (Signed)
Patient verbalized understanding of discharge instructions. Opportunities to ask questions was given. Patient left in NAD and ambulatory with assistance.

## 2018-04-21 NOTE — ED Provider Notes (Signed)
[facility] DEPARTMENT Provider Note   CSN: [id number] Arrival date & time: 04/21/18  1526     History   Chief Complaint Chief Complaint  Patient presents with  . Abdominal Pain    HPI Jon [name] is a 85 y.o. male.  He is brought in by ambulance for evaluation of right lower quadrant abdominal pain and generalized malaise.  His daughter is here and helping with some history.  It sounds like he had diagnosis of pneumonia about 10 days ago and took Augmentin for it for 7 days.  He says his breathing seems improved.  Starting yesterday he is noticed some sharp stabbing right lower quadrant abdominal pain that he rates about 7 out of 10 intermittent.  It is associated with some generalized weakness fatigue sleeping more.  No reported fever.  He was nauseous with this.  He has not been moving his bowels much secondary to poor intake and activity.  He is never had this before.  No vomiting, no blood from above or below no urinary symptoms.  He has had 2 hernia surgeries but no other abdominal surgeries.  The history is provided by the patient and a relative.  Abdominal Pain  Pain location:  RLQ Pain quality: sharp and stabbing   Pain radiates to:  Does not radiate Pain severity:  Moderate (7/10) Onset quality:  Gradual Timing:  Intermittent Progression:  Unchanged Chronicity:  New Context: previous surgery and recent illness   Context: not recent travel and not trauma   Relieved by:  None tried Worsened by:  Nothing Ineffective treatments:  None tried Associated symptoms: anorexia, constipation, cough, fatigue, fever, nausea and shortness of breath   Associated symptoms: no chest pain, no diarrhea, no dysuria, no hematemesis, no hematochezia, no hematuria, no melena, no sore throat and no vomiting     Past Medical History:  Diagnosis Date  . Abnormal PFTs 10/30/2012   Followed in Pulmonary clinic/ [facility]/ [place]  - PFT's 10/22/12 VC  55%  no obst, DLCO 50%  - PFT's 12/20/2012 VC 83% and no obst, dLCO 55%  -11/08/2012  Walked RA x 3 laps @ 185 ft each stopped due to  End of study, not desat -11/08/12 esr 10    . Abnormality of gait 04/18/2013  . Acute on chronic systolic congestive heart failure, NYHA class 2 ([place]) 03/16/2006   BNP 376.9 05/28/13   . Adjustment disorder with mixed anxiety and depressed mood 04/18/2013   Post CVA depression and anxiety   . Allergy   . Anxiety   . Aortic aneurysm of unspecified site without mention of rupture 10/27/2011  . Arthritis   . Atherosclerosis of renal artery ([place]) 10/18/2006  . Atrial fibrillation ([place]) 03/04/2011    He was on coumadin until his stroke last December and was switched to apixaban 2.4m daily which he has been taking faithfully without reported side effects.    . Balance disorder 07/31/2014  . Basal cell carcinoma of skin of other and unspecified parts of face 12/24/2009  . Blood transfusion without reported diagnosis   . Cardiac pacemaker in situ- MDT 10/11 03/18/2010   Medtronic revo implant in October 2011. Severe sinus bradycardia in the 30s, but not truly pacemaker dependent   . Cataract   . CKD stage 2 due to type 2 diabetes mellitus ([place] 07/03/2014  . Coronary artery disease   . CVA - Rt brain stroke 12/14 02/18/2013   02/19/13 angiography CT head:  Diffuse atherosclerotic  irregularity and plaque formation of the distal right common carotid artery and proximal internal carotid artery without significant stenosis. Plaque ulceration is present and is a source of emboli. A small right thalamic CVA    . DM (diabetes mellitus), type 2 with renal complications ([place]) 1/61/0960  . Dyslipidemia 11/03/2004  . Fall 03/04/2011  . Fatigue 04/18/2013  . Gout, unspecified 11/03/2004  . Heart murmur   . History of abdominal aortic aneurysm   . History of Doppler ultrasound 05/22/2012   LEAs; R anterior tibial artery appeared occluded; L posterior tibial shows short segment of occlusive ds    . History of Doppler ultrasound 05/22/2012   Abdominal Aortic Doppler; slight increase in fusiform aneurysm   . History of echocardiogram 12/2008   EF >55%; mild concentric LVH; mild MR; mild-mod TR; mild AV regurg;   . History of nuclear stress test 12/2010   lexiscan; low risk; compared to prior study, perfusion improved  . HTN (hypertension) 03/04/2011  . Hyperlipidemia   . Hypertension   . Hypertrophy of prostate with urinary obstruction and other lower urinary tract symptoms (LUTS) 04/28/2004  . Hyponatremia 03/04/2011  . Hypothyroidism 03/04/2011  . Impotence of organic origin 10/03/2000  . Internal hemorrhoids without mention of complication 45/40/9811  . Long term (current) use of anticoagulants 03/17/2011  . Major depressive disorder, single episode, unspecified 03/05/2009  . Memory change 01/24/2013  . Muscle weakness (generalized) 03/10/2011  . Nocturia 10/27/2011  . Obstructive sleep apnea (adult) (pediatric) 07/23/2008  . Open wound of knee, leg (except thigh), and ankle, complicated 11/19/7827  . Osteoarthritis of both knees 07/31/2014  . Osteoarthrosis, unspecified whether generalized or localized, unspecified site 02/05/2003  . PAF (paroxysmal atrial fibrillation) (HCC)    coumadin  . Pain in joint, lower leg 08/04/2011  . Pruritic condition 01/24/2013  . Quadriceps weakness 07/31/2014  . Right bundle branch block 04/19/2006  . 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 Spinal stenosis in cervical region 12/15/2004  . SSS (sick sinus syndrome) ([place]) 04/08/2014  . Stroke ([place])   . Thyroid disease   . TIA (transient ischemic attack) 04/30/2013  . Type II or unspecified type diabetes mellitus without mention of complication, not stated as uncontrolled 11/24/2004  . Unspecified vitamin D deficiency 10/18/2006  . 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 Increased exposure of the right cornea.     Patient Active  Problem List   Diagnosis Date Noted  . Skin lesion of cheek 11/09/2017  . Counseling regarding advanced care planning and goals of care 03/15/2017  . History of sleep apnea 11/08/2016  . Daytime sleepiness 11/08/2016  . Paresthesia 06/15/2016  . Infection of eye region 02/22/2016  . Atherosclerosis of native coronary artery of native heart without angina pectoris 11/03/2015  . Ectropion of right eye 09/24/2015  . Dysuria 09/24/2015  . Loss of weight 06/11/2015  . Incontinent of urine 05/25/2015  . Pilonidal cyst 04/09/2015  . AAA (abdominal aortic aneurysm) ([place]) 04/07/2015  . Right heart failure ([place]) 09/03/2014  . Osteoarthritis of both knees 07/31/2014  . Quadriceps weakness 07/31/2014  . Dyspnea 07/03/2014  . DM (diabetes mellitus), type 2 with renal complications ([place]) 56/21/3086  . CKD (chronic kidney disease) stage 2, GFR 60-89 ml/min 07/03/2014  . Lower back pain 05/29/2014  . SSS (sick sinus syndrome) ([place]) 04/08/2014  . Constipation 03/27/2014  . Fecal incontinence 02/17/2014  . Chronic systolic congestive heart failure (  [place]) 02/17/2014  . Urgency of urination 02/13/2014  . Cerebral infarction due to embolism of cerebral artery ([place]) 01/06/2014  . Hyponatremia 12/26/2013  . GERD (gastroesophageal reflux disease) 10/24/2013  . Epistaxis, recurrent 10/22/2013  . Dysphagia 10/16/2013  . Foot spasms 09/12/2013  . Rhinorrhea 08/15/2013  . Edema 06/01/2013  . History of CVA (cerebrovascular accident) 04/30/2013  . 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 Abnormality of gait 04/18/2013  . Adjustment disorder with mixed anxiety and depressed mood 04/18/2013  . History of depression 04/11/2013  . 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 CVA - Rt brain stroke 12/14 02/18/2013  . Memory change 01/24/2013  . Pruritic condition 01/24/2013  . Abnormal PFTs 10/30/2012  . Allergic rhinitis 09/13/2012  . Aortic aneurysm ([place]) 10/27/2011  . Nocturia  10/27/2011  . Pain in left knee 08/04/2011  . Long term current use of anticoagulant therapy 03/17/2011  . Muscle weakness (generalized) 03/10/2011  . Aspiration pneumonia ([place]) 03/04/2011  . Atrial fibrillation ([place]) 03/04/2011  . Fall 03/04/2011  . HTN (hypertension) 03/04/2011  . Hypothyroidism 03/04/2011  . Internal hemorrhoids without mention of complication 61/44/3154  . Cardiac pacemaker in situ- MDT 10/11 03/18/2010  . Basal cell carcinoma of skin of other and unspecified parts of face 12/24/2009  . Unspecified sinusitis (chronic) 07/01/2009  . Syncope and collapse 01/19/2009  . Sleep apnea 07/23/2008  . Vitamin D deficiency 10/18/2006  . Atherosclerosis of renal artery ([place]) 10/18/2006  . Pain in joint, shoulder region 10/18/2006  . Right bundle branch block 04/19/2006  . Urinary frequency 03/16/2006  . Spinal stenosis in cervical region 12/15/2004  . Diabetes mellitus, type 2, with circulatory disorder ([place]) 11/24/2004  . Dyslipidemia 11/03/2004  . Gout 11/03/2004  . Coronary atherosclerosis 11/03/2004  . Enlarged prostate with lower urinary tract symptoms (LUTS) 04/28/2004  . Osteoarthritis 02/05/2003  . Impotence, organic 10/03/2000    Past Surgical History:  Procedure Laterality Date  . CORONARY ANGIOPLASTY WITH STENT PLACEMENT  2008   stent to SVG to OM  . CORONARY ARTERY BYPASS GRAFT  1989  . EYE SURGERY    . HERNIA REPAIR    . INSERT / REPLACE / REMOVE PACEMAKER    . JOINT REPLACEMENT    . PACEMAKER INSERTION  2011        Home Medications    Prior to Admission medications   Medication Sig Start Date End Date Taking? Authorizing Provider  allopurinol (ZYLOPRIM) 100 MG tablet Take 50 mg by mouth daily.     [provider]  ALPRAZolam Duanne Moron) 0.25 MG tablet Take 1 tablet (0.25 mg total) by mouth 2 (two) times daily as needed for anxiety. 04/20/18   Mast, Man X, NP  amoxicillin (AMOXIL) 500 MG capsule Take 500 mg by mouth. Take 4 caps prior to  dental procedures    [provider]  amoxicillin-clavulanate (AUGMENTIN) 875-125 MG tablet Take 1 tablet by mouth 2 (two) times daily. 04/11/18 04/24/18  [provider]  apixaban (ELIQUIS) 2.5 MG TABS tablet Take 1 tablet (2.5 mg total) by mouth 2 (two) times daily. 10/22/13   Hilty, Nadean Corwin, MD  calcium carbonate (TUMS - DOSED IN MG ELEMENTAL CALCIUM) 500 MG chewable tablet Chew 1 tablet by mouth as needed for indigestion or heartburn.    [provider]  Carboxymethylcellulose Sodium (THERATEARS OP) Place 1 drop into both eyes at bedtime as needed.    [provider]  Cholecalciferol 1000 units tablet Take 2,000 Units by  mouth daily.    [provider]  furosemide (LASIX) 20 MG tablet Take 60 mg by mouth daily.     [provider]  furosemide (LASIX) 20 MG tablet Take 20 mg by mouth as needed (weight gain > 3 lbs in 24 hours or >5 lbs in a week).    [provider]  ipratropium (ATROVENT) 0.06 % nasal spray Place 1 spray into both nostrils 3 (three) times daily as needed for rhinitis.    [provider]  ipratropium-albuterol (DUONEB) 0.5-2.5 (3) MG/3ML SOLN Take 3 mLs by nebulization 4 (four) times daily as needed.    [provider]  levothyroxine (SYNTHROID, LEVOTHROID) 125 MCG tablet Take 125 mcg by mouth daily before breakfast.    [provider]  Light Mineral Oil-Mineral Oil (RETAINE MGD OP) Place 1 drop into both eyes at bedtime as needed.    [provider]  losartan (COZAAR) 25 MG tablet Take 1 tablet (25 mg total) by mouth daily. 11/03/15   Hilty, Nadean Corwin, MD  meclizine (ANTIVERT) 25 MG tablet Take 25 mg by mouth every 6 (six) hours as needed.     [provider]  Menthol, Topical Analgesic, (BIOFREEZE) 4 % GEL Apply topically. Apply to left knee four times daily as needed for pain    [provider]  metoprolol tartrate (LOPRESSOR) 25 MG tablet Take 1 tablet (25 mg total)  by mouth 2 (two) times daily. 08/10/16   Croitoru, Mihai, MD  nitroGLYCERIN (NITROSTAT) 0.4 MG SL tablet Place 0.4 mg under the tongue every 5 (five) minutes as needed for chest pain.     [provider]  polyethylene glycol (MIRALAX / GLYCOLAX) packet Take 17 g by mouth daily as needed.     [provider]  saccharomyces boulardii (FLORASTOR) 250 MG capsule Take 250 mg by mouth 2 (two) times daily. 04/11/18 04/24/18  [provider]  tobramycin-dexamethasone [facility]) ophthalmic ointment Place 1 application into both eyes 2 (two) times daily.    [provider]  triamcinolone cream (KENALOG) 0.1 % Apply 1 application topically daily.    [provider]    Family History Family History  Problem Relation Age of Onset  . Diabetes Father   . Other Mother        PAD with amputations    Social History Social History   Tobacco Use  . Smoking status: Former Smoker    Packs/day: 0.75    Years: 15.00    Pack years: 11.25    Types: Cigarettes    Last attempt to quit: 03/04/1967    Years since quitting: 51.1  . Smokeless tobacco: Never Used  Substance Use Topics  . Alcohol use: No    Alcohol/week: 0.0 standard drinks  . Drug use: No     Allergies   Quinolones; Caduet [amlodipine-atorvastatin]; Crestor [rosuvastatin]; Lipitor [atorvastatin calcium]; Simvastatin; Norvasc [amlodipine]; and Lisinopril   Review of Systems Review of Systems  Constitutional: Positive for activity change, appetite change, fatigue and fever.  HENT: Negative for sore throat.   Eyes: Negative for pain.  Respiratory: Positive for cough and shortness of breath.   Cardiovascular: Negative for chest pain.  Gastrointestinal: Positive for abdominal pain, anorexia, constipation and nausea. Negative for diarrhea, hematemesis, hematochezia, melena and vomiting.  Genitourinary: Negative for dysuria and hematuria.  Musculoskeletal: Negative for neck pain.  Skin: Negative for  rash.  Neurological: Negative for headaches.     Physical Exam Updated Vital Signs BP (!) 157/82  Pulse 67   Temp 97.8 F (36.6 C) (Oral)   Resp 20   Ht '5\' 8"'  (1.727 m)   Wt 72 kg   SpO2 95%   BMI 24.14 kg/m   Physical Exam Vitals signs and nursing note reviewed.  Constitutional:      Appearance: He is well-developed.  HENT:     Head: Normocephalic and atraumatic.  Eyes:     Conjunctiva/sclera: Conjunctivae normal.     Comments: Irregular pupil on the left postsurgical.  Neck:     Musculoskeletal: Neck supple.  Cardiovascular:     Rate and Rhythm: Normal rate and regular rhythm.     Heart sounds: No murmur.  Pulmonary:     Effort: Pulmonary effort is normal. No respiratory distress.     Breath sounds: Normal breath sounds.  Abdominal:     Palpations: Abdomen is soft.     Tenderness: There is abdominal tenderness in the right lower quadrant. There is no guarding or rebound.  Musculoskeletal: Normal range of motion.        General: No tenderness or signs of injury.  Skin:    General: Skin is warm and dry.     Capillary Refill: Capillary refill takes less than 2 seconds.  Neurological:     Mental Status: He is alert.     Cranial Nerves: Cranial nerve deficit (sl slurred speech - old, waxes and wanes) present.     Motor: No weakness.      ED Treatments / Results  Labs (all labs ordered are listed, but only abnormal results are displayed) Labs Reviewed  COMPREHENSIVE METABOLIC PANEL - Abnormal; Notable for the following components:      Result Value   Glucose, Bld 128 (*)    BUN 29 (*)    Creatinine, Ser 1.26 (*)    GFR calc non Af Amer 49 (*)    GFR calc Af Amer 56 (*)    All other components within normal limits  LIPASE, BLOOD - Abnormal; Notable for the following components:   Lipase 69 (*)    All other components within normal limits  TROPONIN I - Abnormal; Notable for the following components:   Troponin I 0.07 (*)    All other components within  normal limits  URINALYSIS, ROUTINE W REFLEX MICROSCOPIC - Abnormal; Notable for the following components:   Specific Gravity, Urine 1.040 (*)    All other components within normal limits  BRAIN NATRIURETIC PEPTIDE - Abnormal; Notable for the following components:   B Natriuretic Peptide 228.3 (*)    All other components within normal limits  TROPONIN I - Abnormal; Notable for the following components:   Troponin I 0.06 (*)    All other components within normal limits  LACTIC ACID, PLASMA  CBC WITH DIFFERENTIAL/PLATELET    EKG EKG Interpretation  Date/Time:  Saturday April 21 2018 15:54:26 EST Ventricular Rate:  76 PR Interval:    QRS Duration: 152 QT Interval:  463 QTC Calculation: 521 R Axis:   94 Text Interpretation:  Afib/flut and V-paced complexes No further rhythm analysis attempted due to paced rhythm Right bundle branch block Borderline ST depression, lateral leads Baseline wander in lead(s) V3 Confirmed by Aletta Edouard [phone]) on 04/21/2018 3:58:07 PM   Radiology Dg Chest 2 View  Result Date: 04/21/2018 CLINICAL DATA:  RIGHT lower quadrant abdominal pain EXAM: CHEST - 2 VIEW COMPARISON:  04/30/2013 FINDINGS: Sternotomy wires overlie normal cardiac silhouette. Normal pulmonary vasculature. No effusion, infiltrate, or pneumothorax. No acute  osseous abnormality. LEFT-sided pacemaker. IMPRESSION: No acute cardiopulmonary process. Electronically Signed   By: Suzy Bouchard M.D.   On: 04/21/2018 17:18   Ct Abdomen Pelvis W Contrast  Result Date: 04/21/2018 CLINICAL DATA:  Abdominal pain for 1 week EXAM: CT ABDOMEN AND PELVIS WITH CONTRAST TECHNIQUE: Multidetector CT imaging of the abdomen and pelvis was performed using the standard protocol following bolus administration of intravenous contrast. CONTRAST:  175m OMNIPAQUE IOHEXOL 300 MG/ML  SOLN COMPARISON:  Lumbar CT 06/25/2014 FINDINGS: Lower chest: Mild bronchiectasis at the lung bases. Hepatobiliary: No focal hepatic  lesion.  Gallbladder normal. Pancreas: Several cystic appearing lesions in the pancreas. One in the pancreatic head measures 14 mm (image 30/3). On towards the tail measures 10 mm (image 22/3). No duct dilatation. The pancreas is mildly at atrophic which is likely related to patient's age. Spleen: Normal spleen Adrenals/urinary tract: Adrenal glands and kidneys are normal. The ureters and bladder normal. Stomach/Bowel: Stomach, small bowel, appendix, and cecum are normal. The colon and rectosigmoid colon are normal. Vascular/Lymphatic: Infrarenal abdominal aorta contains a saccular aneurysm measuring 45 mm in greatest dimension seen on sagittal image 63/7. Aneurysm measures 40 mm in transverse dimension. Heavy intimal calcification of the abdominal aorta. No lymphadenopathy Reproductive: Prostate normal Other: Small LEFT inguinal hernia is fat filled Musculoskeletal: No acute osseous abnormality. Compression deformity at T12 is chronic. Smudgy scan sclerotic lesions in the sacrum appear symmetric side-to-side. Favored benign. IMPRESSION: 1. No acute findings in the abdomen pelvis. 2. 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 Recommend followup by abdomen and pelvis CTA in 6 months, and vascular surgery referral/consultation if not already obtained. This recommendation follows ACR consensus guidelines: White Paper of the ACR Incidental Findings Committee II on Vascular Findings. J Am Coll Radiol 2013; 10:789-794. 3. Several cystic lesions in the pancreas. No pancreatic mass or duct dilatation. Favor benign cystic change. No specific follow-up recommended in this age group. 4. Mild lung base bronchiectasis. 5. Chronic compression fracture T12 Electronically Signed   By: SSuzy BouchardM.D.   On: 04/21/2018 19:44    Procedures Procedures (including critical care time)  Medications Ordered in ED Medications  iohexol (OMNIPAQUE) 300 MG/ML solution 100 mL (100 mLs Intravenous Contrast Given  04/21/18 1852)  ALPRAZolam (Duanne Moron tablet 0.25 mg (0.25 mg Oral Given 04/21/18 1936)     Initial Impression / Assessment and Plan / ED Course  I have reviewed the triage vital signs and the nursing notes.  Pertinent labs & imaging results that were available during my care of the patient were reviewed by me and considered in my medical decision making (see chart for details).  Clinical Course as of Apr 21 2134  Sat Feb 15, [address]60 [id number] old male with right lower quadrant pain that started this morning.  Says is intermittent stabbing in nature not really associated with anything.  His Trope was mildly elevated 0.07 so were getting a second treatment.  BNP is 228 but sounds like where he can arounds.  Lipase is a nonspecific 69.  No white count no fever.  CT did not show any acute abdominal process but did comment upon a saccular aneurysm infrarenal and some likely pancreatic cysts.   [MB]  2048 Delta troponin is essentially unchanged 0.07 now 0.06.  They are comfortable with discharge and will have close follow-up with her PCP.  Understand to return if any worsening symptoms.   [MB]    Clinical Course User Index [MB] BHayden Rasmussen  MD     Final Clinical Impressions(s) / ED Diagnoses   Final diagnoses:  Right lower quadrant abdominal pain  Malaise and fatigue    ED Discharge Orders    None       Hayden Rasmussen, MD 04/21/18 2136

## 2018-04-23 ENCOUNTER — Telehealth: Payer: Self-pay | Admitting: Internal Medicine

## 2018-04-23 ENCOUNTER — Non-Acute Institutional Stay: Payer: Medicare Other | Admitting: Nurse Practitioner

## 2018-04-23 ENCOUNTER — Encounter: Payer: Self-pay | Admitting: Nurse Practitioner

## 2018-04-23 DIAGNOSIS — I714 Abdominal aortic aneurysm, without rupture, unspecified: Secondary | ICD-10-CM

## 2018-04-23 DIAGNOSIS — K5900 Constipation, unspecified: Secondary | ICD-10-CM

## 2018-04-23 DIAGNOSIS — E559 Vitamin D deficiency, unspecified: Secondary | ICD-10-CM | POA: Diagnosis not present

## 2018-04-23 DIAGNOSIS — E039 Hypothyroidism, unspecified: Secondary | ICD-10-CM | POA: Diagnosis not present

## 2018-04-23 DIAGNOSIS — I259 Chronic ischemic heart disease, unspecified: Secondary | ICD-10-CM | POA: Diagnosis not present

## 2018-04-23 DIAGNOSIS — R471 Dysarthria and anarthria: Secondary | ICD-10-CM | POA: Diagnosis not present

## 2018-04-23 DIAGNOSIS — I671 Cerebral aneurysm, nonruptured: Secondary | ICD-10-CM | POA: Diagnosis not present

## 2018-04-23 DIAGNOSIS — F419 Anxiety disorder, unspecified: Secondary | ICD-10-CM | POA: Diagnosis not present

## 2018-04-23 DIAGNOSIS — R1312 Dysphagia, oropharyngeal phase: Secondary | ICD-10-CM | POA: Diagnosis not present

## 2018-04-23 NOTE — Assessment & Plan Note (Signed)
ED 04/21/18, CTA abd/pelvis 6 months, f/u vascular surgeon.

## 2018-04-23 NOTE — Telephone Encounter (Signed)
Spoke with Helene Kelp from [facility] who requests that Dr. Debara Pickett review recommendations for pt after pt recent ED visit on 04/21/2018.   04/21/2018 CT abdomen and pelvis impression states: "Saccular aneurysm of the infrarenal abdominal aorta to 45 mm. Recommend followup by abdomen and pelvis CTA in 6 months, and vascular surgery referral/consultation if not already obtained"  It appears no referral orders in system for vascular surgery nor are there orders for repeat abdomen and pelvis CTA. Helene Kelp is requesting that these be ordered by Dr. Debara Pickett. She states that she will fax a 89 telephone orders form to office. Will place in Dr. Lysbeth Penner mailbox. Informed her that message would be routed to Dr. Debara Pickett for review

## 2018-04-23 NOTE — Assessment & Plan Note (Signed)
Rectal digital exam noted impacted stool, will administer MOM 37ml x1 po start, DulcoLax 10mg  suppository rectal x1, change MiraLax from prn to day.

## 2018-04-23 NOTE — Telephone Encounter (Addendum)
[place] and spoke with Helene Kelp the nurse taking care of the pt. Adv her of Dr.Hilty's response below.  Ref placed to [facility] for known AAA. Now 80mm. [facility] will contact [facility]  directly to schedule the consult.  Helene Kelp verbalized understanding and voiced appreciation for the call.

## 2018-04-23 NOTE — Progress Notes (Signed)
Location:  [place] Room Number: [place] of Service:  [phone]) Provider:  Marlana Latus  NP  Darice Vicario X, NP  Patient Care Team: Lucretia Pendley X, NP as PCP - General (Internal Medicine) [place], Nadean Corwin, MD as Consulting Physician (Cardiology) Ardis Hughs, MD as Attending Physician (Urology) Tyrena Gohr X, NP as Nurse Practitioner (Internal Medicine)  Extended Emergency Contact Information Primary Emergency Contact: [name] of [place] Phone: [phone] Relation: Daughter Secondary Emergency Contact: Eyvonne Mechanic [place] of [place] Phone: [phone] Mobile Phone: [phone] Relation: Daughter    Code Status:   DNR Goals of care: Advanced Directive information Advanced Directives 04/21/2018  Does Patient Have a Medical Advance Directive? Yes  Type of Advance Directive Out of facility DNR (pink MOST or yellow form);Healthcare Power of Attorney  Does patient want to make changes to medical advance directive? -  Copy of [place] in Chart? -  Pre-existing out of facility DNR order (yellow form or pink MOST form) -     Chief Complaint  Patient presents with  . Acute Visit    L abd pain, constIpation    HPI:  Pt is a 85 y.o. male seen today for an acute visit for constipation, no BM x 2-3days, had MOM 77m 04/21/18, MiraLax x1 04/22/18, hard stool felt upon my rectal exam today. ED eval 04/21/18 for right sided abd pain, incidental finding on CT abd/pelvis: 1. No acute findings in the abdomen pelvis. 2. 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 Recommend followup by abdomen and pelvis CTA in 6 months, and vascular surgery referral/consultation if not already obtained. This recommendation follows ACR consensus guidelines: White Paper of the ACR Incidental Findings Committee II on Vascular Findings. J Am Coll Radiol 2013; 10:789-794. 3.  Several cystic lesions in the pancreas. No pancreatic mass or duct dilatation. Favor benign cystic change. No specific follow-up recommended in this age group. 4. Mild lung base bronchiectasis. 5. Chronic compression fracture T12 ED: Na 136, K 3.8, Bun 29, creat 1.26, BNP 228, wbc 9.6, Hgb 15.3, plt 184.  ED: CXR no acute finding. EKG Afib, vent rate 76    Past Medical History:  Diagnosis Date  . Abnormal PFTs 10/30/2012   Followed in Pulmonary clinic/ [facility]/ [place]  - PFT's 10/22/12 VC  55% no obst, DLCO 50%  - PFT's 12/20/2012 VC 83% and no obst, dLCO 55%  -11/08/2012  Walked RA x 3 laps @ 185 ft each stopped due to  End of study, not desat -11/08/12 esr 10    . Abnormality of gait 04/18/2013  . Acute on chronic systolic congestive heart failure, NYHA class 2 ([place] 03/16/2006   BNP 376.9 05/28/13   . Adjustment disorder with mixed anxiety and depressed mood 04/18/2013   Post CVA depression and anxiety   . Allergy   . Anxiety   . Aortic aneurysm of unspecified site without mention of rupture 10/27/2011  . Arthritis   . Atherosclerosis of renal artery ([place] 10/18/2006  . Atrial fibrillation ([place] 03/04/2011    He was on coumadin until his stroke last December and was switched to apixaban 2.'5mg'$  daily which he has been taking faithfully without reported side effects.    . Balance disorder 07/31/2014  . Basal cell carcinoma of skin of other and unspecified parts of face 12/24/2009  . Blood transfusion without reported diagnosis   . Cardiac pacemaker in situ-  MDT 10/11 03/18/2010   Medtronic revo implant in October 2011. Severe sinus bradycardia in the 30s, but not truly pacemaker dependent   . Cataract   . CKD stage 2 due to type 2 diabetes mellitus ([place]) 07/03/2014  . Coronary artery disease   . CVA - Rt brain stroke 12/14 02/18/2013   02/19/13 angiography CT head:  Diffuse atherosclerotic irregularity and plaque formation of the distal right common carotid artery and proximal internal  carotid artery without significant stenosis. Plaque ulceration is present and is a source of emboli. A small right thalamic CVA    . DM (diabetes mellitus), type 2 with renal complications ([place]) 7/74/1287  . Dyslipidemia 11/03/2004  . Fall 03/04/2011  . Fatigue 04/18/2013  . Gout, unspecified 11/03/2004  . Heart murmur   . History of abdominal aortic aneurysm   . History of Doppler ultrasound 05/22/2012   LEAs; R anterior tibial artery appeared occluded; L posterior tibial shows short segment of occlusive ds  . History of Doppler ultrasound 05/22/2012   Abdominal Aortic Doppler; slight increase in fusiform aneurysm   . History of echocardiogram 12/2008   EF >55%; mild concentric LVH; mild MR; mild-mod TR; mild AV regurg;   . History of nuclear stress test 12/2010   lexiscan; low risk; compared to prior study, perfusion improved  . HTN (hypertension) 03/04/2011  . Hyperlipidemia   . Hypertension   . Hypertrophy of prostate with urinary obstruction and other lower urinary tract symptoms (LUTS) 04/28/2004  . Hyponatremia 03/04/2011  . Hypothyroidism 03/04/2011  . Impotence of organic origin 10/03/2000  . Internal hemorrhoids without mention of complication 86/76/7209  . Long term (current) use of anticoagulants 03/17/2011  . Major depressive disorder, single episode, unspecified 03/05/2009  . Memory change 01/24/2013  . Muscle weakness (generalized) 03/10/2011  . Nocturia 10/27/2011  . Obstructive sleep apnea (adult) (pediatric) 07/23/2008  . Open wound of knee, leg (except thigh), and ankle, complicated 4/70/9628  . Osteoarthritis of both knees 07/31/2014  . Osteoarthrosis, unspecified whether generalized or localized, unspecified site 02/05/2003  . PAF (paroxysmal atrial fibrillation) (HCC)    coumadin  . Pain in joint, lower leg 08/04/2011  . Pruritic condition 01/24/2013  . Quadriceps weakness 07/31/2014  . Right bundle branch block 04/19/2006  . 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 Spinal stenosis in cervical region 12/15/2004  . SSS (sick sinus syndrome) ([place]) 04/08/2014  . Stroke ([place])   . Thyroid disease   . TIA (transient ischemic attack) 04/30/2013  . Type II or unspecified type diabetes mellitus without mention of complication, not stated as uncontrolled 11/24/2004  . Unspecified vitamin D deficiency 10/18/2006  . 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 Increased exposure of the right cornea.    Past Surgical History:  Procedure Laterality Date  . CORONARY ANGIOPLASTY WITH STENT PLACEMENT  2008   stent to SVG to OM  . CORONARY ARTERY BYPASS GRAFT  1989  . EYE SURGERY    . HERNIA REPAIR    . INSERT / REPLACE / REMOVE PACEMAKER    . JOINT REPLACEMENT    . PACEMAKER INSERTION  2011    Allergies  Allergen Reactions  . Quinolones Other (See Comments)    Risk of rupture of AAA  . Caduet [Amlodipine-Atorvastatin] Other (See Comments)    Weakness   . Crestor [Rosuvastatin] Other (See Comments)    Muscle pain/weakness  . Lipitor [Atorvastatin Calcium] Other (  See Comments)    Muscle pain/weakness  . Simvastatin Other (See Comments)    Muscle pain/weakness  . Norvasc [Amlodipine]   . Lisinopril Other (See Comments)    Doesn't remember reaction    Outpatient Encounter Medications as of 04/23/2018  Medication Sig  . allopurinol (ZYLOPRIM) 100 MG tablet Take 50 mg by mouth daily.   Marland Kitchen ALPRAZolam (XANAX) 0.25 MG tablet Take 1 tablet (0.25 mg total) by mouth 2 (two) times daily as needed for anxiety.  Marland Kitchen apixaban (ELIQUIS) 2.5 MG TABS tablet Take 1 tablet (2.5 mg total) by mouth 2 (two) times daily.  . bisacodyl (DULCOLAX) 10 MG suppository Place 10 mg rectally daily as needed for moderate constipation (May give Dulcolax (bisacodyl) suppository per rectum X 1 PRN for constipation. If no bowel movement in 24 hours, check rectum for hard stool. Remove digitally if present. Give Fleet enema per rectum x1  dose after removing hard stool in rectum.).  Marland Kitchen calcium carbonate (TUMS - DOSED IN MG ELEMENTAL CALCIUM) 500 MG chewable tablet Chew 1 tablet by mouth 3 (three) times daily as needed for indigestion or heartburn.   . Carboxymethylcellulose Sodium (THERATEARS OP) Place 1 drop into both eyes at bedtime as needed (DRY EYES).   . Cholecalciferol 1000 units tablet Take 2,000 Units by mouth daily.  . furosemide (LASIX) 20 MG tablet Take 20 mg by mouth daily as needed. TAKE ONE TABLET BY MOUTH AS NEEDED IF WEIGHT GAIN >3LBS IN 24HRS OR > 5LBS IN ONE WEEK. * SEE WEEKLY WEIGHTS*  . furosemide (LASIX) 20 MG tablet Take 20 mg by mouth as needed (weight gain > 3 lbs in 24 hours or >5 lbs in a week).  . furosemide (LASIX) 20 MG tablet Take 80 mg by mouth daily.  . Humidifiers ([employer] COOL MIST HUMIDIFER) MISC by Does not apply route. RES. STATES DOES NOT USE HUMIDIFIER IN THE SUMMER MONTHS/REQUEST THIS ORDER CHANGED TO PRN: ONCE HUMIDIFIER IS BACK IN USE, STAFF TO CHECK EVERY SHIFT/FILLED WITH WATER AS NEEDED  . ipratropium (ATROVENT) 0.06 % nasal spray Place 1 spray into both nostrils 3 (three) times daily as needed for rhinitis.  Marland Kitchen ipratropium-albuterol (DUONEB) 0.5-2.5 (3) MG/3ML SOLN Take 3 mLs by nebulization 4 (four) times daily as needed (SHORTNESS OF BREATH).   Marland Kitchen levothyroxine (SYNTHROID, LEVOTHROID) 125 MCG tablet Take 125 mcg by mouth daily before breakfast.  . Light Mineral Oil-Mineral Oil (RETAINE MGD OP) Place 1 drop into both eyes at bedtime.   Marland Kitchen losartan (COZAAR) 25 MG tablet Take 1 tablet (25 mg total) by mouth daily.  . [EXPIRED] magnesium hydroxide (MILK OF MAGNESIA) 400 MG/5ML suspension Take 30 mLs by mouth as needed for mild constipation.  . meclizine (ANTIVERT) 25 MG tablet Take 25 mg by mouth every 6 (six) hours as needed for dizziness.   . Menthol, Topical Analgesic, (BIOFREEZE) 4 % GEL Apply 1 application topically 4 (four) times daily as needed (left knee pain).   . metoprolol tartrate  (LOPRESSOR) 25 MG tablet Take 1 tablet (25 mg total) by mouth 2 (two) times daily.  Marland Kitchen neomycin-bacitracin-polymyxin (NEOSPORIN) [phone] ointment Apply 1 application topically 2 (two) times daily as needed (both nares for dryness).  . nitroGLYCERIN (NITROSTAT) 0.4 MG SL tablet Place 0.4 mg under the tongue every 5 (five) minutes as needed for chest pain.   Marland Kitchen tobramycin-dexamethasone (TOBRADEX) ophthalmic ointment Place 1 application into both eyes 2 (two) times daily.  Marland Kitchen triamcinolone cream (KENALOG) 0.1 % Apply 1 application topically daily.  . [  DISCONTINUED] amoxicillin (AMOXIL) 500 MG capsule Take 500 mg by mouth. Take 4 caps prior to dental procedures  . [DISCONTINUED] polyethylene glycol (MIRALAX / GLYCOLAX) packet Take 17 g by mouth daily as needed for mild constipation.    No facility-administered encounter medications on file as of 04/23/2018.    ROS was provided with assistance of staff Review of Systems  Constitutional: Positive for appetite change and fatigue. Negative for activity change, diaphoresis and fever.  HENT: Positive for hearing loss and trouble swallowing. Negative for congestion and voice change.        Honey thick  Respiratory: Positive for shortness of breath. Negative for cough and wheezing.   Gastrointestinal: Positive for constipation. Negative for abdominal distention, abdominal pain, blood in stool, diarrhea, nausea and vomiting.  Genitourinary: Negative for difficulty urinating, frequency and urgency.  Musculoskeletal: Positive for gait problem.  Skin: Negative for color change and pallor.  Neurological: Negative for dizziness, speech difficulty, weakness and headaches.       Memory lapses.   Psychiatric/Behavioral: Negative for agitation, behavioral problems, hallucinations and sleep disturbance. The patient is not nervous/anxious.     Immunization History  Administered Date(s) Administered  . Influenza Whole 12/06/2011, 12/19/2012, 12/07/2017  .  Influenza-Unspecified 01/09/2014, 11/25/2014, 12/23/2015, 12/26/2016  . PPD Test 05/03/2013  . Pneumococcal Conjugate-13 01/03/2017  . Pneumococcal Polysaccharide-23 03/07/1986  . Td 04/20/2005  . Tdap 01/03/2017   Pertinent  Health Maintenance Due  Topic Date Due  . OPHTHALMOLOGY EXAM  01/12/1932  . FOOT EXAM  07/31/2015  . HEMOGLOBIN A1C  10/23/2017  . PNA vac Low Risk Adult (2 of 2 - PPSV23) 01/03/2018  . INFLUENZA VACCINE  Completed   Fall Risk  01/23/2018 11/21/2017 11/15/2016 10/15/2015 06/11/2015  Falls in the past year? 0 No No No Yes  Comment Emmi Telephone Survey: data to providers prior to load - - - -  Number falls in past yr: - - - - 1  Injury with Fall? - - - - Yes  Risk for fall due to : - - - - -  Follow up - - - - -   Functional Status Survey:    Vitals:   04/23/18 0945  BP: 130/70  Pulse: 70  Resp: 20  Temp: (!) 97.3 F (36.3 C)  SpO2: 97%  Weight: 156 lb 9.6 oz (71 kg)  Height: '5\' 8"'$  (1.727 m)   Body mass index is 23.81 kg/m. Physical Exam Constitutional:      General: He is not in acute distress.    Appearance: Normal appearance. He is not ill-appearing, toxic-appearing or diaphoretic.  HENT:     Head: Normocephalic and atraumatic.     Nose: Nose normal.     Mouth/Throat:     Mouth: Mucous membranes are moist.  Eyes:     Extraocular Movements: Extraocular movements intact.     Pupils: Pupils are equal, round, and reactive to light.  Neck:     Musculoskeletal: Normal range of motion and neck supple.  Cardiovascular:     Rate and Rhythm: Normal rate and regular rhythm.     Heart sounds: Murmur present.  Pulmonary:     Effort: Pulmonary effort is normal.     Breath sounds: Rales present. No wheezing.     Comments: Bibasilar  Chest:     Chest wall: No tenderness.  Abdominal:     General: There is no distension.     Palpations: Abdomen is soft.     Tenderness: There is no  abdominal tenderness. There is no guarding or rebound.    Musculoskeletal:     Right lower leg: No edema.     Left lower leg: No edema.     Comments: Ambulates with walker.   Skin:    General: Skin is warm and dry.     Findings: Bruising present.     Comments: Right forearm  Neurological:     General: No focal deficit present.     Mental Status: He is alert. Mental status is at baseline.     Motor: No weakness.     Coordination: Coordination normal.     Gait: Gait abnormal.     Comments: Oriented to person and place.   Psychiatric:        Mood and Affect: Mood normal.        Behavior: Behavior normal.     Labs reviewed: Recent Labs    04/10/18 04/17/18 04/21/18 1619  NA 137 139 136  K 4.3 4.1 3.8  CL 101 100 100  CO2 '26 26 25  '$ GLUCOSE  --   --  128*  BUN 28* 33* 29*  CREATININE 1.2 1.1 1.26*  CALCIUM 9.7 9.4 9.5   Recent Labs    07/20/17 04/10/18 04/21/18 1619  AST '16 16 20  '$ ALT '11 10 14  '$ ALKPHOS 84 88 83  BILITOT  --   --  1.0  PROT  --  6.3 6.7  ALBUMIN 3.9 3.8 3.6   Recent Labs    07/20/17 04/10/18 04/21/18 1619  WBC 6.2 7.7 9.6  NEUTROABS  --   --  6.9  HGB 15.3 15.5 15.3  HCT 45 46 47.9  MCV  --   --  95.2  PLT 212 218 184   Lab Results  Component Value Date   TSH 1.35 04/10/2018   Lab Results  Component Value Date   HGBA1C 6.9 04/25/2017   Lab Results  Component Value Date   CHOL 170 07/20/2017   HDL 39 07/20/2017   LDLCALC 110 07/20/2017   TRIG 107 07/20/2017   CHOLHDL 3.9 05/01/2013    Significant Diagnostic Results in last 30 days:  Dg Chest 2 View  Result Date: 04/21/2018 CLINICAL DATA:  RIGHT lower quadrant abdominal pain EXAM: CHEST - 2 VIEW COMPARISON:  04/30/2013 FINDINGS: Sternotomy wires overlie normal cardiac silhouette. Normal pulmonary vasculature. No effusion, infiltrate, or pneumothorax. No acute osseous abnormality. LEFT-sided pacemaker. IMPRESSION: No acute cardiopulmonary process. Electronically Signed   By: Suzy Bouchard M.D.   On: 04/21/2018 17:18   Ct Abdomen Pelvis  W Contrast  Result Date: 04/21/2018 CLINICAL DATA:  Abdominal pain for 1 week EXAM: CT ABDOMEN AND PELVIS WITH CONTRAST TECHNIQUE: Multidetector CT imaging of the abdomen and pelvis was performed using the standard protocol following bolus administration of intravenous contrast. CONTRAST:  153m OMNIPAQUE IOHEXOL 300 MG/ML  SOLN COMPARISON:  Lumbar CT 06/25/2014 FINDINGS: Lower chest: Mild bronchiectasis at the lung bases. Hepatobiliary: No focal hepatic lesion.  Gallbladder normal. Pancreas: Several cystic appearing lesions in the pancreas. One in the pancreatic head measures 14 mm (image 30/3). On towards the tail measures 10 mm (image 22/3). No duct dilatation. The pancreas is mildly at atrophic which is likely related to patient's age. Spleen: Normal spleen Adrenals/urinary tract: Adrenal glands and kidneys are normal. The ureters and bladder normal. Stomach/Bowel: Stomach, small bowel, appendix, and cecum are normal. The colon and rectosigmoid colon are normal. Vascular/Lymphatic: Infrarenal abdominal aorta contains a saccular aneurysm measuring 45 mm in  greatest dimension seen on sagittal image 63/7. Aneurysm measures 40 mm in transverse dimension. Heavy intimal calcification of the abdominal aorta. No lymphadenopathy Reproductive: Prostate normal Other: Small LEFT inguinal hernia is fat filled Musculoskeletal: No acute osseous abnormality. Compression deformity at T12 is chronic. Smudgy scan sclerotic lesions in the sacrum appear symmetric side-to-side. Favored benign. IMPRESSION: 1. No acute findings in the abdomen pelvis. 2. 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 Recommend followup by abdomen and pelvis CTA in 6 months, and vascular surgery referral/consultation if not already obtained. This recommendation follows ACR consensus guidelines: White Paper of the ACR Incidental Findings Committee II on Vascular Findings. J Am Coll Radiol 2013; 10:789-794. 3. Several cystic lesions in the  pancreas. No pancreatic mass or duct dilatation. Favor benign cystic change. No specific follow-up recommended in this age group. 4. Mild lung base bronchiectasis. 5. Chronic compression fracture T12 Electronically Signed   By: Suzy Bouchard M.D.   On: 04/21/2018 19:44    Assessment/Plan Constipation Rectal digital exam noted impacted stool, will administer MOM 73m x1 po start, DulcoLax '10mg'$  suppository rectal x1, change MiraLax from prn to day.   Saccular aneurysm ED 04/21/18, CTA abd/pelvis 6 months, f/u vascular surgeon.     Family/ staff Communication: plan of care reviewed with the patient and charge nurse.   Labs/tests ordered: none  Time spend 25 minutes.

## 2018-04-23 NOTE — Telephone Encounter (Signed)
This was a known aneurysm, but is larger. Please refer to Vascular surgery ([facility]VS) for evaluation and management.  Dr. Debara Pickett

## 2018-04-24 ENCOUNTER — Encounter: Payer: Self-pay | Admitting: Nurse Practitioner

## 2018-04-26 DIAGNOSIS — I259 Chronic ischemic heart disease, unspecified: Secondary | ICD-10-CM | POA: Diagnosis not present

## 2018-04-26 DIAGNOSIS — E039 Hypothyroidism, unspecified: Secondary | ICD-10-CM | POA: Diagnosis not present

## 2018-04-26 DIAGNOSIS — R471 Dysarthria and anarthria: Secondary | ICD-10-CM | POA: Diagnosis not present

## 2018-04-26 DIAGNOSIS — R1312 Dysphagia, oropharyngeal phase: Secondary | ICD-10-CM | POA: Diagnosis not present

## 2018-04-26 DIAGNOSIS — F419 Anxiety disorder, unspecified: Secondary | ICD-10-CM | POA: Diagnosis not present

## 2018-04-26 DIAGNOSIS — E559 Vitamin D deficiency, unspecified: Secondary | ICD-10-CM | POA: Diagnosis not present

## 2018-04-27 DIAGNOSIS — I259 Chronic ischemic heart disease, unspecified: Secondary | ICD-10-CM | POA: Diagnosis not present

## 2018-04-27 DIAGNOSIS — R471 Dysarthria and anarthria: Secondary | ICD-10-CM | POA: Diagnosis not present

## 2018-04-27 DIAGNOSIS — E559 Vitamin D deficiency, unspecified: Secondary | ICD-10-CM | POA: Diagnosis not present

## 2018-04-27 DIAGNOSIS — R1312 Dysphagia, oropharyngeal phase: Secondary | ICD-10-CM | POA: Diagnosis not present

## 2018-04-27 DIAGNOSIS — F419 Anxiety disorder, unspecified: Secondary | ICD-10-CM | POA: Diagnosis not present

## 2018-04-27 DIAGNOSIS — E039 Hypothyroidism, unspecified: Secondary | ICD-10-CM | POA: Diagnosis not present

## 2018-05-02 DIAGNOSIS — E559 Vitamin D deficiency, unspecified: Secondary | ICD-10-CM | POA: Diagnosis not present

## 2018-05-02 DIAGNOSIS — E039 Hypothyroidism, unspecified: Secondary | ICD-10-CM | POA: Diagnosis not present

## 2018-05-02 DIAGNOSIS — R471 Dysarthria and anarthria: Secondary | ICD-10-CM | POA: Diagnosis not present

## 2018-05-02 DIAGNOSIS — R1312 Dysphagia, oropharyngeal phase: Secondary | ICD-10-CM | POA: Diagnosis not present

## 2018-05-02 DIAGNOSIS — F419 Anxiety disorder, unspecified: Secondary | ICD-10-CM | POA: Diagnosis not present

## 2018-05-02 DIAGNOSIS — I259 Chronic ischemic heart disease, unspecified: Secondary | ICD-10-CM | POA: Diagnosis not present

## 2018-05-03 DIAGNOSIS — E039 Hypothyroidism, unspecified: Secondary | ICD-10-CM | POA: Diagnosis not present

## 2018-05-03 DIAGNOSIS — F419 Anxiety disorder, unspecified: Secondary | ICD-10-CM | POA: Diagnosis not present

## 2018-05-03 DIAGNOSIS — R1312 Dysphagia, oropharyngeal phase: Secondary | ICD-10-CM | POA: Diagnosis not present

## 2018-05-03 DIAGNOSIS — R471 Dysarthria and anarthria: Secondary | ICD-10-CM | POA: Diagnosis not present

## 2018-05-03 DIAGNOSIS — I259 Chronic ischemic heart disease, unspecified: Secondary | ICD-10-CM | POA: Diagnosis not present

## 2018-05-03 DIAGNOSIS — E559 Vitamin D deficiency, unspecified: Secondary | ICD-10-CM | POA: Diagnosis not present

## 2018-05-04 ENCOUNTER — Encounter: Payer: Self-pay | Admitting: Nurse Practitioner

## 2018-05-04 ENCOUNTER — Non-Acute Institutional Stay: Payer: Medicare Other | Admitting: Nurse Practitioner

## 2018-05-04 DIAGNOSIS — Z7901 Long term (current) use of anticoagulants: Secondary | ICD-10-CM | POA: Diagnosis not present

## 2018-05-04 DIAGNOSIS — F419 Anxiety disorder, unspecified: Secondary | ICD-10-CM

## 2018-05-04 DIAGNOSIS — R040 Epistaxis: Secondary | ICD-10-CM

## 2018-05-04 DIAGNOSIS — F330 Major depressive disorder, recurrent, mild: Secondary | ICD-10-CM

## 2018-05-04 DIAGNOSIS — S90119A Contusion of unspecified great toe without damage to nail, initial encounter: Secondary | ICD-10-CM

## 2018-05-04 DIAGNOSIS — S90129A Contusion of unspecified lesser toe(s) without damage to nail, initial encounter: Secondary | ICD-10-CM | POA: Insufficient documentation

## 2018-05-04 DIAGNOSIS — I4821 Permanent atrial fibrillation: Secondary | ICD-10-CM

## 2018-05-04 DIAGNOSIS — D689 Coagulation defect, unspecified: Secondary | ICD-10-CM | POA: Diagnosis not present

## 2018-05-04 LAB — CBC AND DIFFERENTIAL
HEMATOCRIT: 44 (ref 41–53)
HEMOGLOBIN: 15.2 (ref 13.5–17.5)
Platelets: 196 (ref 150–399)
WBC: 8

## 2018-05-04 NOTE — Progress Notes (Signed)
Location:  [place] Room Number: [place] of Service:  ALF [phone]) Provider: Marlana Latus  NP  Jeryn Cerney X, NP  Patient Care Team: Lennon Boutwell X, NP as PCP - General (Internal Medicine) [place], Nadean Corwin, MD as Consulting Physician (Cardiology) Ardis Hughs, MD as Attending Physician (Urology) Dalis Beers X, NP as Nurse Practitioner (Internal Medicine)  Extended Emergency Contact Information Primary Emergency Contact: [name] of [place] Phone: [phone] Relation: Daughter Secondary Emergency Contact: Eyvonne Mechanic [place] of [place] Phone: [phone] Mobile Phone: [phone] Relation: Daughter  Code Status: DNR Goals of care: Advanced Directive information Advanced Directives 04/21/2018  Does Patient Have a Medical Advance Directive? Yes  Type of Advance Directive Out of facility DNR (pink MOST or yellow form);Healthcare Power of Attorney  Does patient want to make changes to medical advance directive? -  Copy of [place] in Chart? -  Pre-existing out of facility DNR order (yellow form or pink MOST form) -     Chief Complaint  Patient presents with  . Acute Visit    C/o- anxiety, bruise toes    HPI:  Pt is a 85 y.o. male seen today for an acute visit for anxiety, pumped w/c into door frame and bruised the right 1st/2nd toes. Prn Xanax is too sedating during day time, but okay for night sleep. Recurrent nose bleed, not new. On Eliquis 2.'5mg'$  bid for Afib, heart rate is in control, on Metoprolol '25mg'$  bid.    Past Medical History:  Diagnosis Date  . Abnormal PFTs 10/30/2012   Followed in Pulmonary clinic/ [facility]/ [place]  - PFT's 10/22/12 VC  55% no obst, DLCO 50%  - PFT's 12/20/2012 VC 83% and no obst, dLCO 55%  -11/08/2012  Walked RA x 3 laps @ 185 ft each stopped due to  End of study, not desat -11/08/12 esr 10    . Abnormality of gait 04/18/2013    . Acute on chronic systolic congestive heart failure, NYHA class 2 ([place]) 03/16/2006   BNP 376.9 05/28/13   . Adjustment disorder with mixed anxiety and depressed mood 04/18/2013   Post CVA depression and anxiety   . Allergy   . Anxiety   . Aortic aneurysm of unspecified site without mention of rupture 10/27/2011  . Arthritis   . Atherosclerosis of renal artery ([place]) 10/18/2006  . Atrial fibrillation ([place]) 03/04/2011    He was on coumadin until his stroke last December and was switched to apixaban 2.'5mg'$  daily which he has been taking faithfully without reported side effects.    . Balance disorder 07/31/2014  . Basal cell carcinoma of skin of other and unspecified parts of face 12/24/2009  . Blood transfusion without reported diagnosis   . Cardiac pacemaker in situ- MDT 10/11 03/18/2010   Medtronic revo implant in October 2011. Severe sinus bradycardia in the 30s, but not truly pacemaker dependent   . Cataract   . CKD stage 2 due to type 2 diabetes mellitus ([place]) 07/03/2014  . Coronary artery disease   . CVA - Rt brain stroke 12/14 02/18/2013   02/19/13 angiography CT head:  Diffuse atherosclerotic irregularity and plaque formation of the distal right common carotid artery and proximal internal carotid artery without significant stenosis. Plaque ulceration is present and is a source of emboli. A small right thalamic CVA    . DM (diabetes mellitus), type 2 with renal complications ([place]) 8/46/9629  . Dyslipidemia 11/03/2004  .  Fall 03/04/2011  . Fatigue 04/18/2013  . Gout, unspecified 11/03/2004  . Heart murmur   . History of abdominal aortic aneurysm   . History of Doppler ultrasound 05/22/2012   LEAs; R anterior tibial artery appeared occluded; L posterior tibial shows short segment of occlusive ds  . History of Doppler ultrasound 05/22/2012   Abdominal Aortic Doppler; slight increase in fusiform aneurysm   . History of echocardiogram 12/2008   EF >55%; mild concentric LVH; mild MR; mild-mod TR;  mild AV regurg;   . History of nuclear stress test 12/2010   lexiscan; low risk; compared to prior study, perfusion improved  . HTN (hypertension) 03/04/2011  . Hyperlipidemia   . Hypertension   . Hypertrophy of prostate with urinary obstruction and other lower urinary tract symptoms (LUTS) 04/28/2004  . Hyponatremia 03/04/2011  . Hypothyroidism 03/04/2011  . Impotence of organic origin 10/03/2000  . Internal hemorrhoids without mention of complication 44/03/270  . Long term (current) use of anticoagulants 03/17/2011  . Major depressive disorder, single episode, unspecified 03/05/2009  . Memory change 01/24/2013  . Muscle weakness (generalized) 03/10/2011  . Nocturia 10/27/2011  . Obstructive sleep apnea (adult) (pediatric) 07/23/2008  . Open wound of knee, leg (except thigh), and ankle, complicated 5/36/6440  . Osteoarthritis of both knees 07/31/2014  . Osteoarthrosis, unspecified whether generalized or localized, unspecified site 02/05/2003  . PAF (paroxysmal atrial fibrillation) (HCC)    coumadin  . Pain in joint, lower leg 08/04/2011  . Pruritic condition 01/24/2013  . Quadriceps weakness 07/31/2014  . Right bundle branch block 04/19/2006  . 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 Spinal stenosis in cervical region 12/15/2004  . SSS (sick sinus syndrome) ([place]) 04/08/2014  . Stroke ([place])   . Thyroid disease   . TIA (transient ischemic attack) 04/30/2013  . Type II or unspecified type diabetes mellitus without mention of complication, not stated as uncontrolled 11/24/2004  . Unspecified vitamin D deficiency 10/18/2006  . 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 Increased exposure of the right cornea.    Past Surgical History:  Procedure Laterality Date  . CORONARY ANGIOPLASTY WITH STENT PLACEMENT  2008   stent to SVG to OM  . CORONARY ARTERY BYPASS GRAFT  1989  . EYE SURGERY    . HERNIA REPAIR    . INSERT / REPLACE /  REMOVE PACEMAKER    . JOINT REPLACEMENT    . PACEMAKER INSERTION  2011    Allergies  Allergen Reactions  . Quinolones Other (See Comments)    Risk of rupture of AAA  . Caduet [Amlodipine-Atorvastatin] Other (See Comments)    Weakness   . Crestor [Rosuvastatin] Other (See Comments)    Muscle pain/weakness  . Lipitor [Atorvastatin Calcium] Other (See Comments)    Muscle pain/weakness  . Simvastatin Other (See Comments)    Muscle pain/weakness  . Norvasc [Amlodipine]   . Lisinopril Other (See Comments)    Doesn't remember reaction    Outpatient Encounter Medications as of 05/04/2018  Medication Sig  . allopurinol (ZYLOPRIM) 100 MG tablet Take 50 mg by mouth daily.   Marland Kitchen ALPRAZolam (XANAX) 0.25 MG tablet Take 1 tablet (0.25 mg total) by mouth 2 (two) times daily as needed for anxiety.  Marland Kitchen amoxicillin (AMOXIL) 500 MG tablet TAKE 4 CAPSULES =(2000 MG) BY MOUTH 1HR PRIOR TO DENTIST APPOINTMENT.  Marland Kitchen apixaban (ELIQUIS) 2.5 MG TABS tablet Take 1 tablet (2.5 mg total) by  mouth 2 (two) times daily.  . calcium carbonate (TUMS - DOSED IN MG ELEMENTAL CALCIUM) 500 MG chewable tablet Chew 1 tablet by mouth 3 (three) times daily as needed for indigestion or heartburn.   . Carboxymethylcellulose Sodium (THERATEARS OP) Place 1 drop into both eyes at bedtime as needed (DRY EYES).   . Cholecalciferol 1000 units tablet Take 2,000 Units by mouth daily.  Marland Kitchen Dextromethorphan-guaiFENesin (ROBAFEN DM) 10-100 MG/5ML liquid Give Robafen DM syrup 10 mL by mouth every 6 hours PRN for cough X 48 hours (Use sugar-free for diabetic patients.) Notify MD of continued cough.  . furosemide (LASIX) 20 MG tablet Take 20 mg by mouth daily as needed. TAKE ONE TABLET BY MOUTH AS NEEDED IF WEIGHT GAIN >3LBS IN 24HRS OR > 5LBS IN ONE WEEK. * SEE WEEKLY WEIGHTS*  . furosemide (LASIX) 20 MG tablet Take 60 mg by mouth daily.   . Humidifiers ([employer] COOL MIST HUMIDIFER) MISC by Does not apply route. RES. STATES DOES NOT USE HUMIDIFIER IN  THE SUMMER MONTHS/REQUEST THIS ORDER CHANGED TO PRN: ONCE HUMIDIFIER IS BACK IN USE, STAFF TO CHECK EVERY SHIFT/FILLED WITH WATER AS NEEDED  . ipratropium (ATROVENT) 0.06 % nasal spray Place 1 spray into both nostrils 3 (three) times daily as needed for rhinitis.  Marland Kitchen ipratropium-albuterol (DUONEB) 0.5-2.5 (3) MG/3ML SOLN Take 3 mLs by nebulization 4 (four) times daily as needed (SHORTNESS OF BREATH).   Marland Kitchen levothyroxine (SYNTHROID, LEVOTHROID) 125 MCG tablet Take 125 mcg by mouth daily before breakfast.  . Light Mineral Oil-Mineral Oil (RETAINE MGD OP) Place 1 drop into both eyes at bedtime.   Marland Kitchen losartan (COZAAR) 25 MG tablet Take 1 tablet (25 mg total) by mouth daily.  . meclizine (ANTIVERT) 25 MG tablet Take 25 mg by mouth every 6 (six) hours as needed for dizziness.   . Menthol, Topical Analgesic, (BIOFREEZE) 4 % GEL Apply 1 application topically 4 (four) times daily as needed (left knee pain).   . metoprolol tartrate (LOPRESSOR) 25 MG tablet Take 1 tablet (25 mg total) by mouth 2 (two) times daily.  Marland Kitchen neomycin-bacitracin-polymyxin (NEOSPORIN) [phone] ointment Apply 1 application topically daily as needed (both nares for dryness).   . nitroGLYCERIN (NITROSTAT) 0.4 MG SL tablet Place 0.4 mg under the tongue every 5 (five) minutes as needed for chest pain.   . polyethylene glycol (MIRALAX / GLYCOLAX) packet Take 17 g by mouth daily.  Marland Kitchen tobramycin-dexamethasone (TOBRADEX) ophthalmic ointment Place 1 application into both eyes 2 (two) times daily.  Marland Kitchen triamcinolone cream (KENALOG) 0.1 % Apply 1 application topically daily.  . [DISCONTINUED] furosemide (LASIX) 20 MG tablet Take 20 mg by mouth as needed (weight gain > 3 lbs in 24 hours or >5 lbs in a week).   No facility-administered encounter medications on file as of 05/04/2018.   ROS was provided with assistance of staff  Review of Systems  Constitutional: Positive for fatigue. Negative for activity change, appetite change, chills, diaphoresis and  fever.  HENT: Positive for hearing loss and nosebleeds. Negative for congestion, sinus pressure, sinus pain, sneezing, sore throat, trouble swallowing and voice change.   Respiratory: Positive for cough and shortness of breath. Negative for wheezing.        DOE  Cardiovascular: Positive for leg swelling. Negative for chest pain and palpitations.  Gastrointestinal: Negative for abdominal distention, abdominal pain, constipation, diarrhea, nausea and vomiting.  Genitourinary: Negative for difficulty urinating, dysuria and urgency.  Musculoskeletal: Positive for arthralgias and gait problem.  Skin: Positive for  color change.       The left 1st/2nd toe bruise.   Neurological: Negative for dizziness, speech difficulty, weakness and headaches.       Memory lapses.   Psychiatric/Behavioral: Positive for agitation, behavioral problems and sleep disturbance. Negative for confusion and hallucinations. The patient is nervous/anxious.     Immunization History  Administered Date(s) Administered  . Influenza Whole 12/06/2011, 12/19/2012, 12/07/2017  . Influenza-Unspecified 01/09/2014, 11/25/2014, 12/23/2015, 12/26/2016  . PPD Test 05/03/2013  . Pneumococcal Conjugate-13 01/03/2017  . Pneumococcal Polysaccharide-23 03/07/1986  . Td 04/20/2005  . Tdap 01/03/2017   Pertinent  Health Maintenance Due  Topic Date Due  . OPHTHALMOLOGY EXAM  01/12/1932  . FOOT EXAM  07/31/2015  . HEMOGLOBIN A1C  10/23/2017  . PNA vac Low Risk Adult (2 of 2 - PPSV23) 01/03/2018  . INFLUENZA VACCINE  Completed   Fall Risk  01/23/2018 11/21/2017 11/15/2016 10/15/2015 06/11/2015  Falls in the past year? 0 No No No Yes  Comment [place]i Telephone Survey: data to providers prior to load - - - -  Number falls in past yr: - - - - 1  Injury with Fall? - - - - Yes  Risk for fall due to : - - - - -  Follow up - - - - -   Functional Status Survey:    Vitals:   05/04/18 0939  BP: 125/70  Pulse: 85  Resp: 18  Temp: 97.8 F (36.6  C)  SpO2: 100%  Weight: 157 lb 3.2 oz (71.3 kg)  Height: '5\' 7"'$  (1.702 m)   Body mass index is 24.62 kg/m. Physical Exam Constitutional:      General: He is not in acute distress.    Appearance: Normal appearance. He is not ill-appearing, toxic-appearing or diaphoretic.  HENT:     Head: Normocephalic and atraumatic.     Nose: Nose normal.     Mouth/Throat:     Mouth: Mucous membranes are moist.  Eyes:     Extraocular Movements: Extraocular movements intact.     Pupils: Pupils are equal, round, and reactive to light.  Cardiovascular:     Rate and Rhythm: Normal rate and regular rhythm.     Heart sounds: Murmur present.  Pulmonary:     Effort: Pulmonary effort is normal.     Breath sounds: Rales present. No wheezing or rhonchi.     Comments: Bibasilar rales.  Abdominal:     General: There is no distension.     Palpations: Abdomen is soft.     Tenderness: There is no abdominal tenderness. There is no guarding or rebound.  Musculoskeletal:     Right lower leg: Edema present.     Left lower leg: Edema present.     Comments: Trace edema BLE. W/c for mobility.   Skin:    General: Skin is warm and dry.     Findings: Bruising present.     Comments: Brownish pigmented BLE. Contusion/bruise left 1st/2nd toes.   Neurological:     General: No focal deficit present.     Mental Status: He is alert. Mental status is at baseline.     Cranial Nerves: No cranial nerve deficit.     Motor: No weakness.     Coordination: Coordination normal.     Gait: Gait abnormal.     Comments: Oriented to person and place.   Psychiatric:        Mood and Affect: Mood normal.        Behavior: Behavior  normal.     Labs reviewed: Recent Labs    04/10/18 04/17/18 04/21/18 1619  NA 137 139 136  K 4.3 4.1 3.8  CL 101 100 100  CO2 '26 26 25  '$ GLUCOSE  --   --  128*  BUN 28* 33* 29*  CREATININE 1.2 1.1 1.26*  CALCIUM 9.7 9.4 9.5   Recent Labs    07/20/17 04/10/18 04/21/18 1619  AST '16 16 20    '$ ALT '11 10 14  '$ ALKPHOS 84 88 83  BILITOT  --   --  1.0  PROT  --  6.3 6.7  ALBUMIN 3.9 3.8 3.6   Recent Labs    07/20/17 04/10/18 04/21/18 1619  WBC 6.2 7.7 9.6  NEUTROABS  --   --  6.9  HGB 15.3 15.5 15.3  HCT 45 46 47.9  MCV  --   --  95.2  PLT 212 218 184   Lab Results  Component Value Date   TSH 1.35 04/10/2018   Lab Results  Component Value Date   HGBA1C 6.9 04/25/2017   Lab Results  Component Value Date   CHOL 170 07/20/2017   HDL 39 07/20/2017   LDLCALC 110 07/20/2017   TRIG 107 07/20/2017   CHOLHDL 3.9 05/01/2013    Significant Diagnostic Results in last 30 days:  Dg Chest 2 View  Result Date: 04/21/2018 CLINICAL DATA:  RIGHT lower quadrant abdominal pain EXAM: CHEST - 2 VIEW COMPARISON:  04/30/2013 FINDINGS: Sternotomy wires overlie normal cardiac silhouette. Normal pulmonary vasculature. No effusion, infiltrate, or pneumothorax. No acute osseous abnormality. LEFT-sided pacemaker. IMPRESSION: No acute cardiopulmonary process. Electronically Signed   By: Suzy Bouchard M.D.   On: 04/21/2018 17:18   Ct Abdomen Pelvis W Contrast  Result Date: 04/21/2018 CLINICAL DATA:  Abdominal pain for 1 week EXAM: CT ABDOMEN AND PELVIS WITH CONTRAST TECHNIQUE: Multidetector CT imaging of the abdomen and pelvis was performed using the standard protocol following bolus administration of intravenous contrast. CONTRAST:  122m OMNIPAQUE IOHEXOL 300 MG/ML  SOLN COMPARISON:  Lumbar CT 06/25/2014 FINDINGS: Lower chest: Mild bronchiectasis at the lung bases. Hepatobiliary: No focal hepatic lesion.  Gallbladder normal. Pancreas: Several cystic appearing lesions in the pancreas. One in the pancreatic head measures 14 mm (image 30/3). On towards the tail measures 10 mm (image 22/3). No duct dilatation. The pancreas is mildly at atrophic which is likely related to patient's age. Spleen: Normal spleen Adrenals/urinary tract: Adrenal glands and kidneys are normal. The ureters and bladder normal.  Stomach/Bowel: Stomach, small bowel, appendix, and cecum are normal. The colon and rectosigmoid colon are normal. Vascular/Lymphatic: Infrarenal abdominal aorta contains a saccular aneurysm measuring 45 mm in greatest dimension seen on sagittal image 63/7. Aneurysm measures 40 mm in transverse dimension. Heavy intimal calcification of the abdominal aorta. No lymphadenopathy Reproductive: Prostate normal Other: Small LEFT inguinal hernia is fat filled Musculoskeletal: No acute osseous abnormality. Compression deformity at T12 is chronic. Smudgy scan sclerotic lesions in the sacrum appear symmetric side-to-side. Favored benign. IMPRESSION: 1. No acute findings in the abdomen pelvis. 2. 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 Recommend followup by abdomen and pelvis CTA in 6 months, and vascular surgery referral/consultation if not already obtained. This recommendation follows ACR consensus guidelines: White Paper of the ACR Incidental Findings Committee II on Vascular Findings. J Am Coll Radiol 2013; 10:789-794. 3. Several cystic lesions in the pancreas. No pancreatic mass or duct dilatation. Favor benign cystic change. No specific follow-up recommended in this  age group. 4. Mild lung base bronchiectasis. 5. Chronic compression fracture T12 Electronically Signed   By: Suzy Bouchard M.D.   On: 04/21/2018 19:44    Assessment/Plan Recurrent mild major depressive disorder with anxiety (HCC) 05/04/18 Sertraline 12.'5mg'$  qd, continue Xanax bid prn for sleep    Toe contusion Left 1st and 2nd toes from bumping wc to door frame. It should heal.   Atrial fibrillation ([place]) Heart rate is in control, continue Metoprolol '25mg'$  bid.   Epistaxis, recurrent Recurrent, will update CBC/diff in setting of s/p PNA/cough.   Long term current use of anticoagulant therapy Continue Eliquis for Afib/s/p CVA     Family/ staff Communication: plan of care reviewed with the patient and charge nurse.    Labs/tests ordered:  CBC/diff  Time spend 25 minutes.

## 2018-05-04 NOTE — Assessment & Plan Note (Signed)
Left 1st and 2nd toes from bumping wc to door frame. It should heal.

## 2018-05-04 NOTE — Assessment & Plan Note (Signed)
Continue Eliquis for Afib/s/p CVA

## 2018-05-04 NOTE — Assessment & Plan Note (Signed)
Recurrent, will update CBC/diff in setting of s/p PNA/cough.

## 2018-05-04 NOTE — Assessment & Plan Note (Signed)
05/04/18 Sertraline 12.5mg  qd, continue Xanax bid prn for sleep

## 2018-05-04 NOTE — Assessment & Plan Note (Signed)
Heart rate is in control, continue Metoprolol 25mg bid.  

## 2018-05-07 DIAGNOSIS — I259 Chronic ischemic heart disease, unspecified: Secondary | ICD-10-CM | POA: Diagnosis not present

## 2018-05-07 DIAGNOSIS — I10 Essential (primary) hypertension: Secondary | ICD-10-CM | POA: Diagnosis not present

## 2018-05-07 DIAGNOSIS — R1312 Dysphagia, oropharyngeal phase: Secondary | ICD-10-CM | POA: Diagnosis not present

## 2018-05-07 DIAGNOSIS — E559 Vitamin D deficiency, unspecified: Secondary | ICD-10-CM | POA: Diagnosis not present

## 2018-05-07 DIAGNOSIS — I69954 Hemiplegia and hemiparesis following unspecified cerebrovascular disease affecting left non-dominant side: Secondary | ICD-10-CM | POA: Diagnosis not present

## 2018-05-07 DIAGNOSIS — R471 Dysarthria and anarthria: Secondary | ICD-10-CM | POA: Diagnosis not present

## 2018-05-07 DIAGNOSIS — F419 Anxiety disorder, unspecified: Secondary | ICD-10-CM | POA: Diagnosis not present

## 2018-05-07 DIAGNOSIS — I4891 Unspecified atrial fibrillation: Secondary | ICD-10-CM | POA: Diagnosis not present

## 2018-05-09 ENCOUNTER — Encounter: Payer: Self-pay | Admitting: Nurse Practitioner

## 2018-05-09 ENCOUNTER — Non-Acute Institutional Stay: Payer: Medicare Other | Admitting: Nurse Practitioner

## 2018-05-09 DIAGNOSIS — F330 Major depressive disorder, recurrent, mild: Secondary | ICD-10-CM | POA: Diagnosis not present

## 2018-05-09 DIAGNOSIS — F419 Anxiety disorder, unspecified: Secondary | ICD-10-CM

## 2018-05-09 DIAGNOSIS — E876 Hypokalemia: Secondary | ICD-10-CM | POA: Diagnosis not present

## 2018-05-09 DIAGNOSIS — M6281 Muscle weakness (generalized): Secondary | ICD-10-CM | POA: Diagnosis not present

## 2018-05-09 DIAGNOSIS — D649 Anemia, unspecified: Secondary | ICD-10-CM | POA: Diagnosis not present

## 2018-05-09 DIAGNOSIS — N179 Acute kidney failure, unspecified: Secondary | ICD-10-CM | POA: Insufficient documentation

## 2018-05-09 DIAGNOSIS — I4821 Permanent atrial fibrillation: Secondary | ICD-10-CM

## 2018-05-09 DIAGNOSIS — I10 Essential (primary) hypertension: Secondary | ICD-10-CM | POA: Diagnosis not present

## 2018-05-09 DIAGNOSIS — R05 Cough: Secondary | ICD-10-CM | POA: Diagnosis not present

## 2018-05-09 DIAGNOSIS — I5022 Chronic systolic (congestive) heart failure: Secondary | ICD-10-CM

## 2018-05-09 DIAGNOSIS — J690 Pneumonitis due to inhalation of food and vomit: Secondary | ICD-10-CM

## 2018-05-09 DIAGNOSIS — N189 Chronic kidney disease, unspecified: Secondary | ICD-10-CM

## 2018-05-09 DIAGNOSIS — W19XXXA Unspecified fall, initial encounter: Secondary | ICD-10-CM

## 2018-05-09 DIAGNOSIS — E039 Hypothyroidism, unspecified: Secondary | ICD-10-CM | POA: Diagnosis not present

## 2018-05-09 NOTE — Assessment & Plan Note (Addendum)
05/09/18 Na 133, K 4.7, Bun 62, creat 2.07, CXR showed no evidence of acute pulmonary disease. Hold Furosemide x3 days, update BMP 05/10/2018, encourage oral fluid intake 274ml q2-3hours while awake. May ED eval and treat if no better.

## 2018-05-09 NOTE — Assessment & Plan Note (Signed)
more DOE noted, on Furosemide 60mg  qd, 20mg  prn. Update CXR ap view, f/u cardiology as needed.

## 2018-05-09 NOTE — Assessment & Plan Note (Signed)
Multiple factorials, recent treated PNA, Hx of CHF and CVA, advanced age of 34. Will update CBC/diff, CMP, CXR ap view. The patient needs higher level of care for safety and care assistance.

## 2018-05-09 NOTE — Assessment & Plan Note (Signed)
heart rate is in control, continue Metoprolol 25mg  bid, Eliquis 2.5mg  bid.

## 2018-05-09 NOTE — Assessment & Plan Note (Signed)
fall w/o apparent injury at 3am when the patient was found sitting beside recliner, he stated he slide out of chair when he attempted to transfer self for toileting. He denied dizziness, headache, change of vision, chest pain/pressure/palpitation, or new focal weakness. Lack of safety awareness, negative side effect of Alprazolam, increased frailty are contributory. The patient needs close supervision and assistance for safety.

## 2018-05-09 NOTE — Progress Notes (Addendum)
Location:  [place] Room Number: Tea of Service:  ALF [phone]) Provider:  Marlana Latus  NP  Abimelec Grochowski X, NP  Patient Care Team: Jessyca Sloan X, NP as PCP - General (Internal Medicine) [place], Nadean Corwin, MD as Consulting Physician (Cardiology) Ardis Hughs, MD as Attending Physician (Urology) Daltyn Degroat X, NP as Nurse Practitioner (Internal Medicine)  Extended Emergency Contact Information Primary Emergency Contact: [name] of [place] Phone: [phone] Relation: Daughter Secondary Emergency Contact: Eyvonne Mechanic [place] of [place] Phone: [phone] Mobile Phone: [phone] Relation: Daughter  Code Status:  DNR Goals of care: Advanced Directive information Advanced Directives 05/09/2018  Does Patient Have a Medical Advance Directive? Yes  Type of Advance Directive Out of facility DNR (pink MOST or yellow form);Healthcare Power of Attorney  Does patient want to make changes to medical advance directive? No - Patient declined  Copy of [place] in Chart? Yes - validated most recent copy scanned in chart (See row information)  Pre-existing out of facility DNR order (yellow form or pink MOST form) Yellow form placed in chart (order not valid for inpatient use)     Chief Complaint  Patient presents with  . Acute Visit    C/o- SOB, fall    HPI:  Pt is a 85 y.o. male seen today for an acute visit for fall w/o apparent injury at 3am when the patient was found sitting beside recliner, he stated he slide out of chair when he attempted to transfer self for toileting. He denied dizziness, headache, change of vision, chest pain/pressure/palpitation, or new focal weakness. Anxiety, self discontinued Sertraline, prn Alprazolam 0.'25mg'$  bid is available to him, but apparently it makes him sleepy. Hx of CHF, more DOE noted, on Furosemide '60mg'$  qd, '20mg'$  prn. Afib, heart rate is  in controlled on Metoprolol '25mg'$  bid, Eliquis 2.'5mg'$  bid. HTN blood pressure is controlled on Losartan '25mg'$  qd, Metoprolol '25mg'$  bid.    Past Medical History:  Diagnosis Date  . Abnormal PFTs 10/30/2012   Followed in Pulmonary clinic/ [facility]/ [place]  - PFT's 10/22/12 VC  55% no obst, DLCO 50%  - PFT's 12/20/2012 VC 83% and no obst, dLCO 55%  -11/08/2012  Walked RA x 3 laps @ 185 ft each stopped due to  End of study, not desat -11/08/12 esr 10    . Abnormality of gait 04/18/2013  . Acute on chronic systolic congestive heart failure, NYHA class 2 ([place]) 03/16/2006   BNP 376.9 05/28/13   . Adjustment disorder with mixed anxiety and depressed mood 04/18/2013   Post CVA depression and anxiety   . Allergy   . Anxiety   . Aortic aneurysm of unspecified site without mention of rupture 10/27/2011  . Arthritis   . Atherosclerosis of renal artery ([place]) 10/18/2006  . Atrial fibrillation ([place]) 03/04/2011    He was on coumadin until his stroke last December and was switched to apixaban 2.'5mg'$  daily which he has been taking faithfully without reported side effects.    . Balance disorder 07/31/2014  . Basal cell carcinoma of skin of other and unspecified parts of face 12/24/2009  . Blood transfusion without reported diagnosis   . Cardiac pacemaker in situ- MDT 10/11 03/18/2010   Medtronic revo implant in October 2011. Severe sinus bradycardia in the 30s, but not truly pacemaker dependent   . Cataract   . CKD stage 2 due to type 2 diabetes mellitus ([place]) 07/03/2014  .  Coronary artery disease   . CVA - Rt brain stroke 12/14 02/18/2013   02/19/13 angiography CT head:  Diffuse atherosclerotic irregularity and plaque formation of the distal right common carotid artery and proximal internal carotid artery without significant stenosis. Plaque ulceration is present and is a source of emboli. A small right thalamic CVA    . DM (diabetes mellitus), type 2 with renal complications ([place]) 1/44/3154  . Dyslipidemia 11/03/2004   . Fall 03/04/2011  . Fatigue 04/18/2013  . Gout, unspecified 11/03/2004  . Heart murmur   . History of abdominal aortic aneurysm   . History of Doppler ultrasound 05/22/2012   LEAs; R anterior tibial artery appeared occluded; L posterior tibial shows short segment of occlusive ds  . History of Doppler ultrasound 05/22/2012   Abdominal Aortic Doppler; slight increase in fusiform aneurysm   . History of echocardiogram 12/2008   EF >55%; mild concentric LVH; mild MR; mild-mod TR; mild AV regurg;   . History of nuclear stress test 12/2010   lexiscan; low risk; compared to prior study, perfusion improved  . HTN (hypertension) 03/04/2011  . Hyperlipidemia   . Hypertension   . Hypertrophy of prostate with urinary obstruction and other lower urinary tract symptoms (LUTS) 04/28/2004  . Hyponatremia 03/04/2011  . Hypothyroidism 03/04/2011  . Impotence of organic origin 10/03/2000  . Internal hemorrhoids without mention of complication 00/86/7619  . Long term (current) use of anticoagulants 03/17/2011  . Major depressive disorder, single episode, unspecified 03/05/2009  . Memory change 01/24/2013  . Muscle weakness (generalized) 03/10/2011  . Nocturia 10/27/2011  . Obstructive sleep apnea (adult) (pediatric) 07/23/2008  . Open wound of knee, leg (except thigh), and ankle, complicated 07/13/3265  . Osteoarthritis of both knees 07/31/2014  . Osteoarthrosis, unspecified whether generalized or localized, unspecified site 02/05/2003  . PAF (paroxysmal atrial fibrillation) (HCC)    coumadin  . Pain in joint, lower leg 08/04/2011  . Pruritic condition 01/24/2013  . Quadriceps weakness 07/31/2014  . Right bundle branch block 04/19/2006  . 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 Spinal stenosis in cervical region 12/15/2004  . SSS (sick sinus syndrome) ([place]) 04/08/2014  . Stroke ([place])   . Thyroid disease   . TIA (transient ischemic attack) 04/30/2013  . Type II  or unspecified type diabetes mellitus without mention of complication, not stated as uncontrolled 11/24/2004  . Unspecified vitamin D deficiency 10/18/2006  . 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 Increased exposure of the right cornea.    Past Surgical History:  Procedure Laterality Date  . CORONARY ANGIOPLASTY WITH STENT PLACEMENT  2008   stent to SVG to OM  . CORONARY ARTERY BYPASS GRAFT  1989  . EYE SURGERY    . HERNIA REPAIR    . INSERT / REPLACE / REMOVE PACEMAKER    . JOINT REPLACEMENT    . PACEMAKER INSERTION  2011    Allergies  Allergen Reactions  . Quinolones Other (See Comments)    Risk of rupture of AAA  . Caduet [Amlodipine-Atorvastatin] Other (See Comments)    Weakness   . Crestor [Rosuvastatin] Other (See Comments)    Muscle pain/weakness  . Lipitor [Atorvastatin Calcium] Other (See Comments)    Muscle pain/weakness  . Simvastatin Other (See Comments)    Muscle pain/weakness  . Norvasc [Amlodipine]   . Lisinopril Other (See Comments)    Doesn't remember reaction    Outpatient Encounter Medications as  of 05/09/2018  Medication Sig  . allopurinol (ZYLOPRIM) 100 MG tablet Take 50 mg by mouth daily.   Marland Kitchen ALPRAZolam (XANAX) 0.25 MG tablet Take 1 tablet (0.25 mg total) by mouth 2 (two) times daily as needed for anxiety.  Marland Kitchen amoxicillin (AMOXIL) 500 MG tablet as needed. TAKE 4 CAPSULES =(2000 MG) BY MOUTH 1HR PRIOR TO DENTIST APPOINTMENT.   Marland Kitchen apixaban (ELIQUIS) 2.5 MG TABS tablet Take 1 tablet (2.5 mg total) by mouth 2 (two) times daily.  . calcium carbonate (TUMS - DOSED IN MG ELEMENTAL CALCIUM) 500 MG chewable tablet Chew 1 tablet by mouth 3 (three) times daily as needed for indigestion or heartburn.   . Carboxymethylcellulose Sodium (THERATEARS OP) Place 1 drop into both eyes at bedtime as needed (DRY EYES).   . Cholecalciferol 1000 units tablet Take 2,000 Units by mouth daily.  Marland Kitchen Dextromethorphan-guaiFENesin (ROBAFEN DM) 10-100 MG/5ML liquid  Give Robafen DM syrup 10 mL by mouth every 6 hours PRN for cough X 48 hours (Use sugar-free for diabetic patients.) Notify MD of continued cough.  . furosemide (LASIX) 20 MG tablet Take 20 mg by mouth daily as needed. TAKE ONE TABLET BY MOUTH AS NEEDED IF WEIGHT GAIN >3LBS IN 24HRS OR > 5LBS IN ONE WEEK. * SEE WEEKLY WEIGHTS*  . furosemide (LASIX) 20 MG tablet Take 60 mg by mouth daily.   . Humidifiers ([employer] COOL MIST HUMIDIFER) MISC by Does not apply route. RES. STATES DOES NOT USE HUMIDIFIER IN THE SUMMER MONTHS/REQUEST THIS ORDER CHANGED TO PRN: ONCE HUMIDIFIER IS BACK IN USE, STAFF TO CHECK EVERY SHIFT/FILLED WITH WATER AS NEEDED  . ipratropium (ATROVENT) 0.06 % nasal spray Place 1 spray into both nostrils 3 (three) times daily as needed for rhinitis.  Marland Kitchen ipratropium-albuterol (DUONEB) 0.5-2.5 (3) MG/3ML SOLN Take 3 mLs by nebulization 4 (four) times daily as needed (SHORTNESS OF BREATH).   Marland Kitchen levothyroxine (SYNTHROID, LEVOTHROID) 125 MCG tablet Take 125 mcg by mouth daily before breakfast.  . Light Mineral Oil-Mineral Oil (RETAINE MGD OP) Place 1 drop into both eyes at bedtime.   Marland Kitchen losartan (COZAAR) 25 MG tablet Take 1 tablet (25 mg total) by mouth daily.  . meclizine (ANTIVERT) 25 MG tablet Take 25 mg by mouth every 6 (six) hours as needed for dizziness.   . Menthol, Topical Analgesic, (BIOFREEZE) 4 % GEL Apply 1 application topically 4 (four) times daily as needed (left knee pain).   . metoprolol tartrate (LOPRESSOR) 25 MG tablet Take 1 tablet (25 mg total) by mouth 2 (two) times daily.  Marland Kitchen neomycin-bacitracin-polymyxin (NEOSPORIN) [phone] ointment Apply 1 application topically daily as needed (both nares for dryness).   . nitroGLYCERIN (NITROSTAT) 0.4 MG SL tablet Place 0.4 mg under the tongue every 5 (five) minutes as needed for chest pain.   . polyethylene glycol (MIRALAX / GLYCOLAX) packet Take 17 g by mouth daily.  Marland Kitchen tobramycin-dexamethasone (TOBRADEX) ophthalmic ointment Place 1  application into both eyes 2 (two) times daily.  Marland Kitchen triamcinolone cream (KENALOG) 0.1 % Apply 1 application topically daily.   No facility-administered encounter medications on file as of 05/09/2018.    ROS was provided with assistance of staff, the patient's HPOA daughter Review of Systems  Constitutional: Positive for activity change, appetite change and fatigue. Negative for chills, diaphoresis and fever.  HENT: Positive for hearing loss. Negative for congestion and voice change.   Respiratory: Positive for cough and shortness of breath. Negative for wheezing.   Cardiovascular: Positive for leg swelling. Negative for chest  pain and palpitations.  Gastrointestinal: Negative for abdominal distention, abdominal pain, constipation, diarrhea, nausea and vomiting.  Genitourinary: Negative for difficulty urinating, dysuria and urgency.  Musculoskeletal: Positive for gait problem.  Skin: Positive for color change.  Neurological: Negative for dizziness, seizures, facial asymmetry, speech difficulty, weakness and headaches.       Memory lapses.   Psychiatric/Behavioral: Positive for sleep disturbance. Negative for agitation, behavioral problems and hallucinations. The patient is nervous/anxious.     Immunization History  Administered Date(s) Administered  . Influenza Whole 12/06/2011, 12/19/2012, 12/07/2017  . Influenza-Unspecified 01/09/2014, 11/25/2014, 12/23/2015, 12/26/2016  . PPD Test 05/03/2013  . Pneumococcal Conjugate-13 01/03/2017  . Pneumococcal Polysaccharide-23 03/07/1986  . Td 04/20/2005  . Tdap 01/03/2017   Pertinent  Health Maintenance Due  Topic Date Due  . OPHTHALMOLOGY EXAM  01/12/1932  . FOOT EXAM  07/31/2015  . HEMOGLOBIN A1C  10/23/2017  . PNA vac Low Risk Adult (2 of 2 - PPSV23) 01/03/2018  . INFLUENZA VACCINE  Completed   Fall Risk  01/23/2018 11/21/2017 11/15/2016 10/15/2015 06/11/2015  Falls in the past year? 0 No No No Yes  Comment Emmi Telephone Survey: data to  providers prior to load - - - -  Number falls in past yr: - - - - 1  Injury with Fall? - - - - Yes  Risk for fall due to : - - - - -  Follow up - - - - -   Functional Status Survey:    Vitals:   05/09/18 0940  BP: 118/62  Pulse: 65  Resp: 18  Temp: (!) 96.8 F (36 C)  SpO2: 97%  Weight: 157 lb 3.2 oz (71.3 kg)  Height: '5\' 8"'$  (1.727 m)   Body mass index is 23.9 kg/m. Physical Exam Constitutional:      General: He is not in acute distress.    Appearance: Normal appearance. He is not ill-appearing, toxic-appearing or diaphoretic.  HENT:     Head: Normocephalic and atraumatic.     Nose: Nose normal.     Mouth/Throat:     Mouth: Mucous membranes are moist.  Eyes:     Pupils: Pupils are equal, round, and reactive to light.  Neck:     Musculoskeletal: Normal range of motion and neck supple.  Cardiovascular:     Rate and Rhythm: Normal rate and regular rhythm.     Heart sounds: Murmur present.  Pulmonary:     Breath sounds: No wheezing or rhonchi.     Comments: Bibasilar rales.  Abdominal:     General: There is no distension.     Palpations: Abdomen is soft.     Tenderness: There is no abdominal tenderness. There is no guarding or rebound.  Musculoskeletal:     Right lower leg: Edema present.     Left lower leg: Edema present.     Comments: Trace edema BLE.   Skin:    General: Skin is warm and dry.  Neurological:     General: No focal deficit present.     Mental Status: He is alert. Mental status is at baseline.     Motor: No weakness.     Coordination: Coordination normal.     Gait: Gait abnormal.     Comments: Oriented to person and place.   Psychiatric:        Mood and Affect: Mood normal.        Behavior: Behavior normal.     Labs reviewed: Recent Labs    04/10/18 04/17/18  04/21/18 1619  NA 137 139 136  K 4.3 4.1 3.8  CL 101 100 100  CO2 '26 26 25  '$ GLUCOSE  --   --  128*  BUN 28* 33* 29*  CREATININE 1.2 1.1 1.26*  CALCIUM 9.7 9.4 9.5   Recent  Labs    07/20/17 04/10/18 04/21/18 1619  AST '16 16 20  '$ ALT '11 10 14  '$ ALKPHOS 84 88 83  BILITOT  --   --  1.0  PROT  --  6.3 6.7  ALBUMIN 3.9 3.8 3.6   Recent Labs    04/10/18 04/21/18 1619 05/04/18  WBC 7.7 9.6 8.0  NEUTROABS  --  6.9  --   HGB 15.5 15.3 15.2  HCT 46 47.9 44  MCV  --  95.2  --   PLT 218 184 196   Lab Results  Component Value Date   TSH 1.35 04/10/2018   Lab Results  Component Value Date   HGBA1C 6.9 04/25/2017   Lab Results  Component Value Date   CHOL 170 07/20/2017   HDL 39 07/20/2017   LDLCALC 110 07/20/2017   TRIG 107 07/20/2017   CHOLHDL 3.9 05/01/2013    Significant Diagnostic Results in last 30 days:  Dg Chest 2 View  Result Date: 04/21/2018 CLINICAL DATA:  RIGHT lower quadrant abdominal pain EXAM: CHEST - 2 VIEW COMPARISON:  04/30/2013 FINDINGS: Sternotomy wires overlie normal cardiac silhouette. Normal pulmonary vasculature. No effusion, infiltrate, or pneumothorax. No acute osseous abnormality. LEFT-sided pacemaker. IMPRESSION: No acute cardiopulmonary process. Electronically Signed   By: Suzy Bouchard M.D.   On: 04/21/2018 17:18   Ct Abdomen Pelvis W Contrast  Result Date: 04/21/2018 CLINICAL DATA:  Abdominal pain for 1 week EXAM: CT ABDOMEN AND PELVIS WITH CONTRAST TECHNIQUE: Multidetector CT imaging of the abdomen and pelvis was performed using the standard protocol following bolus administration of intravenous contrast. CONTRAST:  148m OMNIPAQUE IOHEXOL 300 MG/ML  SOLN COMPARISON:  Lumbar CT 06/25/2014 FINDINGS: Lower chest: Mild bronchiectasis at the lung bases. Hepatobiliary: No focal hepatic lesion.  Gallbladder normal. Pancreas: Several cystic appearing lesions in the pancreas. One in the pancreatic head measures 14 mm (image 30/3). On towards the tail measures 10 mm (image 22/3). No duct dilatation. The pancreas is mildly at atrophic which is likely related to patient's age. Spleen: Normal spleen Adrenals/urinary tract: Adrenal  glands and kidneys are normal. The ureters and bladder normal. Stomach/Bowel: Stomach, small bowel, appendix, and cecum are normal. The colon and rectosigmoid colon are normal. Vascular/Lymphatic: Infrarenal abdominal aorta contains a saccular aneurysm measuring 45 mm in greatest dimension seen on sagittal image 63/7. Aneurysm measures 40 mm in transverse dimension. Heavy intimal calcification of the abdominal aorta. No lymphadenopathy Reproductive: Prostate normal Other: Small LEFT inguinal hernia is fat filled Musculoskeletal: No acute osseous abnormality. Compression deformity at T12 is chronic. Smudgy scan sclerotic lesions in the sacrum appear symmetric side-to-side. Favored benign. IMPRESSION: 1. No acute findings in the abdomen pelvis. 2. 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 Recommend followup by abdomen and pelvis CTA in 6 months, and vascular surgery referral/consultation if not already obtained. This recommendation follows ACR consensus guidelines: White Paper of the ACR Incidental Findings Committee II on Vascular Findings. J Am Coll Radiol 2013; 10:789-794. 3. Several cystic lesions in the pancreas. No pancreatic mass or duct dilatation. Favor benign cystic change. No specific follow-up recommended in this age group. 4. Mild lung base bronchiectasis. 5. Chronic compression fracture T12 Electronically Signed  By: Suzy Bouchard M.D.   On: 04/21/2018 19:44    Assessment/Plan Muscle weakness (generalized) Multiple factorials, recent treated PNA, Hx of CHF and CVA, advanced age of 63. Will update CBC/diff, CMP, CXR ap view. The patient needs higher level of care for safety and care assistance.   Fall  fall w/o apparent injury at 3am when the patient was found sitting beside recliner, he stated he slide out of chair when he attempted to transfer self for toileting. He denied dizziness, headache, change of vision, chest pain/pressure/palpitation, or new focal weakness. Lack  of safety awareness, negative side effect of Alprazolam, increased frailty are contributory. The patient needs close supervision and assistance for safety.   Recurrent mild major depressive disorder with anxiety ([place]) self discontinued Sertraline, prn Alprazolam 0.'25mg'$  bid is available to him, but apparently it makes him sleepy. Close supervision needed for safety.   Chronic systolic congestive heart failure (HCC) more DOE noted, on Furosemide '60mg'$  qd, '20mg'$  prn. Update CXR ap view, f/u cardiology as needed.   Atrial fibrillation (HCC) heart rate is in control, continue Metoprolol '25mg'$  bid, Eliquis 2.'5mg'$  bid.  HTN (hypertension) blood pressure is controlled, continue Losartan '25mg'$  qd, Metoprolol '25mg'$  bid.   Aspiration pneumonia ([place]) Fully treated, occasional cough noted, update CXR  Acute kidney injury superimposed on chronic kidney disease ([place]) 05/09/18 Na 133, K 4.7, Bun 62, creat 2.07, CXR showed no evidence of acute pulmonary disease. Hold Furosemide x3 days, update BMP 05/27/2018, encourage oral fluid intake 280m q2-3hours while awake. May ED eval and treat if no better.      Family/ staff Communication: plan of care reviewed with the patient, the patient's HPOA daughter, and charge nurse.   Labs/tests ordered:  CXR ap view, CBC/diff, CMP  Time spend 25 minutes.

## 2018-05-09 NOTE — Assessment & Plan Note (Signed)
Fully treated, occasional cough noted, update CXR

## 2018-05-09 NOTE — Assessment & Plan Note (Signed)
blood pressure is controlled, continue Losartan 25mg  qd, Metoprolol 25mg  bid.

## 2018-05-09 NOTE — Assessment & Plan Note (Signed)
self discontinued Sertraline, prn Alprazolam 0.25mg  bid is available to him, but apparently it makes him sleepy. Close supervision needed for safety.

## 2018-05-10 ENCOUNTER — Telehealth: Payer: Self-pay | Admitting: Cardiovascular Disease

## 2018-05-10 DIAGNOSIS — R471 Dysarthria and anarthria: Secondary | ICD-10-CM | POA: Diagnosis not present

## 2018-05-10 DIAGNOSIS — I259 Chronic ischemic heart disease, unspecified: Secondary | ICD-10-CM | POA: Diagnosis not present

## 2018-05-10 DIAGNOSIS — F4323 Adjustment disorder with mixed anxiety and depressed mood: Secondary | ICD-10-CM | POA: Diagnosis not present

## 2018-05-10 DIAGNOSIS — R1312 Dysphagia, oropharyngeal phase: Secondary | ICD-10-CM | POA: Diagnosis not present

## 2018-05-10 DIAGNOSIS — E559 Vitamin D deficiency, unspecified: Secondary | ICD-10-CM | POA: Diagnosis not present

## 2018-05-10 DIAGNOSIS — F419 Anxiety disorder, unspecified: Secondary | ICD-10-CM | POA: Diagnosis not present

## 2018-05-10 DIAGNOSIS — I4891 Unspecified atrial fibrillation: Secondary | ICD-10-CM | POA: Diagnosis not present

## 2018-05-10 NOTE — Telephone Encounter (Signed)
Called patient daughter, advised of all of his issues. The main concern was the medications, they have changed them around and taken him off the Lasix and they just want the Cardiologist to be made aware as well as a few questions. I did have an appointment for [facility] PA-C on 03/17. Patient daughter verbalized understanding.

## 2018-05-10 NOTE — Telephone Encounter (Signed)
New Message   Pts daughter is calling because the Pt is in assisted living and they are also faxing his lab results.  Pts daughter says the pt is having consuion, and body shaking. She also states he has had SOB  They would like to schedule him an appt for as soon as possible Please call back

## 2018-05-11 ENCOUNTER — Emergency Department (HOSPITAL_COMMUNITY): Payer: Medicare Other

## 2018-05-11 ENCOUNTER — Other Ambulatory Visit: Payer: Self-pay

## 2018-05-11 ENCOUNTER — Encounter (HOSPITAL_COMMUNITY): Payer: Self-pay | Admitting: Emergency Medicine

## 2018-05-11 ENCOUNTER — Inpatient Hospital Stay (HOSPITAL_COMMUNITY)
Admission: EM | Admit: 2018-05-11 | Discharge: 2018-06-06 | DRG: 683 | Disposition: E | Payer: Medicare Other | Source: Skilled Nursing Facility | Attending: Internal Medicine | Admitting: Internal Medicine

## 2018-05-11 DIAGNOSIS — R05 Cough: Secondary | ICD-10-CM | POA: Diagnosis not present

## 2018-05-11 DIAGNOSIS — Z7901 Long term (current) use of anticoagulants: Secondary | ICD-10-CM | POA: Diagnosis not present

## 2018-05-11 DIAGNOSIS — I10 Essential (primary) hypertension: Secondary | ICD-10-CM | POA: Diagnosis not present

## 2018-05-11 DIAGNOSIS — Z833 Family history of diabetes mellitus: Secondary | ICD-10-CM

## 2018-05-11 DIAGNOSIS — K219 Gastro-esophageal reflux disease without esophagitis: Secondary | ICD-10-CM | POA: Diagnosis present

## 2018-05-11 DIAGNOSIS — Z8673 Personal history of transient ischemic attack (TIA), and cerebral infarction without residual deficits: Secondary | ICD-10-CM

## 2018-05-11 DIAGNOSIS — E1151 Type 2 diabetes mellitus with diabetic peripheral angiopathy without gangrene: Secondary | ICD-10-CM | POA: Diagnosis not present

## 2018-05-11 DIAGNOSIS — E039 Hypothyroidism, unspecified: Secondary | ICD-10-CM | POA: Diagnosis not present

## 2018-05-11 DIAGNOSIS — R0602 Shortness of breath: Secondary | ICD-10-CM | POA: Diagnosis not present

## 2018-05-11 DIAGNOSIS — I248 Other forms of acute ischemic heart disease: Secondary | ICD-10-CM | POA: Diagnosis not present

## 2018-05-11 DIAGNOSIS — N179 Acute kidney failure, unspecified: Secondary | ICD-10-CM | POA: Diagnosis not present

## 2018-05-11 DIAGNOSIS — I959 Hypotension, unspecified: Secondary | ICD-10-CM | POA: Diagnosis not present

## 2018-05-11 DIAGNOSIS — E1129 Type 2 diabetes mellitus with other diabetic kidney complication: Secondary | ICD-10-CM | POA: Diagnosis present

## 2018-05-11 DIAGNOSIS — F419 Anxiety disorder, unspecified: Secondary | ICD-10-CM | POA: Diagnosis present

## 2018-05-11 DIAGNOSIS — G4730 Sleep apnea, unspecified: Secondary | ICD-10-CM | POA: Diagnosis present

## 2018-05-11 DIAGNOSIS — R531 Weakness: Secondary | ICD-10-CM | POA: Diagnosis not present

## 2018-05-11 DIAGNOSIS — Z0189 Encounter for other specified special examinations: Secondary | ICD-10-CM | POA: Diagnosis not present

## 2018-05-11 DIAGNOSIS — E1122 Type 2 diabetes mellitus with diabetic chronic kidney disease: Secondary | ICD-10-CM | POA: Diagnosis present

## 2018-05-11 DIAGNOSIS — A419 Sepsis, unspecified organism: Secondary | ICD-10-CM | POA: Diagnosis not present

## 2018-05-11 DIAGNOSIS — I4821 Permanent atrial fibrillation: Secondary | ICD-10-CM | POA: Diagnosis not present

## 2018-05-11 DIAGNOSIS — E1159 Type 2 diabetes mellitus with other circulatory complications: Secondary | ICD-10-CM | POA: Diagnosis present

## 2018-05-11 DIAGNOSIS — Z955 Presence of coronary angioplasty implant and graft: Secondary | ICD-10-CM

## 2018-05-11 DIAGNOSIS — E860 Dehydration: Secondary | ICD-10-CM | POA: Diagnosis present

## 2018-05-11 DIAGNOSIS — R7989 Other specified abnormal findings of blood chemistry: Secondary | ICD-10-CM | POA: Diagnosis not present

## 2018-05-11 DIAGNOSIS — E871 Hypo-osmolality and hyponatremia: Secondary | ICD-10-CM | POA: Diagnosis not present

## 2018-05-11 DIAGNOSIS — N182 Chronic kidney disease, stage 2 (mild): Secondary | ICD-10-CM | POA: Diagnosis present

## 2018-05-11 DIAGNOSIS — I2510 Atherosclerotic heart disease of native coronary artery without angina pectoris: Secondary | ICD-10-CM | POA: Diagnosis present

## 2018-05-11 DIAGNOSIS — I5022 Chronic systolic (congestive) heart failure: Secondary | ICD-10-CM | POA: Diagnosis present

## 2018-05-11 DIAGNOSIS — E1121 Type 2 diabetes mellitus with diabetic nephropathy: Secondary | ICD-10-CM | POA: Diagnosis not present

## 2018-05-11 DIAGNOSIS — Z515 Encounter for palliative care: Secondary | ICD-10-CM | POA: Diagnosis present

## 2018-05-11 DIAGNOSIS — E785 Hyperlipidemia, unspecified: Secondary | ICD-10-CM | POA: Diagnosis present

## 2018-05-11 DIAGNOSIS — I6782 Cerebral ischemia: Secondary | ICD-10-CM | POA: Diagnosis not present

## 2018-05-11 DIAGNOSIS — I130 Hypertensive heart and chronic kidney disease with heart failure and stage 1 through stage 4 chronic kidney disease, or unspecified chronic kidney disease: Secondary | ICD-10-CM | POA: Diagnosis not present

## 2018-05-11 DIAGNOSIS — I4891 Unspecified atrial fibrillation: Secondary | ICD-10-CM | POA: Diagnosis not present

## 2018-05-11 DIAGNOSIS — R6510 Systemic inflammatory response syndrome (SIRS) of non-infectious origin without acute organ dysfunction: Secondary | ICD-10-CM | POA: Diagnosis not present

## 2018-05-11 DIAGNOSIS — Z7189 Other specified counseling: Secondary | ICD-10-CM

## 2018-05-11 DIAGNOSIS — I5082 Biventricular heart failure: Secondary | ICD-10-CM | POA: Diagnosis present

## 2018-05-11 DIAGNOSIS — Z950 Presence of cardiac pacemaker: Secondary | ICD-10-CM

## 2018-05-11 DIAGNOSIS — Z79890 Hormone replacement therapy (postmenopausal): Secondary | ICD-10-CM

## 2018-05-11 DIAGNOSIS — Z8669 Personal history of other diseases of the nervous system and sense organs: Secondary | ICD-10-CM

## 2018-05-11 DIAGNOSIS — Z951 Presence of aortocoronary bypass graft: Secondary | ICD-10-CM

## 2018-05-11 DIAGNOSIS — S299XXA Unspecified injury of thorax, initial encounter: Secondary | ICD-10-CM | POA: Diagnosis not present

## 2018-05-11 DIAGNOSIS — Z9181 History of falling: Secondary | ICD-10-CM

## 2018-05-11 DIAGNOSIS — I495 Sick sinus syndrome: Secondary | ICD-10-CM | POA: Diagnosis present

## 2018-05-11 DIAGNOSIS — Z87891 Personal history of nicotine dependence: Secondary | ICD-10-CM

## 2018-05-11 DIAGNOSIS — J449 Chronic obstructive pulmonary disease, unspecified: Secondary | ICD-10-CM | POA: Diagnosis present

## 2018-05-11 DIAGNOSIS — R0902 Hypoxemia: Secondary | ICD-10-CM | POA: Diagnosis not present

## 2018-05-11 DIAGNOSIS — Z66 Do not resuscitate: Secondary | ICD-10-CM | POA: Diagnosis present

## 2018-05-11 DIAGNOSIS — N189 Chronic kidney disease, unspecified: Secondary | ICD-10-CM

## 2018-05-11 LAB — CBC WITH DIFFERENTIAL/PLATELET
Abs Immature Granulocytes: 0.01 10*3/uL (ref 0.00–0.07)
Basophils Absolute: 0 10*3/uL (ref 0.0–0.1)
Basophils Relative: 0 %
EOS PCT: 2 %
Eosinophils Absolute: 0.1 10*3/uL (ref 0.0–0.5)
HCT: 48.4 % (ref 39.0–52.0)
HEMOGLOBIN: 15.5 g/dL (ref 13.0–17.0)
Immature Granulocytes: 0 %
LYMPHS PCT: 17 %
Lymphs Abs: 1.3 10*3/uL (ref 0.7–4.0)
MCH: 30.9 pg (ref 26.0–34.0)
MCHC: 32 g/dL (ref 30.0–36.0)
MCV: 96.4 fL (ref 80.0–100.0)
Monocytes Absolute: 1 10*3/uL (ref 0.1–1.0)
Monocytes Relative: 12 %
Neutro Abs: 5.4 10*3/uL (ref 1.7–7.7)
Neutrophils Relative %: 69 %
Platelets: 223 10*3/uL (ref 150–400)
RBC: 5.02 MIL/uL (ref 4.22–5.81)
RDW: 15.8 % — ABNORMAL HIGH (ref 11.5–15.5)
WBC: 7.9 10*3/uL (ref 4.0–10.5)
nRBC: 0 % (ref 0.0–0.2)

## 2018-05-11 LAB — URINALYSIS, ROUTINE W REFLEX MICROSCOPIC
Bilirubin Urine: NEGATIVE
GLUCOSE, UA: NEGATIVE mg/dL
Hgb urine dipstick: NEGATIVE
Ketones, ur: NEGATIVE mg/dL
Leukocytes,Ua: NEGATIVE
Nitrite: NEGATIVE
PROTEIN: 30 mg/dL — AB
Specific Gravity, Urine: 1.015 (ref 1.005–1.030)
pH: 5 (ref 5.0–8.0)

## 2018-05-11 LAB — COMPREHENSIVE METABOLIC PANEL
ALT: 19 U/L (ref 0–44)
AST: 33 U/L (ref 15–41)
Albumin: 3.2 g/dL — ABNORMAL LOW (ref 3.5–5.0)
Alkaline Phosphatase: 94 U/L (ref 38–126)
Anion gap: 13 (ref 5–15)
BUN: 76 mg/dL — ABNORMAL HIGH (ref 8–23)
CO2: 21 mmol/L — ABNORMAL LOW (ref 22–32)
Calcium: 9 mg/dL (ref 8.9–10.3)
Chloride: 97 mmol/L — ABNORMAL LOW (ref 98–111)
Creatinine, Ser: 2.42 mg/dL — ABNORMAL HIGH (ref 0.61–1.24)
GFR calc Af Amer: 26 mL/min — ABNORMAL LOW (ref >60)
GFR calc non Af Amer: 22 mL/min — ABNORMAL LOW (ref >60)
Glucose, Bld: 120 mg/dL — ABNORMAL HIGH (ref 70–99)
Potassium: 4.7 mmol/L (ref 3.5–5.1)
Sodium: 131 mmol/L — ABNORMAL LOW (ref 135–145)
Total Bilirubin: 1.2 mg/dL (ref 0.3–1.2)
Total Protein: 5.9 g/dL — ABNORMAL LOW (ref 6.5–8.1)

## 2018-05-11 LAB — POCT I-STAT EG7
Acid-base deficit: 3 mmol/L — ABNORMAL HIGH (ref 0.0–2.0)
Bicarbonate: 22.7 mmol/L (ref 20.0–28.0)
Calcium, Ion: 1.16 mmol/L (ref 1.15–1.40)
HCT: 45 % (ref 39.0–52.0)
Hemoglobin: 15.3 g/dL (ref 13.0–17.0)
O2 Saturation: 70 %
Patient temperature: 96.5
Potassium: 4.1 mmol/L (ref 3.5–5.1)
Sodium: 130 mmol/L — ABNORMAL LOW (ref 135–145)
TCO2: 24 mmol/L (ref 22–32)
pCO2, Ven: 38.3 mmHg — ABNORMAL LOW (ref 44.0–60.0)
pH, Ven: 7.376 (ref 7.250–7.430)
pO2, Ven: 35 mmHg (ref 32.0–45.0)

## 2018-05-11 LAB — I-STAT TROPONIN, ED: Troponin i, poc: 0.38 ng/mL (ref 0.00–0.08)

## 2018-05-11 LAB — TROPONIN I: TROPONIN I: 0.42 ng/mL — AB (ref <0.03)

## 2018-05-11 LAB — INFLUENZA PANEL BY PCR (TYPE A & B)
Influenza A By PCR: NEGATIVE
Influenza B By PCR: NEGATIVE

## 2018-05-11 LAB — OSMOLALITY: Osmolality: 293 mOsm/kg (ref 275–295)

## 2018-05-11 LAB — TSH: TSH: 3.562 u[IU]/mL (ref 0.350–4.500)

## 2018-05-11 LAB — BRAIN NATRIURETIC PEPTIDE: B Natriuretic Peptide: 574 pg/mL — ABNORMAL HIGH (ref 0.0–100.0)

## 2018-05-11 LAB — LACTIC ACID, PLASMA: Lactic Acid, Venous: 2.3 mmol/L (ref 0.5–1.9)

## 2018-05-11 LAB — PROCALCITONIN: Procalcitonin: <0.1 ng/mL

## 2018-05-11 MED ORDER — ALPRAZOLAM 0.25 MG PO TABS
0.2500 mg | ORAL_TABLET | Freq: Once | ORAL | Status: AC
  Administered 2018-05-11: 0.25 mg via ORAL
  Filled 2018-05-11: qty 1

## 2018-05-11 MED ORDER — SODIUM CHLORIDE 0.9 % IV SOLN
1.0000 g | Freq: Once | INTRAVENOUS | Status: AC
  Administered 2018-05-11: 1 g via INTRAVENOUS
  Filled 2018-05-11: qty 10

## 2018-05-11 MED ORDER — INSULIN ASPART 100 UNIT/ML ~~LOC~~ SOLN: 0.0000 [IU] | SUBCUTANEOUS | Status: DC

## 2018-05-11 MED ORDER — VANCOMYCIN VARIABLE DOSE PER UNSTABLE RENAL FUNCTION (PHARMACIST DOSING): Status: DC

## 2018-05-11 MED ORDER — ASPIRIN 81 MG PO CHEW
324.0000 mg | CHEWABLE_TABLET | Freq: Once | ORAL | Status: AC
  Administered 2018-05-11: 324 mg via ORAL
  Filled 2018-05-11: qty 4

## 2018-05-11 MED ORDER — ACETAMINOPHEN 650 MG RE SUPP: 650.0000 mg | Freq: Four times a day (QID) | RECTAL | Status: DC | PRN

## 2018-05-11 MED ORDER — VANCOMYCIN HCL IN DEXTROSE 1-5 GM/200ML-% IV SOLN: 1000.0000 mg | Freq: Once | INTRAVENOUS | Status: DC

## 2018-05-11 MED ORDER — ACETAMINOPHEN 325 MG PO TABS: 650.0000 mg | ORAL_TABLET | Freq: Four times a day (QID) | ORAL | Status: DC | PRN

## 2018-05-11 MED ORDER — MORPHINE SULFATE (PF) 2 MG/ML IV SOLN
1.0000 mg | INTRAVENOUS | Status: DC | PRN
  Administered 2018-05-12 (×2): 2 mg via INTRAVENOUS
  Filled 2018-05-11 (×2): qty 1

## 2018-05-11 MED ORDER — VANCOMYCIN HCL 10 G IV SOLR
1500.0000 mg | Freq: Once | INTRAVENOUS | Status: AC
  Administered 2018-05-11: 1500 mg via INTRAVENOUS
  Filled 2018-05-11: qty 1500

## 2018-05-11 MED ORDER — SODIUM CHLORIDE 0.9 % IV SOLN
INTRAVENOUS | Status: DC
  Administered 2018-05-12: via INTRAVENOUS

## 2018-05-11 MED ORDER — LORAZEPAM 2 MG/ML IJ SOLN
0.5000 mg | Freq: Four times a day (QID) | INTRAMUSCULAR | Status: DC | PRN
  Administered 2018-05-12: 0.5 mg via INTRAVENOUS
  Filled 2018-05-11: qty 1

## 2018-05-11 MED ORDER — LACTATED RINGERS IV BOLUS
1000.0000 mL | Freq: Once | INTRAVENOUS | Status: AC
  Administered 2018-05-11: 1000 mL via INTRAVENOUS

## 2018-05-11 MED ORDER — METRONIDAZOLE IN NACL 5-0.79 MG/ML-% IV SOLN
500.0000 mg | Freq: Three times a day (TID) | INTRAVENOUS | Status: DC
  Administered 2018-05-12 (×2): 500 mg via INTRAVENOUS
  Filled 2018-05-11 (×2): qty 100

## 2018-05-11 MED ORDER — SODIUM CHLORIDE 0.9 % IV SOLN
1.0000 g | INTRAVENOUS | Status: DC
  Administered 2018-05-12: 1 g via INTRAVENOUS
  Filled 2018-05-11: qty 1

## 2018-05-11 MED ORDER — ONDANSETRON HCL 4 MG/2ML IJ SOLN: 4.0000 mg | Freq: Four times a day (QID) | INTRAMUSCULAR | Status: DC | PRN

## 2018-05-11 MED ORDER — ALBUTEROL SULFATE (2.5 MG/3ML) 0.083% IN NEBU: 2.5000 mg | INHALATION_SOLUTION | RESPIRATORY_TRACT | Status: DC | PRN

## 2018-05-11 MED ORDER — HYDROCODONE-ACETAMINOPHEN 5-325 MG PO TABS: 1.0000 | ORAL_TABLET | ORAL | Status: DC | PRN

## 2018-05-11 MED ORDER — ONDANSETRON HCL 4 MG PO TABS: 4.0000 mg | ORAL_TABLET | Freq: Four times a day (QID) | ORAL | Status: DC | PRN

## 2018-05-11 MED ORDER — LACTATED RINGERS IV BOLUS: 1000.0000 mL | Freq: Once | INTRAVENOUS | Status: DC

## 2018-05-11 NOTE — ED Notes (Signed)
Dr Reather Converse informed of EG7 results

## 2018-05-11 NOTE — Progress Notes (Signed)
Pharmacy Antibiotic Note  Jon [name] is a 85 y.o. male admitted on 05/30/2018 with sepsis.  Pharmacy has been consulted for cefepime dosing.  Already started on vanc.  Plan: Cefepime 1g IV Q24H.  Temp (24hrs), Avg:96.5 F (35.8 C), Min:96.5 F (35.8 C), Max:96.5 F (35.8 C)  Recent Labs  Lab 05/25/2018 1809  WBC 7.9  CREATININE 2.42*  LATICACIDVEN 2.3*    Estimated Creatinine Clearance: 18.1 mL/min (A) (by C-G formula based on SCr of 2.42 mg/dL (H)).    Allergies  Allergen Reactions  . Quinolones Other (See Comments)    Risk of rupture of AAA  . Caduet [Amlodipine-Atorvastatin] Other (See Comments)    Weakness   . Crestor [Rosuvastatin] Other (See Comments)    Muscle pain/weakness  . Lipitor [Atorvastatin Calcium] Other (See Comments)    Muscle pain/weakness  . Simvastatin Other (See Comments)    Muscle pain/weakness  . Norvasc [Amlodipine] Other (See Comments)    "Allergic," per MAR  . Pollen Extract Other (See Comments)    And other "air pollutants," per [facility] "Allergic"  . Lisinopril Other (See Comments)    "Allergic," per [facility]     Thank you for allowing pharmacy to be a part of this patient's care.  Wynona Neat, PharmD, BCPS  06/04/2018 11:49 PM

## 2018-05-11 NOTE — ED Triage Notes (Signed)
Per [facility], Pt from from [facility] on [address]. Pt's daughter at bedside reports pt seen 2 weeks ago with nausea and discharged. Pt has since had poor appetite, increasing weakness over the last 2 weeks. 2 weeks ago was able to walk, now unable to bear weight. Pt denies pain or nausea. Daughter reports he had a fall a few nights ago. She reports blood work drawn today showed increased BUN and Crt. She reports pt's O2 has been fluctuating and he has been on O2 [place].
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
## 2018-05-11 NOTE — ED Notes (Signed)
Nurse collecting blood cultures.

## 2018-05-11 NOTE — Progress Notes (Signed)
Pharmacy Antibiotic Note  Jon [name] is a 85 y.o. male admitted on 05/25/2018 with sepsis. Pharmacy has been consulted for vancomycin dosing. Pt is afebrile and WBC is WNL. SCr is elevated at 2.42 which is above patients baseline. Lactic acid is also elevated.   Plan: Vancomycin 1500mg  IV x 1 then trend Scr for further doses F/u renal fxn, C&S, clinical status and peak/trough at SS     Temp (24hrs), Avg:96.5 F (35.8 C), Min:96.5 F (35.8 C), Max:96.5 F (35.8 C)  Recent Labs  Lab 05/16/2018 1809  WBC 7.9  CREATININE 2.42*  LATICACIDVEN 2.3*    Estimated Creatinine Clearance: 18.1 mL/min (A) (by C-G formula based on SCr of 2.42 mg/dL (H)).    Allergies  Allergen Reactions  . Quinolones Other (See Comments)    Risk of rupture of AAA  . Caduet [Amlodipine-Atorvastatin] Other (See Comments)    Weakness   . Crestor [Rosuvastatin] Other (See Comments)    Muscle pain/weakness  . Lipitor [Atorvastatin Calcium] Other (See Comments)    Muscle pain/weakness  . Simvastatin Other (See Comments)    Muscle pain/weakness  . Norvasc [Amlodipine] Other (See Comments)    "Allergic," per MAR  . Pollen Extract Other (See Comments)    And other "air pollutants," per [facility] "Allergic"  . Lisinopril Other (See Comments)    "Allergic," per MAR    Antimicrobials this admission: Vanc 3/6>> CTX x 1 3/6  Dose adjustments this admission: [name]  Microbiology results: Pending  Thank you for allowing pharmacy to be a part of this patient's care.  Maize Brittingham, Rande Lawman 05/10/2018 9:41 PM

## 2018-05-11 NOTE — H&P (Signed)
Jon [name] DJM:[id number] DOB: 10/28/1921 DOA: 06/03/2018     PCP: Mast, Man X, NP   Outpatient Specialists:   CARDS: Dr. Debara Pickett     Patient arrived to ER on 05/31/2018 at 1711  Patient coming from:  From facility [facility]   Chief Complaint:  Chief Complaint  Patient presents with  . Weakness  . Abnormal Lab    HPI: Jon [name] is a 85 y.o. male with medical history significant of aspiration pneumonia paroxysmal atrial fibrillation, Systolic CHF, CKD< CAD, sp VA, DM 2, HTN , HLD        Presented with 2 wks hx of decreased PO intake his Lasix was stopped He was seen 2 weeks ago at that time had Nausea and was able  to be discharged.  He had a fall few nights ago  [facility] blood work showed increase Cr and BUN He has been more short of breath and have been using Oxygen at the facility   He is on Eliquis for anticoagulation for hx of A. Fib On losartan and Lopressor for hx of HTN     While in ER: While in ER was Hypotensive Found to Have AKI Was tachypnea low BP meeting sepsis criteria Now Temp down to 96.5 hypotensive 75/51 now BP 85/60 Trop 0.38 lactic acid 2.3   The following Work up has been ordered so far:  Orders Placed This Encounter  Procedures  . CT Head Wo Contrast  . DG Chest Portable 1 View  . Urinalysis, Routine w reflex microscopic  . Comprehensive metabolic panel  . CBC with Differential  . TSH  . Lactic acid, plasma  . Brain natriuretic peptide  . Influenza panel by PCR (type A & B)  . Check Rectal Temperature  . Consult to hospitalist  . I-Stat Troponin, ED (not at [facility])  . EKG 12-Lead    Following Medications were ordered in ER: Medications  cefTRIAXone (ROCEPHIN) 1 g in sodium chloride 0.9 % 100 mL IVPB (has no administration in time range)  lactated ringers bolus 1,000 mL (1,000 mLs Intravenous New Bag/Given 05/30/2018 1811)  aspirin chewable tablet 324 mg (324 mg Oral Given 05/10/2018 1854)  ALPRAZolam (XANAX)  tablet 0.25 mg (0.25 mg Oral Given 06/05/2018 1854)    Significant initial  Findings: Abnormal Labs Reviewed  URINALYSIS, ROUTINE W REFLEX MICROSCOPIC - Abnormal; Notable for the following components:      Result Value   Protein, ur 30 (*)    Bacteria, UA RARE (*)    All other components within normal limits  COMPREHENSIVE METABOLIC PANEL - Abnormal; Notable for the following components:   Sodium 131 (*)    Chloride 97 (*)    CO2 21 (*)    Glucose, Bld 120 (*)    BUN 76 (*)    Creatinine, Ser 2.42 (*)    Total Protein 5.9 (*)    Albumin 3.2 (*)    GFR calc non Af Amer 22 (*)    GFR calc Af Amer 26 (*)    All other components within normal limits  CBC WITH DIFFERENTIAL/PLATELET - Abnormal; Notable for the following components:   RDW 15.8 (*)    All other components within normal limits  LACTIC ACID, PLASMA - Abnormal; Notable for the following components:   Lactic Acid, Venous 2.3 (*)    All other components within normal limits  BRAIN NATRIURETIC PEPTIDE - Abnormal; Notable for the following components:   B Natriuretic Peptide  574.0 (*)    All other components within normal limits  I-STAT TROPONIN, ED - Abnormal; Notable for the following components:   Troponin i, poc 0.38 (*)    All other components within normal limits   Lactic Acid, Venous    Component Value Date/Time   LATICACIDVEN 2.3 (HH) 05/12/2018 1809    Na 131 K 4.7  Cr    Up from baseline see below Lab Results  Component Value Date   CREATININE 2.42 (H) 05/07/2018   CREATININE 1.26 (H) 04/21/2018   CREATININE 1.1 04/17/2018      WBC  7.9  HG/HCT  Stable,     Component Value Date/Time   HGB 15.5 05/18/2018 1809   HCT 48.4 05/07/2018 1809     Troponin (Point of Care Test) Recent Labs    05/28/2018 1827  TROPIPOC 0.38*     BNP (last 3 results) Recent Labs    04/11/18 04/21/18 1619 05/24/2018 1809  BNP 203 228.3* 574.0*    ProBNP (last 3 results) No results for input(s): PROBNP in the last  8760 hours.  Influenza neg    UA no evidence of UTI     CT HEAD  NON acute  CXR - NON acute   ECG:  Personally reviewed by me showing: HR :  64 Rhythm:   A.fib.   no evidence of ischemic changes QTC 445        ED Triage Vitals  Enc Vitals Group     BP 05/07/2018 1722 90/63     Pulse Rate 05/28/2018 1722 66     Resp 05/22/2018 1730 16     Temp 05/14/2018 2117 (!) 96.5 F (35.8 C)     Temp Source 06/03/2018 2117 Rectal     SpO2 05/26/2018 1722 94 %     Weight --      Height --      Head Circumference --      Peak Flow --      Pain Score 05/25/2018 1722 0     Pain Loc --      Pain Edu? --      Excl. in [place]? --   TMAX(24)@       Latest  Blood pressure (!) 75/51, pulse (!) 59, temperature (!) 96.5 F (35.8 C), temperature source Rectal, resp. rate 19, SpO2 91 %.     Hospitalist was called for admission for SIRS    Review of Systems:    Pertinent positives include: fatigue, weight loss, falls, decreased PO intake   Constitutional:  No weight loss, night sweats, Fevers, chills,  HEENT:  No headaches, Difficulty swallowing,Tooth/dental problems,Sore throat,  No sneezing, itching, ear ache, nasal congestion, post nasal drip,  Cardio-vascular:  No chest pain, Orthopnea, PND, anasarca, dizziness, palpitations.no Bilateral lower extremity swelling  GI:  No heartburn, indigestion, abdominal pain, nausea, vomiting, diarrhea, change in bowel habits, loss of appetite, melena, blood in stool, hematemesis Resp:  no shortness of breath at rest. No dyspnea on exertion, No excess mucus, no productive cough, No non-productive cough, No coughing up of blood.No change in color of mucus.No wheezing. Skin:  no rash or lesions. No jaundice GU:  no dysuria, change in color of urine, no urgency or frequency. No straining to urinate.  No flank pain.  Musculoskeletal:  No joint pain or no joint swelling. No decreased range of motion. No back pain.  Psych:  No change in mood or affect. No  depression or anxiety. No memory loss.  Neuro:  no localizing neurological complaints, no tingling, no weakness, no double vision, no gait abnormality, no slurred speech, no confusion  All systems reviewed and apart from [place] all are negative  Past Medical History:   Past Medical History:  Diagnosis Date  . Abnormal PFTs 10/30/2012   Followed in Pulmonary clinic/ [facility]/ [place]  - PFT's 10/22/12 VC  55% no obst, DLCO 50%  - PFT's 12/20/2012 VC 83% and no obst, dLCO 55%  -11/08/2012  Walked RA x 3 laps @ 185 ft each stopped due to  End of study, not desat -11/08/12 esr 10    . Abnormality of gait 04/18/2013  . Acute on chronic systolic congestive heart failure, NYHA class 2 ([place]) 03/16/2006   BNP 376.9 05/28/13   . Adjustment disorder with mixed anxiety and depressed mood 04/18/2013   Post CVA depression and anxiety   . Allergy   . Anxiety   . Aortic aneurysm of unspecified site without mention of rupture 10/27/2011  . Arthritis   . Atherosclerosis of renal artery ([place]) 10/18/2006  . Atrial fibrillation ([place]) 03/04/2011    He was on coumadin until his stroke last December and was switched to apixaban 2.'5mg'$  daily which he has been taking faithfully without reported side effects.    . Balance disorder 07/31/2014  . Basal cell carcinoma of skin of other and unspecified parts of face 12/24/2009  . Blood transfusion without reported diagnosis   . Cardiac pacemaker in situ- MDT 10/11 03/18/2010   Medtronic revo implant in October 2011. Severe sinus bradycardia in the 30s, but not truly pacemaker dependent   . Cataract   . CKD stage 2 due to type 2 diabetes mellitus ([place]) 07/03/2014  . Coronary artery disease   . CVA - Rt brain stroke 12/14 02/18/2013   02/19/13 angiography CT head:  Diffuse atherosclerotic irregularity and plaque formation of the distal right common carotid artery and proximal internal carotid artery without significant stenosis. Plaque ulceration is present and is a source of  emboli. A small right thalamic CVA    . DM (diabetes mellitus), type 2 with renal complications ([place]) 0/03/7492  . Dyslipidemia 11/03/2004  . Fall 03/04/2011  . Fatigue 04/18/2013  . Gout, unspecified 11/03/2004  . Heart murmur   . History of abdominal aortic aneurysm   . History of Doppler ultrasound 05/22/2012   LEAs; R anterior tibial artery appeared occluded; L posterior tibial shows short segment of occlusive ds  . History of Doppler ultrasound 05/22/2012   Abdominal Aortic Doppler; slight increase in fusiform aneurysm   . History of echocardiogram 12/2008   EF >55%; mild concentric LVH; mild MR; mild-mod TR; mild AV regurg;   . History of nuclear stress test 12/2010   lexiscan; low risk; compared to prior study, perfusion improved  . HTN (hypertension) 03/04/2011  . Hyperlipidemia   . Hypertension   . Hypertrophy of prostate with urinary obstruction and other lower urinary tract symptoms (LUTS) 04/28/2004  . Hyponatremia 03/04/2011  . Hypothyroidism 03/04/2011  . Impotence of organic origin 10/03/2000  . Internal hemorrhoids without mention of complication 49/67/5916  . Long term (current) use of anticoagulants 03/17/2011  . Major depressive disorder, single episode, unspecified 03/05/2009  . Memory change 01/24/2013  . Muscle weakness (generalized) 03/10/2011  . Nocturia 10/27/2011  . Obstructive sleep apnea (adult) (pediatric) 07/23/2008  . Open wound of knee, leg (except thigh), and ankle, complicated 3/84/6659  . Osteoarthritis of both knees 07/31/2014  . Osteoarthrosis, unspecified whether generalized or localized,  unspecified site 02/05/2003  . PAF (paroxysmal atrial fibrillation) (HCC)    coumadin  . Pain in joint, lower leg 08/04/2011  . Pruritic condition 01/24/2013  . Quadriceps weakness 07/31/2014  . Right bundle branch block 04/19/2006  . 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 Spinal stenosis in cervical region 12/15/2004    . SSS (sick sinus syndrome) ([place]) 04/08/2014  . Stroke ([place])   . Thyroid disease   . TIA (transient ischemic attack) 04/30/2013  . Type II or unspecified type diabetes mellitus without mention of complication, not stated as uncontrolled 11/24/2004  . Unspecified vitamin D deficiency 10/18/2006  . 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 Increased exposure of the right cornea.       Past Surgical History:  Procedure Laterality Date  . CORONARY ANGIOPLASTY WITH STENT PLACEMENT  2008   stent to SVG to OM  . CORONARY ARTERY BYPASS GRAFT  1989  . EYE SURGERY    . HERNIA REPAIR    . INSERT / REPLACE / REMOVE PACEMAKER    . JOINT REPLACEMENT    . PACEMAKER INSERTION  2011    Social History:       reports that he quit smoking about 51 years ago. His smoking use included cigarettes. He has a 11.25 pack-year smoking history. He has never used smokeless tobacco. He reports that he does not drink alcohol or use drugs.     Family History:   Family History  Problem Relation Age of Onset  . Diabetes Father   . Other Mother        PAD with amputations    Allergies: Allergies  Allergen Reactions  . Quinolones Other (See Comments)    Risk of rupture of AAA  . Caduet [Amlodipine-Atorvastatin] Other (See Comments)    Weakness   . Crestor [Rosuvastatin] Other (See Comments)    Muscle pain/weakness  . Lipitor [Atorvastatin Calcium] Other (See Comments)    Muscle pain/weakness  . Simvastatin Other (See Comments)    Muscle pain/weakness  . Norvasc [Amlodipine] Other (See Comments)    "Allergic," per MAR  . Pollen Extract Other (See Comments)    And other "air pollutants," per [facility] "Allergic"  . Lisinopril Other (See Comments)    "Allergic," per [facility]     Prior to Admission medications   Medication Sig Start Date End Date Taking? Authorizing Provider  acetaminophen (TYLENOL) 325 MG tablet Take 650 mg by mouth every 4 (four) hours as needed for mild pain or  headache (NOT TO EXCEED 3,000 mg/24 hours).   Yes [provider]  allopurinol (ZYLOPRIM) 100 MG tablet Take 50 mg by mouth daily.    Yes [provider]  ALPRAZolam (XANAX) 0.25 MG tablet Take 1 tablet (0.25 mg total) by mouth 2 (two) times daily as needed for anxiety. 04/20/18  Yes Mast, Man X, NP  amoxicillin (AMOXIL) 500 MG tablet Take 2,000 mg by mouth See admin instructions. Take 2,000 mg by mouth 1 hour prior to dental appointments   Yes [provider]  apixaban (ELIQUIS) 2.5 MG TABS tablet Take 1 tablet (2.5 mg total) by mouth 2 (two) times daily. 10/22/13  Yes Hilty, Nadean Corwin, MD  calcium carbonate (TUMS - DOSED IN MG ELEMENTAL CALCIUM) 500 MG chewable tablet Chew 1 tablet by mouth 3 (three) times daily as needed for indigestion or heartburn.    Yes [provider]  Carboxymethylcellulose Sodium [facility]  OP) Place 1 drop into both eyes daily as needed (DRY EYES).    Yes [provider]  Cholecalciferol (VITAMIN D3) 50 MCG (2000 UT) TABS Take 2,000 Units by mouth daily.   Yes [provider]  furosemide (LASIX) 20 MG tablet Take 20 mg by mouth daily as needed (for a weight gain >3 pounds/24 hours or 5 pounds/week).    Yes [provider]  ipratropium (ATROVENT) 0.06 % nasal spray Place 1 spray into both nostrils 3 (three) times daily as needed for rhinitis.   Yes [provider]  ipratropium-albuterol (DUONEB) 0.5-2.5 (3) MG/3ML SOLN Take 3 mLs by nebulization 4 (four) times daily as needed (SHORTNESS OF BREATH).    Yes [provider]  levothyroxine (SYNTHROID, LEVOTHROID) 125 MCG tablet Take 125 mcg by mouth daily before breakfast.   Yes [provider]  losartan (COZAAR) 25 MG tablet Take 1 tablet (25 mg total) by mouth daily. 11/03/15  Yes Hilty, Nadean Corwin, MD  meclizine (ANTIVERT) 25 MG tablet Take 25 mg by mouth every 6 (six) hours as needed for dizziness.    Yes [provider]  Menthol,  Topical Analgesic, (BIOFREEZE) 4 % GEL Apply 1 application topically 4 (four) times daily as needed (left knee pain).    Yes [provider]  metoprolol tartrate (LOPRESSOR) 25 MG tablet Take 1 tablet (25 mg total) by mouth 2 (two) times daily. 08/10/16  Yes Croitoru, Mihai, MD  neomycin-bacitracin-polymyxin (NEOSPORIN) [phone] ointment Apply 1 application topically daily as needed (both nares for dryness).    Yes [provider]  nitroGLYCERIN (NITROSTAT) 0.4 MG SL tablet Place 0.4 mg under the tongue every 5 (five) minutes x 3 doses as needed for chest pain.    Yes [provider]  polyethylene glycol (MIRALAX / GLYCOLAX) packet Take 17 g by mouth daily.   Yes [provider]  tobramycin-dexamethasone (TOBRADEX) ophthalmic ointment Place 1 application into both eyes 2 (two) times daily.   Yes [provider]  triamcinolone cream (KENALOG) 0.1 % Apply 1 application topically daily. GROIN AREA   Yes [provider]  White Petrolatum-Mineral Oil (RETAINE PM) OINT Place 1 application into both eyes as directed.   Yes [provider]  Dextromethorphan-guaiFENesin (ROBAFEN DM) 10-100 MG/5ML liquid Give Robafen DM syrup 10 mL by mouth every 6 hours PRN for cough X 48 hours (Use sugar-free for diabetic patients.) Notify MD of continued cough.    [provider]  Humidifiers ([employer] COOL MIST HUMIDIFER) MISC by Does not apply route. RES. STATES DOES NOT USE HUMIDIFIER IN THE SUMMER MONTHS/REQUEST THIS ORDER CHANGED TO PRN: ONCE HUMIDIFIER IS BACK IN USE, STAFF TO CHECK EVERY SHIFT/FILLED WITH WATER AS NEEDED    [provider]   Physical Exam: Blood pressure (!) 75/51, pulse (!) 59, temperature (!) 96.5 F (35.8 C), temperature source Rectal, resp. rate 19, SpO2 91 %. 1. General:  in   Acute distress increased work of breathing     Chronically ill  acutely ill -appearing 2. Psychological: Alert and     Oriented 3. Head/ENT:     Dry Mucous Membranes                          Head Non traumatic, neck supple                           Poor Dentition 4. SKIN: normal  Skin turgor,  Skin clean Dry and intact no rash 5. Heart: Regular rate and rhythm systolic  Murmur, no Rub or gallop 6. Lungs:   no wheezes some  crackles   7. Abdomen: Soft, non-tender, Non distended  bowel sounds present 8. Lower extremities: no clubbing, cyanosis, no edema 9. Neurologically Grossly intact, moving all 4 extremities equally 10. MSK: Normal range of motion   LABS:     Recent Labs  Lab 05/26/2018 1809  WBC 7.9  NEUTROABS 5.4  HGB 15.5  HCT 48.4  MCV 96.4  PLT 308   Basic Metabolic Panel: Recent Labs  Lab 06/05/2018 1809  NA 131*  K 4.7  CL 97*  CO2 21*  GLUCOSE 120*  BUN 76*  CREATININE 2.42*  CALCIUM 9.0      Recent Labs  Lab 05/09/2018 1809  AST 33  ALT 19  ALKPHOS 94  BILITOT 1.2  PROT 5.9*  ALBUMIN 3.2*   No results for input(s): LIPASE, AMYLASE in the last 168 hours. No results for input(s): AMMONIA in the last 168 hours.    HbA1C: No results for input(s): HGBA1C in the last 72 hours. CBG: No results for input(s): GLUCAP in the last 168 hours.    Urine analysis:    Component Value Date/Time   COLORURINE YELLOW 05/24/2018 1809   APPEARANCEUR CLEAR 05/09/2018 1809   LABSPEC 1.015 05/14/2018 1809   PHURINE 5.0 05/18/2018 1809   GLUCOSEU NEGATIVE 05/07/2018 1809   HGBUR NEGATIVE 05/17/2018 1809   BILIRUBINUR NEGATIVE 05/29/2018 1809   KETONESUR NEGATIVE 05/20/2018 1809   PROTEINUR 30 (A) 05/14/2018 1809   UROBILINOGEN 0.2 04/30/2013 1507   NITRITE NEGATIVE 05/17/2018 1809   LEUKOCYTESUR NEGATIVE 05/14/2018 1809      Cultures:    Component Value Date/Time   SDES SPUTUM 03/04/2011 1844   SDES SPUTUM 03/04/2011 1844   SPECREQUEST NONE 03/04/2011 1844   SPECREQUEST NONE 03/04/2011 1844   CULT NORMAL OROPHARYNGEAL FLORA 03/04/2011 1844   REPTSTATUS 03/04/2011 FINAL 03/04/2011 1844   REPTSTATUS  03/07/2011 FINAL 03/04/2011 1844     Radiological Exams on Admission: Ct Head Wo Contrast  Result Date: 05/27/2018 CLINICAL DATA:  Increasing weakness, poor appetite EXAM: CT HEAD WITHOUT CONTRAST TECHNIQUE: Contiguous axial images were obtained from the base of the skull through the vertex without intravenous contrast. COMPARISON:  CT brain 02/07/2015, MRI 05/01/2013 FINDINGS: Brain: No acute territorial infarction, hemorrhage, or intracranial mass. Chronic lacunar infarct in the right thalamus. Chronic infarct in the left cerebellum. Moderate small vessel ischemic changes of the white matter. Moderate atrophy. Stable ventricle size. Vascular: No hyperdense vessels. Vertebral and carotid vascular calcification Skull: No fracture Sinuses/Orbits: Mucosal thickening in the ethmoid sinuses Other: None IMPRESSION: 1. No CT evidence for acute intracranial abnormality. 2. Atrophy and small vessel ischemic changes of the white matter. Chronic infarcts in the right thalamus and left cerebellum. Electronically Signed   By: Donavan Foil M.D.   On: 05/06/2018 19:57   Dg Chest Portable 1 View  Result Date: 05/21/2018 CLINICAL DATA:  Cough and fall EXAM: PORTABLE CHEST 1 VIEW COMPARISON:  04/21/2018 FINDINGS: The heart size and mediastinal contours are within normal limits. There is bibasilar atelectasis without consolidation. The visualized skeletal structures are unremarkable. Unchanged position of left chest wall pacemaker leads. Remote median sternotomy and CABG. IMPRESSION: No active disease. Electronically Signed   By: Ulyses Jarred M.D.   On: 05/27/2018 18:43    Chart has been reviewed    Assessment/Plan  85 y.o.  male with medical history significant of aspiration pneumonia paroxysmal atrial fibrillation, Systolic CHF, CKD< CAD, sp VA, DM 2, HTN , HLD  Admitted for SIRS   Present on Admission: . SIRS (systemic inflammatory response syndrome) (HCC) cont gentle IVF to avoid fluid overload, cont IV  antibiotics with hypotension with failure to correct with IV fluids pt after discussion with family chose to concentrate on comfort   . Atrial fibrillation ([place])  Chronic hold off home meds given hypotension, will need to discuss with family and pt benefits vs risk of continuation of anticoagulation, hold of Eliquis for tonight given AKI     . HTN (hypertension) - hypotensive will hold home medicaitons . Hypothyroidism - -  continue home medications at current dose    . Diabetes mellitus, type 2, with circulatory disorder ([place]) -  - Order Sensitive  SSI     - Hold by mouth medications    . Dyslipidemia - stable will need to discuss with family benefits vs risk of continuation statins  . Chronic systolic congestive heart failure (HCC) - avoid overhydration to help with comfort pt has very little cardiopulmonary reseserve. Spoke about minimizing pulmonary edema for comfort    . CKD (chronic kidney disease) stage 2, GFR 60-89 ml/min - avoid nephrotoxic medications . GERD (gastroesophageal reflux disease) - chronic stable  . Hyponatremia - mild pt would like to avoid repeat labs  . Acute kidney injury superimposed on chronic kidney disease ([place]) - rehydrate gently pt would like  to avoid repeat labs   Other plan as per orders.  DVT prophylaxis:  SCD   Code Status:   DNR/DNI  With  comfort care as per patient  Avoid aggressive interventions ok to use IVF and Antibiotics for now Pt would like to hold off on additional testing And instead concentrate on comfort  I had personally discussed CODE STATUS with patient and family  I had spent 35 min discussing goals of care and CODE STATUS  Family Communication:   Family at  Bedside  plan of care was discussed with  Daughter  Disposition Plan:     likely will need placement for rehabilitation                                                Palliative care    consulted                                     Consults called: none    Admission status:   Obs    Level of care  medical floor           Toy Baker 05/12/2018, 2:43 AM    [facility]     after 2 AM please page floor coverage PA If 7AM-7PM, please contact the day team taking care of the patient using [email or web address]

## 2018-05-11 NOTE — ED Notes (Signed)
ED TO INPATIENT HANDOFF REPORT  ED Nurse Name and Phone #: Caprice Kluver [phone]  S Name/Age/Gender Jon [name] 85 y.o. male Room/Bed: 023C/023C  Code Status   Code Status: Prior  Home/SNF/Other Home Patient oriented to: self, place, time and situation Is this baseline? Yes   Triage Complete: Triage complete  Chief Complaint weakness/sob  Triage Note Per [facility], Pt from from [facility] on [address] [place]. Pt's daughter at bedside reports pt seen 2 weeks ago with nausea and discharged. Pt has since had poor appetite, increasing weakness over the last 2 weeks. 2 weeks ago was able to walk, now unable to bear weight. Pt denies pain or nausea. Daughter reports he had a fall a few nights ago. She reports blood work drawn today showed increased BUN and Crt. She reports pt's O2 has been fluctuating and he has been on O2 [place].   Allergies Allergies  Allergen Reactions  . Quinolones Other (See Comments)    Risk of rupture of AAA  . Caduet [Amlodipine-Atorvastatin] Other (See Comments)    Weakness   . Crestor [Rosuvastatin] Other (See Comments)    Muscle pain/weakness  . Lipitor [Atorvastatin Calcium] Other (See Comments)    Muscle pain/weakness  . Simvastatin Other (See Comments)    Muscle pain/weakness  . Norvasc [Amlodipine] Other (See Comments)    "Allergic," per MAR  . Pollen Extract Other (See Comments)    And other "air pollutants," per [facility] "Allergic"  . Lisinopril Other (See Comments)    "Allergic," per MAR    Level of Care/Admitting Diagnosis ED Disposition    ED Disposition Condition [place]: [place] [100100]  Level of Care: Med-Surg [16]  I expect the patient will be discharged within 24 hours: No (not a candidate for 5C-Observation unit)  Diagnosis: SIRS (systemic inflammatory response syndrome) ([place]) [147829]  Admitting Physician: Toy Baker [[id number]]  Attending Physician: Toy Baker [[id number]]  PT Class  (Do Not Modify): Observation [104]  PT Acc Code (Do Not Modify): Observation [10022]       B Medical/Surgery History Past Medical History:  Diagnosis Date  . Abnormal PFTs 10/30/2012   Followed in Pulmonary clinic/ [facility]/ [place]  - PFT's 10/22/12 VC  55% no obst, DLCO 50%  - PFT's 12/20/2012 VC 83% and no obst, dLCO 55%  -11/08/2012  Walked RA x 3 laps @ 185 ft each stopped due to  End of study, not desat -11/08/12 esr 10    . Abnormality of gait 04/18/2013  . Acute on chronic systolic congestive heart failure, NYHA class 2 ([place]) 03/16/2006   BNP 376.9 05/28/13   . Adjustment disorder with mixed anxiety and depressed mood 04/18/2013   Post CVA depression and anxiety   . Allergy   . Anxiety   . Aortic aneurysm of unspecified site without mention of rupture 10/27/2011  . Arthritis   . Atherosclerosis of renal artery ([place]) 10/18/2006  . Atrial fibrillation ([place]) 03/04/2011    He was on coumadin until his stroke last December and was switched to apixaban 2.'5mg'$  daily which he has been taking faithfully without reported side effects.    . Balance disorder 07/31/2014  . Basal cell carcinoma of skin of other and unspecified parts of face 12/24/2009  . Blood transfusion without reported diagnosis   . Cardiac pacemaker in situ- MDT 10/11 03/18/2010   Medtronic revo implant in October 2011. Severe sinus bradycardia in the 30s, but not truly pacemaker dependent   .  Cataract   . CKD stage 2 due to type 2 diabetes mellitus ([place]) 07/03/2014  . Coronary artery disease   . CVA - Rt brain stroke 12/14 02/18/2013   02/19/13 angiography CT head:  Diffuse atherosclerotic irregularity and plaque formation of the distal right common carotid artery and proximal internal carotid artery without significant stenosis. Plaque ulceration is present and is a source of emboli. A small right thalamic CVA    . DM (diabetes mellitus), type 2 with renal complications ([place]) 6/96/2952  . Dyslipidemia 11/03/2004  . Fall  03/04/2011  . Fatigue 04/18/2013  . Gout, unspecified 11/03/2004  . Heart murmur   . History of abdominal aortic aneurysm   . History of Doppler ultrasound 05/22/2012   LEAs; R anterior tibial artery appeared occluded; L posterior tibial shows short segment of occlusive ds  . History of Doppler ultrasound 05/22/2012   Abdominal Aortic Doppler; slight increase in fusiform aneurysm   . History of echocardiogram 12/2008   EF >55%; mild concentric LVH; mild MR; mild-mod TR; mild AV regurg;   . History of nuclear stress test 12/2010   lexiscan; low risk; compared to prior study, perfusion improved  . HTN (hypertension) 03/04/2011  . Hyperlipidemia   . Hypertension   . Hypertrophy of prostate with urinary obstruction and other lower urinary tract symptoms (LUTS) 04/28/2004  . Hyponatremia 03/04/2011  . Hypothyroidism 03/04/2011  . Impotence of organic origin 10/03/2000  . Internal hemorrhoids without mention of complication 84/13/2440  . Long term (current) use of anticoagulants 03/17/2011  . Major depressive disorder, single episode, unspecified 03/05/2009  . Memory change 01/24/2013  . Muscle weakness (generalized) 03/10/2011  . Nocturia 10/27/2011  . Obstructive sleep apnea (adult) (pediatric) 07/23/2008  . Open wound of knee, leg (except thigh), and ankle, complicated 03/09/7251  . Osteoarthritis of both knees 07/31/2014  . Osteoarthrosis, unspecified whether generalized or localized, unspecified site 02/05/2003  . PAF (paroxysmal atrial fibrillation) (HCC)    coumadin  . Pain in joint, lower leg 08/04/2011  . Pruritic condition 01/24/2013  . Quadriceps weakness 07/31/2014  . Right bundle branch block 04/19/2006  . 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 Spinal stenosis in cervical region 12/15/2004  . SSS (sick sinus syndrome) ([place]) 04/08/2014  . Stroke ([place])   . Thyroid disease   . TIA (transient ischemic attack) 04/30/2013  . Type II or  unspecified type diabetes mellitus without mention of complication, not stated as uncontrolled 11/24/2004  . Unspecified vitamin D deficiency 10/18/2006  . 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 Increased exposure of the right cornea.    Past Surgical History:  Procedure Laterality Date  . CORONARY ANGIOPLASTY WITH STENT PLACEMENT  2008   stent to SVG to OM  . CORONARY ARTERY BYPASS GRAFT  1989  . EYE SURGERY    . HERNIA REPAIR    . INSERT / REPLACE / REMOVE PACEMAKER    . JOINT REPLACEMENT    . PACEMAKER INSERTION  2011     A IV Location/Drains/Wounds Patient Lines/Drains/Airways Status   Active Line/Drains/Airways    Name:   Placement date:   Placement time:   Site:   Days:   Peripheral IV 05/28/2018 Left Forearm   05/15/2018    1725    Forearm   less than 1   Peripheral IV 05/17/2018 Right Forearm   05/22/2018    2138    Forearm   less  than 1          Intake/Output Last 24 hours  Intake/Output Summary (Last 24 hours) at 05/12/2018 2311 Last data filed at 05/23/2018 2310 Gross per 24 hour  Intake 1100 ml  Output -  Net 1100 ml    Labs/Imaging Results for orders placed or performed during the hospital encounter of 05/29/2018 (from the past 48 hour(s))  Urinalysis, Routine w reflex microscopic     Status: Abnormal   Collection Time: 06/02/2018  6:09 PM  Result Value Ref Range   Color, Urine YELLOW YELLOW   APPearance CLEAR CLEAR   Specific Gravity, Urine 1.015 1.005 - 1.030   pH 5.0 5.0 - 8.0   Glucose, UA NEGATIVE NEGATIVE mg/dL   Hgb urine dipstick NEGATIVE NEGATIVE   Bilirubin Urine NEGATIVE NEGATIVE   Ketones, ur NEGATIVE NEGATIVE mg/dL   Protein, ur 30 (A) NEGATIVE mg/dL   Nitrite NEGATIVE NEGATIVE   Leukocytes,Ua NEGATIVE NEGATIVE   WBC, UA 0-5 0 - 5 WBC/hpf   Bacteria, UA RARE (A) NONE SEEN   Squamous Epithelial / LPF 0-5 0 - 5   Hyaline Casts, UA PRESENT     Comment: Performed at [facility], [address] [place], [place] [address]   Comprehensive metabolic panel     Status: Abnormal   Collection Time: 06/02/2018  6:09 PM  Result Value Ref Range   Sodium 131 (L) 135 - 145 mmol/L   Potassium 4.7 3.5 - 5.1 mmol/L   Chloride 97 (L) 98 - 111 mmol/L   CO2 21 (L) 22 - 32 mmol/L   Glucose, Bld 120 (H) 70 - 99 mg/dL   BUN 76 (H) 8 - 23 mg/dL   Creatinine, Ser 2.42 (H) 0.61 - 1.24 mg/dL   Calcium 9.0 8.9 - 10.3 mg/dL   Total Protein 5.9 (L) 6.5 - 8.1 g/dL   Albumin 3.2 (L) 3.5 - 5.0 g/dL   AST 33 15 - 41 U/L   ALT 19 0 - 44 U/L   Alkaline Phosphatase 94 38 - 126 U/L   Total Bilirubin 1.2 0.3 - 1.2 mg/dL   GFR calc non Af Amer 22 (L) >60 mL/min   GFR calc Af Amer 26 (L) >60 mL/min   Anion gap 13 5 - 15    Comment: Performed at [facility], [place] [address], [place], [place] [address]  CBC with Differential     Status: Abnormal   Collection Time: 05/07/2018  6:09 PM  Result Value Ref Range   WBC 7.9 4.0 - 10.5 K/uL   RBC 5.02 4.22 - 5.81 MIL/uL   Hemoglobin 15.5 13.0 - 17.0 g/dL   HCT 48.4 39.0 - 52.0 %   MCV 96.4 80.0 - 100.0 fL   MCH 30.9 26.0 - 34.0 pg   MCHC 32.0 30.0 - 36.0 g/dL   RDW 15.8 (H) 11.5 - 15.5 %   Platelets 223 150 - 400 K/uL   nRBC 0.0 0.0 - 0.2 %   Neutrophils Relative % 69 %   Neutro Abs 5.4 1.7 - 7.7 K/uL   Lymphocytes Relative 17 %   Lymphs Abs 1.3 0.7 - 4.0 K/uL   Monocytes Relative 12 %   Monocytes Absolute 1.0 0.1 - 1.0 K/uL   Eosinophils Relative 2 %   Eosinophils Absolute 0.1 0.0 - 0.5 K/uL   Basophils Relative 0 %   Basophils Absolute 0.0 0.0 - 0.1 K/uL   Immature Granulocytes 0 %   Abs Immature Granulocytes  0.01 0.00 - 0.07 K/uL    Comment: Performed at [facility], [place] [address] [place], [place] [address]  TSH     Status: None   Collection Time: 05/21/2018  6:09 PM  Result Value Ref Range   TSH 3.562 0.350 - 4.500 uIU/mL    Comment: Performed by a 3rd Generation assay with a functional sensitivity of <=0.01 uIU/mL. Performed at [facility], [place] [address] [place], [place] [address]   Lactic acid, plasma     Status: Abnormal   Collection Time: 05/22/2018  6:09 PM  Result Value Ref Range   Lactic Acid, Venous 2.3 (HH) 0.5 - 1.9 mmol/L    Comment: CRITICAL RESULT CALLED TO, READ BACK BY AND VERIFIED WITHLoletha Grayer [facility] 1857 05/20/2018 D BRADLEY Performed at [facility], [place] [address] [place], [place] [address]   Brain natriuretic peptide     Status: Abnormal   Collection Time: 05/15/2018  6:09 PM  Result Value Ref Range   B Natriuretic Peptide 574.0 (H) 0.0 - 100.0 pg/mL    Comment: Performed at [place] [address] [place], [place] [address]  I-Stat Troponin, ED (not at [facility])     Status: Abnormal   Collection Time: 05/17/2018  6:27 PM  Result Value Ref Range   Troponin i, poc 0.38 (HH) 0.00 - 0.08 ng/mL   Comment NOTIFIED PHYSICIAN    Comment 3            Comment: Due to the release kinetics of cTnI, a negative result within the first hours of the onset of symptoms does not rule out myocardial infarction with certainty. If myocardial infarction is still suspected, repeat the test at appropriate intervals.   Influenza panel by PCR (type A & B)     Status: None   Collection Time: 06/03/2018  9:17 PM  Result Value Ref Range   Influenza A By PCR NEGATIVE NEGATIVE   Influenza B By PCR NEGATIVE NEGATIVE    Comment: (NOTE) The Xpert Xpress Flu assay is intended as an aid in the diagnosis of  influenza and should not be used as a sole basis for treatment.  This  assay is FDA approved for nasopharyngeal swab specimens only. Nasal  washings and aspirates are unacceptable for Xpert Xpress Flu testing. Performed at [facility], [place] [address], [place], [place] [address]   Troponin I - Now Then Q6H     Status: Abnormal   Collection Time: 05/16/2018 10:02 PM  Result Value Ref Range   Troponin I 0.42 (HH) <0.03 ng/mL    Comment: CRITICAL RESULT CALLED TO, READ BACK BY AND VERIFIED WITH: [facility] RN 05/30/2018 2259  JORDANS Performed at [facility], [place] [address] [place], [place] [address]   POCT I-Stat EG7     Status: Abnormal   Collection Time: 05/20/2018 10:11 PM  Result Value Ref Range   pH, Ven 7.376 7.250 - 7.430   pCO2, Ven 38.3 (L) 44.0 - 60.0 mmHg   pO2, Ven 35.0 32.0 - 45.0 mmHg   Bicarbonate 22.7 20.0 - 28.0 mmol/L   TCO2 24 22 - 32 mmol/L   O2 Saturation 70.0 %   Acid-base deficit 3.0 (H) 0.0 - 2.0 mmol/L   Sodium 130 (L) 135 - 145 mmol/L   Potassium 4.1 3.5 - 5.1 mmol/L   Calcium, Ion 1.16 1.15 - 1.40 mmol/L   HCT 45.0 39.0 - 52.0 %   Hemoglobin  15.3 13.0 - 17.0 g/dL   Patient temperature 96.5 F    Sample type VENOUS    Comment NOTIFIED PHYSICIAN   Osmolality     Status: None   Collection Time: 05/07/2018 10:25 PM  Result Value Ref Range   Osmolality 293 275 - 295 mOsm/kg    Comment: Performed at [facility], [place] [address], [place],  [address]   Ct Head Wo Contrast  Result Date: 05/19/2018 CLINICAL DATA:  Increasing weakness, poor appetite EXAM: CT HEAD WITHOUT CONTRAST TECHNIQUE: Contiguous axial images were obtained from the base of the skull through the vertex without intravenous contrast. COMPARISON:  CT brain 02/07/2015, MRI 05/01/2013 FINDINGS: Brain: No acute territorial infarction, hemorrhage, or intracranial mass. Chronic lacunar infarct in the right thalamus. Chronic infarct in the left cerebellum. Moderate small vessel ischemic changes of the white matter. Moderate atrophy. Stable ventricle size. Vascular: No hyperdense vessels. Vertebral and carotid vascular calcification Skull: No fracture Sinuses/Orbits: Mucosal thickening in the ethmoid sinuses Other: None IMPRESSION: 1. No CT evidence for acute intracranial abnormality. 2. Atrophy and small vessel ischemic changes of the white matter. Chronic infarcts in the right thalamus and left cerebellum. Electronically Signed   By: Donavan Foil M.D.   On: 05/23/2018 19:57   Dg Chest Portable 1 View  Result  Date: 05/06/2018 CLINICAL DATA:  Cough and fall EXAM: PORTABLE CHEST 1 VIEW COMPARISON:  04/21/2018 FINDINGS: The heart size and mediastinal contours are within normal limits. There is bibasilar atelectasis without consolidation. The visualized skeletal structures are unremarkable. Unchanged position of left chest wall pacemaker leads. Remote median sternotomy and CABG. IMPRESSION: No active disease. Electronically Signed   By: Ulyses Jarred M.D.   On: 05/28/2018 18:43    Pending Labs Unresulted Labs (From admission, onward)    Start     Ordered   05/08/2018 2211  Sodium, urine, random  Once,   R     06/02/2018 2211   05/30/2018 2211  Creatinine, urine, random  Once,   R     05/08/2018 2211   05/10/2018 2211  Osmolality, urine  Once,   R     05/22/2018 2211   05/10/2018 2143  Procalcitonin  ONCE - STAT,   STAT     05/14/2018 2142   05/24/2018 2143  Blood gas, venous (at [facility])  ONCE - STAT,   STAT     06/03/2018 2142   06/01/2018 2135  Blood Culture (routine x 2)  BLOOD CULTURE X 2,   STAT     05/14/2018 2135   Signed and Held  Urine culture  ONCE - STAT,   R     Signed and Held   Signed and Held  Hemoglobin A1c  Tomorrow morning,   R    Comments:  To assess prior glycemic control    Signed and Held          Vitals/Pain Today's Vitals   05/08/2018 2117 05/10/2018 2130 05/10/2018 2245 05/18/2018 2300  BP:  97/60 91/64 (!) 72/56  Pulse:  (!) 59 62 65  Resp:  (!) 21 (!) 22 19  Temp: (!) 96.5 F (35.8 C)     TempSrc: Rectal     SpO2:  94% 93% 93%  PainSc:        Isolation Precautions No active isolations  Medications Medications  vancomycin (VANCOCIN) 1,500 mg in sodium chloride 0.9 % 500 mL IVPB (has no administration in time range)  vancomycin variable dose per unstable  renal function (pharmacist dosing) (has no administration in time range)  lactated ringers bolus 1,000 mL (0 mLs Intravenous Stopped 05/09/2018 2133)  aspirin chewable tablet 324 mg (324 mg Oral Given 05/19/2018 1854)  ALPRAZolam (XANAX)  tablet 0.25 mg (0.25 mg Oral Given 06/05/2018 1854)  cefTRIAXone (ROCEPHIN) 1 g in sodium chloride 0.9 % 100 mL IVPB (0 g Intravenous Stopped 05/18/2018 2310)  lactated ringers bolus 1,000 mL (1,000 mLs Intravenous New Bag/Given 05/07/2018 2132)    Mobility non-ambulatory High fall risk   Focused Assessments Cardiac Assessment Handoff:    Lab Results  Component Value Date   CKTOTAL 96 12/25/2009   CKMB 2.6 12/25/2009   TROPONINI 0.42 ([place]) 05/16/2018   No results found for: DDIMER Does the Patient currently have chest pain? No     R Recommendations: See Admitting Provider Note  Report given to:   Additional Notes: Pt downgraded to comfort care with gentle IV fluids and antibiotics. Family at bedside.

## 2018-05-11 NOTE — ED Provider Notes (Signed)
[facility] EMERGENCY DEPARTMENT Provider Note   CSN: [id number] Arrival date & time: 05/23/2018  1711    History   Chief Complaint Chief Complaint  Patient presents with  . Weakness  . Abnormal Lab    HPI Jon [name] is a 85 y.o. male.      Weakness  Severity:  Moderate Onset quality:  Gradual Duration:  3 days Timing:  Constant Progression:  Worsening Chronicity:  New Context: dehydration   Relieved by:  None tried Worsened by:  Nothing Ineffective treatments:  None tried Associated symptoms: difficulty walking   Associated symptoms: no abdominal pain, no arthralgias, no chest pain, no cough, no dysuria, no numbness in extremities, no fever, no foul-smelling urine, no frequency, no loss of consciousness, no seizures, no shortness of breath, no syncope and no vomiting   Risk factors: no neurologic disease   Abnormal Lab    Past Medical History:  Diagnosis Date  . Abnormal PFTs 10/30/2012   Followed in Pulmonary clinic/ [facility]/ [place]  - PFT's 10/22/12 VC  55% no obst, DLCO 50%  - PFT's 12/20/2012 VC 83% and no obst, dLCO 55%  -11/08/2012  Walked RA x 3 laps @ 185 ft each stopped due to  End of study, not desat -11/08/12 esr 10    . Abnormality of gait 04/18/2013  . Acute on chronic systolic congestive heart failure, NYHA class 2 ([place]) 03/16/2006   BNP 376.9 05/28/13   . Adjustment disorder with mixed anxiety and depressed mood 04/18/2013   Post CVA depression and anxiety   . Allergy   . Anxiety   . Aortic aneurysm of unspecified site without mention of rupture 10/27/2011  . Arthritis   . Atherosclerosis of renal artery ([place]) 10/18/2006  . Atrial fibrillation ([place]) 03/04/2011    He was on coumadin until his stroke last December and was switched to apixaban 2.'5mg'$  daily which he has been taking faithfully without reported side effects.    . Balance disorder 07/31/2014  . Basal cell carcinoma of skin of other and unspecified parts of face  12/24/2009  . Blood transfusion without reported diagnosis   . Cardiac pacemaker in situ- MDT 10/11 03/18/2010   Medtronic revo implant in October 2011. Severe sinus bradycardia in the 30s, but not truly pacemaker dependent   . Cataract   . CKD stage 2 due to type 2 diabetes mellitus ([place]) 07/03/2014  . Coronary artery disease   . CVA - Rt brain stroke 12/14 02/18/2013   02/19/13 angiography CT head:  Diffuse atherosclerotic irregularity and plaque formation of the distal right common carotid artery and proximal internal carotid artery without significant stenosis. Plaque ulceration is present and is a source of emboli. A small right thalamic CVA    . DM (diabetes mellitus), type 2 with renal complications ([place]) 06/13/8117  . Dyslipidemia 11/03/2004  . Fall 03/04/2011  . Fatigue 04/18/2013  . Gout, unspecified 11/03/2004  . Heart murmur   . History of abdominal aortic aneurysm   . History of Doppler ultrasound 05/22/2012   LEAs; R anterior tibial artery appeared occluded; L posterior tibial shows short segment of occlusive ds  . History of Doppler ultrasound 05/22/2012   Abdominal Aortic Doppler; slight increase in fusiform aneurysm   . History of echocardiogram 12/2008   EF >55%; mild concentric LVH; mild MR; mild-mod TR; mild AV regurg;   . History of nuclear stress test 12/2010   lexiscan; low risk; compared to prior study, perfusion improved  .  HTN (hypertension) 03/04/2011  . Hyperlipidemia   . Hypertension   . Hypertrophy of prostate with urinary obstruction and other lower urinary tract symptoms (LUTS) 04/28/2004  . Hyponatremia 03/04/2011  . Hypothyroidism 03/04/2011  . Impotence of organic origin 10/03/2000  . Internal hemorrhoids without mention of complication 16/96/7893  . Long term (current) use of anticoagulants 03/17/2011  . Major depressive disorder, single episode, unspecified 03/05/2009  . Memory change 01/24/2013  . Muscle weakness (generalized) 03/10/2011  . Nocturia  10/27/2011  . Obstructive sleep apnea (adult) (pediatric) 07/23/2008  . Open wound of knee, leg (except thigh), and ankle, complicated 10/14/1749  . Osteoarthritis of both knees 07/31/2014  . Osteoarthrosis, unspecified whether generalized or localized, unspecified site 02/05/2003  . PAF (paroxysmal atrial fibrillation) (HCC)    coumadin  . Pain in joint, lower leg 08/04/2011  . Pruritic condition 01/24/2013  . Quadriceps weakness 07/31/2014  . Right bundle branch block 04/19/2006  . 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 Spinal stenosis in cervical region 12/15/2004  . SSS (sick sinus syndrome) ([place]) 04/08/2014  . Stroke ([place])   . Thyroid disease   . TIA (transient ischemic attack) 04/30/2013  . Type II or unspecified type diabetes mellitus without mention of complication, not stated as uncontrolled 11/24/2004  . Unspecified vitamin D deficiency 10/18/2006  . 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 Increased exposure of the right cornea.     Patient Active Problem List   Diagnosis Date Noted  . SIRS (systemic inflammatory response syndrome) ([place]) 05/07/2018  . Acute kidney injury superimposed on chronic kidney disease ([place]) 05/09/2018  . Toe contusion 05/04/2018  . 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 Skin lesion of cheek 11/09/2017  . Counseling regarding advanced care planning and goals of care 03/15/2017  . History of sleep apnea 11/08/2016  . Daytime sleepiness 11/08/2016  . Generalized weakness 11/01/2016  . Paresthesia 06/15/2016  . Infection of eye region 02/22/2016  . Atherosclerosis of native coronary artery of native heart without angina pectoris 11/03/2015  . Ectropion of right eye 09/24/2015  . Dysuria 09/24/2015  . Loss of weight 06/11/2015  . Incontinent of urine 05/25/2015  . Pilonidal cyst 04/09/2015  . AAA (abdominal aortic aneurysm) ([place]) 04/07/2015  . Right heart failure ([place]) 09/03/2014  .  Osteoarthritis of both knees 07/31/2014  . Quadriceps weakness 07/31/2014  . Dyspnea 07/03/2014  . DM (diabetes mellitus), type 2 with renal complications ([place]) 02/58/5277  . CKD (chronic kidney disease) stage 2, GFR 60-89 ml/min 07/03/2014  . Lower back pain 05/29/2014  . SSS (sick sinus syndrome) ([place]) 04/08/2014  . Constipation 03/27/2014  . Fecal incontinence 02/17/2014  . Chronic systolic congestive heart failure ([place]) 02/17/2014  . Urgency of urination 02/13/2014  . Cerebral infarction due to embolism of cerebral artery ([place]) 01/06/2014  . Hyponatremia 12/26/2013  . GERD (gastroesophageal reflux disease) 10/24/2013  . Epistaxis, recurrent 10/22/2013  . Dysphagia 10/16/2013  . Foot spasms 09/12/2013  . Rhinorrhea 08/15/2013  . Edema 06/01/2013  . History of CVA (cerebrovascular accident) 04/30/2013  . 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 Abnormality of gait 04/18/2013  . Adjustment disorder with mixed anxiety and depressed mood 04/18/2013  . Recurrent mild major depressive disorder with anxiety ([place]) 04/11/2013  . 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 CVA - Rt brain stroke 12/14 02/18/2013  . Memory change 01/24/2013  . Pruritic condition 01/24/2013  . Abnormal PFTs  10/30/2012  . Allergic rhinitis 09/13/2012  . Aortic aneurysm ([place]) 10/27/2011  . Nocturia 10/27/2011  . Pain in left knee 08/04/2011  . Long term current use of anticoagulant therapy 03/17/2011  . Muscle weakness (generalized) 03/10/2011  . Aspiration pneumonia ([place]) 03/04/2011  . Atrial fibrillation ([place]) 03/04/2011  . Fall 03/04/2011  . HTN (hypertension) 03/04/2011  . Hypothyroidism 03/04/2011  . Internal hemorrhoids without mention of complication 52/84/1324  . Cardiac pacemaker in situ- MDT 10/11 03/18/2010  . Basal cell carcinoma of skin of other and unspecified parts of face 12/24/2009  . Unspecified sinusitis (chronic) 07/01/2009  . Syncope and collapse 01/19/2009  .  Sleep apnea 07/23/2008  . Vitamin D deficiency 10/18/2006  . Atherosclerosis of renal artery ([place]) 10/18/2006  . Pain in joint, shoulder region 10/18/2006  . Right bundle branch block 04/19/2006  . Urinary frequency 03/16/2006  . Spinal stenosis in cervical region 12/15/2004  . Diabetes mellitus, type 2, with circulatory disorder ([place]) 11/24/2004  . Dyslipidemia 11/03/2004  . Gout 11/03/2004  . Coronary atherosclerosis 11/03/2004  . Enlarged prostate with lower urinary tract symptoms (LUTS) 04/28/2004  . Osteoarthritis 02/05/2003  . Impotence, organic 10/03/2000    Past Surgical History:  Procedure Laterality Date  . CORONARY ANGIOPLASTY WITH STENT PLACEMENT  2008   stent to SVG to OM  . CORONARY ARTERY BYPASS GRAFT  1989  . EYE SURGERY    . HERNIA REPAIR    . INSERT / REPLACE / REMOVE PACEMAKER    . JOINT REPLACEMENT    . PACEMAKER INSERTION  2011        Home Medications    Prior to Admission medications   Medication Sig Start Date End Date Taking? Authorizing Provider  acetaminophen (TYLENOL) 325 MG tablet Take 650 mg by mouth every 4 (four) hours as needed for mild pain or headache (NOT TO EXCEED 3,000 mg/24 hours).   Yes [provider]  allopurinol (ZYLOPRIM) 100 MG tablet Take 50 mg by mouth daily.    Yes [provider]  ALPRAZolam (XANAX) 0.25 MG tablet Take 1 tablet (0.25 mg total) by mouth 2 (two) times daily as needed for anxiety. 04/20/18  Yes Mast, Man X, NP  amoxicillin (AMOXIL) 500 MG tablet Take 2,000 mg by mouth See admin instructions. Take 2,000 mg by mouth 1 hour prior to dental appointments   Yes [provider]  apixaban (ELIQUIS) 2.5 MG TABS tablet Take 1 tablet (2.5 mg total) by mouth 2 (two) times daily. 10/22/13  Yes Hilty, Nadean Corwin, MD  calcium carbonate (TUMS - DOSED IN MG ELEMENTAL CALCIUM) 500 MG chewable tablet Chew 1 tablet by mouth 3 (three) times daily as needed for indigestion or heartburn.    Yes [provider]  Carboxymethylcellulose Sodium (THERATEARS OP) Place 1 drop into both eyes daily as needed (DRY EYES).    Yes [provider]  Cholecalciferol (VITAMIN D3) 50 MCG (2000 UT) TABS Take 2,000 Units by mouth daily.   Yes [provider]  furosemide (LASIX) 20 MG tablet Take 20 mg by mouth daily as needed (for a weight gain >3 pounds/24 hours or 5 pounds/week).    Yes [provider]  ipratropium (ATROVENT) 0.06 % nasal spray Place 1 spray into both nostrils 3 (three) times daily as needed for rhinitis.   Yes [provider]  ipratropium-albuterol (DUONEB) 0.5-2.5 (3) MG/3ML SOLN Take 3 mLs by nebulization 4 (four) times daily as needed (SHORTNESS OF BREATH).    Yes [provider]  levothyroxine (SYNTHROID, LEVOTHROID) 125 MCG tablet Take 125 mcg by mouth daily before breakfast.   Yes [provider]  losartan (COZAAR) 25 MG tablet Take 1 tablet (25 mg total) by mouth daily. 11/03/15  Yes Hilty, Nadean Corwin, MD  meclizine (ANTIVERT) 25 MG tablet Take 25 mg by mouth every 6 (six) hours as needed for dizziness.    Yes [provider]  Menthol, Topical Analgesic, (BIOFREEZE) 4 % GEL Apply 1 application topically 4 (four) times daily as needed (left knee pain).    Yes [provider]  metoprolol tartrate (LOPRESSOR) 25 MG tablet Take 1 tablet (25 mg total) by mouth 2 (two) times daily. 08/10/16  Yes Croitoru, Mihai, MD  neomycin-bacitracin-polymyxin (NEOSPORIN) [id number] ointment Apply 1 application topically daily as needed (both nares for dryness).    Yes [provider]  nitroGLYCERIN (NITROSTAT) 0.4 MG SL tablet Place 0.4 mg under the tongue every 5 (five) minutes x 3 doses as needed for chest pain.    Yes [provider]  polyethylene glycol (MIRALAX / GLYCOLAX) packet Take 17 g by mouth daily.   Yes [provider]  tobramycin-dexamethasone (TOBRADEX) ophthalmic ointment Place 1 application into  both eyes 2 (two) times daily.   Yes [provider]  triamcinolone cream (KENALOG) 0.1 % Apply 1 application topically daily. GROIN AREA   Yes [provider]  White Petrolatum-Mineral Oil (RETAINE PM) OINT Place 1 application into both eyes as directed.   Yes [provider]  Dextromethorphan-guaiFENesin (ROBAFEN DM) 10-100 MG/5ML liquid Give Robafen DM syrup 10 mL by mouth every 6 hours PRN for cough X 48 hours (Use sugar-free for diabetic patients.) Notify MD of continued cough.    [provider]  Humidifiers ([employer] COOL MIST HUMIDIFER) MISC by Does not apply route. RES. STATES DOES NOT USE HUMIDIFIER IN THE SUMMER MONTHS/REQUEST THIS ORDER CHANGED TO PRN: ONCE HUMIDIFIER IS BACK IN USE, STAFF TO CHECK EVERY SHIFT/FILLED WITH WATER AS NEEDED    [provider]    Family History Family History  Problem Relation Age of Onset  . Diabetes Father   . Other Mother        PAD with amputations    Social History Social History   Tobacco Use  . Smoking status: Former Smoker    Packs/day: 0.75    Years: 15.00    Pack years: 11.25    Types: Cigarettes    Last attempt to quit: 03/04/1967    Years since quitting: 51.2  . Smokeless tobacco: Never Used  Substance Use Topics  . Alcohol use: No    Alcohol/week: 0.0 standard drinks  . Drug use: No     Allergies   Quinolones; Caduet [amlodipine-atorvastatin]; Crestor [rosuvastatin]; Lipitor [atorvastatin calcium]; Simvastatin; Norvasc [amlodipine]; Pollen extract; and Lisinopril   Review of Systems Review of Systems  Constitutional: Positive for activity change, appetite change and fatigue. Negative for chills and fever.  HENT: Negative for ear pain and sore throat.   Eyes: Negative for pain and visual disturbance.  Respiratory: Negative for cough and shortness of breath.   Cardiovascular: Negative for chest pain, palpitations and syncope.  Gastrointestinal: Negative for abdominal pain and  vomiting.  Genitourinary: Negative for dysuria, frequency and hematuria.  Musculoskeletal: Positive for gait problem. Negative for arthralgias and back pain.  Skin: Negative for color change and rash.  Neurological: Positive for weakness. Negative for seizures, loss of consciousness, syncope and numbness.  All other systems reviewed and are  negative.    Physical Exam Updated Vital Signs BP (!) 80/54   Pulse 60   Temp (!) 96.5 F (35.8 C) (Rectal)   Resp 18   SpO2 93%   Physical Exam Vitals signs and nursing note reviewed.  Constitutional:      Appearance: He is well-developed.     Comments: Patient resting comfortably, no acute distress.  HENT:     Head: Normocephalic and atraumatic.  Eyes:     Conjunctiva/sclera: Conjunctivae normal.  Neck:     Musculoskeletal: Neck supple.  Cardiovascular:     Rate and Rhythm: Normal rate and regular rhythm.     Heart sounds: No murmur.  Pulmonary:     Effort: Pulmonary effort is normal. No respiratory distress.     Breath sounds: Normal breath sounds.  Abdominal:     Palpations: Abdomen is soft.     Tenderness: There is no abdominal tenderness.  Skin:    General: Skin is warm and dry.     Capillary Refill: Capillary refill takes less than 2 seconds.  Neurological:     General: No focal deficit present.     Mental Status: He is alert and oriented to person, place, and time. Mental status is at baseline.     Cranial Nerves: No cranial nerve deficit.     Sensory: No sensory deficit.     Motor: No weakness.     Coordination: Coordination normal.     Gait: Gait normal.     Deep Tendon Reflexes: Reflexes normal.     Comments: Baseline neurological exam negative.  Psychiatric:        Mood and Affect: Mood normal.      ED Treatments / Results  Labs (all labs ordered are listed, but only abnormal results are displayed) Labs Reviewed  URINALYSIS, ROUTINE W REFLEX MICROSCOPIC - Abnormal; Notable for the following components:       Result Value   Protein, ur 30 (*)    Bacteria, UA RARE (*)    All other components within normal limits  COMPREHENSIVE METABOLIC PANEL - Abnormal; Notable for the following components:   Sodium 131 (*)    Chloride 97 (*)    CO2 21 (*)    Glucose, Bld 120 (*)    BUN 76 (*)    Creatinine, Ser 2.42 (*)    Total Protein 5.9 (*)    Albumin 3.2 (*)    GFR calc non Af Amer 22 (*)    GFR calc Af Amer 26 (*)    All other components within normal limits  CBC WITH DIFFERENTIAL/PLATELET - Abnormal; Notable for the following components:   RDW 15.8 (*)    All other components within normal limits  LACTIC ACID, PLASMA - Abnormal; Notable for the following components:   Lactic Acid, Venous 2.3 (*)    All other components within normal limits  BRAIN NATRIURETIC PEPTIDE - Abnormal; Notable for the following components:   B Natriuretic Peptide 574.0 (*)    All other components within normal limits  TROPONIN I - Abnormal; Notable for the following components:   Troponin I 0.42 (*)    All other components within normal limits  I-STAT TROPONIN, ED - Abnormal; Notable for the following components:   Troponin i, poc 0.38 (*)    All other components within normal limits  POCT I-STAT EG7 - Abnormal; Notable for the following components:   pCO2, Ven 38.3 (*)    Acid-base deficit 3.0 (*)    Sodium  130 (*)    All other components within normal limits  CULTURE, BLOOD (ROUTINE X 2)  CULTURE, BLOOD (ROUTINE X 2)  URINE CULTURE  TSH  INFLUENZA PANEL BY PCR (TYPE A & B)  PROCALCITONIN  OSMOLALITY  BLOOD GAS, VENOUS  SODIUM, URINE, RANDOM  CREATININE, URINE, RANDOM  OSMOLALITY, URINE  HEMOGLOBIN A1C    EKG EKG Interpretation  Date/Time:  Friday May 11 2018 17:28:10 EST Ventricular Rate:  64 PR Interval:    QRS Duration: 144 QT Interval:  431 QTC Calculation: 445 R Axis:   174 Text Interpretation:  Age not entered, assumed to be  85 years old for purpose of ECG interpretation Atrial  fibrillation Ventricular premature complex Nonspecific intraventricular conduction delay Repol abnrm, severe global ischemia (LM/MVD) Confirmed by Elnora Morrison [phone]) on 05/06/2018 9:32:29 PM   Radiology Ct Head Wo Contrast  Result Date: 06/01/2018 CLINICAL DATA:  Increasing weakness, poor appetite EXAM: CT HEAD WITHOUT CONTRAST TECHNIQUE: Contiguous axial images were obtained from the base of the skull through the vertex without intravenous contrast. COMPARISON:  CT brain 02/07/2015, MRI 05/01/2013 FINDINGS: Brain: No acute territorial infarction, hemorrhage, or intracranial mass. Chronic lacunar infarct in the right thalamus. Chronic infarct in the left cerebellum. Moderate small vessel ischemic changes of the white matter. Moderate atrophy. Stable ventricle size. Vascular: No hyperdense vessels. Vertebral and carotid vascular calcification Skull: No fracture Sinuses/Orbits: Mucosal thickening in the ethmoid sinuses Other: None IMPRESSION: 1. No CT evidence for acute intracranial abnormality. 2. Atrophy and small vessel ischemic changes of the white matter. Chronic infarcts in the right thalamus and left cerebellum. Electronically Signed   By: Donavan Foil M.D.   On: 05/08/2018 19:57   Dg Chest Portable 1 View  Result Date: 05/25/2018 CLINICAL DATA:  Cough and fall EXAM: PORTABLE CHEST 1 VIEW COMPARISON:  04/21/2018 FINDINGS: The heart size and mediastinal contours are within normal limits. There is bibasilar atelectasis without consolidation. The visualized skeletal structures are unremarkable. Unchanged position of left chest wall pacemaker leads. Remote median sternotomy and CABG. IMPRESSION: No active disease. Electronically Signed   By: Ulyses Jarred M.D.   On: 05/22/2018 18:43    Procedures Procedures (including critical care time)  Medications Ordered in ED Medications  vancomycin (VANCOCIN) 1,500 mg in sodium chloride 0.9 % 500 mL IVPB (1,500 mg Intravenous New Bag/Given 05/10/2018 2320)    vancomycin variable dose per unstable renal function (pharmacist dosing) (has no administration in time range)  metroNIDAZOLE (FLAGYL) IVPB 500 mg (has no administration in time range)  insulin aspart (novoLOG) injection 0-9 Units (has no administration in time range)  acetaminophen (TYLENOL) tablet 650 mg (has no administration in time range)    Or  acetaminophen (TYLENOL) suppository 650 mg (has no administration in time range)  HYDROcodone-acetaminophen (NORCO/VICODIN) 5-325 MG per tablet 1-2 tablet (has no administration in time range)  ondansetron (ZOFRAN) tablet 4 mg (has no administration in time range)    Or  ondansetron (ZOFRAN) injection 4 mg (has no administration in time range)  0.9 %  sodium chloride infusion (has no administration in time range)  albuterol (PROVENTIL) (2.5 MG/3ML) 0.083% nebulizer solution 2.5 mg (has no administration in time range)  morphine 2 MG/ML injection 1-2 mg (has no administration in time range)  LORazepam (ATIVAN) injection 0.5 mg (has no administration in time range)  ceFEPIme (MAXIPIME) 1 g in sodium chloride 0.9 % 100 mL IVPB (has no administration in time range)  lactated ringers bolus 1,000 mL (0 mLs Intravenous  Stopped 05/06/2018 2133)  aspirin chewable tablet 324 mg (324 mg Oral Given 05/26/2018 1854)  ALPRAZolam (XANAX) tablet 0.25 mg (0.25 mg Oral Given 05/20/2018 1854)  cefTRIAXone (ROCEPHIN) 1 g in sodium chloride 0.9 % 100 mL IVPB (0 g Intravenous Stopped 05/17/2018 2310)  lactated ringers bolus 1,000 mL (1,000 mLs Intravenous New Bag/Given 05/16/2018 2132)     Initial Impression / Assessment and Plan / ED Course  I have reviewed the triage vital signs and the nursing notes.  Pertinent labs & imaging results that were available during my care of the patient were reviewed by me and considered in my medical decision making (see chart for details).        85 year old male presents w/ systolic heart failure, Afib, DM2, HTN, GERD, constipation, anxiety  who comes in to the ED with failure to thrive over the last 2 weeks with increased weakness, decreased ability to ambulate and significant change in ADLs.  Patient has been off his furosemide for the last 3 days due to dehydration and laboratory studies at living facility indicating AKI with increasing BUN and creatinine.  Patient denies any chest pain, shortness of breath, dysuria or infectious-like symptoms.  Past medical history reviewed, listed above.  Past documentation reviewed.  GCS 15, mentating appropriately.  Physical exam consistent with no unilateral neurological findings, mild generalized weakness. Mildly hypotensive with systolics in the 22L, mentation intact.  Will do basic laboratory studies as well as head CT to rule out hydrocephalus and other acute etiologies which could be causing stability issues and failure to thrive.  In addition laboratory studies, urinalysis and chest x-ray.  EKG description above; no acute STEMI at this time.  Patient does not meet sepsis criteria at this time.  However infection possible as well as cardiac etiology with the patient's past medical history of CABG.  Rectal temperature indicates 96.5; lactate 2.3, no leukocytosis, elevated troponin.  Based on vital signs as well as laboratory studies will initiate sepsis protocol and broaden antibiotic coverage with vancomycin.  Sepsis - Repeat Assessment  Performed at:    [facility]     Blood pressure (!) 80/54, pulse 60, temperature (!) 96.5 F (35.8 C), temperature source Rectal, resp. rate 18, SpO2 93 %.  Heart:     Regular rate and rhythm  Lungs:    CTA  Capillary Refill:   <2 sec  Peripheral Pulse:   Radial pulse palpable  Skin:     Pale  Consult cardiology due to possible cardiogenic shock.  Cardiology assessed the patient and believe that the patient was likely volume down due to recent failure to thrive.  We will give additional fluid bolus here in the emergency department.  Bedside  ultrasound shows appropriate cardiac function as well as approximately 20% collapse of the IVC indicating that the patient is not severely dehydrated at this time however could utilize more fluids.  Cardiology in agreement.  Discussed with inpatient team regarding admission to the stepdown unit due to fluctuation of blood pressure with fluid hydration.  Possible infection, cardiac etiology at this time due to decreased perfusion.  Care assumed here in the emergency department by inpatient team.  The above care was discussed and agreed upon by my attending physician.  Final Clinical Impressions(s) / ED Diagnoses   Final diagnoses:  Generalized weakness  Sepsis, due to unspecified organism, unspecified whether acute organ dysfunction present [facility])    ED Discharge Orders    None       [place],  Hilliard Clark, MD 05/09/2018 [id number]    Elnora Morrison, MD 05-18-2018 [phone]

## 2018-05-12 DIAGNOSIS — Z7189 Other specified counseling: Secondary | ICD-10-CM

## 2018-05-12 DIAGNOSIS — I248 Other forms of acute ischemic heart disease: Secondary | ICD-10-CM

## 2018-05-12 DIAGNOSIS — Z951 Presence of aortocoronary bypass graft: Secondary | ICD-10-CM | POA: Diagnosis not present

## 2018-05-12 DIAGNOSIS — R531 Weakness: Secondary | ICD-10-CM

## 2018-05-12 DIAGNOSIS — Z515 Encounter for palliative care: Secondary | ICD-10-CM | POA: Diagnosis present

## 2018-05-12 DIAGNOSIS — I5022 Chronic systolic (congestive) heart failure: Secondary | ICD-10-CM | POA: Diagnosis not present

## 2018-05-12 DIAGNOSIS — I130 Hypertensive heart and chronic kidney disease with heart failure and stage 1 through stage 4 chronic kidney disease, or unspecified chronic kidney disease: Secondary | ICD-10-CM | POA: Diagnosis present

## 2018-05-12 DIAGNOSIS — A419 Sepsis, unspecified organism: Secondary | ICD-10-CM

## 2018-05-12 DIAGNOSIS — I4819 Other persistent atrial fibrillation: Secondary | ICD-10-CM

## 2018-05-12 DIAGNOSIS — J449 Chronic obstructive pulmonary disease, unspecified: Secondary | ICD-10-CM | POA: Diagnosis present

## 2018-05-12 DIAGNOSIS — E1122 Type 2 diabetes mellitus with diabetic chronic kidney disease: Secondary | ICD-10-CM | POA: Diagnosis present

## 2018-05-12 DIAGNOSIS — I2510 Atherosclerotic heart disease of native coronary artery without angina pectoris: Secondary | ICD-10-CM | POA: Diagnosis present

## 2018-05-12 DIAGNOSIS — I495 Sick sinus syndrome: Secondary | ICD-10-CM | POA: Diagnosis present

## 2018-05-12 DIAGNOSIS — I959 Hypotension, unspecified: Secondary | ICD-10-CM | POA: Diagnosis present

## 2018-05-12 DIAGNOSIS — Z9181 History of falling: Secondary | ICD-10-CM | POA: Diagnosis not present

## 2018-05-12 DIAGNOSIS — E039 Hypothyroidism, unspecified: Secondary | ICD-10-CM | POA: Diagnosis present

## 2018-05-12 DIAGNOSIS — G4730 Sleep apnea, unspecified: Secondary | ICD-10-CM | POA: Diagnosis present

## 2018-05-12 DIAGNOSIS — Z7901 Long term (current) use of anticoagulants: Secondary | ICD-10-CM | POA: Diagnosis not present

## 2018-05-12 DIAGNOSIS — F419 Anxiety disorder, unspecified: Secondary | ICD-10-CM | POA: Diagnosis present

## 2018-05-12 DIAGNOSIS — E871 Hypo-osmolality and hyponatremia: Secondary | ICD-10-CM | POA: Diagnosis present

## 2018-05-12 DIAGNOSIS — I4821 Permanent atrial fibrillation: Secondary | ICD-10-CM | POA: Diagnosis present

## 2018-05-12 DIAGNOSIS — R6510 Systemic inflammatory response syndrome (SIRS) of non-infectious origin without acute organ dysfunction: Secondary | ICD-10-CM | POA: Diagnosis present

## 2018-05-12 DIAGNOSIS — E785 Hyperlipidemia, unspecified: Secondary | ICD-10-CM | POA: Diagnosis present

## 2018-05-12 DIAGNOSIS — N189 Chronic kidney disease, unspecified: Secondary | ICD-10-CM | POA: Diagnosis not present

## 2018-05-12 DIAGNOSIS — Z66 Do not resuscitate: Secondary | ICD-10-CM | POA: Diagnosis present

## 2018-05-12 DIAGNOSIS — K219 Gastro-esophageal reflux disease without esophagitis: Secondary | ICD-10-CM | POA: Diagnosis present

## 2018-05-12 DIAGNOSIS — E860 Dehydration: Secondary | ICD-10-CM | POA: Diagnosis present

## 2018-05-12 DIAGNOSIS — N182 Chronic kidney disease, stage 2 (mild): Secondary | ICD-10-CM | POA: Diagnosis present

## 2018-05-12 DIAGNOSIS — N179 Acute kidney failure, unspecified: Secondary | ICD-10-CM | POA: Diagnosis not present

## 2018-05-12 LAB — GLUCOSE, CAPILLARY: Glucose-Capillary: 136 mg/dL — ABNORMAL HIGH (ref 70–99)

## 2018-05-12 LAB — OSMOLALITY, URINE: Osmolality, Ur: 444 mOsm/kg (ref 300–900)

## 2018-05-12 LAB — SODIUM, URINE, RANDOM: Sodium, Ur: <10 mmol/L

## 2018-05-12 MED ORDER — HYDROCODONE-ACETAMINOPHEN 5-325 MG PO TABS: 1.0000 | ORAL_TABLET | Freq: Four times a day (QID) | ORAL | Status: DC | PRN

## 2018-05-12 MED ORDER — TOBRAMYCIN-DEXAMETHASONE 0.3-0.1 % OP OINT
1.0000 "application " | TOPICAL_OINTMENT | Freq: Two times a day (BID) | OPHTHALMIC | Status: DC
  Administered 2018-05-12: 1 via OPHTHALMIC
  Filled 2018-05-12: qty 3.5

## 2018-05-12 MED ORDER — LEVOTHYROXINE SODIUM 25 MCG PO TABS: 125.0000 ug | ORAL_TABLET | Freq: Every day | ORAL | Status: DC

## 2018-05-12 MED ORDER — IPRATROPIUM-ALBUTEROL 0.5-2.5 (3) MG/3ML IN SOLN: 3.0000 mL | Freq: Four times a day (QID) | RESPIRATORY_TRACT | Status: DC | PRN

## 2018-05-12 MED ORDER — LORAZEPAM 2 MG/ML IJ SOLN
0.2500 mg | Freq: Three times a day (TID) | INTRAMUSCULAR | Status: DC | PRN
  Administered 2018-05-12: 0.25 mg via INTRAVENOUS
  Filled 2018-05-12: qty 1

## 2018-05-12 MED ORDER — SODIUM CHLORIDE 0.9 % IV SOLN
INTRAVENOUS | Status: DC
  Administered 2018-05-12: 12:00:00 via INTRAVENOUS

## 2018-05-12 MED ORDER — MECLIZINE HCL 25 MG PO TABS: 25.0000 mg | ORAL_TABLET | Freq: Four times a day (QID) | ORAL | Status: DC | PRN

## 2018-05-12 MED ORDER — SODIUM CHLORIDE 0.9 % IV SOLN
1.5000 g | Freq: Two times a day (BID) | INTRAVENOUS | Status: DC
  Administered 2018-05-12 (×2): 1.5 g via INTRAVENOUS
  Filled 2018-05-12 (×3): qty 1.5

## 2018-05-12 NOTE — Progress Notes (Addendum)
DAILY PROGRESS NOTE   Patient Name: Jon [name] Date of Encounter: 05/12/2018 Cardiologist: Pixie Casino, MD  Chief Complaint   somnolent  Patient Profile   85 yo male with permanent afib, SSS s/p PM, chronic systolic and right heart failure, CAD s/p CABG, PAD, recent thalamic stroke and saccular infrarenal aortic aneursym, presents with hypotension, weakness and AKI.  Subjective   Rapid decline over the past 24 hours - BP improved today with hydration. Labs not being monitored as per the patient request.   Objective   Vitals:   05/12/2018 2330 05/12/18 0018 05/12/18 0400 05/12/18 0611  BP: (!) 80/54 96/66  103/67  Pulse: 60 63  60  Resp: 18 16  20   Temp:    (!) 97.3 F (36.3 C)  TempSrc:    Oral  SpO2: 93% 98%  98%  Weight:  77.1 kg 77.1 kg   Height:  5\' 8"  (1.727 m) 5\' 8"  (1.727 m)     Intake/Output Summary (Last 24 hours) at 05/12/2018 1234 Last data filed at 05/12/2018 0813 Gross per 24 hour  Intake 1300 ml  Output -  Net 1300 ml   Filed Weights   05/12/18 0018 05/12/18 0400  Weight: 77.1 kg 77.1 kg    Physical Exam   General appearance: alert and no distress Neck: no carotid bruit, no JVD and thyroid not enlarged, symmetric, no tenderness/mass/nodules Lungs: clear to auscultation bilaterally Heart: regular rate and rhythm Abdomen: soft, non-tender; bowel sounds normal; no masses,  no organomegaly Extremities: extremities normal, atraumatic, no cyanosis or edema Pulses: 2+ and symmetric Skin: Skin color, texture, turgor normal. No rashes or lesions Neurologic: Grossly normal Psych: Pleasant  Inpatient Medications    Scheduled Meds: . insulin aspart  0-9 Units Subcutaneous Q4H  . levothyroxine  125 mcg Oral Q0600  . tobramycin-dexamethasone  1 application Both Eyes BID    Continuous Infusions: . sodium chloride 50 mL/hr at 05/12/18 1211  . ampicillin-sulbactam (UNASYN) IV 1.5 g (05/12/18 1000)    PRN Meds: acetaminophen **OR**  acetaminophen, albuterol, HYDROcodone-acetaminophen, ipratropium-albuterol, LORazepam, meclizine, morphine injection, [DISCONTINUED] ondansetron **OR** ondansetron (ZOFRAN) IV   Labs   Results for orders placed or performed during the hospital encounter of 05/25/2018 (from the past 48 hour(s))  Urinalysis, Routine w reflex microscopic     Status: Abnormal   Collection Time: 05/07/2018  6:09 PM  Result Value Ref Range   Color, Urine YELLOW YELLOW   APPearance CLEAR CLEAR   Specific Gravity, Urine 1.015 1.005 - 1.030   pH 5.0 5.0 - 8.0   Glucose, UA NEGATIVE NEGATIVE mg/dL   Hgb urine dipstick NEGATIVE NEGATIVE   Bilirubin Urine NEGATIVE NEGATIVE   Ketones, ur NEGATIVE NEGATIVE mg/dL   Protein, ur 30 (A) NEGATIVE mg/dL   Nitrite NEGATIVE NEGATIVE   Leukocytes,Ua NEGATIVE NEGATIVE   WBC, UA 0-5 0 - 5 WBC/hpf   Bacteria, UA RARE (A) NONE SEEN   Squamous Epithelial / LPF 0-5 0 - 5   Hyaline Casts, UA PRESENT     Comment: Performed at [facility], [address] [place], [place] [address]  Comprehensive metabolic panel     Status: Abnormal   Collection Time: 05/31/2018  6:09 PM  Result Value Ref Range   Sodium 131 (L) 135 - 145 mmol/L   Potassium 4.7 3.5 - 5.1 mmol/L   Chloride 97 (L) 98 - 111 mmol/L   CO2 21 (L) 22 - 32 mmol/L   Glucose, Bld 120 (H)  70 - 99 mg/dL   BUN 76 (H) 8 - 23 mg/dL   Creatinine, Ser 2.42 (H) 0.61 - 1.24 mg/dL   Calcium 9.0 8.9 - 10.3 mg/dL   Total Protein 5.9 (L) 6.5 - 8.1 g/dL   Albumin 3.2 (L) 3.5 - 5.0 g/dL   AST 33 15 - 41 U/L   ALT 19 0 - 44 U/L   Alkaline Phosphatase 94 38 - 126 U/L   Total Bilirubin 1.2 0.3 - 1.2 mg/dL   GFR calc non Af Amer 22 (L) >60 mL/min   GFR calc Af Amer 26 (L) >60 mL/min   Anion gap 13 5 - 15    Comment: Performed at [facility] [address] [place], [place] [address]  CBC with Differential     Status: Abnormal   Collection Time: 05/20/2018  6:09 PM  Result Value Ref Range   WBC 7.9 4.0 - 10.5 K/uL   RBC  5.02 4.22 - 5.81 MIL/uL   Hemoglobin 15.5 13.0 - 17.0 g/dL   HCT 48.4 39.0 - 52.0 %   MCV 96.4 80.0 - 100.0 fL   MCH 30.9 26.0 - 34.0 pg   MCHC 32.0 30.0 - 36.0 g/dL   RDW 15.8 (H) 11.5 - 15.5 %   Platelets 223 150 - 400 K/uL   nRBC 0.0 0.0 - 0.2 %   Neutrophils Relative % 69 %   Neutro Abs 5.4 1.7 - 7.7 K/uL   Lymphocytes Relative 17 %   Lymphs Abs 1.3 0.7 - 4.0 K/uL   Monocytes Relative 12 %   Monocytes Absolute 1.0 0.1 - 1.0 K/uL   Eosinophils Relative 2 %   Eosinophils Absolute 0.1 0.0 - 0.5 K/uL   Basophils Relative 0 %   Basophils Absolute 0.0 0.0 - 0.1 K/uL   Immature Granulocytes 0 %   Abs Immature Granulocytes 0.01 0.00 - 0.07 K/uL    Comment: Performed at [facility] [address], [place], [place] [address]  TSH     Status: None   Collection Time: 05/29/2018  6:09 PM  Result Value Ref Range   TSH 3.562 0.350 - 4.500 uIU/mL    Comment: Performed by a 3rd Generation assay with a functional sensitivity of <=0.01 uIU/mL. Performed at [facility], [place] [address] [place], [place] [address]   Lactic acid, plasma     Status: Abnormal   Collection Time: 06/03/2018  6:09 PM  Result Value Ref Range   Lactic Acid, Venous 2.3 (HH) 0.5 - 1.9 mmol/L    Comment: CRITICAL RESULT CALLED TO, READ BACK BY AND VERIFIED WITHLoletha Grayer [facility] 1857 05/06/2018 D BRADLEY Performed at [facility], [place] [address], [place], [place] [address]   Brain natriuretic peptide     Status: Abnormal   Collection Time: 05/09/2018  6:09 PM  Result Value Ref Range   B Natriuretic Peptide 574.0 (H) 0.0 - 100.0 pg/mL    Comment: Performed at [facility] [address], [place], [place] [address]  Sodium, urine, random     Status: None   Collection Time: 06/04/2018  6:09 PM  Result Value Ref Range   Sodium, Ur <10 mmol/L    Comment: REPEATED TO VERIFY Performed at [facility], [place] [address], [place], [place] [address]   Osmolality, urine     Status: None   Collection Time:  06/01/2018  6:09 PM  Result Value Ref Range   Osmolality, Ur  444 300 - 900 mOsm/kg    Comment: Performed at [facility], [place] [address], [place], [place] [address]  I-Stat Troponin, ED (not at [facility])     Status: Abnormal   Collection Time: 05/18/2018  6:27 PM  Result Value Ref Range   Troponin i, poc 0.38 (HH) 0.00 - 0.08 ng/mL   Comment NOTIFIED PHYSICIAN    Comment 3            Comment: Due to the release kinetics of cTnI, a negative result within the first hours of the onset of symptoms does not rule out myocardial infarction with certainty. If myocardial infarction is still suspected, repeat the test at appropriate intervals.   Influenza panel by PCR (type A & B)     Status: None   Collection Time: 05/15/2018  9:17 PM  Result Value Ref Range   Influenza A By PCR NEGATIVE NEGATIVE   Influenza B By PCR NEGATIVE NEGATIVE    Comment: (NOTE) The Xpert Xpress Flu assay is intended as an aid in the diagnosis of  influenza and should not be used as a sole basis for treatment.  This  assay is FDA approved for nasopharyngeal swab specimens only. Nasal  washings and aspirates are unacceptable for Xpert Xpress Flu testing. Performed at [facility], [place] [address], [place], [place] [address]   Blood Culture (routine x 2)     Status: None (Preliminary result)   Collection Time: 05/16/2018  9:50 PM  Result Value Ref Range   Specimen Description BLOOD RIGHT FOREARM    Special Requests      BOTTLES DRAWN AEROBIC AND ANAEROBIC Blood Culture adequate volume   Culture      NO GROWTH < 12 HOURS Performed at [facility], [place] [address], [place], [place] [address]    Report Status PENDING   Blood Culture (routine x 2)     Status: None (Preliminary result)   Collection Time: 05/24/2018  9:50 PM  Result Value Ref Range   Specimen Description BLOOD RIGHT HAND    Special Requests      BOTTLES DRAWN AEROBIC AND ANAEROBIC Blood Culture results may not be optimal due to an inadequate  volume of blood received in culture bottles   Culture      NO GROWTH < 12 HOURS Performed at [facility] [address] [place], [place] [address]    Report Status PENDING   Procalcitonin     Status: None   Collection Time: 05/31/2018 10:02 PM  Result Value Ref Range   Procalcitonin <0.10 ng/mL    Comment:        Interpretation: PCT (Procalcitonin) <= 0.5 ng/mL: Systemic infection (sepsis) is not likely. Local bacterial infection is possible. REPEATED TO VERIFY (NOTE)       Sepsis PCT Algorithm           Lower Respiratory Tract                                      Infection PCT Algorithm    ----------------------------     ----------------------------         PCT < 0.25 ng/mL                PCT < 0.10 ng/mL         Strongly encourage  Strongly discourage   discontinuation of antibiotics    initiation of antibiotics    ----------------------------     -----------------------------       PCT 0.25 - 0.50 ng/mL            PCT 0.10 - 0.25 ng/mL               OR       >80% decrease in PCT            Discourage initiation of                                            antibiotics      Encourage discontinuation           of antibiotics    ----------------------------     -----------------------------         PCT >= 0.50 ng/mL              PCT 0.26 - 0.50 ng/mL                AND       <80% decrease in PCT             Encourage initiation of                                             antibiotics       Encourage continuation           of antibiotics    ----------------------------     -----------------------------        PCT >= 0.50 ng/mL                  PCT > 0.50 ng/mL               AND         increase in PCT                  Strongly encourage                                      initiation of antibiotics    Strongly encourage escalation           of antibiotics                                     -----------------------------                                            PCT <= 0.25 ng/mL                                                 OR                                        >  80% decrease in PCT                                     Discontinue / Do not initiate                                             antibiotics Performed at [facility], [place] [address], [place], [place] [address]   Troponin I - Now Then Q6H     Status: Abnormal   Collection Time: 05/12/2018 10:02 PM  Result Value Ref Range   Troponin I 0.42 (HH) <0.03 ng/mL    Comment: CRITICAL RESULT CALLED TO, READ BACK BY AND VERIFIED WITH: [facility] RN 05/23/2018 2259 JORDANS Performed at [facility], [place] [address], [place], [place] [address]   POCT I-Stat EG7     Status: Abnormal   Collection Time: 06/02/2018 10:11 PM  Result Value Ref Range   pH, Ven 7.376 7.250 - 7.430   pCO2, Ven 38.3 (L) 44.0 - 60.0 mmHg   pO2, Ven 35.0 32.0 - 45.0 mmHg   Bicarbonate 22.7 20.0 - 28.0 mmol/L   TCO2 24 22 - 32 mmol/L   O2 Saturation 70.0 %   Acid-base deficit 3.0 (H) 0.0 - 2.0 mmol/L   Sodium 130 (L) 135 - 145 mmol/L   Potassium 4.1 3.5 - 5.1 mmol/L   Calcium, Ion 1.16 1.15 - 1.40 mmol/L   HCT 45.0 39.0 - 52.0 %   Hemoglobin 15.3 13.0 - 17.0 g/dL   Patient temperature 96.5 F    Sample type VENOUS    Comment NOTIFIED PHYSICIAN   Osmolality     Status: None   Collection Time: 05/24/2018 10:25 PM  Result Value Ref Range   Osmolality 293 275 - 295 mOsm/kg    Comment: Performed at [facility], [place] [address] [place], [place] [address]  Glucose, capillary     Status: Abnormal   Collection Time: 05/12/18 12:24 AM  Result Value Ref Range   Glucose-Capillary 136 (H) 70 - 99 mg/dL    ECG   [name]  Telemetry   Afib - Personally Reviewed  Radiology    Ct Head Wo Contrast  Result Date: 06/02/2018 CLINICAL DATA:  Increasing weakness, poor appetite EXAM: CT HEAD WITHOUT CONTRAST TECHNIQUE: Contiguous axial images were obtained from the base of the skull through the vertex  without intravenous contrast. COMPARISON:  CT brain 02/07/2015, MRI 05/01/2013 FINDINGS: Brain: No acute territorial infarction, hemorrhage, or intracranial mass. Chronic lacunar infarct in the right thalamus. Chronic infarct in the left cerebellum. Moderate small vessel ischemic changes of the white matter. Moderate atrophy. Stable ventricle size. Vascular: No hyperdense vessels. Vertebral and carotid vascular calcification Skull: No fracture Sinuses/Orbits: Mucosal thickening in the ethmoid sinuses Other: None IMPRESSION: 1. No CT evidence for acute intracranial abnormality. 2. Atrophy and small vessel ischemic changes of the white matter. Chronic infarcts in the right thalamus and left cerebellum. Electronically Signed   By: Donavan Foil M.D.   On: 05/16/2018 19:57   Dg Chest Portable 1 View  Result Date: 05/17/2018 CLINICAL DATA:  Cough and fall EXAM: PORTABLE CHEST 1 VIEW COMPARISON:  04/21/2018 FINDINGS: The heart size and mediastinal contours are within normal limits. There is bibasilar atelectasis without consolidation. The visualized skeletal structures  are unremarkable. Unchanged position of left chest wall pacemaker leads. Remote median sternotomy and CABG. IMPRESSION: No active disease. Electronically Signed   By: Ulyses Jarred M.D.   On: 05/09/2018 18:43    Cardiac Studies   Deferred  Assessment   Active Problems:   Atrial fibrillation (HCC)   HTN (hypertension)   Hypothyroidism   Long term current use of anticoagulant therapy   Sleep apnea   Diabetes mellitus, type 2, with circulatory disorder (HCC)   Dyslipidemia   GERD (gastroesophageal reflux disease)   Hyponatremia   Chronic systolic congestive heart failure (HCC)   DM (diabetes mellitus), type 2 with renal complications (HCC)   CKD (chronic kidney disease) stage 2, GFR 60-89 ml/min   Generalized weakness   History of sleep apnea   Acute kidney injury superimposed on chronic kidney disease ([place])   SIRS (systemic  inflammatory response syndrome) ([place])   Demand ischemia ([place])   Plan   1. Quick decline over the past 24 hours - may have been prerenal, on IV fluids, but does not want blood draws, further testing - DNR/DNI, may pursue comfort. Palliative care consult pending. Would try to avoid additional benzos to assess mental status and willingness to eat or drink.  Time Spent Directly with Patient:  I have spent a total of 25 minutes with the patient reviewing hospital notes, telemetry, EKGs, labs and examining the patient as well as establishing an assessment and plan that was discussed personally with the patient.  > 50% of time was spent in direct patient care.  Length of Stay:  LOS: 0 days   Pixie Casino, MD, [facility], [place] Director of the Advanced Lipid Disorders &  Cardiovascular Risk Reduction Clinic Diplomate of the American Board of Clinical Lipidology Attending Cardiologist  Direct Dial: [phone]  Fax: [phone]  Website:  [email or web address] 05/12/2018, 12:34 PM

## 2018-05-12 NOTE — Progress Notes (Signed)
Pharmacy Antibiotic Note  Jon [name] is a 85 y.o. male admitted on 06/03/2018 with sepsis.  Pharmacy has been consulted for Unasyn dosing for aspiration PNA. Patient is afebrile and WBC is normal. Per MD, suspicion for infection is very low.  Plan: Unasyn 1.5g IV q12h Follow up clinical improvement, renal function, goals of care  Temp (24hrs), Avg:96.9 F (36.1 C), Min:96.5 F (35.8 C), Max:97.3 F (36.3 C)  Recent Labs  Lab 06/02/2018 1809  WBC 7.9  CREATININE 2.42*  LATICACIDVEN 2.3*    Estimated Creatinine Clearance: 18.1 mL/min (A) (by C-G formula based on SCr of 2.42 mg/dL (H)).    Allergies  Allergen Reactions  . Quinolones Other (See Comments)    Risk of rupture of AAA  . Caduet [Amlodipine-Atorvastatin] Other (See Comments)    Weakness   . Crestor [Rosuvastatin] Other (See Comments)    Muscle pain/weakness  . Lipitor [Atorvastatin Calcium] Other (See Comments)    Muscle pain/weakness  . Simvastatin Other (See Comments)    Muscle pain/weakness  . Norvasc [Amlodipine] Other (See Comments)    "Allergic," per [facility]  . Pollen Extract Other (See Comments)    And other "air pollutants," per [facility] "Allergic"  . Lisinopril Other (See Comments)    "Allergic," per [facility]   Antimicrobials This Admission: Vanc x1 CTX x 1 3/6 Flagyl x2 3/7 Cefepime x1 3/7 Unasyn 3/7 >>  Microbiology Results: 3/6 Bcx: NGTD 3/6 Ucx: In process   Thank you for allowing pharmacy to be a part of this patient's care.  Jackson Latino, PharmD PGY1 Pharmacy Resident Phone [phone] 05/12/2018     9:34 AM

## 2018-05-12 NOTE — Progress Notes (Signed)
PROGRESS NOTE                                                                                                                                                                                                             Patient Demographics:    Iain Sawchuk, is a 85 y.o. male, DOB - 1921-11-17, PJK:[id number]  Admit date - 06/03/2018   Admitting Physician Toy Baker, MD  Outpatient Primary MD for the patient is Mast, Man X, NP  LOS - 0  Chief Complaint  Patient presents with  . Weakness  . Abnormal Lab       Brief Narrative   Jace Fermin is a 85 y.o. male with medical history significant of aspiration pneumonia paroxysmal atrial fibrillation, Systolic CHF, CKD< CAD, sp VA, DM 2, HTN , HLD, presented with 2-week history of gradual fatigue, decreased oral intake along with some shortness of breath and requiring oxygen.  In the ER he was found to be dehydrated, hypotensive, ARF,   Subjective:    Ryker [place] today is sleeping.   Assessment  & Plan :    Active Problems:   Atrial fibrillation (HCC)   HTN (hypertension)   Hypothyroidism   Long term current use of anticoagulant therapy   Sleep apnea   Diabetes mellitus, type 2, with circulatory disorder (HCC)   Dyslipidemia   GERD (gastroesophageal reflux disease)   Hyponatremia   Chronic systolic congestive heart failure (HCC)   DM (diabetes mellitus), type 2 with renal complications (HCC)   CKD (chronic kidney disease) stage 2, GFR 60-89 ml/min   Generalized weakness   History of sleep apnea   Acute kidney injury superimposed on chronic kidney disease (HCC)   SIRS (systemic inflammatory response syndrome) ([place])   1.  Terminal decline in a 85 year old patient with history of 2 diastolic CHF last EF 80%, hypertension, dyslipidemia, now dehydration and acute renal failure due to reduced oral intake and fatigue which is ongoing for 2 weeks.  He had clearly expressed his desire in the ER to be DNR, he wanted  no further lab work or work-up, he wanted his care to be directed towards comfort, at this time I suspicions for infection is very low, I will taper his antibiotics to Unasyn, gentle IV fluids, no further lab draws as per his wishes or diagnostic testing.  Gentle hydration.  If he declines will focus on comfort care.  Palliative care will be consulted.  Plan was discussed with patient's daughter bedside.  No further diagnostic tests will be done.   Other medical problems that patient has DM type II, CKD stage II, dyslipidemia, GERD, hypothyroidism, essential hypertension and paroxysmal atrial fibrillation.  He is on Eliquis but at this point it will be held as well.    Family Communication  :  daughter  Code Status :  DNR  Disposition Plan  :  TBD  Consults  :  Pall.Care  Procedures  :     DVT Prophylaxis  :  None  Lab Results  Component Value Date   PLT 223 06/04/2018    Diet :  Diet Order            Diet regular Room service appropriate? Yes; Fluid consistency: Thin  Diet effective now               Inpatient Medications Scheduled Meds: . insulin aspart  0-9 Units Subcutaneous Q4H  . levothyroxine  125 mcg Oral Q0600  . tobramycin-dexamethasone  1 application Both Eyes BID   Continuous Infusions: . sodium chloride     PRN Meds:.acetaminophen **OR** acetaminophen, albuterol, HYDROcodone-acetaminophen, ipratropium-albuterol, LORazepam, meclizine, morphine injection, [DISCONTINUED] ondansetron **OR** ondansetron (ZOFRAN) IV  Antibiotics  :   Anti-infectives (From admission, onward)   Start     Dose/Rate Route Frequency Ordered Stop   05/12/18 0000  metroNIDAZOLE (FLAGYL) IVPB 500 mg  Status:  Discontinued     500 mg 100 mL/hr over 60 Minutes Intravenous Every 8 hours 05/12/2018 2345 05/12/18 0912   05/12/18 0000  ceFEPIme (MAXIPIME) 1 g in sodium chloride 0.9 % 100 mL IVPB  Status:  Discontinued     1 g 200 mL/hr over 30 Minutes Intravenous Every 24 hours 05/10/2018  2350 05/12/18 0912   05/14/2018 2145  vancomycin (VANCOCIN) IVPB 1000 mg/200 mL premix  Status:  Discontinued     1,000 mg 200 mL/hr over 60 Minutes Intravenous  Once 05/14/2018 2135 05/06/2018 2137   05/09/2018 2145  vancomycin (VANCOCIN) 1,500 mg in sodium chloride 0.9 % 500 mL IVPB     1,500 mg 250 mL/hr over 120 Minutes Intravenous  Once 05/10/2018 2137 05/12/18 0120   05/18/2018 2141  vancomycin variable dose per unstable renal function (pharmacist dosing)  Status:  Discontinued      Does not apply See admin instructions 05/26/2018 2141 05/12/18 0912   05/08/2018 2015  cefTRIAXone (ROCEPHIN) 1 g in sodium chloride 0.9 % 100 mL IVPB     1 g 200 mL/hr over 30 Minutes Intravenous  Once 06/03/2018 2008 05/07/2018 2310          Objective:   Vitals:   05/30/2018 2330 05/12/18 0018 05/12/18 0400 05/12/18 0611  BP: (!) 80/54 96/66  103/67  Pulse: 60 63  60  Resp: 18 16  20   Temp:    (!) 97.3 F (36.3 C)  TempSrc:    Oral  SpO2: 93% 98%  98%  Weight:  77.1 kg 77.1 kg   Height:  5\' 8"  (1.727 m) 5\' 8"  (1.727 m)     Wt Readings from Last 3 Encounters:  05/12/18 77.1 kg  05/09/18 71.3 kg  05/04/18 71.3 kg     Intake/Output Summary (Last 24 hours) at 05/12/2018 0914 Last data filed at 05/12/2018 0813 Gross per 24 hour  Intake 1300 ml  Output -  Net 1300 ml     Physical Exam  Somnolent but resting comfortably [place].AT,PERRAL Supple Neck,No JVD, No cervical lymphadenopathy appriciated.  Symmetrical Chest wall movement, Good air movement  bilaterally, CTAB RRR,No Gallops,Rubs or new Murmurs, No Parasternal Heave +ve B.Sounds, Abd Soft, No tenderness, No organomegaly appriciated, No rebound - guarding or rigidity. No Cyanosis, Clubbing or edema, No new Rash or bruise       Data Review:    CBC Recent Labs  Lab 05/19/2018 1809 05/25/2018 2211  WBC 7.9  --   HGB 15.5 15.3  HCT 48.4 45.0  PLT 223  --   MCV 96.4  --   MCH 30.9  --   MCHC 32.0  --   RDW 15.8*  --   LYMPHSABS 1.3  --   MONOABS  1.0  --   EOSABS 0.1  --   BASOSABS 0.0  --     Chemistries  Recent Labs  Lab 06/05/2018 1809 05/21/2018 2211  NA 131* 130*  K 4.7 4.1  CL 97*  --   CO2 21*  --   GLUCOSE 120*  --   BUN 76*  --   CREATININE 2.42*  --   CALCIUM 9.0  --   AST 33  --   ALT 19  --   ALKPHOS 94  --   BILITOT 1.2  --    ------------------------------------------------------------------------------------------------------------------ No results for input(s): CHOL, HDL, LDLCALC, TRIG, CHOLHDL, LDLDIRECT in the last 72 hours.  Lab Results  Component Value Date   HGBA1C 6.9 04/25/2017   ------------------------------------------------------------------------------------------------------------------ Recent Labs    05/21/2018 1809  TSH 3.562   ------------------------------------------------------------------------------------------------------------------ No results for input(s): VITAMINB12, FOLATE, FERRITIN, TIBC, IRON, RETICCTPCT in the last 72 hours.  Coagulation profile No results for input(s): INR, PROTIME in the last 168 hours.  No results for input(s): DDIMER in the last 72 hours.  Cardiac Enzymes Recent Labs  Lab 05/22/2018 2202  TROPONINI 0.42*   ------------------------------------------------------------------------------------------------------------------    Component Value Date/Time   BNP 574.0 (H) 05/15/2018 1809    Micro Results Recent Results (from the past 240 hour(s))  Blood Culture (routine x 2)     Status: None (Preliminary result)   Collection Time: 05/25/2018  9:50 PM  Result Value Ref Range Status   Specimen Description BLOOD RIGHT FOREARM  Final   Special Requests   Final    BOTTLES DRAWN AEROBIC AND ANAEROBIC Blood Culture adequate volume   Culture   Final    NO GROWTH < 12 HOURS Performed at [facility], [place] [address], [place], [place] [address]    Report Status PENDING  Incomplete  Blood Culture (routine x 2)     Status: None (Preliminary result)    Collection Time: 05/28/2018  9:50 PM  Result Value Ref Range Status   Specimen Description BLOOD RIGHT HAND  Final   Special Requests   Final    BOTTLES DRAWN AEROBIC AND ANAEROBIC Blood Culture results may not be optimal due to an inadequate volume of blood received in culture bottles   Culture   Final    NO GROWTH < 12 HOURS Performed at [facility], [place] [address] [place], [place] [address]    Report Status PENDING  Incomplete    Radiology Reports  Dg Chest 2 View  Result Date: 04/21/2018 CLINICAL DATA:  RIGHT lower quadrant abdominal pain EXAM: CHEST - 2 VIEW COMPARISON:  04/30/2013 FINDINGS: Sternotomy wires overlie normal cardiac silhouette. Normal pulmonary vasculature. No effusion, infiltrate, or pneumothorax. No acute osseous abnormality. LEFT-sided pacemaker. IMPRESSION: No acute cardiopulmonary process. Electronically Signed   By: Suzy Bouchard M.D.   On: 04/21/2018 17:18  Ct Head Wo Contrast  Result Date: 05/20/2018 CLINICAL DATA:  Increasing weakness, poor appetite EXAM: CT HEAD WITHOUT CONTRAST TECHNIQUE: Contiguous axial images were obtained from the base of the skull through the vertex without intravenous contrast. COMPARISON:  CT brain 02/07/2015, MRI 05/01/2013 FINDINGS: Brain: No acute territorial infarction, hemorrhage, or intracranial mass. Chronic lacunar infarct in the right thalamus. Chronic infarct in the left cerebellum. Moderate small vessel ischemic changes of the white matter. Moderate atrophy. Stable ventricle size. Vascular: No hyperdense vessels. Vertebral and carotid vascular calcification Skull: No fracture Sinuses/Orbits: Mucosal thickening in the ethmoid sinuses Other: None IMPRESSION: 1. No CT evidence for acute intracranial abnormality. 2. Atrophy and small vessel ischemic changes of the white matter. Chronic infarcts in the right thalamus and left cerebellum. Electronically Signed   By: Donavan Foil M.D.   On: 05/26/2018 19:57   Ct Abdomen Pelvis  W Contrast  Result Date: 04/21/2018 CLINICAL DATA:  Abdominal pain for 1 week EXAM: CT ABDOMEN AND PELVIS WITH CONTRAST TECHNIQUE: Multidetector CT imaging of the abdomen and pelvis was performed using the standard protocol following bolus administration of intravenous contrast. CONTRAST:  128mL OMNIPAQUE IOHEXOL 300 MG/ML  SOLN COMPARISON:  Lumbar CT 06/25/2014 FINDINGS: Lower chest: Mild bronchiectasis at the lung bases. Hepatobiliary: No focal hepatic lesion.  Gallbladder normal. Pancreas: Several cystic appearing lesions in the pancreas. One in the pancreatic head measures 14 mm (image 30/3). On towards the tail measures 10 mm (image 22/3). No duct dilatation. The pancreas is mildly at atrophic which is likely related to patient's age. Spleen: Normal spleen Adrenals/urinary tract: Adrenal glands and kidneys are normal. The ureters and bladder normal. Stomach/Bowel: Stomach, small bowel, appendix, and cecum are normal. The colon and rectosigmoid colon are normal. Vascular/Lymphatic: Infrarenal abdominal aorta contains a saccular aneurysm measuring 45 mm in greatest dimension seen on sagittal image 63/7. Aneurysm measures 40 mm in transverse dimension. Heavy intimal calcification of the abdominal aorta. No lymphadenopathy Reproductive: Prostate normal Other: Small LEFT inguinal hernia is fat filled Musculoskeletal: No acute osseous abnormality. Compression deformity at T12 is chronic. Smudgy scan sclerotic lesions in the sacrum appear symmetric side-to-side. Favored benign. IMPRESSION: 1. No acute findings in the abdomen pelvis. 2. 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 Recommend followup by abdomen and pelvis CTA in 6 months, and vascular surgery referral/consultation if not already obtained. This recommendation follows ACR consensus guidelines: White Paper of the ACR Incidental Findings Committee II on Vascular Findings. J Am Coll Radiol 2013; 10:789-794. 3. Several cystic lesions in the  pancreas. No pancreatic mass or duct dilatation. Favor benign cystic change. No specific follow-up recommended in this age group. 4. Mild lung base bronchiectasis. 5. Chronic compression fracture T12 Electronically Signed   By: Suzy Bouchard M.D.   On: 04/21/2018 19:44   Dg Chest Portable 1 View  Result Date: 06/02/2018 CLINICAL DATA:  Cough and fall EXAM: PORTABLE CHEST 1 VIEW COMPARISON:  04/21/2018 FINDINGS: The heart size and mediastinal contours are within normal limits. There is bibasilar atelectasis without consolidation. The visualized skeletal structures are unremarkable. Unchanged position of left chest wall pacemaker leads. Remote median sternotomy and CABG. IMPRESSION: No active disease. Electronically Signed   By: Ulyses Jarred M.D.   On: 06/01/2018 18:43    Time Spent in minutes  30   Lala Lund M.D on 05/12/2018 at 9:14 AM  To page go to [email or web address] - password [facility]

## 2018-05-12 NOTE — Progress Notes (Signed)
Pt.  Daughter came up with pt.  Looks like patient and family are leaning toward comfort care.  Wanted to discontinue morning labs and CBGs.  Also did not want to have patient hooked up to tele.  They just want him to be comfortable.

## 2018-05-12 NOTE — Consult Note (Signed)
Consultation Note Date: 05/12/2018   Patient Name: Jon [name]  DOB: May 16, 1921  MRN: [id number]  Age / Sex: 85 y.o., male  PCP: Mast, Man X, NP Referring Physician: Thurnell Lose, MD  Reason for Consultation: Establishing goals of care and Psychosocial/spiritual support  HPI/Patient Profile: 85 y.o. male  with past medical history of atrial fib, systolic heart failure, chronic kidney disease, coronary artery disease status post stenting, history of bradycardia status post pacemaker, hyperlipidemia, hypertension, diabetes type 2, CVA 2014 admitted on 05/15/2018 with altered mental status.  Family describes a sharp decline over the previous 24 hours as exhibited by decreased p.o. intake, increased shortness of breath.  Upon presentation to the emergency room patient was found to have acute kidney injury and met sepsis criteria with hypotension, BP 75/51 and hypothermia.  Lactic acid upon admission was 2.3.  Creatinine 2.42, BNP 574.  Family elected IV fluids and antibiotics in an attempt to treat the treatable but have not wanted to pursue an overly aggressive work-up including no lab draws, glucose monitoring.  Consult ordered for goals of care  Clinical Assessment and Goals of Care: Patient seen, chart reviewed.  Patient has multiple family members at the bedside including 1 of his 3 children, Izora Gala and her husband Chip.  Daughter Lesleigh Noe, son Deng were not present for today's goals of care but they have been speaking to the physician.  Izora Gala shares that she concurs with IV fluids antibiotics and trying to treat the treatable.  Patient is not full comfort care.  He is  somnolent after receiving Ativan.  We did discuss the ongoing difficulty with ascertaining what is drug effect versus disease progression.  We talked about working diagnosis of sepsis and what that could look like in terms of AMS.  Patient has  had aspiration pneumonia in the past  Reviewed patient's labs and imaging with Izora Gala and Chip  Patient is widowed, his wife died  5 years ago.  He lives at [employer] [facility] in assisted living and has a significant other, Jan.  Izora Gala is still struggling after the death of her mother and wants to make sure that she is "done everything she can do for her father but respect his wishes".  Izora Gala states her Sister Lesleigh Noe has power of attorney but she is not sure who has healthcare power of attorney.  Regardless, they are all making decisions collectively as a family  Introduced hospice services.  Also compared and contrasted hospice services with palliative medicine.  Patient probably would qualify for hospice services certainly within the facility but may even be residential appropriate if he is not able to rebound    SUMMARY OF RECOMMENDATIONS   Family confirmed DNR/DNI Family confirmed continue with IV fluids and antibiotics, hopeful watchful waiting over the next 24 to 48 hours They are hoping that he can return to her friend's home [place] either to back to assisted living but more likely to skilled nursing Per patient request (as per daughter Izora Gala), will discontinue blood sugar  checks and CBGs but orders remain in place to treat for hypoglycemia if suspected Family meeting with daughter Raiquan, [place], and Izora Gala scheduled for May 19, 2018 at 44 Code Status/Advance Care Planning:  DNR    Symptom Management:  Anxiety: Patient has a history of anxiety and depression.  He is not drug nave in terms of benzodiazepines but they have been quite sedating in the context of illness.  Will adjust dosing as well as interval in hopes of managing symptoms without undue sedation Pain: Again we will adjust medications to provide relief of symptoms without undue sedation. Please see MAR Palliative Prophylaxis:   Aspiration, Bowel Regimen, Delirium Protocol, Eye Care, Frequent Pain Assessment,  Oral Care and Turn Reposition  Additional Recommendations (Limitations, Scope, Preferences):  Minimize Medications, Initiate Comfort Feeding, No Artificial Feeding, No Blood Transfusions, No Chemotherapy, No Glucose Monitoring, No Hemodialysis, No Lab Draws, No Radiation, No Surgical Procedures and No Tracheostomy  Psycho-social/Spiritual:   Desire for further Chaplaincy support:no  Additional Recommendations: Referral to Community Resources   Prognosis:   Unable to determine  Discharge Planning: To Be Determined      Primary Diagnoses: Present on Admission: . SIRS (systemic inflammatory response syndrome) (HCC) . Atrial fibrillation ([place]) . HTN (hypertension) . Hypothyroidism . Sleep apnea . Diabetes mellitus, type 2, with circulatory disorder ([place]) . Dyslipidemia . Chronic systolic congestive heart failure ([place]) . DM (diabetes mellitus), type 2 with renal complications ([place]) . CKD (chronic kidney disease) stage 2, GFR 60-89 ml/min . GERD (gastroesophageal reflux disease) . Hyponatremia . Acute kidney injury superimposed on chronic kidney disease ([place])   I have reviewed the medical record, interviewed the patient and family, and examined the patient. The following aspects are pertinent.  Past Medical History:  Diagnosis Date  . Abnormal PFTs 10/30/2012   Followed in Pulmonary clinic/ [facility]/ [place]  - PFT's 10/22/12 VC  55% no obst, DLCO 50%  - PFT's 12/20/2012 VC 83% and no obst, dLCO 55%  -11/08/2012  Walked RA x 3 laps @ 185 ft each stopped due to  End of study, not desat -11/08/12 esr 10    . Abnormality of gait 04/18/2013  . Acute on chronic systolic congestive heart failure, NYHA class 2 ([place]) 03/16/2006   BNP 376.9 05/28/13   . Adjustment disorder with mixed anxiety and depressed mood 04/18/2013   Post CVA depression and anxiety   . Allergy   . Anxiety   . Aortic aneurysm of unspecified site without mention of rupture 10/27/2011  . Arthritis   .  Atherosclerosis of renal artery ([place]) 10/18/2006  . Atrial fibrillation ([place]) 03/04/2011    He was on coumadin until his stroke last December and was switched to apixaban 2.'5mg'$  daily which he has been taking faithfully without reported side effects.    . Balance disorder 07/31/2014  . Basal cell carcinoma of skin of other and unspecified parts of face 12/24/2009  . Blood transfusion without reported diagnosis   . Cardiac pacemaker in situ- MDT 10/11 03/18/2010   Medtronic revo implant in October 2011. Severe sinus bradycardia in the 30s, but not truly pacemaker dependent   . Cataract   . CKD stage 2 due to type 2 diabetes mellitus ([place]) 07/03/2014  . Coronary artery disease   . CVA - Rt brain stroke 12/14 02/18/2013   02/19/13 angiography CT head:  Diffuse atherosclerotic irregularity and plaque formation of the distal right common carotid artery and proximal internal carotid artery without significant stenosis. Plaque ulceration is present and  is a source of emboli. A small right thalamic CVA    . DM (diabetes mellitus), type 2 with renal complications ([place]) 9/50/9326  . Dyslipidemia 11/03/2004  . Fall 03/04/2011  . Fatigue 04/18/2013  . Gout, unspecified 11/03/2004  . Heart murmur   . History of abdominal aortic aneurysm   . History of Doppler ultrasound 05/22/2012   LEAs; R anterior tibial artery appeared occluded; L posterior tibial shows short segment of occlusive ds  . History of Doppler ultrasound 05/22/2012   Abdominal Aortic Doppler; slight increase in fusiform aneurysm   . History of echocardiogram 12/2008   EF >55%; mild concentric LVH; mild MR; mild-mod TR; mild AV regurg;   . History of nuclear stress test 12/2010   lexiscan; low risk; compared to prior study, perfusion improved  . HTN (hypertension) 03/04/2011  . Hyperlipidemia   . Hypertension   . Hypertrophy of prostate with urinary obstruction and other lower urinary tract symptoms (LUTS) 04/28/2004  . Hyponatremia 03/04/2011    . Hypothyroidism 03/04/2011  . Impotence of organic origin 10/03/2000  . Internal hemorrhoids without mention of complication 71/24/5809  . Long term (current) use of anticoagulants 03/17/2011  . Major depressive disorder, single episode, unspecified 03/05/2009  . Memory change 01/24/2013  . Muscle weakness (generalized) 03/10/2011  . Nocturia 10/27/2011  . Obstructive sleep apnea (adult) (pediatric) 07/23/2008  . Open wound of knee, leg (except thigh), and ankle, complicated 9/83/3825  . Osteoarthritis of both knees 07/31/2014  . Osteoarthrosis, unspecified whether generalized or localized, unspecified site 02/05/2003  . PAF (paroxysmal atrial fibrillation) (HCC)    coumadin  . Pain in joint, lower leg 08/04/2011  . Pruritic condition 01/24/2013  . Quadriceps weakness 07/31/2014  . Right bundle branch block 04/19/2006  . 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 Spinal stenosis in cervical region 12/15/2004  . SSS (sick sinus syndrome) ([place]) 04/08/2014  . Stroke ([place])   . Thyroid disease   . TIA (transient ischemic attack) 04/30/2013  . Type II or unspecified type diabetes mellitus without mention of complication, not stated as uncontrolled 11/24/2004  . Unspecified vitamin D deficiency 10/18/2006  . 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 Increased exposure of the right cornea.    Social History   Socioeconomic History  . Marital status: Widowed    Spouse name: Not on file  . Number of children: 3  . Years of education: BA  . Highest education level: Not on file  Occupational History  . Occupation: Retired Land  . Financial resource strain: Not hard at all  . Food insecurity:    Worry: Never true    Inability: Never true  . Transportation needs:    Medical: No    Non-medical: No  Tobacco Use  . Smoking status: Former Smoker    Packs/day: 0.75    Years: 15.00    Pack years: 11.25     Types: Cigarettes    Last attempt to quit: 03/04/1967    Years since quitting: 51.2  . Smokeless tobacco: Never Used  Substance and Sexual Activity  . Alcohol use: No    Alcohol/week: 0.0 standard drinks  . Drug use: No  . Sexual activity: Never  Lifestyle  . Physical activity:    Days per week: 7 days    Minutes per session: 10 min  . Stress: Only a little  Relationships  . Social connections:  Talks on phone: Twice a week    Gets together: Twice a week    Attends religious service: Never    Active member of club or organization: No    Attends meetings of clubs or organizations: Never    Relationship status: Widowed  Other Topics Concern  . Not on file  Social History Narrative   Lives at [facility] since 03/2009, moved to [place] 04/2013   Widowed    Stopped smoking 1968   Alcohol none   Exercise  none   Walks with walker   Living Will, POA         Family History  Problem Relation Age of Onset  . Diabetes Father   . Other Mother        PAD with amputations   Scheduled Meds: . levothyroxine  125 mcg Oral Q0600  . tobramycin-dexamethasone  1 application Both Eyes BID   Continuous Infusions: . sodium chloride 50 mL/hr at 05/12/18 1211  . ampicillin-sulbactam (UNASYN) IV 1.5 g (05/12/18 1000)   PRN Meds:.acetaminophen **OR** acetaminophen, albuterol, HYDROcodone-acetaminophen, ipratropium-albuterol, LORazepam, meclizine, morphine injection, [DISCONTINUED] ondansetron **OR** ondansetron (ZOFRAN) IV Medications Prior to Admission:  Prior to Admission medications   Medication Sig Start Date End Date Taking? Authorizing Provider  acetaminophen (TYLENOL) 325 MG tablet Take 650 mg by mouth every 4 (four) hours as needed for mild pain or headache (NOT TO EXCEED 3,000 mg/24 hours).   Yes [provider]  allopurinol (ZYLOPRIM) 100 MG tablet Take 50 mg by mouth daily.    Yes [provider]  ALPRAZolam (XANAX) 0.25 MG tablet Take 1 tablet (0.25 mg  total) by mouth 2 (two) times daily as needed for anxiety. 04/20/18  Yes Mast, Man X, NP  amoxicillin (AMOXIL) 500 MG tablet Take 2,000 mg by mouth See admin instructions. Take 2,000 mg by mouth 1 hour prior to dental appointments   Yes [provider]  apixaban (ELIQUIS) 2.5 MG TABS tablet Take 1 tablet (2.5 mg total) by mouth 2 (two) times daily. 10/22/13  Yes Hilty, Nadean Corwin, MD  calcium carbonate (TUMS - DOSED IN MG ELEMENTAL CALCIUM) 500 MG chewable tablet Chew 1 tablet by mouth 3 (three) times daily as needed for indigestion or heartburn.    Yes [provider]  Carboxymethylcellulose Sodium (THERATEARS OP) Place 1 drop into both eyes daily as needed (DRY EYES).    Yes [provider]  Cholecalciferol (VITAMIN D3) 50 MCG (2000 UT) TABS Take 2,000 Units by mouth daily.   Yes [provider]  furosemide (LASIX) 20 MG tablet Take 20 mg by mouth daily as needed (for a weight gain >3 pounds/24 hours or 5 pounds/week).    Yes [provider]  ipratropium (ATROVENT) 0.06 % nasal spray Place 1 spray into both nostrils 3 (three) times daily as needed for rhinitis.   Yes [provider]  ipratropium-albuterol (DUONEB) 0.5-2.5 (3) MG/3ML SOLN Take 3 mLs by nebulization 4 (four) times daily as needed (SHORTNESS OF BREATH).    Yes [provider]  levothyroxine (SYNTHROID, LEVOTHROID) 125 MCG tablet Take 125 mcg by mouth daily before breakfast.   Yes [provider]  losartan (COZAAR) 25 MG tablet Take 1 tablet (25 mg total) by mouth daily. 11/03/15  Yes Hilty, Nadean Corwin, MD  meclizine (ANTIVERT) 25 MG tablet Take 25 mg by mouth every 6 (six) hours as needed for dizziness.    Yes [provider]  Menthol, Topical Analgesic, (BIOFREEZE) 4 % GEL Apply  1 application topically 4 (four) times daily as needed (left knee pain).    Yes [provider]  metoprolol tartrate (LOPRESSOR) 25 MG tablet Take 1 tablet (25 mg total) by  mouth 2 (two) times daily. 08/10/16  Yes Croitoru, Mihai, MD  neomycin-bacitracin-polymyxin (NEOSPORIN) [phone] ointment Apply 1 application topically daily as needed (both nares for dryness).    Yes [provider]  nitroGLYCERIN (NITROSTAT) 0.4 MG SL tablet Place 0.4 mg under the tongue every 5 (five) minutes x 3 doses as needed for chest pain.    Yes [provider]  polyethylene glycol (MIRALAX / GLYCOLAX) packet Take 17 g by mouth daily.   Yes [provider]  tobramycin-dexamethasone (TOBRADEX) ophthalmic ointment Place 1 application into both eyes 2 (two) times daily.   Yes [provider]  triamcinolone cream (KENALOG) 0.1 % Apply 1 application topically daily. GROIN AREA   Yes [provider]  White Petrolatum-Mineral Oil (RETAINE PM) OINT Place 1 application into both eyes as directed.   Yes [provider]  Dextromethorphan-guaiFENesin (ROBAFEN DM) 10-100 MG/5ML liquid Give Robafen DM syrup 10 mL by mouth every 6 hours PRN for cough X 48 hours (Use sugar-free for diabetic patients.) Notify MD of continued cough.    [provider]  Humidifiers ([employer] COOL MIST HUMIDIFER) MISC by Does not apply route. RES. STATES DOES NOT USE HUMIDIFIER IN THE SUMMER MONTHS/REQUEST THIS ORDER CHANGED TO PRN: ONCE HUMIDIFIER IS BACK IN USE, STAFF TO CHECK EVERY SHIFT/FILLED WITH WATER AS NEEDED    [provider]   Allergies  Allergen Reactions  . Quinolones Other (See Comments)    Risk of rupture of AAA  . Caduet [Amlodipine-Atorvastatin] Other (See Comments)    Weakness   . Crestor [Rosuvastatin] Other (See Comments)    Muscle pain/weakness  . Lipitor [Atorvastatin Calcium] Other (See Comments)    Muscle pain/weakness  . Simvastatin Other (See Comments)    Muscle pain/weakness  . Norvasc [Amlodipine] Other (See Comments)    "Allergic," per MAR  . Pollen Extract Other (See Comments)    And other "air pollutants," per [facility]  "Allergic"  . Lisinopril Other (See Comments)    "Allergic," per MAR   Review of Systems  Unable to perform ROS: Mental status change    Physical Exam Vitals signs and nursing note reviewed.  Constitutional:      Comments: Elderly man who appears ill; somnolent  HENT:     Head: Normocephalic and atraumatic.  Pulmonary:     Effort: Pulmonary effort is normal.  Neurological:     Comments: Patient is somnolent.  Unable to test orientation.  He does smile at visitors in his room and seems to recognize family  Psychiatric:     Comments: No overt agitation or anxiety observed otherwise unable to test mental status      Vital Signs: BP 103/67 (BP Location: Right Arm)   Pulse 60   Temp (!) 97.3 F (36.3 C) (Oral)   Resp 20   Ht '5\' 8"'$  (1.727 m)   Wt 77.1 kg   SpO2 98%   BMI 25.84 kg/m  Pain Scale: 0-10   Pain Score: 0-No pain   SpO2: SpO2: 98 % O2 Device:SpO2: 98 % O2 Flow Rate: .   IO: Intake/output summary:   Intake/Output Summary (Last 24 hours) at 05/12/2018 1709 Last data filed at 05/12/2018 1300 Gross per 24 hour  Intake 1576.99 ml  Output -  Net 1576.99 ml  LBM:   Baseline Weight: Weight: 77.1 kg Most recent weight: Weight: 77.1 kg     Palliative Assessment/Data:   Flowsheet Rows     Most Recent Value  Intake Tab  Referral Department  Hospitalist  Unit at Time of Referral  Med/Surg Unit  Palliative Care Primary Diagnosis  Sepsis/Infectious Disease  Date Notified  05/16/2018  Palliative Care Type  New Palliative care  Reason for referral  Clarify Goals of Care, Counsel Regarding Hospice, Psychosocial or Spiritual support  Date of Admission  05/18/2018  Date first seen by Palliative Care  05/12/18  # of days Palliative referral response time  1 Day(s)  # of days IP prior to Palliative referral  0  Clinical Assessment  Palliative Performance Scale Score  30%  Pain Max last 24 hours  Not able to report  Pain Min Last 24 hours  Not able to report  Dyspnea  Max Last 24 Hours  Not able to report  Dyspnea Min Last 24 hours  Not able to report  Nausea Max Last 24 Hours  Not able to report  Nausea Min Last 24 Hours  Not able to report  Anxiety Max Last 24 Hours  Not able to report  Anxiety Min Last 24 Hours  Not able to report  Other Max Last 24 Hours  Not able to report  Psychosocial & Spiritual Assessment  Palliative Care Outcomes  Patient/Family meeting held?  Yes  Who was at the meeting?  dtr Izora Gala, [address] regarding hospice, Provided psychosocial or spiritual support, Clarified goals of care  Patient/Family wishes: Interventions discontinued/not started   Mechanical Ventilation, Hemodialysis, Transfusion, Vasopressors, Trach, PEG, Tube feedings/TPN  Palliative Care follow-up planned  Yes, Facility      Time In: 1600 Time Out: 1710 Time Total: 70 min Greater than 50%  of this time was spent counseling and coordinating care related to the above assessment and plan.  Signed by: Dory Horn, NP   Please contact Palliative Medicine Team phone at [phone] for questions and concerns.  For individual provider: See Shea Evans

## 2018-05-12 NOTE — Plan of Care (Signed)
Discussed with patient with plan of care for the evening, admission routine and questions with some teach back displayed.

## 2018-05-14 LAB — URINE CULTURE: Culture: 20000 — AB

## 2018-05-15 ENCOUNTER — Encounter: Payer: Medicare Other | Admitting: Vascular Surgery

## 2018-05-16 LAB — CULTURE, BLOOD (ROUTINE X 2)
Culture: NO GROWTH
Culture: NO GROWTH
SPECIAL REQUESTS: ADEQUATE

## 2018-05-22 ENCOUNTER — Ambulatory Visit: Payer: Medicare Other | Admitting: Cardiology

## 2018-06-06 NOTE — Progress Notes (Signed)
I responded to a page from the nurse to provide spiritual support for the patient's family. I visited the patient's room with his two daughters, sons-in-law, and granddaughter present. I shared words of comfort, listened with compassion, and led in prayer. I shared that the Chaplain is available for additional support as needed or requested.    05-25-18 0400  Clinical Encounter Type  Visited With Patient and family together  Visit Type Spiritual support  Referral From Nurse  Consult/Referral To Chaplain  Spiritual Encounters  Spiritual Needs Prayer;Grief support     Chaplain Dr Redgie Grayer

## 2018-06-06 NOTE — Death Summary Note (Signed)
[facility] Death Note                                                                                                                                                                                               Jon [name], is a 85 y.o. male, DOB - April 27, 1921, [id number]  Admit date - 05/15/2018   Admitting Physician Toy Baker, MD  Outpatient Primary MD for the patient is Mast, Man X, NP  LOS - 1  Chief Complaint  Patient presents with  . Weakness  . Abnormal Lab       Notification: Mast, Man X, NP notified of death of 2018-06-03   Date and Time of Death - 06-03-2018 @ 12.46am  Pronounced by - RN  History of present illness:   Jon [name] is a 85 y.o. male with a history of -  aspiration pneumonia paroxysmal atrial fibrillation, Systolic CHF, CKD< CAD, sp VA, DM 2, HTN , HLD, presented with 2-week history of gradual fatigue, decreased oral intake along with some shortness of breath and requiring oxygen.  In the ER he was found to be dehydrated, hypotensive, ARF.   Final Diagnoses:  Cause if death - Dehydration, ARF  Signature  Lala Lund M.D on 2018-06-03 at 5:24 PM  [facility]  Office Phone -[phone]  Total clinical and documentation time for today Under 30 minutes   Last Note               PROGRESS NOTE  Patient Demographics:    Jon [name], is a 85 y.o. male, DOB - [birth year], [id number]  Admit date - 05/10/2018   Admitting Physician Toy Baker, MD  Outpatient Primary MD for the patient is Mast, Man X, NP  LOS - 1  Chief Complaint  Patient presents with  . Weakness  . Abnormal Lab       Brief Narrative   Jon [name] is a 85  y.o. male with medical history significant of aspiration pneumonia paroxysmal atrial fibrillation, Systolic CHF, CKD< CAD, sp VA, DM 2, HTN , HLD, presented with 2-week history of gradual fatigue, decreased oral intake along with some shortness of breath and requiring oxygen.  In the ER he was found to be dehydrated, hypotensive, ARF.   Subjective:    Jon [name] today is sleeping.   Assessment  & Plan :    Active Problems:   Atrial fibrillation (HCC)   HTN (hypertension)   Hypothyroidism   Long term current use of anticoagulant therapy   Sleep apnea   Diabetes mellitus, type 2, with circulatory disorder (HCC)   Dyslipidemia   GERD (gastroesophageal reflux disease)   Hyponatremia   Chronic systolic congestive heart failure (HCC)   DM (diabetes mellitus), type 2 with renal complications (HCC)   CKD (chronic kidney disease) stage 2, GFR 60-89 ml/min   Generalized weakness   History of sleep apnea   Acute kidney injury superimposed on chronic kidney disease ([place])   Sepsis ([place])   Demand ischemia ([place])   Goals of care, counseling/discussion   Palliative care by specialist   1.  Terminal decline in a 85 year old patient with history of 2 diastolic CHF last EF 37%, hypertension, dyslipidemia, now dehydration and acute renal failure due to reduced oral intake and fatigue which is ongoing for 2 weeks.  He had clearly expressed his desire in the ER to be DNR, he wanted no further lab work or work-up, he wanted his care to be directed towards comfort, at this time I suspicions for infection is very low, I will taper his antibiotics to Unasyn, gentle IV fluids, no further lab draws as per his wishes or diagnostic testing.  Gentle hydration.  If he declines will focus on comfort care.  Palliative care will be consulted.  Plan was discussed with patient's daughter bedside.  No further diagnostic tests will be done.   Other medical problems that patient has DM type II, CKD stage II,  dyslipidemia, GERD, hypothyroidism, essential hypertension and paroxysmal atrial fibrillation.  He is on Eliquis but at this point it will be held as well.    Family Communication  :  daughter  Code Status :  DNR  Disposition Plan  :  TBD  Consults  :  Pall.Care  Procedures  :     DVT Prophylaxis  :  None  Lab Results  Component Value Date   PLT 223 05/30/2018    Diet :  Diet Order    None       Inpatient Medications Scheduled Meds:  Continuous Infusions:  PRN Meds:.  Antibiotics  :   Anti-infectives (From admission, onward)   Start     Dose/Rate Route Frequency Ordered Stop   05/12/18 1000  ampicillin-sulbactam (UNASYN) 1.5 g in sodium chloride 0.9 % 100 mL IVPB  Status:  Discontinued     1.5 g 200 mL/hr over 30 Minutes Intravenous Every 12 hours 05/12/18 0932 05/30/2018 0927   05/12/18 0000  metroNIDAZOLE (FLAGYL) IVPB 500 mg  Status:  Discontinued     500 mg 100 mL/hr over 60 Minutes Intravenous Every 8 hours 06/03/2018 2345 05/12/18 0912   05/12/18 0000  ceFEPIme (MAXIPIME) 1 g in sodium chloride 0.9 % 100 mL IVPB  Status:  Discontinued     1 g 200 mL/hr over 30 Minutes Intravenous Every 24 hours 05/10/2018 2350 05/12/18 0912   05/25/2018 2145  vancomycin (VANCOCIN) IVPB 1000 mg/200 mL premix  Status:  Discontinued     1,000 mg 200 mL/hr over 60 Minutes Intravenous  Once 05/23/2018 2135 06/01/2018 2137   05/30/2018 2145  vancomycin (VANCOCIN) 1,500 mg in sodium chloride 0.9 % 500 mL IVPB     1,500 mg 250 mL/hr over 120 Minutes Intravenous  Once 05/07/2018 2137 05/12/18 0120   05/09/2018 2141  vancomycin variable dose per unstable renal function (pharmacist dosing)  Status:  Discontinued      Does not apply See admin instructions 05/30/2018 2141 05/12/18 0912   05/23/2018 2015  cefTRIAXone (ROCEPHIN) 1 g in sodium chloride 0.9 % 100 mL IVPB     1 g 200 mL/hr over 30 Minutes Intravenous  Once 05/24/2018 2008 05/23/2018 2310          Objective:   Vitals:   05/12/18 0400  05/12/18 0611 05/12/18 2133 2018-06-01 0100  BP:  103/67 (!) 129/106   Pulse:  60 92   Resp:  20 18   Temp:  (!) 97.3 F (36.3 C) 97.6 F (36.4 C)   TempSrc:  Oral Oral   SpO2:  98% 99%   Weight: 77.1 kg   77.1 kg  Height: 5\' 8"  (1.727 m)   5\' 8"  (1.727 m)    Wt Readings from Last 3 Encounters:  Jun 01, 2018 77.1 kg  05/09/18 71.3 kg  05/04/18 71.3 kg     Intake/Output Summary (Last 24 hours) at June 01, 2018 1725 Last data filed at 05/12/2018 1800 Gross per 24 hour  Intake 249.97 ml  Output -  Net 249.97 ml     Physical Exam  Somnolent but resting comfortably [place].AT,PERRAL Supple Neck,No JVD, No cervical lymphadenopathy appriciated.  Symmetrical Chest wall movement, Good air movement bilaterally, CTAB RRR,No Gallops,Rubs or new Murmurs, No Parasternal Heave +ve B.Sounds, Abd Soft, No tenderness, No organomegaly appriciated, No rebound - guarding or rigidity. No Cyanosis, Clubbing or edema, No new Rash or bruise       Data Review:    CBC Recent Labs  Lab 05/07/2018 1809 05/09/2018 2211  WBC 7.9  --   HGB 15.5 15.3  HCT 48.4 45.0  PLT 223  --   MCV 96.4  --   MCH 30.9  --   MCHC 32.0  --   RDW 15.8*  --   LYMPHSABS 1.3  --   MONOABS 1.0  --   EOSABS 0.1  --   BASOSABS 0.0  --     Chemistries  Recent Labs  Lab 05/22/2018 1809 05/29/2018 2211  NA 131* 130*  K 4.7 4.1  CL 97*  --   CO2 21*  --   GLUCOSE 120*  --   BUN 76*  --   CREATININE 2.42*  --   CALCIUM 9.0  --   AST 33  --   ALT 19  --   ALKPHOS 94  --   BILITOT 1.2  --    ------------------------------------------------------------------------------------------------------------------ No results for input(s): CHOL, HDL, LDLCALC, TRIG, CHOLHDL, LDLDIRECT in the last 72 hours.  Lab Results  Component Value Date   HGBA1C  6.9 04/25/2017   ------------------------------------------------------------------------------------------------------------------ Recent Labs    05/07/2018 1809  TSH 3.562    ------------------------------------------------------------------------------------------------------------------ No results for input(s): VITAMINB12, FOLATE, FERRITIN, TIBC, IRON, RETICCTPCT in the last 72 hours.  Coagulation profile No results for input(s): INR, PROTIME in the last 168 hours.  No results for input(s): DDIMER in the last 72 hours.  Cardiac Enzymes Recent Labs  Lab 06/01/2018 2202  TROPONINI 0.42*   ------------------------------------------------------------------------------------------------------------------    Component Value Date/Time   BNP 574.0 (H) 05/19/2018 1809    Micro Results Recent Results (from the past 240 hour(s))  Urine culture     Status: Abnormal (Preliminary result)   Collection Time: 06/01/2018  6:19 PM  Result Value Ref Range Status   Specimen Description URINE, CLEAN CATCH  Final   Special Requests NONE  Final   Culture (A)  Final    20,000 COLONIES/mL STAPHYLOCOCCUS SPECIES (COAGULASE NEGATIVE) SUSCEPTIBILITIES TO FOLLOW Performed at [facility], [place] [address] [place], [place] [address]    Report Status PENDING  Incomplete  Blood Culture (routine x 2)     Status: None (Preliminary result)   Collection Time: 05/08/2018  9:50 PM  Result Value Ref Range Status   Specimen Description BLOOD RIGHT FOREARM  Final   Special Requests   Final    BOTTLES DRAWN AEROBIC AND ANAEROBIC Blood Culture adequate volume   Culture   Final    NO GROWTH 2 DAYS Performed at [facility], [place] [address], [place], [place] [address]    Report Status PENDING  Incomplete  Blood Culture (routine x 2)     Status: None (Preliminary result)   Collection Time: 05/08/2018  9:50 PM  Result Value Ref Range Status   Specimen Description BLOOD RIGHT HAND  Final   Special Requests   Final    BOTTLES DRAWN AEROBIC AND ANAEROBIC Blood Culture results may not be optimal due to an inadequate volume of blood received in culture bottles   Culture   Final     NO GROWTH 2 DAYS Performed at [facility], [place] [address] [place], [place] [address]    Report Status PENDING  Incomplete    Radiology Reports  Dg Chest 2 View  Result Date: 04/21/2018 CLINICAL DATA:  RIGHT lower quadrant abdominal pain EXAM: CHEST - 2 VIEW COMPARISON:  04/30/2013 FINDINGS: Sternotomy wires overlie normal cardiac silhouette. Normal pulmonary vasculature. No effusion, infiltrate, or pneumothorax. No acute osseous abnormality. LEFT-sided pacemaker. IMPRESSION: No acute cardiopulmonary process. Electronically Signed   By: Suzy Bouchard M.D.   On: 04/21/2018 17:18   Ct Head Wo Contrast  Result Date: 05/09/2018 CLINICAL DATA:  Increasing weakness, poor appetite EXAM: CT HEAD WITHOUT CONTRAST TECHNIQUE: Contiguous axial images were obtained from the base of the skull through the vertex without intravenous contrast. COMPARISON:  CT brain 02/07/2015, MRI 05/01/2013 FINDINGS: Brain: No acute territorial infarction, hemorrhage, or intracranial mass. Chronic lacunar infarct in the right thalamus. Chronic infarct in the left cerebellum. Moderate small vessel ischemic changes of the white matter. Moderate atrophy. Stable ventricle size. Vascular: No hyperdense vessels. Vertebral and carotid vascular calcification Skull: No fracture Sinuses/Orbits: Mucosal thickening in the ethmoid sinuses Other: None IMPRESSION: 1. No CT evidence for acute intracranial abnormality. 2. Atrophy and small vessel ischemic changes of the white matter. Chronic infarcts in the right thalamus and left cerebellum. Electronically Signed   By: Donavan Foil M.D.   On: 06/02/2018 19:57   Ct Abdomen Pelvis W Contrast  Result Date: 04/21/2018 CLINICAL DATA:  Abdominal pain for 1 week EXAM: CT ABDOMEN AND PELVIS WITH CONTRAST TECHNIQUE: Multidetector CT imaging of the abdomen and pelvis was performed using the standard protocol following bolus administration of intravenous contrast. CONTRAST:  114mL OMNIPAQUE  IOHEXOL 300 MG/ML  SOLN COMPARISON:  Lumbar CT 06/25/2014 FINDINGS: Lower chest: Mild bronchiectasis at the lung bases. Hepatobiliary: No focal hepatic lesion.  Gallbladder normal. Pancreas: Several cystic appearing lesions in the pancreas. One in the pancreatic head measures 14 mm (image 30/3). On towards the tail measures 10 mm (image 22/3). No duct dilatation. The pancreas is mildly at atrophic which is likely related to patient's age. Spleen: Normal spleen Adrenals/urinary tract: Adrenal glands and kidneys are normal. The ureters and bladder normal. Stomach/Bowel: Stomach, small bowel, appendix, and cecum are normal. The colon and rectosigmoid colon are normal. Vascular/Lymphatic: Infrarenal abdominal aorta contains a saccular aneurysm measuring 45 mm in greatest dimension seen on sagittal image 63/7. Aneurysm measures 40 mm in transverse dimension. Heavy intimal calcification of the abdominal aorta. No lymphadenopathy Reproductive: Prostate normal Other: Small LEFT inguinal hernia is fat filled Musculoskeletal: No acute osseous abnormality. Compression deformity at T12 is chronic. Smudgy scan sclerotic lesions in the sacrum appear symmetric side-to-side. Favored benign. IMPRESSION: 1. No acute findings in the abdomen pelvis. 2. 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 Recommend followup by abdomen and pelvis CTA in 6 months, and vascular surgery referral/consultation if not already obtained. This recommendation follows ACR consensus guidelines: White Paper of the ACR Incidental Findings Committee II on Vascular Findings. J Am Coll Radiol 2013; 10:789-794. 3. Several cystic lesions in the pancreas. No pancreatic mass or duct dilatation. Favor benign cystic change. No specific follow-up recommended in this age group. 4. Mild lung base bronchiectasis. 5. Chronic compression fracture T12 Electronically Signed   By: Suzy Bouchard M.D.   On: 04/21/2018 19:44   Dg Chest Portable 1  View  Result Date: 05/19/2018 CLINICAL DATA:  Cough and fall EXAM: PORTABLE CHEST 1 VIEW COMPARISON:  04/21/2018 FINDINGS: The heart size and mediastinal contours are within normal limits. There is bibasilar atelectasis without consolidation. The visualized skeletal structures are unremarkable. Unchanged position of left chest wall pacemaker leads. Remote median sternotomy and CABG. IMPRESSION: No active disease. Electronically Signed   By: Ulyses Jarred M.D.   On: 05/07/2018 18:43    Time Spent in minutes  30   Lala Lund M.D on 2018/06/02 at 5:25 PM  To page go to [email or web address] - password [facility]

## 2018-06-06 NOTE — Progress Notes (Signed)
Patients private aid called me into room saying that he was coughing up some stuff out of his mouth.  When I got to the room I wiped his mouth and saw him take a long deep breath and then he stopped breathing.  I checked for a pulse and listened for breath sounds.  He had neither.  I called Jule Economy RN in to assess him.  He was pronounced at 1246am.

## 2018-06-06 DEATH — deceased

## 2019-02-27 ENCOUNTER — Encounter (INDEPENDENT_AMBULATORY_CARE_PROVIDER_SITE_OTHER): Payer: Medicare Other | Admitting: Ophthalmology
